# Patient Record
Sex: Female | Born: 1975 | Race: White | Hispanic: No | Smoking: Current every day smoker
Health system: Southern US, Community
[De-identification: ages and names within clinical notes are randomized; demographics above are authoritative.]

## PROBLEM LIST (undated history)

## (undated) DIAGNOSIS — G4730 Sleep apnea, unspecified: Secondary | ICD-10-CM

## (undated) DIAGNOSIS — K635 Polyp of colon: Secondary | ICD-10-CM

## (undated) DIAGNOSIS — M129 Arthropathy, unspecified: Secondary | ICD-10-CM

## (undated) DIAGNOSIS — R110 Nausea: Secondary | ICD-10-CM

## (undated) DIAGNOSIS — K648 Other hemorrhoids: Secondary | ICD-10-CM

## (undated) DIAGNOSIS — M5430 Sciatica, unspecified side: Secondary | ICD-10-CM

## (undated) DIAGNOSIS — F419 Anxiety disorder, unspecified: Secondary | ICD-10-CM

## (undated) DIAGNOSIS — R569 Unspecified convulsions: Secondary | ICD-10-CM

## (undated) DIAGNOSIS — D126 Benign neoplasm of colon, unspecified: Secondary | ICD-10-CM

## (undated) DIAGNOSIS — T83110A Breakdown (mechanical) of urinary electronic stimulator device, initial encounter: Secondary | ICD-10-CM

## (undated) DIAGNOSIS — K625 Hemorrhage of anus and rectum: Secondary | ICD-10-CM

## (undated) DIAGNOSIS — J449 Chronic obstructive pulmonary disease, unspecified: Secondary | ICD-10-CM

## (undated) DIAGNOSIS — N200 Calculus of kidney: Secondary | ICD-10-CM

## (undated) DIAGNOSIS — K859 Acute pancreatitis without necrosis or infection, unspecified: Secondary | ICD-10-CM

## (undated) DIAGNOSIS — N3010 Interstitial cystitis (chronic) without hematuria: Secondary | ICD-10-CM

## (undated) DIAGNOSIS — E079 Disorder of thyroid, unspecified: Secondary | ICD-10-CM

## (undated) DIAGNOSIS — F1320 Sedative, hypnotic or anxiolytic dependence, uncomplicated: Secondary | ICD-10-CM

## (undated) DIAGNOSIS — G43909 Migraine, unspecified, not intractable, without status migrainosus: Secondary | ICD-10-CM

## (undated) DIAGNOSIS — F1910 Other psychoactive substance abuse, uncomplicated: Secondary | ICD-10-CM

## (undated) DIAGNOSIS — F319 Bipolar disorder, unspecified: Secondary | ICD-10-CM

## (undated) DIAGNOSIS — M797 Fibromyalgia: Secondary | ICD-10-CM

## (undated) DIAGNOSIS — F329 Major depressive disorder, single episode, unspecified: Secondary | ICD-10-CM

## (undated) DIAGNOSIS — R112 Nausea with vomiting, unspecified: Secondary | ICD-10-CM

## (undated) DIAGNOSIS — M1990 Unspecified osteoarthritis, unspecified site: Secondary | ICD-10-CM

## (undated) DIAGNOSIS — F32A Depression, unspecified: Secondary | ICD-10-CM

## (undated) DIAGNOSIS — E785 Hyperlipidemia, unspecified: Secondary | ICD-10-CM

## (undated) DIAGNOSIS — K56609 Unspecified intestinal obstruction, unspecified as to partial versus complete obstruction: Secondary | ICD-10-CM

## (undated) DIAGNOSIS — Z87442 Personal history of urinary calculi: Secondary | ICD-10-CM

## (undated) DIAGNOSIS — D649 Anemia, unspecified: Secondary | ICD-10-CM

## (undated) DIAGNOSIS — G8929 Other chronic pain: Secondary | ICD-10-CM

## (undated) DIAGNOSIS — K589 Irritable bowel syndrome without diarrhea: Secondary | ICD-10-CM

## (undated) DIAGNOSIS — I10 Essential (primary) hypertension: Secondary | ICD-10-CM

## (undated) DIAGNOSIS — Z9889 Other specified postprocedural states: Secondary | ICD-10-CM

## (undated) DIAGNOSIS — IMO0001 Reserved for inherently not codable concepts without codable children: Secondary | ICD-10-CM

## (undated) DIAGNOSIS — K602 Anal fissure, unspecified: Secondary | ICD-10-CM

## (undated) DIAGNOSIS — N858 Other specified noninflammatory disorders of uterus: Secondary | ICD-10-CM

## (undated) DIAGNOSIS — K219 Gastro-esophageal reflux disease without esophagitis: Secondary | ICD-10-CM

## (undated) DIAGNOSIS — J42 Unspecified chronic bronchitis: Secondary | ICD-10-CM

## (undated) DIAGNOSIS — C801 Malignant (primary) neoplasm, unspecified: Secondary | ICD-10-CM

## (undated) DIAGNOSIS — Z86010 Personal history of colonic polyps: Secondary | ICD-10-CM

## (undated) DIAGNOSIS — N809 Endometriosis, unspecified: Secondary | ICD-10-CM

## (undated) HISTORY — DX: Unspecified intestinal obstruction, unspecified as to partial versus complete obstruction: K56.609

## (undated) HISTORY — PX: OTHER SURGICAL HISTORY: SHX169

## (undated) HISTORY — DX: Personal history of colonic polyps: Z86.010

## (undated) HISTORY — DX: Unspecified chronic bronchitis: J42

## (undated) HISTORY — DX: Reserved for inherently not codable concepts without codable children: IMO0001

## (undated) HISTORY — DX: Anal fissure, unspecified: K60.2

## (undated) HISTORY — DX: Sleep apnea, unspecified: G47.30

## (undated) HISTORY — DX: Anxiety disorder, unspecified: F41.9

## (undated) HISTORY — DX: Unspecified convulsions: R56.9

## (undated) HISTORY — DX: Sedative, hypnotic or anxiolytic dependence, uncomplicated: F13.20

## (undated) HISTORY — DX: Bipolar disorder, unspecified: F31.9

## (undated) HISTORY — DX: Polyp of colon: K63.5

## (undated) HISTORY — PX: COLONOSCOPY: SHX174

## (undated) HISTORY — DX: Hemorrhage of anus and rectum: K62.5

## (undated) HISTORY — DX: Hyperlipidemia, unspecified: E78.5

## (undated) HISTORY — DX: Gastro-esophageal reflux disease without esophagitis: K21.9

## (undated) HISTORY — DX: Unspecified osteoarthritis, unspecified site: M19.90

## (undated) HISTORY — DX: Endometriosis, unspecified: N80.9

## (undated) HISTORY — DX: Benign neoplasm of colon, unspecified: D12.6

## (undated) HISTORY — PX: PACEMAKER INSERTION: SHX728

## (undated) HISTORY — DX: Depression, unspecified: F32.A

## (undated) HISTORY — DX: Interstitial cystitis (chronic) without hematuria: N30.10

## (undated) HISTORY — DX: Other specified noninflammatory disorders of uterus: N85.8

## (undated) HISTORY — DX: Irritable bowel syndrome without diarrhea: K58.9

## (undated) HISTORY — PX: CHOLECYSTECTOMY: SHX55

## (undated) HISTORY — DX: Arthropathy, unspecified: M12.9

## (undated) HISTORY — DX: Other hemorrhoids: K64.8

## (undated) HISTORY — DX: Major depressive disorder, single episode, unspecified: F32.9

## (undated) HISTORY — PX: ABDOMINAL HYSTERECTOMY: SHX81

## (undated) HISTORY — PX: PEG TUBE PLACEMENT: SUR1034

## (undated) HISTORY — DX: Anemia, unspecified: D64.9

## (undated) HISTORY — DX: Essential (primary) hypertension: I10

## (undated) HISTORY — DX: Calculus of kidney: N20.0

## (undated) HISTORY — PX: BLADDER SURGERY: SHX569

---

## 2001-05-10 ENCOUNTER — Encounter: Payer: Self-pay | Admitting: Internal Medicine

## 2001-05-10 ENCOUNTER — Emergency Department (HOSPITAL_COMMUNITY): Admission: EM | Admit: 2001-05-10 | Discharge: 2001-05-11 | Payer: Self-pay | Admitting: Internal Medicine

## 2001-08-12 ENCOUNTER — Emergency Department (HOSPITAL_COMMUNITY): Admission: EM | Admit: 2001-08-12 | Discharge: 2001-08-12 | Payer: Self-pay | Admitting: *Deleted

## 2002-09-07 ENCOUNTER — Emergency Department (HOSPITAL_COMMUNITY): Admission: EM | Admit: 2002-09-07 | Discharge: 2002-09-07 | Payer: Self-pay | Admitting: Emergency Medicine

## 2002-09-07 ENCOUNTER — Encounter: Payer: Self-pay | Admitting: Emergency Medicine

## 2003-02-11 ENCOUNTER — Emergency Department (HOSPITAL_COMMUNITY): Admission: EM | Admit: 2003-02-11 | Discharge: 2003-02-11 | Payer: Self-pay | Admitting: Emergency Medicine

## 2003-11-13 ENCOUNTER — Ambulatory Visit (HOSPITAL_COMMUNITY): Admission: RE | Admit: 2003-11-13 | Discharge: 2003-11-13 | Payer: Self-pay | Admitting: Family Medicine

## 2003-12-24 ENCOUNTER — Other Ambulatory Visit: Admission: RE | Admit: 2003-12-24 | Discharge: 2003-12-24 | Payer: Self-pay | Admitting: *Deleted

## 2004-03-13 ENCOUNTER — Emergency Department (HOSPITAL_COMMUNITY): Admission: EM | Admit: 2004-03-13 | Discharge: 2004-03-13 | Payer: Self-pay | Admitting: Emergency Medicine

## 2004-06-28 ENCOUNTER — Ambulatory Visit (HOSPITAL_COMMUNITY): Admission: RE | Admit: 2004-06-28 | Discharge: 2004-06-28 | Payer: Self-pay | Admitting: Family Medicine

## 2004-08-16 ENCOUNTER — Other Ambulatory Visit: Admission: RE | Admit: 2004-08-16 | Discharge: 2004-08-16 | Payer: Self-pay | Admitting: *Deleted

## 2004-11-15 ENCOUNTER — Emergency Department (HOSPITAL_COMMUNITY): Admission: EM | Admit: 2004-11-15 | Discharge: 2004-11-15 | Payer: Self-pay | Admitting: Emergency Medicine

## 2004-12-14 ENCOUNTER — Encounter (INDEPENDENT_AMBULATORY_CARE_PROVIDER_SITE_OTHER): Payer: Self-pay | Admitting: Specialist

## 2004-12-14 ENCOUNTER — Inpatient Hospital Stay (HOSPITAL_COMMUNITY): Admission: AD | Admit: 2004-12-14 | Discharge: 2004-12-16 | Payer: Self-pay | Admitting: *Deleted

## 2005-05-16 ENCOUNTER — Ambulatory Visit (HOSPITAL_BASED_OUTPATIENT_CLINIC_OR_DEPARTMENT_OTHER): Admission: RE | Admit: 2005-05-16 | Discharge: 2005-05-16 | Payer: Self-pay | Admitting: Urology

## 2005-05-16 ENCOUNTER — Encounter (INDEPENDENT_AMBULATORY_CARE_PROVIDER_SITE_OTHER): Payer: Self-pay | Admitting: Specialist

## 2005-07-24 ENCOUNTER — Emergency Department (HOSPITAL_COMMUNITY): Admission: EM | Admit: 2005-07-24 | Discharge: 2005-07-24 | Payer: Self-pay | Admitting: Emergency Medicine

## 2005-09-21 ENCOUNTER — Other Ambulatory Visit: Admission: RE | Admit: 2005-09-21 | Discharge: 2005-09-21 | Payer: Self-pay | Admitting: *Deleted

## 2005-10-09 ENCOUNTER — Encounter (INDEPENDENT_AMBULATORY_CARE_PROVIDER_SITE_OTHER): Payer: Self-pay | Admitting: Specialist

## 2005-10-09 ENCOUNTER — Ambulatory Visit (HOSPITAL_COMMUNITY): Admission: RE | Admit: 2005-10-09 | Discharge: 2005-10-10 | Payer: Self-pay | Admitting: *Deleted

## 2005-12-22 ENCOUNTER — Encounter (INDEPENDENT_AMBULATORY_CARE_PROVIDER_SITE_OTHER): Payer: Self-pay | Admitting: Specialist

## 2005-12-22 ENCOUNTER — Ambulatory Visit (HOSPITAL_BASED_OUTPATIENT_CLINIC_OR_DEPARTMENT_OTHER): Admission: RE | Admit: 2005-12-22 | Discharge: 2005-12-22 | Payer: Self-pay | Admitting: Urology

## 2006-03-05 ENCOUNTER — Emergency Department (HOSPITAL_COMMUNITY): Admission: EM | Admit: 2006-03-05 | Discharge: 2006-03-05 | Payer: Self-pay | Admitting: Emergency Medicine

## 2006-03-08 ENCOUNTER — Ambulatory Visit (HOSPITAL_COMMUNITY): Admission: RE | Admit: 2006-03-08 | Discharge: 2006-03-08 | Payer: Self-pay | Admitting: *Deleted

## 2006-03-08 ENCOUNTER — Encounter (INDEPENDENT_AMBULATORY_CARE_PROVIDER_SITE_OTHER): Payer: Self-pay | Admitting: Specialist

## 2006-04-09 ENCOUNTER — Emergency Department (HOSPITAL_COMMUNITY): Admission: EM | Admit: 2006-04-09 | Discharge: 2006-04-09 | Payer: Self-pay | Admitting: Emergency Medicine

## 2006-07-06 ENCOUNTER — Ambulatory Visit: Payer: Self-pay | Admitting: Gastroenterology

## 2006-07-06 LAB — CONVERTED CEMR LAB
ALT: 10 units/L (ref 0–40)
AST: 17 units/L (ref 0–37)
Albumin: 4.1 g/dL (ref 3.5–5.2)
Alkaline Phosphatase: 68 units/L (ref 39–117)
BUN: 2 mg/dL — ABNORMAL LOW (ref 6–23)
Basophils Absolute: 0.1 10*3/uL (ref 0.0–0.1)
Basophils Relative: 0.8 % (ref 0.0–1.0)
Bilirubin, Direct: 0.1 mg/dL (ref 0.0–0.3)
CO2: 30 meq/L (ref 19–32)
CRP, High Sensitivity: 3 (ref 0.00–5.00)
Calcium: 9.3 mg/dL (ref 8.4–10.5)
Chloride: 106 meq/L (ref 96–112)
Creatinine, Ser: 0.6 mg/dL (ref 0.4–1.2)
Eosinophils Absolute: 0.1 10*3/uL (ref 0.0–0.6)
Eosinophils Relative: 1.6 % (ref 0.0–5.0)
Folate: 3.9 ng/mL
GFR calc Af Amer: 151 mL/min
GFR calc non Af Amer: 125 mL/min
Glucose, Bld: 89 mg/dL (ref 70–99)
HCT: 37.6 % (ref 36.0–46.0)
Hemoglobin: 13 g/dL (ref 12.0–15.0)
Lymphocytes Relative: 39.7 % (ref 12.0–46.0)
MCHC: 34.5 g/dL (ref 30.0–36.0)
MCV: 94.7 fL (ref 78.0–100.0)
Monocytes Absolute: 0.4 10*3/uL (ref 0.2–0.7)
Monocytes Relative: 5.9 % (ref 3.0–11.0)
Neutro Abs: 3.9 10*3/uL (ref 1.4–7.7)
Neutrophils Relative %: 52 % (ref 43.0–77.0)
Platelets: 281 10*3/uL (ref 150–400)
Potassium: 3.7 meq/L (ref 3.5–5.1)
RBC: 3.97 M/uL (ref 3.87–5.11)
RDW: 12 % (ref 11.5–14.6)
Sed Rate: 10 mm/hr (ref 0–25)
Sodium: 141 meq/L (ref 135–145)
TSH: 1.56 microintl units/mL (ref 0.35–5.50)
Tissue Transglutaminase Ab, IgA: <3 units (ref <5)
Total Bilirubin: 0.6 mg/dL (ref 0.3–1.2)
Total Protein: 6.9 g/dL (ref 6.0–8.3)
Vitamin B-12: 267 pg/mL (ref 211–911)
WBC: 7.4 10*3/uL (ref 4.5–10.5)

## 2006-07-24 ENCOUNTER — Ambulatory Visit: Payer: Self-pay | Admitting: Gastroenterology

## 2006-07-25 ENCOUNTER — Ambulatory Visit: Payer: Self-pay | Admitting: Gastroenterology

## 2006-07-25 ENCOUNTER — Encounter (INDEPENDENT_AMBULATORY_CARE_PROVIDER_SITE_OTHER): Payer: Self-pay | Admitting: Specialist

## 2006-07-25 DIAGNOSIS — Z860100 Personal history of colon polyps, unspecified: Secondary | ICD-10-CM

## 2006-07-25 HISTORY — DX: Personal history of colon polyps, unspecified: Z86.0100

## 2006-07-25 HISTORY — DX: Personal history of colonic polyps: Z86.010

## 2006-09-11 ENCOUNTER — Ambulatory Visit: Payer: Self-pay | Admitting: Gastroenterology

## 2006-11-16 ENCOUNTER — Ambulatory Visit (HOSPITAL_COMMUNITY): Admission: RE | Admit: 2006-11-16 | Discharge: 2006-11-16 | Payer: Self-pay | Admitting: Urology

## 2007-08-05 ENCOUNTER — Ambulatory Visit (HOSPITAL_COMMUNITY): Admission: RE | Admit: 2007-08-05 | Discharge: 2007-08-05 | Payer: Self-pay | Admitting: Family Medicine

## 2007-08-31 DIAGNOSIS — IMO0001 Reserved for inherently not codable concepts without codable children: Secondary | ICD-10-CM | POA: Insufficient documentation

## 2007-08-31 DIAGNOSIS — G4730 Sleep apnea, unspecified: Secondary | ICD-10-CM | POA: Insufficient documentation

## 2007-08-31 DIAGNOSIS — F319 Bipolar disorder, unspecified: Secondary | ICD-10-CM | POA: Insufficient documentation

## 2007-08-31 DIAGNOSIS — F329 Major depressive disorder, single episode, unspecified: Secondary | ICD-10-CM

## 2007-08-31 DIAGNOSIS — M129 Arthropathy, unspecified: Secondary | ICD-10-CM

## 2007-08-31 DIAGNOSIS — F1320 Sedative, hypnotic or anxiolytic dependence, uncomplicated: Secondary | ICD-10-CM | POA: Insufficient documentation

## 2007-08-31 DIAGNOSIS — D126 Benign neoplasm of colon, unspecified: Secondary | ICD-10-CM

## 2007-08-31 DIAGNOSIS — I10 Essential (primary) hypertension: Secondary | ICD-10-CM | POA: Insufficient documentation

## 2007-08-31 DIAGNOSIS — J438 Other emphysema: Secondary | ICD-10-CM | POA: Insufficient documentation

## 2007-08-31 DIAGNOSIS — N200 Calculus of kidney: Secondary | ICD-10-CM | POA: Insufficient documentation

## 2007-08-31 HISTORY — DX: Sleep apnea, unspecified: G47.30

## 2007-08-31 HISTORY — DX: Benign neoplasm of colon, unspecified: D12.6

## 2007-08-31 HISTORY — DX: Arthropathy, unspecified: M12.9

## 2007-08-31 HISTORY — DX: Essential (primary) hypertension: I10

## 2007-08-31 HISTORY — DX: Major depressive disorder, single episode, unspecified: F32.9

## 2007-08-31 HISTORY — DX: Reserved for inherently not codable concepts without codable children: IMO0001

## 2007-08-31 HISTORY — DX: Calculus of kidney: N20.0

## 2007-08-31 HISTORY — DX: Sedative, hypnotic or anxiolytic dependence, uncomplicated: F13.20

## 2008-03-24 ENCOUNTER — Emergency Department (HOSPITAL_COMMUNITY): Admission: EM | Admit: 2008-03-24 | Discharge: 2008-03-25 | Payer: Self-pay | Admitting: Emergency Medicine

## 2008-09-03 ENCOUNTER — Encounter (HOSPITAL_COMMUNITY): Admission: RE | Admit: 2008-09-03 | Discharge: 2008-10-07 | Payer: Self-pay | Admitting: Family Medicine

## 2008-10-14 ENCOUNTER — Encounter (HOSPITAL_COMMUNITY): Admission: RE | Admit: 2008-10-14 | Discharge: 2008-11-13 | Payer: Self-pay | Admitting: Family Medicine

## 2008-11-23 ENCOUNTER — Emergency Department (HOSPITAL_COMMUNITY): Admission: EM | Admit: 2008-11-23 | Discharge: 2008-11-23 | Payer: Self-pay | Admitting: Emergency Medicine

## 2008-12-04 ENCOUNTER — Ambulatory Visit: Payer: Self-pay | Admitting: Internal Medicine

## 2008-12-04 DIAGNOSIS — F17200 Nicotine dependence, unspecified, uncomplicated: Secondary | ICD-10-CM | POA: Insufficient documentation

## 2008-12-04 DIAGNOSIS — J4520 Mild intermittent asthma, uncomplicated: Secondary | ICD-10-CM | POA: Insufficient documentation

## 2008-12-07 ENCOUNTER — Telehealth: Payer: Self-pay | Admitting: Internal Medicine

## 2009-01-04 ENCOUNTER — Encounter: Payer: Self-pay | Admitting: Pulmonary Disease

## 2009-01-04 ENCOUNTER — Ambulatory Visit: Payer: Self-pay | Admitting: Internal Medicine

## 2009-01-05 ENCOUNTER — Encounter: Payer: Self-pay | Admitting: Internal Medicine

## 2009-02-05 ENCOUNTER — Ambulatory Visit: Payer: Self-pay | Admitting: Internal Medicine

## 2009-02-05 DIAGNOSIS — K219 Gastro-esophageal reflux disease without esophagitis: Secondary | ICD-10-CM | POA: Insufficient documentation

## 2009-02-05 HISTORY — DX: Gastro-esophageal reflux disease without esophagitis: K21.9

## 2009-02-14 ENCOUNTER — Emergency Department (HOSPITAL_COMMUNITY): Admission: EM | Admit: 2009-02-14 | Discharge: 2009-02-14 | Payer: Self-pay | Admitting: Emergency Medicine

## 2009-02-19 ENCOUNTER — Encounter: Payer: Self-pay | Admitting: Internal Medicine

## 2009-03-11 ENCOUNTER — Ambulatory Visit: Payer: Self-pay | Admitting: Gastroenterology

## 2009-03-11 DIAGNOSIS — K589 Irritable bowel syndrome without diarrhea: Secondary | ICD-10-CM

## 2009-03-11 DIAGNOSIS — N3010 Interstitial cystitis (chronic) without hematuria: Secondary | ICD-10-CM

## 2009-03-11 DIAGNOSIS — K582 Mixed irritable bowel syndrome: Secondary | ICD-10-CM | POA: Insufficient documentation

## 2009-03-11 DIAGNOSIS — Z860100 Personal history of colon polyps, unspecified: Secondary | ICD-10-CM | POA: Insufficient documentation

## 2009-03-11 DIAGNOSIS — K625 Hemorrhage of anus and rectum: Secondary | ICD-10-CM | POA: Insufficient documentation

## 2009-03-11 DIAGNOSIS — K602 Anal fissure, unspecified: Secondary | ICD-10-CM

## 2009-03-11 HISTORY — DX: Interstitial cystitis (chronic) without hematuria: N30.10

## 2009-03-11 HISTORY — DX: Anal fissure, unspecified: K60.2

## 2009-03-11 HISTORY — DX: Hemorrhage of anus and rectum: K62.5

## 2009-03-11 HISTORY — DX: Irritable bowel syndrome, unspecified: K58.9

## 2009-03-11 LAB — CONVERTED CEMR LAB: Tissue Transglutaminase Ab, IgA: 0.4 units (ref <7)

## 2009-03-12 LAB — CONVERTED CEMR LAB
ALT: 16 units/L (ref 0–35)
AST: 26 units/L (ref 0–37)
Albumin: 3.5 g/dL (ref 3.5–5.2)
Alkaline Phosphatase: 110 units/L (ref 39–117)
BUN: 5 mg/dL — ABNORMAL LOW (ref 6–23)
Basophils Absolute: 0 10*3/uL (ref 0.0–0.1)
Basophils Relative: 0.4 % (ref 0.0–3.0)
Bilirubin, Direct: 0 mg/dL (ref 0.0–0.3)
CO2: 30 meq/L (ref 19–32)
Calcium: 8.7 mg/dL (ref 8.4–10.5)
Chloride: 105 meq/L (ref 96–112)
Creatinine, Ser: 0.6 mg/dL (ref 0.4–1.2)
Eosinophils Absolute: 0.2 10*3/uL (ref 0.0–0.7)
Eosinophils Relative: 1.7 % (ref 0.0–5.0)
Ferritin: 37.3 ng/mL (ref 10.0–291.0)
Folate: 3.1 ng/mL
GFR calc non Af Amer: 122.46 mL/min (ref >60)
Glucose, Bld: 91 mg/dL (ref 70–99)
HCT: 35.9 % — ABNORMAL LOW (ref 36.0–46.0)
Hemoglobin: 12.2 g/dL (ref 12.0–15.0)
IgA: 245 mg/dL (ref 68–378)
Iron: 50 ug/dL (ref 42–145)
Lymphocytes Relative: 30.7 % (ref 12.0–46.0)
Lymphs Abs: 3.2 10*3/uL (ref 0.7–4.0)
MCHC: 34 g/dL (ref 30.0–36.0)
MCV: 101.9 fL — ABNORMAL HIGH (ref 78.0–100.0)
Monocytes Absolute: 0.5 10*3/uL (ref 0.1–1.0)
Monocytes Relative: 4.7 % (ref 3.0–12.0)
Neutro Abs: 6.6 10*3/uL (ref 1.4–7.7)
Neutrophils Relative %: 62.5 % (ref 43.0–77.0)
Platelets: 310 10*3/uL (ref 150.0–400.0)
Potassium: 3.5 meq/L (ref 3.5–5.1)
RBC: 3.53 M/uL — ABNORMAL LOW (ref 3.87–5.11)
RDW: 12 % (ref 11.5–14.6)
Saturation Ratios: 22.7 % (ref 20.0–50.0)
Sed Rate: 25 mm/hr — ABNORMAL HIGH (ref 0–22)
Sodium: 142 meq/L (ref 135–145)
TSH: 1.24 microintl units/mL (ref 0.35–5.50)
Total Bilirubin: 0.2 mg/dL — ABNORMAL LOW (ref 0.3–1.2)
Total Protein: 6.8 g/dL (ref 6.0–8.3)
Transferrin: 157.6 mg/dL — ABNORMAL LOW (ref 212.0–360.0)
Vitamin B-12: 270 pg/mL (ref 211–911)
WBC: 10.5 10*3/uL (ref 4.5–10.5)

## 2009-03-16 ENCOUNTER — Encounter (HOSPITAL_COMMUNITY): Admission: RE | Admit: 2009-03-16 | Discharge: 2009-04-07 | Payer: Self-pay | Admitting: Family Medicine

## 2009-04-05 ENCOUNTER — Encounter: Payer: Self-pay | Admitting: Gastroenterology

## 2009-04-15 ENCOUNTER — Ambulatory Visit: Payer: Self-pay | Admitting: Gastroenterology

## 2009-04-15 ENCOUNTER — Ambulatory Visit (HOSPITAL_COMMUNITY): Admission: RE | Admit: 2009-04-15 | Discharge: 2009-04-15 | Payer: Self-pay | Admitting: Gastroenterology

## 2009-04-15 LAB — HM COLONOSCOPY

## 2009-04-16 ENCOUNTER — Encounter: Payer: Self-pay | Admitting: Gastroenterology

## 2009-05-06 ENCOUNTER — Ambulatory Visit: Payer: Self-pay | Admitting: Internal Medicine

## 2009-05-10 ENCOUNTER — Telehealth (INDEPENDENT_AMBULATORY_CARE_PROVIDER_SITE_OTHER): Payer: Self-pay | Admitting: *Deleted

## 2009-05-13 ENCOUNTER — Telehealth (INDEPENDENT_AMBULATORY_CARE_PROVIDER_SITE_OTHER): Payer: Self-pay | Admitting: *Deleted

## 2009-06-08 ENCOUNTER — Ambulatory Visit: Payer: Self-pay | Admitting: Family Medicine

## 2009-07-06 ENCOUNTER — Ambulatory Visit: Payer: Self-pay | Admitting: Family Medicine

## 2009-07-06 DIAGNOSIS — R5381 Other malaise: Secondary | ICD-10-CM | POA: Insufficient documentation

## 2009-07-06 DIAGNOSIS — R5383 Other fatigue: Secondary | ICD-10-CM

## 2009-07-08 ENCOUNTER — Telehealth (INDEPENDENT_AMBULATORY_CARE_PROVIDER_SITE_OTHER): Payer: Self-pay | Admitting: *Deleted

## 2009-07-13 ENCOUNTER — Encounter: Payer: Self-pay | Admitting: Family Medicine

## 2009-07-15 ENCOUNTER — Emergency Department (HOSPITAL_COMMUNITY): Admission: EM | Admit: 2009-07-15 | Discharge: 2009-07-15 | Payer: Self-pay | Admitting: Emergency Medicine

## 2009-07-16 ENCOUNTER — Encounter: Payer: Self-pay | Admitting: Family Medicine

## 2009-07-19 ENCOUNTER — Ambulatory Visit: Payer: Self-pay | Admitting: Family Medicine

## 2009-07-20 LAB — CONVERTED CEMR LAB
ALT: 10 units/L (ref 0–35)
AST: 18 units/L (ref 0–37)
Albumin: 3.9 g/dL (ref 3.5–5.2)
Alkaline Phosphatase: 90 units/L (ref 39–117)
BUN: 3 mg/dL — ABNORMAL LOW (ref 6–23)
Basophils Absolute: 0 10*3/uL (ref 0.0–0.1)
Basophils Relative: 0.3 % (ref 0.0–3.0)
Bilirubin, Direct: 0 mg/dL (ref 0.0–0.3)
CO2: 29 meq/L (ref 19–32)
Calcium: 9.3 mg/dL (ref 8.4–10.5)
Chloride: 104 meq/L (ref 96–112)
Cholesterol: 127 mg/dL (ref 0–200)
Creatinine, Ser: 0.6 mg/dL (ref 0.4–1.2)
Eosinophils Absolute: 0.1 10*3/uL (ref 0.0–0.7)
Eosinophils Relative: 0.6 % (ref 0.0–5.0)
Folate: 16.9 ng/mL
GFR calc non Af Amer: 122.19 mL/min (ref >60)
Glucose, Bld: 70 mg/dL (ref 70–99)
HCT: 38.4 % (ref 36.0–46.0)
HDL: 42.8 mg/dL (ref >39.00)
Hemoglobin: 13 g/dL (ref 12.0–15.0)
LDL Cholesterol: 58 mg/dL (ref 0–99)
Lymphocytes Relative: 45.5 % (ref 12.0–46.0)
Lymphs Abs: 3.8 10*3/uL (ref 0.7–4.0)
MCHC: 34 g/dL (ref 30.0–36.0)
MCV: 98 fL (ref 78.0–100.0)
Monocytes Absolute: 0.4 10*3/uL (ref 0.1–1.0)
Monocytes Relative: 4.5 % (ref 3.0–12.0)
Neutro Abs: 4.1 10*3/uL (ref 1.4–7.7)
Neutrophils Relative %: 49.1 % (ref 43.0–77.0)
Platelets: 329 10*3/uL (ref 150.0–400.0)
Potassium: 3.6 meq/L (ref 3.5–5.1)
RBC: 3.92 M/uL (ref 3.87–5.11)
RDW: 13.6 % (ref 11.5–14.6)
Sodium: 142 meq/L (ref 135–145)
TSH: 1.46 microintl units/mL (ref 0.35–5.50)
Total Bilirubin: 0.4 mg/dL (ref 0.3–1.2)
Total CHOL/HDL Ratio: 3
Total Protein: 7.6 g/dL (ref 6.0–8.3)
Triglycerides: 130 mg/dL (ref 0.0–149.0)
VLDL: 26 mg/dL (ref 0.0–40.0)
Vitamin B-12: 398 pg/mL (ref 211–911)
WBC: 8.4 10*3/uL (ref 4.5–10.5)

## 2009-07-21 LAB — CONVERTED CEMR LAB: Vit D, 25-Hydroxy: 36 ng/mL (ref 30–89)

## 2009-08-04 ENCOUNTER — Ambulatory Visit: Payer: Self-pay | Admitting: Internal Medicine

## 2009-08-04 DIAGNOSIS — J069 Acute upper respiratory infection, unspecified: Secondary | ICD-10-CM | POA: Insufficient documentation

## 2009-08-20 ENCOUNTER — Ambulatory Visit: Payer: Self-pay | Admitting: Internal Medicine

## 2009-08-23 ENCOUNTER — Encounter: Payer: Self-pay | Admitting: Family Medicine

## 2009-08-23 DIAGNOSIS — J42 Unspecified chronic bronchitis: Secondary | ICD-10-CM

## 2009-08-23 HISTORY — DX: Unspecified chronic bronchitis: J42

## 2009-08-26 ENCOUNTER — Encounter: Admission: RE | Admit: 2009-08-26 | Discharge: 2009-08-26 | Payer: Self-pay | Admitting: Neurology

## 2009-09-21 ENCOUNTER — Telehealth: Payer: Self-pay | Admitting: Internal Medicine

## 2009-10-26 ENCOUNTER — Ambulatory Visit: Payer: Self-pay | Admitting: Family Medicine

## 2009-10-26 DIAGNOSIS — H6690 Otitis media, unspecified, unspecified ear: Secondary | ICD-10-CM | POA: Insufficient documentation

## 2009-10-28 LAB — CONVERTED CEMR LAB
Free T4: 0.6 ng/dL (ref 0.60–1.60)
T3, Free: 3.1 pg/mL (ref 2.3–4.2)
TSH: 0.62 microintl units/mL (ref 0.35–5.50)

## 2009-11-02 ENCOUNTER — Ambulatory Visit: Payer: Self-pay | Admitting: Internal Medicine

## 2009-11-18 ENCOUNTER — Encounter: Payer: Self-pay | Admitting: Family Medicine

## 2009-12-09 ENCOUNTER — Encounter: Payer: Self-pay | Admitting: Family Medicine

## 2009-12-22 ENCOUNTER — Encounter: Payer: Self-pay | Admitting: Family Medicine

## 2010-01-11 ENCOUNTER — Encounter (HOSPITAL_COMMUNITY): Admission: RE | Admit: 2010-01-11 | Discharge: 2010-02-10 | Payer: Self-pay | Admitting: Family Medicine

## 2010-01-11 ENCOUNTER — Encounter: Payer: Self-pay | Admitting: Family Medicine

## 2010-02-03 ENCOUNTER — Encounter: Payer: Self-pay | Admitting: Family Medicine

## 2010-03-01 ENCOUNTER — Telehealth (INDEPENDENT_AMBULATORY_CARE_PROVIDER_SITE_OTHER): Payer: Self-pay | Admitting: *Deleted

## 2010-03-02 ENCOUNTER — Encounter (HOSPITAL_COMMUNITY)
Admission: RE | Admit: 2010-03-02 | Discharge: 2010-04-01 | Payer: Self-pay | Source: Home / Self Care | Attending: Family Medicine | Admitting: Family Medicine

## 2010-03-07 ENCOUNTER — Encounter: Payer: Self-pay | Admitting: Family Medicine

## 2010-04-19 ENCOUNTER — Encounter: Payer: Self-pay | Admitting: Family Medicine

## 2010-04-22 ENCOUNTER — Encounter: Payer: Self-pay | Admitting: Family Medicine

## 2010-05-04 ENCOUNTER — Ambulatory Visit
Admission: RE | Admit: 2010-05-04 | Discharge: 2010-05-04 | Payer: Self-pay | Source: Home / Self Care | Attending: Family Medicine | Admitting: Family Medicine

## 2010-05-04 ENCOUNTER — Ambulatory Visit (HOSPITAL_BASED_OUTPATIENT_CLINIC_OR_DEPARTMENT_OTHER)
Admission: RE | Admit: 2010-05-04 | Discharge: 2010-05-04 | Payer: Self-pay | Source: Home / Self Care | Attending: Family Medicine | Admitting: Family Medicine

## 2010-05-04 DIAGNOSIS — R079 Chest pain, unspecified: Secondary | ICD-10-CM | POA: Insufficient documentation

## 2010-05-04 DIAGNOSIS — R1012 Left upper quadrant pain: Secondary | ICD-10-CM | POA: Insufficient documentation

## 2010-05-06 ENCOUNTER — Ambulatory Visit: Payer: Self-pay | Admitting: Cardiovascular Disease

## 2010-05-10 NOTE — Op Note (Signed)
Summary: Cystoscopy/[facility]  Cystoscopy/[facility]   Imported By: Lanelle Bal 03/12/2010 10:55:01  _____________________________________________________________________  External Attachment:    Type:   Image     Comment:   External Document

## 2010-05-10 NOTE — Assessment & Plan Note (Signed)
Summary: Pulmonary/ acute ext ov with HFA 75%   Copy to:  [name], MD Primary Provider/Referring Provider:  [name]  CC:  Asthma follow-up.  The Orozco c/o increased SOB with exertion and cough with yellow mucus and wheezing x3-4 days.[name]  History of Present Illness: 35  yowf active smoker no previous chronic resp problems until dx  with AB 2006 and since then using proaire qid as a maintenance med.  December 04, 2008 ov c/w 6 weeks indolent onset progressively worse sensation of wheezing and sob and chest tightness just on left side not much cough, some nausea assoc with hoarsness , worst first thing in am and when lies down.  rec stop smoking and takeSymbicort 160/4.5 2 puffs first thing  in am and 2 puffs again in pm about 12 hours later and fill the prescription if doing better  January 04, 2009 ov cut down on smoking and liked symbicort much less need for saba 1 month followup.  Pt states that she still has alot of wheezing.  She also c/o SOB and chest tightness in the am and states that she gets very dizzy worse  when she tries to do any activity.  rec add dexilant and found that everything was fine until ran out of dexilant   February 05, 2009 ov 3 wk followup.  Pt states that her breathing was doing much better until she ran out of dexilant.  She states that she has been out for 5 days and has noticed incxreased SOB- same as before.  She c/o "constantly nauseas" and "can not keep anything down" x 1 wk.   while on dexilant much better but still required saba 2 -3 x daily and not able to stop smoking.    May 06, 2009--Presents for a 3 month follow up - states breathing was better when she was taking the dexilant rather than the nexium, symbicort has been working well.  has prod cough with clear mucus, chest congestion, increased dyspnea, wheezing x1week. Wants to change back to dexilant. Doing well until last week , with thick mucus. rec change to dexilant, short cycle of pred >  better  August 04, 2009  c/o increased SOB with exertion and cough with yellow mucus and wheezing x3-4 days.Pt denies any significant sore throat, nasal congestion or excess secretions, fever, chills, sweats, unintended wt loss, pleuritic or exertional cp, orthopnea pnd or leg swelling.  better with saba  Current Medications (verified): 1)  Symbicort 160-4.5 Mcg/act  Aero (Budesonide-Formoterol Fumarate) .... 2 Puffs First Thing  in Am and 2 Puffs Again in Pm About 12 Hours Later 2)  Vivelle-Dot 0.1 Mg/24hr Pttw (Estradiol) .... Change Patch Every 3 Days 3)  Doxazosin Mesylate 4 Mg Tabs (Doxazosin Mesylate) .[name].. 1 At Bedtime 4)  Amitriptyline Hcl 25 Mg Tabs (Amitriptyline Hcl) .... Take 1 Tab By Mouth At Bedtime 5)  Seroquel Xr 300 Mg Xr24h-Tab (Quetiapine Fumarate) .[name].. 1 By Mouth At Bedtime 6)  Folic Acid 1 Mg Tabs (Folic Acid) .[name].. 1 By Mouth Daily 7)  Lexapro 10 Mg Tabs (Escitalopram Oxalate) .... Take 1 Tablet By Mouth Once A Day 8)  Promethazine Hcl 25 Mg Tabs (Promethazine Hcl) .... As Needed 9)  Alprazolam 0.5 Mg Tabs (Alprazolam) .[name].. 1 Three Times A Day As Needed 10)  Flexeril 5 Mg Tabs (Cyclobenzaprine Hcl) .... As Needed 11)  Anamantle Hc 3-0.5 % Crea (Lidocaine-Hydrocortisone Ace) .... Aply To Rectal Area Twice Daily 12)  Estring 2 Mg Ring (Estradiol) .... As  Directed 13)  Lidocaine Hcl 2 % Soln (Lidocaine Hcl (Local Anesth.)) .... As Directed By Dr. Logan Bores 14)  Sodium Bicarbonate 8.4 % Soln (Sodium Bicarbonate) .... As Directed By Dr. Logan Bores 15)  Heparin Lock Flush 100 Unit/ml Soln (Heparin Sodium (Porcine)) .... As Directed By Dr. Logan Bores 16)  Librax 2.5-5 Mg Caps (Clidinium-Chlordiazepoxide) .[name].. 1 By Mouth Three Times A Day Prn 17)  Diazepam 5 Mg Tabs (Diazepam) .[name].. 1 By Mouth Three Times A Day As Needed 18)  Proair Hfa 108 (90 Base) Mcg/act  Aers (Albuterol Sulfate) .[name].. 1-2 Puffs Every 4-6 Hours As Needed If Not Breathing Well 19)  Prednisone 10 Mg  Tabs (Prednisone) .... 4 Each  Am X 2days, 2x2days, 1x2days and Stop 20)  Doxycycline Hyclate 100 Mg Caps (Doxycycline Hyclate) .... One Twice Daily  Allergies (verified): 1)  ! * Ambien 2)  ! Reglan 3)  ! 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) FIBROMYALGIA (ICD-729.1) ADENOMATOUS COLONIC POLYP (ICD-211.3) SLEEP APNEA (ICD-780.57) NEPHROLITHIASIS (ICD-592.0) DEPRESSION (ICD-311) ARTHRITIS (ICD-716.90) HYPERTENSION (ICD-401.9) Asthmatic Bronchitis..............................................................Lindsay KitchenWert    - HFA 75%  December 04, 2008 > 75% February 05, 2009 > 75 % August 04, 2009     - PFT's January 04, 2009  2.56 (86%)  ratio 75,  no resp to B2 and DLC0 67% > 80after correction Interstitial Cystitis  Vital Signs:  Orozco profile:   35 year old female Height:      65 inches (165.10 cm) Weight:      114 pounds (51.82 kg) BMI:     19.04 O2 Sat:      97 % on Room air Temp:     97.6 degrees F (36.44 degrees C) oral Pulse rate:   106 / minute BP sitting:   108 / 70  (right arm) Cuff size:   regular  Vitals Entered By: Michel Bickers CMA (August 04, 2009 9:59 AM)  O2 Sat at Rest %:  97 O2 Flow:  Room air CC: Asthma follow-up.  The Orozco c/o increased SOB with exertion and cough with yellow mucus and wheezing x3-4 days. Comments Medications reviewed with the Orozco. Daytime phone number verified. Michel Bickers [facility]  August 04, 2009 10:00 AM   Physical Exam  Additional Exam:  amb wf nad wt 109 > 110 January 04, 2009 > 107 February 05, 2009 >> 114 August 04, 2009  HEENT mild turbinate edema.  Oropharynx no thrush or excess pnd or cobblestoning.  No JVD or cervical adenopathy. Mild accessory muscle hypertrophy. Trachea midline, nl thryroid. Chest coarse BS w/ faint wheeze.   Regular rate and rhythm without murmur gallop or rub or increase P2 or edema.  Abd: no hsm, nl excursion. Ext warm without cyanosis or clubbing.     Impression &  Recommendations:  Problem # 1:  ASTHMA (ICD-493.90) I spent extra time with the Orozco today explaining optimal mdi  technique.  This improved from  50-75%  DDX of  difficult airways managment all start with A and  include Adherence, Ace Inhibitors, Acid Reflux, Active Sinus Disease, Alpha 1 Antitripsin deficiency, Anxiety masquerading as Airways dz,  ABPA,  allergy(esp in young), Aspiration (esp in elderly), Adverse effects of DPI,  Active smokers, plus one B  = Beta blocker use..   Adherence and active smoking appear to be the biggest issues here.   Each maintenance medication was reviewed in detail including most importantly the difference between maintenance and as needed and under what circumstances the prns are  to be used. She is struggling with concept of med reconciliation.   To keep things simple, I have asked the Orozco to first separate medicines that are perceived as maintenance, that is to be taken daily "no matter what", from those medicines that are taken on only on an as-needed basis and I have given the Orozco examples of both, and then return to see our NP to generate a  detailed  medication calendar which should be followed until the next physician sees the Orozco and updates it.   Once we're sure that we're all reading from the same page in terms of medication admiistration, she needs to be scheduled to follow up with me   Problem # 2:  URI, ACUTE (ICD-465.9)  Explained natural h/o uri and why it's necessary in patients at risk to rx short term with PPI to reduce risk of evolving cyclical cough triggered by epithelial injury and a heightened sensitivty to the effects of any upper airway irritants,  most importantly acid - related  See instructions for specific recommendations   Orders: Est. Orozco Level IV ([address])  Problem # 3:  SMOKER (ICD-305.1) Discussed but not ready to committ to quit at this point - emphasized risks involved in continuing smoking and that Orozco  should consider these in the context of the cost of smoking relative to the benefit obtained.  I took this opportunity to educate the Orozco regarding the consequences of smoking in airway disorders based on all the data we have from the multiple national lung health studies indicating that smoking cessation, not choice of inhalers or physicians, is the most important aspect of care.    Medications Added to Medication List This Visit: 1)  Folic Acid 1 Mg Tabs (Folic acid) .[name].. 1 by mouth daily 2)  Anamantle Hc 3-0.5 % Crea (Lidocaine-hydrocortisone ace) .... Aply to rectal area twice daily 3)  Estring 2 Mg Ring (Estradiol) .... As directed 4)  Diazepam 5 Mg Tabs (Diazepam) .[name].. 1 by mouth three times a day as needed 5)  Prednisone 10 Mg Tabs (Prednisone) .... 4 each am x 2days, 2x2days, 1x2days and stop 6)  Doxycycline Hyclate 100 Mg Caps (Doxycycline hyclate) .... One twice daily  Orozco Instructions: 1)  Prednisone 4 each am x 2days, 2x2days, 1x2days and stop 2)  Work on inhaler technique:  relax and blow all the way out then take a nice smooth deep breath back in, triggering the inhaler at same time you start breathing in 3)  See [name] w/in 2 weeks with all your medications, even over the counter meds, separated in two separate bags, the ones you take no matter what vs the ones you stop once you feel better and take only as needed.  She will generate for you a new user friendly medication calendar that will put [place] all on the same page re: your medication use.  4)  if mucus stays nasty, sore throat or fever, go ahead a cycle of doxycycline 100 twice daily x 7days Prescriptions: DOXYCYCLINE HYCLATE 100 MG CAPS (DOXYCYCLINE HYCLATE) one twice daily  #14 x 0   Entered and Authorized by:   Nyoka Cowden MD   Signed by:   Nyoka Cowden MD on 08/04/2009   Method used:   Electronically to        [facility] (retail)       [address] [facility]  [place], [place]   [address]       Ph: [phone]       Fax: [phone]   RxID:   [id number] PREDNISONE 10 MG  TABS (PREDNISONE) 4 each am x 2days, 2x2days, 1x2days and stop  #14 x 0   Entered and Authorized by:   Nyoka Cowden MD   Signed by:   Nyoka Cowden MD on 08/04/2009   Method used:   Electronically to        [facility] (retail)       [address]       [place], [place]  [address]       Ph: [phone]       Fax: [phone]   RxID:   [id number]

## 2010-05-10 NOTE — Medication Information (Signed)
Summary: Denial for Abilify/[facility]  Denial for Abilify/[facility]   Imported By: Lanelle Bal 07/16/2009 13:52:58  _____________________________________________________________________  External Attachment:    Type:   Image     Comment:   External Document

## 2010-05-10 NOTE — Assessment & Plan Note (Signed)
Summary: 2 WEEKS OV AND LABS//PH   Vital Signs:  Patient profile:   35 year old female Weight:      113 pounds Pulse rate:   82 / minute Pulse rhythm:   regular BP sitting:   112 / 80  (left arm) Cuff size:   regular  Vitals Entered By: Army Fossa CMA (July 19, 2009 10:43 AM) CC: Pt here for labwork, and to discuss Abilify opitions   History of Present Illness: Pt here to discuss abilify.  Her insurance will not pay for it.  She wants to know her options and would like labs as well.  Preventive Screening-Counseling & Management  Caffeine-Diet-Exercise     Caffeine use/day: 6  Current Medications (verified): 1)  Symbicort 160-4.5 Mcg/act  Aero (Budesonide-Formoterol Fumarate) .... 2 Puffs First Thing  in Am and 2 Puffs Again in Pm About 12 Hours Later 2)  Vivelle-Dot 0.1 Mg/24hr Pttw (Estradiol) .... Change Patch Every 3 Days 3)  Doxazosin Mesylate 4 Mg Tabs (Doxazosin Mesylate) .Marland Kitchen.. 1 At Bedtime 4)  Alprazolam 0.5 Mg Tabs (Alprazolam) .Marland Kitchen.. 1 Three Times A Day As Needed 5)  Amitriptyline Hcl 25 Mg Tabs (Amitriptyline Hcl) .... Take 1 Tab By Mouth At Bedtime 6)  Proair Hfa 108 (90 Base) Mcg/act  Aers (Albuterol Sulfate) .Marland Kitchen.. 1-2 Puffs Every 4-6 Hours As Needed If Not Breathing Well 7)  Promethazine Hcl 25 Mg Tabs (Promethazine Hcl) .... As Needed 8)  Flexeril 5 Mg Tabs (Cyclobenzaprine Hcl) .... As Needed 9)  Omeprazole 40 Mg Cpdr (Omeprazole) .... Once Daily 10)  Lexapro 10 Mg Tabs (Escitalopram Oxalate) .... Take 1 Tablet By Mouth Once A Day 11)  Librax 2.5-5 Mg Caps (Clidinium-Chlordiazepoxide) .Marland Kitchen.. 1 By Mouth Three Times A Day Prn 12)  Anamantle Hc 3-0.5 % Crea (Lidocaine-Hydrocortisone Ace) .... Aply To Rectal Area Bid 13)  Folic Acid 1 Mg Tabs (Folic Acid) .Marland Kitchen.. 1 By Mouth Qd 14)  Estrace 0.1 Mg/gm Crea (Estradiol) .... Apply Twice A Week 15)  Lidocaine Hcl 2 % Soln (Lidocaine Hcl (Local Anesth.)) .... As Directed By Dr. Logan Bores 16)  Sodium Bicarbonate 8.4 % Soln  (Sodium Bicarbonate) .... As Directed By Dr. Logan Bores 17)  Heparin Lock Flush 100 Unit/ml Soln (Heparin Sodium (Porcine)) .... As Directed By Dr. Logan Bores 18)  Cefdinir 300 Mg Caps (Cefdinir) .Marland Kitchen.. 1 By Mouth Two Times A Day 19)  Seroquel Xr 300 Mg Xr24h-Tab (Quetiapine Fumarate) .Marland Kitchen.. 1 By Mouth At Bedtime  Allergies: 1)  ! * Ambien 2)  ! Reglan 3)  ! 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.3) SLEEP APNEA (ICD-780.57) NEPHROLITHIASIS (ICD-592.0) DEPRESSION (ICD-311) ARTHRITIS (ICD-716.90) HYPERTENSION (ICD-401.9) Asthmatic Bronchitis............................................................Marland KitchenWert    - HFA 75%  December 04, 2008 > 75% February 05, 2009     - PFT's January 04, 2009  2.56 (86%)  ratio 75,  no resp to B2 and DLC0 67% > 80 Interstitial Cystitis  Past Surgical History: Last updated: 03/11/2009 hysterectomy cholecystectomy pacemaker in hip for interstitial cystitis  Family History: Last updated: 03/11/2009 Emphysema- MGF Asthma- MGM Heart dz- Father Lung CA- MGF, Aunt No FH of Colon Cancer:  Social History: Last updated: 05/06/2009 Married Children Current smoker since age 31.  Smokes 1/2 ppd. No ETOH Homemaker Daily Caffeine Use  Mt Dew occasional marajuana.   Risk Factors: Alcohol Use: 0 (07/06/2009) Caffeine Use: 6 (07/19/2009)  Risk Factors: Smoking Status: current (07/06/2009) Packs/Day: 0.5 (07/06/2009)  Family  History: Reviewed history from 03/11/2009 and no changes required. Emphysema- MGF Asthma- MGM Heart dz- Father Lung CA- MGF, Aunt No FH of Colon Cancer:  Social History: Reviewed history from 05/06/2009 and no changes required. Married Children Current smoker since age 71.  Smokes 1/2 ppd. No ETOH Homemaker Daily Caffeine Use  Mt Dew occasional marajuana.   Review of Systems      See  HPI  Physical Exam  General:  Well-developed,well-nourished,in no acute distress; alert,appropriate and cooperative throughout examination Psych:  Oriented X3, normally interactive, and good eye contact.     Impression & Recommendations:  Problem # 1:  DEPRESSION (ICD-311)  Her updated medication list for this problem includes:    Alprazolam 0.5 Mg Tabs (Alprazolam) .Marland Kitchen... 1 three times a day as needed    Amitriptyline Hcl 25 Mg Tabs (Amitriptyline hcl) .Marland Kitchen... Take 1 tab by mouth at bedtime    Lexapro 10 Mg Tabs (Escitalopram oxalate) .Marland Kitchen... Take 1 tablet by mouth once a day seroquel xr  slowly get to 300mg  daily  Problem # 2:  FIBROMYALGIA (ICD-729.1)  Her updated medication list for this problem includes:    Flexeril 5 Mg Tabs (Cyclobenzaprine hcl) .Marland Kitchen... As needed  Complete Medication List: 1)  Symbicort 160-4.5 Mcg/act Aero (Budesonide-formoterol fumarate) .... 2 puffs first thing  in am and 2 puffs again in pm about 12 hours later 2)  Vivelle-dot 0.1 Mg/24hr Pttw (Estradiol) .... Change patch every 3 days 3)  Doxazosin Mesylate 4 Mg Tabs (Doxazosin mesylate) .Marland Kitchen.. 1 at bedtime 4)  Alprazolam 0.5 Mg Tabs (Alprazolam) .Marland Kitchen.. 1 three times a day as needed 5)  Amitriptyline Hcl 25 Mg Tabs (Amitriptyline hcl) .... Take 1 tab by mouth at bedtime 6)  Proair Hfa 108 (90 Base) Mcg/act Aers (Albuterol sulfate) .Marland Kitchen.. 1-2 puffs every 4-6 hours as needed if not breathing well 7)  Promethazine Hcl 25 Mg Tabs (Promethazine hcl) .... As needed 8)  Flexeril 5 Mg Tabs (Cyclobenzaprine hcl) .... As needed 9)  Omeprazole 40 Mg Cpdr (Omeprazole) .... Once daily 10)  Lexapro 10 Mg Tabs (Escitalopram oxalate) .... Take 1 tablet by mouth once a day 11)  Librax 2.5-5 Mg Caps (Clidinium-chlordiazepoxide) .Marland Kitchen.. 1 by mouth three times a day prn 12)  Anamantle Hc 3-0.5 % Crea (Lidocaine-hydrocortisone ace) .... Aply to rectal area bid 13)  Folic Acid 1 Mg Tabs (Folic acid) .Marland Kitchen.. 1 by mouth qd 14)  Estrace 0.1  Mg/gm Crea (Estradiol) .... Apply twice a week 15)  Lidocaine Hcl 2 % Soln (Lidocaine hcl (local anesth.)) .... As directed by dr. Logan Bores 16)  Sodium Bicarbonate 8.4 % Soln (Sodium bicarbonate) .... As directed by dr. Logan Bores 17)  Heparin Lock Flush 100 Unit/ml Soln (Heparin sodium (porcine)) .... As directed by dr. Logan Bores 18)  Cefdinir 300 Mg Caps (Cefdinir) .Marland Kitchen.. 1 by mouth two times a day 19)  Seroquel Xr 300 Mg Xr24h-tab (Quetiapine fumarate) .Marland Kitchen.. 1 by mouth at bedtime  Other Orders: Venipuncture ([address]) TLB-B12 + Folate Pnl (60454_09811-B14/NWG) TLB-Lipid Panel (80061-LIPID) TLB-BMP (Basic Metabolic Panel-BMET) (80048-METABOL) TLB-CBC Platelet - w/Differential (85025-CBCD) TLB-Hepatic/Liver Function Pnl (80076-HEPATIC) TLB-TSH (Thyroid Stimulating Hormone) (84443-TSH) T-Vitamin D (25-Hydroxy) ([address]-30865) Prescriptions: SEROQUEL XR 300 MG XR24H-TAB (QUETIAPINE FUMARATE) 1 by mouth at bedtime  #30 x 1   Entered and Authorized by:   Loreen Freud DO   Signed by:   Loreen Freud DO on 07/19/2009   Method used:   Print then Give to Patient   RxID:   [id number] SEROQUEL XR 50  MG XR24H-TAB (QUETIAPINE FUMARATE) 1 by mouth at bedtime x1 then 2 by mouth at bedtime x1 , then 4 by mouth at bedtime x1,  then switch to 300mg   #6 x 0   Entered and Authorized by:   Loreen Freud DO   Signed by:   Loreen Freud DO on 07/19/2009   Method used:   Print then Give to Patient   RxID:   [id number]

## 2010-05-10 NOTE — Miscellaneous (Signed)
Summary: [facility] Consent/[facility]  [facility] Consent/[facility]   Imported By: Lester [place] 04/15/2009 07:23:41  _____________________________________________________________________  External Attachment:    Type:   Image     Comment:   External Document

## 2010-05-10 NOTE — Miscellaneous (Signed)
Summary: change dexilant to nexium  Medications Added NEXIUM 40 MG CPDR (ESOMEPRAZOLE MAGNESIUM) once daily 30 to 60 min before the first meal of the day.       Clinical Lists Changes  Medications: Changed medication from DEXILANT 60 MG CPDR (DEXLANSOPRAZOLE) Take  one 30-60 min before first meal of the day to NEXIUM 40 MG CPDR (ESOMEPRAZOLE MAGNESIUM) once daily 30 to 60 min before the first meal of the day. - Signed Rx of NEXIUM 40 MG CPDR (ESOMEPRAZOLE MAGNESIUM) once daily 30 to 60 min before the first meal of the day.;  #30 x 1;  Signed;  Entered by: Gweneth Dimitri RN;  Authorized by: Nyoka Cowden MD;  Method used: Electronically to [facility] [address] [place], [place], [place]  [address], Ph: [phone], Fax: [phone]    Prescriptions: NEXIUM 40 MG CPDR (ESOMEPRAZOLE MAGNESIUM) once daily 30 to 60 min before the first meal of the day.  #30 x 1   Entered by:   Gweneth Dimitri RN   Authorized by:   Nyoka Cowden MD   Signed by:   Gweneth Dimitri RN on 02/19/2009   Method used:   Electronically to        [facility] (retail)       [address]       [place], [place]  [address]       Ph: [phone]       Fax: [phone]   RxID:   [id number]    received prior auth on dexilant 60mg  once daily.  insurance covers nexium, prilosec, or prevacid.  will switch pt to nexium 40mg  once daily and send rx to pharmacy. Gweneth Dimitri RN  February 19, 2009 4:51 PM

## 2010-05-10 NOTE — Assessment & Plan Note (Signed)
Summary: Pulmonary/ fu ov with pft's, start CHANTIX   Copy to:  Dr. Renaldo Fiddler (GYN) Primary Provider/Referring Provider:  Dr. Megan Mans  CC:  1 month followup.  Pt states that she still has alot of wheezing.  She also c/o SOB and chest tightness in the am and states that she gets very dizzy when she tries to do any activity.  No new complaints today.[name]  History of Present Illness: 35 yowf active smoker no previous chronic resp problems until dx  with AB 2006 and since then using proaire qid as a maintenance med.  December 04, 2008 ov c/w 6 weeks indolent onset progressively worse sensation of wheezing and sob and chest tightness just on left side not much cough, some nausea assoc with hoarsness , worst first thing in am and when lies down.  rec stop smoking and takeSymbicort 160/4.5 2 puffs first thing  in am and 2 puffs again in pm about 12 hours later and fill the prescription if doing better  January 04, 2009 ov cut down on smoking and liked symbicort much less need for saba 1 month followup.  Pt states that she still has alot of wheezing.  She also c/o SOB and chest tightness in the am and states that she gets very dizzy worse  when she tries to do any activity.  No new complaints today.   happens even if not coughing  -  Pt denies any significant sore throat, dyphagia, itching, sneezing,  nasal congestion or excess secretions or cough,  fever, chills, sweats, unintended wt loss, pleuritic or exertional cp, change in activity tolerance  orthopnea pnd or leg swelling. Pt also denies any obvious fluctuation in symptoms with weather or environmental change or other alleviating or aggravating factors.         Current Medications (verified): 1)  Symbicort 160-4.5 Mcg/act  Aero (Budesonide-Formoterol Fumarate) .... 2 Puffs First Thing  in Am and 2 Puffs Again in Pm About 12 Hours Later 2)  Vivelle-Dot 0.1 Mg/24hr Pttw (Estradiol) .... Change Patch Every 3 Days 3)  Doxazosin Mesylate 4 Mg Tabs  (Doxazosin Mesylate) .[name].. 1 At Bedtime 4)  Alprazolam 0.5 Mg Tabs (Alprazolam) .[name].. 1 Three Times A Day As Needed 5)  Amitriptyline Hcl 25 Mg Tabs (Amitriptyline Hcl) .... Take 1 Tab By Mouth At Bedtime 6)  Proair Hfa 108 (90 Base) Mcg/act  Aers (Albuterol Sulfate) .[name].. 1-2 Puffs Every 4-6 Hours As Needed If Not Breathing Well 7)  Promethazine Hcl 25 Mg Tabs (Promethazine Hcl) .... As Needed 8)  Flexeril 5 Mg Tabs (Cyclobenzaprine Hcl) .... As Needed  Allergies (verified): 1)  ! * Ambien 2)  ! Reglan 3)  ! 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) FIBROMYALGIA (ICD-729.1) ADENOMATOUS COLONIC POLYP (ICD-211.3) SLEEP APNEA (ICD-780.57) NEPHROLITHIASIS (ICD-592.0) DEPRESSION (ICD-311) ARTHRITIS (ICD-716.90) HYPERTENSION (ICD-401.9) Asthmatic Bronchitis    - HFA 75%  December 04, 2008     - PFT's January 04, 2009  2.56 (86%)  ratio 75,  no resp to B2 and DLC0 67% > 80  Vital Signs:  Patient profile:   35 year old female Height:      64 inches Weight:      110 pounds O2 Sat:      97 % on Room air Temp:     98.2 degrees F oral Pulse rate:   104 / minute BP sitting:   100 / 66  (left arm)  Vitals Entered By: Vernie Murders (January 04, 2009 11:52  AM)  O2 Flow:  Room air  Physical Exam  Additional Exam:  amb wf nad wt 109 > 110 January 04, 2009  with mint in mouth, slt hoarse with voice fatigue HEENT mild turbinate edema.  Oropharynx no thrush or excess pnd or cobblestoning.  No JVD or cervical adenopathy. Mild accessory muscle hypertrophy. Trachea midline, nl thryroid. Chest was hyperinflated by percussion with diminished breath sounds and moderate increased exp time with prominent  bilateral mid exp sonorous rhonchi. Hoover sign positive at mid inspiration. Regular rate and rhythm without murmur gallop or rub or increase P2 or edema.  Abd: no hsm, nl excursion. Ext warm without cyanosis or clubbing.     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  DDX of  difficult airways managment all start with A and  include Adherence, Ace Inhibitors, Acid Reflux, Active Sinus Disease, Alpha 1 Antitripsin deficiency, Anxiety masquerading as Airways dz,  ABPA,  allergy(esp in young), Aspiration (esp in elderly), Adverse effects of DPI,  Active smokers, plus one B  = Beta blocker use..  so next step is to get her off the cigs and rx empirically for gerd with diet and PPI. See instructions for specific recommendations   Problem # 2:  SMOKER (ICD-305.1)  Will see if she can committ to quit.   I took this opportunity to educate the patient regarding the consequences of smoking in airway disorders based on all the data we have from the multiple national lung health studies indicating that smoking cessation, not choice of inhalers or physicians, is the most important aspect of care.    Her updated medication list for this problem includes:    Chantix Starting Month Pak 0.5 Mg X 11 & 1 Mg X 42 Misc (Varenicline tartrate) .[name]... Take as directed---refills are to be for the continuing pak  Orders: Est. Patient Level IV ([address])  Medications Added to Medication List This Visit: 1)  Dexilant 60 Mg Cpdr (Dexlansoprazole) .... Take  one 30-60 min before first meal of the day 2)  Chantix Starting Month Pak 0.5 Mg X 11 & 1 Mg X 42 Misc (Varenicline tartrate) .... Take as directed---refills are to be for the continuing pak  Patient Instructions: 1)  GERD (gastroesophageal reflux disease) was discussed. It is a common cause of respiratory symptoms. It commonly presents in the absence of heartburn. GERD can be treated with medication, but also with lifestyle changes including avoidance of late meals, excessive alcohol, smoking cessation, and avoid fatty foods, chocolate, all  forms of mint, colas, red wine, and acidic juices such as orange juice. NO MINT OR MENTHOL PRODUCTS (no cough drops 2)  USE SUGARLESS CANDY INSTEAD (jolley ranchers)  3)  Symbicort 160/4.5 2 puffs first thing  in am and 2 puffs again in pm about 12 hours later and fill the prescription if doing better 4)  Dexilant 60 Take  one 30-60 min before first meal of the day  5)  Chantix starter pack, set quit date 7 days after you start it 6)  Please schedule a follow-up appointment in 3 weeks. Prescriptions: CHANTIX STARTING MONTH PAK 0.5 MG X 11 & 1 MG X 42  MISC (VARENICLINE TARTRATE) Take as directed---Refills are to be for the continuing pak  #1 x 0   Entered and Authorized by:   Nyoka Cowden  MD   Signed by:   Nyoka Cowden MD on 01/04/2009   Method used:   Electronically to        [facility] (retail)       [address]       [place], [place]  [address]       Ph: [phone]       Fax: [phone]   RxID:   [id number]

## 2010-05-10 NOTE — Progress Notes (Signed)
Summary: nos appt  Phone Note Call from Patient   Caller: [email or web address]  Call For: Lauraann Missey Summary of Call: LMTCB x2 to rsc nos from 6/10. Initial call taken by: Darletta Moll,  September 21, 2009 2:31 PM

## 2010-05-10 NOTE — Assessment & Plan Note (Signed)
Summary: review lab from neuro/cbs   Vital Signs:  Patient profile:   35 year old female Height:      65 inches Weight:      107 pounds Temp:     98.3 degrees F oral Pulse rate:   86 / minute BP sitting:   90 / 70  (left arm)  Vitals Entered By: Jeremy Johann CMA (October 26, 2009 3:41 PM) CC: review labs from neuro   History of Present Illness: Pt told by neuro her TSH needed to be repeated because it was high in their office.  See scanned results.  Pt admits to dry , brittle hair, dry skin, brittle nails.    Pt also thinks she has another ear infection--- this would  be the 3rd one in 3 months.  Pt is requesting ENT referral.  Current Medications (verified): 1)  Doxazosin Mesylate 4 Mg Tabs (Doxazosin Mesylate) .Marland Kitchen.. 1 At Bedtime 2)  Amitriptyline Hcl 25 Mg Tabs (Amitriptyline Hcl) .... 2 Tabs By Mouth At Bedtime 3)  Folic Acid 1 Mg Tabs (Folic Acid) .Marland Kitchen.. 1 By Mouth Daily 4)  Lexapro 10 Mg Tabs (Escitalopram Oxalate) .... Take 1 Tablet By Mouth Once A Day 5)  Symbicort 160-4.5 Mcg/act  Aero (Budesonide-Formoterol Fumarate) .... 2 Puffs First Thing  in Am and 2 Puffs Again in Pm About 12 Hours Later 6)  Sertraline Hcl 50 Mg Tabs (Sertraline Hcl) .... Take 1 Tab By Mouth At Bedtime 7)  Estring 2 Mg Ring (Estradiol) .... Every 3 Months 8)  Vivelle-Dot 0.1 Mg/24hr Pttw (Estradiol) .... Change Patch Every 3 Days 9)  Omeprazole 40 Mg Cpdr (Omeprazole) .... Take 1 Capsule By Mouth Once A Day Before Meal 10)  Hydroxyzine Hcl 25 Mg Tabs (Hydroxyzine Hcl) .... 2 Tabs By Mouth At Bedtime 11)  Clidinium-Chlordiazepoxide 2.5-5 Mg Caps (Clidinium-Chlordiazepoxide) .... 2 Capsules By Mouth  Every Morning and 1 At Bedtime 12)  Diazepam 5 Mg Tabs (Diazepam) .Marland Kitchen.. 1 By Mouth Three Times A Day 13)  Seroquel Xr 300 Mg Xr24h-Tab (Quetiapine Fumarate) .Marland Kitchen.. 1 By Mouth At Bedtime 14)  Bladder Flush .... Use Weekly 15)  Oscal 500/200 D-3 500-200 Mg-Unit Tabs (Calcium-Vitamin D) .... Take 1 Tablet  By Mouth Two Times A Day 16)  Alprazolam 0.5 Mg Tabs (Alprazolam) .Marland Kitchen.. 1 Tab By Mouth Every 8 Hours As Needed 17)  Promethazine Hcl 25 Mg Tabs (Promethazine Hcl) .... Take 1 Tablet By Mouth Once A Day As Needed 18)  Hydrocortisone 1 % Crea (Hydrocortisone) .... Use As Directed 19)  Renu Saline  Soln (Soft Lens Products) .... Use As Directed 20)  Proair Hfa 108 (90 Base) Mcg/act  Aers (Albuterol Sulfate) .Marland Kitchen.. 1-2 Puffs Every 4-6 Hours As Needed If Not Breathing Well 21)  Mucinex Dm 30-600 Mg Xr12h-Tab (Dextromethorphan-Guaifenesin) .... Take 1-2 Tablets Every 12 Hours As Needed 22)  Zyrtec Allergy 10 Mg Tabs (Cetirizine Hcl) .... Take 1 Tab By Mouth At Bedtime As Needed 23)  Augmentin 875-125 Mg Tabs (Amoxicillin-Pot Clavulanate) .Marland Kitchen.. 1 By Mouth Two Times A Day 24)  Floxin Otic .Marland Kitchen.. 10 Gttsr Ear Once Daily  Allergies (verified): 1)  ! * Ambien 2)  ! Reglan 3)  ! Darvocet  Past History:  Past medical, surgical, family and social histories (including risk factors) reviewed for relevance to current acute and chronic problems.  Past Medical History: Reviewed history from 08/04/2009 and no changes required. 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 hysterectomy cholecystectomy pacemaker in hip for interstitial cystitis  Family History: Reviewed history from 03/11/2009 and no changes required. Emphysema- MGF Asthma- MGM Heart dz-  Father Lung CA- MGF, Aunt No FH of Colon Cancer:  Social History: Reviewed history from 08/20/2009 and no changes required. Married 2 Children Current smoker since age 24.  Smokes 1/2 ppd. No ETOH Homemaker Daily Caffeine Use  Mt Dew occasional marajuana.   Review of Systems      See HPI  Physical Exam  General:  Well-developed,well-nourished,in no acute distress; alert,appropriate and cooperative throughout examination Ears:  Rtm red and canal red L -normal Nose:  External nasal examination shows no deformity or inflammation. Nasal mucosa are pink and moist without lesions or exudates. Mouth:  Oral mucosa and oropharynx without lesions or exudates.  Teeth in good repair. Neck:  No deformities, masses, or tenderness noted. Lungs:  Normal respiratory effort, chest expands symmetrically. Lungs are clear to auscultation, no crackles or wheezes. Psych:  Oriented X3 and normally interactive.     Impression & Recommendations:  Problem # 1:  OTITIS MEDIA, RECURRENT (ICD-382.9)  Her updated medication list for this problem includes:    Augmentin 875-125 Mg Tabs (Amoxicillin-pot clavulanate) .Marland Kitchen... 1 by mouth two times a day  Orders: ENT Referral (ENT)  Instructed on prevention and treatment. Call if no improvement in 48-72 hours or sooner if worsening symptoms.   Problem # 2:  HYPOTHYROIDISM (ICD-244.9) labs from neuro reviewed and scanned Orders: Venipuncture (91478) TLB-TSH (Thyroid Stimulating Hormone) (84443-TSH) TLB-T3, Free (Triiodothyronine) (84481-T3FREE) TLB-T4 (Thyrox), Free [phone]) Specimen Handling (65784)  Labs Reviewed: TSH: 1.46 (07/19/2009)    Chol: 127 (07/19/2009)   HDL: 42.80 (07/19/2009)   LDL: 58 (07/19/2009)   TG: 130.0 (07/19/2009)  Complete Medication List: 1)  Doxazosin Mesylate 4 Mg Tabs (Doxazosin mesylate) .Marland Kitchen.. 1 at bedtime 2)  Amitriptyline Hcl 25 Mg Tabs (Amitriptyline hcl) .... 2 tabs by mouth at bedtime 3)  Folic Acid 1 Mg Tabs  (Folic acid) .Marland Kitchen.. 1 by mouth daily 4)  Lexapro 10 Mg Tabs (Escitalopram oxalate) .... Take 1 tablet by mouth once a day 5)  Symbicort 160-4.5 Mcg/act Aero (Budesonide-formoterol fumarate) .... 2 puffs first thing  in am and 2 puffs again in pm about 12 hours later 6)  Sertraline Hcl 50 Mg Tabs (Sertraline hcl) .... Take 1 tab by mouth at bedtime 7)  Estring 2 Mg Ring (Estradiol) .... Every 3 months 8)  Vivelle-dot 0.1 Mg/24hr Pttw (Estradiol) .... Change patch every 3 days 9)  Omeprazole 40 Mg Cpdr (Omeprazole) .... Take 1 capsule by mouth once a day before meal 10)  Hydroxyzine Hcl 25 Mg Tabs (Hydroxyzine hcl) .... 2 tabs by mouth at bedtime 11)  Clidinium-chlordiazepoxide 2.5-5 Mg Caps (Clidinium-chlordiazepoxide) .... 2 capsules by mouth  every morning and 1 at bedtime 12)  Diazepam 5 Mg Tabs (Diazepam) .Marland Kitchen.. 1 by mouth three times a day 13)  Seroquel Xr 300 Mg Xr24h-tab (Quetiapine fumarate) .Marland Kitchen.. 1 by mouth at bedtime 14)  Bladder Flush  .... Use weekly 15)  Oscal 500/200 D-3 500-200 Mg-unit Tabs (Calcium-vitamin d) .Marland KitchenMarland KitchenMarland Kitchen  Take 1 tablet by mouth two times a day 16)  Alprazolam 0.5 Mg Tabs (Alprazolam) .Marland Kitchen.. 1 tab by mouth every 8 hours as needed 17)  Promethazine Hcl 25 Mg Tabs (Promethazine hcl) .... Take 1 tablet by mouth once a day as needed 18)  Hydrocortisone 1 % Crea (Hydrocortisone) .... Use as directed 19)  Renu Saline Soln (Soft lens products) .... Use as directed 20)  Proair Hfa 108 (90 Base) Mcg/act Aers (Albuterol sulfate) .Marland Kitchen.. 1-2 puffs every 4-6 hours as needed if not breathing well 21)  Mucinex Dm 30-600 Mg Xr12h-tab (Dextromethorphan-guaifenesin) .... Take 1-2 tablets every 12 hours as needed 22)  Zyrtec Allergy 10 Mg Tabs (Cetirizine hcl) .... Take 1 tab by mouth at bedtime as needed 23)  Augmentin 875-125 Mg Tabs (Amoxicillin-pot clavulanate) .Marland Kitchen.. 1 by mouth two times a day 24)  Floxin Otic  .Marland Kitchen.. 10 gttsr ear once daily Prescriptions: FLOXIN OTIC 10 gttsR ear once  daily  #10days x 0   Entered and Authorized by:   Loreen Freud DO   Signed by:   Loreen Freud DO on 10/26/2009   Method used:   Faxed to ...       [facility] (retail)       [address]       [place], [place]  [address]       Ph: [phone]       Fax: [phone]   RxID:   [id number] AUGMENTIN 875-125 MG TABS (AMOXICILLIN-POT CLAVULANATE) 1 by mouth two times a day  #20 x 0   Entered and Authorized by:   Loreen Freud DO   Signed by:   Loreen Freud DO on 10/26/2009   Method used:   Electronically to        [facility] (retail)       [address]       [place], [place]  [address]       Ph: [phone]       Fax: [phone]   RxID:   [id number]

## 2010-05-10 NOTE — Progress Notes (Signed)
Summary: Seroquel refill  Phone Note Refill Request Message from:  Fax from Pharmacy on March 01, 2010 12:41 PM  Refills Requested: Medication #1:  SEROQUEL XR 300 MG XR24H-TAB 1 by mouth at bedtime [name] [place], [place]  [address]  ph  [phone]   Fax = [phone]   qty = 30  Next Appointment Scheduled: none Initial call taken by: Jerolyn Shin,  March 01, 2010 12:43 PM  Follow-up for Phone Call        last office visit 10-26-09 and last filled 01-17-10...Marland KitchenMarland KitchenDoristine Devoid CMA  March 01, 2010 4:01 PM   Additional Follow-up for Phone Call Additional follow up Details #1::        ok to refill until July Additional Follow-up by: Loreen Freud DO,  March 01, 2010 4:03 PM    Prescriptions: SEROQUEL XR 300 MG XR24H-TAB (QUETIAPINE FUMARATE) 1 by mouth at bedtime  #30 Each x 6   Entered by:   Doristine Devoid CMA   Authorized by:   Loreen Freud DO   Signed by:   Doristine Devoid CMA on 03/01/2010   Method used:   Electronically to        [facility] (retail)       [address]       [place], [place]  [address]       Ph: [phone]       Fax: [phone]   RxID:   [id number]

## 2010-05-10 NOTE — Assessment & Plan Note (Signed)
Summary: Pulmonary/ new pt eval, start symbicort 160   Visit Type:  Initial Consult Copy to:  Dr. Renaldo Fiddler (GYN) Primary Provider/Referring Provider:  Dr. Megan Mans  CC:  Wheezing/CP.  History of Present Illness: 30 yowf active smoker no previous chronic resp problems until dx  with AB 2006 and since then using proaire qid as a maintenance med.  December 04, 2008 ov c/w 6 weeks indolent onset progressively worse sensation of wheezing and sob and chest tightness just on left side not much cough, some nausea assoc with hoarsness , worst first thing in am and when lies down.    Pt denies any significant sore throat, dyphagia, itching, sneezing,  nasal congestion or excess secretions ,  fever, chills, sweats, unintended wt loss, pleuritic or exertional cp, change in activity tolerance  orthopnea pnd or leg swelling.  Pt also denies any obvious fluctuation in symptoms with weather or environmental change or other alleviating or aggravating factors.       Current Medications (verified): 1)  Vivelle-Dot 0.1 Mg/24hr Pttw (Estradiol) .... Change Patch Every 3 Days 2)  Doxazosin Mesylate 4 Mg Tabs (Doxazosin Mesylate) .Marland Kitchen.. 1 At Bedtime 3)  Alprazolam 0.5 Mg Tabs (Alprazolam) .Marland Kitchen.. 1 Three Times A Day As Needed 4)  Amitriptyline Hcl 25 Mg Tabs (Amitriptyline Hcl) .... Take 1 Tab By Mouth At Bedtime 5)  Proair Hfa 108 (90 Base) Mcg/act  Aers (Albuterol Sulfate) .Marland Kitchen.. 1-2 Puffs Every 4-6 Hours As Needed 6)  Promethazine Hcl 25 Mg Tabs (Promethazine Hcl) .... As Needed 7)  Flexeril 5 Mg Tabs (Cyclobenzaprine Hcl) .... As Needed  Allergies (verified): 1)  ! * Ambien 2)  ! Reglan 3)  ! 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  Smokes 1 ppd. No ETOH Homemaker  Review of Systems       The patient complains of shortness of breath with activity, shortness of breath at rest, non-productive cough, chest pain, irregular heartbeats, acid heartburn, indigestion, loss of appetite, weight change, abdominal pain, tooth/dental problems, headaches, anxiety, and depression.  The patient denies productive cough, coughing up blood, difficulty swallowing, sore throat, nasal congestion/difficulty breathing through nose, sneezing, itching, ear ache, hand/feet swelling, joint stiffness or pain, rash, change in color of mucus, and fever.    Vital Signs:  Patient profile:   35 year old female Height:      64 inches Weight:      109.25 pounds BMI:     18.82 O2 Sat:      96 % on Room air Temp:     97.5 degrees F oral Pulse rate:   90 / minute BP sitting:   108 / 76  (left arm)  Vitals Entered By: Vernie Murders (December 04, 2008 2:10 PM)  O2 Flow:  Room air  Physical Exam  Additional Exam:  amb wf nad HEENT mild turbinate edema.  Oropharynx no thrush or excess pnd or cobblestoning.  No JVD or cervical adenopathy. Mild accessory muscle hypertrophy. Trachea midline, nl thryroid. Chest was hyperinflated by percussion with diminished breath sounds and moderate increased exp time with prominent  bilateral mid exp sonorous rhonchi. Hoover sign positive at mid inspiration. Regular  rate and rhythm without murmur gallop or rub or increase P2 or edema.  Abd: no hsm, nl excursion. Ext warm without cyanosis or clubbing.     CXR  Procedure date:  12/04/2008  Findings:      Large lung volumes. No acute cardiopulmonary abnormality.  Impression & Recommendations:  Problem # 1:  ASTHMA (ICD-493.90) Hopefully not evolving to copd, though she is certainly at risk and will need f/u  pft's  I spent extra time with the patient today explaining optimal mdi  technique.  This improved from  25-50% but needs work  I discussed the importance of understanding the goals of asthma therapy including the rule of 2's as it applies to the use of a rescue inhaler so start Symbicort 160 2 puffs first thing  in am and 2 puffs again in pm about 12 hours later and see if symptoms improve and SABA use can be reduced  or not  Problem # 2:  SMOKER (ICD-305.1)  I took this opportunity to educate the patient regarding the consequences of smoking in airway disorders based on all the data we have from the multiple national lung health studies indicating that smoking cessation, not choice of inhalers or physicians, is the most important aspect of care.   Discussed,  considering committing to quit.  Suggest pt set a quit date and make  an appt to see me 7 days prior for additonal counseling and consideration for chantix or can do this thru her primary doctor.  Medications Added to Medication List This Visit: 1)  Symbicort 160-4.5 Mcg/act Aero (Budesonide-formoterol fumarate) .... 2 puffs first thing  in am and 2 puffs again in pm about 12 hours later 2)  Doxazosin Mesylate 4 Mg Tabs (Doxazosin mesylate) .Marland Kitchen.. 1 at bedtime 3)  Alprazolam 0.5 Mg Tabs (Alprazolam) .Marland Kitchen.. 1 three times a day as needed 4)  Proair Hfa 108 (90 Base) Mcg/act Aers (Albuterol sulfate) .Marland Kitchen.. 1-2 puffs every 4-6 hours as needed if not breathing well 5)  Flexeril 5 Mg Tabs (Cyclobenzaprine hcl) .... As needed  Complete Medication List: 1)  Symbicort 160-4.5 Mcg/act Aero (Budesonide-formoterol fumarate) .... 2 puffs first thing  in am and 2 puffs again in pm about 12 hours later 2)  Vivelle-dot 0.1 Mg/24hr Pttw (Estradiol) .... Change patch every 3 days 3)  Doxazosin Mesylate 4 Mg Tabs (Doxazosin mesylate) .Marland Kitchen.. 1 at bedtime 4)  Alprazolam 0.5 Mg Tabs (Alprazolam) .Marland Kitchen.. 1 three times a day as needed 5)  Amitriptyline Hcl 25 Mg Tabs  (Amitriptyline hcl) .... Take 1 tab by mouth at bedtime 6)  Proair Hfa 108 (90 Base) Mcg/act Aers (Albuterol sulfate) .Marland Kitchen.. 1-2 puffs every 4-6 hours as needed if not breathing well 7)  Promethazine Hcl 25 Mg Tabs (Promethazine hcl) .... As needed 8)  Flexeril 5 Mg Tabs (Cyclobenzaprine hcl) .... As needed  Other Orders: T-2 View CXR (71020TC)  Patient Instructions: 1)  GERD (gastroesophageal reflux disease) was discussed. It is a common cause of respiratory symptoms. It commonly presents in the absence of heartburn. GERD can be treated with medication, but also with lifestyle changes including avoidance of late meals, excessive alcohol, smoking cessation, and avoid fatty foods, chocolate, peppermint, colas, red wine, and acidic juices such as orange juice. NO MINT OR MENTHOL PRODUCTS (no cough drops 2)  USE SUGARLESS CANDY INSTEAD (jolley ranchers)  3)  Symbicort 160/4.5 2 puffs first thing  in am and 2 puffs again in pm about 12 hours later and fill the  prescription if doing better 4)  Please schedule a follow-up appointment in 1 month with PFT's  Prescriptions: SYMBICORT 160-4.5 MCG/ACT  AERO (BUDESONIDE-FORMOTEROL FUMARATE) 2 puffs first thing  in am and 2 puffs again in pm about 12 hours later  #1 x 3   Entered and Authorized by:   Nyoka Cowden MD   Signed by:   Nyoka Cowden MD on 12/04/2008   Method used:   Print then Give to Patient   RxID:   [id number]   Appended Document: Pulmonary/ new pt eval, start symbicort 160

## 2010-05-10 NOTE — Assessment & Plan Note (Signed)
Summary: Pulmonary/ ext final summary ov   Copy to:  [name], MD Primary Provider/Referring Provider:  [name]  CC:  Followup.  Pt c/o worsening cough x 2 wks- prod with yellow sputum.  Chest congestion and wheezing are also worse x 2 wks.  Still smoking 1/2 ppd..  History of Present Illness: 28  yowf active smoker no previous chronic resp problems until dx  with AB 2006 and since then using proaire qid as a maintenance med.  December 04, 2008 ov c/w 6 weeks indolent onset progressively worse sensation of wheezing and sob and chest tightness just on left side not much cough, some nausea assoc with hoarsness , worst first thing in am and when lies down.  rec stop smoking and takeSymbicort 160/4.5 2 puffs first thing  in am and 2 puffs again in pm about 12 hours later and fill the prescription if doing better  January 04, 2009 ov cut down on smoking and liked symbicort much less need for saba 1 month followup.  Pt states that she still has alot of wheezing.  She also c/o SOB and chest tightness in the am and states that she gets very dizzy worse  when she tries to do any activity.  rec add dexilant and found that everything was fine until ran out of dexilant   February 05, 2009 ov 3 wk followup.  Pt states that her breathing was doing much better until she ran out of dexilant.  She states that she has been out for 5 days and has noticed incxreased SOB- same as before.  She c/o "constantly nauseas" and "can not keep anything down" x 1 wk.   while on dexilant much better but still required saba 2 -3 x daily and not able to stop smoking.    May 06, 2009--Presents for a 3 month follow up - states breathing was better when she was taking the dexilant rather than the nexium, symbicort has been working well.  has prod cough with clear mucus, chest congestion, increased dyspnea, wheezing x1week. Wants to change back to dexilant. Doing well until last week , with thick mucus. rec change to dexilant,  short cycle of pred > better  August 04, 2009  c/o increased SOB with exertion and cough with yellow mucus and wheezing x3-4 days.Pt denies any significant sore throat, nasal congestion or excess secretions, fever, chills, sweats, unintended wt loss, pleuritic or exertional cp, orthopnea pnd or leg swelling.  better with saba5/13/11 Presents for follow up and med review. Last visit w/ bronchitic flare tx doxycycline and steroids. She says she got better but wheezing and cough returned after 1 week.  Cough is mainly dry in nature, no fever or discolored mucus. Cough is not as bad as last visit. She continues to smoke. We reviewed her meds and organized them into a medication calendar.  rec mucinex dm  November 02, 2009 Followup.  Pt c/o worsening cough x 2 wks- prod with yellow sputum.  Chest congestion and wheezing are also worse x 2 wks.  Still smoking 1/2 ppd.  Pt denies any significant sore throat, dysphagia, itching, sneezing,  nasal congestion or excess secretions,  fever, chills, sweats, unintended wt loss, pleuritic or exertional cp, hempoptysis, change in poor  activity tolerance  orthopnea pnd or leg swelling. saba no longer effective at relieving symptoms. also r ear stuffy/ achy  Current Medications (verified): 1)  Doxazosin Mesylate 4 Mg Tabs (Doxazosin Mesylate) .Marland Kitchen.. 1 At Bedtime 2)  Amitriptyline Hcl 25  Mg Tabs (Amitriptyline Hcl) .... 2 Tabs By Mouth At Bedtime 3)  Folic Acid 1 Mg Tabs (Folic Acid) .Marland Kitchen.. 1 By Mouth Daily 4)  Lexapro 10 Mg Tabs (Escitalopram Oxalate) .... Take 1 Tablet By Mouth Once A Day 5)  Symbicort 160-4.5 Mcg/act  Aero (Budesonide-Formoterol Fumarate) .... 2 Puffs First Thing  in Am and 2 Puffs Again in Pm About 12 Hours Later 6)  Sertraline Hcl 50 Mg Tabs (Sertraline Hcl) .... Take 1 Tab By Mouth At Bedtime 7)  Estring 2 Mg Ring (Estradiol) .... Every 3 Months 8)  Vivelle-Dot 0.1 Mg/24hr Pttw (Estradiol) .... Change Patch Every 3 Days 9)  Omeprazole 40 Mg Cpdr  (Omeprazole) .... Take 1 Capsule By Mouth Once A Day Before Meal 10)  Hydroxyzine Hcl 25 Mg Tabs (Hydroxyzine Hcl) .... 2 Tabs By Mouth At Bedtime 11)  Clidinium-Chlordiazepoxide 2.5-5 Mg Caps (Clidinium-Chlordiazepoxide) .... 2 Capsules By Mouth  Every Morning and 1 At Bedtime 12)  Diazepam 5 Mg Tabs (Diazepam) .Marland Kitchen.. 1 By Mouth Three Times A Day 13)  Seroquel Xr 300 Mg Xr24h-Tab (Quetiapine Fumarate) .Marland Kitchen.. 1 By Mouth At Bedtime 14)  Bladder Flush .... Use Weekly 15)  Oscal 500/200 D-3 500-200 Mg-Unit Tabs (Calcium-Vitamin D) .... Take 1 Tablet By Mouth Two Times A Day 16)  Alprazolam 0.5 Mg Tabs (Alprazolam) .Marland Kitchen.. 1 Tab By Mouth Every 8 Hours As Needed 17)  Promethazine Hcl 25 Mg Tabs (Promethazine Hcl) .... Take 1 Tablet By Mouth Once A Day As Needed 18)  Hydrocortisone 1 % Crea (Hydrocortisone) .... Use As Directed 19)  Renu Saline  Soln (Soft Lens Products) .... Use As Directed 20)  Proair Hfa 108 (90 Base) Mcg/act  Aers (Albuterol Sulfate) .Marland Kitchen.. 1-2 Puffs Every 4-6 Hours As Needed If Not Breathing Well 21)  Mucinex Dm 30-600 Mg Xr12h-Tab (Dextromethorphan-Guaifenesin) .... Take 1-2 Tablets Every 12 Hours As Needed 22)  Zyrtec Allergy 10 Mg Tabs (Cetirizine Hcl) .... Take 1 Tab By Mouth At Bedtime As Needed 23)  Floxin Otic .Marland Kitchen.. 10 Gttsr Ear Once Daily  Allergies (verified): 1)  ! * Ambien 2)  ! Reglan 3)  ! Darvocet  Vital Signs:  Patient profile:   35 year old female Weight:      108.13 pounds O2 Sat:      97 % on Room air Temp:     98.1 degrees F oral Pulse rate:   101 / minute BP sitting:   108 / 70  (left arm)  Vitals Entered By: Vernie Murders (November 02, 2009 11:16 AM)  O2 Flow:  Room air CC: Followup.  Pt c/o worsening cough x 2 wks- prod with yellow sputum.  Chest congestion and wheezing are also worse x 2 wks.  Still smoking 1/2 ppd.   Physical Exam  Additional Exam:  amb wf nad  wt 109 > 110 January 04, 2009 > 107 February 05, 2009 >  108 November 02, 2009    HEENT mild turbinate edema.  Oropharynx no thrush or excess pnd or cobblestoning.  No JVD or cervical adenopathy. Mild accessory muscle hypertrophy. Trachea midline, nl thryroid. Chest coarse BS w/ no wheezing   Regular rate and rhythm without murmur gallop or rub or increase P2 or edema.  Abd: no hsm, nl excursion. Ext warm without cyanosis or clubbing.   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 Patient Level IV (14782)  Problem # 2:  SMOKER (ICD-305.1)  > 3 min discussion   I took this opportunity to educate the patient regarding the consequences of smoking in airway disorders based on all the data we have from the multiple national lung health studies indicating that smoking cessation, not choice of inhalers or physicians, is the most important aspect of care.  Discussed but not ready to committ to quit at this point - emphasized risks involved in continuing smoking and that patient should consider these in the context of the cost of smoking relative to the benefit obtained.    Each maintenance medication was reviewed in detail including most importantly the difference between maintenance and as needed and under what circumstances the prns are to be used. This was done in the context of a medication calendar review which provided the patient with a user-friendly unambiguous mechanism for medication administration and reconciliation and provides an action plan for all active problems. It is critical that this be shown to every doctor  for modification during the office visit if necessary so the patient can use it as a working document.   Medications Added to Medication List This Visit: 1)  Augmentin 875-125 Mg Tabs (Amoxicillin-pot clavulanate) .... By mouth twice  daily with glass of water and eat yogurt for lunch  Patient Instructions: 1)  Antihistmines are drying your mucus making it more likely you are going to be prone to ear, sinus and lung infections and I strongly rec they be avoided if possible (will Dr Logan Bores know) 2)   although we never turn away smokers from the pulmonary clinic, we do ask that they understand that the recommendations that we  made won't work nearly as well in the presence of continued cigarette exposure and we may reach a point where we can't help the patient if she can't quit smoking.   3)  Augmentin 875 mg twice daily x 10 days should turn the mucus clear  4)  Follow up here is as needed 5)  See calendar for specific medication instructions and bring it back for each and every office visit for every healthcare provider you see.  Without it,  you may not receive the best quality medical care that we feel you deserve.  Prescriptions: AUGMENTIN 875-125 MG  TABS (AMOXICILLIN-POT CLAVULANATE) By mouth twice daily with glass of water and eat yogurt for lunch  #20 x 0   Entered and Authorized by:   Nyoka Cowden MD   Signed by:   Nyoka Cowden MD on 11/02/2009   Method used:   Electronically to        [facility] (retail)       [address]       [place], [place]  [address]       Ph: [phone]       Fax: [phone]   RxID:   [id number]

## 2010-05-10 NOTE — Procedures (Signed)
Summary: Colonoscopy  Patient: [name] Note: All result statuses are Final unless otherwise noted.  Tests: (1) Colonoscopy (COL)   COL Colonoscopy           DONE     [facility]     [address] [place], [place]  [address]           COLONOSCOPY PROCEDURE REPORT           PATIENT:  [name], [name]  MR#:  [id number]     BIRTHDATE:  Mar 25, 1976, 33 yrs. old  GENDER:  female           ENDOSCOPIST:  Rachael Fee, MD     Referred by:  Vania Rea. Jarold Motto, M.D.           PROCEDURE DATE:  04/15/2009     PROCEDURE:  Colonoscopy with biopsy     ASA CLASS:  Class II     INDICATIONS:  chronic diarrhea (also adenomatous polyp removed 3     years ago by Dr. Jarold Motto)           MEDICATIONS:   MAC sedation, administered by CRNA           DESCRIPTION OF PROCEDURE:   After the risks benefits and     alternatives of the procedure were thoroughly explained, informed     consent was obtained.  Digital rectal exam was performed and     revealed no rectal masses.   The [id number] ([id number]) endoscope was     introduced through the anus and advanced to the terminal ileum     which was intubated for a short distance, without limitations.     The quality of the prep was adequate, using Osmoprep.  The     instrument was then slowly withdrawn as the colon was fully     examined.<<PROCEDUREIMAGES>>           FINDINGS:  The terminal ileum appeared normal (see image004).  A     normal appearing cecum, ileocecal valve, and appendiceal orifice     were identified. The ascending, hepatic flexure, transverse,     splenic flexure, descending, sigmoid colon, and rectum appeared     unremarkable (see image003 and image005).   Retroflexed views in     the rectum revealed not done due to small rectal vault.  Anus was     well visualized however, there were some small anal papillea but     no obvious fissures or hemorrhoids.  The scope was then withdrawn     from the patient and the procedure  completed.           COMPLICATIONS:  None           ENDOSCOPIC IMPRESSION:     1) Normal terminal ileum; no signs of Crohn's disease     2) Normal colon; no macroscopic colitis, polyps or cancers.     Random colon biopsies were taken to check for microscopic colitis.                 RECOMMENDATIONS:     Await final colon biopsies.  I will communicate these results     with Dr. Jarold Motto.           REPEAT EXAM:  Will need repeat colonoscopy in 5 years given     personal history of adenomatous (pre-cancerous) colon polyps.           ______________________________  Rachael Fee, MD           n.     Rosalie Doctor:   Rachael Fee at 04/15/2009 11:16 AM           Audelia Hives, [phone]  Note: An exclamation mark (!) indicates a result that was not dispersed into the flowsheet. Document Creation Date: 04/15/2009 1:00 PM _______________________________________________________________________  (1) Order result status: Final Collection or observation date-time: 04/15/2009 11:07 Requested date-time:  Receipt date-time:  Reported date-time:  Referring Physician:   Ordering Physician: Rob Bunting [phone]) Specimen Source:  Source: Launa Grill Order Number: [id number] Lab site:

## 2010-05-10 NOTE — Assessment & Plan Note (Signed)
Summary: NP follow up -asthma/gerd   Copy to:  [name], MD Primary Provider/Referring Provider:  [name], MD  CC:  3 month follow up - states breathing was better when she was taking the dexilant rather than the nexium, symbicort has been working well.  has prod cough with clear mucus, chest congestion, increased dyspnea, and wheezing x1week.Marland Kitchen  History of Present Illness: 35  yowf active smoker no previous chronic resp problems until dx  with AB 2006 and since then using proaire qid as a maintenance med.  December 04, 2008 ov c/w 6 weeks indolent onset progressively worse sensation of wheezing and sob and chest tightness just on left side not much cough, some nausea assoc with hoarsness , worst first thing in am and when lies down.  rec stop smoking and takeSymbicort 160/4.5 2 puffs first thing  in am and 2 puffs again in pm about 12 hours later and fill the prescription if doing better  January 04, 2009 ov cut down on smoking and liked symbicort much less need for saba 1 month followup.  Pt states that she still has alot of wheezing.  She also c/o SOB and chest tightness in the am and states that she gets very dizzy worse  when she tries to do any activity.  rec add dexilant and found that everything was fine until ran out of dexilant   February 05, 2009 ov 3 wk followup.  Pt states that her breathing was doing much better until she ran out of dexilant.  She states that she has been out for 5 days and has noticed incxreased SOB- same as before.  She c/o "constantly nauseas" and "can not keep anything down" x 1 wk.   while on dexilant much better but still required saba 2 -3 x daily and not able to stop smoking.    May 06, 2009--Presents for a 3 month follow up - states breathing was better when she was taking the dexilant rather than the nexium, symbicort has been working well.  has prod cough with clear mucus, chest congestion, increased dyspnea, wheezing x1week. Wants to change  back to dexilant. Doing well until last week , with thick mucus. Denies chest pain,  orthopnea, hemoptysis, fever, n/v/d, edema, headache,recent travel or antibiotics.    Preventive Screening-Counseling & Management  Alcohol-Tobacco     Smoking Status: current  Medications Prior to Update: 1)  Symbicort 160-4.5 Mcg/act  Aero (Budesonide-Formoterol Fumarate) .... 2 Puffs First Thing  in Am and 2 Puffs Again in Pm About 12 Hours Later 2)  Vivelle-Dot 0.1 Mg/24hr Pttw (Estradiol) .... Change Patch Every 3 Days 3)  Doxazosin Mesylate 4 Mg Tabs (Doxazosin Mesylate) .Marland Kitchen.. 1 At Bedtime 4)  Alprazolam 0.5 Mg Tabs (Alprazolam) .Marland Kitchen.. 1 Three Times A Day As Needed 5)  Amitriptyline Hcl 25 Mg Tabs (Amitriptyline Hcl) .... Take 1 Tab By Mouth At Bedtime 6)  Proair Hfa 108 (90 Base) Mcg/act  Aers (Albuterol Sulfate) .Marland Kitchen.. 1-2 Puffs Every 4-6 Hours As Needed If Not Breathing Well 7)  Promethazine Hcl 25 Mg Tabs (Promethazine Hcl) .... As Needed 8)  Flexeril 5 Mg Tabs (Cyclobenzaprine Hcl) .... As Needed 9)  Zoloft 50 Mg Tabs (Sertraline Hcl) .Marland Kitchen.. 1 Once Daily 10)  Nexium 40 Mg Cpdr (Esomeprazole Magnesium) .... Once Daily 30 To 60 Min Before The First Meal of The Day. 11)  Lexapro 10 Mg Tabs (Escitalopram Oxalate) .... Take 1 Tablet By Mouth Once A Day 12)  Zoloft 50 Mg Tabs (  Sertraline Hcl) .... Take 1 Tablet By Mouth Once A Day 13)  Librax 2.5-5 Mg Caps (Clidinium-Chlordiazepoxide) .Marland Kitchen.. 1 By Mouth Three Times A Day Prn 14)  Anamantle Hc 3-0.5 % Crea (Lidocaine-Hydrocortisone Ace) .... Aply To Rectal Area Bid 15)  Folic Acid 1 Mg Tabs (Folic Acid) .Marland Kitchen.. 1 By Mouth Qd  Current Medications (verified): 1)  Symbicort 160-4.5 Mcg/act  Aero (Budesonide-Formoterol Fumarate) .... 2 Puffs First Thing  in Am and 2 Puffs Again in Pm About 12 Hours Later 2)  Vivelle-Dot 0.1 Mg/24hr Pttw (Estradiol) .... Change Patch Every 3 Days 3)  Doxazosin Mesylate 4 Mg Tabs (Doxazosin Mesylate) .Marland Kitchen.. 1 At Bedtime 4)  Alprazolam  0.5 Mg Tabs (Alprazolam) .Marland Kitchen.. 1 Three Times A Day As Needed 5)  Amitriptyline Hcl 25 Mg Tabs (Amitriptyline Hcl) .... Take 1 Tab By Mouth At Bedtime 6)  Proair Hfa 108 (90 Base) Mcg/act  Aers (Albuterol Sulfate) .Marland Kitchen.. 1-2 Puffs Every 4-6 Hours As Needed If Not Breathing Well 7)  Promethazine Hcl 25 Mg Tabs (Promethazine Hcl) .... As Needed 8)  Flexeril 5 Mg Tabs (Cyclobenzaprine Hcl) .... As Needed 9)  Nexium 40 Mg Cpdr (Esomeprazole Magnesium) .... Once Daily 30 To 60 Min Before The First Meal of The Day. 10)  Lexapro 10 Mg Tabs (Escitalopram Oxalate) .... Take 1 Tablet By Mouth Once A Day 11)  Zoloft 50 Mg Tabs (Sertraline Hcl) .... Take 1 Tablet By Mouth Once A Day 12)  Librax 2.5-5 Mg Caps (Clidinium-Chlordiazepoxide) .Marland Kitchen.. 1 By Mouth Three Times A Day Prn 13)  Anamantle Hc 3-0.5 % Crea (Lidocaine-Hydrocortisone Ace) .... Aply To Rectal Area Bid 14)  Folic Acid 1 Mg Tabs (Folic Acid) .Marland Kitchen.. 1 By Mouth Qd 15)  Estrace 0.1 Mg/gm Crea (Estradiol) .... Apply Twice A Week 16)  Amoxicillin-Pot Clavulanate 500-125 Mg Tabs (Amoxicillin-Pot Clavulanate) .... Take 1 Tablet By Mouth Three Times A Day - Began Taking 04-29-09 17)  Lidocaine Hcl 2 % Soln (Lidocaine Hcl (Local Anesth.)) .... As Directed By Dr. Logan Bores 18)  Sodium Bicarbonate 8.4 % Soln (Sodium Bicarbonate) .... As Directed By Dr. Logan Bores 19)  Heparin Lock Flush 100 Unit/ml Soln (Heparin Sodium (Porcine)) .... As Directed By Dr. Logan Bores  Allergies (verified): 1)  ! * Ambien 2)  ! Reglan 3)  ! 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.3) SLEEP APNEA (ICD-780.57) NEPHROLITHIASIS (ICD-592.0) DEPRESSION (ICD-311) ARTHRITIS (ICD-716.90) HYPERTENSION (ICD-401.9) Asthmatic Bronchitis............................................................Marland KitchenWert    - HFA 75%  December 04, 2008 > 75% February 05, 2009     - PFT's  January 04, 2009  2.56 (86%)  ratio 75,  no resp to B2 and DLC0 67% > 80 Interstitial Cystitis  Past Surgical History: Last updated: 03/11/2009 hysterectomy cholecystectomy pacemaker in hip for interstitial cystitis  Family History: Last updated: 03/11/2009 Emphysema- MGF Asthma- MGM Heart dz- Father Lung CA- MGF, Aunt No FH of Colon Cancer:  Social History: Last updated: 05/06/2009 Married Children Current smoker since age 82.  Smokes 1/2 ppd. No ETOH Homemaker Daily Caffeine Use  Mt Dew occasional marajuana.   Risk Factors: Smoking Status: current (05/06/2009)  Social History: Married Children Current smoker since age 30.  Smokes 1/2 ppd. No ETOH Homemaker Daily Caffeine Use  Mt Dew occasional marajuana. Smoking Status:  current  Review of Systems      See HPI  Vital Signs:  Patient profile:   35 year old female Height:  64 inches Weight:      115.13 pounds BMI:     19.83 O2 Sat:      98 % on Room air Temp:     98.0 degrees F oral Pulse rate:   83 / minute BP sitting:   106 / 64  (left arm) Cuff size:   regular  Vitals Entered By: Boone Master CNA (May 06, 2009 4:09 PM)  O2 Flow:  Room air CC: 3 month follow up - states breathing was better when she was taking the dexilant rather than the nexium, symbicort has been working well.  has prod cough with clear mucus, chest congestion, increased dyspnea, wheezing x1week. Is Patient Diabetic? No Comments Medications reviewed with patient Daytime contact number verified with patient. Boone Master CNA  May 06, 2009 4:08 PM    Physical Exam  Additional Exam:  amb wf nad wt 109 > 110 January 04, 2009 > 107 February 05, 2009 >>117 HEENT mild turbinate edema.  Oropharynx no thrush or excess pnd or cobblestoning.  No JVD or cervical adenopathy. Mild accessory muscle hypertrophy. Trachea midline, nl thryroid. Chest coarse BS w/ faint wheeze.   Regular rate and rhythm without murmur gallop or rub  or increase P2 or edema.  Abd: no hsm, nl excursion. Ext warm without cyanosis or clubbing.     Impression & Recommendations:  Problem # 1:  ASTHMA (ICD-493.90)  Mild Flare:  REC:  Omnicef 300mg  two times a day for 7 days Mucinex DM two times a day as needed cough /congestion Increase fluids.  May change back Dexilant daily  follow up Dr. Sherene Sires in 3-4 months Please contact office for sooner follow up if symptoms do not improve or worsen   Orders: Est. Patient Level III (29562)  Medications Added to Medication List This Visit: 1)  Dexilant 60 Mg Cpdr (Dexlansoprazole) .Marland Kitchen.. 1 by mouth once daily 2)  Estrace 0.1 Mg/gm Crea (Estradiol) .... Apply twice a week 3)  Amoxicillin-pot Clavulanate 500-125 Mg Tabs (Amoxicillin-pot clavulanate) .... Take 1 tablet by mouth three times a day - began taking 04-29-09 4)  Lidocaine Hcl 2 % Soln (Lidocaine hcl (local anesth.)) .... As directed by dr. Logan Bores 5)  Sodium Bicarbonate 8.4 % Soln (Sodium bicarbonate) .... As directed by dr. Logan Bores 6)  Heparin Lock Flush 100 Unit/ml Soln (Heparin sodium (porcine)) .... As directed by dr. Logan Bores 7)  Cefdinir 300 Mg Caps (Cefdinir) .Marland Kitchen.. 1 by mouth two times a day  Complete Medication List: 1)  Symbicort 160-4.5 Mcg/act Aero (Budesonide-formoterol fumarate) .... 2 puffs first thing  in am and 2 puffs again in pm about 12 hours later 2)  Vivelle-dot 0.1 Mg/24hr Pttw (Estradiol) .... Change patch every 3 days 3)  Doxazosin Mesylate 4 Mg Tabs (Doxazosin mesylate) .Marland Kitchen.. 1 at bedtime 4)  Alprazolam 0.5 Mg Tabs (Alprazolam) .Marland Kitchen.. 1 three times a day as needed 5)  Amitriptyline Hcl 25 Mg Tabs (Amitriptyline hcl) .... Take 1 tab by mouth at bedtime 6)  Proair Hfa 108 (90 Base) Mcg/act Aers (Albuterol sulfate) .Marland Kitchen.. 1-2 puffs every 4-6 hours as needed if not breathing well 7)  Promethazine Hcl 25 Mg Tabs (Promethazine hcl) .... As needed 8)  Flexeril 5 Mg Tabs (Cyclobenzaprine hcl) .... As needed 9)  Dexilant 60 Mg Cpdr  (Dexlansoprazole) .Marland Kitchen.. 1 by mouth once daily 10)  Lexapro 10 Mg Tabs (Escitalopram oxalate) .... Take 1 tablet by mouth once a day 11)  Zoloft 50 Mg Tabs (Sertraline hcl) .... Take 1 tablet  by mouth once a day 12)  Librax 2.5-5 Mg Caps (Clidinium-chlordiazepoxide) .Marland Kitchen.. 1 by mouth three times a day prn 13)  Anamantle Hc 3-0.5 % Crea (Lidocaine-hydrocortisone ace) .... Aply to rectal area bid 14)  Folic Acid 1 Mg Tabs (Folic acid) .Marland Kitchen.. 1 by mouth qd 15)  Estrace 0.1 Mg/gm Crea (Estradiol) .... Apply twice a week 16)  Amoxicillin-pot Clavulanate 500-125 Mg Tabs (Amoxicillin-pot clavulanate) .... Take 1 tablet by mouth three times a day - began taking 04-29-09 17)  Lidocaine Hcl 2 % Soln (Lidocaine hcl (local anesth.)) .... As directed by dr. Logan Bores 18)  Sodium Bicarbonate 8.4 % Soln (Sodium bicarbonate) .... As directed by dr. Logan Bores 19)  Heparin Lock Flush 100 Unit/ml Soln (Heparin sodium (porcine)) .... As directed by dr. Logan Bores 20)  Cefdinir 300 Mg Caps (Cefdinir) .Marland Kitchen.. 1 by mouth two times a day  Patient Instructions: 1)  Omnicef 300mg  two times a day for 7 days 2)  Mucinex DM two times a day as needed cough /congestion 3)  Increase fluids.  4)  May change back Dexilant daily  5)  follow up Dr. Sherene Sires in 3-4 months 6)  Please contact office for sooner follow up if symptoms do not improve or worsen  Prescriptions: CEFDINIR 300 MG CAPS (CEFDINIR) 1 by mouth two times a day  #14 x 0   Entered and Authorized by:   [name] NP   Signed by:   Contina Strain NP on 05/06/2009   Method used:   Electronically to        [facility] (retail)       [address]       [place], [place]  [address]       Ph: [phone]       Fax: [phone]   RxID:   [id number] DEXILANT 60 MG CPDR (DEXLANSOPRAZOLE) 1 by mouth once daily  #30 x 5   Entered and Authorized by:   [name] NP   Signed by:   Ortencia Askari NP on 05/06/2009   Method used:   Electronically to         [facility] (retail)       [address]       [place], [place]  [address]       Ph: [phone]       Fax: [phone]   RxID:   [id number]    Immunization History:  Influenza Immunization History:    Influenza:  declines (05/06/2009)  Pneumovax Immunization History:    Pneumovax:  n/a (05/06/2009)

## 2010-05-10 NOTE — Consult Note (Signed)
Summary: [facility]  [facility]   Imported By: Lanelle Bal 01/03/2010 08:51:27  _____________________________________________________________________  External Attachment:    Type:   Image     Comment:   External Document

## 2010-05-10 NOTE — Consult Note (Signed)
Summary: [facility]  [facility]   Imported By: Lanelle Bal 12/20/2009 14:15:40  _____________________________________________________________________  External Attachment:    Type:   Image     Comment:   External Document

## 2010-05-10 NOTE — Medication Information (Signed)
Summary: Clarification for [facility]  Clarification for [facility]   Imported By: Sherian Rein 01/14/2009 10:10:30  _____________________________________________________________________  External Attachment:    Type:   Image     Comment:   External Document

## 2010-05-10 NOTE — Medication Information (Signed)
Summary: Prior Authorization & Denial for [facility]  Prior Authorization & Denial for [facility]   Imported By: Lanelle Bal 07/21/2009 13:51:50  _____________________________________________________________________  External Attachment:    Type:   Image     Comment:   External Document

## 2010-05-10 NOTE — Assessment & Plan Note (Signed)
Summary: NP follow up - med calendar   Copy to:  [name], MD Primary Provider/Referring Provider:  [name]  CC:  new med calendar - pt brought all meds with her today.  c/o wheezing, SOB, prod cough with clear/ light yellow mucus.  pt finished round of doxy and pred taper, and symptoms returned x1week ago.Marland Kitchen  History of Present Illness: 35  yowf active smoker no previous chronic resp problems until dx  with AB 2006 and since then using proaire qid as a maintenance med.  December 04, 2008 ov c/w 6 weeks indolent onset progressively worse sensation of wheezing and sob and chest tightness just on left side not much cough, some nausea assoc with hoarsness , worst first thing in am and when lies down.  rec stop smoking and takeSymbicort 160/4.5 2 puffs first thing  in am and 2 puffs again in pm about 12 hours later and fill the prescription if doing better  January 04, 2009 ov cut down on smoking and liked symbicort much less need for saba 1 month followup.  Pt states that she still has alot of wheezing.  She also c/o SOB and chest tightness in the am and states that she gets very dizzy worse  when she tries to do any activity.  rec add dexilant and found that everything was fine until ran out of dexilant   February 05, 2009 ov 3 wk followup.  Pt states that her breathing was doing much better until she ran out of dexilant.  She states that she has been out for 5 days and has noticed incxreased SOB- same as before.  She c/o "constantly nauseas" and "can not keep anything down" x 1 wk.   while on dexilant much better but still required saba 2 -3 x daily and not able to stop smoking.    May 06, 2009--Presents for a 3 month follow up - states breathing was better when she was taking the dexilant rather than the nexium, symbicort has been working well.  has prod cough with clear mucus, chest congestion, increased dyspnea, wheezing x1week. Wants to change back to dexilant. Doing well until last week ,  with thick mucus. rec change to dexilant, short cycle of pred > better  August 04, 2009  c/o increased SOB with exertion and cough with yellow mucus and wheezing x3-4 days.Pt denies any significant sore throat, nasal congestion or excess secretions, fever, chills, sweats, unintended wt loss, pleuritic or exertional cp, orthopnea pnd or leg swelling.  better with saba5/13/11 Presents for follow up and med review. Last visit w/ bronchitic flare tx doxycycline and steroids. She says she got better but wheezing and cough returned after 1 week.  Cough is mainly dry in nature, no fever or discolored mucus. Cough is not as bad as last visit. She continues to smoke. We reviewed her meds and organized them into a medication calendar. Denies chest pain,  orthopnea, hemoptysis, fever, n/v/d, edema, headache.   Medications Prior to Update: 1)  Doxazosin Mesylate 4 Mg Tabs (Doxazosin Mesylate) .Marland Kitchen.. 1 At Bedtime 2)  Amitriptyline Hcl 25 Mg Tabs (Amitriptyline Hcl) .... Take 1 Tab By Mouth At Bedtime 3)  Folic Acid 1 Mg Tabs (Folic Acid) .Marland Kitchen.. 1 By Mouth Daily 4)  Lexapro 10 Mg Tabs (Escitalopram Oxalate) .... Take 1 Tablet By Mouth Once A Day 5)  Symbicort 160-4.5 Mcg/act  Aero (Budesonide-Formoterol Fumarate) .... 2 Puffs First Thing  in Am and 2 Puffs Again in Pm About 12 Hours  Later 6)  Estring 2 Mg Ring (Estradiol) .... As Directed 7)  Vivelle-Dot 0.1 Mg/24hr Pttw (Estradiol) .... Change Patch Every 3 Days 8)  Omeprazole 40 Mg Cpdr (Omeprazole) .... Take 1 Capsule By Mouth Once A Day Before Meal 9)  Diazepam 5 Mg Tabs (Diazepam) .Marland Kitchen.. 1 By Mouth Three Times A Day As Needed 10)  Seroquel Xr 300 Mg Xr24h-Tab (Quetiapine Fumarate) .Marland Kitchen.. 1 By Mouth At Bedtime 11)  Alprazolam 0.5 Mg Tabs (Alprazolam) .Marland Kitchen.. 1 Three Times A Day As Needed 12)  Promethazine Hcl 25 Mg Tabs (Promethazine Hcl) .... As Needed 13)  Proair Hfa 108 (90 Base) Mcg/act  Aers (Albuterol Sulfate) .Marland Kitchen.. 1-2 Puffs Every 4-6 Hours As Needed If  Not Breathing Well 14)  Flexeril 5 Mg Tabs (Cyclobenzaprine Hcl) .... As Needed 15)  Anamantle Hc 3-0.5 % Crea (Lidocaine-Hydrocortisone Ace) .... Aply To Rectal Area Twice Daily 16)  Lidocaine Hcl 2 % Soln (Lidocaine Hcl (Local Anesth.)) .... As Directed By Dr. Logan Bores 17)  Sodium Bicarbonate 8.4 % Soln (Sodium Bicarbonate) .... As Directed By Dr. Logan Bores 18)  Heparin Lock Flush 100 Unit/ml Soln (Heparin Sodium (Porcine)) .... As Directed By Dr. Logan Bores 19)  Librax 2.5-5 Mg Caps (Clidinium-Chlordiazepoxide) .Marland Kitchen.. 1 By Mouth Three Times A Day Prn 20)  Prednisone 10 Mg  Tabs (Prednisone) .... 4 Each Am X 2days, 2x2days, 1x2days and Stop 21)  Doxycycline Hyclate 100 Mg Caps (Doxycycline Hyclate) .... One Twice Daily  Current Medications (verified): 1)  Doxazosin Mesylate 4 Mg Tabs (Doxazosin Mesylate) .Marland Kitchen.. 1 At Bedtime 2)  Amitriptyline Hcl 25 Mg Tabs (Amitriptyline Hcl) .... 2 Tabs By Mouth At Bedtime 3)  Folic Acid 1 Mg Tabs (Folic Acid) .Marland Kitchen.. 1 By Mouth Daily 4)  Lexapro 10 Mg Tabs (Escitalopram Oxalate) .... Take 1 Tablet By Mouth Once A Day 5)  Symbicort 160-4.5 Mcg/act  Aero (Budesonide-Formoterol Fumarate) .... 2 Puffs First Thing  in Am and 2 Puffs Again in Pm About 12 Hours Later 6)  Sertraline Hcl 50 Mg Tabs (Sertraline Hcl) .... Take 1 Tab By Mouth At Bedtime 7)  Estring 2 Mg Ring (Estradiol) .... Every 3 Months 8)  Vivelle-Dot 0.1 Mg/24hr Pttw (Estradiol) .... Change Patch Every 3 Days 9)  Omeprazole 40 Mg Cpdr (Omeprazole) .... Take 1 Capsule By Mouth Once A Day Before Meal 10)  Hydroxyzine Hcl 25 Mg Tabs (Hydroxyzine Hcl) .... 2 Tabs By Mouth At Bedtime 11)  Clidinium-Chlordiazepoxide 2.5-5 Mg Caps (Clidinium-Chlordiazepoxide) .... 2 Capsules By Mouth  Every Morning and 1 At Bedtime 12)  Diazepam 5 Mg Tabs (Diazepam) .Marland Kitchen.. 1 By Mouth Three Times A Day 13)  Seroquel Xr 300 Mg Xr24h-Tab (Quetiapine Fumarate) .Marland Kitchen.. 1 By Mouth At Bedtime 14)  Bladder Flush .... Use Weekly 15)  Oscal  500/200 D-3 500-200 Mg-Unit Tabs (Calcium-Vitamin D) .... Take 1 Tablet By Mouth Two Times A Day 16)  Alprazolam 0.5 Mg Tabs (Alprazolam) .Marland Kitchen.. 1 Tab By Mouth Every 8 Hours As Needed 17)  Promethazine Hcl 25 Mg Tabs (Promethazine Hcl) .... Take 1 Tablet By Mouth Once A Day As Needed 18)  Hydrocortisone 1 % Crea (Hydrocortisone) .... Use As Directed 19)  Renu Saline  Soln (Soft Lens Products) .... Use As Directed 20)  Proair Hfa 108 (90 Base) Mcg/act  Aers (Albuterol Sulfate) .Marland Kitchen.. 1-2 Puffs Every 4-6 Hours As Needed If Not Breathing Well 21)  Mucinex Dm 30-600 Mg Xr12h-Tab (Dextromethorphan-Guaifenesin) .... Take 1-2 Tablets Every 12 Hours As Needed 22)  Zyrtec Allergy  10 Mg Tabs (Cetirizine Hcl) .... Take 1 Tab By Mouth At Bedtime As Needed  Allergies (verified): 1)  ! * Ambien 2)  ! Reglan 3)  ! 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) FIBROMYALGIA (ICD-729.1) ADENOMATOUS COLONIC POLYP (ICD-211.3) SLEEP APNEA (ICD-780.57) NEPHROLITHIASIS (ICD-592.0) DEPRESSION (ICD-311) ARTHRITIS (ICD-716.90) HYPERTENSION (ICD-401.9) Asthmatic Bronchitis..............................................................Marland KitchenWert    - HFA 75%  December 04, 2008 > 75% February 05, 2009 > 75 % August 04, 2009     - PFT's January 04, 2009  2.56 (86%)  ratio 75,  no resp to B2 and DLC0 67% > 80after correction Interstitial Cystitis  Past Surgical History: Last updated: 03/11/2009 hysterectomy cholecystectomy pacemaker in hip for interstitial cystitis  Family History: Last updated: 03/11/2009 Emphysema- MGF Asthma- MGM Heart dz- Father Lung CA- MGF, Aunt No FH of Colon Cancer:  Social History: Last updated: 08/20/2009 Married 2 Children Current smoker since age 31.  Smokes 1/2 ppd. No ETOH Homemaker Daily Caffeine Use  Mt Dew occasional marajuana.   Risk Factors: Alcohol Use: 0 (07/06/2009) Caffeine Use: 6  (07/19/2009)  Risk Factors: Smoking Status: current (07/06/2009) Packs/Day: 0.5 (07/06/2009)  Social History: Married 2 Children Current smoker since age 57.  Smokes 1/2 ppd. No ETOH Homemaker Daily Caffeine Use  Mt Dew occasional marajuana.   Review of Systems      See HPI  Vital Signs:  Patient profile:   35 year old female Height:      65 inches Weight:      108 pounds BMI:     18.04 O2 Sat:      96 % on Room air Temp:     98.3 degrees F oral Pulse rate:   100 / minute BP sitting:   96 / 62  (left arm) Cuff size:   regular  Vitals Entered By: Boone Master CNA (Aug 20, 2009 11:26 AM)  O2 Flow:  Room air CC: new med calendar - pt brought all meds with her today.  c/o wheezing, SOB, prod cough with clear/ light yellow mucus.  pt finished round of doxy and pred taper, symptoms returned x1week ago. Is Patient Diabetic? No Comments Medications reviewed with patient Daytime contact number verified with patient. Boone Master CNA  Aug 20, 2009 11:36 AM    Physical Exam  Additional Exam:  amb wf nad wt 109 > 110 January 04, 2009 > 107 February 05, 2009 >> 114 August 04, 2009 108 08/20/09 HEENT mild turbinate edema.  Oropharynx no thrush or excess pnd or cobblestoning.  No JVD or cervical adenopathy. Mild accessory muscle hypertrophy. Trachea midline, nl thryroid. Chest coarse BS w/ no wheezing   Regular rate and rhythm without murmur gallop or rub or increase P2 or edema.  Abd: no hsm, nl excursion. Ext warm without cyanosis or clubbing.     Impression & Recommendations:  Problem # 1:  BRONCHITIS, RECURRENT (ICD-491.9)  Recurrent bronchitis in smoker.  xray pending.  REC:  May use mucinex dm two times a day as needed cough/congestion  MAIN GOAL IS TO QUIT SMOKING I will call with xray results.  Use sugarless candy, ice chips, water -NO MINTS to help w/ cough.  follow up 4 weeks and as needed Dr. Sherene Sires  Please contact office for sooner follow up if symptoms do not  improve or worsen  Follow med calendar and bring to each visit.   Orders: Est. Patient Level IV (56213)  Medications Added to Medication List This Visit:  1)  Amitriptyline Hcl 25 Mg Tabs (Amitriptyline hcl) .... 2 tabs by mouth at bedtime 2)  Sertraline Hcl 50 Mg Tabs (Sertraline hcl) .... Take 1 tab by mouth at bedtime 3)  Estring 2 Mg Ring (Estradiol) .... Every 3 months 4)  Hydroxyzine Hcl 25 Mg Tabs (Hydroxyzine hcl) .... 2 tabs by mouth at bedtime 5)  Clidinium-chlordiazepoxide 2.5-5 Mg Caps (Clidinium-chlordiazepoxide) .... 2 capsules by mouth  every morning and 1 at bedtime 6)  Diazepam 5 Mg Tabs (Diazepam) .Marland Kitchen.. 1 by mouth three times a day 7)  Bladder Flush  .... Use weekly 8)  Oscal 500/200 D-3 500-200 Mg-unit Tabs (Calcium-vitamin d) .... Take 1 tablet by mouth two times a day 9)  Alprazolam 0.5 Mg Tabs (Alprazolam) .Marland Kitchen.. 1 tab by mouth every 8 hours as needed 10)  Promethazine Hcl 25 Mg Tabs (Promethazine hcl) .... Take 1 tablet by mouth once a day as needed 11)  Hydrocortisone 1 % Crea (Hydrocortisone) .... Use as directed 12)  Renu Saline Soln (Soft lens products) .... Use as directed 13)  Mucinex Dm 30-600 Mg Xr12h-tab (Dextromethorphan-guaifenesin) .... Take 1-2 tablets every 12 hours as needed 14)  Zyrtec Allergy 10 Mg Tabs (Cetirizine hcl) .... Take 1 tab by mouth at bedtime as needed  Complete Medication List: 1)  Doxazosin Mesylate 4 Mg Tabs (Doxazosin mesylate) .Marland Kitchen.. 1 at bedtime 2)  Amitriptyline Hcl 25 Mg Tabs (Amitriptyline hcl) .... 2 tabs by mouth at bedtime 3)  Folic Acid 1 Mg Tabs (Folic acid) .Marland Kitchen.. 1 by mouth daily 4)  Lexapro 10 Mg Tabs (Escitalopram oxalate) .... Take 1 tablet by mouth once a day 5)  Symbicort 160-4.5 Mcg/act Aero (Budesonide-formoterol fumarate) .... 2 puffs first thing  in am and 2 puffs again in pm about 12 hours later 6)  Sertraline Hcl 50 Mg Tabs (Sertraline hcl) .... Take 1 tab by mouth at bedtime 7)  Estring 2 Mg Ring (Estradiol) ....  Every 3 months 8)  Vivelle-dot 0.1 Mg/24hr Pttw (Estradiol) .... Change patch every 3 days 9)  Omeprazole 40 Mg Cpdr (Omeprazole) .... Take 1 capsule by mouth once a day before meal 10)  Hydroxyzine Hcl 25 Mg Tabs (Hydroxyzine hcl) .... 2 tabs by mouth at bedtime 11)  Clidinium-chlordiazepoxide 2.5-5 Mg Caps (Clidinium-chlordiazepoxide) .... 2 capsules by mouth  every morning and 1 at bedtime 12)  Diazepam 5 Mg Tabs (Diazepam) .Marland Kitchen.. 1 by mouth three times a day 13)  Seroquel Xr 300 Mg Xr24h-tab (Quetiapine fumarate) .Marland Kitchen.. 1 by mouth at bedtime 14)  Bladder Flush  .... Use weekly 15)  Oscal 500/200 D-3 500-200 Mg-unit Tabs (Calcium-vitamin d) .... Take 1 tablet by mouth two times a day 16)  Alprazolam 0.5 Mg Tabs (Alprazolam) .Marland Kitchen.. 1 tab by mouth every 8 hours as needed 17)  Promethazine Hcl 25 Mg Tabs (Promethazine hcl) .... Take 1 tablet by mouth once a day as needed 18)  Hydrocortisone 1 % Crea (Hydrocortisone) .... Use as directed 19)  Renu Saline Soln (Soft lens products) .... Use as directed 20)  Proair Hfa 108 (90 Base) Mcg/act Aers (Albuterol sulfate) .Marland Kitchen.. 1-2 puffs every 4-6 hours as needed if not breathing well 21)  Mucinex Dm 30-600 Mg Xr12h-tab (Dextromethorphan-guaifenesin) .... Take 1-2 tablets every 12 hours as needed 22)  Zyrtec Allergy 10 Mg Tabs (Cetirizine hcl) .... Take 1 tab by mouth at bedtime as needed  Other Orders: T-2 View CXR ([id number])  Patient Instructions: 1)  May use mucinex dm two times a  day as needed cough/congestion  2)  MAIN GOAL IS TO QUIT SMOKING 3)  I will call with xray results.  4)  Use sugarless candy, ice chips, water -NO MINTS to help w/ cough.  5)  follow up 4 weeks and as needed Dr. Sherene Sires  6)  Please contact office for sooner follow up if symptoms do not improve or worsen  7)  Follow med calendar and bring to each visit.

## 2010-05-10 NOTE — Assessment & Plan Note (Signed)
Summary: Pulmonary/ summary f/u ov with hfa teaching   Copy to:  Dr. Renaldo Fiddler (GYN) Primary Provider/Referring Provider:  Dr. Megan Mans  CC:  3 wk followup.  Pt states that her breathing was doing much better until she ran out of dexilant.  She states that she has been out for 5 days and has noticed incxreased SOB- same as before.  She c/o "constantly nauseas" and "can not keep anything down" x 1 wk..  History of Present Illness: 62 yowf active smoker no previous chronic resp problems until dx  with AB 2006 and since then using proaire qid as a maintenance med.  December 04, 2008 ov c/w 6 weeks indolent onset progressively worse sensation of wheezing and sob and chest tightness just on left side not much cough, some nausea assoc with hoarsness , worst first thing in am and when lies down.  rec stop smoking and takeSymbicort 160/4.5 2 puffs first thing  in am and 2 puffs again in pm about 12 hours later and fill the prescription if doing better  January 04, 2009 ov cut down on smoking and liked symbicort much less need for saba 1 month followup.  Pt states that she still has alot of wheezing.  She also c/o SOB and chest tightness in the am and states that she gets very dizzy worse  when she tries to do any activity.  rec add dexilant and found that everything was fine until ran out of dexilant   February 05, 2009 ov 3 wk followup.  Pt states that her breathing was doing much better until she ran out of dexilant.  She states that she has been out for 5 days and has noticed incxreased SOB- same as before.  She c/o "constantly nauseas" and "can not keep anything down" x 1 wk.   while on dexilant much better but still required saba 2 -3 x daily and not able to stop smoking.  Pt denies any significant sore throat, dysphagia, itching, sneezing,  nasal congestion or excess secretions,  fever, chills, sweats, unintended wt loss, pleuritic or exertional cp, hempoptysis, change in activity tolerance  orthopnea pnd  or leg swelling   Current Medications (verified): 1)  Symbicort 160-4.5 Mcg/act  Aero (Budesonide-Formoterol Fumarate) .... 2 Puffs First Thing  in Am and 2 Puffs Again in Pm About 12 Hours Later 2)  Vivelle-Dot 0.1 Mg/24hr Pttw (Estradiol) .... Change Patch Every 3 Days 3)  Doxazosin Mesylate 4 Mg Tabs (Doxazosin Mesylate) .Marland Kitchen.. 1 At Bedtime 4)  Alprazolam 0.5 Mg Tabs (Alprazolam) .Marland Kitchen.. 1 Three Times A Day As Needed 5)  Amitriptyline Hcl 25 Mg Tabs (Amitriptyline Hcl) .... Take 1 Tab By Mouth At Bedtime 6)  Proair Hfa 108 (90 Base) Mcg/act  Aers (Albuterol Sulfate) .Marland Kitchen.. 1-2 Puffs Every 4-6 Hours As Needed If Not Breathing Well 7)  Promethazine Hcl 25 Mg Tabs (Promethazine Hcl) .... As Needed 8)  Flexeril 5 Mg Tabs (Cyclobenzaprine Hcl) .... As Needed 9)  Zoloft 50 Mg Tabs (Sertraline Hcl) .Marland Kitchen.. 1 Once Daily  Allergies (verified): 1)  ! * Ambien 2)  ! Reglan 3)  ! 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) FIBROMYALGIA (ICD-729.1) ADENOMATOUS COLONIC POLYP (ICD-211.3) SLEEP APNEA (ICD-780.57) NEPHROLITHIASIS (ICD-592.0) DEPRESSION (ICD-311) ARTHRITIS (ICD-716.90) HYPERTENSION (ICD-401.9) Asthmatic Bronchitis............................................................Marland KitchenWert    - HFA 75%  December 04, 2008 > 75% February 05, 2009     - PFT's January 04, 2009  2.56 (86%)  ratio 75,  no resp to B2 and  DLC0 67% > 80  Vital Signs:  Patient profile:   35 year old female Weight:      107 pounds O2 Sat:      95 % on Room air Temp:     97.9 degrees F oral Pulse rate:   104 / minute BP sitting:   98 / 62  (left arm)  Vitals Entered By: Vernie Murders (February 05, 2009 10:58 AM)  O2 Flow:  Room air  Physical Exam  Additional Exam:  amb wf nad wt 109 > 110 January 04, 2009 > 107 February 05, 2009  HEENT mild turbinate edema.  Oropharynx no thrush or excess pnd or cobblestoning.  No JVD or cervical adenopathy. Mild accessory muscle hypertrophy.  Trachea midline, nl thryroid. Chest was hyperinflated by percussion with diminished breath sounds and moderate increased exp time with prominent  bilateral mid exp sonorous rhonchi. Hoover sign positive at mid inspiration. Regular rate and rhythm without murmur gallop or rub or increase P2 or edema.  Abd: no hsm, nl excursion. Ext warm without cyanosis or clubbing.     Impression & Recommendations:  Problem # 1:  GERD (ICD-530.81) Clearly most of her symptoms are attributable to GERD, much better on rx so resume and re-emphasize lifestyle modification including d/c cigs  The following medications were removed from the medication list:    Dexilant 60 Mg Cpdr (Dexlansoprazole) .Marland Kitchen... Take  one 30-60 min before first meal of the day Her updated medication list for this problem includes:    Dexilant 60 Mg Cpdr (Dexlansoprazole) .Marland Kitchen... Take  one 30-60 min before first meal of the day  Problem # 2:  ASTHMA (ICD-493.90) I spent extra time with the patient today explaining optimal mdi  technique.  This improved from  50-75%  I discussed the importance of understanding the goals of asthma therapy including the rule of 2's as it applies to the use of a rescue inhaler.   Medications Added to Medication List This Visit: 1)  Zoloft 50 Mg Tabs (Sertraline hcl) .Marland Kitchen.. 1 once daily 2)  Dexilant 60 Mg Cpdr (Dexlansoprazole) .... Take  one 30-60 min before first meal of the day  Other Orders: Gastroenterology Referral (GI) Est. Patient Level IV (16109)  Patient Instructions: 1)  GERD (gastroesophageal reflux disease) was discussed. It is a common cause of respiratory symptoms. It commonly presents in the absence of heartburn. GERD can be treated with medication, but also with lifestyle changes including avoidance of late meals, excessive alcohol, smoking cessation, and avoid fatty foods, chocolate, all forms of mint, colas, red wine, and acidic juices such as orange juice. NO MINT OR MENTHOL PRODUCTS (no cough  drops 2)  USE SUGARLESS CANDY INSTEAD (jolley ranchers)  3)  Symbicort 160/4.5 2 puffs first thing  in am and 2 puffs again in pm about 12 hours later and fill the prescription if doing better 4)  Dexilant 60 Take  one 30-60 min before first meal of the day  5)  Work on inhaler technique:  relax and blow all the way out then take a nice smooth deep breath back in, triggering the inhaler at same time you start breathing in  6)  See Patient Care Coordinator before leaving for GI referral 7)  Please schedule a follow-up appointment in 3 months. Prescriptions: SYMBICORT 160-4.5 MCG/ACT  AERO (BUDESONIDE-FORMOTEROL FUMARATE) 2 puffs first thing  in am and 2 puffs again in pm about 12 hours later  #1 x 11   Entered and Authorized by:  Nyoka Cowden MD   Signed by:   Nyoka Cowden MD on 02/05/2009   Method used:   Electronically to        [facility] (retail)       [address]       [place], [place]  [address]       Ph: [phone]       Fax: [phone]   RxID:   [id number] PROAIR HFA 108 (90 BASE) MCG/ACT  AERS (ALBUTEROL SULFATE) 1-2 puffs every 4-6 hours as needed if not breathing well  #1 x 2   Entered and Authorized by:   Nyoka Cowden MD   Signed by:   Nyoka Cowden MD on 02/05/2009   Method used:   Electronically to        [facility] (retail)       [address]       [place], [place]  [address]       Ph: [phone]       Fax: [phone]   RxID:   [id number] DEXILANT 60 MG CPDR (DEXLANSOPRAZOLE) Take  one 30-60 min before first meal of the day  #30 x 3   Entered and Authorized by:   Nyoka Cowden MD   Signed by:   Nyoka Cowden MD on 02/05/2009   Method used:   Electronically to        [facility] (retail)       [address]       [place], [place]  [address]       Ph: [phone]       Fax: [phone]   RxID:   [id number]   Appended Document: Pulmonary/ summary f/u  ov with hfa teaching copy to Dr Megan Mans

## 2010-05-10 NOTE — Consult Note (Signed)
Summary: [facility]  [facility]   Imported By: Lanelle Bal 01/19/2010 11:46:45  _____________________________________________________________________  External Attachment:    Type:   Image     Comment:   External Document

## 2010-05-10 NOTE — Progress Notes (Signed)
Summary: returning call  Phone Note Call from Patient Call back at Home Phone [phone]   Caller: Patient Call For: wert Reason for Call: Talk to Nurse Summary of Call: Returning Leslie's call. Initial call taken by: Lehman Prom,  December 07, 2008 12:53 PM  Follow-up for Phone Call        pt notified of CXR results.  Follow-up by: Carron Curie CMA,  December 07, 2008 12:59 PM

## 2010-05-10 NOTE — Consult Note (Signed)
Summary: [facility]  [facility]   Imported By: Lanelle Bal 11/26/2009 14:06:19  _____________________________________________________________________  External Attachment:    Type:   Image     Comment:   External Document

## 2010-05-10 NOTE — Miscellaneous (Signed)
Summary: Orders Update pft charges  Clinical Lists Changes  Orders: Added new Service order of Carbon Monoxide diffusing w/capacity (94720) - Signed Added new Service order of Lung Volumes (94240) - Signed Added new Service order of Spirometry (Pre & Post) (94060) - Signed 

## 2010-05-10 NOTE — Assessment & Plan Note (Signed)
Summary: gerd...em   History of Present Illness Visit Type: consult Primary GI MD: Sheryn Bison MD FACP FAGA Primary Provider: [name], MD Requesting Provider: [name], MD Chief Complaint: GERD which is helped with Nexium, pt states she is having rectal bleeding, abdominal pain and extreme nausea History of Present Illness:   This patient is a 35 year old white female well followed for many years because of chronic acid reflux managed with Nexium 30 mg a day. 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 She's recently been under a lot of personal stress this had worsened over IBS with abdominal cramping and diarrhea with some recent rectal pain and rectal bleeding. She's had extensive third GI workups which have been unremarkable except for an adenomatous polyp colon polyp that was excised 3 years ago. There is no evidence of inflammatory bowel disease at that time. She has responded well in the past 2 latornex 0.5 mg twice a day but could not afford this medication.  She denies systemic complaints but does have periodic mouth sores. She is on multiple psychotropic drugs including Zoloft 50 mg a day, Lexapro 10 mg a day, Flexeril 5 mg a day, amitriptyline 25 mg a day, alprazolam 0.5 mg 2 t.i.d., p.r.n. Phenergan, Nexium, and various inhalers. She has had recurrent nephrolithiasis and mild hypertension. She is status post cholecystectomy, hysterectomy, and sees Dr. Logan Bores for interstitial cystitis. Patient currently is not under psychiatric care. She allegedly has a psychiatric appointment scheduled.   GI Review of Systems      Denies abdominal pain, acid reflux, belching, bloating, chest pain, dysphagia with liquids, dysphagia with solids, heartburn, loss of appetite, nausea, vomiting, vomiting blood,  weight loss, and  weight gain.      Reports diarrhea, rectal bleeding, and  rectal pain.      Current Medications (verified): 1)  Symbicort 160-4.5 Mcg/act  Aero (Budesonide-Formoterol Fumarate) .... 2 Puffs First Thing  in Am and 2 Puffs Again in Pm About 12 Hours Later 2)  Vivelle-Dot 0.1 Mg/24hr Pttw (Estradiol) .... Change Patch Every 3 Days 3)  Doxazosin Mesylate 4 Mg Tabs (Doxazosin Mesylate) .Marland Kitchen.. 1 At Bedtime 4)  Alprazolam 0.5 Mg Tabs (Alprazolam) .Marland Kitchen.. 1 Three Times A Day As Needed 5)  Amitriptyline Hcl 25 Mg Tabs (Amitriptyline Hcl) .... Take 1 Tab By Mouth At Bedtime 6)  Proair Hfa 108 (90 Base) Mcg/act  Aers (Albuterol Sulfate) .Marland Kitchen.. 1-2 Puffs Every 4-6 Hours As Needed If Not Breathing Well 7)  Promethazine Hcl 25 Mg Tabs (Promethazine Hcl) .... As Needed 8)  Flexeril 5 Mg Tabs (Cyclobenzaprine Hcl) .... As Needed 9)  Zoloft 50 Mg Tabs (Sertraline Hcl) .Marland Kitchen.. 1 Once Daily 10)  Nexium 40 Mg Cpdr (Esomeprazole Magnesium) .... Once Daily 30 To 60 Min Before The First Meal of The Day. 11)  Lexapro 10 Mg Tabs (Escitalopram Oxalate) .... Take 1 Tablet By Mouth Once A Day 12)  Zoloft 50 Mg Tabs (Sertraline Hcl) .... Take 1 Tablet By Mouth Once A Day  Allergies (verified): 1)  ! * Ambien 2)  ! Reglan 3)  ! Darvocet  Past History:  Past medical, surgical, family and social histories (including risk factors) reviewed for relevance to current acute and chronic problems.  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) FIBROMYALGIA (ICD-729.1) ADENOMATOUS COLONIC POLYP (ICD-211.3) SLEEP APNEA (ICD-780.57) NEPHROLITHIASIS (ICD-592.0) DEPRESSION (ICD-311) ARTHRITIS (ICD-716.90) HYPERTENSION (ICD-401.9) Asthmatic  Bronchitis............................................................Marland KitchenWert    - HFA 75%  December 04, 2008 > 75% February 05, 2009     - PFT's January 04, 2009  2.56 (86%)  ratio 75,  no resp to B2 and DLC0 67% > 80 Interstitial Cystitis  Past Surgical  History: hysterectomy cholecystectomy pacemaker in hip for interstitial cystitis  Family History: Reviewed history from 12/04/2008 and no changes required. Emphysema- MGF Asthma- MGM Heart dz- Father Lung CA- MGF, Aunt No FH of Colon Cancer:  Social History: Reviewed history from 12/04/2008 and no changes required. Married Children Current smoker since age 88.  Smokes 1 ppd. No ETOH Homemaker Daily Caffeine Use  Mt Dew  Review of Systems       The patient complains of anxiety-new and fatigue.  The patient denies allergy/sinus, anemia, arthritis/joint pain, back pain, blood in urine, breast changes/lumps, confusion, cough, coughing up blood, depression-new, fainting, fever, headaches-new, hearing problems, heart murmur, heart rhythm changes, itching, menstrual pain, muscle pains/cramps, night sweats, nosebleeds, pregnancy symptoms, shortness of breath, skin rash, sleeping problems, sore throat, swelling of feet/legs, swollen lymph glands, thirst - excessive, urination - excessive, urination changes/pain, urine leakage, vision changes, and voice change.    Vital Signs:  Patient profile:   35 year old female Height:      64 inches Weight:      112 pounds BMI:     19.29 Pulse rate:   96 / minute Pulse rhythm:   regular BP sitting:   104 / 70  (left arm) Cuff size:   regular  Vitals Entered By: Francee Piccolo CMA Duncan Dull) (March 11, 2009 10:52 AM)  Physical Exam  General:  Well developed, well nourished, no acute distress.There are multiple tattoos on her trunk and extremities. Head:  Normocephalic and atraumatic. Eyes:  PERRLA, no icterus.exam deferred to patient's ophthalmologist.   Neck:  Supple; no masses or thyromegaly. Lungs:  Clear throughout to auscultation. Heart:  Regular rate and rhythm; no murmurs, rubs,  or bruits. Abdomen:  Soft, nontender and nondistended. No masses, hepatosplenomegaly or hernias noted. Normal bowel sounds. Rectal:  inspection of her  rectum is unremarkable without obvious rashes or fistulae. Rectal exam was difficult but marked tenderness posteriorly but there was no diarrhea type stool in her rectal vault and the stool is guaiac negative. Msk:  Symmetrical with no gross deformities. Normal posture. Pulses:  Normal pulses noted. Extremities:  No clubbing, cyanosis, edema or deformities noted. Neurologic:  Alert and  oriented x4;  grossly normal neurologically. Cervical Nodes:  No significant cervical adenopathy. Inguinal Nodes:  No significant inguinal adenopathy. Psych:  Alert and cooperative. Normal mood and affect.   Impression & Recommendations:  Problem # 1:  ANAL FISSURE (ICD-565.0) Assessment Deteriorated She appears to have a superficial posterior fissure and I prescribed local Analmantle cream twice a day as tolerated.  Problem # 2:  IBS (ICD-564.1) Assessment: Deteriorated Trial of Librax one p.o. t.i.d. Ideally she responded previously to latornex but this apparently is not an option financially. I scheduled her for followup colonoscopy with propofol sedation because of her numerous psychotropic medications. Also at that time exam we'll exclude underlying colitis and I will ask Dr. Christella Hartigan to obtain random biopsies to exclude microscopic-collagenous colitis. Screening lab parameters have been ordered today. I also advised this patient to avoid sorbitol and fructose in her diet. Previous evaluation for celiac disease was negative but I will repeat her serologic data today. Orders: TLB-CBC Platelet - w/Differential (85025-CBCD) TLB-BMP (Basic Metabolic Panel-BMET) (80048-METABOL) TLB-Hepatic/Liver  Function Pnl (80076-HEPATIC) TLB-TSH (Thyroid Stimulating Hormone) (84443-TSH) TLB-B12, Serum-Total ONLY (16109-U04) TLB-Ferritin (82728-FER) TLB-Folic Acid (Folate) (82746-FOL) TLB-IBC Pnl (Iron/FE;Transferrin) (83550-IBC) TLB-IgA (Immunoglobulin A) (82784-IGA) TLB-Sedimentation Rate (ESR) (85652-ESR) T-Sprue  Panel (Celiac Disease Aby Eval) (83516x3/[address])  Problem # 3:  COLONIC POLYPS, HX OF (ICD-V12.72) Assessment: Unchanged as per above. Orders: TLB-CBC Platelet - w/Differential (85025-CBCD) TLB-BMP (Basic Metabolic Panel-BMET) (80048-METABOL) TLB-Hepatic/Liver Function Pnl (80076-HEPATIC) TLB-TSH (Thyroid Stimulating Hormone) (84443-TSH) TLB-B12, Serum-Total ONLY (54098-J19) TLB-Ferritin (82728-FER) TLB-Folic Acid (Folate) (82746-FOL) TLB-IBC Pnl (Iron/FE;Transferrin) (83550-IBC) TLB-IgA (Immunoglobulin A) (82784-IGA) TLB-Sedimentation Rate (ESR) (85652-ESR) T-Sprue Panel (Celiac Disease Aby Eval) (83516x3/[address])  Problem # 4:  DEPRESSION (ICD-311) Assessment: Unchanged I have urged patient to proceed with psychiatric evaluation. She is on multiple psychotropic medications and antidepressants which need to be consolidated and better managed.  Problem # 5:  CHRONIC INTERSTITIAL CYSTITIS (ICD-595.1) Assessment: Improved Continued urologic followup and management as per Dr. Logan Bores.  Patient Instructions: 1)  Copy sent to : Dr. [name] and Dr. Marcelyn Bruins in urology 2)  Please continue current medications.  3)  Colonoscopy and Flexible Sigmoidoscopy brochure given.  4)  Conscious Sedation brochure given.   Appended Document: gerd...em    Clinical Lists Changes  Medications: Added new medication of OSMOPREP 1.102-0.398 GM  TABS (SOD PHOS MONO-SOD PHOS DIBASIC) As per prep instructions. - Signed Added new medication of LIBRAX 2.5-5 MG CAPS (CLIDINIUM-CHLORDIAZEPOXIDE) 1 by mouth three times a day prn - Signed Added new medication of ANAMANTLE HC 3-0.5 % CREA (LIDOCAINE-HYDROCORTISONE ACE) Aply to rectal area bid - Signed Rx of OSMOPREP 1.102-0.398 GM  TABS (SOD PHOS MONO-SOD PHOS DIBASIC) As per prep instructions.;  #32 x 0;  Signed;  Entered by: Ashok Cordia RN;  Authorized by: Mardella Layman MD [facility];  Method used: Electronically to [facility] [address], [place], [place], [place]  [address], Ph: [phone], Fax: [phone] Rx of LIBRAX 2.5-5 MG CAPS (CLIDINIUM-CHLORDIAZEPOXIDE) 1 by mouth three times a day prn;  #90 x 6;  Signed;  Entered by: Ashok Cordia RN;  Authorized by: Mardella Layman MD [facility];  Method used: Electronically to [facility] [address] [place], [place], [place]  [address], Ph: [phone], Fax: [phone] Rx of ANAMANTLE HC 3-0.5 % CREA (LIDOCAINE-HYDROCORTISONE ACE) Aply to rectal area bid;  #45 gm x 2;  Signed;  Entered by: Ashok Cordia RN;  Authorized by: Mardella Layman MD [facility];  Method used: Electronically to [facility] [address], [place], [place], [place]  [address], Ph: [phone], Fax: [phone] Orders: Added new Test order of ZCOL (ZCOL) - Signed    Prescriptions: ANAMANTLE HC 3-0.5 % CREA (LIDOCAINE-HYDROCORTISONE ACE) Aply to rectal area bid  #45 gm x 2   Entered by:   Ashok Cordia RN   Authorized by:   Mardella Layman MD [facility]   Signed by:   Ashok Cordia RN on 03/11/2009   Method used:   Electronically to        [facility] (retail)       [address]       [place], [place]  [address]       Ph: [phone]       Fax: [phone]   RxID:   [id number] LIBRAX 2.5-5 MG CAPS (CLIDINIUM-CHLORDIAZEPOXIDE) 1 by mouth three times a day prn  #90 x 6   Entered by:  Ashok Cordia RN   Authorized by:   Mardella Layman MD [facility]   Signed by:   Ashok Cordia RN on 03/11/2009   Method used:   Electronically to        [facility] (retail)       [address]       [place], [place]  [address]       Ph: [phone]       Fax: [phone]   RxID:   [id number] OSMOPREP 1.102-0.398 GM  TABS (SOD PHOS MONO-SOD PHOS DIBASIC) As per prep instructions.  #32 x 0   Entered by:   Ashok Cordia RN   Authorized by:   Mardella Layman MD [facility]   Signed by:   Ashok Cordia RN on 03/11/2009   Method used:    Electronically to        [facility] (retail)       [address]       [place], [place]  [address]       Ph: [phone]       Fax: [phone]   RxID:   [id number]

## 2010-05-10 NOTE — Progress Notes (Signed)
Summary: dexilant not received at pharmacy  Phone Note Call from Patient Call back at Home Phone [phone]   Caller: Patient Call For: parrett Summary of Call: pharmacy does not have nexium Initial call taken by: Rickard Patience,  May 10, 2009 3:25 PM  Follow-up for Phone Call        at last ov on 05/06/09, TP recomended pt to changed back to dexilant.  [facility] Crystal Jones RN  May 10, 2009 3:34 PM  pt called back...please return her call.  Follow-up by: Eugene Gavia,  May 10, 2009 4:38 PM  Additional Follow-up for Phone Call Additional follow up Details #1::        attempted to call pt back but no signal on her phone.  will try back later [facility]  May 10, 2009 5:12 PM   called spoke with patient, she states that it was the dexilant that the pharmacy never received from the OV.  i asked pt if she did the abx that was sent, which she affirmed.  told pt will call the dexilant personally to her verified pharmacy.  gave script to pharmacy and updated on pt's med list. Boone Master CNA  May 12, 2009 3:29 PM     Prescriptions: DEXILANT 60 MG CPDR (DEXLANSOPRAZOLE) 1 by mouth once daily  #30 x 5   Entered by:   Boone Master CNA   Authorized by:   [name] NP   Signed by:   Boone Master CNA on 05/12/2009   Method used:   Telephoned to ...       [facility] (retail)       [address]       [place], [place]  [address]       Ph: [phone]       Fax: [phone]   RxID:   [id number]

## 2010-05-10 NOTE — Procedures (Signed)
Summary: Colon Prep [name]  Colon Prep [name]   Imported By: Lester [place] 04/16/2009 10:13:35  _____________________________________________________________________  External Attachment:    Type:   Image     Comment:   External Document

## 2010-05-10 NOTE — Procedures (Signed)
Summary: Gastroenterology COLON  Gastroenterology COLON   Imported By: Thereasa Solo 08/31/2007 11:51:51  _____________________________________________________________________  External Attachment:    Type:   Image     Comment:   External Document

## 2010-05-10 NOTE — Assessment & Plan Note (Signed)
Summary: to be est. , appt. made by Dr.Adkins,gretchen- jr   Vital Signs:  Patient profile:   35 year old female Height:      65 inches Weight:      113 pounds Temp:     97.8 degrees F oral Pulse rate:   76 / minute BP sitting:   100 / 70  (left arm)  Vitals Entered By: Jeremy Johann CMA (July 06, 2009 2:38 PM) CC: new to establish, depression,fatigue, anemia, ? DM   History of Present Illness: Pt here c/o extreme fatigue and depression.  Pt dx with fibromyalgia  but Dr Kellie Simmering a few years ago.    Pt is requesting labs but she is not fasting.    Preventive Screening-Counseling & Management  Alcohol-Tobacco     Alcohol drinks/day: 0     Smoking Status: current     Smoking Cessation Counseling: yes     Smoke Cessation Stage: contemplative     Packs/Day: 0.5     Year Started: 1999  Caffeine-Diet-Exercise     Caffeine use/day: 6      Sexual History:  currently monogamous and married.    Current Medications (verified): 1)  Symbicort 160-4.5 Mcg/act  Aero (Budesonide-Formoterol Fumarate) .... 2 Puffs First Thing  in Am and 2 Puffs Again in Pm About 12 Hours Later 2)  Vivelle-Dot 0.1 Mg/24hr Pttw (Estradiol) .... Change Patch Every 3 Days 3)  Doxazosin Mesylate 4 Mg Tabs (Doxazosin Mesylate) .Marland Kitchen.. 1 At Bedtime 4)  Alprazolam 0.5 Mg Tabs (Alprazolam) .Marland Kitchen.. 1 Three Times A Day As Needed 5)  Amitriptyline Hcl 25 Mg Tabs (Amitriptyline Hcl) .... Take 1 Tab By Mouth At Bedtime 6)  Proair Hfa 108 (90 Base) Mcg/act  Aers (Albuterol Sulfate) .Marland Kitchen.. 1-2 Puffs Every 4-6 Hours As Needed If Not Breathing Well 7)  Promethazine Hcl 25 Mg Tabs (Promethazine Hcl) .... As Needed 8)  Flexeril 5 Mg Tabs (Cyclobenzaprine Hcl) .... As Needed 9)  Omeprazole 40 Mg Cpdr (Omeprazole) .... Once Daily 10)  Lexapro 10 Mg Tabs (Escitalopram Oxalate) .... Take 1 Tablet By Mouth Once A Day 11)  Librax 2.5-5 Mg Caps (Clidinium-Chlordiazepoxide) .Marland Kitchen.. 1 By Mouth Three Times A Day Prn 12)  Anamantle Hc 3-0.5 %  Crea (Lidocaine-Hydrocortisone Ace) .... Aply To Rectal Area Bid 13)  Folic Acid 1 Mg Tabs (Folic Acid) .Marland Kitchen.. 1 By Mouth Qd 14)  Estrace 0.1 Mg/gm Crea (Estradiol) .... Apply Twice A Week 15)  Lidocaine Hcl 2 % Soln (Lidocaine Hcl (Local Anesth.)) .... As Directed By Dr. Logan Bores 16)  Sodium Bicarbonate 8.4 % Soln (Sodium Bicarbonate) .... As Directed By Dr. Logan Bores 17)  Heparin Lock Flush 100 Unit/ml Soln (Heparin Sodium (Porcine)) .... As Directed By Dr. Logan Bores 18)  Cefdinir 300 Mg Caps (Cefdinir) .Marland Kitchen.. 1 By Mouth Two Times A Day 19)  Abilify 5 Mg Tabs (Aripiprazole) .Marland Kitchen.. 1 By Mouth Once Daily 20)  Abilify 2 Mg Tabs (Aripiprazole) .Marland Kitchen.. 1 By Mouth At Bedtime  Allergies: 1)  ! * Ambien 2)  ! Reglan 3)  ! 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.3) SLEEP APNEA (ICD-780.57) NEPHROLITHIASIS (ICD-592.0) DEPRESSION (ICD-311) ARTHRITIS (ICD-716.90) HYPERTENSION (ICD-401.9) Asthmatic Bronchitis............................................................Marland KitchenWert    - HFA 75%  December 04, 2008 > 75% February 05, 2009     - PFT's January 04, 2009  2.56 (86%)  ratio 75,  no resp to B2 and DLC0 67% > 80 Interstitial Cystitis  Past  Surgical History: Last updated: 03/11/2009 hysterectomy cholecystectomy pacemaker in hip for interstitial cystitis  Family History: Last updated: 03/11/2009 Emphysema- MGF Asthma- MGM Heart dz- Father Lung CA- MGF, Aunt No FH of Colon Cancer:  Social History: Last updated: 05/06/2009 Married Children Current smoker since age 21.  Smokes 1/2 ppd. No ETOH Homemaker Daily Caffeine Use  Mt Dew occasional marajuana.   Risk Factors: Alcohol Use: 0 (07/06/2009) Caffeine Use: 6 (07/06/2009)  Risk Factors: Smoking Status: current (07/06/2009) Packs/Day: 0.5 (07/06/2009)  Family History: Reviewed history from 03/11/2009 and no  changes required. Emphysema- MGF Asthma- MGM Heart dz- Father Lung CA- MGF, Aunt No FH of Colon Cancer:  Social History: Reviewed history from 05/06/2009 and no changes required. Married Children Current smoker since age 44.  Smokes 1/2 ppd. No ETOH Homemaker Daily Caffeine Use  Mt Dew occasional marajuana. Caffeine use/day:  6 Sexual History:  currently monogamous, married Packs/Day:  0.5  Review of Systems      See HPI  Physical Exam  General:  Well-developed,well-nourished,in no acute distress; alert,appropriate and cooperative throughout examination Eyes:  pupils equal, pupils round, and pupils reactive to light.   Lungs:  Normal respiratory effort, chest expands symmetrically. Lungs are clear to auscultation, no crackles or wheezes. Heart:  normal rate and no murmur.   Extremities:  No clubbing, cyanosis, edema, or deformity noted with normal full range of motion of all joints.   Neurologic:  alert & oriented X3, strength normal in all extremities, and gait normal.   Psych:  Oriented X3, normally interactive, and good eye contact.     Impression & Recommendations:  Problem # 1:  FATIGUE (ICD-780.79)  Orders: Venipuncture (16109) TLB-B12 + Folate Pnl (60454_09811-B14/NWG) TLB-Lipid Panel (80061-LIPID) TLB-BMP (Basic Metabolic Panel-BMET) (80048-METABOL) TLB-CBC Platelet - w/Differential (85025-CBCD) TLB-Hepatic/Liver Function Pnl (80076-HEPATIC) TLB-TSH (Thyroid Stimulating Hormone) (84443-TSH) T-Vitamin D (25-Hydroxy) (95621-30865)  Problem # 2:  DEPRESSION (ICD-311)  add abilify 2 mg daily The following medications were removed from the medication list:    Zoloft 50 Mg Tabs (Sertraline hcl) .Marland Kitchen... Take 1 tablet by mouth once a day Her updated medication list for this problem includes:    Alprazolam 0.5 Mg Tabs (Alprazolam) .Marland Kitchen... 1 three times a day as needed    Amitriptyline Hcl 25 Mg Tabs (Amitriptyline hcl) .Marland Kitchen... Take 1 tab by mouth at bedtime    Lexapro  10 Mg Tabs (Escitalopram oxalate) .Marland Kitchen... Take 1 tablet by mouth once a day  Orders: Venipuncture (78469) TLB-B12 + Folate Pnl (62952_84132-G40/NUU) TLB-Lipid Panel (80061-LIPID) TLB-BMP (Basic Metabolic Panel-BMET) (80048-METABOL) TLB-CBC Platelet - w/Differential (85025-CBCD) TLB-Hepatic/Liver Function Pnl (80076-HEPATIC) TLB-TSH (Thyroid Stimulating Hormone) (84443-TSH) T-Vitamin D (25-Hydroxy) (72536-64403)  Discussed treatment options, including trial of antidpressant medication. Will refer to behavioral health. Follow-up call in in 24-48 hours and recheck in 2 weeks, sooner as needed. Patient agrees to call if any worsening of symptoms or thoughts of doing harm arise. Verified that the patient has no suicidal ideation at this time.   Problem # 3:  FIBROMYALGIA (ICD-729.1)  Her updated medication list for this problem includes:    Flexeril 5 Mg Tabs (Cyclobenzaprine hcl) .Marland Kitchen... As needed  Orders: Venipuncture (47425) TLB-B12 + Folate Pnl (95638_75643-P29/JJO) TLB-Lipid Panel (80061-LIPID) TLB-BMP (Basic Metabolic Panel-BMET) (80048-METABOL) TLB-CBC Platelet - w/Differential (85025-CBCD) TLB-Hepatic/Liver Function Pnl (80076-HEPATIC) TLB-TSH (Thyroid Stimulating Hormone) (84443-TSH) T-Vitamin D (25-Hydroxy) (84166-06301)  Complete Medication List: 1)  Symbicort 160-4.5 Mcg/act Aero (Budesonide-formoterol fumarate) .... 2 puffs first thing  in am and 2 puffs again in pm about  12 hours later 2)  Vivelle-dot 0.1 Mg/24hr Pttw (Estradiol) .... Change patch every 3 days 3)  Doxazosin Mesylate 4 Mg Tabs (Doxazosin mesylate) .Marland Kitchen.. 1 at bedtime 4)  Alprazolam 0.5 Mg Tabs (Alprazolam) .Marland Kitchen.. 1 three times a day as needed 5)  Amitriptyline Hcl 25 Mg Tabs (Amitriptyline hcl) .... Take 1 tab by mouth at bedtime 6)  Proair Hfa 108 (90 Base) Mcg/act Aers (Albuterol sulfate) .Marland Kitchen.. 1-2 puffs every 4-6 hours as needed if not breathing well 7)  Promethazine Hcl 25 Mg Tabs (Promethazine hcl) .... As  needed 8)  Flexeril 5 Mg Tabs (Cyclobenzaprine hcl) .... As needed 9)  Omeprazole 40 Mg Cpdr (Omeprazole) .... Once daily 10)  Lexapro 10 Mg Tabs (Escitalopram oxalate) .... Take 1 tablet by mouth once a day 11)  Librax 2.5-5 Mg Caps (Clidinium-chlordiazepoxide) .Marland Kitchen.. 1 by mouth three times a day prn 12)  Anamantle Hc 3-0.5 % Crea (Lidocaine-hydrocortisone ace) .... Aply to rectal area bid 13)  Folic Acid 1 Mg Tabs (Folic acid) .Marland Kitchen.. 1 by mouth qd 14)  Estrace 0.1 Mg/gm Crea (Estradiol) .... Apply twice a week 15)  Lidocaine Hcl 2 % Soln (Lidocaine hcl (local anesth.)) .... As directed by dr. Logan Bores 16)  Sodium Bicarbonate 8.4 % Soln (Sodium bicarbonate) .... As directed by dr. Logan Bores 17)  Heparin Lock Flush 100 Unit/ml Soln (Heparin sodium (porcine)) .... As directed by dr. Logan Bores 18)  Cefdinir 300 Mg Caps (Cefdinir) .Marland Kitchen.. 1 by mouth two times a day 19)  Abilify 5 Mg Tabs (Aripiprazole) .Marland Kitchen.. 1 by mouth once daily 20)  Abilify 2 Mg Tabs (Aripiprazole) .Marland Kitchen.. 1 by mouth at bedtime  Patient Instructions: 1)  rto 2 weeks Prescriptions: ABILIFY 2 MG TABS (ARIPIPRAZOLE) 1 by mouth at bedtime  #30 x 0   Entered and Authorized by:   Loreen Freud DO   Signed by:   Loreen Freud DO on 07/06/2009   Method used:   Print then Give to Patient   RxID:   [id number]

## 2010-05-10 NOTE — Consult Note (Signed)
Summary: [facility]  [facility]   Imported By: Lanelle Bal 02/15/2010 12:21:54  _____________________________________________________________________  External Attachment:    Type:   Image     Comment:   External Document

## 2010-05-10 NOTE — Letter (Signed)
Summary: Patient Notice- Colon Biospy Results  [facility]  [address] [place], [place] [address]   Phone: [phone]  Fax: [phone]        April 16, 2009 MRN: [id number]    [name] [address] [place], [place]  [address]    Dear [name],  I am pleased to inform you that the biopsies taken during your recent colonoscopy did not show any evidence of cancer upon pathologic examination.  Additional information/recommendations:  x__No further action is needed at this time.  Please follow-up with      your primary care physician for your other healthcare needs.  __Please call [phone] to schedule a return visit to review      your condition.  __Continue with the treatment plan as outlined on the day of your      exam.  __You should have a repeat colonoscopy examination for this problem           in _ years.  Please call us if you are having persistent problems or have questions about your condition that have not been fully answered at this time.  Sincerely,  Mardella Layman MD [facility]   This letter has been electronically signed by your physician.  Appended Document: Patient Notice- Colon Biospy Results Letter mailed 01.10.11

## 2010-05-10 NOTE — Progress Notes (Signed)
Summary: dexilant changed to omeprazole  Phone Note Call from Patient Call back at Home Phone [phone]   Caller: Patient Call For: parrett Summary of Call: Having problems getting her dexilant at pharmacy. Please advise.//[facility] Initial call taken by: Darletta Moll,  May 13, 2009 4:41 PM  Follow-up for Phone Call        [facility] Gweneth Dimitri RN  May 13, 2009 4:47 PM  spoke with pt.  Pt states she was tried to pick up dexilant from pharm but was told it needed a Prior Auth.  Pt requesting samples until this is taken care of- informed 3 samples of dexilant are at front to pick up.  Gweneth Dimitri RN  May 13, 2009 5:02 PM   Additional Follow-up for Phone Call Additional follow up Details #1::        called pt's insurance.  Pt has previously tried and failed nexium, now she must try and fail either omeprazole or prevacid.  Pt ok to change to omeprazole and aware [facility] [place].  Ok per TP to change dexilant to omperazole 40mg  once daily . Additional Follow-up by: Gweneth Dimitri RN,  May 13, 2009 5:19 PM    New/Updated Medications: OMEPRAZOLE 40 MG CPDR (OMEPRAZOLE) once daily Prescriptions: OMEPRAZOLE 40 MG CPDR (OMEPRAZOLE) once daily  #30 x 1   Entered by:   Gweneth Dimitri RN   Authorized by:   [name] NP   Signed by:   Gweneth Dimitri RN on 05/13/2009   Method used:   Electronically to        [facility]       [address]       [place], [place]  [address]       Ph: [phone]       Fax: [phone]   RxID:   [id number]

## 2010-05-10 NOTE — Procedures (Signed)
Summary: COLON prep/[facility]  COLON prep/ Gastro   Imported By: Lester [place] 04/15/2009 08:16:57  _____________________________________________________________________  External Attachment:    Type:   Image     Comment:   External Document

## 2010-05-10 NOTE — Progress Notes (Signed)
Summary: PRIOR AUTH denied for ABILIFY left msg for pt 3 x samples up f  Phone Note Refill Request Message from:  Fax from Pharmacy on July 08, 2009 8:46 AM  Refills Requested: Medication #1:  ABILIFY 2 MG TABS 1 by mouth at bedtime. PRIOR AUTHOR [phone]   Method Requested: Fax to Local Pharmacy Next Appointment Scheduled: 07/19/2009 Initial call taken by: Barb Merino,  July 08, 2009 8:47 AM  Follow-up for Phone Call        awaiting fax................Marland KitchenFelecia Deloach CMA  July 08, 2009 9:45 AM  prior auth faxed back awaiting response...........Marland KitchenFelecia Deloach CMA  July 08, 2009 11:24 AM  Prior auth in process .Kandice Hams  July 13, 2009 9:13 AM   Additional Follow-up for Phone Call Additional follow up Details #1::        prior auth DENIED for ABILIFY  Reason;  absence of documentation that patient can not be adequately managed with Risperdal or Seroquel, or has an intolerance to these drugs --Please advise Additional Follow-up by: Kandice Hams,  July 16, 2009 10:01 AM    Additional Follow-up for Phone Call Additional follow up Details #2::    Let pt know insurance will not pay for abilify unless she tries another first----try seroquel xr 50 mg 1 by mouth at bedtime ----can give samples Follow-up by: Loreen Freud DO,  July 16, 2009 11:58 AM  Additional Follow-up for Phone Call Additional follow up Details #3:: Details for Additional Follow-up Action Taken: left msg for pt to call re Abilify .Kandice Hams  July 16, 2009 1:01 PM pt seen yesterday by Dr [name]   Additional Follow-up by: Kandice Hams,  July 20, 2009 5:03 PM

## 2010-05-10 NOTE — Procedures (Signed)
Summary: Colonoscopy  COLONOSCOPY PROCEDURE REPORT  PATIENT:  [name], [name]  MR#:  [id number] BIRTHDATE:   09-06-1975, 33 yrs. old   GENDER:   female  ENDOSCOPIST:   Rachael Fee, MD Referred by: Vania Rea. Jarold Motto, M.D.  PROCEDURE DATE:  04/15/2009 PROCEDURE:  Colonoscopy with biopsy ASA CLASS:   Class II INDICATIONS: chronic diarrhea (also adenomatous polyp removed 3 years ago by Dr. Jarold Motto)  MEDICATIONS:    MAC sedation, administered by CRNA  DESCRIPTION OF PROCEDURE:   After the risks benefits and alternatives of the procedure were thoroughly explained, informed consent was obtained.  Digital rectal exam was performed and revealed no rectal masses.   The EC-[id number] ([id number]) endoscope was introduced through the anus and advanced to the terminal ileum which was intubated for a short distance, without limitations.  The quality of the prep was adequate, using Osmoprep.  The instrument was then slowly withdrawn as the colon was fully examined. <<PROCEDUREIMAGES>>    <<OLD IMAGES>>  FINDINGS:  The terminal ileum appeared normal (see image004).  A normal appearing cecum, ileocecal valve, and appendiceal orifice were identified. The ascending, hepatic flexure, transverse, splenic flexure, descending, sigmoid colon, and rectum appeared unremarkable (see image003 and image005).   Retroflexed views in the rectum revealed not done due to small rectal vault.  Anus was well visualized however, there were some small anal papillea but no obvious fissures or hemorrhoids.  The scope was then withdrawn from the patient and the procedure completed.  COMPLICATIONS:   None  ENDOSCOPIC IMPRESSION:  1) Normal terminal ileum; no signs of Crohn's disease  2) Normal colon; no macroscopic colitis, polyps or cancers.  Random colon biopsies were taken to check for microscopic colitis.   RECOMMENDATIONS:  Await final colon biopsies.  I will communicate these results with Dr. Jarold Motto.  REPEAT EXAM:    Will need repeat colonoscopy in 5 years given personal history of adenomatous (pre-cancerous) colon polyps.   _______________________________ Rachael Fee, MD  Appended Document: Colonoscopy    Clinical Lists Changes  Observations: Added new observation of COLONNXTDUE: 04/2014 (04/19/2009 9:46)      Appended Document: Colonoscopy recall in IDX and EMR

## 2010-05-12 NOTE — Consult Note (Signed)
Summary: [facility]  [facility]   Imported By: Lanelle Bal 05/05/2010 13:08:36  _____________________________________________________________________  External Attachment:    Type:   Image     Comment:   External Document

## 2010-05-12 NOTE — Letter (Signed)
Summary: [facility]  [facility]   Imported By: Lanelle Bal 05/03/2010 12:48:29  _____________________________________________________________________  External Attachment:    Type:   Image     Comment:   External Document

## 2010-05-12 NOTE — Assessment & Plan Note (Signed)
Summary: rib hurts /cbs   Vital Signs:  Patient profile:   35 year old female Weight:      103.4 pounds Pulse rate:   80 / minute Pulse rhythm:   regular BP sitting:   120 / 72  (right arm) Cuff size:   regular  Vitals Entered By: Almeta Monas CMA Duncan Dull) (May 04, 2010 12:01 PM)   History of Present Illness: Pt here after receiving Heimlech manuver---pt c/o L rib pain --esp with deep breathe.  No other complaints.    Current Medications (verified): 1)  Doxazosin Mesylate 4 Mg Tabs (Doxazosin Mesylate) .Marland Kitchen.. 1 At Bedtime 2)  Amitriptyline Hcl 25 Mg Tabs (Amitriptyline Hcl) .... 2 Tabs By Mouth At Bedtime 3)  Folic Acid 1 Mg Tabs (Folic Acid) .Marland Kitchen.. 1 By Mouth Daily...must Have Office Visit 4)  Lexapro 10 Mg Tabs (Escitalopram Oxalate) .... Take 1 Tablet By Mouth Once A Day 5)  Symbicort 160-4.5 Mcg/act  Aero (Budesonide-Formoterol Fumarate) .... 2 Puffs First Thing  in Am and 2 Puffs Again in Pm About 12 Hours Later 6)  Sertraline Hcl 50 Mg Tabs (Sertraline Hcl) .... Take 1 Tab By Mouth At Bedtime 7)  Estring 2 Mg Ring (Estradiol) .... Every 3 Months 8)  Vivelle-Dot 0.1 Mg/24hr Pttw (Estradiol) .... Change Patch Every 3 Days 9)  Omeprazole 40 Mg Cpdr (Omeprazole) .... Take 1 Capsule By Mouth Once A Day Before Meal 10)  Hydroxyzine Hcl 25 Mg Tabs (Hydroxyzine Hcl) .... 2 Tabs By Mouth At Bedtime 11)  Clidinium-Chlordiazepoxide 2.5-5 Mg Caps (Clidinium-Chlordiazepoxide) .... 2 Capsules By Mouth  Every Morning and 1 At Bedtime 12)  Diazepam 5 Mg Tabs (Diazepam) .Marland Kitchen.. 1 By Mouth Three Times A Day 13)  Seroquel Xr 300 Mg Xr24h-Tab (Quetiapine Fumarate) .Marland Kitchen.. 1 By Mouth At Bedtime 14)  Bladder Flush .... Use Weekly 15)  Oscal 500/200 D-3 500-200 Mg-Unit Tabs (Calcium-Vitamin D) .... Take 1 Tablet By Mouth Two Times A Day 16)  Alprazolam 0.5 Mg Tabs (Alprazolam) .Marland Kitchen.. 1 Tab By Mouth Every 8 Hours As Needed 17)  Promethazine Hcl 25 Mg Tabs (Promethazine Hcl) .... Take 1 Tablet By  Mouth Once A Day As Needed 18)  Hydrocortisone 1 % Crea (Hydrocortisone) .... Use As Directed 19)  Renu Saline  Soln (Soft Lens Products) .... Use As Directed 20)  Proair Hfa 108 (90 Base) Mcg/act  Aers (Albuterol Sulfate) .Marland Kitchen.. 1-2 Puffs Every 4-6 Hours As Needed If Not Breathing Well 21)  Mucinex Dm 30-600 Mg Xr12h-Tab (Dextromethorphan-Guaifenesin) .... Take 1-2 Tablets Every 12 Hours As Needed 22)  Zyrtec Allergy 10 Mg Tabs (Cetirizine Hcl) .... Take 1 Tab By Mouth At Bedtime As Needed 23)  Vicodin Es 7.5-750 Mg Tabs (Hydrocodone-Acetaminophen) .Marland Kitchen.. 1 By Mouth Every 6 Hours As Needed  Allergies (verified): 1)  ! * Ambien 2)  ! Reglan 3)  ! 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) FIBROMYALGIA (ICD-729.1) ADENOMATOUS COLONIC POLYP (ICD-211.3) SLEEP APNEA (ICD-780.57) NEPHROLITHIASIS (ICD-592.0) DEPRESSION (ICD-311) ARTHRITIS (ICD-716.90) HYPERTENSION (ICD-401.9) Asthmatic Bronchitis..............................................................Marland KitchenWert    - HFA 75%  December 04, 2008 > 75% February 05, 2009 > 75 % August 04, 2009     - PFT's January 04, 2009  2.56 (86%)  ratio 75,  no resp to B2 and DLC0 67% > 80after correction Interstitial Cystitis  Past Surgical History: Last updated: 03/11/2009 hysterectomy cholecystectomy pacemaker in hip for interstitial cystitis  Family History: Last updated: 03/11/2009 Emphysema- MGF Asthma- MGM Heart dz-  Father Lung CA- MGF, Aunt No FH of Colon Cancer:  Social History: Last updated: 08/20/2009 Married 2 Children Current smoker since age 63.  Smokes 1/2 ppd. No ETOH Homemaker Daily Caffeine Use  Mt Dew occasional marajuana.   Risk Factors: Alcohol Use: 0 (07/06/2009) Caffeine Use: 6 (07/19/2009)  Risk Factors: Smoking Status: current (07/06/2009) Packs/Day: 0.5 (07/06/2009)  Family History: Reviewed history from 03/11/2009 and no changes  required. Emphysema- MGF Asthma- MGM Heart dz- Father Lung CA- MGF, Aunt No FH of Colon Cancer:  Social History: Reviewed history from 08/20/2009 and no changes required. Married 2 Children Current smoker since age 54.  Smokes 1/2 ppd. No ETOH Homemaker Daily Caffeine Use  Mt Dew occasional marajuana.   Review of Systems      See HPI  Physical Exam  General:  Well-developed,well-nourished,in no acute distress; alert,appropriate and cooperative throughout examination Lungs:  Normal respiratory effort, chest expands symmetrically. Lungs are clear to auscultation, no crackles or wheezes. Heart:  normal rate and no murmur.   Msk:  severe pain with palp L low ribs Psych:  Oriented X3 and normally interactive.     Impression & Recommendations:  Problem # 1:  RIB PAIN, LEFT SIDED (ICD-786.50) vicodin as needed brace with pillow as needed  Orders: T-Ribs Unilateral 2 Views (71100TC) T-2 View CXR (71020TC)  Complete Medication List: 1)  Doxazosin Mesylate 4 Mg Tabs (Doxazosin mesylate) .Marland Kitchen.. 1 at bedtime 2)  Amitriptyline Hcl 25 Mg Tabs (Amitriptyline hcl) .... 2 tabs by mouth at bedtime 3)  Folic Acid 1 Mg Tabs (Folic acid) .Marland Kitchen.. 1 by mouth daily...must have office visit 4)  Lexapro 10 Mg Tabs (Escitalopram oxalate) .... Take 1 tablet by mouth once a day 5)  Symbicort 160-4.5 Mcg/act Aero (Budesonide-formoterol fumarate) .... 2 puffs first thing  in am and 2 puffs again in pm about 12 hours later 6)  Sertraline Hcl 50 Mg Tabs (Sertraline hcl) .... Take 1 tab by mouth at bedtime 7)  Estring 2 Mg Ring (Estradiol) .... Every 3 months 8)  Vivelle-dot 0.1 Mg/24hr Pttw (Estradiol) .... Change patch every 3 days 9)  Omeprazole 40 Mg Cpdr (Omeprazole) .... Take 1 capsule by mouth once a day before meal 10)  Hydroxyzine Hcl 25 Mg Tabs (Hydroxyzine hcl) .... 2 tabs by mouth at bedtime 11)  Clidinium-chlordiazepoxide 2.5-5 Mg Caps (Clidinium-chlordiazepoxide) .... 2 capsules by  mouth  every morning and 1 at bedtime 12)  Diazepam 5 Mg Tabs (Diazepam) .Marland Kitchen.. 1 by mouth three times a day 13)  Seroquel Xr 300 Mg Xr24h-tab (Quetiapine fumarate) .Marland Kitchen.. 1 by mouth at bedtime 14)  Bladder Flush  .... Use weekly 15)  Oscal 500/200 D-3 500-200 Mg-unit Tabs (Calcium-vitamin d) .... Take 1 tablet by mouth two times a day 16)  Alprazolam 0.5 Mg Tabs (Alprazolam) .Marland Kitchen.. 1 tab by mouth every 8 hours as needed 17)  Promethazine Hcl 25 Mg Tabs (Promethazine hcl) .... Take 1 tablet by mouth once a day as needed 18)  Hydrocortisone 1 % Crea (Hydrocortisone) .... Use as directed 19)  Renu Saline Soln (Soft lens products) .... Use as directed 20)  Proair Hfa 108 (90 Base) Mcg/act Aers (Albuterol sulfate) .Marland Kitchen.. 1-2 puffs every 4-6 hours as needed if not breathing well 21)  Mucinex Dm 30-600 Mg Xr12h-tab (Dextromethorphan-guaifenesin) .... Take 1-2 tablets every 12 hours as needed 22)  Zyrtec Allergy 10 Mg Tabs (Cetirizine hcl) .... Take 1 tab by mouth at bedtime as needed 23)  Vicodin Es 7.5-750 Mg  Tabs (Hydrocodone-acetaminophen) .Marland Kitchen.. 1 by mouth every 6 hours as needed Prescriptions: VICODIN ES 7.5-750 MG TABS (HYDROCODONE-ACETAMINOPHEN) 1 by mouth every 6 hours as needed  #30 x 0   Entered and Authorized by:   Loreen Freud DO   Signed by:   Loreen Freud DO on 05/04/2010   Method used:   Print then Give to Patient   RxID:   [id number]    Orders Added: 1)  T-Ribs Unilateral 2 Views [71100TC] 2)  T-2 View CXR [71020TC] 3)  Est. Patient Level III [14782]

## 2010-05-26 NOTE — Letter (Signed)
Summary: [facility]  [facility]   Imported By: Maryln Gottron 05/16/2010 12:40:35  _____________________________________________________________________  External Attachment:    Type:   Image     Comment:   External Document

## 2010-06-03 ENCOUNTER — Encounter: Payer: Self-pay | Admitting: Family Medicine

## 2010-06-14 ENCOUNTER — Telehealth: Payer: Self-pay | Admitting: Family Medicine

## 2010-06-23 ENCOUNTER — Other Ambulatory Visit (INDEPENDENT_AMBULATORY_CARE_PROVIDER_SITE_OTHER): Payer: Self-pay

## 2010-06-23 ENCOUNTER — Other Ambulatory Visit: Payer: Self-pay

## 2010-06-23 ENCOUNTER — Encounter (INDEPENDENT_AMBULATORY_CARE_PROVIDER_SITE_OTHER): Payer: Self-pay | Admitting: *Deleted

## 2010-06-23 DIAGNOSIS — E039 Hypothyroidism, unspecified: Secondary | ICD-10-CM

## 2010-06-23 LAB — T3, FREE: T3, Free: 3.2 pg/mL (ref 2.3–4.2)

## 2010-06-23 LAB — TSH: TSH: 1.94 u[IU]/mL (ref 0.35–5.50)

## 2010-06-23 LAB — T4, FREE: Free T4: 0.57 ng/dL — ABNORMAL LOW (ref 0.60–1.60)

## 2010-06-24 DIAGNOSIS — R634 Abnormal weight loss: Secondary | ICD-10-CM | POA: Insufficient documentation

## 2010-06-28 ENCOUNTER — Other Ambulatory Visit: Payer: Self-pay | Admitting: *Deleted

## 2010-06-28 NOTE — Progress Notes (Signed)
Summary: Results--lmom 3/6, 3/9  Phone Note Outgoing Call   Call placed by: Almeta Monas CMA Duncan Dull),  June 14, 2010 2:14 PM Call placed to: Patient Details for Reason: Pt had abnl lab values- T4,T3 uptake and free T4 and Dr.Lowne want to know if patient was having any symptoms of decreased Thyroid. Wants to recheck Thyroid levels in April.  Summary of Call: Left message to call back.... Almeta Monas CMA Duncan Dull)  June 14, 2010 2:16 PM Left message to call back... Almeta Monas CMA Duncan Dull)  June 17, 2010 11:08 AM  I spoke with patient and Dr.Atkins advised the patient that she needs to have her labs adjusted. Patient is not sure what she is taking and will call me back with that info..... Initial call taken by: Almeta Monas CMA Duncan Dull),  June 17, 2010 3:20 PM  Follow-up for Phone Call        pt called me back and read her list of meds back to me, No thyroid meds on list---stated she had been having trouble with weight loss, nausea, fatigue and hair loss.Marland KitchenMarland [facility]Pt [facility] [place]...Marland Kitchen please advise. Follow-up by: Almeta Monas CMA Duncan Dull),  June 17, 2010 3:36 PM  Additional Follow-up for Phone Call Additional follow up Details #1::        I need lab results---they were sent to be scanned in and are not in computer yet.    Additional Follow-up by: Loreen Freud DO,  June 19, 2010 11:22 AM    Additional Follow-up for Phone Call Additional follow up Details #2::    results are on the ledge next to your computer..please advise.... Almeta Monas CMA (AAMA)  June 20, 2010 10:07 AM  TSH normal,  free T4 slightly low only------  recheck entire panal first-----244.9  TSH,  free T4,  Free T3  at her earliest convenience---- labs from Dr Renaldo Fiddler only slightly abnormal---if no change---I will refer for further eval---if labs are worse we will treat.   yrlowne  05/22/2010  1050 a.    Additional Follow-up for Phone Call Additional follow up Details #3:: Details for Additional Follow-up  Action Taken: appt scheduled for this Thursday..... Almeta Monas CMA Duncan Dull)  June 20, 2010 11:31 AM

## 2010-06-29 ENCOUNTER — Other Ambulatory Visit: Payer: Self-pay | Admitting: Gastroenterology

## 2010-07-12 ENCOUNTER — Ambulatory Visit: Payer: Self-pay | Admitting: Endocrinology

## 2010-07-12 DIAGNOSIS — Z0289 Encounter for other administrative examinations: Secondary | ICD-10-CM

## 2010-07-13 LAB — RAPID STREP SCREEN (MED CTR MEBANE ONLY): Streptococcus, Group A Screen (Direct): NEGATIVE

## 2010-08-23 NOTE — Assessment & Plan Note (Signed)
[facility]                         GASTROENTEROLOGY OFFICE NOTE   NAME:Geibel, NIZA SODERHOLM                          MRN:          [id number]  DATE:09/11/2006                            DOB:          1975/10/17    Lilla continues with left lower quadrant and diarrhea.  Her colonoscopy  was unremarkable, although she did have a small adenomatous polyp  removed and will need followup in 3 years' time.  Lab data was also  unremarkable.  She did not get her Lotronex 0.5 mg twice a day  prescription filled because of feared side effects.  I again reviewed  this in detail with her today, and assured her that she would probably  do well on a low dose of 0.5 mg twice a day and given a prescription for  a 2 week free trial.  She is to call me after that time for a progress  report and need for further evaluation.  Again the risks and warning  signs of ischemic colitis reviewed with her and she is aware of them and  the need to stop Rx. and call if they arise.     Vania Rea. Jarold Motto, MD, Caleen Essex, FAGA  Electronically Signed    DRP/MedQ  DD: 09/11/2006  DT: 09/11/2006  Job #: [id number]

## 2010-08-23 NOTE — Op Note (Signed)
[name], [name]                   ACCOUNT NO.:  [id number]   MEDICAL RECORD NO.:  [id number]          PATIENT TYPE:  AMB   LOCATION:  DAY                          FACILITY:  [facility]   PHYSICIAN:  Jamison Neighbor, M.D.  DATE OF BIRTH:  1975/10/20   DATE OF PROCEDURE:  11/16/2006  DATE OF DISCHARGE:                               OPERATIVE REPORT   PREOPERATIVE DIAGNOSIS:  Interstitial cystitis.   POSTOPERATIVE DIAGNOSIS:  Interstitial cystitis.   PROCEDURE:  Cystoscopy, urethral calibration, hydrodistention of the  bladder, Marcaine and Pyridium installation, Marcaine and Kenalog  injection.   SURGEON:  Jamison Neighbor, M.D.   ANESTHESIA:  General.   COMPLICATIONS:  None.   BRIEF HISTORY:  This 35 year old female is known to have chronic pelvic  pain felt to be associated with interstitial cystitis.  The patient has  had previous cystoscopy and hydrodistention and had an excellent  response to the procedure.  She has been treated with instillation  therapy, Elmiron, amitriptyline as well as Valium for her pelvic floor  but has not had as much improvement as we had hoped.  One of her bigger  problems, she cannot get adequate insurance coverage to help pay for her  Elmiron.  We have attempted try to get this for her through the  patient's assistance program.  The patient has requested a repeat  hydrodistention.  She cannot afford to do physical therapy at this time.  We hope that she can get some improvement from the hydrodistention, but  she certainly realizes that there is no guarantee she will have response  comparable to what she had previously.  She gave full informed consent  for the procedure.   PROCEDURE:  After successful induction of general anesthesia, the  patient was placed in the dorsal position, prepped with Betadine and  draped in the usual sterile fashion.  Careful bimanual examination  revealed an unremarkable pelvis.  She did not have a cystocele,  rectocele,  enterocele.  There were no masses on bimanual exam.  The  urethra was palpably normal with no signs of a diverticulum.  The  urethra was dilated to 30-French with female urethral sounds with no  signs of obstruction, stenosis or stricture.  The cystoscope was  inserted.  The bladder was carefully inspected.  No tumors or stones  could be seen.  The mucosa was unremarkable in its appearance.  Both  ureteral orifices were normal in configuration and location.  Clear  urine was seen to efflux from each.  Hydrodistention of the bladder was  performed.  The bladder was distended at a pressure of 100 cm of water  for 5 minutes.  When the bladder was drained, very little in the way of  granulations could be identified, but the drain out cycle was free of  blood.  The bladder capacity was a little over 800 mL which would  suggest that the patient's interstitial cystitis is actually under good  control.  We certainly hope that she will have a comparable response to  what she had previously but would assess  that this is an improvement in  her bladder appearance.  The patient did not require biopsy.  A mixture  of Marcaine and Pyridium was left in the bladder.  Marcaine and Kenalog  were injected periurethrally.  The patient tolerated the procedure well  and was taken to recovery room in good condition.  She was  sent home with doxycycline for antibiotic coverage, Pyridium Plus for  any spasms and will continue on her current medications which include  hydrocodone for her standard pain, Phenergan, Valium for pelvic floor,  Cardura for pelvic floor, amitriptyline for pain, Elmiron and her  bladder instillations.      Jamison Neighbor, M.D.  Electronically Signed     RJE/MEDQ  D:  11/16/2006  T:  11/16/2006  Job:  [id number]

## 2010-08-26 NOTE — Discharge Summary (Signed)
NAME:  [name], [name]                   ACCOUNT NO.:  [id number]   MEDICAL RECORD NO.:  [id number]          PATIENT TYPE:  OIB   LOCATION:  1612                         FACILITY:  [facility]   PHYSICIAN:  Almedia Balls. Fore, M.D.   DATE OF BIRTH:  Oct 31, 1975   DATE OF ADMISSION:  10/09/2005  DATE OF DISCHARGE:  10/10/2005                                 DISCHARGE SUMMARY   HISTORY:  The patient is a 35 year old status post hysterectomy, right  salpingo-oophorectomy with persisting left ovarian cysts and severe pelvic  pain, for laparoscopy and probable cystectomy on October 09, 2005.  She had a  hemoglobin of 12.8.   HOSPITAL COURSE:  She was taken to the operating room on the morning of October 09, 2005, at which time laparoscopic extensive enterolysis and left ovarian  cystectomy were performed.  The patient was admitted for pain control.  Diet  and ambulation were progressed over the evening of July 2 and early morning  of July 3.  On the morning of July 3, she was afebrile and experiencing no  problems except for pain, which was controlled by oral analgesics.  It was  felt that she could be discharge.   FINAL DIAGNOSES:  1.  Left ovarian cyst.  2.  Extensive adhesions.   OPERATION:  Laparoscopy with extensive enterolysis and left ovarian  cystectomy.  Pathology report unavailable at the time of dictation.   DISPOSITION:  Discharged home to return to the office in 2 weeks for follow  up.  She was fully ambulatory, on a regular diet, and in good condition at  the time of discharge.  She was given a prescription for Hydrocodone with  APAP 10/325, #30 to be taken 1 q.4-6 h. p.r.n. pain.           ______________________________  Almedia Balls. Randell Patient, M.D.     SRF/MEDQ  D:  10/10/2005  T:  10/10/2005  Job:  [id number]

## 2010-08-26 NOTE — Consult Note (Signed)
NAMESULEYMA, [name]                   ACCOUNT NO.:  [id number]   MEDICAL RECORD NO.:  [id number]          Orozco TYPE:  EMS   LOCATION:  ED                            FACILITY:  [facility]   PHYSICIAN:  [name], M.D. DATE OF BIRTH:  Nov 21, 1975   DATE OF CONSULTATION:  03/05/2006  DATE OF DISCHARGE:                                 CONSULTATION   CHIEF COMPLAINT:  Left sided pain.   HISTORY OF PRESENT ILLNESS:  This 35 year old female status post  hysterectomy and right salpingo-oophorectomy for endometriosis and  persistent multiple recurrent ovarian cysts, a Orozco of Dr. Chriss Orozco of [facility] is seen in the Emergency Room after the acute onset  of left lower quadrant pain which is associated with ultrasound  confirmed left ovarian cyst measuring 4 cm in diameter.  The Orozco  presents to the Emergency Room due to the acute nature of the discomfort  beginning yesterday.  She is seen here and evaluated in the Emergency  Room and consult arranged for discharge disposition.  The Orozco  desires to avoid surgical procedures if possible, looking for medical  solutions to problem.   REVIEW OF SYSTEMS:  Notable in the Orozco has had recurrent ovarian  cysts over the years.  Additionally she has a history of interstitial  cystitis and pain discomfort are frequent problems for her.   PAST MEDICAL HISTORY:  Interstitial cystitis, multiple ovarian cysts,  endometriosis, otherwise medical history negative.   PAST SURGICAL HISTORY:  Hysterectomy, right salpingo-oophorectomy and  bladder distention x3.   ALLERGIES:  None known.  The Orozco does have itching in response to  DILAUDID IV.   PHYSICAL EXAMINATION:  GENERAL:  Reveals a generally healthy appearing  slim Caucasian female in moderate discomfort.  Alert and oriented x3.  Tolerating pain adequately.  ABDOMEN:  Bowel sounds present.  Left lower quadrant guarding.  No  rebound tenderness.  Pain is 8 out of 10.  EXTERNAL  GENITALIA:  Normal.  Pelvic exam deferred.   Vaginal ultrasound did not suggest evidence of torsion.   PLAN:  Will treat with Vicodin as analgesic, dispense 40 tablets with  one refill to use p.r.n. Suppress ovary with continuous oral  contraceptive use until Orozco able to make final decisions with Dr.  Randell Orozco regarding ovary in the future.      [name], M.D.  Electronically Signed     JVF/MEDQ  D:  03/05/2006  T:  03/05/2006  Job:  [id number]   cc:   [name]  Fax: [phone]

## 2010-08-26 NOTE — H&P (Signed)
NAME:  [name], [name]                   ACCOUNT NO.:  [id number]   MEDICAL RECORD NO.:  [id number]           PATIENT TYPE:   LOCATION:                                 FACILITY:   PHYSICIAN:  Almedia Balls. Fore, M.D.   DATE OF BIRTH:  [birth year]   DATE OF ADMISSION:  12/14/2004  DATE OF DISCHARGE:                                HISTORY & PHYSICAL   CHIEF COMPLAINT:  Pain, abnormal bleeding, uterine enlargement.   HISTORY:  The patient is a 35 year old, gravida 2, para 2 whose last  menstrual period was November 10, 2004. She has been followed in our office  over the past 18 months for abnormal bleeding and pelvic pain. She has been  found to have endometriosis in the past and has undergone a number of  suppressive therapies to include Depo-Provera, continuous oral contraceptive  agents for menstrual suppression. Our evaluation included a laparoscopy in  October of 2005 at which time she was found to have ovarian cysts and  peritoneal lesions suggestive of endometriosis. These were treated, and the  patient was placed on Lupron Depot for suppression of her probable  endometriosis. This was carried out over 6 months. After being off Lupron  Depot for several months, she began having severe pain again even at times  other than her menses. She was given the options as to further course of  treatment, and she has felt that she wishes to proceed with definitive  therapy. We have discussed hysterectomy with her to include the procedure in  full including possible removal of ovaries and hormone replacement. She has  also had the risks discussed with her to include risks of anesthesia, injury  to bowel, bladder, blood vessels, postoperative hemorrhage, infection,  recuperation, possible hormone replacement as noted above. She fully  understands all these considerations and wishes to proceed on December 14, 2004. Pap smear was normal in May of 2006.   PAST MEDICAL HISTORY:  Childbirth x2. She had a  cholecystectomy in 2000. She  has been on Vicodin and other forms of hydrocodone and Neurontin for pelvic  pain over several years. She is allergic or sensitive to [facility] and AMBIEN.   SOCIAL HISTORY:  She is a smoker and smokes approximately 1 pack a day and  has done this for the past 6 years. She is a nondrinker and denies any  recreational drug use.   FAMILY HISTORY:  Great grandmother who died of ovarian cancer at age 68.   REVIEW OF SYMPTOMS:  HEENT:  Headaches on a monthly basis and emotional  problems for which she has been on different medications over the past six  years without any improvement. GASTROINTESTINAL:  She also states that she  has occasional diarrhea. GENITOURINARY:  As in present illness.  NEUROMUSCULAR:  As noted above.   PHYSICAL EXAMINATION:  VITAL SIGNS:  Height 5 feet 4 inches, weight 103  pounds, blood pressure 112/70, pulse 80, respirations 18.  GENERAL:  A well-developed, white female in no acute distress.  HEENT:  Within normal limits.  NECK:  Supple  without masses, adenopathy or bruit.  HEART:  Regular rate and rhythm without murmur.  LUNGS:  Clear to P and A.  BREASTS:  Examined sitting and lying without mass. Axilla negative.  ABDOMEN:  Soft without mass, nontender.  PELVIC:  External genitalia, Bartholin's, urethra and Skene's glands within  normal limits. Cervix slightly inflamed, uterus is mid position, top normal  size and very tender on palpation and/or manipulation. Adnexal exam reveals  no palpable masses but very tender bilaterally. Anterior and posterior cul-  de-sac exam is confirmation.  EXTREMITIES:  Within normal limits.  CENTRAL NERVOUS SYSTEM:  Grossly intact.  SKIN:  Without suspicious lesions but multiple tattoos on her upper arms and  across her back and right lateral calf areas.   IMPRESSION:  Pelvic pain, abnormal uterine bleeding.   DISPOSITION:  As noted above.           ______________________________  Almedia Balls. Randell Patient,  M.D.     SRF/MEDQ  D:  12/07/2004  T:  12/07/2004  Job:  [id number]

## 2010-11-02 ENCOUNTER — Emergency Department (HOSPITAL_COMMUNITY)
Admission: EM | Admit: 2010-11-02 | Discharge: 2010-11-02 | Discharge status: Against Medical Advice | Payer: PRIVATE HEALTH INSURANCE | Attending: Emergency Medicine | Admitting: Emergency Medicine

## 2010-11-02 DIAGNOSIS — Z0389 Encounter for observation for other suspected diseases and conditions ruled out: Secondary | ICD-10-CM | POA: Insufficient documentation

## 2010-11-02 LAB — URINALYSIS, ROUTINE W REFLEX MICROSCOPIC
Bilirubin Urine: NEGATIVE
Glucose, UA: NEGATIVE mg/dL
Hgb urine dipstick: NEGATIVE
Ketones, ur: NEGATIVE mg/dL
Leukocytes, UA: NEGATIVE
Nitrite: NEGATIVE
Protein, ur: NEGATIVE mg/dL
Specific Gravity, Urine: 1.009 (ref 1.005–1.030)
Urobilinogen, UA: 0.2 mg/dL (ref 0.0–1.0)
pH: 7 (ref 5.0–8.0)

## 2010-11-02 LAB — POCT PREGNANCY, URINE: Preg Test, Ur: NEGATIVE

## 2010-11-18 ENCOUNTER — Telehealth: Payer: Self-pay | Admitting: *Deleted

## 2010-11-18 NOTE — Telephone Encounter (Signed)
Per dr [name] if Pt is not seeing Psych can change to Seroquel 150 bid #60 2 refill and we can increase if needed.Left message to call office.

## 2010-11-21 NOTE — Telephone Encounter (Signed)
Left message to call office

## 2010-11-28 NOTE — Telephone Encounter (Signed)
mssg left to contact the office     KP

## 2010-11-29 ENCOUNTER — Encounter: Payer: Self-pay | Admitting: *Deleted

## 2010-11-29 NOTE — Telephone Encounter (Signed)
Left message to call office, Letter mailed after several attempts to contact Pt.

## 2010-12-30 ENCOUNTER — Other Ambulatory Visit: Payer: Self-pay | Admitting: Family Medicine

## 2011-01-03 NOTE — Telephone Encounter (Signed)
Per [employer] rep PA is handled by another agency [phone] call transfer to them. Awaiting fax to complete PA.

## 2011-01-04 NOTE — Telephone Encounter (Signed)
PA faxed back awaiting response. 

## 2011-01-13 ENCOUNTER — Encounter: Payer: Self-pay | Admitting: Family Medicine

## 2011-01-13 ENCOUNTER — Ambulatory Visit (INDEPENDENT_AMBULATORY_CARE_PROVIDER_SITE_OTHER): Payer: PRIVATE HEALTH INSURANCE | Admitting: Family Medicine

## 2011-01-13 DIAGNOSIS — R634 Abnormal weight loss: Secondary | ICD-10-CM

## 2011-01-13 DIAGNOSIS — K219 Gastro-esophageal reflux disease without esophagitis: Secondary | ICD-10-CM

## 2011-01-13 DIAGNOSIS — R110 Nausea: Secondary | ICD-10-CM

## 2011-01-13 DIAGNOSIS — R200 Anesthesia of skin: Secondary | ICD-10-CM

## 2011-01-13 DIAGNOSIS — F3289 Other specified depressive episodes: Secondary | ICD-10-CM

## 2011-01-13 DIAGNOSIS — F329 Major depressive disorder, single episode, unspecified: Secondary | ICD-10-CM

## 2011-01-13 DIAGNOSIS — R209 Unspecified disturbances of skin sensation: Secondary | ICD-10-CM

## 2011-01-13 DIAGNOSIS — F32A Depression, unspecified: Secondary | ICD-10-CM

## 2011-01-13 DIAGNOSIS — R627 Adult failure to thrive: Secondary | ICD-10-CM

## 2011-01-13 LAB — CBC
HCT: 42.4 % (ref 36.0–46.0)
Hemoglobin: 14.2 g/dL (ref 12.0–15.0)
MCHC: 33.4 g/dL (ref 30.0–36.0)
MCV: 97.7 fL (ref 78.0–100.0)
Platelets: 352 10*3/uL (ref 150–400)
RBC: 4.33 MIL/uL (ref 3.87–5.11)
RDW: 12.4 % (ref 11.5–15.5)
WBC: 11.6 10*3/uL — ABNORMAL HIGH (ref 4.0–10.5)

## 2011-01-13 LAB — COMPREHENSIVE METABOLIC PANEL
ALT: 14 U/L (ref 0–35)
AST: 21 U/L (ref 0–37)
Albumin: 4.6 g/dL (ref 3.5–5.2)
Alkaline Phosphatase: 73 U/L (ref 39–117)
BUN: 7 mg/dL (ref 6–23)
CO2: 28 mEq/L (ref 19–32)
Calcium: 9.6 mg/dL (ref 8.4–10.5)
Chloride: 104 mEq/L (ref 96–112)
Creatinine, Ser: 0.58 mg/dL (ref 0.4–1.2)
GFR calc Af Amer: >60 mL/min (ref >60)
GFR calc non Af Amer: >60 mL/min (ref >60)
Glucose, Bld: 100 mg/dL — ABNORMAL HIGH (ref 70–99)
Potassium: 2.9 mEq/L — ABNORMAL LOW (ref 3.5–5.1)
Sodium: 141 mEq/L (ref 135–145)
Total Bilirubin: 0.5 mg/dL (ref 0.3–1.2)
Total Protein: 7.8 g/dL (ref 6.0–8.3)

## 2011-01-13 LAB — POCT PREGNANCY, URINE: Preg Test, Ur: NEGATIVE

## 2011-01-13 LAB — DIFFERENTIAL
Basophils Absolute: 0 10*3/uL (ref 0.0–0.1)
Basophils Relative: 0 % (ref 0–1)
Eosinophils Absolute: 0.1 10*3/uL (ref 0.0–0.7)
Eosinophils Relative: 1 % (ref 0–5)
Lymphocytes Relative: 36 % (ref 12–46)
Lymphs Abs: 4.2 10*3/uL — ABNORMAL HIGH (ref 0.7–4.0)
Monocytes Absolute: 0.8 10*3/uL (ref 0.1–1.0)
Monocytes Relative: 7 % (ref 3–12)
Neutro Abs: 6.5 10*3/uL (ref 1.7–7.7)
Neutrophils Relative %: 56 % (ref 43–77)

## 2011-01-13 LAB — URINALYSIS, ROUTINE W REFLEX MICROSCOPIC
Bilirubin Urine: NEGATIVE
Glucose, UA: NEGATIVE mg/dL
Hgb urine dipstick: NEGATIVE
Ketones, ur: NEGATIVE mg/dL
Nitrite: NEGATIVE
Protein, ur: NEGATIVE mg/dL
Specific Gravity, Urine: 1.007 (ref 1.005–1.030)
Urobilinogen, UA: 0.2 mg/dL (ref 0.0–1.0)
pH: 6.5 (ref 5.0–8.0)

## 2011-01-13 LAB — LIPASE, BLOOD: Lipase: 35 U/L (ref 11–59)

## 2011-01-13 NOTE — Patient Instructions (Signed)
Failure to Thrive  Failure to Thrive (FTT) is a condition in a baby or child that relates to the child's failure to grow (mentally, physically or emotionally). It also relates to gains in height and weight as expected for the child's age. It usually is noticed from infancy to the age of five. When the child is far below normal height and weight gains for his/her age, he or she should be evaluated medically, physically and psychologically. However, a child may be growing at a normal rate but be short in stature due to heredity. There may be a normal delay in growth that usually catches up with their peers at puberty or afterward.   CAUSES   Medical - History of premature birth, infection, newborn illnesses, endocrine gland disorders, and/or chromosome and genetic disorders.    Physical - Child abuse and/or child neglect, inability to suck or swallow, reflux, allergies, exposure to certain medicines before birth, and possible exposure to toxic chemicals.    Psychological - Behavioral, psychological problems with the parents and/or child, adolescent or single parents.   DIAGNOSIS   Detailed information about the pregnancy and any problems that developed while your child was in the nursery.    Detailed information about your child's feeding habits.    Physical examination of the child.    Blood and urine tests.    Psychological tests to evaluate child's emotional condition.    X-rays.   TREATMENT   The earlier the evaluation and diagnosis is made, the more effective the treatment will be.    The treatment should be directed to the problem(s). This may require medical, physical or psychological treatment.   HOME CARE INSTRUCTIONS   Take your child for regular well child checkups.    Work with a nutritionist to evaluate the child's dietary needs.    Keep a log or diary of your child's eating habits.    Point out the positive things that occur with your child.    Help your child cope with setbacks and  teasing at school and with friends.    Teach your child to do things on his/her own. Reward the child with compliments after succeeding.   SEEK MEDICAL CARE IF:   Your child's weight drops.    Your child does not have a normal appetite.    Your child develops behavioral problems.    Your child becomes more hyperactive.    Your child seems to be developing social and emotional problems in school and with friends.   MAKE SURE YOU:    Understand these instructions.    Will watch your condition.    Will get help right away if you are not doing well or get worse.   Document Released: 01/24/2007 Document Re-Released: 03/09/2008  [employer] Patient Information 2011 [employer].

## 2011-01-13 NOTE — Progress Notes (Signed)
  Subjective:    Patient ID: [name], female    DOB: 07-09-1975, 35 y.o.   MRN: [id number]  HPI Pt here c/o weakness , numbness and tingling in both hands and arms and weakness in legs.  Pt has lost a lot of weight----15 more lbs in the last year and she has been to GI and urology and pulmonary and she states no one knows what is wrong with her.  + nausea , no vomiting  Review of Systems    as above Objective:   Physical Exam  Constitutional: She is oriented to person, place, and time. Vital signs are normal. She appears cachectic. She is cooperative.  Non-toxic appearance.  Cardiovascular: Normal rate and regular rhythm.   No murmur heard. Pulmonary/Chest: Effort normal and breath sounds normal.  Abdominal: Soft. Bowel sounds are normal.  Neurological: She is alert and oriented to person, place, and time. She has normal reflexes. She displays normal reflexes. No cranial nerve deficit. Coordination normal.       Legs are weaker than I would expect Pt is very thin.  Skin: Skin is warm and dry.  Psychiatric: She has a normal mood and affect. Her behavior is normal. Judgment and thought content normal.          Assessment & Plan:

## 2011-01-13 NOTE — Assessment & Plan Note (Signed)
Try 1 splint--if helpful we will give her one for the other hand  Refer to neuro if no relief---? cts

## 2011-01-13 NOTE — Assessment & Plan Note (Addendum)
Failure to thrive with nausea Refer to [facility] at pt request for another opinion Pt want to hold off on nutritionist until she see GI con't meds for stomach Check labs

## 2011-01-14 LAB — VITAMIN B12: Vitamin B-12: 321 pg/mL (ref 211–911)

## 2011-01-14 LAB — BASIC METABOLIC PANEL
BUN: 4 mg/dL — ABNORMAL LOW (ref 6–23)
CO2: 28 mEq/L (ref 19–32)
Calcium: 9.6 mg/dL (ref 8.4–10.5)
Chloride: 104 mEq/L (ref 96–112)
Creat: 0.66 mg/dL (ref 0.50–1.10)
Glucose, Bld: 70 mg/dL (ref 70–99)
Potassium: 4.6 mEq/L (ref 3.5–5.3)
Sodium: 142 mEq/L (ref 135–145)

## 2011-01-14 LAB — HEPATIC FUNCTION PANEL
ALT: 18 U/L (ref 0–35)
AST: 24 U/L (ref 0–37)
Albumin: 4.5 g/dL (ref 3.5–5.2)
Alkaline Phosphatase: 74 U/L (ref 39–117)
Bilirubin, Direct: 0.1 mg/dL (ref 0.0–0.3)
Indirect Bilirubin: 0.2 mg/dL (ref 0.0–0.9)
Total Bilirubin: 0.3 mg/dL (ref 0.3–1.2)
Total Protein: 7.3 g/dL (ref 6.0–8.3)

## 2011-01-14 LAB — CBC WITH DIFFERENTIAL/PLATELET
Basophils Absolute: 0.1 10*3/uL (ref 0.0–0.1)
Basophils Relative: 1 % (ref 0–1)
Eosinophils Absolute: 0.2 10*3/uL (ref 0.0–0.7)
Eosinophils Relative: 3 % (ref 0–5)
HCT: 37.3 % (ref 36.0–46.0)
Hemoglobin: 12.4 g/dL (ref 12.0–15.0)
Lymphocytes Relative: 53 % — ABNORMAL HIGH (ref 12–46)
Lymphs Abs: 4.5 10*3/uL — ABNORMAL HIGH (ref 0.7–4.0)
MCH: 30.5 pg (ref 26.0–34.0)
MCHC: 33.2 g/dL (ref 30.0–36.0)
MCV: 91.6 fL (ref 78.0–100.0)
Monocytes Absolute: 0.4 10*3/uL (ref 0.1–1.0)
Monocytes Relative: 5 % (ref 3–12)
Neutro Abs: 3.3 10*3/uL (ref 1.7–7.7)
Neutrophils Relative %: 39 % — ABNORMAL LOW (ref 43–77)
Platelets: 338 10*3/uL (ref 150–400)
RBC: 4.07 MIL/uL (ref 3.87–5.11)
RDW: 11.9 % (ref 11.5–15.5)
WBC: 8.5 10*3/uL (ref 4.0–10.5)

## 2011-01-14 LAB — TSH: TSH: 0.883 u[IU]/mL (ref 0.350–4.500)

## 2011-01-18 LAB — VITAMIN D 1,25 DIHYDROXY
Vitamin D 1, 25 (OH)2 Total: 30 pg/mL (ref 18–72)
Vitamin D2 1, 25 (OH)2: <8 pg/mL
Vitamin D3 1, 25 (OH)2: 30 pg/mL

## 2011-01-18 NOTE — Telephone Encounter (Signed)
In order to approve Seroquel XR Pt must have tried and failed Risperdal, Geodon or plain Seroquel. It Pt is changed to the immediate release of Seroquel no PA is required. .Please advise

## 2011-01-18 NOTE — Telephone Encounter (Signed)
Please let pt know. seroquel 100 mg tid  #90  2 refills

## 2011-01-19 NOTE — Telephone Encounter (Signed)
Left message to call office

## 2011-01-20 ENCOUNTER — Other Ambulatory Visit: Payer: Self-pay | Admitting: Family Medicine

## 2011-01-20 MED ORDER — QUETIAPINE FUMARATE 100 MG PO TABS: 100.0000 mg | ORAL_TABLET | Freq: Three times a day (TID) | ORAL | Status: DC

## 2011-01-20 NOTE — Telephone Encounter (Signed)
Please advise if ok to fill, did not see this listed on current med list. Patient last seen in office 01/13/11

## 2011-01-20 NOTE — Telephone Encounter (Signed)
Rx changed to  Seroquel 100 mg tid #90 2 refills per previous telephone call note.

## 2011-01-20 NOTE — Telephone Encounter (Signed)
I thought ins [facility] refused to fill xr--that is why we switched to seroquel.

## 2011-01-20 NOTE — Telephone Encounter (Signed)
Rx sent see other message. Left message to call office.

## 2011-01-22 DIAGNOSIS — N3090 Cystitis, unspecified without hematuria: Secondary | ICD-10-CM | POA: Insufficient documentation

## 2011-01-23 LAB — HEMOGLOBIN AND HEMATOCRIT, BLOOD
HCT: 37.5
Hemoglobin: 12.9

## 2011-03-07 ENCOUNTER — Other Ambulatory Visit (HOSPITAL_COMMUNITY): Payer: Self-pay | Admitting: Family Medicine

## 2011-03-07 ENCOUNTER — Ambulatory Visit (HOSPITAL_COMMUNITY)
Admission: RE | Admit: 2011-03-07 | Discharge: 2011-03-07 | Disposition: A | Discharge status: Home / Self Care | Payer: PRIVATE HEALTH INSURANCE | Source: Ambulatory Visit | Attending: Family Medicine | Admitting: Family Medicine

## 2011-03-07 DIAGNOSIS — M545 Low back pain, unspecified: Secondary | ICD-10-CM | POA: Insufficient documentation

## 2011-03-07 DIAGNOSIS — M25559 Pain in unspecified hip: Secondary | ICD-10-CM | POA: Insufficient documentation

## 2011-03-08 DIAGNOSIS — J45909 Unspecified asthma, uncomplicated: Secondary | ICD-10-CM | POA: Insufficient documentation

## 2011-03-08 DIAGNOSIS — G8929 Other chronic pain: Secondary | ICD-10-CM | POA: Insufficient documentation

## 2011-03-08 DIAGNOSIS — J449 Chronic obstructive pulmonary disease, unspecified: Secondary | ICD-10-CM | POA: Insufficient documentation

## 2011-03-08 DIAGNOSIS — K219 Gastro-esophageal reflux disease without esophagitis: Secondary | ICD-10-CM | POA: Insufficient documentation

## 2011-06-21 ENCOUNTER — Ambulatory Visit: Payer: PRIVATE HEALTH INSURANCE | Admitting: Family Medicine

## 2011-06-21 ENCOUNTER — Encounter: Payer: Self-pay | Admitting: Family Medicine

## 2011-06-21 ENCOUNTER — Ambulatory Visit (INDEPENDENT_AMBULATORY_CARE_PROVIDER_SITE_OTHER): Payer: BC Managed Care – PPO | Admitting: Family Medicine

## 2011-06-21 ENCOUNTER — Emergency Department (HOSPITAL_COMMUNITY)
Admission: EM | Admit: 2011-06-21 | Discharge: 2011-06-22 | Disposition: A | Discharge status: Home / Self Care | Payer: BC Managed Care – PPO | Attending: Emergency Medicine | Admitting: Emergency Medicine

## 2011-06-21 ENCOUNTER — Encounter (HOSPITAL_COMMUNITY): Payer: Self-pay | Admitting: Emergency Medicine

## 2011-06-21 VITALS — BP 99/65 | HR 111 | Temp 98.6°F | Ht 64.0 in | Wt 85.0 lb

## 2011-06-21 DIAGNOSIS — F17200 Nicotine dependence, unspecified, uncomplicated: Secondary | ICD-10-CM | POA: Insufficient documentation

## 2011-06-21 DIAGNOSIS — IMO0001 Reserved for inherently not codable concepts without codable children: Secondary | ICD-10-CM | POA: Insufficient documentation

## 2011-06-21 DIAGNOSIS — F319 Bipolar disorder, unspecified: Secondary | ICD-10-CM

## 2011-06-21 DIAGNOSIS — F329 Major depressive disorder, single episode, unspecified: Secondary | ICD-10-CM

## 2011-06-21 DIAGNOSIS — F3130 Bipolar disorder, current episode depressed, mild or moderate severity, unspecified: Secondary | ICD-10-CM | POA: Insufficient documentation

## 2011-06-21 DIAGNOSIS — I10 Essential (primary) hypertension: Secondary | ICD-10-CM | POA: Insufficient documentation

## 2011-06-21 DIAGNOSIS — F3289 Other specified depressive episodes: Secondary | ICD-10-CM

## 2011-06-21 DIAGNOSIS — R110 Nausea: Secondary | ICD-10-CM | POA: Insufficient documentation

## 2011-06-21 DIAGNOSIS — R627 Adult failure to thrive: Secondary | ICD-10-CM

## 2011-06-21 HISTORY — DX: Nausea: R11.0

## 2011-06-21 HISTORY — DX: Migraine, unspecified, not intractable, without status migrainosus: G43.909

## 2011-06-21 HISTORY — DX: Other chronic pain: G89.29

## 2011-06-21 LAB — RAPID URINE DRUG SCREEN, HOSP PERFORMED
Amphetamines: NOT DETECTED
Barbiturates: POSITIVE — AB
Benzodiazepines: POSITIVE — AB
Cocaine: NOT DETECTED
Opiates: NOT DETECTED
Tetrahydrocannabinol: NOT DETECTED

## 2011-06-21 LAB — COMPREHENSIVE METABOLIC PANEL
ALT: 11 U/L (ref 0–35)
AST: 19 U/L (ref 0–37)
Albumin: 4.2 g/dL (ref 3.5–5.2)
Alkaline Phosphatase: 70 U/L (ref 39–117)
BUN: 6 mg/dL (ref 6–23)
CO2: 22 mEq/L (ref 19–32)
Calcium: 9.6 mg/dL (ref 8.4–10.5)
Chloride: 109 mEq/L (ref 96–112)
Creatinine, Ser: 0.46 mg/dL — ABNORMAL LOW (ref 0.50–1.10)
GFR calc Af Amer: >90 mL/min (ref >90)
GFR calc non Af Amer: >90 mL/min (ref >90)
Glucose, Bld: 93 mg/dL (ref 70–99)
Potassium: 4 mEq/L (ref 3.5–5.1)
Sodium: 142 mEq/L (ref 135–145)
Total Bilirubin: 0.2 mg/dL — ABNORMAL LOW (ref 0.3–1.2)
Total Protein: 7.1 g/dL (ref 6.0–8.3)

## 2011-06-21 LAB — URINALYSIS, ROUTINE W REFLEX MICROSCOPIC
Glucose, UA: NEGATIVE mg/dL
Hgb urine dipstick: NEGATIVE
Ketones, ur: NEGATIVE mg/dL
Leukocytes, UA: NEGATIVE
Nitrite: NEGATIVE
Protein, ur: 30 mg/dL — AB
Specific Gravity, Urine: 1.025 (ref 1.005–1.030)
Urobilinogen, UA: 1 mg/dL (ref 0.0–1.0)
pH: 6.5 (ref 5.0–8.0)

## 2011-06-21 LAB — CBC
HCT: 36.3 % (ref 36.0–46.0)
Hemoglobin: 12.4 g/dL (ref 12.0–15.0)
MCH: 31.1 pg (ref 26.0–34.0)
MCHC: 34.2 g/dL (ref 30.0–36.0)
MCV: 91 fL (ref 78.0–100.0)
Platelets: 335 10*3/uL (ref 150–400)
RBC: 3.99 MIL/uL (ref 3.87–5.11)
RDW: 12.9 % (ref 11.5–15.5)
WBC: 9.6 10*3/uL (ref 4.0–10.5)

## 2011-06-21 LAB — PREGNANCY, URINE: Preg Test, Ur: NEGATIVE

## 2011-06-21 LAB — ACETAMINOPHEN LEVEL: Acetaminophen (Tylenol), Serum: <15 ug/mL (ref 10–30)

## 2011-06-21 LAB — URINE MICROSCOPIC-ADD ON

## 2011-06-21 LAB — ETHANOL: Alcohol, Ethyl (B): <11 mg/dL (ref 0–11)

## 2011-06-21 MED ORDER — IBUPROFEN 200 MG PO TABS: 400.0000 mg | ORAL_TABLET | Freq: Three times a day (TID) | ORAL | Status: DC | PRN

## 2011-06-21 MED ORDER — ZOLPIDEM TARTRATE 5 MG PO TABS: 5.0000 mg | ORAL_TABLET | Freq: Every evening | ORAL | Status: DC | PRN

## 2011-06-21 MED ORDER — ACETAMINOPHEN 500 MG PO TABS
1000.0000 mg | ORAL_TABLET | Freq: Once | ORAL | Status: AC
  Administered 2011-06-21: 1000 mg via ORAL
  Filled 2011-06-21: qty 2

## 2011-06-21 MED ORDER — LORAZEPAM 1 MG PO TABS: 1.0000 mg | ORAL_TABLET | Freq: Three times a day (TID) | ORAL | Status: DC | PRN

## 2011-06-21 MED ORDER — ALUM & MAG HYDROXIDE-SIMETH 200-200-20 MG/5ML PO SUSP: 30.0000 mL | ORAL | Status: DC | PRN

## 2011-06-21 MED ORDER — ACETAMINOPHEN 325 MG PO TABS: 650.0000 mg | ORAL_TABLET | ORAL | Status: DC | PRN

## 2011-06-21 MED ORDER — IBUPROFEN 200 MG PO TABS
400.0000 mg | ORAL_TABLET | Freq: Once | ORAL | Status: AC
  Administered 2011-06-21: 400 mg via ORAL
  Filled 2011-06-21: qty 2

## 2011-06-21 MED ORDER — NICOTINE 21 MG/24HR TD PT24: 21.0000 mg | MEDICATED_PATCH | Freq: Every day | TRANSDERMAL | Status: DC | PRN

## 2011-06-21 MED ORDER — ONDANSETRON HCL 4 MG PO TABS: 4.0000 mg | ORAL_TABLET | Freq: Three times a day (TID) | ORAL | Status: DC | PRN

## 2011-06-21 MED ORDER — ONDANSETRON 8 MG PO TBDP
8.0000 mg | ORAL_TABLET | Freq: Once | ORAL | Status: AC
  Administered 2011-06-21: 8 mg via ORAL
  Filled 2011-06-21: qty 1

## 2011-06-21 NOTE — ED Notes (Signed)
Pt wanted to know if urine was back yet and we had to tell her no but we will keep watching for it to come back and call the MD and the ACT team.

## 2011-06-21 NOTE — ED Provider Notes (Signed)
History     CSN: [id number]  Arrival date & time 06/21/11  1236   First MD Initiated Contact with Patient 06/21/11 1544      Chief Complaint  Patient presents with  . Medical Clearance  . Nausea  . Generalized Body Aches    HPI Pt was seen at 1550.   Per pt, c/o gradual onset and worsening of persistent depression for the past several weeks.  States she has not been taking her bipolar meds "in a while."  Endorses she is not sleeping nor eating well, as well as is having an acute flair of her multiple chronic medical issues (generalized body pain/fibromyalgia, migraine headache pains and chronic nausea).  Denies SA, no HI, no CP/SOB, no cough, no fevers, no abd pain, no diarrhea.   Past Medical History  Diagnosis Date  . 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 FIBROMYALGIA 08/31/2007  . 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 SLEEP APNEA 08/31/2007  . 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 DEPRESSION 08/31/2007  . ARTHRITIS 08/31/2007  . HYPERTENSION 08/31/2007  . Chronic interstitial cystitis 03/11/2009  . IBS 03/11/2009  . GERD 02/05/2009  . BRONCHITIS, RECURRENT 08/23/2009    Asthmatic Bronchitis-Dr. Sherene Sires.....-HFA 75% 12/04/2008>75% 02/05/2009>75% 08/04/2009 -PFT's 01/04/2009 2.56 (86%) ratio 75, no resp to B2 and DLC0 67% > 80 after correction   . Anal fissure 03/11/2009  . RECTAL BLEEDING 03/11/2009  . COLONIC POLYPS, HX OF 03/11/2009  . Migraine headache   . Chronic pain   . Chronic nausea     Past Surgical History  Procedure Date  . Cholecystectomy   . Abdominal hysterectomy   . Pacemaker     in hip for interstitial cystitis    Family History  Problem Relation Age of Onset  . Heart disease Father   . Asthma Maternal Grandmother   . Emphysema Maternal Grandfather   . Cancer Maternal Grandfather     Lung Cancer  . Cancer Other     Lung Cancer-Aunt    History  Substance Use Topics  . Smoking status: Current Everyday Smoker -- 1.0 packs/day for 14 years  . Smokeless tobacco:  Not on file  . Alcohol Use: No    Review of Systems ROS: Statement: All systems negative except as marked or noted in the HPI; Constitutional: Negative for fever and chills. +generalized body aches/fatigue/chronic pain ; ; Eyes: Negative for eye pain, redness and discharge. ; ; ENMT: Negative for ear pain, hoarseness, nasal congestion, sinus pressure and sore throat. ; ; Cardiovascular: Negative for chest pain, palpitations, diaphoresis, dyspnea and peripheral edema. ; ; Respiratory: Negative for cough, wheezing and stridor. ; ; Gastrointestinal: +chronic nausea. Negative for vomiting, diarrhea, abdominal pain, blood in stool, hematemesis, jaundice and rectal bleeding. . ; ; Genitourinary: Negative for dysuria, flank pain and hematuria. ; ; Musculoskeletal: Negative for back pain and neck pain. Negative for swelling and trauma.; ; Skin: Negative for pruritus, rash, abrasions, blisters, bruising and skin lesion.; ; Neuro: +migraine headache. Negative for lightheadedness and neck stiffness. Negative for weakness, altered level of consciousness , altered mental status, extremity weakness, paresthesias, involuntary movement, seizure and syncope.; Psych:  +depression, No SA, no HI, no hallucinations.     Allergies  Lunesta; Metoclopramide hcl; and Propoxyphene n-acetaminophen  Home Medications   Current Outpatient Rx  Name Route Sig Dispense Refill  . ADULT MULTIVITAMIN W/MINERALS CH Oral Take 1 tablet by mouth daily. Chewable multivitamin.    Marland Kitchen OVER THE COUNTER MEDICATION Oral Take 4-6 tablets by mouth at bedtime.  Night-time sleep aid.      BP 110/78  Pulse 95  Temp(Src) 98.1 F (36.7 C) (Oral)  Resp 18  SpO2 100%  Physical Exam 1555: Physical examination:  Nursing notes reviewed; Vital signs and O2 SAT reviewed;  Constitutional: Thin, Well hydrated, In no acute distress; Head:  Normocephalic, atraumatic; Eyes: EOMI, PERRL, No scleral icterus; ENMT: Mouth and pharynx normal, Mucous membranes  moist; Neck: Supple, Full range of motion, No lymphadenopathy; Cardiovascular: Regular rate and rhythm, No murmur, rub, or gallop; Respiratory: Breath sounds clear & equal bilaterally, No rales, rhonchi, wheezes, or rub, Normal respiratory effort/excursion; Chest: Nontender, Movement normal; Abdomen: Soft, Nontender, Nondistended, Normal bowel sounds; Extremities: Pulses normal, No tenderness, No edema, No calf edema or asymmetry.; Neuro: AA&Ox3, Major CN grossly intact.  No gross focal motor or sensory deficits in extremities.; Skin: Color normal, Warm, Dry; Psych:  Affect flat, poor eye contact, +depression.   ED Course  Procedures   MDM  MDM Reviewed: nursing note and vitals Interpretation: labs    Results for orders placed during the hospital encounter of 06/21/11  CBC      Component Value Range   WBC 9.6  4.0 - 10.5 (K/uL)   RBC 3.99  3.87 - 5.11 (MIL/uL)   Hemoglobin 12.4  12.0 - 15.0 (g/dL)   HCT 40.9  81.1 - 91.4 (%)   MCV 91.0  78.0 - 100.0 (fL)   MCH 31.1  26.0 - 34.0 (pg)   MCHC 34.2  30.0 - 36.0 (g/dL)   RDW 78.2  95.6 - 21.3 (%)   Platelets 335  150 - 400 (K/uL)  COMPREHENSIVE METABOLIC PANEL      Component Value Range   Sodium 142  135 - 145 (mEq/L)   Potassium 4.0  3.5 - 5.1 (mEq/L)   Chloride 109  96 - 112 (mEq/L)   CO2 22  19 - 32 (mEq/L)   Glucose, Bld 93  70 - 99 (mg/dL)   BUN 6  6 - 23 (mg/dL)   Creatinine, Ser 0.86 (*) 0.50 - 1.10 (mg/dL)   Calcium 9.6  8.4 - 57.8 (mg/dL)   Total Protein 7.1  6.0 - 8.3 (g/dL)   Albumin 4.2  3.5 - 5.2 (g/dL)   AST 19  0 - 37 (U/L)   ALT 11  0 - 35 (U/L)   Alkaline Phosphatase 70  39 - 117 (U/L)   Total Bilirubin 0.2 (*) 0.3 - 1.2 (mg/dL)   GFR calc non Af Amer >90  >90 (mL/min)   GFR calc Af Amer >90  >90 (mL/min)  ETHANOL      Component Value Range   Alcohol, Ethyl (B) <11  0 - 11 (mg/dL)  ACETAMINOPHEN LEVEL      Component Value Range   Acetaminophen (Tylenol), Serum <15.0  10 - 30 (ug/mL)    6:36 PM:  ACT  called and will eval, aware awaiting UA.        Laray Anger, DO 06/22/11 1444
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
## 2011-06-21 NOTE — Assessment & Plan Note (Signed)
Deteriorated.  Pt's sxs are severe.  Anorexia, insomnia, agitation.  Likely needs inpt tx.  Pt too weak to stand or sit up.  Will call EMS for transport to ER and admission to hospital.  Pt very fearful of losing her children.  Pt's daughter and mother are both very concerned and want her to get help.  Total time spent w/ pt and family, >30 min- >50% spent counseling.

## 2011-06-21 NOTE — ED Notes (Signed)
PT STATES THAT SHE IS HERE DUE TO SHE IS NOT TAKING MEDS FOR HER BI POLAR STATES THAT SHE ALSO HAS N/V NOT FEELING WELL, DENIES ANY SI BUT DOES STATE THAT SHE IS DEPRESSED, PT DOES SELF CATH HERSELF 2X A DAY TO GET URINE, UNABLE TO OBTAIN THE URINE AT THIS TIME.

## 2011-06-21 NOTE — ED Notes (Signed)
Pt. Placed in paper scrubs and security called.

## 2011-06-21 NOTE — Progress Notes (Signed)
Assist family with call to pt's husband to notify of admission

## 2011-06-21 NOTE — ED Notes (Signed)
Patient requesting sleep medication.  Reports she has not slept for two weeks.

## 2011-06-21 NOTE — Assessment & Plan Note (Signed)
New.  Likely a result of pt's ongoing mental illness.  + anorexia.  Ongoing weight loss.  Too weak to sit or stand.  EMS called for transport to ER for admission.  Pt and family in agreement.

## 2011-06-21 NOTE — ED Notes (Signed)
At 2130 called MD as pt wanted something to sleep.  The urine was not back the patient said she gave one at Triage and it was clicked off as sent.  Called the lab and they had not gotten one so pt recathed herself and we sent the urine to the lab and are now waiting on results

## 2011-06-22 MED ORDER — MIRTAZAPINE 15 MG PO TABS: 15.0000 mg | ORAL_TABLET | Freq: Every day | ORAL | Status: DC

## 2011-06-22 NOTE — Discharge Instructions (Signed)
Manic Depression (Bipolar Disorder)  Bipolar disorder is also known as manic depressive illness. It is when the brain does not function properly and causes shifts in a person's moods, energy and ability to function in everyday life. These shifts are different from the normal ups and downs that everyone experiences. Instead the shifts are severe. If this goes untreated, the person's life becomes more and more disorderly. People with this disorder can be treated can lead full and productive lives. This disorder must be managed throughout life.   SYMPTOMS    Bipolar disorder causes dramatic mood swings. These mood swings go in cycles. They cycle from extreme "highs" and irritable to deep "lows" of sadness and hopelessness.   Between the extreme moods, there are usually periods of normal mood.   Along with the mood shifts, the person will have severe changes in energy and behavior. The periods of "highs" and "lows" are called episodes of mania and depression.  Signs of mania:   Lots of energy, activity and restlessness.   Extreme "high" or good mood.   Extreme irritability.   Racing thoughts and talking very fast.   Jumping from one idea to another.   Not able to focus, easily distracted.   Little need to sleep.   Grand beliefs in one's abilities and powers.   Spending sprees.   Increased sexual drive. This can result in many sexual partners.   Poor judgment.   Abuse of drugs, particularly cocaine, alcohol, and sleeping medication.   Aggressive or provocative behavior.   A lasting period of behavior that is different from usual.   Denial that anything is wrong.  *A manic episode is identified if a "high" mood happens with three or more of the other symptoms lasting most of the day, nearly everyday for a week or longer. If the mood is more irritable in nature, four additional symptoms must be present.  Signs of depression:   Lasting feelings of sadness, anxiety, or empty mood.   Feelings of  hopelessness with negative thoughts.   Feelings of guilt, worthlessness, or helplessness.   Loss of interest or pleasure in activities once enjoyed, including sex.   Feelings of fatigue or having less energy.   Trouble focusing, making decisions, remembering.   Feeling restless or irritable.   Sleeping too little or too much.   Change in eating with possible weight gain or loss.   Feeling ongoing pain that is not caused by physical illness or injury.   Thoughts of death or suicide or suicide attempts.  *A depressive episode is identified as having five or more of the above symptoms that last most of the day, nearly everyday for two weeks or longer.  CAUSES    Research shows that there is no single cause for the disorder. Many factors act together to produce the illness.   This can be passed down from family (hereditary).   Environment may play a part.  TREATMENT    Long-term treatment is strongly recommended because bipolar disorder is a repeated illness. This disorder is better controlled if treatment is ongoing than if it is off and on.   A combination of medication and talk therapy is best for managing the disorder over time.   Medication.   Medication can be prescribed by a doctor that is an expert in treating mental disorders (psychiatrists). Medications known as "mood stabilizers" are usually prescribed to help control the illness. Other medications can be added when needed. These medicines usually treat episodes   of mania or depression that break through despite the mood stabilizer.   Talk Therapy.   Along with medication, some forms of talk therapy are helpful in providing support, education and guidance to people with the illness and their families. Studies show that this type of treatment increases mood stability, decreases need for hospitalization and improves how they function society.   Electroconvulsive Therapy (ECT).   In extreme situations where the above treatments do not work or  work too slowly to relieve severe symptoms, ECT may be considered.  Document Released: 07/03/2000 Document Revised: 03/16/2011 Document Reviewed: 02/22/2007  [employer] Patient Information 2012 [employer].

## 2011-06-22 NOTE — ED Notes (Signed)
Pt's family here and all were wanded by security and belongings locked up is cabnet for pschy ED

## 2011-06-23 LAB — URINE CULTURE
Colony Count: NO GROWTH
Culture  Setup Time: 201303140633
Culture: NO GROWTH

## 2011-07-13 ENCOUNTER — Ambulatory Visit: Payer: PRIVATE HEALTH INSURANCE | Admitting: Family Medicine

## 2011-07-13 DIAGNOSIS — Z0289 Encounter for other administrative examinations: Secondary | ICD-10-CM

## 2011-08-08 ENCOUNTER — Telehealth: Payer: Self-pay | Admitting: Family Medicine

## 2011-08-08 NOTE — Telephone Encounter (Signed)
Just an FYI Scheduled patient at 4PM 5.14.13 for a 30-minute OV The last OV for Anxiety we made for her on 4.4.13 she was a no show

## 2011-08-16 ENCOUNTER — Emergency Department (HOSPITAL_COMMUNITY): Payer: BC Managed Care – PPO

## 2011-08-16 ENCOUNTER — Emergency Department (HOSPITAL_COMMUNITY)
Admission: EM | Admit: 2011-08-16 | Discharge: 2011-08-16 | Disposition: A | Discharge status: Home / Self Care | Payer: BC Managed Care – PPO | Attending: Emergency Medicine | Admitting: Emergency Medicine

## 2011-08-16 ENCOUNTER — Encounter (HOSPITAL_COMMUNITY): Payer: Self-pay | Admitting: Emergency Medicine

## 2011-08-16 ENCOUNTER — Other Ambulatory Visit: Payer: Self-pay

## 2011-08-16 ENCOUNTER — Telehealth: Payer: Self-pay | Admitting: Family Medicine

## 2011-08-16 DIAGNOSIS — I10 Essential (primary) hypertension: Secondary | ICD-10-CM | POA: Insufficient documentation

## 2011-08-16 DIAGNOSIS — R1310 Dysphagia, unspecified: Secondary | ICD-10-CM | POA: Insufficient documentation

## 2011-08-16 DIAGNOSIS — K589 Irritable bowel syndrome without diarrhea: Secondary | ICD-10-CM | POA: Insufficient documentation

## 2011-08-16 DIAGNOSIS — K219 Gastro-esophageal reflux disease without esophagitis: Secondary | ICD-10-CM | POA: Insufficient documentation

## 2011-08-16 DIAGNOSIS — G8929 Other chronic pain: Secondary | ICD-10-CM | POA: Insufficient documentation

## 2011-08-16 DIAGNOSIS — J4489 Other specified chronic obstructive pulmonary disease: Secondary | ICD-10-CM | POA: Insufficient documentation

## 2011-08-16 DIAGNOSIS — J449 Chronic obstructive pulmonary disease, unspecified: Secondary | ICD-10-CM | POA: Insufficient documentation

## 2011-08-16 DIAGNOSIS — G4730 Sleep apnea, unspecified: Secondary | ICD-10-CM | POA: Insufficient documentation

## 2011-08-16 HISTORY — DX: Chronic obstructive pulmonary disease, unspecified: J44.9

## 2011-08-16 MED ORDER — ALBUTEROL SULFATE HFA 108 (90 BASE) MCG/ACT IN AERS
2.0000 | INHALATION_SPRAY | RESPIRATORY_TRACT | Status: DC | PRN
  Administered 2011-08-16: 2 via RESPIRATORY_TRACT
  Filled 2011-08-16: qty 6.7

## 2011-08-16 NOTE — ED Notes (Signed)
Pt c/o trouble swallowing x 2 days. staes feels like something is stuck. Has eaten and drank since but states has to keep swallowing to get it down. C/o pressure to L side of midsternum intermittant with sob on activity x 2 days also. States pain in chest is the same feeling when she has her asthma acting up. ems gave breathing tx in route due to wheezing. States wheezing gone after the tx. Pt in nad. Slight swelling to throat. No resp distress noted. Nad.

## 2011-08-16 NOTE — ED Notes (Signed)
Received report agree with previous assessment 

## 2011-08-16 NOTE — Discharge Instructions (Signed)

## 2011-08-16 NOTE — ED Provider Notes (Signed)
History     CSN: [id number]  Arrival date & time 08/16/11  1723   First MD Initiated Contact with Patient 08/16/11 1729      Chief Complaint  Patient presents with  . Shortness of Breath     Patient is a 36 y.o. female presenting with shortness of breath. The history is provided by the patient and the EMS personnel.  Shortness of Breath  The current episode started 2 days ago. The onset was gradual. The problem occurs frequently. The problem has been gradually worsening. The problem is moderate. The symptoms are relieved by beta-agonist inhalers. The symptoms are aggravated by activity. Associated symptoms include shortness of breath.  Pt reports cough/wheeze/short of breath for past 2 days, and reports some pressure in left side of her chest which she feels is c/w previous asthma exacerbations.  She reports her chest wall is sore in that region.    She also reports difficulty swallowing for past 2 days - she denies choking/drooling but reports "i have to swallow harder" No focal weakness reported, no facial droop reported and denies h/o CVA She denies any known cause of difficulty swallowing.  Does not recall any food that could have caused this  Past Medical History  Diagnosis Date  . 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 FIBROMYALGIA 08/31/2007  . 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 SLEEP APNEA 08/31/2007  . 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 DEPRESSION 08/31/2007  . ARTHRITIS 08/31/2007  . HYPERTENSION 08/31/2007  . Chronic interstitial cystitis 03/11/2009  . IBS 03/11/2009  . GERD 02/05/2009  . BRONCHITIS, RECURRENT 08/23/2009    Asthmatic Bronchitis-Dr. Sherene Sires.....-HFA 75% 12/04/2008>75% 02/05/2009>75% 08/04/2009 -PFT's 01/04/2009 2.56 (86%) ratio 75, no resp to B2 and DLC0 67% > 80 after correction   . Anal fissure 03/11/2009  . RECTAL BLEEDING 03/11/2009  . COLONIC POLYPS, HX OF 03/11/2009  . Migraine headache   . Chronic pain   . Chronic nausea   . COPD (chronic obstructive  pulmonary disease)   . Asthma     Past Surgical History  Procedure Date  . Cholecystectomy   . Abdominal hysterectomy   . Pacemaker     in hip for interstitial cystitis    Family History  Problem Relation Age of Onset  . Heart disease Father   . Asthma Maternal Grandmother   . Emphysema Maternal Grandfather   . Cancer Maternal Grandfather     Lung Cancer  . Cancer Other     Lung Cancer-Aunt    History  Substance Use Topics  . Smoking status: Current Everyday Smoker -- 1.0 packs/day for 14 years  . Smokeless tobacco: Not on file  . Alcohol Use: No    OB History    Grav Para Term Preterm Abortions TAB SAB Ect Mult Living                  Review of Systems  Respiratory: Positive for shortness of breath.   All other systems reviewed and are negative.    Allergies  Eszopiclone; Metoclopramide hcl; and Propoxyphene-acetaminophen  Home Medications   Current Outpatient Rx  Name Route Sig Dispense Refill  . ADULT MULTIVITAMIN W/MINERALS CH Oral Take 1 tablet by mouth daily. Chewable multivitamin.    Marland Kitchen OVER THE COUNTER MEDICATION Oral Take 4-6 tablets by mouth at bedtime. Night-time sleep aid.      BP 135/78  Pulse 88  Temp(Src) 98.5 F (36.9 C) (Oral)  Resp 20  SpO2 99%  Physical Exam CONSTITUTIONAL: Well developed/well nourished HEAD  AND FACE: Normocephalic/atraumatic EYES: EOMI/PERRL ENMT: Mucous membranes moist, uvula midline, pharynx normal.  Normal voice.   NECK: supple no meningeal signs no stridor.  No crepitance noted.  No swelling noted SPINE:entire spine nontender CV: S1/S2 noted, no murmurs/rubs/gallops noted LUNGS: brief scattered wheeze noted but no apparent distress Chest - tender to palpation in left upper chest ABDOMEN: soft, nontender, no rebound or guarding GU:no cva tenderness NEURO: Pt is awake/alert, moves all extremitiesx4, no facial droop, no arm/leg drift is noted EXTREMITIES: pulses normal, full ROM, no edema noted SKIN: warm,  color normal PSYCH: no abnormalities of mood noted  ED Course  Procedures  Will give albuterol mdi as she reports she no longer has those at home She is able to tolerate fluids here no choking reported Will refer to GI Stable for d/c, I doubt acute cardiopulmonary process, I doubt acute neurologic process at this time  The patient appears reasonably screened and/or stabilized for discharge and I doubt any other medical condition or other [facility] requiring further screening, evaluation, or treatment in the ED at this time prior to discharge.    MDM  Nursing notes reviewed and considered in documentation xrays reviewed and considered Previous records reviewed and considered - reviewed recent phone calls with PCP        Date: 08/16/2011  Rate: 86  Rhythm: normal sinus rhythm  QRS Axis: normal  Intervals: normal  ST/T Wave abnormalities: nonspecific ST changes  Conduction Disutrbances:none     Joya Gaskins, MD 08/16/11 1904

## 2011-08-16 NOTE — Telephone Encounter (Signed)
Caller: [name]/Patient; PCP: Lelon Perla.; CB#: [phone]; Call regarding Having Shaking Spells; Onset: 08/14/11.  Afebrile.  Feels like throat "is closing up," trouble swallowing, constant shaking inside body and externally.  Not on any meds currently; off all meds since Father died 2009/07/14.  Has appt for 2011-09-21. Decreased appetite and fluid intake d/t feels like with choke.  Wheezing present; no inhalers. Anxious and scared; alone with children.  "Feels like something is going to happen.". Shortness of breath worsens if active.  "Can hardly swallow my own spit." Advised to call 911 now for severe breathing problems per Swallowing Difficulty Guideline.

## 2011-08-17 NOTE — Telephone Encounter (Signed)
Seen in the ED 08/16/11.     KP

## 2011-08-17 NOTE — Telephone Encounter (Signed)
Please make sure she went to ER---- she can come in today

## 2011-08-22 ENCOUNTER — Encounter: Payer: Self-pay | Admitting: Family Medicine

## 2011-08-22 ENCOUNTER — Other Ambulatory Visit: Payer: Self-pay | Admitting: Sports Medicine

## 2011-08-22 ENCOUNTER — Ambulatory Visit (INDEPENDENT_AMBULATORY_CARE_PROVIDER_SITE_OTHER): Payer: BC Managed Care – PPO | Admitting: Family Medicine

## 2011-08-22 VITALS — BP 118/72 | HR 97 | Temp 97.3°F | Wt 105.2 lb

## 2011-08-22 DIAGNOSIS — F419 Anxiety disorder, unspecified: Secondary | ICD-10-CM

## 2011-08-22 DIAGNOSIS — J45909 Unspecified asthma, uncomplicated: Secondary | ICD-10-CM

## 2011-08-22 DIAGNOSIS — F411 Generalized anxiety disorder: Secondary | ICD-10-CM

## 2011-08-22 DIAGNOSIS — M545 Low back pain, unspecified: Secondary | ICD-10-CM

## 2011-08-22 DIAGNOSIS — J4520 Mild intermittent asthma, uncomplicated: Secondary | ICD-10-CM

## 2011-08-22 DIAGNOSIS — F319 Bipolar disorder, unspecified: Secondary | ICD-10-CM

## 2011-08-22 DIAGNOSIS — M25559 Pain in unspecified hip: Secondary | ICD-10-CM

## 2011-08-22 MED ORDER — ALBUTEROL SULFATE HFA 108 (90 BASE) MCG/ACT IN AERS: 2.0000 | INHALATION_SPRAY | Freq: Four times a day (QID) | RESPIRATORY_TRACT | Status: DC | PRN

## 2011-08-22 MED ORDER — BUDESONIDE-FORMOTEROL FUMARATE 160-4.5 MCG/ACT IN AERO: 2.0000 | INHALATION_SPRAY | Freq: Two times a day (BID) | RESPIRATORY_TRACT | Status: DC

## 2011-08-22 MED ORDER — ALPRAZOLAM 0.5 MG PO TABS: 0.5000 mg | ORAL_TABLET | Freq: Three times a day (TID) | ORAL | Status: DC | PRN

## 2011-08-22 MED ORDER — SERTRALINE HCL 50 MG PO TABS: 50.0000 mg | ORAL_TABLET | Freq: Every day | ORAL | Status: DC

## 2011-08-22 MED ORDER — ARIPIPRAZOLE 5 MG PO TABS: 5.0000 mg | ORAL_TABLET | Freq: Every day | ORAL | Status: DC

## 2011-08-22 NOTE — Patient Instructions (Signed)
Manic Depression (Bipolar Disorder)  Bipolar disorder is also known as manic depressive illness. It is when the brain does not function properly and causes shifts in a person's moods, energy and ability to function in everyday life. These shifts are different from the normal ups and downs that everyone experiences. Instead the shifts are severe. If this goes untreated, the person's life becomes more and more disorderly. People with this disorder can be treated can lead full and productive lives. This disorder must be managed throughout life.   SYMPTOMS    Bipolar disorder causes dramatic mood swings. These mood swings go in cycles. They cycle from extreme "highs" and irritable to deep "lows" of sadness and hopelessness.   Between the extreme moods, there are usually periods of normal mood.   Along with the mood shifts, the person will have severe changes in energy and behavior. The periods of "highs" and "lows" are called episodes of mania and depression.  Signs of mania:   Lots of energy, activity and restlessness.   Extreme "high" or good mood.   Extreme irritability.   Racing thoughts and talking very fast.   Jumping from one idea to another.   Not able to focus, easily distracted.   Little need to sleep.   Grand beliefs in one's abilities and powers.   Spending sprees.   Increased sexual drive. This can result in many sexual partners.   Poor judgment.   Abuse of drugs, particularly cocaine, alcohol, and sleeping medication.   Aggressive or provocative behavior.   A lasting period of behavior that is different from usual.   Denial that anything is wrong.  *A manic episode is identified if a "high" mood happens with three or more of the other symptoms lasting most of the day, nearly everyday for a week or longer. If the mood is more irritable in nature, four additional symptoms must be present.  Signs of depression:   Lasting feelings of sadness, anxiety, or empty mood.   Feelings of  hopelessness with negative thoughts.   Feelings of guilt, worthlessness, or helplessness.   Loss of interest or pleasure in activities once enjoyed, including sex.   Feelings of fatigue or having less energy.   Trouble focusing, making decisions, remembering.   Feeling restless or irritable.   Sleeping too little or too much.   Change in eating with possible weight gain or loss.   Feeling ongoing pain that is not caused by physical illness or injury.   Thoughts of death or suicide or suicide attempts.  *A depressive episode is identified as having five or more of the above symptoms that last most of the day, nearly everyday for two weeks or longer.  CAUSES    Research shows that there is no single cause for the disorder. Many factors act together to produce the illness.   This can be passed down from family (hereditary).   Environment may play a part.  TREATMENT    Long-term treatment is strongly recommended because bipolar disorder is a repeated illness. This disorder is better controlled if treatment is ongoing than if it is off and on.   A combination of medication and talk therapy is best for managing the disorder over time.   Medication.   Medication can be prescribed by a doctor that is an expert in treating mental disorders (psychiatrists). Medications known as "mood stabilizers" are usually prescribed to help control the illness. Other medications can be added when needed. These medicines usually treat episodes   of mania or depression that break through despite the mood stabilizer.   Talk Therapy.   Along with medication, some forms of talk therapy are helpful in providing support, education and guidance to people with the illness and their families. Studies show that this type of treatment increases mood stability, decreases need for hospitalization and improves how they function society.   Electroconvulsive Therapy (ECT).   In extreme situations where the above treatments do not work or  work too slowly to relieve severe symptoms, ECT may be considered.  Document Released: 07/03/2000 Document Revised: 03/16/2011 Document Reviewed: 02/22/2007  [employer] Patient Information 2012 [employer].

## 2011-08-22 NOTE — Progress Notes (Signed)
  Subjective:    Patient ID: [name], female    DOB: 1975/12/04, 36 y.o.   MRN: [id number]  HPI Pt here to f/u from ER.   She was dx with bipolar with anxiety and depression by psych in ER.  She needs to make an appointment with psych but needs to be started on meds.   Pt is not suicidal.  It started when her dad died and she stopped all her meds.    See ov.     Review of Systems As above    Objective:   Physical Exam  Constitutional: She is oriented to person, place, and time. She appears well-developed and well-nourished.  Neurological: She is alert and oriented to person, place, and time.  Psychiatric: Her behavior is normal. Thought content normal.       Pt is very teary eyed when talking about being dx with bipolar.   She states her family and friends don't believe in it and think she is just crazy.            Assessment & Plan:  Bipolar depression with anxiety------ restart zoloft and xanax                                                       Start abilify 5 mg qd for 1 week then inc to 10 mg                                 F/u here in 1 month or sooner prn unles you are able to get a psych appointment.

## 2011-08-22 NOTE — Assessment & Plan Note (Signed)
Refill inhalers.

## 2011-08-28 ENCOUNTER — Telehealth: Payer: Self-pay | Admitting: *Deleted

## 2011-08-28 ENCOUNTER — Ambulatory Visit
Admission: RE | Admit: 2011-08-28 | Discharge: 2011-08-28 | Disposition: A | Discharge status: Home / Self Care | Payer: BC Managed Care – PPO | Source: Ambulatory Visit | Attending: Sports Medicine | Admitting: Sports Medicine

## 2011-08-28 DIAGNOSIS — M545 Low back pain, unspecified: Secondary | ICD-10-CM

## 2011-08-28 DIAGNOSIS — M25559 Pain in unspecified hip: Secondary | ICD-10-CM

## 2011-08-28 NOTE — Telephone Encounter (Signed)
Patient stated the shaking is tolerable and she wanted to be sure it is ok that she continues to take the medication, she stated she will take the medication at night and see if this makes a difference, she also stated she did not have the shakes all day.      KP

## 2011-08-28 NOTE — Telephone Encounter (Signed)
Pt states that med is causing her hand to shake and would like to know if she can try another med.Please advise

## 2011-08-28 NOTE — Telephone Encounter (Signed)
I believe they other meds similar to abilify do the same thing.  We can give her names of psych to see to see if they can get her on different meds.   We will wean her off abilify

## 2011-09-19 ENCOUNTER — Other Ambulatory Visit: Payer: Self-pay | Admitting: Family Medicine

## 2011-09-19 DIAGNOSIS — F419 Anxiety disorder, unspecified: Secondary | ICD-10-CM

## 2011-09-19 MED ORDER — ALPRAZOLAM 0.5 MG PO TABS: 0.5000 mg | ORAL_TABLET | Freq: Three times a day (TID) | ORAL | Status: DC | PRN

## 2011-09-19 NOTE — Telephone Encounter (Signed)
Refill Alprazolam 0.5mg  tablet Qty 60 Take one tablet by mouth 3-times a day as needed Last fill 5.14.13 Last OV 5.14.13

## 2011-09-19 NOTE — Telephone Encounter (Signed)
Please advise      KP 

## 2011-09-19 NOTE — Telephone Encounter (Signed)
Faxed.   KP 

## 2011-09-19 NOTE — Telephone Encounter (Signed)
Refill x1 

## 2011-09-25 ENCOUNTER — Ambulatory Visit (INDEPENDENT_AMBULATORY_CARE_PROVIDER_SITE_OTHER): Payer: BC Managed Care – PPO | Admitting: Family Medicine

## 2011-09-25 ENCOUNTER — Encounter: Payer: Self-pay | Admitting: Family Medicine

## 2011-09-25 VITALS — BP 120/76 | HR 101 | Temp 98.5°F | Wt 107.6 lb

## 2011-09-25 DIAGNOSIS — F419 Anxiety disorder, unspecified: Secondary | ICD-10-CM

## 2011-09-25 DIAGNOSIS — F411 Generalized anxiety disorder: Secondary | ICD-10-CM

## 2011-09-25 DIAGNOSIS — F319 Bipolar disorder, unspecified: Secondary | ICD-10-CM

## 2011-09-25 MED ORDER — QUETIAPINE FUMARATE ER 200 MG PO TB24: 200.0000 mg | ORAL_TABLET | Freq: Every day | ORAL | Status: DC

## 2011-09-25 MED ORDER — ALPRAZOLAM 0.5 MG PO TABS: ORAL_TABLET | ORAL | Status: DC

## 2011-09-25 MED ORDER — SERTRALINE HCL 100 MG PO TABS: 100.0000 mg | ORAL_TABLET | Freq: Every day | ORAL | Status: DC

## 2011-09-25 NOTE — Patient Instructions (Addendum)
Manic Depression (Bipolar Disorder)  Bipolar disorder is also known as manic depressive illness. It is when the brain does not function properly and causes shifts in a person's moods, energy and ability to function in everyday life. These shifts are different from the normal ups and downs that everyone experiences. Instead the shifts are severe. If this goes untreated, the person's life becomes more and more disorderly. People with this disorder can be treated can lead full and productive lives. This disorder must be managed throughout life.   SYMPTOMS    Bipolar disorder causes dramatic mood swings. These mood swings go in cycles. They cycle from extreme "highs" and irritable to deep "lows" of sadness and hopelessness.   Between the extreme moods, there are usually periods of normal mood.   Along with the mood shifts, the person will have severe changes in energy and behavior. The periods of "highs" and "lows" are called episodes of mania and depression.  Signs of mania:   Lots of energy, activity and restlessness.   Extreme "high" or good mood.   Extreme irritability.   Racing thoughts and talking very fast.   Jumping from one idea to another.   Not able to focus, easily distracted.   Little need to sleep.   Grand beliefs in one's abilities and powers.   Spending sprees.   Increased sexual drive. This can result in many sexual partners.   Poor judgment.   Abuse of drugs, particularly cocaine, alcohol, and sleeping medication.   Aggressive or provocative behavior.   A lasting period of behavior that is different from usual.   Denial that anything is wrong.  *A manic episode is identified if a "high" mood happens with three or more of the other symptoms lasting most of the day, nearly everyday for a week or longer. If the mood is more irritable in nature, four additional symptoms must be present.  Signs of depression:   Lasting feelings of sadness, anxiety, or empty mood.   Feelings of  hopelessness with negative thoughts.   Feelings of guilt, worthlessness, or helplessness.   Loss of interest or pleasure in activities once enjoyed, including sex.   Feelings of fatigue or having less energy.   Trouble focusing, making decisions, remembering.   Feeling restless or irritable.   Sleeping too little or too much.   Change in eating with possible weight gain or loss.   Feeling ongoing pain that is not caused by physical illness or injury.   Thoughts of death or suicide or suicide attempts.  *A depressive episode is identified as having five or more of the above symptoms that last most of the day, nearly everyday for two weeks or longer.  CAUSES    Research shows that there is no single cause for the disorder. Many factors act together to produce the illness.   This can be passed down from family (hereditary).   Environment may play a part.  TREATMENT    Long-term treatment is strongly recommended because bipolar disorder is a repeated illness. This disorder is better controlled if treatment is ongoing than if it is off and on.   A combination of medication and talk therapy is best for managing the disorder over time.   Medication.   Medication can be prescribed by a doctor that is an expert in treating mental disorders (psychiatrists). Medications known as "mood stabilizers" are usually prescribed to help control the illness. Other medications can be added when needed. These medicines usually treat episodes   of mania or depression that break through despite the mood stabilizer.   Talk Therapy.   Along with medication, some forms of talk therapy are helpful in providing support, education and guidance to people with the illness and their families. Studies show that this type of treatment increases mood stability, decreases need for hospitalization and improves how they function society.   Electroconvulsive Therapy (ECT).   In extreme situations where the above treatments do not work or  work too slowly to relieve severe symptoms, ECT may be considered.  Document Released: 07/03/2000 Document Revised: 03/16/2011 Document Reviewed: 02/22/2007  [employer] Patient Information 2012 [employer].

## 2011-09-25 NOTE — Progress Notes (Signed)
  Subjective:    Patient ID: [name], female    DOB: Oct 19, 1975, 36 y.o.   MRN: [id number]  HPI Pt here c/o having reaction to abilify-- she had palpitations so she stopped it and palpitation when away.  Pt unable to go to psych this month secondary to having to pay $500 for back injections.  She will make an appointment.  Review of Systems  as above   Objective:   Physical Exam  Constitutional: She is oriented to person, place, and time. She appears well-developed and well-nourished.  Neurological: She is alert and oriented to person, place, and time.  Psychiatric: She has a normal mood and affect. Her behavior is normal. Judgment and thought content normal.          Assessment & Plan:

## 2011-09-25 NOTE — Assessment & Plan Note (Signed)
Inc zoloft to 100 mg  seroquel xr 200 mg qhs  rto 1 month unless pt gets in with psych

## 2011-10-10 ENCOUNTER — Other Ambulatory Visit: Payer: Self-pay | Admitting: Obstetrics and Gynecology

## 2011-10-27 ENCOUNTER — Ambulatory Visit: Payer: BC Managed Care – PPO | Admitting: Family Medicine

## 2011-10-27 DIAGNOSIS — Z0289 Encounter for other administrative examinations: Secondary | ICD-10-CM

## 2011-12-04 ENCOUNTER — Ambulatory Visit (INDEPENDENT_AMBULATORY_CARE_PROVIDER_SITE_OTHER): Payer: BC Managed Care – PPO | Admitting: Family Medicine

## 2011-12-04 ENCOUNTER — Encounter: Payer: Self-pay | Admitting: Family Medicine

## 2011-12-04 VITALS — BP 110/70 | HR 96 | Temp 98.4°F | Wt 112.6 lb

## 2011-12-04 DIAGNOSIS — M25559 Pain in unspecified hip: Secondary | ICD-10-CM

## 2011-12-04 DIAGNOSIS — M549 Dorsalgia, unspecified: Secondary | ICD-10-CM

## 2011-12-04 DIAGNOSIS — F419 Anxiety disorder, unspecified: Secondary | ICD-10-CM

## 2011-12-04 DIAGNOSIS — F411 Generalized anxiety disorder: Secondary | ICD-10-CM

## 2011-12-04 DIAGNOSIS — F319 Bipolar disorder, unspecified: Secondary | ICD-10-CM

## 2011-12-04 MED ORDER — SERTRALINE HCL 100 MG PO TABS: 100.0000 mg | ORAL_TABLET | Freq: Every day | ORAL | Status: DC

## 2011-12-04 MED ORDER — HYDROCODONE-ACETAMINOPHEN 5-300 MG PO TABS: ORAL_TABLET | ORAL | Status: DC

## 2011-12-04 MED ORDER — ALPRAZOLAM 0.5 MG PO TABS: ORAL_TABLET | ORAL | Status: DC

## 2011-12-04 MED ORDER — QUETIAPINE FUMARATE ER 200 MG PO TB24: 200.0000 mg | ORAL_TABLET | Freq: Every day | ORAL | Status: DC

## 2011-12-04 NOTE — Progress Notes (Signed)
  Subjective:    [name] is a 36 y.o. female who presents with right hip pain. Onset of the symptoms was several months ago. Inciting event: none. The patient reports the hip pain is worse with weight bearing, is aggravated by walking and radiates to R foot. Aggravating symptoms include: any weight bearing, going up and down stairs, rising after sitting, standing and walking. Patient has had no prior hip problems. Previous visits for this problem: yes, last seen a few months ago by ER. Evaluation to date: plain films, which were normal and an Orthopedics consult, dr Penni Bombard. Treatment to date: physical therapy, which has been not very effective.  Pt also has had CT scans and appointment was set up with Dr Charlann Boxer but she had to cancel it because of surgery.    The following portions of the patient's history were reviewed and updated as appropriate: allergies, current medications, past family history, past medical history, past social history, past surgical history and problem list.   Review of Systems Pertinent items are noted in HPI.   Objective:    BP 110/70  Pulse 96  Temp 98.4 F (36.9 C) (Oral)  Wt 112 lb 9.6 oz (51.075 kg)  SpO2 97% Right hip: positives: decreased ROM and weakness in R leg with hip flexion and plantar and dorsiflexion R foot  Left hip: normal   Imaging: X-ray the right hip: no fracture, dislocation, swelling or degenerative changes noted    Assessment:    R hip pain--etiology prob low back    Plan:    Natural history and expected course discussed. Questions answered. Orthopedics referral. pain meds per orders

## 2011-12-04 NOTE — Assessment & Plan Note (Signed)
Refill meds  Doing well

## 2011-12-04 NOTE — Patient Instructions (Addendum)

## 2011-12-13 ENCOUNTER — Telehealth: Payer: Self-pay | Admitting: Family Medicine

## 2011-12-13 DIAGNOSIS — M549 Dorsalgia, unspecified: Secondary | ICD-10-CM

## 2011-12-13 MED ORDER — HYDROCODONE-ACETAMINOPHEN 5-300 MG PO TABS: ORAL_TABLET | ORAL | Status: DC

## 2011-12-13 NOTE — Telephone Encounter (Signed)
Refill x1 

## 2011-12-13 NOTE — Telephone Encounter (Signed)
Refill: Hydrocodone/apap 5-300 tab. Take one tablet by mouth every 6 hours as needed. Qty 30. Last fill 12-04-11

## 2011-12-13 NOTE — Telephone Encounter (Signed)
Last seen and filled 12/04/11 # 30. Please advise     KP

## 2011-12-20 ENCOUNTER — Other Ambulatory Visit: Payer: Self-pay | Admitting: Family Medicine

## 2011-12-20 NOTE — Telephone Encounter (Signed)
Last seen 12/04/11 and filled 12/13/11 3 30. Please advise    KP

## 2011-12-26 ENCOUNTER — Other Ambulatory Visit: Payer: Self-pay | Admitting: Physical Medicine and Rehabilitation

## 2011-12-26 DIAGNOSIS — M545 Low back pain, unspecified: Secondary | ICD-10-CM

## 2011-12-27 ENCOUNTER — Other Ambulatory Visit: Payer: Self-pay | Admitting: Family Medicine

## 2011-12-27 NOTE — Telephone Encounter (Signed)
Last seen and filled 12/04/11 3 90. Please advise      KP

## 2011-12-28 ENCOUNTER — Ambulatory Visit
Admission: RE | Admit: 2011-12-28 | Discharge: 2011-12-28 | Disposition: A | Discharge status: Home / Self Care | Payer: BC Managed Care – PPO | Source: Ambulatory Visit | Attending: Physical Medicine and Rehabilitation | Admitting: Physical Medicine and Rehabilitation

## 2011-12-28 VITALS — BP 105/72 | HR 79

## 2011-12-28 DIAGNOSIS — M545 Low back pain, unspecified: Secondary | ICD-10-CM

## 2011-12-28 MED ORDER — ONDANSETRON HCL 4 MG/2ML IJ SOLN: 4.0000 mg | Freq: Four times a day (QID) | INTRAMUSCULAR | Status: DC | PRN

## 2011-12-28 MED ORDER — DIPHENHYDRAMINE HCL 50 MG PO CAPS
50.0000 mg | ORAL_CAPSULE | Freq: Once | ORAL | Status: AC
  Administered 2011-12-28: 50 mg via ORAL

## 2011-12-28 MED ORDER — IOHEXOL 180 MG/ML  SOLN
15.0000 mL | Freq: Once | INTRAMUSCULAR | Status: AC | PRN
  Administered 2011-12-28: 15 mL via INTRATHECAL

## 2011-12-28 MED ORDER — DIAZEPAM 5 MG PO TABS
10.0000 mg | ORAL_TABLET | Freq: Once | ORAL | Status: AC
  Administered 2011-12-28: 10 mg via ORAL

## 2011-12-28 MED ORDER — HYDROMORPHONE HCL PF 2 MG/ML IJ SOLN
2.0000 mg | Freq: Once | INTRAMUSCULAR | Status: AC
  Administered 2011-12-28: 2 mg via INTRAMUSCULAR

## 2011-12-28 MED ORDER — HYDROXYZINE HCL 50 MG/ML IM SOLN
25.0000 mg | Freq: Once | INTRAMUSCULAR | Status: AC
  Administered 2011-12-28: 25 mg via INTRAMUSCULAR

## 2011-12-28 MED ORDER — ONDANSETRON HCL 4 MG/2ML IJ SOLN
4.0000 mg | Freq: Once | INTRAMUSCULAR | Status: AC
  Administered 2011-12-28: 4 mg via INTRAMUSCULAR

## 2012-02-09 ENCOUNTER — Telehealth: Payer: Self-pay | Admitting: Family Medicine

## 2012-02-09 MED ORDER — ALPRAZOLAM 0.5 MG PO TABS: ORAL_TABLET | ORAL | Status: DC

## 2012-02-09 NOTE — Telephone Encounter (Signed)
Refill: Alprazolam 0.5mg  tablet. Take 1 tablet by mouth in the morning and at 12:00 noon as needed. May take 2 tablets at bedtime as needed. Qty 90. Last fill 01-20-12

## 2012-02-09 NOTE — Telephone Encounter (Signed)
Last seen 12/04/11 and filled 01/20/12 # 90. Please advise    KP

## 2012-02-09 NOTE — Telephone Encounter (Signed)
I made the pharmacist aware and she said the patient is only 3 days early because the way the Rx is written the patient can take up to 4 daily. Please advise    KP

## 2012-02-09 NOTE — Telephone Encounter (Signed)
Too soon

## 2012-02-09 NOTE — Telephone Encounter (Signed)
Ok to refill 

## 2012-02-09 NOTE — Telephone Encounter (Signed)
Faxed.   KP 

## 2012-03-05 ENCOUNTER — Encounter: Payer: Self-pay | Admitting: Family Medicine

## 2012-03-05 ENCOUNTER — Ambulatory Visit (INDEPENDENT_AMBULATORY_CARE_PROVIDER_SITE_OTHER): Payer: BC Managed Care – PPO | Admitting: Family Medicine

## 2012-03-05 VITALS — BP 112/70 | HR 101 | Temp 98.1°F | Wt 114.6 lb

## 2012-03-05 DIAGNOSIS — R5381 Other malaise: Secondary | ICD-10-CM

## 2012-03-05 DIAGNOSIS — R259 Unspecified abnormal involuntary movements: Secondary | ICD-10-CM

## 2012-03-05 DIAGNOSIS — R110 Nausea: Secondary | ICD-10-CM

## 2012-03-05 DIAGNOSIS — F319 Bipolar disorder, unspecified: Secondary | ICD-10-CM

## 2012-03-05 DIAGNOSIS — R5383 Other fatigue: Secondary | ICD-10-CM

## 2012-03-05 DIAGNOSIS — R251 Tremor, unspecified: Secondary | ICD-10-CM | POA: Insufficient documentation

## 2012-03-05 DIAGNOSIS — R300 Dysuria: Secondary | ICD-10-CM | POA: Insufficient documentation

## 2012-03-05 LAB — BASIC METABOLIC PANEL
BUN: 5 mg/dL — ABNORMAL LOW (ref 6–23)
CO2: 31 mEq/L (ref 19–32)
Calcium: 9.1 mg/dL (ref 8.4–10.5)
Chloride: 103 mEq/L (ref 96–112)
Creatinine, Ser: 0.6 mg/dL (ref 0.4–1.2)
GFR: 111.68 mL/min (ref >60.00)
Glucose, Bld: 98 mg/dL (ref 70–99)
Potassium: 3.4 mEq/L — ABNORMAL LOW (ref 3.5–5.1)
Sodium: 138 mEq/L (ref 135–145)

## 2012-03-05 LAB — CBC WITH DIFFERENTIAL/PLATELET
Basophils Absolute: 0 10*3/uL (ref 0.0–0.1)
Basophils Relative: 0.4 % (ref 0.0–3.0)
Eosinophils Absolute: 0.1 10*3/uL (ref 0.0–0.7)
Eosinophils Relative: 1.2 % (ref 0.0–5.0)
HCT: 36.3 % (ref 36.0–46.0)
Hemoglobin: 11.6 g/dL — ABNORMAL LOW (ref 12.0–15.0)
Lymphocytes Relative: 28.2 % (ref 12.0–46.0)
Lymphs Abs: 2.7 10*3/uL (ref 0.7–4.0)
MCHC: 31.9 g/dL (ref 30.0–36.0)
MCV: 96 fl (ref 78.0–100.0)
Monocytes Absolute: 0.5 10*3/uL (ref 0.1–1.0)
Monocytes Relative: 5 % (ref 3.0–12.0)
Neutro Abs: 6.3 10*3/uL (ref 1.4–7.7)
Neutrophils Relative %: 65.2 % (ref 43.0–77.0)
Platelets: 281 10*3/uL (ref 150.0–400.0)
RBC: 3.79 Mil/uL — ABNORMAL LOW (ref 3.87–5.11)
RDW: 12.8 % (ref 11.5–14.6)
WBC: 9.7 10*3/uL (ref 4.5–10.5)

## 2012-03-05 LAB — POCT URINALYSIS DIPSTICK
Bilirubin, UA: NEGATIVE
Blood, UA: NEGATIVE
Glucose, UA: NEGATIVE
Ketones, UA: NEGATIVE
Leukocytes, UA: NEGATIVE
Nitrite, UA: NEGATIVE
Protein, UA: NEGATIVE
Spec Grav, UA: <=1.005
Urobilinogen, UA: 0.2
pH, UA: 6

## 2012-03-05 LAB — HEPATIC FUNCTION PANEL
ALT: 13 U/L (ref 0–35)
AST: 16 U/L (ref 0–37)
Albumin: 4.1 g/dL (ref 3.5–5.2)
Alkaline Phosphatase: 91 U/L (ref 39–117)
Bilirubin, Direct: 0.1 mg/dL (ref 0.0–0.3)
Total Bilirubin: 0.5 mg/dL (ref 0.3–1.2)
Total Protein: 7.6 g/dL (ref 6.0–8.3)

## 2012-03-05 LAB — VITAMIN B12: Vitamin B-12: 249 pg/mL (ref 211–911)

## 2012-03-05 LAB — TSH: TSH: 1.07 u[IU]/mL (ref 0.35–5.50)

## 2012-03-05 MED ORDER — CLONAZEPAM 1 MG PO TABS: ORAL_TABLET | ORAL | Status: DC

## 2012-03-05 MED ORDER — ONDANSETRON 8 MG PO TBDP: 8.0000 mg | ORAL_TABLET | Freq: Three times a day (TID) | ORAL | Status: DC | PRN

## 2012-03-05 MED ORDER — QUETIAPINE FUMARATE ER 300 MG PO TB24: 300.0000 mg | ORAL_TABLET | Freq: Every day | ORAL | Status: DC

## 2012-03-05 NOTE — Assessment & Plan Note (Signed)
Refer to psych-- pt given list and numbers  Increase seroquel to 300 mg  Call in 2 weeks to let us know how it is working

## 2012-03-05 NOTE — Progress Notes (Signed)
  Subjective:    Patient ID: [name], female    DOB: [birth year], 36 y.o.   MRN: [id number]  HPI Pt here for f/u bipolar-- she is under a lot of stress for Christmas.  She doesn't know how she is going to afford Christmas for her kids and her mother is not speaking to her.    She started cutting herself again. Pt also c/o inc tremors.  The xanax is no longer working and she is having dysuria for 4 days ---she has a hx of IC but states this feels different.     Review of Systems    as above Objective:   Physical Exam  Constitutional: She appears well-developed and well-nourished.  Psychiatric: Her mood appears anxious. Her affect is inappropriate. Her affect is not angry, not blunt and not labile. Her speech is rapid and/or pressured. She is agitated. She is not aggressive, is not hyperactive and not combative. Thought content is not paranoid and not delusional. Cognition and memory are normal. She exhibits a depressed mood. She expresses no homicidal and no suicidal ideation. She expresses no suicidal plans and no homicidal plans.       Pt states she has been cutting herself again but denies suicidal ideation.  She said it helps her feel.     Filed Vitals:   03/05/12 1116  BP: 112/70  Pulse: 101  Temp: 98.1 F (36.7 C)  TempSrc: Oral  Weight: 114 lb 9.6 oz (51.982 kg)  SpO2: 98%         Assessment & Plan:

## 2012-03-05 NOTE — Assessment & Plan Note (Signed)
Check culture   

## 2012-03-05 NOTE — Assessment & Plan Note (Signed)
Change xanax to klonopin 1 mg   1/2 to 1 po bid

## 2012-03-05 NOTE — Addendum Note (Signed)
Addended by: Lelon Perla on: 03/05/2012 12:01 PM   Modules accepted: Orders

## 2012-03-05 NOTE — Patient Instructions (Addendum)
Manic Depressive Illness  Manic depressive illness (manic depression) is called a bipolar disorder because patients with this illness have both ends of the range of feelings. They may feel as though they are in a deep hole during the depression phase and feel unable to get out of what they believe is a hopeless situation. During the manic phase they feel as though they are full of energy and can accomplish anything with their boundless energy. Many lives are ruined by this disease; and without effective treatment, the illness is connected with an increased risk of suicide. Bipolar disorder is a serious brain disease that causes extreme shifts in mood, energy, and functioning. It affects about 2.3 million adult Americans. This is about 1.2 percent of the population. Men and women are equally likely to develop this illness. The disorder usually starts in adolescence or early adulthood, but can start in childhood. This illness may be passed from your parents but the gene causing this illness has not been found. Cycles, or episodes, of depression, mania, or "mixed" manic and depressive symptoms often recur (come back) and may become more frequent. This illness can disrupt work, school, family, and social life.  It is important to give your caregiver a complete picture of what has been happening. Help is often looked for during the depression phase. If treatment for depression is started, some of the antidepressant medications can actually make things worse. Antidepressants can trigger mania with a worsening of the illness. There are a number of medications which work well with this disease and your caregiver can help you find the medication or combination of medications which will work best for you.   SYMPTOMS   MANIA  Abnormally and persistently elevated (high) mood or irritability and aggressiveness, accompanied by at least three of the following symptoms:   Overly-inflated self-esteem (You think a lot of yourself  like a show-off)   Decreased need for sleep (You feel so full of energy that it seems as if you do not need sleep)   Increased talkativeness   Racing thoughts (Your ideas and thoughts may jump from one to the other in an endless stream)   Distractibility (It is difficult to keep your mind on one subject.)   Increased goal-directed activity such as shopping   Physical agitation (You may find it difficult to sit still)   Excessive involvement in risky or reckless behaviors or activities, such as spending sprees, poor business decisions, and sexual indiscretions   Poor judgment and decision making. (Your decisions are not normal or sensible)   Impulsiveness (You react quickly in an instant without thinking things through)  DEPRESSION  Symptoms include:   Loss of interest or pleasure in activities that were once enjoyed   Significant change in appetite or body weight   Difficulty sleeping, or oversleeping   Physical slowing or agitation   Loss of energy   Feelings of worthlessness or inappropriate guilt   Difficulty thinking or concentrating; poor decision making abilities   Feelings of inadequacy and low self esteem (You may feel as though you are worth nothing)   Prolonged periods of sadness without apparent cause   Unexplained crying spells   Withdrawal from usual friends and family (You may spend more time alone)   Recurrent thoughts of death and suicide  MIXED STATE  Symptoms of mania and depression are present at the same time. The symptom picture frequently includes:   Agitation   Trouble sleeping   Significant change in appetite     Psychosis   Suicidal thinking  BIPOLAR DISORDER WITH RAPID RECYCLING  Especially early in the course of illness, the episodes may be separated by periods of wellness during which a person suffers few to no symptoms. When four or more episodes of illness occur within a 12-month period, the person is said to have bipolar disorder with rapid cycling. Bipolar  disorder is often complicated by co-occurring alcohol or substance abuse.  PSYCHOSIS  Severe depression or mania may be accompanied by symptoms of psychosis. These symptoms include: hallucinations (hearing, seeing, or otherwise sensing the presence of stimuli that are not there) and delusions (false personal beliefs that are not subject to reason or contradictory evidence, and are not explained by a person's cultural concepts). Psychotic symptoms associated with bipolar typically reflect the extreme mood state at the time.  TREATMENT   Many of the above problems sound awful. The good news is that if you work with your caregivers and let them know what is wrong, they can usually help you.   A variety of medications are used to treat bipolar disorder, but even with the best medication treatment, many people with the illness have some residual (left over) symptoms. Certain types of psychotherapy or psychosocial interventions, in combination with medication, often can provide additional benefits. These include cognitive-behavioral therapy, interpersonal and social rhythm therapy, family therapy, and psychoeducation. Your caregiver can explain these therapies to you.   Lithium has long been used as a first-line treatment for bipolar disorder. It has been an effective mood-stabilizing medication for many people with bipolar disorder.   Some anticonvulsant medications have been used as alternatives to lithium in many cases. Some research suggests that different combinations of lithium and anticonvulsants may be helpful.   According to studies conducted in [place] in patients with epilepsy, valproate may increase testosterone levels in teenage girls and produce polycystic ovary syndrome in women who began taking the medication before age 20. Increased testosterone can lead to polycystic ovary syndrome with irregular or absent menses (menstrual periods), obesity (being very overweight), and abnormal growth of hair.  Therefore, young female patients taking valproate should be watched carefully by a physician.   During a depressive episode, people with bipolar disorder commonly require additional treatment with antidepressant medication. Typically, lithium or anticonvulsant mood stabilizers are prescribed along with an antidepressant to protect against a switch into mania or rapid cycling. In some cases, the newer, atypical antipsychotic drugs may help relieve severe symptoms of bipolar disorder and prevent the return of mania. More research is needed to establish the safety and efficacy of atypical antipsychotics as long-term treatments for this disorder.  Document Released: 06/23/2003 Document Revised: 06/19/2011 Document Reviewed: 03/27/2005  [employer] Patient Information 2013 [employer].

## 2012-03-08 ENCOUNTER — Encounter: Payer: Self-pay | Admitting: Family Medicine

## 2012-03-08 LAB — CULTURE, URINE COMPREHENSIVE: Colony Count: 25000

## 2012-03-12 ENCOUNTER — Ambulatory Visit (INDEPENDENT_AMBULATORY_CARE_PROVIDER_SITE_OTHER): Payer: BC Managed Care – PPO

## 2012-03-12 DIAGNOSIS — D518 Other vitamin B12 deficiency anemias: Secondary | ICD-10-CM

## 2012-03-12 MED ORDER — CYANOCOBALAMIN 1000 MCG/ML IJ SOLN: 1000.0000 ug | Freq: Once | INTRAMUSCULAR | Status: DC

## 2012-03-19 ENCOUNTER — Encounter: Payer: Self-pay | Admitting: *Deleted

## 2012-03-19 ENCOUNTER — Ambulatory Visit (INDEPENDENT_AMBULATORY_CARE_PROVIDER_SITE_OTHER): Payer: BC Managed Care – PPO

## 2012-03-19 ENCOUNTER — Other Ambulatory Visit: Payer: Self-pay | Admitting: *Deleted

## 2012-03-19 DIAGNOSIS — D518 Other vitamin B12 deficiency anemias: Secondary | ICD-10-CM

## 2012-03-19 MED ORDER — CYANOCOBALAMIN 1000 MCG/ML IJ SOLN
1000.0000 ug | Freq: Once | INTRAMUSCULAR | Status: AC
  Administered 2012-03-19: 1000 ug via INTRAMUSCULAR

## 2012-03-19 NOTE — Telephone Encounter (Signed)
Pt wanted refill on seroquel as she would run out prior to next appt due to increased dose at last OV. Dr. [name] informed and stated that pts dose was increased to 300mg  not 400mg  and advised pt to make appt w/psych for f/u. LMOVM to confirm appt has been made w/ psych.

## 2012-03-19 NOTE — Telephone Encounter (Signed)
Ok to increase to 400mg  but she needs to see psych

## 2012-03-19 NOTE — Telephone Encounter (Signed)
Patient said you told her to call if she needed to increase the medication and she has been having to take 400 mg of Seroquel.  Please advise     KP

## 2012-03-19 NOTE — Telephone Encounter (Signed)
Message copied by Arnette Norris on Tue Mar 19, 2012  2:51 PM ------      Message from: [name]      Created: Tue Mar 19, 2012 11:18 AM       Patient needs refill on Seroquel 400mg  qd.States dose was increased at last visit and will run out before next OV.             [name]

## 2012-03-21 MED ORDER — QUETIAPINE FUMARATE ER 400 MG PO TB24: 400.0000 mg | ORAL_TABLET | Freq: Every day | ORAL | Status: DC

## 2012-03-21 NOTE — Telephone Encounter (Signed)
msg left to call the office to make aware she will need to see psych    KP

## 2012-03-21 NOTE — Telephone Encounter (Signed)
Pt left VM that she was returning call. Left message to call office.

## 2012-03-22 NOTE — Telephone Encounter (Signed)
Discussed with patient and she voiced understanding and agreed to follow up with psych.     KP

## 2012-03-27 ENCOUNTER — Ambulatory Visit (INDEPENDENT_AMBULATORY_CARE_PROVIDER_SITE_OTHER): Payer: BC Managed Care – PPO

## 2012-03-27 DIAGNOSIS — E538 Deficiency of other specified B group vitamins: Secondary | ICD-10-CM

## 2012-03-27 MED ORDER — CYANOCOBALAMIN 1000 MCG/ML IJ SOLN: 1000.0000 ug | Freq: Once | INTRAMUSCULAR | Status: DC

## 2012-04-04 ENCOUNTER — Ambulatory Visit (INDEPENDENT_AMBULATORY_CARE_PROVIDER_SITE_OTHER): Payer: BC Managed Care – PPO

## 2012-04-04 DIAGNOSIS — E538 Deficiency of other specified B group vitamins: Secondary | ICD-10-CM

## 2012-04-04 MED ORDER — CYANOCOBALAMIN 1000 MCG/ML IJ SOLN: 1000.0000 ug | Freq: Once | INTRAMUSCULAR | Status: DC

## 2012-04-06 ENCOUNTER — Other Ambulatory Visit: Payer: Self-pay | Admitting: Family Medicine

## 2012-04-08 NOTE — Telephone Encounter (Signed)
Last seen and filled 03/05/12 #30. Please advise      KP

## 2012-04-12 ENCOUNTER — Other Ambulatory Visit: Payer: BC Managed Care – PPO

## 2012-04-19 ENCOUNTER — Other Ambulatory Visit (INDEPENDENT_AMBULATORY_CARE_PROVIDER_SITE_OTHER): Payer: BC Managed Care – PPO

## 2012-04-19 ENCOUNTER — Telehealth: Payer: Self-pay | Admitting: Family Medicine

## 2012-04-19 DIAGNOSIS — R5383 Other fatigue: Secondary | ICD-10-CM

## 2012-04-19 DIAGNOSIS — R5381 Other malaise: Secondary | ICD-10-CM

## 2012-04-19 LAB — CBC WITH DIFFERENTIAL/PLATELET
Basophils Absolute: 0 10*3/uL (ref 0.0–0.1)
Basophils Relative: 0.4 % (ref 0.0–3.0)
Eosinophils Absolute: 0.2 10*3/uL (ref 0.0–0.7)
Eosinophils Relative: 2.2 % (ref 0.0–5.0)
HCT: 37.5 % (ref 36.0–46.0)
Hemoglobin: 12.3 g/dL (ref 12.0–15.0)
Lymphocytes Relative: 31.6 % (ref 12.0–46.0)
Lymphs Abs: 2.5 10*3/uL (ref 0.7–4.0)
MCHC: 32.9 g/dL (ref 30.0–36.0)
MCV: 93.7 fl (ref 78.0–100.0)
Monocytes Absolute: 0.4 10*3/uL (ref 0.1–1.0)
Monocytes Relative: 5.2 % (ref 3.0–12.0)
Neutro Abs: 4.8 10*3/uL (ref 1.4–7.7)
Neutrophils Relative %: 60.6 % (ref 43.0–77.0)
Platelets: 296 10*3/uL (ref 150.0–400.0)
RBC: 4 Mil/uL (ref 3.87–5.11)
RDW: 13.4 % (ref 11.5–14.6)
WBC: 7.9 10*3/uL (ref 4.5–10.5)

## 2012-04-19 LAB — BASIC METABOLIC PANEL
BUN: 8 mg/dL (ref 6–23)
CO2: 27 mEq/L (ref 19–32)
Calcium: 9.7 mg/dL (ref 8.4–10.5)
Chloride: 105 mEq/L (ref 96–112)
Creatinine, Ser: 0.7 mg/dL (ref 0.4–1.2)
GFR: 99.01 mL/min (ref >60.00)
Glucose, Bld: 77 mg/dL (ref 70–99)
Potassium: 4 mEq/L (ref 3.5–5.1)
Sodium: 138 mEq/L (ref 135–145)

## 2012-04-19 LAB — IBC PANEL
Iron: 44 ug/dL (ref 42–145)
Saturation Ratios: 15.4 % — ABNORMAL LOW (ref 20.0–50.0)
Transferrin: 203.8 mg/dL — ABNORMAL LOW (ref 212.0–360.0)

## 2012-04-19 LAB — VITAMIN B12: Vitamin B-12: 569 pg/mL (ref 211–911)

## 2012-04-19 LAB — FERRITIN: Ferritin: 31 ng/mL (ref 10.0–291.0)

## 2012-04-19 NOTE — Telephone Encounter (Signed)
Pt would like to change the dosages on two meds she is taking. Please call at friends cell phone or home.

## 2012-04-19 NOTE — Telephone Encounter (Signed)
Msg left with a gentlemen who answered to have the patient call back.      KP

## 2012-04-22 NOTE — Telephone Encounter (Signed)
msg left to call the office     KP 

## 2012-04-24 DIAGNOSIS — R339 Retention of urine, unspecified: Secondary | ICD-10-CM | POA: Insufficient documentation

## 2012-04-26 NOTE — Telephone Encounter (Signed)
Letter mailed     KP 

## 2012-04-29 ENCOUNTER — Other Ambulatory Visit: Payer: Self-pay | Admitting: Family Medicine

## 2012-04-30 NOTE — Telephone Encounter (Signed)
Last seen and filled 03/05/12 # 60 with 1 refill. Please advise     KP

## 2012-05-01 ENCOUNTER — Other Ambulatory Visit: Payer: Self-pay | Admitting: Family Medicine

## 2012-05-01 DIAGNOSIS — F319 Bipolar disorder, unspecified: Secondary | ICD-10-CM

## 2012-05-01 MED ORDER — SERTRALINE HCL 100 MG PO TABS: 100.0000 mg | ORAL_TABLET | Freq: Every day | ORAL | Status: DC

## 2012-05-01 NOTE — Telephone Encounter (Signed)
Sertraline HCL 100mg  tablet Qty:30 Last refill: 12.26.13 Take one tablet by mouth once daily

## 2012-05-15 ENCOUNTER — Other Ambulatory Visit: Payer: Self-pay | Admitting: Family Medicine

## 2012-05-16 NOTE — Telephone Encounter (Signed)
Last seen 03/05/12 and filled 04/06/12 # 30 with 1 refill. Please advise     KP

## 2012-05-25 ENCOUNTER — Other Ambulatory Visit: Payer: Self-pay

## 2012-05-25 ENCOUNTER — Other Ambulatory Visit: Payer: Self-pay | Admitting: Family Medicine

## 2012-05-27 NOTE — Telephone Encounter (Signed)
Last seen 03/05/12 and filled 04/29/12 # 60. Please advise      KP

## 2012-06-13 ENCOUNTER — Ambulatory Visit (HOSPITAL_BASED_OUTPATIENT_CLINIC_OR_DEPARTMENT_OTHER)
Admission: RE | Admit: 2012-06-13 | Discharge: 2012-06-13 | Disposition: A | Discharge status: Home / Self Care | Payer: BC Managed Care – PPO | Source: Ambulatory Visit | Attending: Family Medicine | Admitting: Family Medicine

## 2012-06-13 ENCOUNTER — Telehealth: Payer: Self-pay | Admitting: *Deleted

## 2012-06-13 ENCOUNTER — Telehealth: Payer: Self-pay | Admitting: Gastroenterology

## 2012-06-13 ENCOUNTER — Encounter (HOSPITAL_BASED_OUTPATIENT_CLINIC_OR_DEPARTMENT_OTHER): Payer: Self-pay

## 2012-06-13 ENCOUNTER — Ambulatory Visit (INDEPENDENT_AMBULATORY_CARE_PROVIDER_SITE_OTHER): Payer: BC Managed Care – PPO | Admitting: Family Medicine

## 2012-06-13 ENCOUNTER — Encounter: Payer: Self-pay | Admitting: Family Medicine

## 2012-06-13 VITALS — BP 100/70 | HR 109 | Temp 98.1°F | Wt 123.4 lb

## 2012-06-13 DIAGNOSIS — R109 Unspecified abdominal pain: Secondary | ICD-10-CM

## 2012-06-13 DIAGNOSIS — R197 Diarrhea, unspecified: Secondary | ICD-10-CM | POA: Insufficient documentation

## 2012-06-13 DIAGNOSIS — R319 Hematuria, unspecified: Secondary | ICD-10-CM

## 2012-06-13 DIAGNOSIS — K625 Hemorrhage of anus and rectum: Secondary | ICD-10-CM

## 2012-06-13 DIAGNOSIS — F319 Bipolar disorder, unspecified: Secondary | ICD-10-CM

## 2012-06-13 HISTORY — DX: Malignant (primary) neoplasm, unspecified: C80.1

## 2012-06-13 LAB — CBC WITH DIFFERENTIAL/PLATELET
Basophils Absolute: 0 10*3/uL (ref 0.0–0.1)
Basophils Relative: 0.2 % (ref 0.0–3.0)
Eosinophils Absolute: 0.1 10*3/uL (ref 0.0–0.7)
Eosinophils Relative: 0.6 % (ref 0.0–5.0)
HCT: 34.3 % — ABNORMAL LOW (ref 36.0–46.0)
Hemoglobin: 11.3 g/dL — ABNORMAL LOW (ref 12.0–15.0)
Lymphocytes Relative: 19.3 % (ref 12.0–46.0)
Lymphs Abs: 1.9 10*3/uL (ref 0.7–4.0)
MCHC: 33 g/dL (ref 30.0–36.0)
MCV: 93.6 fl (ref 78.0–100.0)
Monocytes Absolute: 0.6 10*3/uL (ref 0.1–1.0)
Monocytes Relative: 5.9 % (ref 3.0–12.0)
Neutro Abs: 7.1 10*3/uL (ref 1.4–7.7)
Neutrophils Relative %: 74 % (ref 43.0–77.0)
Platelets: 234 10*3/uL (ref 150.0–400.0)
RBC: 3.66 Mil/uL — ABNORMAL LOW (ref 3.87–5.11)
RDW: 13.4 % (ref 11.5–14.6)
WBC: 9.7 10*3/uL (ref 4.5–10.5)

## 2012-06-13 LAB — BASIC METABOLIC PANEL
BUN: 4 mg/dL — ABNORMAL LOW (ref 6–23)
CO2: 28 mEq/L (ref 19–32)
Calcium: 9.2 mg/dL (ref 8.4–10.5)
Chloride: 106 mEq/L (ref 96–112)
Creatinine, Ser: 0.7 mg/dL (ref 0.4–1.2)
GFR: 94.31 mL/min (ref >60.00)
Glucose, Bld: 87 mg/dL (ref 70–99)
Potassium: 3 mEq/L — ABNORMAL LOW (ref 3.5–5.1)
Sodium: 141 mEq/L (ref 135–145)

## 2012-06-13 LAB — POCT URINALYSIS DIPSTICK
Bilirubin, UA: NEGATIVE
Glucose, UA: NEGATIVE
Ketones, UA: NEGATIVE
Leukocytes, UA: NEGATIVE
Nitrite, UA: NEGATIVE
Protein, UA: NEGATIVE
Spec Grav, UA: <=1.005
Urobilinogen, UA: 0.2
pH, UA: 7

## 2012-06-13 LAB — HEPATIC FUNCTION PANEL
ALT: 16 U/L (ref 0–35)
AST: 17 U/L (ref 0–37)
Albumin: 3.7 g/dL (ref 3.5–5.2)
Alkaline Phosphatase: 70 U/L (ref 39–117)
Bilirubin, Direct: 0 mg/dL (ref 0.0–0.3)
Total Bilirubin: 0.4 mg/dL (ref 0.3–1.2)
Total Protein: 7.1 g/dL (ref 6.0–8.3)

## 2012-06-13 LAB — LIPASE: Lipase: 18 U/L (ref 11.0–59.0)

## 2012-06-13 LAB — AMYLASE: Amylase: 43 U/L (ref 27–131)

## 2012-06-13 MED ORDER — OMEPRAZOLE 40 MG PO CPDR: 40.0000 mg | DELAYED_RELEASE_CAPSULE | Freq: Every day | ORAL | Status: DC

## 2012-06-13 MED ORDER — IOHEXOL 300 MG/ML  SOLN
100.0000 mL | Freq: Once | INTRAMUSCULAR | Status: AC | PRN
  Administered 2012-06-13: 100 mL via INTRAVENOUS

## 2012-06-13 MED ORDER — POTASSIUM CHLORIDE CRYS ER 20 MEQ PO TBCR: 20.0000 meq | EXTENDED_RELEASE_TABLET | Freq: Every day | ORAL | Status: DC

## 2012-06-13 NOTE — Telephone Encounter (Signed)
Pt will see Doug Sou, PA in am; she will have a CT scan this pm. Luster Landsberg will inform the pt.

## 2012-06-13 NOTE — Telephone Encounter (Signed)
Message copied by Verdene Rio on Thu Jun 13, 2012  5:20 PM ------      Message from: Lelon Perla      Created: Thu Jun 13, 2012  5:01 PM       k low--probably from diarrhea----take Kcl 20 meq daily for 1 week and recheck K      + anemia-- take mvi with iron and gi referral pending       ------

## 2012-06-13 NOTE — Assessment & Plan Note (Signed)
Pt again given list of psych to make an appointment

## 2012-06-13 NOTE — Telephone Encounter (Signed)
Called patient and left message on voice mail about weather.  Told patient that she will have to call the weather number which is [phone] for closings or delays. Also look at [employer] for closings or delays.

## 2012-06-13 NOTE — Addendum Note (Signed)
Addended by: Silvio Pate D on: 06/13/2012 04:55 PM   Modules accepted: Orders

## 2012-06-13 NOTE — Progress Notes (Signed)
  Subjective:     [name] is a 37 y.o. female who presents for evaluation of diarrhea. Onset of diarrhea was 2 months ago. Diarrhea is occurring approximately 5 times per day. Patient describes diarrhea as black and tarry and bloody. Diarrhea has been associated with abdominal pain described as cramping that makes her double over. Patient denies fever, illness in household contacts, recent antibiotic use, recent camping, recent travel, significant abdominal pain, unintentional weight loss. Previous visits for diarrhea: none. Evaluation to date: none.  Treatment to date: none.  The following portions of the patient's history were reviewed and updated as appropriate: allergies, current medications, past family history, past medical history, past social history, past surgical history and problem list.  Review of Systems Pertinent items are noted in HPI.    Objective:    BP 100/70  Pulse 109  Temp(Src) 98.1 F (36.7 C) (Oral)  Wt 123 lb 6.4 oz (55.974 kg)  BMI 21.17 kg/m2  SpO2 97% General: alert, cooperative and moderate distress  Hydration:  well hydrated  Abdomen:    abnormal findings:  distended, guarding and moderate tenderness in the entire abdomen    Assessment:    Diarrhea of uncertain etiology, moderate in severity  with heme positive stools  Plan:    Appropriate educational material discussed and distributed. Clear liquids for a few days. Follow up as needed. Lab studies per orders. Medications per orders.  Refer to GI

## 2012-06-13 NOTE — Telephone Encounter (Signed)
Discuss with patient, Rx sent. 

## 2012-06-13 NOTE — Patient Instructions (Addendum)
Rectal Bleeding Rectal bleeding is when blood passes out of the anus. It is usually a sign that something is wrong. It may not be serious, but it should always be evaluated. Rectal bleeding may present as bright red blood or extremely dark stools. The color may range from dark red or maroon to black (like tar). It is important that the cause of rectal bleeding be identified so treatment can be started and the problem corrected. CAUSES   Hemorrhoids. These are enlarged (dilated) blood vessels or veins in the anal or rectal area.  Fistulas. Theseare abnormal, burrowing channels that usually run from inside the rectum to the skin around the anus. They can bleed.  Anal fissures. This is a tear in the tissue of the anus. Bleeding occurs with bowel movements.  Diverticulosis. This is a condition in which pockets or sacs project from the bowel wall. Occasionally, the sacs can bleed.  Diverticulitis. Thisis an infection involving diverticulosis of the colon.  Proctitis and colitis. These are conditions in which the rectum, colon, or both, can become inflamed and pitted (ulcerated).  Polyps and cancer. Polyps are non-cancerous (benign) growths in the colon that may bleed. Certain types of polyps turn into cancer.  Protrusion of the rectum. Part of the rectum can project from the anus and bleed.  Certain medicines.  Intestinal infections.  Blood vessel abnormalities. HOME CARE INSTRUCTIONS  Eat a high-fiber diet to keep your stool soft.  Limit activity.  Drink enough fluids to keep your urine clear or pale yellow.  Warm baths may be useful to soothe rectal pain.  Follow up with your caregiver as directed. SEEK IMMEDIATE MEDICAL CARE IF:  You develop increased bleeding.  You have black or dark red stools.  You vomit blood or material that looks like coffee grounds.  You have abdominal pain or tenderness.  You have a fever.  You feel weak, nauseous, or you faint.  You have  severe rectal pain or you are unable to have a bowel movement. MAKE SURE YOU:  Understand these instructions.  Will watch your condition.  Will get help right away if you are not doing well or get worse. Document Released: 09/16/2001 Document Revised: 06/19/2011 Document Reviewed: 09/11/2010 [employer] Patient Information 2013 [employer].  

## 2012-06-14 ENCOUNTER — Ambulatory Visit: Payer: BC Managed Care – PPO | Admitting: Gastroenterology

## 2012-06-15 LAB — URINE CULTURE
Colony Count: NO GROWTH
Organism ID, Bacteria: NO GROWTH

## 2012-06-19 ENCOUNTER — Encounter: Payer: Self-pay | Admitting: Gastroenterology

## 2012-06-19 ENCOUNTER — Ambulatory Visit (INDEPENDENT_AMBULATORY_CARE_PROVIDER_SITE_OTHER): Payer: BC Managed Care – PPO | Admitting: Gastroenterology

## 2012-06-19 VITALS — BP 80/60 | HR 88 | Ht 64.0 in | Wt 115.0 lb

## 2012-06-19 DIAGNOSIS — R143 Flatulence: Secondary | ICD-10-CM

## 2012-06-19 DIAGNOSIS — R141 Gas pain: Secondary | ICD-10-CM

## 2012-06-19 DIAGNOSIS — R197 Diarrhea, unspecified: Secondary | ICD-10-CM

## 2012-06-19 DIAGNOSIS — R195 Other fecal abnormalities: Secondary | ICD-10-CM

## 2012-06-19 DIAGNOSIS — R140 Abdominal distension (gaseous): Secondary | ICD-10-CM | POA: Insufficient documentation

## 2012-06-19 DIAGNOSIS — R110 Nausea: Secondary | ICD-10-CM | POA: Insufficient documentation

## 2012-06-19 MED ORDER — CHOLESTYRAMINE 4 G PO PACK: 1.0000 | PACK | Freq: Two times a day (BID) | ORAL | Status: DC

## 2012-06-19 MED ORDER — ONDANSETRON 8 MG PO TBDP: 8.0000 mg | ORAL_TABLET | Freq: Three times a day (TID) | ORAL | Status: DC | PRN

## 2012-06-19 NOTE — Patient Instructions (Addendum)
Please go to the basement level to the the lab for the stool studies. You have been given a separate informational sheet regarding your tobacco use, the importance of quitting and local resources to help you quit. Gluten-Free Diet Gluten is a protein found in many grains. Gluten is present in wheat, rye, and barley. Gluten from wheat, rye, and barley protein interferes with the absorption of food in people with gluten sensitivity. It may also cause intestinal injury when eaten by individuals with gluten sensitivity.  A sample piece (biopsy) of the small intestine is usually required for a positive diagnosis of gluten sensitivity. Dietary treatment consists of eliminating foods and food ingredients from wheat, rye, and barley. When these are taken out of the diet completely, most people regain function of the small intestine. Strict compliance is important even during symptom-free periods. People with gluten sensitivity need to be on a gluten-free diet for a lifetime. During the first stages of treatment, some people will also need to restrict dairy products that contain lactose, which is a naturally occurring sugar. Lactose is difficult to absorb when the small intestines are damaged (lactose intolerance).  WHO NEEDS THIS DIET Some people who have certain diseases need to be on a gluten-free diet. These diseases include:  Celiac disease.  Nontropical sprue.  Gluten-sensitive enteropathy.  Dermatitis herpetiformis. SPECIAL NOTES  Read all labels because gluten may have been added as an incidental ingredient. Words to check for on the label include: flour, starch, durum flour, graham flour, phosphated flour, self-rising flour, semolina, farina, modified food starch, cereal, thickening, fillers, emulsifiers, any kind of malt flavoring, and hydrolyzed vegetable protein. A registered dietician can help you identify possible harmful ingredients in the foods you normally eat.  If you are not sure  whether an ingredient contains gluten, check with the manufacturer. Note that some manufacturers may change ingredients without notice. Always read labels.  Since flour and cereal products are often used in the preparation of foods, it is important to be aware of the methods of preparation used, as well as the foods themselves. This is especially true when you are dining out. Starches  Allowed: Only those prepared from arrowroot, corn, potato, rice, and bean flours. Rice wafers(*), pure cornmeal tortillas, popcorn, some crackers, and chips(*). Hot cereals made from cornmeal. Ask your dietician which specific hot and cold cereals are allowed. White or sweet potatoes, yams, hominy, rice or wild rice, and special gluten-free pasta. Some oriental rice noodles or bean noodles.  Avoid: All wheat and rye cereals, wheat germ, barley, bran, graham, malt, bulgur, and millet(-). NOTE: Avoid cereals containing malt as a flavoring, such as rice cereal. Regular noodles, spaghetti, macaroni, and most packaged rice mixes(*). All others containing wheat, rye, or barley. Vegetables  Allowed: All plain, fresh, frozen, or canned vegetables.  Avoid: Creamed vegetables(*) and vegetables canned in sauces(*). Any prepared with wheat, rye, or barley. Fruit  Allowed: All fresh, frozen, canned, or dried fruits. Fruit juices.  Avoid: Thickened or prepared fruits and some pie fillings(*). Meat and Meat Substitutes  Allowed: Meat, fish, poultry, or eggs prepared without added wheat, rye, or barley. Luncheon meat(*), frankfurters(*), and pure meat. All aged cheese and processed cheese products(*). Cottage cheese(+) and cream cheese(+). Dried beans, dried peas, and lentils.  Avoid: Any meat or meat alternate containing wheat, rye, barley, or gluten stabilizers. Bread-containing products, such as Swiss steak, croquettes, and meatloaf. Tuna canned in vegetable broth(*); [place] with HVP injected as part of the basting; any  cheese  product containing oat gum as an ingredient. Milk  Allowed: Milk. Yogurt made with allowed ingredients(*).  Avoid: Commercial chocolate milk which may have cereal added(*). Malted milk. Soups and Combination Foods  Allowed: Homemade broth and soups made with allowed ingredients; some canned or frozen soups are allowed(*). Combination or prepared foods that do not contain gluten(*). Read labels.  Avoid: All soups containing wheat, rye, or barley flour. Bouillon and bouillon cubes that contain hydrolyzed vegetable protein (HVP). Combination or prepared foods that contain gluten(*). Desserts  Allowed:  Custard, some pudding mixes(*), homemade puddings from cornstarch, rice, and tapioca. Gelatin desserts, ices, and sherbet(*). Cake, cookies, and other desserts prepared with allowed flours. Some commercial ice creams(*). Ask your dietician about specific brands of dessert that are allowed.  Avoid: Cakes, cookies, doughnuts, and pastries that are prepared with wheat, rye, or barley flour. Some commercial ice creams(*), ice cream flavors which contain cookies, crumbs, or cheesecake(*). Ice cream cones. All commercially prepared mixes for cakes, cookies, and other desserts(*). Bread pudding and other puddings thickened with flour. Sweets  Allowed: Sugar, honey, syrup(*), molasses, jelly, jam, plain hard candy, marshmallows, gumdrops, homemade candies free from wheat, rye, or barley. Coconut.  Avoid: Commercial candies containing wheat, rye, or barley(*). Certain buttercrunch toffees are dusted with wheat flour. Ask your dietician about specific brands that are not allowed. Chocolate-coated nuts, which are often rolled in flour. Fats and Oils  Allowed: Butter, margarine, vegetable oil, sour cream(+), whipping cream, shortening, lard, cream, mayonnaise(*). Some commercial salad dressings(*). Peanut butter.  Avoid: Some commercial salad dressings(*). Beverages  Allowed: Coffee (regular or  decaffeinated), tea, herbal tea (read label to be sure that no wheat flour has been added). Carbonated beverages and some root beers(*). Wine, sake, and distilled spirits, such as gin, vodka, and whiskey.  Avoid:  Certain cereal beverages. Ask your dietician about specific brands that are not allowed. Beer (unless gluten-free), ale, malted milk, and some root beers, wine, and sake. Condiments/ Miscellaneous  Allowed: Salt, pepper, herbs, spices, extracts, and food colorings. Monosodium glutamate (MSG). Cider, rice, and wine vinegar. Baking soda and baking powder. Certain soy sauces. Ask your dietician about specific brands that are allowed. Nuts, coconut, chocolate, and pure cocoa powder.  Avoid: Some curry powder(*), some dry seasoning mixes(*), some gravy extracts(*), some meat sauces(*), some catsup(*), some prepared mustard(*), horseradish(*), some soy sauce(*), chip dips(*), and some chewing gum(*). Yeast extract (contains barley). Caramel color (may contain malt). Ask your dietician about specific brands of condiments to avoid. Flour and Thickening Agents  Allowed: Arrowroot starch (A); Corn bran (B); Corn flour (B,C,D); Corn germ (B); Cornmeal (B,C,D); Corn starch (A); Potato flour (B,C,E); Potato starch flour (B,C,E); Rice bran (B); Rice flours: Plain, brown (B,C,D,E), and Sweet (A,B,C,F). Rice polish (B,C,G); Soy flour (B,C,G); Tapioca starch (A). The flour and thickening agents described above are good for: (A) Good thickening agent (B) Good when combined with other flours (C) Best combined with milk and eggs in baked products (D) Best in grainy-textured products (E) Produces drier product than other flours (F) Produces moister product than other flours (G) Adds distinct flavor to product. Use in moderation. (*) Check labels and investigate any questionable ingredients.  (-) Additional research is needed before this product can be recommended. (+) Check vegetable gum used. SAMPLE MEAL  PLAN Breakfast   Orange juice.  Banana.  Rice or corn cereal.  Toast (gluten-free bread).  Heart-healthy tub margarine.  Jam.  Milk.  Coffee or tea. Lunch  Chicken salad  sandwich (with gluten-free bread and mayonnaise).  Sliced tomatoes.  Heart-healthy tub margarine.  Apple.  Milk.  Coffee or tea. Dinner  [place].  Baked potato.  Broccoli.  Lettuce salad with gluten-free dressing.  Gluten-free bread.  Custard.  Heart-healthy margarine.  Coffee or tea. These meal plans are provided as samples. Your daily meal plans will vary. Document Released: 03/27/2005 Document Revised: 09/26/2011 Document Reviewed: 05/07/2011 [facility] Patient Information 2013 [place], [place].

## 2012-06-19 NOTE — Progress Notes (Signed)
06/19/2012 [name] [id number] 1975-08-29   History of Present Illness: This is a 37 year old female who is a patient of Dr. Norval Gable.  She has several medical issues including fibromyalgia and interstitial cystitis for which she has a pacemaker inserted, chronic abdominal pain, chronic diarrhea, and multiple gynecologic procedures.  She comes in today with several GI complaints.  First, she complains of nausea; she does take zofran quite regularly, which does help.  Also complains of constant bloating and generalized abdominal soreness.  Has complaints of diarrhea for quite a while, but says that it has been really bad over the last 3 months or so.  Has nocturnal incontinence.  Says that stools were very dark black for a while; now have become lighter.  Says that PCP checked her stool for blood and it was positive.  Says that she does see some BRB on the TP at times as well.  Does not eat much because she is afraid to have diarrhea.  Has 3-4 BM's a day even without eating much.  Has to move her bowels within 30 minutes of eating.  Recent CBC, CMET, amylase, and lipase were unremarkable except for a low potassium at 3.0; her PCP now has her taking K+ supplements.  Had a CT scan of the abdomen and pelvis on 06/13/2012, which was normal.  She has been tested for celiac disease in the past on a couple of occasions, which was negative.  Her last colonoscopy was in 04/2009 at which time it was normal; was recommended that she have a repeat in 5 years from that time due to previous adenomas removed during other colonoscopies.    She has been on lotronex in the past, however, that was very transient because her insurance would not pay for it.  She in on omeprazole 40 mg daily and feels like her acid reflux has been well controlled.  Was taking ibuprofen for a while, but has stopped that recently.  Is on iron supplements for the past year or so.  Current Medications, Allergies, Past Medical History, Past  Surgical History, Family History and Social History were reviewed in [employer] record.   Physical Exam: BP 80/60  Pulse 88  Ht 5\' 4"  (1.626 m)  Wt 115 lb (52.164 kg)  BMI 19.73 kg/m2 General: Well developed, white female in no acute distress; multiple tattoos and piercings Head: Normocephalic and atraumatic Eyes:  sclerae anicteric, conjunctiva pink  Ears: Normal auditory acuity Lungs: Some expiratory wheezing heard in left lung Heart: Regular rate and rhythm Abdomen: Soft, seems slightly distended. No masses, no hepatomegaly. Normal bowel sounds.  She expresses diffuse TTP. Musculoskeletal: Symmetrical with no gross deformities  Extremities: No edema  Neurological: Alert oriented x 4, grossly nonfocal Psychological:  Alert and cooperative. Normal mood and affect  Assessment and Recommendations: -Diarrhea and bloating with abdominal pain:  Possibly choleretic diarrhea with some component of IBS as well.  Celiac negative.  ? Gluten sensitivity.  Worsening over last few months. -Heme positive stool with reported dark stools -Nausea  *Will schedule EGD for further evaluation to rule out PUD, etc.   *Will check stools for Cdiff, culture, O&P, and WBC's with acutely worsening diarrhea and nocturnal incontinence. *She is agreeable to try to follow a gluten-free diet, so that was given to her as well. *Will begin questran BID. *Continue zofran prn.  Will refill at this time.

## 2012-06-21 ENCOUNTER — Other Ambulatory Visit: Payer: Self-pay | Admitting: Family Medicine

## 2012-06-21 NOTE — Telephone Encounter (Signed)
Last seen 06/13/12 and filled 05/25/12 #60. Please advise     KP

## 2012-06-24 ENCOUNTER — Ambulatory Visit (AMBULATORY_SURGERY_CENTER): Payer: BC Managed Care – PPO | Admitting: Gastroenterology

## 2012-06-24 ENCOUNTER — Encounter: Payer: Self-pay | Admitting: Gastroenterology

## 2012-06-24 VITALS — BP 119/74 | HR 89 | Temp 98.1°F | Resp 19 | Ht 64.0 in | Wt 115.0 lb

## 2012-06-24 DIAGNOSIS — R140 Abdominal distension (gaseous): Secondary | ICD-10-CM

## 2012-06-24 DIAGNOSIS — K219 Gastro-esophageal reflux disease without esophagitis: Secondary | ICD-10-CM

## 2012-06-24 DIAGNOSIS — D1330 Benign neoplasm of unspecified part of small intestine: Secondary | ICD-10-CM

## 2012-06-24 DIAGNOSIS — R195 Other fecal abnormalities: Secondary | ICD-10-CM

## 2012-06-24 DIAGNOSIS — K589 Irritable bowel syndrome without diarrhea: Secondary | ICD-10-CM

## 2012-06-24 DIAGNOSIS — R634 Abnormal weight loss: Secondary | ICD-10-CM

## 2012-06-24 DIAGNOSIS — R142 Eructation: Secondary | ICD-10-CM

## 2012-06-24 DIAGNOSIS — R627 Adult failure to thrive: Secondary | ICD-10-CM

## 2012-06-24 DIAGNOSIS — R1012 Left upper quadrant pain: Secondary | ICD-10-CM

## 2012-06-24 DIAGNOSIS — R110 Nausea: Secondary | ICD-10-CM

## 2012-06-24 DIAGNOSIS — R197 Diarrhea, unspecified: Secondary | ICD-10-CM

## 2012-06-24 DIAGNOSIS — R141 Gas pain: Secondary | ICD-10-CM

## 2012-06-24 MED ORDER — SODIUM CHLORIDE 0.9 % IV SOLN: 500.0000 mL | INTRAVENOUS | Status: DC

## 2012-06-24 NOTE — Patient Instructions (Addendum)
TAKE YOUR STOOL SPECIMEN TO LAB ASAP AS ORDERED WITH LAST OFFICE VISIT.  YOU HAD AN ENDOSCOPIC PROCEDURE TODAY AT THE [facility]: Refer to the procedure report that was given to you for any specific questions about what was found during the examination.  If the procedure report does not answer your questions, please call your gastroenterologist to clarify.  If you requested that your care partner not be given the details of your procedure findings, then the procedure report has been included in a sealed envelope for you to review at your convenience later.  YOU SHOULD EXPECT: Some feelings of bloating in the abdomen. Passage of more gas than usual.  Walking can help get rid of the air that was put into your GI tract during the procedure and reduce the bloating. If you had a lower endoscopy (such as a colonoscopy or flexible sigmoidoscopy) you may notice spotting of blood in your stool or on the toilet paper. If you underwent a bowel prep for your procedure, then you may not have a normal bowel movement for a few days.  DIET: Your first meal following the procedure should be a light meal and then it is ok to progress to your normal diet.  A half-sandwich or bowl of soup is an example of a good first meal.  Heavy or fried foods are harder to digest and may make you feel nauseous or bloated.  Likewise meals heavy in dairy and vegetables can cause extra gas to form and this can also increase the bloating.  Drink plenty of fluids but you should avoid alcoholic beverages for 24 hours.  ACTIVITY: Your care partner should take you home directly after the procedure.  You should plan to take it easy, moving slowly for the rest of the day.  You can resume normal activity the day after the procedure however you should NOT DRIVE or use heavy machinery for 24 hours (because of the sedation medicines used during the test).    SYMPTOMS TO REPORT IMMEDIATELY: A gastroenterologist can be reached at any hour.   During normal business hours, 8:30 AM to 5:00 PM Monday through Friday, call [phone].  After hours and on weekends, please call the GI answering service at [phone] who will take a message and have the physician on call contact you.   Following lower endoscopy (colonoscopy or flexible sigmoidoscopy):  Excessive amounts of blood in the stool  Significant tenderness or worsening of abdominal pains  Swelling of the abdomen that is new, acute  Fever of 100F or higher FOLLOW UP: If any biopsies were taken you will be contacted by phone or by letter within the next 1-3 weeks.  Call your gastroenterologist if you have not heard about the biopsies in 3 weeks.  Our staff will call the home number listed on your records the next business day following your procedure to check on you and address any questions or concerns that you may have at that time regarding the information given to you following your procedure. This is a courtesy call and so if there is no answer at the home number and we have not heard from you through the emergency physician on call, we will assume that you have returned to your regular daily activities without incident.  SIGNATURES/CONFIDENTIALITY: You and/or your care partner have signed paperwork which will be entered into your electronic medical record.  These signatures attest to the fact that that the information above on your After Visit Summary has  been reviewed and is understood.  Full responsibility of the confidentiality of this discharge information lies with you and/or your care-partner.

## 2012-06-24 NOTE — Progress Notes (Signed)
Patient did not experience any of the following events: a burn prior to discharge; a fall within the facility; wrong site/side/patient/procedure/implant event; or a hospital transfer or hospital admission upon discharge from the facility. ([id number]) Patient did not have preoperative order for IV antibiotic SSI prophylaxis. ([id number])  

## 2012-06-24 NOTE — Progress Notes (Signed)
Called to room to assist during endoscopic procedure.  Patient ID and intended procedure confirmed with present staff. Received instructions for my participation in the procedure from the performing physician.  

## 2012-06-24 NOTE — Progress Notes (Signed)
Need to remove upper denture plate. Maw

## 2012-06-24 NOTE — Op Note (Signed)
[facility] [address] [place], [address]   ENDOSCOPY PROCEDURE REPORT  PATIENT: [name], [name]  MR#: [id number] BIRTHDATE: November 18, 1975 , 37  yrs. old GENDER: Female ENDOSCOPIST:David Hale Bogus, MD, [facility] REFERRED BY: PROCEDURE DATE:  06/24/2012 PROCEDURE:   EGD w/ biopsy and EGD w/ biopsy for H.pylori ASA CLASS:    Class II INDICATIONS: Chronic diarrhea, Unexplained diarrhea, and abdominal pain. MEDICATION: propofol (Diprivan) 200mg  IV TOPICAL ANESTHETIC:  DESCRIPTION OF PROCEDURE:   After the risks and benefits of the procedure were explained, informed consent was obtained.  [facility] GIF-H180 [id number]  endoscope was introduced through the mouth  and advanced to the second portion of the duodenum .  The instrument was slowly withdrawn as the mucosa was fully examined.    The upper, middle and distal third of the esophagus were carefully inspected and no abnormalities were noted.  The z-line was well seen at the GEJ.  The endoscope was pushed into the fundus which was normal including a retroflexed view.  The antrum, gastric body, first and second part of the duodenum were unremarkable.  Multiple random biopsies of the duodenum were performed.CLO Bx. also done. Retroflexed views revealed no abnormalities.    The scope was then withdrawn from the patient and the procedure completed.  COMPLICATIONS: There were no complications.   ENDOSCOPIC IMPRESSION: Normal EGD; multiple random biopsies...IBS diarrhea...  RECOMMENDATIONS: 1.  Await pathology results 2.  Continue current meds    _______________________________ eSigned:  Mardella Layman, MD, [facility] 06/24/2012 10:40 AM  cc>Jessiva Zahn,PA standard discharge

## 2012-06-24 NOTE — Progress Notes (Signed)
PATIENT COMPLAINED OF CRAMPING OF STOMACH, INFORMED DR. PATTERSON. PATIENT INSTRUCTED TO DRINK WARM FLUIDS. COFFEE GIVEN TO PATIENT. PATIENT TO BATHROOM TO URINATE PRIOR TO DISCHARGE. PATIENT;S CRAMPING MUCH IMPROVED AND WANTING TO GO HOME.

## 2012-06-25 ENCOUNTER — Telehealth: Payer: Self-pay | Admitting: *Deleted

## 2012-06-25 ENCOUNTER — Other Ambulatory Visit: Payer: BC Managed Care – PPO

## 2012-06-25 DIAGNOSIS — R195 Other fecal abnormalities: Secondary | ICD-10-CM

## 2012-06-25 DIAGNOSIS — R110 Nausea: Secondary | ICD-10-CM

## 2012-06-25 DIAGNOSIS — R197 Diarrhea, unspecified: Secondary | ICD-10-CM

## 2012-06-25 DIAGNOSIS — R140 Abdominal distension (gaseous): Secondary | ICD-10-CM

## 2012-06-25 LAB — HELICOBACTER PYLORI SCREEN-BIOPSY: UREASE: NEGATIVE

## 2012-06-25 NOTE — Telephone Encounter (Signed)
Left message

## 2012-06-26 ENCOUNTER — Encounter: Payer: Self-pay | Admitting: Gastroenterology

## 2012-06-26 LAB — FECAL LACTOFERRIN, QUANT: Lactoferrin: NEGATIVE

## 2012-06-27 LAB — CLOSTRIDIUM DIFFICILE BY PCR: Toxigenic C. Difficile by PCR: NOT DETECTED

## 2012-06-27 LAB — OVA AND PARASITE SCREEN: OP: NONE SEEN

## 2012-06-28 ENCOUNTER — Other Ambulatory Visit: Payer: Self-pay | Admitting: Family Medicine

## 2012-06-29 LAB — STOOL CULTURE

## 2012-07-06 ENCOUNTER — Other Ambulatory Visit: Payer: Self-pay | Admitting: Family Medicine

## 2012-07-31 ENCOUNTER — Telehealth: Payer: Self-pay | Admitting: Family Medicine

## 2012-07-31 DIAGNOSIS — E876 Hypokalemia: Secondary | ICD-10-CM

## 2012-07-31 NOTE — Telephone Encounter (Signed)
Order put in for potassium recheck. cholestyramine (QUESTRAN) 4 G packet was not prescribed by Dr.Lowne.  I have tried to contact the patient but her lines is unavailable.    KP

## 2012-07-31 NOTE — Telephone Encounter (Signed)
Patient calling states she was to come back and have her potassium checked, I do not see order.  Also, patient states Dr. [name] gave her Cholestyramine to drink, but when she does it makes her so sick she vomits.  Please advise.

## 2012-08-01 NOTE — Telephone Encounter (Signed)
I tried contacting the patient-- No answer.      KP

## 2012-08-02 NOTE — Telephone Encounter (Signed)
Discussed with patient and she will call to schedule the potassium and will follow up with GI for the medication.       KP

## 2012-08-06 ENCOUNTER — Ambulatory Visit: Payer: BC Managed Care – PPO | Admitting: Gastroenterology

## 2012-08-13 ENCOUNTER — Other Ambulatory Visit: Payer: Self-pay | Admitting: Family Medicine

## 2012-08-13 NOTE — Telephone Encounter (Signed)
Last seen 06/13/12 and filled 06/21/12 #60. Please advise     KP

## 2012-08-26 ENCOUNTER — Ambulatory Visit (INDEPENDENT_AMBULATORY_CARE_PROVIDER_SITE_OTHER): Payer: BC Managed Care – PPO | Admitting: Family Medicine

## 2012-08-26 ENCOUNTER — Encounter: Payer: Self-pay | Admitting: Family Medicine

## 2012-08-26 VITALS — BP 90/80 | HR 96 | Temp 98.1°F | Wt 114.0 lb

## 2012-08-26 DIAGNOSIS — J449 Chronic obstructive pulmonary disease, unspecified: Secondary | ICD-10-CM

## 2012-08-26 DIAGNOSIS — J438 Other emphysema: Secondary | ICD-10-CM

## 2012-08-26 DIAGNOSIS — R350 Frequency of micturition: Secondary | ICD-10-CM

## 2012-08-26 DIAGNOSIS — R300 Dysuria: Secondary | ICD-10-CM

## 2012-08-26 DIAGNOSIS — G4700 Insomnia, unspecified: Secondary | ICD-10-CM

## 2012-08-26 LAB — POCT URINALYSIS DIPSTICK
Bilirubin, UA: NEGATIVE
Blood, UA: NEGATIVE
Glucose, UA: NEGATIVE
Ketones, UA: NEGATIVE
Leukocytes, UA: NEGATIVE
Nitrite, UA: NEGATIVE
Protein, UA: NEGATIVE
Spec Grav, UA: <=1.005
Urobilinogen, UA: 0.2
pH, UA: 5

## 2012-08-26 MED ORDER — TRAZODONE HCL 50 MG PO TABS: 25.0000 mg | ORAL_TABLET | Freq: Every evening | ORAL | Status: DC | PRN

## 2012-08-26 MED ORDER — FLUTICASONE FUROATE-VILANTEROL 100-25 MCG/INH IN AEPB: 1.0000 | INHALATION_SPRAY | Freq: Every day | RESPIRATORY_TRACT | Status: DC

## 2012-08-26 NOTE — Progress Notes (Signed)
  Subjective:    Patient ID: [name], female    DOB: 04-02-1976, 37 y.o.   MRN: [id number]  HPI Pt here c/o urinary burning and low abd pain.  No fever, no hematuria.  Pt also c/o insomnia-- she does have a appoint with psych the end of the month.  She is also c/o of wheezing and thinks she need somehting other than symbicort.     Review of Systems    as above Objective:   Physical Exam  BP 90/80  Pulse 96  Temp(Src) 98.1 F (36.7 C) (Oral)  Wt 114 lb (51.71 kg)  BMI 19.56 kg/m2  SpO2 96% Neck: no adenopathy, supple, symmetrical, trachea midline and thyroid not enlarged, symmetric, no tenderness/mass/nodules Lungs: wheezes bilaterally Heart: S1, S2 normal Abdomen: soft, non-tender; bowel sounds normal; no masses,  no organomegaly Extremities: extremities normal, atraumatic, no cyanosis or edema  UA normal     Assessment & Plan:

## 2012-08-26 NOTE — Assessment & Plan Note (Signed)
Check culture F/u urology

## 2012-08-26 NOTE — Assessment & Plan Note (Signed)
trazadone  F/u psych

## 2012-08-26 NOTE — Patient Instructions (Addendum)

## 2012-08-26 NOTE — Assessment & Plan Note (Signed)
See PFT Refer to pulm Quit smoking breo 1 inh qd

## 2012-08-29 ENCOUNTER — Ambulatory Visit (INDEPENDENT_AMBULATORY_CARE_PROVIDER_SITE_OTHER)
Admission: RE | Admit: 2012-08-29 | Discharge: 2012-08-29 | Disposition: A | Discharge status: Home / Self Care | Payer: BC Managed Care – PPO | Source: Ambulatory Visit | Attending: Internal Medicine | Admitting: Internal Medicine

## 2012-08-29 ENCOUNTER — Encounter: Payer: Self-pay | Admitting: Internal Medicine

## 2012-08-29 ENCOUNTER — Ambulatory Visit (INDEPENDENT_AMBULATORY_CARE_PROVIDER_SITE_OTHER): Payer: BC Managed Care – PPO | Admitting: Internal Medicine

## 2012-08-29 VITALS — BP 90/60 | HR 95 | Temp 98.2°F | Ht 64.0 in | Wt 112.2 lb

## 2012-08-29 DIAGNOSIS — J45909 Unspecified asthma, uncomplicated: Secondary | ICD-10-CM

## 2012-08-29 DIAGNOSIS — R05 Cough: Secondary | ICD-10-CM

## 2012-08-29 DIAGNOSIS — J4520 Mild intermittent asthma, uncomplicated: Secondary | ICD-10-CM

## 2012-08-29 DIAGNOSIS — F17200 Nicotine dependence, unspecified, uncomplicated: Secondary | ICD-10-CM

## 2012-08-29 DIAGNOSIS — R059 Cough, unspecified: Secondary | ICD-10-CM

## 2012-08-29 MED ORDER — VARENICLINE TARTRATE 0.5 MG X 11 & 1 MG X 42 PO MISC: ORAL | Status: DC

## 2012-08-29 MED ORDER — BUDESONIDE-FORMOTEROL FUMARATE 160-4.5 MCG/ACT IN AERO: INHALATION_SPRAY | RESPIRATORY_TRACT | Status: DC

## 2012-08-29 NOTE — Progress Notes (Signed)
Subjective:     Patient ID: [name], female   DOB: 10/26/1975  MRN: [id number]  HPI 74 yowf active smoker no previous chronic resp problems until dx with AB 2006 and since then daily sob with tendency to overuse of saba    December 04, 2008 ov c/w 6 weeks indolent onset progressively worse sensation of wheezing and sob and chest tightness just on left side not much cough, some nausea assoc with hoarsness , worst first thing in am and when lies down. rec stop smoking and takeSymbicort 160/4.5 2 puffs first thing in am and 2 puffs again in pm about 12  r  January 04, 2009 ov cut down on smoking and liked symbicort much less need for saba     May 06, 2009--Presents for a 3 month follow up - states breathing was better when she was taking the dexilant rather than the nexium, symbicort has been working well. has prod cough with clear mucus, chest congestion, increased dyspnea, wheezing  . Wants to change back to dexilant.  . rec change to dexilant, short cycle of pred > better    08/29/12 ov/[name] to re-consult per Dr Lowne/actively smoking no ppi, maintained on breo but with freq use of saba at baseline Chief Complaint  Patient presents with  . Follow-up    Pt c/o wheezing and cough x 1 month. Cough is occ prod with minimal to moderate sputum-? color. She states feels heaviness in chest.    symptoms indolent onset and progessive x one month with sob x > slow adls and night/day cough and wheeze and chest tightness better p saba for only a few hours  No obvious daytime variabilty or assoc   overt sinus or hb symptoms. No unusual exp hx or h/o childhood pna/ asthma or premature birth to her knowledge.    . Also denies any obvious fluctuation of symptoms with weather or environmental changes or other aggravating or alleviating factors except as outlined above   Current Medications, Allergies, Past Medical History, Past Surgical History, Family History, and Social History were reviewed in  [employer] record.  ROS  The following are not active complaints unless bolded sore throat, dysphagia, dental problems, itching, sneezing,  nasal congestion or excess/ purulent secretions, ear ache,   fever, chills, sweats, unintended wt loss, pleuritic or exertional cp, hemoptysis,  orthopnea pnd or leg swelling, presyncope, palpitations, heartburn, abdominal pain, anorexia, nausea, vomiting, diarrhea  or change in bowel or urinary habits, change in stools or urine, dysuria,hematuria,  rash, arthralgias, visual complaints, headache, numbness weakness or ataxia or problems with walking or coordination,  change in mood/affect or memory.        Review of Systems     Objective:   Physical Exam    middle aged wf nad Wt Readings from Last 3 Encounters:  08/29/12 112 lb 3.2 oz (50.894 kg)  08/26/12 114 lb (51.71 kg)  06/24/12 115 lb (52.164 kg)    HEENT: nl dentition, turbinates, and orophanx. Nl external ear canals without cough reflex   NECK :  without JVD/Nodes/TM/ nl carotid upstrokes bilaterally   LUNGS: no acc muscle use, trace exp wheeze bilaterally    CV:  RRR  no s3 or murmur or increase in P2, no edema   ABD:  soft and nontender with nl excursion in the supine position. No bruits or organomegaly, bowel sounds nl  MS:  warm without deformities, calf tenderness, cyanosis or clubbing  CXR  08/29/2012 :  There is overall hyperinflation configuration. No consolidation or pleural effusion is evident. There is central peribronchial thickening.   Assessment:           Plan:

## 2012-08-29 NOTE — Progress Notes (Signed)
Quick Note:  Spoke with patient, made her aware of results and recs as listed below per Dr. Sherene Sires. Pt verbalized understanding and nothing further needed at this time. ______

## 2012-08-29 NOTE — Patient Instructions (Addendum)
Prilosec 40 mg Take 30-60 min before first meal of the day and add Pepcid ac 20 mg at bedtime  Stop breo  Chantix starter pack  symbicort 160 Take 2 puffs first thing in am and then another 2 puffs about 12 hours later but work on technique    Only use your albuterol (proaire)as a rescue medication to be used if you can't catch your breath by resting or doing a relaxed purse lip breathing pattern. The less you use it, the better it will work when you need it.  Ok to use it up to 2 puffs every 4 puffs if needed  Please schedule a follow up office visit in 2 weeks, sooner if needed to see [name]

## 2012-08-30 NOTE — Assessment & Plan Note (Signed)
>   3 min  > 3 min discussion  I emphasized that although we never turn away smokers from the pulmonary clinic, we do ask that they understand that the recommendations that we make  won't work nearly as well in the presence of continued cigarette exposure.  In fact, we may very well  reach a point where we can't promise to help the patient if he/she can't quit smoking. (We can and will promise to try to help, we just can't promise what we recommend will really work)   She agreed to restart chantix which worked in the past

## 2012-08-30 NOTE — Assessment & Plan Note (Signed)
DDX of  difficult airways managment all start with A and  include Adherence, Ace Inhibitors, Acid Reflux, Active Sinus Disease, Alpha 1 Antitripsin deficiency, Anxiety masquerading as Airways dz,  ABPA,  allergy(esp in young), Aspiration (esp in elderly), Adverse effects of DPI,  Active smokers, plus two Bs  = Bronchiectasis and Beta blocker use..and one C= CHF   Adherence is always the initial "prime suspect" and is a multilayered concern that requires a "trust but verify" approach in every patient - starting with knowing how to use medications, especially inhalers, correctly, keeping up with refills and understanding the fundamental difference between maintenance and prns vs those medications only taken for a very short course and then stopped and not refilled. The proper method of use, as well as anticipated side effects, of a metered-dose inhaler are discussed and demonstrated to the patient. Improved effectiveness after extensive coaching during this visit to a level of approximately  75% so change to symbicort 160 2bid which worked well in past  ? Adverse effect of dpi > d/c breo  ? Acid reflux > hard to exclude given her hx > rx empirically  Active smoking > discussed separately

## 2012-09-03 ENCOUNTER — Ambulatory Visit (INDEPENDENT_AMBULATORY_CARE_PROVIDER_SITE_OTHER): Payer: BC Managed Care – PPO | Admitting: Gastroenterology

## 2012-09-03 ENCOUNTER — Encounter: Payer: Self-pay | Admitting: Gastroenterology

## 2012-09-03 VITALS — BP 80/60 | HR 89 | Ht 63.5 in | Wt 109.2 lb

## 2012-09-03 DIAGNOSIS — R112 Nausea with vomiting, unspecified: Secondary | ICD-10-CM

## 2012-09-03 DIAGNOSIS — F3162 Bipolar disorder, current episode mixed, moderate: Secondary | ICD-10-CM

## 2012-09-03 DIAGNOSIS — K589 Irritable bowel syndrome without diarrhea: Secondary | ICD-10-CM

## 2012-09-03 DIAGNOSIS — J438 Other emphysema: Secondary | ICD-10-CM

## 2012-09-03 DIAGNOSIS — R197 Diarrhea, unspecified: Secondary | ICD-10-CM

## 2012-09-03 NOTE — Patient Instructions (Addendum)
  You have been given a separate informational sheet regarding your tobacco use, the importance of quitting and local resources to help you quit.  You have been scheduled with [facility] on Sep 05, 2012 at 3:30 pm. Please arrive 15 minutes early for registration. [facility] new address is:   [address] number: [phone] _________________________________________________________________________________________                                               We are excited to introduce MyChart, a new best-in-class service that provides you online access to important information in your electronic medical record. We want to make it easier for you to view your health information - all in one secure location - when and where you need it. We expect MyChart will enhance the quality of care and service we provide.  When you register for MyChart, you can:    View your test results.    Request appointments and receive appointment reminders via email.    Request medication renewals.    View your medical history, allergies, medications and immunizations.    Communicate with your physician's office through a password-protected site.    Conveniently print information such as your medication lists.  To find out if MyChart is right for you, please talk to a member of our clinical staff today. We will gladly answer your questions about this free health and wellness tool.  If you are age 68 or older and want a member of your family to have access to your record, you must provide written consent by completing a proxy form available at our office. Please speak to our clinical staff about guidelines regarding accounts for patients younger than age 6.  As you activate your MyChart account and need any technical assistance, please call the MyChart technical support line at [phone]) 83-CHART [phone]) or email your question to [email or web address]. If you  email your question(s), please include your name, a return phone number and the best time to reach you.  If you have non-urgent health-related questions, you can send a message to our office through MyChart at [email or web address]. If you have a medical emergency, call 911.  Thank you for using MyChart as your new health and wellness resource!   MyChart licensed from [employer],  4782-9562. Patents Pending.

## 2012-09-03 NOTE — Progress Notes (Signed)
This is a very complex 37 year old Caucasian female with chronic depression multiple psychotropic medications.  She has a long history of IBS, diarrhea predominant, and is recently been suffering from intractable nausea and vomiting with a recent negative endoscopy with normal small bowel biopsies.  She's had followup colonoscopy with colon biopsies within the last 2 years because of her chronic diarrhea, and therewass no evidence of microscopic or collagenous colitis.  Attempts to treat her with metoclopramide resulted in seizure activity, she says she cannot tolerate almost any medication because of nausea and vomiting.  She has chronic cardiac arrhythmia and also is on Cardura 2 mg a day, followed by cardiology, and also when necessary inhalers for COPD with continued cigarette use.  She currently is trying Chantix, Desyrel, Zoloft, Seroquel, Klonopin, and she is on Vicodin 5 days prn 6 hours for chronic pain syndrome.  Recent stool exams on several occasions for WBC, O&P, C. difficile, and routine culture and sensitivity of all been negative.  Labs has not had any particular abnormalities.save  a mild chronic anemia of chronic disease.  She been unable to tolerate oral potassium, Questran, and says Imodium and/or Lomotil have no effect on her  diarrhea.  She is on Prilosec 40 mg a day for acid reflux.  Previous attempts to treat her with Lotronex were unsuccessful because she says she could not keep this medication down because of nausea and vomiting.  CT scan of the abdomen was normal March 14.  She is status post cholecystectomy in 2000.  History also of chronic interstitial cystitis.  Current use of narcotics is unclear on reviewing her chart.  Current Medications, Allergies, Past Medical History, Past Surgical History, Family History and Social History were reviewed in [employer] record.  ROS: All systems were reviewed and are negative unless otherwise stated in the HPI.           Physical Exam: Multiple tattoos president a very thin but nontoxic-appearing white female.  Blood pressure 80/60, pulse 89 weight 109 with a BMI of 19.  Her abdomen shows no organomegaly, masses, distention, or true tenderness.  Bowel sounds are normal.  Rectal exam is deferred.    Assessment and Plan: Very difficult patient to treat because of her inability to tolerate almost any medications except for psychotropic medications and Zofran, .  I have referred her to [facility] for their opinion and treatment.  This is refractory, chronic, recurrent IBS- GI patient with multiple management problems managed by psychiatric and chronic pain problems.  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 No diagnosis found.

## 2012-09-11 ENCOUNTER — Other Ambulatory Visit: Payer: Self-pay | Admitting: Family Medicine

## 2012-09-11 NOTE — Telephone Encounter (Signed)
Last seen 08/26/12 and filled 08/13/12 #60. Please advise     KP

## 2012-09-12 ENCOUNTER — Ambulatory Visit: Payer: BC Managed Care – PPO | Admitting: Adult Health

## 2012-09-12 MED ORDER — CLONAZEPAM 1 MG PO TABS: ORAL_TABLET | ORAL | Status: DC

## 2012-09-12 NOTE — Addendum Note (Signed)
Addended by: Arnette Norris on: 09/12/2012 12:07 PM   Modules accepted: Orders

## 2012-09-13 ENCOUNTER — Other Ambulatory Visit: Payer: Self-pay | Admitting: Family Medicine

## 2012-09-15 ENCOUNTER — Encounter (HOSPITAL_COMMUNITY): Payer: Self-pay

## 2012-09-15 ENCOUNTER — Emergency Department (HOSPITAL_COMMUNITY)
Admission: EM | Admit: 2012-09-15 | Discharge: 2012-09-15 | Disposition: A | Discharge status: Home / Self Care | Payer: BC Managed Care – PPO | Attending: Emergency Medicine | Admitting: Emergency Medicine

## 2012-09-15 DIAGNOSIS — K589 Irritable bowel syndrome without diarrhea: Secondary | ICD-10-CM

## 2012-09-15 DIAGNOSIS — Z8541 Personal history of malignant neoplasm of cervix uteri: Secondary | ICD-10-CM | POA: Insufficient documentation

## 2012-09-15 DIAGNOSIS — G43909 Migraine, unspecified, not intractable, without status migrainosus: Secondary | ICD-10-CM | POA: Insufficient documentation

## 2012-09-15 DIAGNOSIS — J449 Chronic obstructive pulmonary disease, unspecified: Secondary | ICD-10-CM | POA: Insufficient documentation

## 2012-09-15 DIAGNOSIS — G8929 Other chronic pain: Secondary | ICD-10-CM | POA: Insufficient documentation

## 2012-09-15 DIAGNOSIS — Z8742 Personal history of other diseases of the female genital tract: Secondary | ICD-10-CM | POA: Insufficient documentation

## 2012-09-15 DIAGNOSIS — Z79899 Other long term (current) drug therapy: Secondary | ICD-10-CM | POA: Insufficient documentation

## 2012-09-15 DIAGNOSIS — R569 Unspecified convulsions: Secondary | ICD-10-CM | POA: Insufficient documentation

## 2012-09-15 DIAGNOSIS — Z8719 Personal history of other diseases of the digestive system: Secondary | ICD-10-CM | POA: Insufficient documentation

## 2012-09-15 DIAGNOSIS — G4730 Sleep apnea, unspecified: Secondary | ICD-10-CM | POA: Insufficient documentation

## 2012-09-15 DIAGNOSIS — R109 Unspecified abdominal pain: Secondary | ICD-10-CM

## 2012-09-15 DIAGNOSIS — J4489 Other specified chronic obstructive pulmonary disease: Secondary | ICD-10-CM | POA: Insufficient documentation

## 2012-09-15 DIAGNOSIS — Z3202 Encounter for pregnancy test, result negative: Secondary | ICD-10-CM | POA: Insufficient documentation

## 2012-09-15 DIAGNOSIS — D649 Anemia, unspecified: Secondary | ICD-10-CM | POA: Insufficient documentation

## 2012-09-15 DIAGNOSIS — K219 Gastro-esophageal reflux disease without esophagitis: Secondary | ICD-10-CM | POA: Insufficient documentation

## 2012-09-15 DIAGNOSIS — R112 Nausea with vomiting, unspecified: Secondary | ICD-10-CM | POA: Insufficient documentation

## 2012-09-15 DIAGNOSIS — M129 Arthropathy, unspecified: Secondary | ICD-10-CM | POA: Insufficient documentation

## 2012-09-15 DIAGNOSIS — F319 Bipolar disorder, unspecified: Secondary | ICD-10-CM | POA: Insufficient documentation

## 2012-09-15 DIAGNOSIS — IMO0001 Reserved for inherently not codable concepts without codable children: Secondary | ICD-10-CM | POA: Insufficient documentation

## 2012-09-15 DIAGNOSIS — F17200 Nicotine dependence, unspecified, uncomplicated: Secondary | ICD-10-CM | POA: Insufficient documentation

## 2012-09-15 DIAGNOSIS — I10 Essential (primary) hypertension: Secondary | ICD-10-CM | POA: Insufficient documentation

## 2012-09-15 DIAGNOSIS — Z8679 Personal history of other diseases of the circulatory system: Secondary | ICD-10-CM | POA: Insufficient documentation

## 2012-09-15 DIAGNOSIS — F341 Dysthymic disorder: Secondary | ICD-10-CM | POA: Insufficient documentation

## 2012-09-15 DIAGNOSIS — R197 Diarrhea, unspecified: Secondary | ICD-10-CM | POA: Insufficient documentation

## 2012-09-15 DIAGNOSIS — F1320 Sedative, hypnotic or anxiolytic dependence, uncomplicated: Secondary | ICD-10-CM | POA: Insufficient documentation

## 2012-09-15 DIAGNOSIS — Z87442 Personal history of urinary calculi: Secondary | ICD-10-CM | POA: Insufficient documentation

## 2012-09-15 DIAGNOSIS — E785 Hyperlipidemia, unspecified: Secondary | ICD-10-CM | POA: Insufficient documentation

## 2012-09-15 LAB — CBC WITH DIFFERENTIAL/PLATELET
Basophils Absolute: 0 K/uL (ref 0.0–0.1)
Basophils Relative: 0 % (ref 0–1)
Eosinophils Absolute: 0.2 K/uL (ref 0.0–0.7)
Eosinophils Relative: 2 % (ref 0–5)
HCT: 36 % (ref 36.0–46.0)
Hemoglobin: 12 g/dL (ref 12.0–15.0)
Lymphocytes Relative: 37 % (ref 12–46)
Lymphs Abs: 2.9 K/uL (ref 0.7–4.0)
MCH: 30.5 pg (ref 26.0–34.0)
MCHC: 33.3 g/dL (ref 30.0–36.0)
MCV: 91.6 fL (ref 78.0–100.0)
Monocytes Absolute: 0.5 K/uL (ref 0.1–1.0)
Monocytes Relative: 6 % (ref 3–12)
Neutro Abs: 4.3 K/uL (ref 1.7–7.7)
Neutrophils Relative %: 55 % (ref 43–77)
Platelets: 231 K/uL (ref 150–400)
RBC: 3.93 MIL/uL (ref 3.87–5.11)
RDW: 12.2 % (ref 11.5–15.5)
WBC: 7.9 K/uL (ref 4.0–10.5)

## 2012-09-15 LAB — URINALYSIS, ROUTINE W REFLEX MICROSCOPIC
Bilirubin Urine: NEGATIVE
Glucose, UA: NEGATIVE mg/dL
Hgb urine dipstick: NEGATIVE
Ketones, ur: NEGATIVE mg/dL
Leukocytes, UA: NEGATIVE
Nitrite: NEGATIVE
Protein, ur: NEGATIVE mg/dL
Specific Gravity, Urine: 1.007 (ref 1.005–1.030)
Urobilinogen, UA: 0.2 mg/dL (ref 0.0–1.0)
pH: 7 (ref 5.0–8.0)

## 2012-09-15 LAB — COMPREHENSIVE METABOLIC PANEL WITH GFR
ALT: 9 U/L (ref 0–35)
AST: 20 U/L (ref 0–37)
Albumin: 3 g/dL — ABNORMAL LOW (ref 3.5–5.2)
Alkaline Phosphatase: 83 U/L (ref 39–117)
BUN: 5 mg/dL — ABNORMAL LOW (ref 6–23)
CO2: 25 meq/L (ref 19–32)
Calcium: 8.4 mg/dL (ref 8.4–10.5)
Chloride: 112 meq/L (ref 96–112)
Creatinine, Ser: 0.48 mg/dL — ABNORMAL LOW (ref 0.50–1.10)
GFR calc Af Amer: >90 mL/min
GFR calc non Af Amer: >90 mL/min
Glucose, Bld: 90 mg/dL (ref 70–99)
Potassium: 3.3 meq/L — ABNORMAL LOW (ref 3.5–5.1)
Sodium: 143 meq/L (ref 135–145)
Total Bilirubin: 0.2 mg/dL — ABNORMAL LOW (ref 0.3–1.2)
Total Protein: 6.1 g/dL (ref 6.0–8.3)

## 2012-09-15 LAB — POCT I-STAT, CHEM 8
BUN: <3 mg/dL — ABNORMAL LOW (ref 6–23)
Calcium, Ion: 1.13 mmol/L (ref 1.12–1.23)
Chloride: 111 mEq/L (ref 96–112)
Creatinine, Ser: 0.6 mg/dL (ref 0.50–1.10)
Glucose, Bld: 89 mg/dL (ref 70–99)
HCT: 35 % — ABNORMAL LOW (ref 36.0–46.0)
Hemoglobin: 11.9 g/dL — ABNORMAL LOW (ref 12.0–15.0)
Potassium: 3.4 mEq/L — ABNORMAL LOW (ref 3.5–5.1)
Sodium: 145 mEq/L (ref 135–145)
TCO2: 25 mmol/L (ref 0–100)

## 2012-09-15 LAB — PREGNANCY, URINE: Preg Test, Ur: NEGATIVE

## 2012-09-15 LAB — LIPASE, BLOOD: Lipase: 47 U/L (ref 11–59)

## 2012-09-15 MED ORDER — LORAZEPAM 1 MG PO TABS
0.5000 mg | ORAL_TABLET | Freq: Once | ORAL | Status: AC
  Administered 2012-09-15: 0.5 mg via ORAL
  Filled 2012-09-15: qty 1

## 2012-09-15 MED ORDER — LORAZEPAM 2 MG/ML IJ SOLN
1.0000 mg | Freq: Once | INTRAMUSCULAR | Status: AC
  Administered 2012-09-15: 1 mg via INTRAVENOUS
  Filled 2012-09-15: qty 1

## 2012-09-15 NOTE — ED Provider Notes (Signed)
Medical screening examination/treatment/procedure(s) were performed by non-physician practitioner and as supervising physician I was immediately available for consultation/collaboration.    Celene Kras, MD 09/15/12 [phone]

## 2012-09-15 NOTE — ED Provider Notes (Signed)
History     CSN: [id number]  Arrival date & time 09/15/12  1753   First MD Initiated Contact with Patient 09/15/12 1753      Chief Complaint  Patient presents with  . Abdominal Pain    (Consider location/radiation/quality/duration/timing/severity/associated sxs/prior treatment) HPI  Patient presents by EMS for acute abdominal pain that started 1.5 hours prior to arrival.   Per reviewing her chart is is complex with chronic depression, chronic IBS, diarrhea predominant, intractable N/V, with negative endoscopy with normal small bowel biopsies and abdominal CTs. She had a recent colon biopsy within the last two years because of her chronic diarrhea and no abnormality was found. She is having a repeat colonoscopy within the next weeks with GI specialist at [facility].  " she says she cannot tolerate almost any medication because of nausea and vomiting." She has chronic cardiac arrhythmia and also is on Cardura 2 mg a day, followed by cardiology, and also when necessary inhalers for COPD with continued cigarette use. She currently is trying Chantix, Desyrel, Zoloft, Seroquel, Klonopin, and she is on Vicodin 5 days prn 6 hours for chronic pain syndrome. Recent stool exams on several occasions for WBC, O&P, C. difficile, and routine culture and sensitivity of all been negative. Labs has not had any particular abnormalities.save a mild chronic anemia of chronic disease. She been unable to tolerate oral potassium, Questran, and says Imodium and/or Lomotil have no effect on her diarrhea. She is on Prilosec 40 mg a day for acid reflux. Previous attempts to treat her with Lotronex were unsuccessful because she says she could not keep this medication down because of nausea and vomiting. CT scan of the abdomen was normal March 14. She is status post cholecystectomy in 2000. History also of chronic interstitial cystitis. Current use of narcotics is unclear on reviewing her chart. - per Dr. Tobe Sos.  Today  she is having more of her usual symptoms. EMS gave her 2 mg IV DIlaudid en route which she describes did not touch her pain and it is still 10/10    Past Medical History  Diagnosis Date  . 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 SLEEP APNEA 08/31/2007  . 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 DEPRESSION 08/31/2007  . ARTHRITIS 08/31/2007  . HYPERTENSION 08/31/2007  . Chronic interstitial cystitis 03/11/2009  . IBS 03/11/2009  . GERD 02/05/2009  . BRONCHITIS, RECURRENT 08/23/2009    Asthmatic Bronchitis-Dr. Sherene Sires.....-HFA 75% 12/04/2008>75% 02/05/2009>75% 08/04/2009 -PFT's 01/04/2009 2.56 (86%) ratio 75, no resp to B2 and DLC0 67% > 80 after correction   . Anal fissure 03/11/2009  . RECTAL BLEEDING 03/11/2009  . COLONIC POLYPS, HX OF 07/25/2006    ADENOMATOUS POLYP  . Migraine headache   . Chronic pain   . Chronic nausea   . COPD (chronic obstructive pulmonary disease)   . Asthma   . Anxiety and depression   . Uterine cyst   . Endometriosis   . Bowel obstruction   . Colon polyps   . Internal hemorrhoids   . Cancer     cervical cancer  . FIBROMYALGIA 08/31/2007  . Arthritis   . Anemia   . Anxiety   . Hyperlipidemia   . Seizures     been about 1 year since last seisure per pt  . Bipolar 1 disorder     Past Surgical History  Procedure Laterality Date  . Cholecystectomy    . Abdominal hysterectomy    . Pacemaker insertion  in hip for interstitial cystitis  . Bladder surgery      stimulator placed and stretching   . Interstitial cystitis    . Bladder stretching x6    . Replaced bladder pacemaker    . Removal of uterine cyst and scrapped uterus    . Colonoscopy      Family History  Problem Relation Age of Onset  . Heart disease Father   . Asthma Maternal Grandmother   . Emphysema Maternal Grandfather   . Cancer Maternal Grandfather     Lung Cancer  . Cancer Other     Lung Cancer-Aunt  . Colon cancer Neg Hx   . Esophageal cancer Neg Hx    . Rectal cancer Neg Hx   . Stomach cancer Neg Hx     History  Substance Use Topics  . Smoking status: Current Every Day Smoker -- 1.00 packs/day for 14 years    Types: Cigarettes  . Smokeless tobacco: Never Used  . Alcohol Use: Yes     Comment: occasional wine     OB History   Grav Para Term Preterm Abortions TAB SAB Ect Mult Living                  Review of Systems  Gastrointestinal: Positive for nausea, vomiting, abdominal pain and diarrhea.  All other systems reviewed and are negative.    Allergies  Abilify; Metoclopramide hcl; Ambien; Eszopiclone; Morphine and related; Tramadol; and Propoxyphene-acetaminophen  Home Medications   Current Outpatient Rx  Name  Route  Sig  Dispense  Refill  . albuterol (PROAIR HFA) 108 (90 BASE) MCG/ACT inhaler   Inhalation   Inhale 2 puffs into the lungs every 6 (six) hours as needed for wheezing or shortness of breath.          . budesonide-formoterol (SYMBICORT) 160-4.5 MCG/ACT inhaler   Inhalation   Inhale 2 puffs into the lungs 2 (two) times daily.         . cholecalciferol (VITAMIN D) 1000 UNITS tablet   Oral   Take 1,000 Units by mouth daily.         . clonazePAM (KLONOPIN) 1 MG tablet   Oral   Take 1 mg by mouth 2 (two) times daily as needed for anxiety.         . cyanocobalamin 1000 MCG tablet   Oral   Take 100 mcg by mouth 3 (three) times daily.         Marland Kitchen doxazosin (CARDURA) 2 MG tablet   Oral   Take 2 mg by mouth at bedtime.         Marland Kitchen estradiol (ESTRACE) 0.5 MG tablet   Oral   Take 0.5 mg by mouth daily.         . ferrous gluconate (FERGON) 325 MG tablet   Oral   Take 325 mg by mouth daily with breakfast.         . flavoxATE (URISPAS) 100 MG tablet   Oral   Take 100 mg by mouth 3 (three) times daily as needed.         . folic acid (FOLVITE) 1 MG tablet   Oral   Take 1 mg by mouth daily.         . Hydrocodone-Acetaminophen (VICODIN) 5-300 MG TABS   Oral   Take 1 tablet by  mouth every 6 (six) hours as needed (pain).         Marland Kitchen ibuprofen (ADVIL,MOTRIN) 800 MG tablet  Oral   Take 800 mg by mouth every 8 (eight) hours as needed for pain.          . Multiple Vitamin (MULITIVITAMIN WITH MINERALS) TABS   Oral   Take 1 tablet by mouth daily. Chewable multivitamin.         Marland Kitchen omeprazole (PRILOSEC) 40 MG capsule   Oral   Take 1 capsule (40 mg total) by mouth daily.   30 capsule   3   . ondansetron (ZOFRAN-ODT) 8 MG disintegrating tablet   Oral   Take 1 tablet (8 mg total) by mouth every 8 (eight) hours as needed for nausea.   30 tablet   3   . QUEtiapine (SEROQUEL XR) 400 MG 24 hr tablet   Oral   Take 400 mg by mouth at bedtime.         . sertraline (ZOLOFT) 100 MG tablet   Oral   Take 100 mg by mouth daily.         . traZODone (DESYREL) 50 MG tablet   Oral   Take 0.5-1 tablets (25-50 mg total) by mouth at bedtime as needed for sleep.   30 tablet   0   . varenicline (CHANTIX) 1 MG tablet   Oral   Take 1 mg by mouth 2 (two) times daily.           BP 105/80  Pulse 75  Temp(Src) 97.1 F (36.2 C) (Oral)  Resp 22  SpO2 95%  Physical Exam  Constitutional: She appears well-developed and well-nourished.  HENT:  Head: Normocephalic and atraumatic.  Eyes: Conjunctivae are normal. Pupils are equal, round, and reactive to light.  Neck: Trachea normal, normal range of motion and full passive range of motion without pain. Neck supple.  Cardiovascular: Normal rate, regular rhythm and normal pulses.   Pulmonary/Chest: Effort normal and breath sounds normal. Chest wall is not dull to percussion. She exhibits no tenderness, no crepitus, no edema, no deformity and no retraction.  Abdominal: Soft. Normal appearance and bowel sounds are normal. She exhibits no distension and no mass. There is tenderness (diffuse). There is no rebound and no guarding.  Musculoskeletal: Normal range of motion.  Neurological: She is alert. She has normal strength.   Skin: Skin is warm, dry and intact.  Psychiatric: She has a normal mood and affect. Her speech is normal and behavior is normal. Judgment and thought content normal. Cognition and memory are normal.    ED Course  Procedures (including critical care time)  Labs Reviewed  COMPREHENSIVE METABOLIC PANEL - Abnormal; Notable for the following:    Potassium 3.3 (*)    BUN 5 (*)    Creatinine, Ser 0.48 (*)    Albumin 3.0 (*)    Total Bilirubin 0.2 (*)    All other components within normal limits  POCT I-STAT, CHEM 8 - Abnormal; Notable for the following:    Potassium 3.4 (*)    BUN <3 (*)    Hemoglobin 11.9 (*)    HCT 35.0 (*)    All other components within normal limits  URINALYSIS, ROUTINE W REFLEX MICROSCOPIC  PREGNANCY, URINE  CBC WITH DIFFERENTIAL  LIPASE, BLOOD   No results found.   1. Abdominal pain   2. IBS (irritable bowel syndrome)       MDM  Patients blood pressure runs low, reviewed previous records.  Pt given 1mg  IM ativan. Lab results are unremarkable. Pt pain is improved although not completely relieved.    37  y.o.Candyce C Hase's evaluation in the Emergency Department is complete. It has been determined that no acute conditions requiring further emergency intervention are present at this time. The patient/guardian have been advised of the diagnosis and plan. We have discussed signs and symptoms that warrant return to the ED, such as changes or worsening in symptoms.  Vital signs are stable at discharge. Filed Vitals:   09/15/12 1915  BP: 105/80  Pulse: 75  Temp:   Resp:     Patient/guardian has voiced understanding and agreed to follow-up with the PCP or specialist.        Dorthula Matas, PA-C 09/15/12 2046

## 2012-09-15 NOTE — ED Notes (Addendum)
Pt is visibly upset. Pt states she has colitis, and is supposed to get a colonoscopy on 6/19. Pt states she got really cold and began shaking and started to have some abd cramping. Pt states this does not feel like a colitis flair up it feels worse. Pt rates pain 10/10 after receiving 2mg  dilaudid en route. Pt also states she has had diarrhea x 4mos.

## 2012-09-15 NOTE — ED Notes (Signed)
Discharge instructions reviewed. Pt verbalized understanding.  

## 2012-09-15 NOTE — ED Notes (Addendum)
Pt arrives via [facility] from the [place]. Pt was tubing down the [facility]. Abdominal pain today. Denies vomiting. Diarrhea present.  Dilaudid 2 mg IVP given by EMS

## 2012-09-16 ENCOUNTER — Encounter (HOSPITAL_COMMUNITY): Payer: Self-pay | Admitting: Unknown Physician Specialty

## 2012-09-16 ENCOUNTER — Emergency Department (HOSPITAL_COMMUNITY)
Admission: EM | Admit: 2012-09-16 | Discharge: 2012-09-17 | Disposition: A | Discharge status: Home / Self Care | Payer: BC Managed Care – PPO | Attending: Emergency Medicine | Admitting: Emergency Medicine

## 2012-09-16 DIAGNOSIS — Z860100 Personal history of colon polyps, unspecified: Secondary | ICD-10-CM | POA: Insufficient documentation

## 2012-09-16 DIAGNOSIS — G8929 Other chronic pain: Secondary | ICD-10-CM | POA: Insufficient documentation

## 2012-09-16 DIAGNOSIS — F1210 Cannabis abuse, uncomplicated: Secondary | ICD-10-CM

## 2012-09-16 DIAGNOSIS — R1032 Left lower quadrant pain: Secondary | ICD-10-CM | POA: Insufficient documentation

## 2012-09-16 DIAGNOSIS — Z8739 Personal history of other diseases of the musculoskeletal system and connective tissue: Secondary | ICD-10-CM | POA: Insufficient documentation

## 2012-09-16 DIAGNOSIS — Z8719 Personal history of other diseases of the digestive system: Secondary | ICD-10-CM | POA: Insufficient documentation

## 2012-09-16 DIAGNOSIS — K219 Gastro-esophageal reflux disease without esophagitis: Secondary | ICD-10-CM | POA: Insufficient documentation

## 2012-09-16 DIAGNOSIS — J45909 Unspecified asthma, uncomplicated: Secondary | ICD-10-CM | POA: Insufficient documentation

## 2012-09-16 DIAGNOSIS — Z8679 Personal history of other diseases of the circulatory system: Secondary | ICD-10-CM | POA: Insufficient documentation

## 2012-09-16 DIAGNOSIS — Z8669 Personal history of other diseases of the nervous system and sense organs: Secondary | ICD-10-CM | POA: Insufficient documentation

## 2012-09-16 DIAGNOSIS — E785 Hyperlipidemia, unspecified: Secondary | ICD-10-CM | POA: Insufficient documentation

## 2012-09-16 DIAGNOSIS — I10 Essential (primary) hypertension: Secondary | ICD-10-CM | POA: Insufficient documentation

## 2012-09-16 DIAGNOSIS — F17200 Nicotine dependence, unspecified, uncomplicated: Secondary | ICD-10-CM | POA: Insufficient documentation

## 2012-09-16 DIAGNOSIS — Z87448 Personal history of other diseases of urinary system: Secondary | ICD-10-CM | POA: Insufficient documentation

## 2012-09-16 DIAGNOSIS — J4489 Other specified chronic obstructive pulmonary disease: Secondary | ICD-10-CM | POA: Insufficient documentation

## 2012-09-16 DIAGNOSIS — F411 Generalized anxiety disorder: Secondary | ICD-10-CM | POA: Insufficient documentation

## 2012-09-16 DIAGNOSIS — Z79899 Other long term (current) drug therapy: Secondary | ICD-10-CM | POA: Insufficient documentation

## 2012-09-16 DIAGNOSIS — J449 Chronic obstructive pulmonary disease, unspecified: Secondary | ICD-10-CM | POA: Insufficient documentation

## 2012-09-16 DIAGNOSIS — Z8541 Personal history of malignant neoplasm of cervix uteri: Secondary | ICD-10-CM | POA: Insufficient documentation

## 2012-09-16 DIAGNOSIS — R109 Unspecified abdominal pain: Secondary | ICD-10-CM

## 2012-09-16 DIAGNOSIS — F319 Bipolar disorder, unspecified: Secondary | ICD-10-CM | POA: Insufficient documentation

## 2012-09-16 DIAGNOSIS — Z8709 Personal history of other diseases of the respiratory system: Secondary | ICD-10-CM | POA: Insufficient documentation

## 2012-09-16 DIAGNOSIS — Z8742 Personal history of other diseases of the female genital tract: Secondary | ICD-10-CM | POA: Insufficient documentation

## 2012-09-16 DIAGNOSIS — R4182 Altered mental status, unspecified: Secondary | ICD-10-CM

## 2012-09-16 MED ORDER — SODIUM CHLORIDE 0.9 % IV BOLUS (SEPSIS)
1000.0000 mL | Freq: Once | INTRAVENOUS | Status: AC
  Administered 2012-09-16: 1000 mL via INTRAVENOUS

## 2012-09-16 NOTE — ED Notes (Addendum)
Patient states she is suppose to have surgery on Wednesday. She can not tell me what kind. Used ammonia to arose her. Patient denies any alcohol intake or drugs. She states she only took one of her pain pills this morning.

## 2012-09-16 NOTE — ED Notes (Signed)
Patient arrived via [place] [facility] with abdominal pain. Patient was seen here yesterday for the same complaint. Upon EMS arrival patient was found unresponsive, shallow breathing. EMS administered Narcan 1mg , Ammonia x4 and patient was responsive. Patient has a pacemaker for her bladder implanted in her butt.

## 2012-09-17 LAB — CG4 I-STAT (LACTIC ACID): Lactic Acid, Venous: <0.3 mmol/L — ABNORMAL LOW (ref 0.5–2.2)

## 2012-09-17 LAB — CBC WITH DIFFERENTIAL/PLATELET
Basophils Absolute: 0 10*3/uL (ref 0.0–0.1)
Basophils Relative: 1 % (ref 0–1)
Eosinophils Absolute: 0.3 10*3/uL (ref 0.0–0.7)
Eosinophils Relative: 4 % (ref 0–5)
HCT: 32.7 % — ABNORMAL LOW (ref 36.0–46.0)
Hemoglobin: 11 g/dL — ABNORMAL LOW (ref 12.0–15.0)
Lymphocytes Relative: 40 % (ref 12–46)
Lymphs Abs: 2.6 10*3/uL (ref 0.7–4.0)
MCH: 31.5 pg (ref 26.0–34.0)
MCHC: 33.6 g/dL (ref 30.0–36.0)
MCV: 93.7 fL (ref 78.0–100.0)
Monocytes Absolute: 0.4 10*3/uL (ref 0.1–1.0)
Monocytes Relative: 6 % (ref 3–12)
Neutro Abs: 3.2 10*3/uL (ref 1.7–7.7)
Neutrophils Relative %: 50 % (ref 43–77)
Platelets: 205 10*3/uL (ref 150–400)
RBC: 3.49 MIL/uL — ABNORMAL LOW (ref 3.87–5.11)
RDW: 12.4 % (ref 11.5–15.5)
WBC: 6.5 10*3/uL (ref 4.0–10.5)

## 2012-09-17 LAB — URINALYSIS, ROUTINE W REFLEX MICROSCOPIC
Bilirubin Urine: NEGATIVE
Glucose, UA: NEGATIVE mg/dL
Hgb urine dipstick: NEGATIVE
Ketones, ur: NEGATIVE mg/dL
Leukocytes, UA: NEGATIVE
Nitrite: NEGATIVE
Protein, ur: NEGATIVE mg/dL
Specific Gravity, Urine: 1.01 (ref 1.005–1.030)
Urobilinogen, UA: 1 mg/dL (ref 0.0–1.0)
pH: 7 (ref 5.0–8.0)

## 2012-09-17 LAB — COMPREHENSIVE METABOLIC PANEL
ALT: 9 U/L (ref 0–35)
AST: 28 U/L (ref 0–37)
Albumin: 2.8 g/dL — ABNORMAL LOW (ref 3.5–5.2)
Alkaline Phosphatase: 73 U/L (ref 39–117)
BUN: 7 mg/dL (ref 6–23)
CO2: 29 mEq/L (ref 19–32)
Calcium: 8.3 mg/dL — ABNORMAL LOW (ref 8.4–10.5)
Chloride: 111 mEq/L (ref 96–112)
Creatinine, Ser: 0.68 mg/dL (ref 0.50–1.10)
GFR calc Af Amer: >90 mL/min (ref >90)
GFR calc non Af Amer: >90 mL/min (ref >90)
Glucose, Bld: 88 mg/dL (ref 70–99)
Potassium: 3.3 mEq/L — ABNORMAL LOW (ref 3.5–5.1)
Sodium: 143 mEq/L (ref 135–145)
Total Bilirubin: 0.2 mg/dL — ABNORMAL LOW (ref 0.3–1.2)
Total Protein: 5.5 g/dL — ABNORMAL LOW (ref 6.0–8.3)

## 2012-09-17 LAB — RAPID URINE DRUG SCREEN, HOSP PERFORMED
Amphetamines: NOT DETECTED
Barbiturates: NOT DETECTED
Benzodiazepines: NOT DETECTED
Cocaine: NOT DETECTED
Opiates: POSITIVE — AB
Tetrahydrocannabinol: POSITIVE — AB

## 2012-09-17 MED ORDER — DICYCLOMINE HCL 20 MG PO TABS: 20.0000 mg | ORAL_TABLET | Freq: Four times a day (QID) | ORAL | Status: DC | PRN

## 2012-09-17 MED ORDER — DICYCLOMINE HCL 10 MG/ML IM SOLN
20.0000 mg | Freq: Once | INTRAMUSCULAR | Status: AC
  Administered 2012-09-17: 20 mg via INTRAMUSCULAR
  Filled 2012-09-17: qty 2

## 2012-09-17 NOTE — ED Provider Notes (Addendum)
History     CSN: [id number]  Arrival date & time 09/16/12  2217   First MD Initiated Contact with Patient 09/16/12 2300      Chief Complaint  Patient presents with  . Abdominal Pain    (Consider location/radiation/quality/duration/timing/severity/associated sxs/prior treatment) HPI 36 yo female presents to the ER via EMS from home.  Mother gives history as patient is very somnolent. She reports pt has been complaining of severe left lower abdominal pain since yesterday morning.  Pt seen in the [facility] yesterday afternoon with unremarkable eval.  Today around 7 pm pt was very sleepy and less responsive.  EMS reports pt had pinpoint pupils, shallow breathing.  Pt was given Narcan and ammonia inhalant with return of consciousness.  Mother does not feel patient is taking more medications than she is prescribed.  Mother reports patient only taking one vicodin a day, despite her severe abd pain.  Pt told mother after her ED visit yesterday she was told she needed surgery very soon-this is not reflected in the ED note.  She is scheduled to have surgery at [facility] on Wednesday, per records has colonoscopy scheduled.  Pt is able to wake briefly c/o abd pain, then falls back to sleep.  Past Medical History  Diagnosis Date  . 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 SLEEP APNEA 08/31/2007  . 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 DEPRESSION 08/31/2007  . ARTHRITIS 08/31/2007  . HYPERTENSION 08/31/2007  . Chronic interstitial cystitis 03/11/2009  . IBS 03/11/2009  . GERD 02/05/2009  . BRONCHITIS, RECURRENT 08/23/2009    Asthmatic Bronchitis-Dr. Sherene Sires.....-HFA 75% 12/04/2008>75% 02/05/2009>75% 08/04/2009 -PFT's 01/04/2009 2.56 (86%) ratio 75, no resp to B2 and DLC0 67% > 80 after correction   . Anal fissure 03/11/2009  . RECTAL BLEEDING 03/11/2009  . COLONIC POLYPS, HX OF 07/25/2006    ADENOMATOUS POLYP  . Migraine headache   . Chronic pain   . Chronic nausea   . COPD (chronic  obstructive pulmonary disease)   . Asthma   . Anxiety and depression   . Uterine cyst   . Endometriosis   . Bowel obstruction   . Colon polyps   . Internal hemorrhoids   . Cancer     cervical cancer  . FIBROMYALGIA 08/31/2007  . Arthritis   . Anemia   . Anxiety   . Hyperlipidemia   . Seizures     been about 1 year since last seisure per pt  . Bipolar 1 disorder     Past Surgical History  Procedure Laterality Date  . Cholecystectomy    . Abdominal hysterectomy    . Pacemaker insertion      in hip for interstitial cystitis  . Bladder surgery      stimulator placed and stretching   . Interstitial cystitis    . Bladder stretching x6    . Replaced bladder pacemaker    . Removal of uterine cyst and scrapped uterus    . Colonoscopy      Family History  Problem Relation Age of Onset  . Heart disease Father   . Asthma Maternal Grandmother   . Emphysema Maternal Grandfather   . Cancer Maternal Grandfather     Lung Cancer  . Cancer Other     Lung Cancer-Aunt  . Colon cancer Neg Hx   . Esophageal cancer Neg Hx   . Rectal cancer Neg Hx   . Stomach cancer Neg Hx     History  Substance Use Topics  .  Smoking status: Current Every Day Smoker -- 1.00 packs/day for 14 years    Types: Cigarettes  . Smokeless tobacco: Never Used  . Alcohol Use: Yes     Comment: occasional wine     OB History   Grav Para Term Preterm Abortions TAB SAB Ect Mult Living                  Review of Systems  Unable to perform ROS: Mental status change    Allergies  Abilify; Metoclopramide hcl; Tramadol; Ambien; Eszopiclone; Morphine and related; and Propoxyphene-acetaminophen  Home Medications   Current Outpatient Rx  Name  Route  Sig  Dispense  Refill  . albuterol (PROAIR HFA) 108 (90 BASE) MCG/ACT inhaler   Inhalation   Inhale 2 puffs into the lungs every 6 (six) hours as needed for wheezing or shortness of breath.          . budesonide-formoterol (SYMBICORT) 160-4.5 MCG/ACT  inhaler   Inhalation   Inhale 2 puffs into the lungs 2 (two) times daily.         . cholecalciferol (VITAMIN D) 1000 UNITS tablet   Oral   Take 1,000 Units by mouth every evening.          . clonazePAM (KLONOPIN) 1 MG tablet   Oral   Take 1 mg by mouth 2 (two) times daily as needed for anxiety.         . cyanocobalamin 1000 MCG tablet   Oral   Take 1,000 mcg by mouth daily.          Marland Kitchen doxazosin (CARDURA) 2 MG tablet   Oral   Take 2 mg by mouth at bedtime.         Marland Kitchen estradiol (ESTRACE) 0.5 MG tablet   Oral   Take 0.5 mg by mouth every evening.          . ferrous gluconate (FERGON) 325 MG tablet   Oral   Take 325 mg by mouth every evening.          . flavoxATE (URISPAS) 100 MG tablet   Oral   Take 100 mg by mouth 3 (three) times daily as needed (for muscle spasms).          . folic acid (FOLVITE) 1 MG tablet   Oral   Take 1 mg by mouth every evening.          Marland Kitchen Hydrocodone-Acetaminophen (VICODIN) 5-300 MG TABS   Oral   Take 1 tablet by mouth every 6 (six) hours as needed (pain).         Marland Kitchen ibuprofen (ADVIL,MOTRIN) 800 MG tablet   Oral   Take 800 mg by mouth every 8 (eight) hours as needed for pain.          . Multiple Vitamin (MULITIVITAMIN WITH MINERALS) TABS   Oral   Take 1 tablet by mouth daily. Chewable multivitamin.         Marland Kitchen omeprazole (PRILOSEC) 40 MG capsule   Oral   Take 1 capsule (40 mg total) by mouth daily.   30 capsule   3   . ondansetron (ZOFRAN-ODT) 8 MG disintegrating tablet   Oral   Take 1 tablet (8 mg total) by mouth every 8 (eight) hours as needed for nausea.   30 tablet   3   . QUEtiapine (SEROQUEL XR) 400 MG 24 hr tablet   Oral   Take 400 mg by mouth at bedtime.         Marland Kitchen  sertraline (ZOLOFT) 100 MG tablet   Oral   Take 100 mg by mouth every evening.          Marland Kitchen tetrahydrozoline 0.05 % ophthalmic solution   Both Eyes   Place 2 drops into both eyes 2 (two) times daily as needed (for dry eyes).           . traZODone (DESYREL) 50 MG tablet   Oral   Take 0.5-1 tablets (25-50 mg total) by mouth at bedtime as needed for sleep.   30 tablet   0   . varenicline (CHANTIX) 1 MG tablet   Oral   Take 1 mg by mouth 2 (two) times daily.         Marland Kitchen dicyclomine (BENTYL) 20 MG tablet   Oral   Take 1 tablet (20 mg total) by mouth every 6 (six) hours as needed (for abdominal cramping).   20 tablet   0     BP 106/62  Pulse 80  Temp(Src) 97.3 F (36.3 C) (Oral)  Resp 18  SpO2 98%  Physical Exam  Nursing note and vitals reviewed. Constitutional: She is oriented to person, place, and time. She appears well-developed and well-nourished. No distress.  Somnolent but arousable  HENT:  Head: Normocephalic and atraumatic.  Nose: Nose normal.  Mouth/Throat: Oropharynx is clear and moist.  Eyes: Conjunctivae and EOM are normal. Pupils are equal, round, and reactive to light.  Neck: Normal range of motion. Neck supple. No JVD present. No tracheal deviation present. No thyromegaly present.  Cardiovascular: Normal rate, regular rhythm, normal heart sounds and intact distal pulses.  Exam reveals no gallop and no friction rub.   No murmur heard. Pulmonary/Chest: Effort normal and breath sounds normal. No stridor. No respiratory distress. She has no wheezes. She has no rales. She exhibits no tenderness.  Abdominal: Soft. She exhibits no distension and no mass. There is tenderness (mild llq pain). There is no rebound and no guarding.  Hyperactive bowel sounds  Musculoskeletal: Normal range of motion. She exhibits no edema and no tenderness.  Lymphadenopathy:    She has no cervical adenopathy.  Neurological: She is alert and oriented to person, place, and time. She exhibits normal muscle tone. Coordination normal.  Skin: Skin is warm and dry. No rash noted. No erythema. No pallor.  Psychiatric: She has a normal mood and affect. Her behavior is normal. Judgment and thought content normal.    ED Course   Procedures (including critical care time)  Labs Reviewed  CBC WITH DIFFERENTIAL - Abnormal; Notable for the following:    RBC 3.49 (*)    Hemoglobin 11.0 (*)    HCT 32.7 (*)    All other components within normal limits  COMPREHENSIVE METABOLIC PANEL - Abnormal; Notable for the following:    Potassium 3.3 (*)    Calcium 8.3 (*)    Total Protein 5.5 (*)    Albumin 2.8 (*)    Total Bilirubin 0.2 (*)    All other components within normal limits  URINE RAPID DRUG SCREEN (HOSP PERFORMED) - Abnormal; Notable for the following:    Opiates POSITIVE (*)    Tetrahydrocannabinol POSITIVE (*)    All other components within normal limits  CG4 I-STAT (LACTIC ACID) - Abnormal; Notable for the following:    Lactic Acid, Venous <0.30 (*)    All other components within normal limits  URINALYSIS, ROUTINE W REFLEX MICROSCOPIC   No results found.   Date: 09/17/2012  Rate: 72  Rhythm: normal  sinus rhythm  QRS Axis: normal  Intervals: normal  ST/T Wave abnormalities: nonspecific T wave changes  Conduction Disutrbances:none  Narrative Interpretation:   Old EKG Reviewed: none available    1. Abdominal pain   2. Altered mental status   3. Marijuana abuse       MDM  37 yo female with acute on chronic abd pain per family.  Concern for overmedication, abuse of medication given her somnolence.  Will rx bentyl and have her f/u with [facility] as scheduled.        Olivia Mackie, MD 09/17/12 4782  Olivia Mackie, MD 09/17/12 [phone]

## 2012-10-09 ENCOUNTER — Other Ambulatory Visit: Payer: Self-pay | Admitting: Family Medicine

## 2012-10-10 NOTE — Telephone Encounter (Signed)
Last seen and filled 08/26/12 #30. Please advise     KP 

## 2012-10-14 ENCOUNTER — Other Ambulatory Visit: Payer: Self-pay | Admitting: Family Medicine

## 2012-10-15 ENCOUNTER — Other Ambulatory Visit: Payer: Self-pay | Admitting: Family Medicine

## 2012-10-15 NOTE — Telephone Encounter (Signed)
Last seen 08/26/12 and filled 09/12/12 #60. Please advise    KP

## 2012-10-18 ENCOUNTER — Telehealth: Payer: Self-pay | Admitting: Family Medicine

## 2012-10-18 MED ORDER — QUETIAPINE FUMARATE ER 400 MG PO TB24: 400.0000 mg | ORAL_TABLET | Freq: Every day | ORAL | Status: DC

## 2012-10-18 NOTE — Telephone Encounter (Signed)
Patient called requesting rx for seroquel xr. She states her pharmacy has been unable to contact us and she has been out for 5 days. Pt uses [facility] in [place].

## 2012-10-18 NOTE — Telephone Encounter (Signed)
Patient is calling back about this refill. Really wants before weekend because she has been out all week.

## 2012-11-01 ENCOUNTER — Telehealth: Payer: Self-pay | Admitting: *Deleted

## 2012-11-01 ENCOUNTER — Other Ambulatory Visit: Payer: Self-pay | Admitting: *Deleted

## 2012-11-01 MED ORDER — BUDESONIDE-FORMOTEROL FUMARATE 160-4.5 MCG/ACT IN AERO: 2.0000 | INHALATION_SPRAY | Freq: Two times a day (BID) | RESPIRATORY_TRACT | Status: DC

## 2012-11-01 MED ORDER — ALBUTEROL SULFATE HFA 108 (90 BASE) MCG/ACT IN AERS: 2.0000 | INHALATION_SPRAY | Freq: Four times a day (QID) | RESPIRATORY_TRACT | Status: DC | PRN

## 2012-11-01 NOTE — Telephone Encounter (Signed)
Message routed to pulmonary.

## 2012-11-01 NOTE — Telephone Encounter (Signed)
Rx refilled for symbicort 160/4.5 mcg.

## 2012-11-01 NOTE — Telephone Encounter (Signed)
Under the problem list chronic asthma or reactive airways disease is listed. It is stated that she sees Dr. Sherene Sires in the pulmonary clinic this should be sent to them.

## 2012-11-01 NOTE — Telephone Encounter (Signed)
Pt scheduled med calendar w/ TP 11/08/12. Pt aware to bring all meds with her including PRN and OTC. RX has been sent for rescue inhaler. Nothing further was needed

## 2012-11-01 NOTE — Telephone Encounter (Signed)
Did not keep appt to see [name] with all meds in hand so  1) make appt 2) give one albuterol inhaler  Let her know no further refills s ov with [name] first to verify what exactly she's taking and how she's taking it

## 2012-11-01 NOTE — Addendum Note (Signed)
Addended by: Tommie Sams on: 11/01/2012 05:06 PM   Modules accepted: Orders

## 2012-11-01 NOTE — Telephone Encounter (Signed)
Pharmacy is requesting a refill for ventolin inhaler. However this medication was never refilled by anyone at this office. The last time this medication was refilled was 08/29/12 and it was by an historical provider and the last ov was 08/26/12 with Dr. [name].  Please Advise.

## 2012-11-08 ENCOUNTER — Encounter: Payer: BC Managed Care – PPO | Admitting: Adult Health

## 2012-11-09 ENCOUNTER — Other Ambulatory Visit: Payer: Self-pay | Admitting: Family Medicine

## 2012-11-09 ENCOUNTER — Other Ambulatory Visit: Payer: Self-pay | Admitting: Gastroenterology

## 2012-11-11 NOTE — Telephone Encounter (Signed)
Last seen 08/26/12 and filled 10/09/12 #30. No UDS on file. Please advise      KP

## 2012-11-12 ENCOUNTER — Other Ambulatory Visit: Payer: Self-pay | Admitting: Family Medicine

## 2012-11-12 NOTE — Telephone Encounter (Signed)
Last seen 08/26/12 and filled 10/14/12 #60. No UDS on file. Please advise         KP

## 2012-11-15 ENCOUNTER — Telehealth: Payer: Self-pay | Admitting: *Deleted

## 2012-11-15 MED ORDER — CLONAZEPAM 1 MG PO TABS: ORAL_TABLET | ORAL | Status: DC

## 2012-11-15 NOTE — Telephone Encounter (Signed)
The line picked up then hung up.       KP

## 2012-11-15 NOTE — Telephone Encounter (Signed)
Pharmacy is requesting a refill for clonazepam 1 mg.  Last ov 06/13/12 Last date filled 10/14/12 #60 0 R. Has controlled substance contract on file No UDS available.  Please Advise AG cma

## 2012-11-15 NOTE — Telephone Encounter (Signed)
Ok to refill but pt will need UDS if not done

## 2012-11-18 NOTE — Telephone Encounter (Signed)
Spoke with patient and she said she already picked up the medication from the pharmacy. I will discard Rx at check in.       KP

## 2012-11-20 ENCOUNTER — Other Ambulatory Visit: Payer: Self-pay | Admitting: Family Medicine

## 2012-11-22 ENCOUNTER — Ambulatory Visit: Payer: BC Managed Care – PPO | Admitting: Gastroenterology

## 2012-11-25 ENCOUNTER — Encounter: Payer: BC Managed Care – PPO | Admitting: Adult Health

## 2012-12-06 ENCOUNTER — Encounter: Payer: Self-pay | Admitting: Family Medicine

## 2012-12-06 ENCOUNTER — Ambulatory Visit (INDEPENDENT_AMBULATORY_CARE_PROVIDER_SITE_OTHER): Payer: BC Managed Care – PPO | Admitting: Family Medicine

## 2012-12-06 ENCOUNTER — Ambulatory Visit (HOSPITAL_BASED_OUTPATIENT_CLINIC_OR_DEPARTMENT_OTHER)
Admission: RE | Admit: 2012-12-06 | Discharge: 2012-12-06 | Disposition: A | Discharge status: Home / Self Care | Payer: BC Managed Care – PPO | Source: Ambulatory Visit | Attending: Family Medicine | Admitting: Family Medicine

## 2012-12-06 VITALS — BP 104/64 | HR 104 | Temp 98.7°F | Wt 102.6 lb

## 2012-12-06 DIAGNOSIS — R0781 Pleurodynia: Secondary | ICD-10-CM

## 2012-12-06 DIAGNOSIS — R059 Cough, unspecified: Secondary | ICD-10-CM

## 2012-12-06 DIAGNOSIS — R079 Chest pain, unspecified: Secondary | ICD-10-CM | POA: Insufficient documentation

## 2012-12-06 DIAGNOSIS — R05 Cough: Secondary | ICD-10-CM

## 2012-12-06 DIAGNOSIS — J209 Acute bronchitis, unspecified: Secondary | ICD-10-CM

## 2012-12-06 DIAGNOSIS — G4700 Insomnia, unspecified: Secondary | ICD-10-CM

## 2012-12-06 MED ORDER — DOXAZOSIN MESYLATE 2 MG PO TABS: 2.0000 mg | ORAL_TABLET | Freq: Every day | ORAL | Status: DC

## 2012-12-06 MED ORDER — GUAIFENESIN-CODEINE 100-10 MG/5ML PO SYRP: 5.0000 mL | ORAL_SOLUTION | Freq: Three times a day (TID) | ORAL | Status: DC | PRN

## 2012-12-06 MED ORDER — TRAZODONE HCL 50 MG PO TABS: 100.0000 mg | ORAL_TABLET | Freq: Every day | ORAL | Status: DC

## 2012-12-06 MED ORDER — AZITHROMYCIN 250 MG PO TABS: ORAL_TABLET | ORAL | Status: DC

## 2012-12-06 NOTE — Patient Instructions (Signed)

## 2012-12-07 DIAGNOSIS — R0781 Pleurodynia: Secondary | ICD-10-CM | POA: Insufficient documentation

## 2012-12-07 DIAGNOSIS — J209 Acute bronchitis, unspecified: Secondary | ICD-10-CM | POA: Insufficient documentation
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
## 2012-12-07 NOTE — Assessment & Plan Note (Signed)
z pack Cough med cxr--- r/o pneumonia

## 2012-12-07 NOTE — Assessment & Plan Note (Signed)
Probably contusion Check xray

## 2012-12-09 ENCOUNTER — Other Ambulatory Visit: Payer: Self-pay | Admitting: Family Medicine

## 2012-12-10 ENCOUNTER — Encounter: Payer: BC Managed Care – PPO | Admitting: Adult Health

## 2012-12-10 NOTE — Telephone Encounter (Signed)
Last seen 12/06/12 and was prescribed guaiFENesin-codeine (ROBITUSSIN AC) 100-10 MG/5ML syrup. Please advise       KP

## 2012-12-13 ENCOUNTER — Ambulatory Visit: Payer: BC Managed Care – PPO | Admitting: Family Medicine

## 2012-12-13 DIAGNOSIS — Z0289 Encounter for other administrative examinations: Secondary | ICD-10-CM

## 2012-12-16 ENCOUNTER — Encounter: Payer: BC Managed Care – PPO | Admitting: Adult Health

## 2012-12-16 ENCOUNTER — Other Ambulatory Visit: Payer: Self-pay | Admitting: Family Medicine

## 2012-12-17 ENCOUNTER — Other Ambulatory Visit: Payer: Self-pay | Admitting: Family Medicine

## 2012-12-17 NOTE — Telephone Encounter (Signed)
Last seen 12/06/12 and filled 11/15/12 #60.  No UDS on file      KP

## 2012-12-17 NOTE — Telephone Encounter (Signed)
Duplicate request.    KP 

## 2012-12-19 ENCOUNTER — Encounter: Payer: Self-pay | Admitting: Adult Health

## 2012-12-19 ENCOUNTER — Other Ambulatory Visit: Payer: Self-pay | Admitting: Internal Medicine

## 2012-12-19 ENCOUNTER — Ambulatory Visit (INDEPENDENT_AMBULATORY_CARE_PROVIDER_SITE_OTHER)
Admission: RE | Admit: 2012-12-19 | Discharge: 2012-12-19 | Disposition: A | Discharge status: Home / Self Care | Payer: BC Managed Care – PPO | Source: Ambulatory Visit | Attending: Adult Health | Admitting: Adult Health

## 2012-12-19 ENCOUNTER — Ambulatory Visit (INDEPENDENT_AMBULATORY_CARE_PROVIDER_SITE_OTHER): Payer: BC Managed Care – PPO | Admitting: Adult Health

## 2012-12-19 VITALS — BP 98/70 | HR 116 | Temp 97.6°F | Ht 63.5 in | Wt 99.0 lb

## 2012-12-19 DIAGNOSIS — J4520 Mild intermittent asthma, uncomplicated: Secondary | ICD-10-CM

## 2012-12-19 DIAGNOSIS — J45909 Unspecified asthma, uncomplicated: Secondary | ICD-10-CM

## 2012-12-19 DIAGNOSIS — J189 Pneumonia, unspecified organism: Secondary | ICD-10-CM

## 2012-12-19 NOTE — Telephone Encounter (Signed)
Dr. Jarold Motto is prescribing MD

## 2012-12-19 NOTE — Patient Instructions (Addendum)
MUST QUIT SMOKING  Continue on Symbicort 2 puffs Twice daily  -brush /rinse and gargle after use.  I will call with xray results.  Follow med calendar closely and bring to each visit  follow up Dr. Sherene Sires  In 6 weeks and As needed

## 2012-12-23 NOTE — Progress Notes (Signed)
Subjective:     Patient ID: [name], female   DOB: [birth year]  MRN: [id number]  HPI 63 yowf active smoker no previous chronic resp problems until dx with AB 2006 and since then daily sob with tendency to overuse of saba    December 04, 2008 ov c/w 6 weeks indolent onset progressively worse sensation of wheezing and sob and chest tightness just on left side not much cough, some nausea assoc with hoarsness , worst first thing in am and when lies down. rec stop smoking and takeSymbicort 160/4.5 2 puffs first thing in am and 2 puffs again in pm about 12  r  January 04, 2009 ov cut down on smoking and liked symbicort much less need for saba     May 06, 2009--Presents for a 3 month follow up - states breathing was better when she was taking the dexilant rather than the nexium, symbicort has been working well. has prod cough with clear mucus, chest congestion, increased dyspnea, wheezing  . Wants to change back to dexilant.  . rec change to dexilant, short cycle of pred > better    08/29/12 ov/Wert to re-consult per Dr Lowne/actively smoking no ppi, maintained on breo but with freq use of saba at baseline Chief Complaint  Patient presents with  . Follow-up    Pt c/o wheezing and cough x 1 month. Cough is occ prod with minimal to moderate sputum-? color. She states feels heaviness in chest.    symptoms indolent onset and progessive x one month with sob x > slow adls and night/day cough and wheeze and chest tightness better p saba for only a few hours >>Stop Breo, cont on symbicort   12/19/12 Follow up and Med review  Returns for follow up and med review  We reviewed all his meds and organized them into a med calendar with pt education.  Was  recently dx'd with PNA by PCP. Tx with abx . Says she is feeling better.  Xray on 8/29 showed RML infiltrate.  She was tx with zpack .  No chest pain , orthopnea, edema or fever.    Current Medications, Allergies, Past Medical History, Past Surgical  History, Family History, and Social History were reviewed in [employer] record.  ROS  The following are not active complaints unless bolded sore throat, dysphagia, dental problems, itching, sneezing,  nasal congestion or excess/ purulent secretions, ear ache,   fever, chills, sweats, unintended wt loss, pleuritic or exertional cp, hemoptysis,  orthopnea pnd or leg swelling, presyncope, palpitations, heartburn, abdominal pain, anorexia, nausea, vomiting, diarrhea  or change in bowel or urinary habits, change in stools or urine, dysuria,hematuria,  rash, arthralgias, visual complaints, headache, numbness weakness or ataxia or problems with walking or coordination,  change in mood/affect or memory.              Objective:   Physical Exam    middle aged wf nad    HEENT: nl dentition, turbinates, and orophanx. Nl external ear canals without cough reflex   NECK :  without JVD/Nodes/TM/ nl carotid upstrokes bilaterally   LUNGS: no acc muscle use, no wheezing noted.   CV:  RRR  no s3 or murmur or increase in P2, no edema   ABD:  soft and nontender with nl excursion in the supine position. No bruits or organomegaly, bowel sounds nl  MS:  warm without deformities, calf tenderness, cyanosis or clubbing       CXR  08/29/2012 :  There is overall hyperinflation configuration. No consolidation or pleural effusion is evident. There is central peribronchial thickening.   Assessment:           Plan:

## 2012-12-23 NOTE — Assessment & Plan Note (Addendum)
Compensated on present regimen Encouraged on smoking cessation  Patient's medications were reviewed today and patient education was given. Computerized medication calendar was adjusted/completed Check CXR today for recent PNA dx--clinically improved    Plan  MUST QUIT SMOKING  Continue on Symbicort 2 puffs Twice daily  -brush /rinse and gargle after use.  I will call with xray results.  Follow med calendar closely and bring to each visit  follow up Dr. Sherene Sires  In 6 weeks and As needed

## 2012-12-26 ENCOUNTER — Encounter: Payer: Self-pay | Admitting: *Deleted

## 2012-12-26 NOTE — Progress Notes (Signed)
Quick Note:  Pt notified via MYCHART. ______ 

## 2012-12-31 ENCOUNTER — Ambulatory Visit: Payer: BC Managed Care – PPO | Admitting: Family Medicine

## 2012-12-31 DIAGNOSIS — Z0289 Encounter for other administrative examinations: Secondary | ICD-10-CM

## 2013-01-03 ENCOUNTER — Encounter: Payer: Self-pay | Admitting: Family Medicine

## 2013-01-03 ENCOUNTER — Ambulatory Visit (INDEPENDENT_AMBULATORY_CARE_PROVIDER_SITE_OTHER): Payer: BC Managed Care – PPO | Admitting: Family Medicine

## 2013-01-03 ENCOUNTER — Ambulatory Visit: Payer: BC Managed Care – PPO | Admitting: Family Medicine

## 2013-01-03 VITALS — BP 90/62 | HR 86 | Temp 98.1°F | Wt 103.4 lb

## 2013-01-03 DIAGNOSIS — J189 Pneumonia, unspecified organism: Secondary | ICD-10-CM

## 2013-01-03 DIAGNOSIS — G4700 Insomnia, unspecified: Secondary | ICD-10-CM

## 2013-01-03 MED ORDER — CEFTRIAXONE SODIUM 1 G IJ SOLR
1.0000 g | Freq: Once | INTRAMUSCULAR | Status: AC
  Administered 2013-01-03: 1 g via INTRAMUSCULAR

## 2013-01-03 MED ORDER — TEMAZEPAM 15 MG PO CAPS: 15.0000 mg | ORAL_CAPSULE | Freq: Every evening | ORAL | Status: DC | PRN

## 2013-01-03 MED ORDER — GUAIFENESIN-CODEINE 100-10 MG/5ML PO SYRP: ORAL_SOLUTION | ORAL | Status: DC

## 2013-01-03 MED ORDER — MOXIFLOXACIN HCL 400 MG PO TABS: 400.0000 mg | ORAL_TABLET | Freq: Every day | ORAL | Status: DC

## 2013-01-03 MED ORDER — OMEPRAZOLE 40 MG PO CPDR: DELAYED_RELEASE_CAPSULE | ORAL | Status: DC

## 2013-01-03 NOTE — Patient Instructions (Signed)

## 2013-01-05 NOTE — Progress Notes (Signed)
  Subjective:     [name] is a 37 y.o. female here for evaluation of a cough. Onset of symptoms was a few days ago. Symptoms have been gradually worsening since that time. The cough is productive and is aggravated by infection and reclining position. Associated symptoms include: chills, fever, postnasal drip, shortness of breath and wheezing. Patient does have a history of asthma. Patient does have a history of environmental allergens. Patient has not traveled recently. Patient does have a history of smoking. Patient has had a previous chest x-ray. Patient has not had a PPD done.  The following portions of the patient's history were reviewed and updated as appropriate: allergies, current medications, past family history, past medical history, past social history, past surgical history and problem list.  Review of Systems Pertinent items are noted in HPI.    Objective:    Oxygen saturation 97% on room air BP 90/62  Pulse 86  Temp(Src) 98.1 F (36.7 C) (Oral)  Wt 103 lb 6.4 oz (46.902 kg)  BMI 18.03 kg/m2  SpO2 97% General appearance: alert, cooperative, appears stated age and no distress Ears: normal TM's and external ear canals both ears Nose: Nares normal. Septum midline. Mucosa normal. No drainage or sinus tenderness. Throat: lips, mucosa, and tongue normal; teeth and gums normal Neck: no adenopathy, supple, symmetrical, trachea midline and thyroid not enlarged, symmetric, no tenderness/mass/nodules Lungs: rhonchi bilaterally and wheezes bilaterally Heart: S1, S2 normal    Assessment:    Acute Bronchitis  r/o pneumonia    Plan:    Antibiotics per medication orders. Antitussives per medication orders. Avoid exposure to tobacco smoke and fumes. B-agonist inhaler. Call if shortness of breath worsens, blood in sputum, change in character of cough, development of fever or chills, inability to maintain nutrition and hydration. Avoid exposure to tobacco smoke and fumes. Chest  x-ray. Steroid inhaler as ordered.

## 2013-01-06 ENCOUNTER — Telehealth: Payer: Self-pay | Admitting: General Practice

## 2013-01-06 MED ORDER — HYDROCOD POLST-CHLORPHEN POLST 10-8 MG/5ML PO LQCR: 5.0000 mL | Freq: Two times a day (BID) | ORAL | Status: DC | PRN

## 2013-01-06 NOTE — Telephone Encounter (Signed)
Pt called stating that the cough medicine that she was prescribed at OV is not working. Could hardly hear pt on triage line due to the cough. Please advise.

## 2013-01-06 NOTE — Telephone Encounter (Signed)
tussionex 6 oz 1 tsp po bid prn

## 2013-01-06 NOTE — Telephone Encounter (Signed)
Rx sent      KP 

## 2013-01-13 ENCOUNTER — Encounter: Payer: Self-pay | Admitting: Family Medicine

## 2013-01-13 ENCOUNTER — Ambulatory Visit (HOSPITAL_BASED_OUTPATIENT_CLINIC_OR_DEPARTMENT_OTHER)
Admission: RE | Admit: 2013-01-13 | Discharge: 2013-01-13 | Disposition: A | Discharge status: Home / Self Care | Payer: BC Managed Care – PPO | Source: Ambulatory Visit | Attending: Family Medicine | Admitting: Family Medicine

## 2013-01-13 ENCOUNTER — Other Ambulatory Visit: Payer: Self-pay | Admitting: Family Medicine

## 2013-01-13 ENCOUNTER — Ambulatory Visit (INDEPENDENT_AMBULATORY_CARE_PROVIDER_SITE_OTHER): Payer: BC Managed Care – PPO | Admitting: Family Medicine

## 2013-01-13 VITALS — BP 90/58 | HR 85 | Temp 97.5°F | Wt 100.0 lb

## 2013-01-13 DIAGNOSIS — R42 Dizziness and giddiness: Secondary | ICD-10-CM

## 2013-01-13 DIAGNOSIS — R5381 Other malaise: Secondary | ICD-10-CM

## 2013-01-13 DIAGNOSIS — M79602 Pain in left arm: Secondary | ICD-10-CM | POA: Insufficient documentation

## 2013-01-13 DIAGNOSIS — M79609 Pain in unspecified limb: Secondary | ICD-10-CM

## 2013-01-13 NOTE — Assessment & Plan Note (Signed)
Check labs Pt describes that she may be hyperventilating---but denies stress or anxiety.   She has had inc wheezing and sob-- but inhalers are helping F/u pulm

## 2013-01-13 NOTE — Progress Notes (Signed)
I atttempted to call pt at 630 pm with results of doppler.  No answer and no VM. Results were released on My chart

## 2013-01-13 NOTE — Progress Notes (Signed)
  Subjective:    Patient ID: [name], female    DOB: [birth year], 37 y.o.   MRN: [id number]  HPI Pt here c/o pain in L arm that con't from last visit.  She did not want to do anything then but the pain is worse.   Pt also c/o dizziness and inc wheezing.  She is using both inhalers and has a f/u with pulmonary this week.   Review of Systems As above    Objective:   Physical Exam BP 90/58  Pulse 85  Temp(Src) 97.5 F (36.4 C) (Oral)  Wt 100 lb (45.36 kg)  BMI 17.43 kg/m2  SpO2 96% General appearance: alert, cooperative, appears stated age and no distress Ears: normal TM's and external ear canals both ears Nose: Nares normal. Septum midline. Mucosa normal. No drainage or sinus tenderness. Throat: lips, mucosa, and tongue normal; teeth and gums normal Neck: no adenopathy, no carotid bruit, no JVD, supple, symmetrical, trachea midline and thyroid not enlarged, symmetric, no tenderness/mass/nodules Lungs: clear to auscultation bilaterally Heart: S1, S2 normal Neurologic: Alert and oriented X 3, normal strength and tone. Normal symmetric reflexes. Normal coordination and gait       Assessment & Plan:

## 2013-01-13 NOTE — Assessment & Plan Note (Signed)
+   ropiness and pain with palption--  R/o dvt Refer to sports med if neg

## 2013-01-14 ENCOUNTER — Telehealth: Payer: Self-pay | Admitting: Family Medicine

## 2013-01-14 LAB — BASIC METABOLIC PANEL
BUN: 4 mg/dL — ABNORMAL LOW (ref 6–23)
CO2: 24 mEq/L (ref 19–32)
Calcium: 8.6 mg/dL (ref 8.4–10.5)
Chloride: 109 mEq/L (ref 96–112)
Creatinine, Ser: 0.7 mg/dL (ref 0.4–1.2)
GFR: 100.23 mL/min (ref >60.00)
Glucose, Bld: 109 mg/dL — ABNORMAL HIGH (ref 70–99)
Potassium: 3.9 mEq/L (ref 3.5–5.1)
Sodium: 138 mEq/L (ref 135–145)

## 2013-01-14 LAB — CBC WITH DIFFERENTIAL/PLATELET
Basophils Absolute: 0 10*3/uL (ref 0.0–0.1)
Basophils Relative: 0.5 % (ref 0.0–3.0)
Eosinophils Absolute: 0.1 10*3/uL (ref 0.0–0.7)
Eosinophils Relative: 1 % (ref 0.0–5.0)
HCT: 35.8 % — ABNORMAL LOW (ref 36.0–46.0)
Hemoglobin: 12.1 g/dL (ref 12.0–15.0)
Lymphocytes Relative: 30.8 % (ref 12.0–46.0)
Lymphs Abs: 2.2 10*3/uL (ref 0.7–4.0)
MCHC: 33.8 g/dL (ref 30.0–36.0)
MCV: 93.1 fl (ref 78.0–100.0)
Monocytes Absolute: 0.1 10*3/uL (ref 0.1–1.0)
Monocytes Relative: 0.9 % — ABNORMAL LOW (ref 3.0–12.0)
Neutro Abs: 4.7 10*3/uL (ref 1.4–7.7)
Neutrophils Relative %: 66.8 % (ref 43.0–77.0)
Platelets: 234 10*3/uL (ref 150.0–400.0)
RBC: 3.85 Mil/uL — ABNORMAL LOW (ref 3.87–5.11)
RDW: 13.2 % (ref 11.5–14.6)
WBC: 7.1 10*3/uL (ref 4.5–10.5)

## 2013-01-14 LAB — VITAMIN B12: Vitamin B-12: 493 pg/mL (ref 211–911)

## 2013-01-14 LAB — TSH: TSH: 1.09 u[IU]/mL (ref 0.35–5.50)

## 2013-01-14 NOTE — Telephone Encounter (Signed)
Patient is calling in regards to lab results from 01/13/13. Please advise.

## 2013-01-15 ENCOUNTER — Other Ambulatory Visit: Payer: Self-pay | Admitting: Family Medicine

## 2013-01-15 ENCOUNTER — Telehealth: Payer: Self-pay | Admitting: Family Medicine

## 2013-01-15 NOTE — Telephone Encounter (Signed)
Last seen 01/13/13 and filled Tussionex on 01/06/13. Hydrocodone not filled. Please advise    KP

## 2013-01-15 NOTE — Telephone Encounter (Signed)
Last seen 01/13/13 and filled 12/16/12 #60. No UDS on file but contract has been signed     KP

## 2013-01-15 NOTE — Telephone Encounter (Signed)
Patient would like a medication refill for  HYDROcodone-acetaminophen (NORCO/VICODIN) 5-325 MG per tablet guaiFENesin-codeine (ROBITUSSIN AC) 100-10 MG/5ML syrup   thanks

## 2013-01-15 NOTE — Telephone Encounter (Signed)
Refill x1 

## 2013-01-15 NOTE — Telephone Encounter (Signed)
Results are not back, will notify patient when the results are in.    KP

## 2013-01-16 MED ORDER — HYDROCODONE-ACETAMINOPHEN 5-325 MG PO TABS: 1.0000 | ORAL_TABLET | Freq: Four times a day (QID) | ORAL | Status: DC | PRN

## 2013-01-16 MED ORDER — GUAIFENESIN-CODEINE 100-10 MG/5ML PO SYRP: ORAL_SOLUTION | ORAL | Status: DC

## 2013-01-16 NOTE — Telephone Encounter (Signed)
No ans--- Med's left at check in if patient decides to call in.     KP

## 2013-01-17 ENCOUNTER — Telehealth: Payer: Self-pay

## 2013-01-17 MED ORDER — HYDROCOD POLST-CHLORPHEN POLST 10-8 MG/5ML PO LQCR: 5.0000 mL | Freq: Two times a day (BID) | ORAL | Status: DC | PRN

## 2013-01-17 NOTE — Telephone Encounter (Signed)
Patient came in and requested the Tussionex and the Cheratussin. I made her aware that per Dr.Lowne she can not have both of them, the patient stated she wanted to have the Cheratussin instead. Rx printed and the patient bought in the Tussionex and also gave back the prescription for the Hydrocodone that was printed as well. Per Dr.Lowne ok to discard the med's.    KP

## 2013-01-29 ENCOUNTER — Other Ambulatory Visit: Payer: Self-pay | Admitting: Family Medicine

## 2013-01-31 ENCOUNTER — Ambulatory Visit (INDEPENDENT_AMBULATORY_CARE_PROVIDER_SITE_OTHER): Payer: BC Managed Care – PPO | Admitting: Internal Medicine

## 2013-01-31 ENCOUNTER — Encounter: Payer: Self-pay | Admitting: Internal Medicine

## 2013-01-31 ENCOUNTER — Telehealth: Payer: Self-pay | Admitting: Family Medicine

## 2013-01-31 VITALS — BP 100/70 | HR 88 | Temp 98.0°F | Ht 63.5 in | Wt 101.6 lb

## 2013-01-31 DIAGNOSIS — R079 Chest pain, unspecified: Secondary | ICD-10-CM

## 2013-01-31 DIAGNOSIS — F17200 Nicotine dependence, unspecified, uncomplicated: Secondary | ICD-10-CM

## 2013-01-31 DIAGNOSIS — J4520 Mild intermittent asthma, uncomplicated: Secondary | ICD-10-CM

## 2013-01-31 DIAGNOSIS — J189 Pneumonia, unspecified organism: Secondary | ICD-10-CM

## 2013-01-31 DIAGNOSIS — J45909 Unspecified asthma, uncomplicated: Secondary | ICD-10-CM

## 2013-01-31 MED ORDER — FAMOTIDINE 20 MG PO TABS: ORAL_TABLET | ORAL | Status: DC

## 2013-01-31 MED ORDER — HYDROCOD POLST-CHLORPHEN POLST 10-8 MG/5ML PO LQCR: 5.0000 mL | Freq: Two times a day (BID) | ORAL | Status: DC | PRN

## 2013-01-31 NOTE — Telephone Encounter (Signed)
Rx given to the patient.       KP 

## 2013-01-31 NOTE — Telephone Encounter (Signed)
Last seen 01/13/13 and filled 01/17/13 please advise      KP

## 2013-01-31 NOTE — Telephone Encounter (Signed)
Patient is calling to request a refill on her chlorpheniramine-HYDROcodone (TUSSIONEX) 10-8 MG/5ML LQCR. She states that she caught a cold from her son this week. Please advise.

## 2013-01-31 NOTE — Progress Notes (Signed)
Subjective:     Patient ID: [name], female   DOB: [birth year]  MRN: [id number]  Brief patient profile:  17 yowf active smoker no previous chronic resp problems until dx with AB 2006 and since then daily sob with tendency to overuse of saba despite absence of airflow obst on spirometry as recent as 08/26/2012   History of Present Illness  December 04, 2008 ov c/w 6 weeks indolent onset progressively worse sensation of wheezing and sob and chest tightness just on left side not much cough, some nausea assoc with hoarsness , worst first thing in am and when lies down. rec stop smoking and takeSymbicort 160/4.5 2 puffs first thing in am and 2 puffs again in pm about 12  r  January 04, 2009 ov cut down on smoking and liked symbicort much less need for saba     May 06, 2009--Presents for a 3 month follow up - states breathing was better when she was taking the dexilant rather than the nexium, symbicort has been working well. has prod cough with clear mucus, chest congestion, increased dyspnea, wheezing  . Wants to change back to dexilant.  . rec change to dexilant, short cycle of pred > better    08/29/12 ov/Obera Stauch to re-consult per Dr Lowne/actively smoking no ppi, maintained on breo but with freq use of saba at baseline Chief Complaint  Patient presents with  . Follow-up    Pt c/o wheezing and cough x 1 month. Cough is occ prod with minimal to moderate sputum-? color. She states feels heaviness in chest.    symptoms indolent onset and progessive x one month with sob x > slow adls and night/day cough and wheeze and chest tightness better p saba for only a few hours > Prilosec 40 mg Take 30-60 min before first meal of the day and add Pepcid ac 20 mg at bedtime Stop breo Chantix starter pack symbicort 160 Take 2 puffs first thing in am and then another 2 puffs about 12 hours later but work on technique  12/19/12 Follow up and Med review  Returns for follow up and med review  We reviewed all his  meds and organized them into a med calendar with pt education.  Was  recently dx'd with PNA by PCP. Tx with abx . Says she is feeling better.  Xray on 8/29 showed RML infiltrate.  She was tx with zpack  rec Stop smoking  Follow med calendar  01/31/2013 f/u ov/Rashod Gougeon re: cough > sob "no one gave me a med calendar" Chief Complaint  Patient presents with  . Follow-up    Pt states breathing has been worse for the past month- she states has had PNA x 2 this month. She also c/o increased cough- occ prod with yellow to green sputum.  Still smoking 1/2 ppd.   cough worst first thing in am and when lie down at night, min sputum vol, just finished abx.sob walking 50 ft. Confused with meds - did bring rescue with her but using it sev times a day and having problems with noct /early am cough   No obvious day to day or daytime variabilty or assoc   cp or chest tightness, subjective wheeze overt sinus or hb symptoms. No unusual exp hx or h/o childhood pna/ asthma or knowledge of premature birth.    Also denies any obvious fluctuation of symptoms with weather or environmental changes or other aggravating or alleviating factors except as outlined above   Current Medications,  Allergies, Complete Past Medical History, Past Surgical History, Family History, and Social History were reviewed in [employer] record.  ROS  The following are not active complaints unless bolded sore throat, dysphagia, dental problems, itching, sneezing,  nasal congestion or excess/ purulent secretions, ear ache,   fever, chills, sweats, unintended wt loss, pleuritic or exertional cp, hemoptysis,  orthopnea pnd or leg swelling, presyncope, palpitations, heartburn, abdominal pain, anorexia, nausea, vomiting, diarrhea  or change in bowel or urinary habits, change in stools or urine, dysuria,hematuria,  rash, arthralgias, visual complaints, headache, numbness weakness or ataxia or problems with walking or coordination,   change in mood/affect or memory.             Objective:   Physical Exam    middle aged wf nad   Wt Readings from Last 3 Encounters:  01/31/13 101 lb 9.6 oz (46.085 kg)  01/13/13 100 lb (45.36 kg)  01/03/13 103 lb 6.4 oz (46.902 kg)       HEENT: nl dentition, turbinates, and orophanx. Nl external ear canals without cough reflex   NECK :  without JVD/Nodes/TM/ nl carotid upstrokes bilaterally   LUNGS: no acc muscle use, min exp rhonchi bilaterally.   CV:  RRR  no s3 or murmur or increase in P2, no edema   ABD:  soft and nontender with nl excursion in the supine position. No bruits or organomegaly, bowel sounds nl  MS:  warm without deformities, calf tenderness, cyanosis or clubbing       cxr 12/26/12 Resolved right middle lobe infiltrate.  No acute cardiopulmonary disease.  Lung hyperexpansion suggests COPD.      Assessment:

## 2013-01-31 NOTE — Patient Instructions (Addendum)
Pepcid 20 mg at bedtime   Symbicort 160 Take 2 puffs first thing in am and then another 2 puffs about 12 hours later.    Only use your albuterol (ventolin or proaire) as a rescue medication to be used if you can't catch your breath by resting or doing a relaxed purse lip breathing pattern.  - The less you use it, the better it will work when you need it. - Ok to use up to every 4 hours if you must but call for immediate appointment if use goes up over your usual need - Don't leave home without it !!  (think of it like your spare tire for your car)   See calendar for specific medication instructions and bring it back for each and every office visit for every healthcare provider you see.  Without it,  you may not receive the best quality medical care that we feel you deserve.  You will note that the calendar groups together  your maintenance  medications that are timed at particular times of the day.  Think of this as your checklist for what your doctor has instructed you to do until your next evaluation to see what benefit  there is  to staying on a consistent group of medications intended to keep you well.  The other group at the bottom is entirely up to you to use as you see fit  for specific symptoms that may arise between visits that require you to treat them on an as needed basis.  Think of this as your action plan or "what if" list.   Separating the top medications from the bottom group is fundamental to providing you adequate care going forward.    Please schedule a follow up office visit in 4 weeks, sooner if needed for any respiratory to see Tammy with all medications and your med calendar in hand  Late add symbicort count near 0, 2 samples given Called Dr [name] w/in an hour of ov requesting Tussionex for a cold - will need to consider discharging her if not able to see some commitment on her part to follow instructions given before requesting narcotics for a same problem from a different  source

## 2013-01-31 NOTE — Telephone Encounter (Signed)
6 oz only

## 2013-02-02 DIAGNOSIS — J189 Pneumonia, unspecified organism: Secondary | ICD-10-CM | POA: Insufficient documentation

## 2013-02-02 NOTE — Assessment & Plan Note (Signed)
-   hfa 75% 01/31/13 p extensive coaching  - pft's s airflow obst 08/26/12 -50 ft sob reported 01/31/13 >  Walked RA x 3 laps rapid pace  @ 185 ft each stopped due to  End of study no desat  Symptoms are markedly disproportionate to objective findings and not clear this is all a lung problem but pt does appear to have difficult airway management issues.   DDX of  difficult airways managment all start with A and  include Adherence, Ace Inhibitors, Acid Reflux, Active Sinus Disease, Alpha 1 Antitripsin deficiency, Anxiety masquerading as Airways dz,  ABPA,  allergy(esp in young), Aspiration (esp in elderly), Adverse effects of DPI,  Active smokers, plus two Bs  = Bronchiectasis and Beta blocker use..and one C= CHF  - lost the last calendar we gave her and even p a new one was generated today reported could not find it before she left so was given a third one - The proper method of use, as well as anticipated side effects, of a metered-dose inhaler are discussed and demonstrated to the patient. Improved effectiveness after extensive coaching during this visit to a level of approximately  75%  Active smoking > discussed separately  ? Acid (or non-acid) GERD > always difficult to exclude as up to 75% of pts in some series report no assoc GI/ Heartburn symptoms> rec add hs h2   acid suppression and diet restrictions/ reviewed and instructions given in witting  ? Anxiety > usually a dx of exclusion but at the top of her list.    I had an extended discussion with the patient today lasting 15 to 20 minutes of a 25 minute visit on the following issues:   Each maintenance medication was reviewed in detail including most importantly the difference between maintenance and as needed and under what circumstances the prns are to be used. This was done in the context of generating a new updated medication calendar  which provided the patient with a user-friendly unambiguous mechanism for medication administration and  reconciliation and provides an action plan for all active problems. It is critical that this be shown to every doctor  for modification during the office visit if necessary so the patient can use it as a working document.

## 2013-02-02 NOTE — Assessment & Plan Note (Signed)
althogh this was her chief concern, all I see recently is a rml infiltrate from 11/2012 that has completely resolved so I would not pursue w/u x for perhaps a sinus CT if continues to report am discolored mucus (much more likely to be due to [facility] dysfunction from smoking though than from sinus dz)

## 2013-02-02 NOTE — Assessment & Plan Note (Signed)
>   3 m  I reviewed the Flethcher curve with patient that basically indicates  if you quit smoking when your best day FEV1 is still well preserved (as hers certainly is) it is highly unlikely you will progress to severe disease and informed the patient there was no medication on the market that has proven to change the curve or the likelihood of progression.  Therefore stopping smoking and maintaining abstinence is the most important aspect of care, not choice of inhalers or for that matter, doctors.

## 2013-02-03 ENCOUNTER — Telehealth: Payer: Self-pay | Admitting: Family Medicine

## 2013-02-03 NOTE — Telephone Encounter (Signed)
Patient called and wanted to see if we had another coupon for her SEROQUEL XR 400 MG 24 hr tablet because the one she had expired already.

## 2013-02-03 NOTE — Telephone Encounter (Signed)
Patient has been made aware that we did not have any coupons at this time. She has been advised to try online.    KP

## 2013-02-12 ENCOUNTER — Other Ambulatory Visit: Payer: Self-pay | Admitting: Family Medicine

## 2013-02-12 NOTE — Telephone Encounter (Signed)
Last seen 01/13/13 and filled 01/15/13 #60. Please advise      KP

## 2013-02-13 ENCOUNTER — Other Ambulatory Visit: Payer: Self-pay

## 2013-02-28 ENCOUNTER — Encounter: Payer: BC Managed Care – PPO | Admitting: Adult Health

## 2013-03-07 ENCOUNTER — Other Ambulatory Visit: Payer: Self-pay | Admitting: Family Medicine

## 2013-03-07 DIAGNOSIS — G4700 Insomnia, unspecified: Secondary | ICD-10-CM

## 2013-03-07 MED ORDER — TEMAZEPAM 15 MG PO CAPS: 15.0000 mg | ORAL_CAPSULE | Freq: Every evening | ORAL | Status: DC | PRN

## 2013-03-07 NOTE — Telephone Encounter (Signed)
Last seen 01/13/13 and filled 01/03/13 #30 with 1 refill.  Please advise        KP

## 2013-03-13 ENCOUNTER — Encounter: Payer: BC Managed Care – PPO | Admitting: Adult Health

## 2013-03-17 ENCOUNTER — Other Ambulatory Visit: Payer: Self-pay | Admitting: Family Medicine

## 2013-03-17 NOTE — Telephone Encounter (Signed)
Last seen 01/13/13 and filled 02/12/13 #60.      KP  NO UDS on file.

## 2013-03-17 NOTE — Telephone Encounter (Signed)
Patient aware Rx ready for pick up in the office... KP 

## 2013-03-28 ENCOUNTER — Other Ambulatory Visit: Payer: Self-pay | Admitting: Family Medicine

## 2013-03-28 NOTE — Telephone Encounter (Signed)
Last seen 01/13/13 and filled 12/06/12 #60 with 3 refills. Please advise     KP

## 2013-04-02 ENCOUNTER — Telehealth: Payer: Self-pay | Admitting: *Deleted

## 2013-04-02 NOTE — Telephone Encounter (Signed)
UDS on 03/19/13 deemed high risk by provider. Patient;s UDS was negative for Sertraline, per Dr. [name] she will not longer prescribe Klonopin as well.

## 2013-04-18 ENCOUNTER — Other Ambulatory Visit: Payer: Self-pay | Admitting: Family Medicine

## 2013-04-19 ENCOUNTER — Other Ambulatory Visit: Payer: Self-pay | Admitting: Family Medicine

## 2013-04-21 ENCOUNTER — Telehealth: Payer: Self-pay | Admitting: Family Medicine

## 2013-04-21 NOTE — Telephone Encounter (Signed)
I tried calling the patient but the line rings and after the 5th rings goes busy.      [name]

## 2013-04-21 NOTE — Telephone Encounter (Signed)
Chilton Greathouse, RN at 04/02/2013 8:58 AM     Status: Signed        UDS on 03/19/13 deemed high risk by provider. Patient;s UDS was negative for Sertraline, per Dr. Etter Sjogren she will not longer prescribe Klonopin as well.

## 2013-04-21 NOTE — Telephone Encounter (Signed)
Lindsay Look,  Do you have the UDS so I can see it?

## 2013-04-21 NOTE — Telephone Encounter (Signed)
i'm happy to write zoloft but with writing the klonopin regularly and uds neg -- we can not write that any more We can give her name and number psych

## 2013-04-21 NOTE — Telephone Encounter (Signed)
Spoke with patient and she stated she was off of the Zoloft for 2 weeks and then she told me she was off of it for 1 month, she stated she has been back on it now for 2 weeks. She was not taking it at the time of the UDS she did not tell anyone she was not taking her medication when she came in. Please advise      KP

## 2013-04-21 NOTE — Telephone Encounter (Signed)
The patient was negative for Zoloft, Klonopin was positive and she was also positive for another drug. Not sure which one because the screen was sent to be scanned. Katharine Look may know. Please advise        KP

## 2013-04-21 NOTE — Telephone Encounter (Signed)
Patient is calling to see why she cannot have her Clonazepam refilled. Please advise.

## 2013-04-22 MED ORDER — CLONAZEPAM 1 MG PO TABS: ORAL_TABLET | ORAL | Status: DC

## 2013-04-22 NOTE — Telephone Encounter (Signed)
UDS printed and placed on ledge for you to review.

## 2013-04-22 NOTE — Telephone Encounter (Signed)
Ok to renew zoloft and klonopin--- zoloft for 6 months and klonopin 1 month

## 2013-04-22 NOTE — Telephone Encounter (Signed)
Report pulled from [facility] and left on the ledge.      KP

## 2013-04-22 NOTE — Telephone Encounter (Signed)
Per provider Rx sent.    KP

## 2013-04-26 ENCOUNTER — Other Ambulatory Visit: Payer: Self-pay | Admitting: Family Medicine

## 2013-04-28 ENCOUNTER — Other Ambulatory Visit: Payer: Self-pay | Admitting: Family Medicine

## 2013-04-28 NOTE — Telephone Encounter (Signed)
Last seen 01/13/13 and filled 03/28/13 #60.  UDS done but not scanned in the chart Patient was High Risk   Please advise     KP

## 2013-04-28 NOTE — Telephone Encounter (Signed)
OK #60 

## 2013-05-13 ENCOUNTER — Other Ambulatory Visit: Payer: Self-pay

## 2013-05-13 DIAGNOSIS — G4700 Insomnia, unspecified: Secondary | ICD-10-CM

## 2013-05-13 MED ORDER — TEMAZEPAM 15 MG PO CAPS: 15.0000 mg | ORAL_CAPSULE | Freq: Every evening | ORAL | Status: DC | PRN

## 2013-05-13 NOTE — Telephone Encounter (Signed)
Last seen 03/07/13 #30 with 1 refill. Please advise    KP

## 2013-05-22 ENCOUNTER — Other Ambulatory Visit: Payer: Self-pay | Admitting: Family Medicine

## 2013-05-22 ENCOUNTER — Encounter: Payer: Self-pay | Admitting: Family Medicine

## 2013-05-22 NOTE — Telephone Encounter (Signed)
Last seen 01/13/13 and filled 04/22/13 #60. Please advise     KP

## 2013-05-29 ENCOUNTER — Telehealth: Payer: Self-pay | Admitting: Family Medicine

## 2013-05-29 ENCOUNTER — Other Ambulatory Visit: Payer: Self-pay | Admitting: *Deleted

## 2013-05-29 MED ORDER — TRAZODONE HCL 50 MG PO TABS: ORAL_TABLET | ORAL | Status: DC

## 2013-05-29 NOTE — Telephone Encounter (Signed)
This is Dr. Nonda Lou pt.//AB/CMA

## 2013-05-29 NOTE — Telephone Encounter (Signed)
PATIENT CALLED REQUESTING RX FOR TRAZADONE. SHE STATES SHE CONTACTED HER PHARMACY A FEW DAYS AGO. PT USES [facility] IN [place]

## 2013-05-29 NOTE — Telephone Encounter (Signed)
Refill x1 

## 2013-05-29 NOTE — Telephone Encounter (Signed)
Last seen 01/13/13 and filled 04/26/13 #60. Please advise     KP

## 2013-05-29 NOTE — Telephone Encounter (Signed)
Faxed.   KP 

## 2013-06-15 ENCOUNTER — Emergency Department (HOSPITAL_COMMUNITY)
Admission: EM | Admit: 2013-06-15 | Discharge: 2013-06-15 | Disposition: A | Discharge status: Home / Self Care | Payer: BC Managed Care – PPO | Attending: Emergency Medicine | Admitting: Emergency Medicine

## 2013-06-15 ENCOUNTER — Encounter (HOSPITAL_COMMUNITY): Payer: Self-pay | Admitting: Emergency Medicine

## 2013-06-15 ENCOUNTER — Emergency Department (HOSPITAL_COMMUNITY): Payer: BC Managed Care – PPO

## 2013-06-15 DIAGNOSIS — G8929 Other chronic pain: Secondary | ICD-10-CM | POA: Insufficient documentation

## 2013-06-15 DIAGNOSIS — M545 Low back pain, unspecified: Secondary | ICD-10-CM

## 2013-06-15 DIAGNOSIS — S0990XA Unspecified injury of head, initial encounter: Secondary | ICD-10-CM | POA: Insufficient documentation

## 2013-06-15 DIAGNOSIS — R269 Unspecified abnormalities of gait and mobility: Secondary | ICD-10-CM | POA: Insufficient documentation

## 2013-06-15 DIAGNOSIS — M129 Arthropathy, unspecified: Secondary | ICD-10-CM | POA: Insufficient documentation

## 2013-06-15 DIAGNOSIS — Y92009 Unspecified place in unspecified non-institutional (private) residence as the place of occurrence of the external cause: Secondary | ICD-10-CM | POA: Insufficient documentation

## 2013-06-15 DIAGNOSIS — Z8541 Personal history of malignant neoplasm of cervix uteri: Secondary | ICD-10-CM | POA: Insufficient documentation

## 2013-06-15 DIAGNOSIS — Z862 Personal history of diseases of the blood and blood-forming organs and certain disorders involving the immune mechanism: Secondary | ICD-10-CM | POA: Insufficient documentation

## 2013-06-15 DIAGNOSIS — J4489 Other specified chronic obstructive pulmonary disease: Secondary | ICD-10-CM | POA: Insufficient documentation

## 2013-06-15 DIAGNOSIS — F319 Bipolar disorder, unspecified: Secondary | ICD-10-CM | POA: Insufficient documentation

## 2013-06-15 DIAGNOSIS — F329 Major depressive disorder, single episode, unspecified: Secondary | ICD-10-CM | POA: Insufficient documentation

## 2013-06-15 DIAGNOSIS — Z860100 Personal history of colon polyps, unspecified: Secondary | ICD-10-CM | POA: Insufficient documentation

## 2013-06-15 DIAGNOSIS — IMO0002 Reserved for concepts with insufficient information to code with codable children: Secondary | ICD-10-CM | POA: Insufficient documentation

## 2013-06-15 DIAGNOSIS — G40909 Epilepsy, unspecified, not intractable, without status epilepticus: Secondary | ICD-10-CM | POA: Insufficient documentation

## 2013-06-15 DIAGNOSIS — F411 Generalized anxiety disorder: Secondary | ICD-10-CM | POA: Insufficient documentation

## 2013-06-15 DIAGNOSIS — Z79899 Other long term (current) drug therapy: Secondary | ICD-10-CM | POA: Insufficient documentation

## 2013-06-15 DIAGNOSIS — K219 Gastro-esophageal reflux disease without esophagitis: Secondary | ICD-10-CM | POA: Insufficient documentation

## 2013-06-15 DIAGNOSIS — Y9389 Activity, other specified: Secondary | ICD-10-CM | POA: Insufficient documentation

## 2013-06-15 DIAGNOSIS — J449 Chronic obstructive pulmonary disease, unspecified: Secondary | ICD-10-CM | POA: Insufficient documentation

## 2013-06-15 DIAGNOSIS — F3289 Other specified depressive episodes: Secondary | ICD-10-CM | POA: Insufficient documentation

## 2013-06-15 DIAGNOSIS — I10 Essential (primary) hypertension: Secondary | ICD-10-CM | POA: Insufficient documentation

## 2013-06-15 DIAGNOSIS — G43909 Migraine, unspecified, not intractable, without status migrainosus: Secondary | ICD-10-CM | POA: Insufficient documentation

## 2013-06-15 DIAGNOSIS — F17200 Nicotine dependence, unspecified, uncomplicated: Secondary | ICD-10-CM | POA: Insufficient documentation

## 2013-06-15 DIAGNOSIS — W19XXXA Unspecified fall, initial encounter: Secondary | ICD-10-CM

## 2013-06-15 DIAGNOSIS — Z9889 Other specified postprocedural states: Secondary | ICD-10-CM | POA: Insufficient documentation

## 2013-06-15 DIAGNOSIS — Z87442 Personal history of urinary calculi: Secondary | ICD-10-CM | POA: Insufficient documentation

## 2013-06-15 DIAGNOSIS — F3130 Bipolar disorder, current episode depressed, mild or moderate severity, unspecified: Secondary | ICD-10-CM | POA: Insufficient documentation

## 2013-06-15 DIAGNOSIS — Z87448 Personal history of other diseases of urinary system: Secondary | ICD-10-CM | POA: Insufficient documentation

## 2013-06-15 DIAGNOSIS — W010XXA Fall on same level from slipping, tripping and stumbling without subsequent striking against object, initial encounter: Secondary | ICD-10-CM | POA: Insufficient documentation

## 2013-06-15 DIAGNOSIS — D649 Anemia, unspecified: Secondary | ICD-10-CM | POA: Insufficient documentation

## 2013-06-15 DIAGNOSIS — Z8639 Personal history of other endocrine, nutritional and metabolic disease: Secondary | ICD-10-CM | POA: Insufficient documentation

## 2013-06-15 DIAGNOSIS — Z8742 Personal history of other diseases of the female genital tract: Secondary | ICD-10-CM | POA: Insufficient documentation

## 2013-06-15 DIAGNOSIS — Z950 Presence of cardiac pacemaker: Secondary | ICD-10-CM | POA: Insufficient documentation

## 2013-06-15 MED ORDER — HYDROMORPHONE HCL PF 1 MG/ML IJ SOLN
1.0000 mg | Freq: Once | INTRAMUSCULAR | Status: AC
  Administered 2013-06-15: 1 mg via INTRAMUSCULAR
  Filled 2013-06-15: qty 1

## 2013-06-15 MED ORDER — DIAZEPAM 5 MG PO TABS: 5.0000 mg | ORAL_TABLET | Freq: Two times a day (BID) | ORAL | Status: DC

## 2013-06-15 NOTE — ED Provider Notes (Signed)
CSN: [id number]     Arrival date & time 06/15/13  2010 History   First MD Initiated Contact with Patient 06/15/13 2107     Chief Complaint  Patient presents with  . Back Pain     (Consider location/radiation/quality/duration/timing/severity/associated sxs/prior Treatment) HPI Pt is a 38yo female with hx of chronic interstitial cystitis, chronic back pain, and fibromyalgia c/o lower back pain after fall earlier tonight at home. Pt states she was riding on the back of her son when he slipped, fell backward and on top of her 4 hours PTA. Pt c/o severe pain in tailbone and lower back. Reports hitting head but denies LOC, states all of her pain is in her lower back, aching, sore, 10/10, worse with movement.  Pt is concerned because she has a bladder stimulator in place and is most sore over that area.  Reports taking her pain medication, hydrocodone at home w/o relief. Denies numbness or tingling in arms or legs. Denies change in bowel or bladder habits.    Past Medical History  Diagnosis Date  . 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 SLEEP APNEA 08/31/2007  . 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 DEPRESSION 08/31/2007  . ARTHRITIS 08/31/2007  . HYPERTENSION 08/31/2007  . Chronic interstitial cystitis 03/11/2009  . IBS 03/11/2009  . GERD 02/05/2009  . BRONCHITIS, RECURRENT 08/23/2009    Asthmatic Bronchitis-Dr. Melvyn Novas.....-HFA 75% 12/04/2008>75% 02/05/2009>75% 08/04/2009 -PFT's 01/04/2009 2.56 (86%) ratio 75, no resp to B2 and DLC0 67% > 80 after correction   . Anal fissure 03/11/2009  . RECTAL BLEEDING 03/11/2009  . COLONIC POLYPS, HX OF 07/25/2006    ADENOMATOUS POLYP  . Migraine headache   . Chronic pain   . Chronic nausea   . COPD (chronic obstructive pulmonary disease)   . Asthma   . Anxiety and depression   . Uterine cyst   . Endometriosis   . Bowel obstruction   . Colon polyps   . Internal hemorrhoids   . Cancer     cervical cancer  . FIBROMYALGIA  08/31/2007  . Arthritis   . Anemia   . Anxiety   . Hyperlipidemia   . Seizures     been about 1 year since last seisure per pt  . Bipolar 1 disorder    Past Surgical History  Procedure Laterality Date  . Cholecystectomy    . Abdominal hysterectomy    . Pacemaker insertion      in hip for interstitial cystitis  . Bladder surgery      stimulator placed and stretching   . Interstitial cystitis    . Bladder stretching x6    . Replaced bladder pacemaker    . Removal of uterine cyst and scrapped uterus    . Colonoscopy     Family History  Problem Relation Age of Onset  . Heart disease Father   . Asthma Maternal Grandmother   . Emphysema Maternal Grandfather   . Cancer Maternal Grandfather     Lung Cancer  . Cancer Other     Lung Cancer-Aunt  . Colon cancer Neg Hx   . Esophageal cancer Neg Hx   . Rectal cancer Neg Hx   . Stomach cancer Neg Hx    History  Substance Use Topics  . Smoking status: Current Every Day Smoker -- 1.00 packs/day for 14 years    Types: Cigarettes  . Smokeless tobacco: Never Used  . Alcohol Use: Yes     Comment: occasional wine  OB History   Grav Para Term Preterm Abortions TAB SAB Ect Mult Living                 Review of Systems  Respiratory: Negative for shortness of breath.   Cardiovascular: Negative for chest pain.  Musculoskeletal: Positive for back pain and myalgias.  Skin: Negative for wound.  Neurological: Negative for dizziness, weakness, light-headedness, numbness and headaches.  All other systems reviewed and are negative.      Allergies  Abilify; Metoclopramide hcl; Tramadol; Ambien; Eszopiclone; Amitriptyline; Demerol; Metoclopramide; Morphine and related; and Propoxyphene n-acetaminophen  Home Medications   Current Outpatient Rx  Name  Route  Sig  Dispense  Refill  . albuterol (VENTOLIN HFA) 108 (90 BASE) MCG/ACT inhaler      2 puffs every 4-6 hours as needed for wheezing/shortness of breath         .  budesonide-formoterol (SYMBICORT) 160-4.5 MCG/ACT inhaler   Inhalation   Inhale 2 puffs into the lungs 2 (two) times daily.   1 Inhaler   0   . CALCIUM PO   Oral   Take 1 tablet by mouth 2 (two) times daily.         . cetirizine (ZYRTEC) 10 MG tablet   Oral   Take 10 mg by mouth at bedtime as needed for allergies.         . Cholecalciferol (VITAMIN D) 2000 UNITS CAPS   Oral   Take 1 capsule by mouth daily.         . clonazePAM (KLONOPIN) 1 MG tablet      TAKE ONE TABLET BY MOUTH TWICE DAILY   60 tablet   0   . colesevelam (WELCHOL) 625 MG tablet      Take 3 tablets (1,875 mg total) by mouth 2 times daily with meals.         . doxazosin (CARDURA) 2 MG tablet      TAKE ONE TABLET BY MOUTH AT BEDTIME   30 tablet   0   . estradiol (ESTRACE) 1 MG tablet   Oral   Take 2 mg by mouth daily.         . folic acid (FOLVITE) 1 MG tablet   Oral   Take 1 mg by mouth every morning.          Marland Kitchen HYDROcodone-acetaminophen (NORCO/VICODIN) 5-325 MG per tablet   Oral   Take 1 tablet by mouth every 6 (six) hours as needed for pain.   30 tablet   0   . omeprazole (PRILOSEC) 40 MG capsule      TAKE ONE CAPSULE BY MOUTH ONCE DAILY   30 capsule   11   . SEROQUEL XR 400 MG 24 hr tablet      TAKE ONE TABLET BY MOUTH AT BEDTIME   30 tablet   5   . sertraline (ZOLOFT) 100 MG tablet      TAKE ONE TABLET BY MOUTH ONCE DAILY   30 tablet   5   . traZODone (DESYREL) 50 MG tablet      TAKE TWO TABLETS BY MOUTH AT BEDTIME   60 tablet   0   . diazepam (VALIUM) 5 MG tablet   Oral   Take 1 tablet (5 mg total) by mouth 2 (two) times daily.   8 tablet   0    BP 110/71  Pulse 76  Temp(Src) 97.9 F (36.6 C) (Oral)  Resp 18  Ht 5\' 4"  (1.626  m)  Wt 109 lb (49.442 kg)  BMI 18.70 kg/m2  SpO2 95% Physical Exam  Nursing note and vitals reviewed. Constitutional: She appears well-developed and well-nourished.  Pt lying on stomach on exam bed, appears uncomfortable.   HENT:  Head: Normocephalic and atraumatic.  Eyes: Conjunctivae are normal. No scleral icterus.  Neck: Normal range of motion. Neck supple.  No midline bone tenderness, no crepitus or step-offs.   Cardiovascular: Normal rate, regular rhythm and normal heart sounds.   Pulmonary/Chest: Effort normal and breath sounds normal. No respiratory distress. She has no wheezes. She has no rales. She exhibits no tenderness.  Abdominal: Soft. Bowel sounds are normal. She exhibits no distension and no mass. There is no tenderness. There is no rebound and no guarding.  Musculoskeletal: Normal range of motion. She exhibits tenderness. She exhibits no edema.  Well healed surgical scar in left lower lumbar region. No ecchymosis or erythema. Tenderness over stimulation device and surrounding musculature. Tenderness over lower lumbar spine and buttock.  No step-offs or crepitus.  Antalgic gait.  Neurological: She is alert.  Skin: Skin is warm and dry.    ED Course  Procedures (including critical care time) Labs Review Labs Reviewed - No data to display Imaging Review Dg Lumbar Spine Complete  06/15/2013   CLINICAL DATA:  Back pain after fall  EXAM: LUMBAR SPINE - COMPLETE 4+ VIEW  COMPARISON:  None.  FINDINGS: There is no evidence of lumbar spine fracture. Alignment is normal. Intervertebral disc spaces are maintained. Posterior facet joints appear normal. Sacral stimulator device is noted.  IMPRESSION: No significant abnormality seen in the lumbar spine.   Electronically Signed   By: Sabino Dick M.D.   On: 06/15/2013 23:05   Dg Sacrum/coccyx  06/15/2013   CLINICAL DATA:  Back pain after fall.  EXAM: SACRUM AND COCCYX - 2+ VIEW  COMPARISON:  None.  FINDINGS: There is no evidence of fracture or other focal bone lesions. Sacroiliac and hip joints appear normal bilaterally. Stimulator lead is seen entering right side of sacrum.  IMPRESSION: No acute abnormality seen in the sacrum or coccyx.   Electronically Signed    By: Sabino Dick M.D.   On: 06/15/2013 23:09     EKG Interpretation None      MDM   Final diagnoses:  Fall at home  Low back pain    Pt c/o lower back pain after fall at home.  Able to move all 4 extremities. Does have antalgic gait and tender long lower lumbar spine and musculature, more specifically over area of bladder stimulator.  No red flag symptoms. Discussed pt with Dr. Doy Mince, will get plain films.  Dilaudid IM given in ED. Pt sleeping comfortably in exam bed while waiting for imaging to be read.  Plain films: no significant abnormality seen in lumbar spine, sacrum or coccyx.  Will discharge pt home with valium (8 tabs) for muscle spasms. Advised to take home pain medication as needed and f/u with her PCP. Return precautions provided. Pt verbalized understanding and agreement with tx plan.     Noland Fordyce, PA-C 06/16/13 0104

## 2013-06-15 NOTE — ED Notes (Signed)
Pt says her son was carrying her on his back and she was dropped on the hardwood floor and his weight came down on her; pt is feeling pain on her tailbone area and towards the left lower side of her back at this time

## 2013-06-15 NOTE — Discharge Instructions (Signed)
Take valium every 12 hours as needed for severe back pain and spasms. You may also continued to take you hydrocodone as needed and as prescribed for back pain.  Be sure to use ice for 43min at a time 3-4 times daily.  Follow up with your primary care provider for continued pain.  Return to ER for worsening symptoms.

## 2013-06-15 NOTE — ED Notes (Signed)
Pt arrived to the ED with a complaint of lower back pain.  Pt was riding on the back of a family member when that family member slipped and fell backwards falling on the pt.  Pt states her tailbone is the source of the pain.  Pt has a bladder stimulator in place and the fall area is close to its location on the lower right hand side of her back.

## 2013-06-16 NOTE — ED Provider Notes (Signed)
Medical screening examination/treatment/procedure(s) were performed by non-physician practitioner and as supervising physician I was immediately available for consultation/collaboration.   EKG Interpretation None        Houston Siren III, MD 06/16/13 1327

## 2013-06-19 ENCOUNTER — Ambulatory Visit (INDEPENDENT_AMBULATORY_CARE_PROVIDER_SITE_OTHER): Payer: BC Managed Care – PPO | Admitting: Family Medicine

## 2013-06-19 ENCOUNTER — Encounter: Payer: Self-pay | Admitting: Family Medicine

## 2013-06-19 ENCOUNTER — Other Ambulatory Visit: Payer: Self-pay | Admitting: Family Medicine

## 2013-06-19 VITALS — BP 92/64 | HR 86 | Temp 98.0°F | Wt 110.0 lb

## 2013-06-19 DIAGNOSIS — F319 Bipolar disorder, unspecified: Secondary | ICD-10-CM

## 2013-06-19 MED ORDER — QUETIAPINE FUMARATE 300 MG PO TABS: 300.0000 mg | ORAL_TABLET | Freq: Two times a day (BID) | ORAL | Status: DC

## 2013-06-19 NOTE — Progress Notes (Signed)
Pre visit review using our clinic review tool, if applicable. No additional management support is needed unless otherwise documented below in the visit note. 

## 2013-06-19 NOTE — Progress Notes (Signed)
Patient ID: [name], female   DOB: 25-Aug-1975, 38 y.o.   MRN: [id number]   Subjective:    Patient ID: [name], female    DOB: Dec 07, 1975, 38 y.o.   MRN: [id number] HPI Pt here f/u bipolar.  She has bee unable to get in to see psych---she says its because she is a new pt and has to wait.  Pt is very figety and teary eyed.  She will not go to hospital.  Pt states she is not suicidal.           Objective:    BP 92/64  Pulse 86  Temp(Src) 98 F (36.7 C) (Oral)  Wt 110 lb (49.896 kg)  SpO2 97% General appearance: alert, cooperative, appears stated age and no distress Lungs: clear to auscultation bilaterally Heart: regular rate and rhythm, S1, S2 normal, no murmur, click, rub or gallop Neurologic: Alert and oriented X 3, normal strength and tone. Normal symmetric reflexes. Normal coordination and gait Psych-- teary eyed,  Can't sit still,  Not suicidal           Assessment & Plan:  1. Bipolar disorder, unspecified Psych referral==== pt will call for appointment - QUEtiapine (SEROQUEL) 300 MG tablet; Take 1 tablet (300 mg total) by mouth 2 (two) times daily.  Dispense: 60 tablet; Refill: 2 con't other meds rto prn

## 2013-06-19 NOTE — Patient Instructions (Signed)
Bipolar Disorder °Bipolar disorder is a mental illness. The term bipolar disorder actually is used to describe a group of disorders that all share varying degrees of emotional highs and lows that can interfere with daily functioning, such as work, school, or relationships. Bipolar disorder also can lead to drug abuse, hospitalization, and suicide. °The emotional highs of bipolar disorder are periods of elation or irritability and high energy. These highs can range from a mild form (hypomania) to a severe form (mania). People experiencing episodes of hypomania may appear energetic, excitable, and highly productive. People experiencing mania may behave impulsively or erratically. They often make poor decisions. They may have difficulty sleeping. The most severe episodes of mania can involve having very distorted beliefs or perceptions about the world and seeing or hearing things that are not real (psychotic delusions and hallucinations).  °The emotional lows of bipolar disorder (depression) also can range from mild to severe. Severe episodes of bipolar depression can involve psychotic delusions and hallucinations. °Sometimes people with bipolar disorder experience a state of mixed mood. Symptoms of hypomania or mania and depression are both present during this mixed-mood episode. °SIGNS AND SYMPTOMS °There are signs and symptoms of the episodes of hypomania and mania as well as the episodes of depression. The signs and symptoms of hypomania and mania are similar but vary in severity. They include: °· Inflated self-esteem or feeling of increased self-confidence. °· Decreased need for sleep. °· Unusual talkativeness (rapid or pressured speech) or the feeling of a need to keep talking. °· Sensation of racing thoughts or constant talking, with quick shifts between topics that may or may not be related (flight of ideas). °· Decreased ability to focus or concentrate. °· Increased purposeful activity, such as work, studies,  or social activity, or nonproductive activity, such as pacing, squirming and fidgeting, or finger and toe tapping. °· Impulsive behavior and use of poor judgment, resulting in high-risk activities, such as having unprotected sex or spending excessive amounts of money. °Signs and symptoms of depression include the following:  °· Feelings of sadness, hopelessness, or helplessness. °· Frequent or uncontrollable episodes of crying. °· Lack of feeling anything or caring about anything. °· Difficulty sleeping or sleeping too much.  °· Inability to enjoy the things you used to enjoy.   °· Desire to be alone all the time.   °· Feelings of guilt or worthlessness.  °· Lack of energy or motivation.   °· Difficulty concentrating, remembering, or making decisions.  °· Change in appetite or weight beyond normal fluctuations. °· Thoughts of death or the desire to harm yourself. °DIAGNOSIS  °Bipolar disorder is diagnosed through an assessment by your caregiver. Your caregiver will ask questions about your emotional episodes. There are two main types of bipolar disorder. People with type I bipolar disorder have manic episodes with or without depressive episodes. People with type II bipolar disorder have hypomanic episodes and major depressive episodes, which are more serious than mild depression. The type of bipolar disorder you have can make an important difference in how your illness is monitored and treated. °Your caregiver may ask questions about your medical history and use of alcohol or drugs, including prescription medication. Certain medical conditions and substances also can cause emotional highs and lows that resemble bipolar disorder (secondary bipolar disorder).  °TREATMENT  °Bipolar disorder is a long-term illness. It is best controlled with continuous treatment rather than treatment only when symptoms occur. The following treatments can be prescribed for bipolar disorders: °· Medication Medication can be prescribed by  a doctor   that is an expert in treating mental disorders (psychiatrists). Medications called mood stabilizers are usually prescribed to help control the illness. Other medications are sometimes added if symptoms of mania, depression, or psychotic delusions and hallucinations occur despite the use of a mood stabilizer. °· Talk therapy Some forms of talk therapy are helpful in providing support, education, and guidance. °A combination of medication and talk therapy is best for managing the disorder over time. A procedure in which electricity is applied to your brain through your scalp (electroconvulsive therapy) is used in cases of severe mania when medication and talk therapy do not work or work too slowly. °Document Released: 07/03/2000 Document Revised: 07/22/2012 Document Reviewed: 04/22/2012 °[employer]® Patient Information ©2014 [employer]. ° °

## 2013-06-20 ENCOUNTER — Telehealth: Payer: Self-pay | Admitting: Family Medicine

## 2013-06-20 ENCOUNTER — Other Ambulatory Visit: Payer: Self-pay | Admitting: Family Medicine

## 2013-06-20 NOTE — Telephone Encounter (Signed)
Last seen 06/19/13 and filled 05/22/13 #60. Please advise     KP

## 2013-06-20 NOTE — Telephone Encounter (Signed)
Relevant patient education assigned to patient using Emmi. ° °

## 2013-06-23 ENCOUNTER — Other Ambulatory Visit: Payer: Self-pay | Admitting: Family Medicine

## 2013-06-23 NOTE — Telephone Encounter (Signed)
Last seen 06/19/13 and filled 05/29/13 #60. Please advise     KP

## 2013-07-04 ENCOUNTER — Other Ambulatory Visit: Payer: Self-pay | Admitting: Family Medicine

## 2013-07-04 NOTE — Telephone Encounter (Signed)
Last seen 06/19/13 and filled 05/22/13 #60. Please advise      KP

## 2013-07-09 ENCOUNTER — Telehealth: Payer: Self-pay

## 2013-07-09 DIAGNOSIS — G4700 Insomnia, unspecified: Secondary | ICD-10-CM

## 2013-07-09 MED ORDER — TEMAZEPAM 15 MG PO CAPS
15.0000 mg | ORAL_CAPSULE | Freq: Every evening | ORAL | Status: DC | PRN
Start: 2013-07-09 — End: 2013-09-20

## 2013-07-09 NOTE — Telephone Encounter (Signed)
Refill request for Temazepam 15 1 po qhs Last seen 06/19/13 and filled 05/13/13 #30 with 1 refill.   Please advise      KP

## 2013-07-09 NOTE — Telephone Encounter (Signed)
Rx faxed.    KP 

## 2013-07-09 NOTE — Telephone Encounter (Signed)
Ok to refill x1 ,  1 refill

## 2013-07-17 ENCOUNTER — Other Ambulatory Visit: Payer: Self-pay | Admitting: Family Medicine

## 2013-07-17 NOTE — Telephone Encounter (Signed)
Last seen 06/19/13 and filled 06/23/13 #60.  UDS High risk and repeat overdue    Please advise     KP

## 2013-07-18 ENCOUNTER — Other Ambulatory Visit: Payer: Self-pay | Admitting: Family Medicine

## 2013-07-18 NOTE — Telephone Encounter (Signed)
Duplicate request.. Rx sent 07/17/13     KP

## 2013-07-22 ENCOUNTER — Other Ambulatory Visit: Payer: Self-pay | Admitting: Family Medicine

## 2013-08-15 ENCOUNTER — Other Ambulatory Visit: Payer: Self-pay | Admitting: Family Medicine

## 2013-08-15 NOTE — Telephone Encounter (Signed)
Last seen 06/19/13 and filled 07/04/13 #60 UDS 03/19/13 high risk  Please advise      KP

## 2013-08-18 ENCOUNTER — Telehealth: Payer: Self-pay | Admitting: Family Medicine

## 2013-08-18 MED ORDER — BENZONATATE 200 MG PO CAPS: 200.0000 mg | ORAL_CAPSULE | Freq: Three times a day (TID) | ORAL | Status: DC | PRN

## 2013-08-18 NOTE — Telephone Encounter (Signed)
Caller name: Fronie Relation to pt: Call back number: [phone] Pharmacy:[facility] [place], [place]   Reason for call:   Pt is requesting some cough syrup or something called in to help with cough and sore throat

## 2013-08-18 NOTE — Telephone Encounter (Signed)
Tessalon 200mg  1 po tid prn #20

## 2013-08-18 NOTE — Telephone Encounter (Signed)
Please advise      KP 

## 2013-08-18 NOTE — Telephone Encounter (Signed)
VM left advising Rx ready for pick up.     KP 

## 2013-08-19 ENCOUNTER — Other Ambulatory Visit: Payer: Self-pay | Admitting: Family Medicine

## 2013-08-19 NOTE — Telephone Encounter (Signed)
Last 06/19/13 and filled 07/17/13 #60     KP

## 2013-08-22 ENCOUNTER — Ambulatory Visit: Payer: BC Managed Care – PPO | Admitting: Family Medicine

## 2013-08-26 ENCOUNTER — Ambulatory Visit: Payer: BC Managed Care – PPO | Admitting: Family Medicine

## 2013-08-28 ENCOUNTER — Other Ambulatory Visit: Payer: Self-pay | Admitting: Gastroenterology

## 2013-09-10 ENCOUNTER — Telehealth: Payer: Self-pay | Admitting: Family Medicine

## 2013-09-10 ENCOUNTER — Emergency Department (HOSPITAL_COMMUNITY): Payer: BC Managed Care – PPO

## 2013-09-10 ENCOUNTER — Encounter (HOSPITAL_COMMUNITY): Payer: Self-pay | Admitting: Emergency Medicine

## 2013-09-10 ENCOUNTER — Emergency Department (HOSPITAL_COMMUNITY)
Admission: EM | Admit: 2013-09-10 | Discharge: 2013-09-10 | Disposition: A | Discharge status: Home / Self Care | Payer: BC Managed Care – PPO | Attending: Emergency Medicine | Admitting: Emergency Medicine

## 2013-09-10 DIAGNOSIS — K5900 Constipation, unspecified: Secondary | ICD-10-CM | POA: Insufficient documentation

## 2013-09-10 DIAGNOSIS — R6883 Chills (without fever): Secondary | ICD-10-CM | POA: Insufficient documentation

## 2013-09-10 DIAGNOSIS — N390 Urinary tract infection, site not specified: Secondary | ICD-10-CM

## 2013-09-10 DIAGNOSIS — R109 Unspecified abdominal pain: Secondary | ICD-10-CM

## 2013-09-10 DIAGNOSIS — R0602 Shortness of breath: Secondary | ICD-10-CM

## 2013-09-10 DIAGNOSIS — F17200 Nicotine dependence, unspecified, uncomplicated: Secondary | ICD-10-CM | POA: Insufficient documentation

## 2013-09-10 DIAGNOSIS — F411 Generalized anxiety disorder: Secondary | ICD-10-CM | POA: Insufficient documentation

## 2013-09-10 DIAGNOSIS — Z792 Long term (current) use of antibiotics: Secondary | ICD-10-CM | POA: Insufficient documentation

## 2013-09-10 DIAGNOSIS — M129 Arthropathy, unspecified: Secondary | ICD-10-CM | POA: Insufficient documentation

## 2013-09-10 DIAGNOSIS — Z79899 Other long term (current) drug therapy: Secondary | ICD-10-CM | POA: Insufficient documentation

## 2013-09-10 DIAGNOSIS — Z860100 Personal history of colon polyps, unspecified: Secondary | ICD-10-CM | POA: Insufficient documentation

## 2013-09-10 DIAGNOSIS — K219 Gastro-esophageal reflux disease without esophagitis: Secondary | ICD-10-CM | POA: Insufficient documentation

## 2013-09-10 DIAGNOSIS — I10 Essential (primary) hypertension: Secondary | ICD-10-CM | POA: Insufficient documentation

## 2013-09-10 DIAGNOSIS — Z950 Presence of cardiac pacemaker: Secondary | ICD-10-CM | POA: Insufficient documentation

## 2013-09-10 DIAGNOSIS — E785 Hyperlipidemia, unspecified: Secondary | ICD-10-CM | POA: Insufficient documentation

## 2013-09-10 DIAGNOSIS — J441 Chronic obstructive pulmonary disease with (acute) exacerbation: Secondary | ICD-10-CM | POA: Insufficient documentation

## 2013-09-10 DIAGNOSIS — Z8669 Personal history of other diseases of the nervous system and sense organs: Secondary | ICD-10-CM | POA: Insufficient documentation

## 2013-09-10 DIAGNOSIS — Z9889 Other specified postprocedural states: Secondary | ICD-10-CM | POA: Insufficient documentation

## 2013-09-10 DIAGNOSIS — Z9089 Acquired absence of other organs: Secondary | ICD-10-CM | POA: Insufficient documentation

## 2013-09-10 DIAGNOSIS — H539 Unspecified visual disturbance: Secondary | ICD-10-CM | POA: Insufficient documentation

## 2013-09-10 DIAGNOSIS — Z862 Personal history of diseases of the blood and blood-forming organs and certain disorders involving the immune mechanism: Secondary | ICD-10-CM | POA: Insufficient documentation

## 2013-09-10 DIAGNOSIS — G8929 Other chronic pain: Secondary | ICD-10-CM | POA: Insufficient documentation

## 2013-09-10 DIAGNOSIS — Z87442 Personal history of urinary calculi: Secondary | ICD-10-CM | POA: Insufficient documentation

## 2013-09-10 DIAGNOSIS — Z8739 Personal history of other diseases of the musculoskeletal system and connective tissue: Secondary | ICD-10-CM | POA: Insufficient documentation

## 2013-09-10 DIAGNOSIS — Z90710 Acquired absence of both cervix and uterus: Secondary | ICD-10-CM | POA: Insufficient documentation

## 2013-09-10 DIAGNOSIS — J45901 Unspecified asthma with (acute) exacerbation: Secondary | ICD-10-CM | POA: Insufficient documentation

## 2013-09-10 DIAGNOSIS — Z8541 Personal history of malignant neoplasm of cervix uteri: Secondary | ICD-10-CM | POA: Insufficient documentation

## 2013-09-10 DIAGNOSIS — M7989 Other specified soft tissue disorders: Secondary | ICD-10-CM | POA: Insufficient documentation

## 2013-09-10 DIAGNOSIS — F319 Bipolar disorder, unspecified: Secondary | ICD-10-CM | POA: Insufficient documentation

## 2013-09-10 DIAGNOSIS — R42 Dizziness and giddiness: Secondary | ICD-10-CM

## 2013-09-10 LAB — URINALYSIS, ROUTINE W REFLEX MICROSCOPIC
Bilirubin Urine: NEGATIVE
Glucose, UA: NEGATIVE mg/dL
Hgb urine dipstick: NEGATIVE
Ketones, ur: NEGATIVE mg/dL
Nitrite: NEGATIVE
Protein, ur: NEGATIVE mg/dL
Specific Gravity, Urine: 1.008 (ref 1.005–1.030)
Urobilinogen, UA: 0.2 mg/dL (ref 0.0–1.0)
pH: 6 (ref 5.0–8.0)

## 2013-09-10 LAB — COMPREHENSIVE METABOLIC PANEL
ALT: <5 U/L (ref 0–35)
AST: 12 U/L (ref 0–37)
Albumin: 3.6 g/dL (ref 3.5–5.2)
Alkaline Phosphatase: 72 U/L (ref 39–117)
BUN: 5 mg/dL — ABNORMAL LOW (ref 6–23)
CO2: 27 mEq/L (ref 19–32)
Calcium: 8.8 mg/dL (ref 8.4–10.5)
Chloride: 103 mEq/L (ref 96–112)
Creatinine, Ser: 0.75 mg/dL (ref 0.50–1.10)
GFR calc Af Amer: >90 mL/min (ref >90)
GFR calc non Af Amer: >90 mL/min (ref >90)
Glucose, Bld: 80 mg/dL (ref 70–99)
Potassium: 3.2 mEq/L — ABNORMAL LOW (ref 3.7–5.3)
Sodium: 141 mEq/L (ref 137–147)
Total Bilirubin: <0.2 mg/dL — ABNORMAL LOW (ref 0.3–1.2)
Total Protein: 6.6 g/dL (ref 6.0–8.3)

## 2013-09-10 LAB — CBC
HCT: 36.8 % (ref 36.0–46.0)
Hemoglobin: 12.8 g/dL (ref 12.0–15.0)
MCH: 32.7 pg (ref 26.0–34.0)
MCHC: 34.8 g/dL (ref 30.0–36.0)
MCV: 93.9 fL (ref 78.0–100.0)
Platelets: 252 10*3/uL (ref 150–400)
RBC: 3.92 MIL/uL (ref 3.87–5.11)
RDW: 12.4 % (ref 11.5–15.5)
WBC: 8.9 10*3/uL (ref 4.0–10.5)

## 2013-09-10 LAB — URINE MICROSCOPIC-ADD ON

## 2013-09-10 LAB — I-STAT TROPONIN, ED: Troponin i, poc: 0 ng/mL (ref 0.00–0.08)

## 2013-09-10 LAB — PRO B NATRIURETIC PEPTIDE: Pro B Natriuretic peptide (BNP): 15.9 pg/mL (ref 0–125)

## 2013-09-10 LAB — LIPASE, BLOOD: Lipase: 25 U/L (ref 11–59)

## 2013-09-10 MED ORDER — ONDANSETRON HCL 4 MG/2ML IJ SOLN
4.0000 mg | Freq: Once | INTRAMUSCULAR | Status: AC
  Administered 2013-09-10: 4 mg via INTRAVENOUS
  Filled 2013-09-10: qty 2

## 2013-09-10 MED ORDER — IOHEXOL 300 MG/ML  SOLN
100.0000 mL | Freq: Once | INTRAMUSCULAR | Status: AC | PRN
  Administered 2013-09-10: 100 mL via INTRAVENOUS

## 2013-09-10 MED ORDER — HYDROCODONE-ACETAMINOPHEN 5-325 MG PO TABS: 1.0000 | ORAL_TABLET | Freq: Four times a day (QID) | ORAL | Status: DC | PRN

## 2013-09-10 MED ORDER — CIPROFLOXACIN HCL 500 MG PO TABS
500.0000 mg | ORAL_TABLET | Freq: Two times a day (BID) | ORAL | Status: DC
Start: 2013-09-10 — End: 2013-11-26

## 2013-09-10 MED ORDER — HYDROMORPHONE HCL PF 1 MG/ML IJ SOLN
1.0000 mg | Freq: Once | INTRAMUSCULAR | Status: AC
  Administered 2013-09-10: 1 mg via INTRAVENOUS
  Filled 2013-09-10: qty 1

## 2013-09-10 MED ORDER — ALBUTEROL SULFATE HFA 108 (90 BASE) MCG/ACT IN AERS
2.0000 | INHALATION_SPRAY | RESPIRATORY_TRACT | Status: DC | PRN
  Administered 2013-09-10: 2 via RESPIRATORY_TRACT
  Filled 2013-09-10: qty 6.7

## 2013-09-10 NOTE — ED Notes (Signed)
Pt came with a list of complaints. Reports recent syncopal episodes, having sob, swelling to hands, feet and abdomen. Having black and irregular bowel movements. ekg done at triage, airway intact.

## 2013-09-10 NOTE — Telephone Encounter (Signed)
Spoke with C-A-N and Dr.Lowne recommends ED due to patient's symptoms.      KP

## 2013-09-10 NOTE — ED Notes (Signed)
Patient returned from CT

## 2013-09-10 NOTE — ED Notes (Signed)
Patient asking for more Zofran to finish contrast. Dr. Rogene Houston made aware.

## 2013-09-10 NOTE — ED Provider Notes (Signed)
CSN: [id number]     Arrival date & time 09/10/13  1420 History   First MD Initiated Contact with Patient 09/10/13 1706     Chief Complaint  Patient presents with  . Shortness of Breath  . Leg Swelling     (Consider location/radiation/quality/duration/timing/severity/associated sxs/prior Treatment) The history is provided by the patient.   patient is followed [facility]. 38 year old female she contacted them they referred her in to the ED. Patient with a two-week history of abdominal pain lower quadrant dizziness and shortness of breath and swelling in her legs. Patient has a history of in her special cystitis. Patient states that the belly pain is different is 10 out of 10 nonradiating the back mostly suprapubic area. No nausea no vomiting no diarrhea. Dizziness is non-vertigo. The shortness of breath has been intermittent. Associated with a cough.  Past Medical History  Diagnosis Date  . 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 SLEEP APNEA 08/31/2007  . 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 DEPRESSION 08/31/2007  . ARTHRITIS 08/31/2007  . HYPERTENSION 08/31/2007  . Chronic interstitial cystitis 03/11/2009  . IBS 03/11/2009  . GERD 02/05/2009  . BRONCHITIS, RECURRENT 08/23/2009    Asthmatic Bronchitis-Dr. Melvyn Novas.....-HFA 75% 12/04/2008>75% 02/05/2009>75% 08/04/2009 -PFT's 01/04/2009 2.56 (86%) ratio 75, no resp to B2 and DLC0 67% > 80 after correction   . Anal fissure 03/11/2009  . RECTAL BLEEDING 03/11/2009  . COLONIC POLYPS, HX OF 07/25/2006    ADENOMATOUS POLYP  . Migraine headache   . Chronic pain   . Chronic nausea   . COPD (chronic obstructive pulmonary disease)   . Asthma   . Anxiety and depression   . Uterine cyst   . Endometriosis   . Bowel obstruction   . Colon polyps   . Internal hemorrhoids   . Cancer     cervical cancer  . FIBROMYALGIA 08/31/2007  . Arthritis   . Anemia   . Anxiety   . Hyperlipidemia   . Seizures     been  about 1 year since last seisure per pt  . Bipolar 1 disorder    Past Surgical History  Procedure Laterality Date  . Cholecystectomy    . Abdominal hysterectomy    . Pacemaker insertion      in hip for interstitial cystitis  . Bladder surgery      stimulator placed and stretching   . Interstitial cystitis    . Bladder stretching x6    . Replaced bladder pacemaker    . Removal of uterine cyst and scrapped uterus    . Colonoscopy     Family History  Problem Relation Age of Onset  . Heart disease Father   . Asthma Maternal Grandmother   . Emphysema Maternal Grandfather   . Cancer Maternal Grandfather     Lung Cancer  . Cancer Other     Lung Cancer-Aunt  . Colon cancer Neg Hx   . Esophageal cancer Neg Hx   . Rectal cancer Neg Hx   . Stomach cancer Neg Hx    History  Substance Use Topics  . Smoking status: Current Every Day Smoker -- 1.00 packs/day for 14 years    Types: Cigarettes  . Smokeless tobacco: Never Used  . Alcohol Use: Yes     Comment: occasional wine    OB History   Grav Para Term Preterm Abortions TAB SAB Ect Mult Living  Review of Systems  Constitutional: Positive for chills and fatigue. Negative for fever.  HENT: Negative for congestion.   Eyes: Positive for visual disturbance. Negative for redness.  Respiratory: Positive for shortness of breath.   Cardiovascular: Positive for leg swelling. Negative for chest pain.  Gastrointestinal: Positive for abdominal pain and constipation. Negative for nausea, vomiting and diarrhea.  Genitourinary: Positive for dysuria.  Musculoskeletal: Negative for back pain.  Skin: Negative for rash.  Neurological: Positive for dizziness. Negative for speech difficulty, weakness, light-headedness, numbness and headaches.  Hematological: Does not bruise/bleed easily.  Psychiatric/Behavioral: Negative for confusion.      Allergies  Abilify; Metoclopramide hcl; Tramadol; Ambien; Eszopiclone; Amitriptyline;  Metoclopramide; Varenicline; Demerol; Emetrol; Morphine and related; and Propoxyphene n-acetaminophen  Home Medications   Prior to Admission medications   Medication Sig Start Date End Date Taking? Authorizing Provider  albuterol (VENTOLIN HFA) 108 (90 BASE) MCG/ACT inhaler 2 puffs every 4-6 hours as needed for wheezing/shortness of breath   Yes Historical Provider, MD  benzonatate (TESSALON) 200 MG capsule Take 1 capsule (200 mg total) by mouth 3 (three) times daily as needed for cough. 08/18/13  Yes Yvonne R Lowne, DO  CALCIUM PO Take 1 tablet by mouth 2 (two) times daily.   Yes Historical Provider, MD  cetirizine (ZYRTEC) 10 MG tablet Take 10 mg by mouth at bedtime as needed for allergies.   Yes Historical Provider, MD  Cholecalciferol (VITAMIN D) 2000 UNITS CAPS Take 1 capsule by mouth daily.   Yes Historical Provider, MD  clonazePAM (KLONOPIN) 1 MG tablet TAKE ONE TABLET BY MOUTH TWICE DAILY 08/15/13  Yes Alferd Apa Lowne, DO  dicyclomine (BENTYL) 20 MG tablet Take 20 mg by mouth. 09/17/12  Yes Historical Provider, MD  docusate sodium (COLACE) 100 MG capsule Take 100 mg by mouth. 03/31/13  Yes Historical Provider, MD  doxazosin (CARDURA) 2 MG tablet TAKE ONE TABLET BY MOUTH AT BEDTIME 07/22/13  Yes Yvonne R Lowne, DO  estradiol (ESTRACE) 1 MG tablet Take 2 mg by mouth daily.   Yes Historical Provider, MD  folic acid (FOLVITE) 1 MG tablet Take 1 mg by mouth every morning.    Yes Historical Provider, MD  HYDROcodone-acetaminophen (NORCO) 10-325 MG per tablet Take 1 tablet by mouth. 06/18/13  Yes Historical Provider, MD  hyoscyamine (LEVSIN, ANASPAZ) 0.125 MG tablet Take 0.125 mg by mouth every 4 (four) hours as needed for bladder spasms.   Yes Historical Provider, MD  omeprazole (PRILOSEC) 40 MG capsule TAKE ONE CAPSULE BY MOUTH ONCE DAILY 01/03/13  Yes Rosalita Chessman, DO  phenazopyridine (PYRIDIUM) 200 MG tablet Take 200 mg by mouth 3 (three) times daily as needed for pain.   Yes Historical Provider,  MD  QUEtiapine (SEROQUEL) 300 MG tablet Take 1 tablet (300 mg total) by mouth 2 (two) times daily. 06/19/13  Yes Yvonne R Lowne, DO  sertraline (ZOLOFT) 100 MG tablet TAKE ONE TABLET BY MOUTH ONCE DAILY 03/07/13  Yes Yvonne R Lowne, DO  SYMBICORT 160-4.5 MCG/ACT inhaler INHALE TWO PUFFS BY MOUTH TWICE DAILY   Yes Yvonne R Lowne, DO  temazepam (RESTORIL) 15 MG capsule Take 1 capsule (15 mg total) by mouth at bedtime as needed for sleep. 07/09/13  Yes Rosalita Chessman, DO  traZODone (DESYREL) 50 MG tablet TAKE TWO TABLETS BY MOUTH AT BEDTIME 08/19/13  Yes Yvonne R Lowne, DO  vitamin B-12 (CYANOCOBALAMIN) 1000 MCG tablet Take 1,000 mcg by mouth.   Yes Historical Provider, MD  ciprofloxacin (CIPRO) 500 MG  tablet Take 1 tablet (500 mg total) by mouth 2 (two) times daily. 09/10/13   Fredia Sorrow, MD  HYDROcodone-acetaminophen (NORCO/VICODIN) 5-325 MG per tablet Take 1-2 tablets by mouth every 6 (six) hours as needed for moderate pain. 09/10/13   Fredia Sorrow, MD   BP 91/64  Pulse 79  Temp(Src) 97.9 F (36.6 C) (Oral)  Resp 16  Ht 5\' 4"  (1.626 m)  Wt 105 lb (47.628 kg)  BMI 18.01 kg/m2  SpO2 94% Physical Exam  Nursing note and vitals reviewed. Constitutional: She is oriented to person, place, and time. She appears well-developed and well-nourished. No distress.  HENT:  Head: Normocephalic and atraumatic.  Mouth/Throat: Oropharynx is clear and moist.  Eyes: EOM are normal. Pupils are equal, round, and reactive to light.  Neck: Normal range of motion. Neck supple.  Cardiovascular: Normal rate, regular rhythm and normal heart sounds.   Abdominal: Soft. Bowel sounds are normal. There is tenderness.  Tender about both lower quadrants but mostly in the suprapubic area.  Musculoskeletal: Normal range of motion. She exhibits no edema.  No leg swelling.  Neurological: She is alert and oriented to person, place, and time. No cranial nerve deficit. She exhibits normal muscle tone. Coordination normal.   Skin: Skin is warm. No rash noted.    ED Course  Procedures (including critical care time) Labs Review Labs Reviewed  COMPREHENSIVE METABOLIC PANEL - Abnormal; Notable for the following:    Potassium 3.2 (*)    BUN 5 (*)    Total Bilirubin <0.2 (*)    All other components within normal limits  URINALYSIS, ROUTINE W REFLEX MICROSCOPIC - Abnormal; Notable for the following:    APPearance CLOUDY (*)    Leukocytes, UA LARGE (*)    All other components within normal limits  URINE MICROSCOPIC-ADD ON - Abnormal; Notable for the following:    Bacteria, UA MANY (*)    All other components within normal limits  URINE CULTURE  CBC  PRO B NATRIURETIC PEPTIDE  LIPASE, BLOOD  I-STAT TROPOININ, ED   Results for orders placed during the hospital encounter of 09/10/13  CBC      Result Value Ref Range   WBC 8.9  4.0 - 10.5 K/uL   RBC 3.92  3.87 - 5.11 MIL/uL   Hemoglobin 12.8  12.0 - 15.0 g/dL   HCT 36.8  36.0 - 46.0 %   MCV 93.9  78.0 - 100.0 fL   MCH 32.7  26.0 - 34.0 pg   MCHC 34.8  30.0 - 36.0 g/dL   RDW 12.4  11.5 - 15.5 %   Platelets 252  150 - 400 K/uL  PRO B NATRIURETIC PEPTIDE      Result Value Ref Range   Pro B Natriuretic peptide (BNP) 15.9  0 - 125 pg/mL  COMPREHENSIVE METABOLIC PANEL      Result Value Ref Range   Sodium 141  137 - 147 mEq/L   Potassium 3.2 (*) 3.7 - 5.3 mEq/L   Chloride 103  96 - 112 mEq/L   CO2 27  19 - 32 mEq/L   Glucose, Bld 80  70 - 99 mg/dL   BUN 5 (*) 6 - 23 mg/dL   Creatinine, Ser 0.75  0.50 - 1.10 mg/dL   Calcium 8.8  8.4 - 10.5 mg/dL   Total Protein 6.6  6.0 - 8.3 g/dL   Albumin 3.6  3.5 - 5.2 g/dL   AST 12  0 - 37 U/L   ALT <5  0 - 35 U/L   Alkaline Phosphatase 72  39 - 117 U/L   Total Bilirubin <0.2 (*) 0.3 - 1.2 mg/dL   GFR calc non Af Amer >90  >90 mL/min   GFR calc Af Amer >90  >90 mL/min  URINALYSIS, ROUTINE W REFLEX MICROSCOPIC      Result Value Ref Range   Color, Urine YELLOW  YELLOW   APPearance CLOUDY (*) CLEAR   Specific  Gravity, Urine 1.008  1.005 - 1.030   pH 6.0  5.0 - 8.0   Glucose, UA NEGATIVE  NEGATIVE mg/dL   Hgb urine dipstick NEGATIVE  NEGATIVE   Bilirubin Urine NEGATIVE  NEGATIVE   Ketones, ur NEGATIVE  NEGATIVE mg/dL   Protein, ur NEGATIVE  NEGATIVE mg/dL   Urobilinogen, UA 0.2  0.0 - 1.0 mg/dL   Nitrite NEGATIVE  NEGATIVE   Leukocytes, UA LARGE (*) NEGATIVE  URINE MICROSCOPIC-ADD ON      Result Value Ref Range   Squamous Epithelial / LPF RARE  RARE   WBC, UA 11-20  <3 WBC/hpf   Bacteria, UA MANY (*) RARE  LIPASE, BLOOD      Result Value Ref Range   Lipase 25  11 - 59 U/L  I-STAT TROPOININ, ED      Result Value Ref Range   Troponin i, poc 0.00  0.00 - 0.08 ng/mL   Comment 3              Imaging Review Dg Chest 2 View  09/10/2013   CLINICAL DATA:  SHORTNESS OF BREATH LEG SWELLING  EXAM: CHEST  2 VIEW  COMPARISON:  Two-view chest 12/19/2012  FINDINGS: The heart size and mediastinal contours are within normal limits. Both lungs are clear. The visualized skeletal structures are unremarkable.  IMPRESSION: No active cardiopulmonary disease.   Electronically Signed   By: Margaree Mackintosh M.D.   On: 09/10/2013 15:12   Ct Head Wo Contrast  09/10/2013   CLINICAL DATA:  Dizziness and weakness.  EXAM: CT HEAD WITHOUT CONTRAST  TECHNIQUE: Contiguous axial images were obtained from the base of the skull through the vertex without intravenous contrast.  COMPARISON:  08/26/2009  FINDINGS: There is no evidence for acute hemorrhage, hydrocephalus, mass lesion, or abnormal extra-axial fluid collection. No definite CT evidence for acute infarction. The visualized paranasal sinuses and mastoid air cells are clear.  IMPRESSION: Normal exam.   Electronically Signed   By: Misty Stanley M.D.   On: 09/10/2013 19:14   Ct Abdomen Pelvis W Contrast  09/10/2013   CLINICAL DATA:  Weakness, nausea, dizziness and constipation.  EXAM: CT ABDOMEN AND PELVIS WITH CONTRAST  TECHNIQUE: Multidetector CT imaging of the abdomen and  pelvis was performed using the standard protocol following bolus administration of intravenous contrast.  CONTRAST:  180mL OMNIPAQUE IOHEXOL 300 MG/ML  SOLN  COMPARISON:  06/13/2012  FINDINGS: The liver shows mild steatosis. There is stable extrahepatic biliary dilatation status post cholecystectomy with maximal common duct diameter of 13 mm. Stable pancreatic divisum. The spleen, adrenal glands and kidneys are within normal limits. Bowel shows no evidence of obstruction, inflammation or lesion.  No free fluid, free air or abscess. No masses or enlarged lymph nodes is seen. No vascular abnormalities. No hernias. The bladder is unremarkable.  IMPRESSION: 1. No acute findings in the abdomen or pelvis. 2. Stable dilatation of the common bile duct post cholecystectomy. 3. Hepatic steatosis.   Electronically Signed   By: Aletta Edouard M.D.   On: 09/10/2013  19:26     EKG Interpretation   Date/Time:  Wednesday September 10 2013 14:26:31 EDT Ventricular Rate:  83 PR Interval:  128 QRS Duration: 64 QT Interval:  332 QTC Calculation: 390 R Axis:   82 Text Interpretation:  Normal sinus rhythm T wave abnormality, consider  inferolateral ischemia Abnormal ECG No significant change since last  tracing Confirmed by Errin Whitelaw  MD, Michaelah Credeur ([id number]) on 09/10/2013 5:23:49 PM      MDM   Final diagnoses:  Abdominal pain  Dizziness  Shortness of breath  UTI (lower urinary tract infection)    Patient with 3 main complaints. One was a lower abdominal pain. Shortness of breath and dizziness. Patient's head CT was negative CT scan of the abdomen was negative. Patient's urinalysis question will urinary tract infection. Patient does have a history of cystitis so it could be that the wound treat with antibiotics Cipro. Patient, pain also be treated with pain medication. Patient is followed by family practice and can followup with them in the next few days. Work note provided. Patient improved here some with pain medication.  No fevers no significant vital sign abnormalities.   Fredia Sorrow, MD 09/10/13 2005

## 2013-09-10 NOTE — ED Notes (Signed)
Called CT and notified patient has finished contrast.

## 2013-09-10 NOTE — Telephone Encounter (Signed)
Patient Information:  Caller Name: Shakerria  Phone: [phone]  Patient: [name], [name]  Gender: Female  DOB: Oct 17, 1975  Age: 38 Years  PCP: Rosalita Chessman.  Pregnant: No  Office Follow Up:  Does the office need to follow up with this patient?: No  Instructions For The Office: N/A   Symptoms  Reason For Call & Symptoms: Had been passing out for years then stopped passing out about 1 year ago. Episodes restarting last week 5/26 or 5/27.  Last episode was Monday 6/1 - both times she was unconscious for several hours. Has low abd pain across from hip to hip and pain in right low side that goes around to back.  Both legs, feet, hand swollen (has not been able toget rings off for 3 weeks). About one stool per month with lot of laxatives but no PeptoBismal, all stools black for the last 6 months.  Abdomen swollen, hard, tender to touch.  At times gets dizzy when gets up, can become SOB, feel heaviness in chest.  Reviewed Health History In EMR: Yes  Reviewed Medications In EMR: Yes  Reviewed Allergies In EMR: Yes  Reviewed Surgeries / Procedures: Yes  Date of Onset of Symptoms: 09/03/2013 OB / GYN:  LMP: Unknown  Guideline(s) Used:  Fainting  Rectal Bleeding  Disposition Per Guideline:   Go to ED Now (or to Office with PCP Approval)  Reason For Disposition Reached:   Bloody, black, or tarry bowel movements  Advice Given:  N/A  Patient Will Follow Care Advice:  YES  Spoke with office, no appointments -- Agrees to go to [facility] ER

## 2013-09-10 NOTE — Discharge Instructions (Signed)
Workup for your symptoms without any significant findings. There is a questionable urinary tract infection but could be your baseline cystitis. However we'll treat with antibiotic take pain medication as directed. Followup with family practice in the next few days.

## 2013-09-10 NOTE — ED Notes (Signed)
Pt reports doing self caths at home, unable to obtain urine sample at triage.

## 2013-09-10 NOTE — ED Notes (Signed)
Dr. Zackowski at the bedside.  

## 2013-09-12 LAB — URINE CULTURE: Colony Count: >=100000

## 2013-09-13 ENCOUNTER — Telehealth (HOSPITAL_BASED_OUTPATIENT_CLINIC_OR_DEPARTMENT_OTHER): Payer: Self-pay | Admitting: Emergency Medicine

## 2013-09-13 NOTE — Telephone Encounter (Signed)
Post ED Visit - Positive Culture Follow-up  Culture report reviewed by antimicrobial stewardship pharmacist: []  Wes Dulaney, Pharm.D., BCPS [x]  Heide Guile, Pharm.D., BCPS []  Alycia Rossetti, Pharm.D., BCPS []  Laton, Pharm.D., BCPS, AAHIVP []  Legrand Como, Pharm.D., BCPS, AAHIVP []  Juliene Pina, Pharm.D.  Positive urine culture Treated with Cipro, organism sensitive to the same and no further patient follow-up is required at this time.  Shayann Garbutt 09/13/2013, 10:46 AM

## 2013-09-15 ENCOUNTER — Other Ambulatory Visit: Payer: Self-pay | Admitting: Family Medicine

## 2013-09-15 NOTE — Telephone Encounter (Signed)
Last seen 06/19/13 and filled 08/15/13 #60.   Please advise     KP

## 2013-09-15 NOTE — Telephone Encounter (Signed)
Caller name: Harvey Lingo Relation to ER:[id number]  Call back number: [phone] Pharmacy:[facility] [place], [place]   Reason for call: patient called to check the status of refilling her clonazePAM (KLONOPIN) 1 MG tablet. Please advise

## 2013-09-16 ENCOUNTER — Other Ambulatory Visit: Payer: Self-pay | Admitting: Gastroenterology

## 2013-09-17 ENCOUNTER — Other Ambulatory Visit: Payer: Self-pay | Admitting: Family Medicine

## 2013-09-17 NOTE — Telephone Encounter (Signed)
Last seen 06/19/13 and filled 08/19/13 #60. Please advise     KP

## 2013-09-18 ENCOUNTER — Other Ambulatory Visit: Payer: Self-pay | Admitting: Family Medicine

## 2013-09-20 ENCOUNTER — Other Ambulatory Visit: Payer: Self-pay | Admitting: Family Medicine

## 2013-09-22 NOTE — Telephone Encounter (Signed)
Last seen 06/19/13 and filled 07/09/13 #30 with 1 refill. Please advise     KP

## 2013-10-14 ENCOUNTER — Other Ambulatory Visit: Payer: Self-pay | Admitting: Family Medicine

## 2013-10-14 NOTE — Telephone Encounter (Signed)
Last seen 06/19/13 and filled Klonopin 09/15/13 #60 and Trazodone 09/18/13 #60. Please advise    KP

## 2013-10-17 NOTE — Telephone Encounter (Signed)
Caller name: Joyce Relation to pt: Call back number: [phone]   Reason for call:  Pt is really needing this RX refilled, she has run out since her request on 7/7  Again it is RX clonazePAM (KLONOPIN) 1 MG tablet

## 2013-11-16 ENCOUNTER — Other Ambulatory Visit: Payer: Self-pay | Admitting: Family Medicine

## 2013-11-17 ENCOUNTER — Other Ambulatory Visit: Payer: Self-pay | Admitting: Family Medicine

## 2013-11-17 NOTE — Telephone Encounter (Signed)
Last seen 06/19/13 and filled 10/20/13 #60 Please advise    KP

## 2013-11-20 ENCOUNTER — Other Ambulatory Visit: Payer: Self-pay | Admitting: Family Medicine

## 2013-11-20 NOTE — Telephone Encounter (Signed)
Last seen 06/19/13 and filled 10/20/13 #60. Please advise     KP

## 2013-11-26 ENCOUNTER — Ambulatory Visit: Payer: BC Managed Care – PPO | Admitting: Family Medicine

## 2013-11-26 ENCOUNTER — Telehealth: Payer: Self-pay | Admitting: General Practice

## 2013-11-26 ENCOUNTER — Ambulatory Visit (INDEPENDENT_AMBULATORY_CARE_PROVIDER_SITE_OTHER): Payer: BC Managed Care – PPO | Admitting: Family Medicine

## 2013-11-26 ENCOUNTER — Encounter: Payer: Self-pay | Admitting: Family Medicine

## 2013-11-26 VITALS — BP 110/80 | HR 86 | Temp 98.3°F | Resp 16 | Wt 108.4 lb

## 2013-11-26 DIAGNOSIS — R300 Dysuria: Secondary | ICD-10-CM

## 2013-11-26 DIAGNOSIS — N760 Acute vaginitis: Secondary | ICD-10-CM | POA: Insufficient documentation

## 2013-11-26 DIAGNOSIS — J441 Chronic obstructive pulmonary disease with (acute) exacerbation: Secondary | ICD-10-CM

## 2013-11-26 MED ORDER — ALBUTEROL SULFATE HFA 108 (90 BASE) MCG/ACT IN AERS: 2.0000 | INHALATION_SPRAY | RESPIRATORY_TRACT | Status: DC | PRN

## 2013-11-26 MED ORDER — GUAIFENESIN-CODEINE 100-10 MG/5ML PO SYRP: ORAL_SOLUTION | ORAL | Status: DC

## 2013-11-26 MED ORDER — IPRATROPIUM-ALBUTEROL 0.5-2.5 (3) MG/3ML IN SOLN
3.0000 mL | Freq: Once | RESPIRATORY_TRACT | Status: AC
  Administered 2013-11-26: 3 mL via RESPIRATORY_TRACT

## 2013-11-26 MED ORDER — BUDESONIDE-FORMOTEROL FUMARATE 160-4.5 MCG/ACT IN AERO: INHALATION_SPRAY | RESPIRATORY_TRACT | Status: DC

## 2013-11-26 MED ORDER — PROMETHAZINE HCL 25 MG PO TABS: 25.0000 mg | ORAL_TABLET | Freq: Three times a day (TID) | ORAL | Status: DC | PRN

## 2013-11-26 MED ORDER — FLUCONAZOLE 150 MG PO TABS: 150.0000 mg | ORAL_TABLET | Freq: Once | ORAL | Status: DC

## 2013-11-26 MED ORDER — SULFAMETHOXAZOLE-TMP DS 800-160 MG PO TABS: 1.0000 | ORAL_TABLET | Freq: Two times a day (BID) | ORAL | Status: DC

## 2013-11-26 NOTE — Progress Notes (Signed)
Pre visit review using our clinic review tool, if applicable. No additional management support is needed unless otherwise documented below in the visit note. 

## 2013-11-26 NOTE — Progress Notes (Signed)
   Subjective:    Patient ID: [name], female    DOB: 22-Dec-1975, 38 y.o.   MRN: [id number]  HPI COPD- pt reports wheezing in her chest x2 months.  'i cough so bad that my ribs hurt'.  'it's stuck right here but I can't get it out'.  + SOB.  Chronic smoker.  Out of Symbicort and Proair.  No fevers.  No known sick contacts.  'i'm pretty sure I got a yeast infection and maybe a bladder infection'- + nausea, asking for refill on medication (phenergan).  'unable to eat'- 'it makes me sick'.  + vaginal itching- using medicated wipes.  Pt self caths- 'burns when I pee'.  Unable to empty bladder w/o cath.  + vaginal d/c- clumpy.  + odor.   Review of Systems For ROS see HPI     Objective:   Physical Exam  Vitals reviewed. Constitutional: She appears well-developed and well-nourished. No distress.  HENT:  Head: Normocephalic and atraumatic.  TMs normal bilaterally Mild nasal congestion Throat w/out erythema, edema, or exudate  Eyes: Conjunctivae and EOM are normal. Pupils are equal, round, and reactive to light.  Neck: Normal range of motion. Neck supple.  Cardiovascular: Normal rate, regular rhythm, normal heart sounds and intact distal pulses.   No murmur heard. Pulmonary/Chest: Effort normal. No respiratory distress. She has wheezes (scattered expiratory wheezes, improved s/p neb tx).  No cough heard  Genitourinary:  Pt declined  Lymphadenopathy:    She has no cervical adenopathy.          Assessment & Plan:

## 2013-11-26 NOTE — Patient Instructions (Signed)
Follow up as needed Start the Bactrim twice daily- take w/ food- for both the COPD and bladder Drink plenty of fluids Restart your Symbicort daily as directed and use the albuterol as needed Use the phenergan as needed for nausea Use the cough syrup as needed Take the Diflucan for yeast Call with any questions or concerns Hang in there!

## 2013-11-26 NOTE — Telephone Encounter (Signed)
Pt was in for an appt with Tabori today. Advised that she needs a refill on her temazepam.   Last OV 06-19-13 Med filled 07-09-13 #30 with 1

## 2013-11-26 NOTE — Assessment & Plan Note (Signed)
New.  Pt declined PE today.  Will start flagyl for reported infxn and also b/c pt is starting abx course.  If pt's sxs don't improve w/ tx, will need to return for wet prep and exam.  Pt expressed understanding and is in agreement w/ plan.

## 2013-11-26 NOTE — Assessment & Plan Note (Signed)
New to provider, recurrent issue for pt.  Unable to give urine sample in office today.  Hx of interstitial cystitis.  Since pt requires abx for COPD exacerbation, chose Bactrim to treat possible UTI as well.

## 2013-11-26 NOTE — Assessment & Plan Note (Signed)
New.  Pt has hx of COPD and has been out of inhalers.  Wheezing improved w/ breathing tx in office.  Inhalers refilled.  Script for abx sent.  Cough meds prn.  Stressed importance of smoking cessation.  Reviewed supportive care and red flags that should prompt return.  Pt expressed understanding and is in agreement w/ plan.

## 2013-11-27 ENCOUNTER — Other Ambulatory Visit: Payer: Self-pay | Admitting: Family Medicine

## 2013-11-27 ENCOUNTER — Telehealth: Payer: Self-pay | Admitting: Family Medicine

## 2013-11-27 MED ORDER — TEMAZEPAM 15 MG PO CAPS: ORAL_CAPSULE | ORAL | Status: DC

## 2013-11-27 MED ORDER — BENZONATATE 200 MG PO CAPS: 200.0000 mg | ORAL_CAPSULE | Freq: Three times a day (TID) | ORAL | Status: DC | PRN

## 2013-11-27 NOTE — Telephone Encounter (Signed)
Rx faxed.    KP 

## 2013-11-27 NOTE — Telephone Encounter (Signed)
Please advise on a new cough medication.   KP

## 2013-11-27 NOTE — Telephone Encounter (Signed)
Caller name: Rada Relation to pt: self  Call back number: [facility]: [facility] [phone]  Reason for call: pt is feeling nausea and expericing soar throat due to Cheratussin. Requesting new rx please notify pt when rx is sent to pharmacy.

## 2013-11-27 NOTE — Telephone Encounter (Signed)
Rx faxed, patient aware     KP

## 2013-11-27 NOTE — Telephone Encounter (Signed)
Tessalon 200 mg 1 po tid  #30

## 2013-11-27 NOTE — Telephone Encounter (Signed)
Refill x1 

## 2013-12-03 ENCOUNTER — Other Ambulatory Visit: Payer: Self-pay | Admitting: Family Medicine

## 2013-12-03 ENCOUNTER — Telehealth: Payer: Self-pay | Admitting: Family Medicine

## 2013-12-03 DIAGNOSIS — R05 Cough: Secondary | ICD-10-CM

## 2013-12-03 DIAGNOSIS — R059 Cough, unspecified: Secondary | ICD-10-CM

## 2013-12-03 MED ORDER — PROMETHAZINE-DM 6.25-15 MG/5ML PO SYRP: 5.0000 mL | ORAL_SOLUTION | Freq: Four times a day (QID) | ORAL | Status: DC | PRN

## 2013-12-03 NOTE — Telephone Encounter (Signed)
Caller name: Fonnie  Relation to pt: self  Call back number: [phone] Pharmacy: [facility] [phone]  Reason for call:   Pt exp allergic reaction to benzonatate (TESSALON) 200 MG capsule forehead and eye socket goes numb. Pt requesting new medication for severe cough. Pt stop taking.

## 2013-12-03 NOTE — Telephone Encounter (Signed)
She has tried Insurance risk surveyor. Please advise     KP

## 2013-12-03 NOTE — Telephone Encounter (Signed)
Rx has been faxed.    KP 

## 2013-12-03 NOTE — Telephone Encounter (Signed)
Phenergan / guaifenesin--sent to pharm

## 2013-12-16 ENCOUNTER — Other Ambulatory Visit: Payer: Self-pay | Admitting: Family Medicine

## 2013-12-18 ENCOUNTER — Other Ambulatory Visit: Payer: Self-pay | Admitting: Family Medicine

## 2013-12-18 NOTE — Telephone Encounter (Signed)
Last seen 06/19/13 and filled 12/16/13 #60. Please advise     KP

## 2013-12-29 ENCOUNTER — Other Ambulatory Visit: Payer: Self-pay | Admitting: Family Medicine

## 2013-12-31 ENCOUNTER — Other Ambulatory Visit: Payer: Self-pay | Admitting: Family Medicine

## 2013-12-31 NOTE — Telephone Encounter (Signed)
Medication: temazepam (RESTORIL) 15 MG capsule-TAKE ONE CAPSULE BY MOUTH AT BEDTIME AS NEEDED FOR SLEEP.  Last OV: 11/26/13 Last filled:  11/27/13 Amt:  30 capsules, 0 refill   Please advise.

## 2014-01-05 ENCOUNTER — Other Ambulatory Visit: Payer: Self-pay | Admitting: Family Medicine

## 2014-01-08 ENCOUNTER — Other Ambulatory Visit: Payer: Self-pay | Admitting: Family Medicine

## 2014-01-12 ENCOUNTER — Other Ambulatory Visit: Payer: Self-pay | Admitting: Family Medicine

## 2014-01-13 ENCOUNTER — Other Ambulatory Visit: Payer: Self-pay | Admitting: Family Medicine

## 2014-01-13 NOTE — Telephone Encounter (Signed)
Last seen 06/27/13  Filled 12/18/13 #60 No UDS on file  Lowne patient  Please advise     KP

## 2014-01-26 ENCOUNTER — Telehealth: Payer: Self-pay | Admitting: Family Medicine

## 2014-01-26 NOTE — Telephone Encounter (Signed)
Caller name: Ulanda Relation to pt: self Call back number: [phone] [facility]: [facility] [place]  Reason for call:   Patient is requesting a refill of trazadone

## 2014-01-26 NOTE — Telephone Encounter (Signed)
Trazodone refill request.  Last seen 06/19/13 and filled 12/16/13 #60 No UDS  Please advise     KP

## 2014-01-26 NOTE — Telephone Encounter (Signed)
Ok  #60, no refills

## 2014-01-27 MED ORDER — TRAZODONE HCL 50 MG PO TABS: ORAL_TABLET | ORAL | Status: DC

## 2014-02-02 ENCOUNTER — Telehealth: Payer: Self-pay

## 2014-02-02 MED ORDER — TEMAZEPAM 15 MG PO CAPS: ORAL_CAPSULE | ORAL | Status: DC

## 2014-02-02 NOTE — Telephone Encounter (Signed)
Restoril 15 Last seen 11/26/13 and filled 01/01/14 #30  Patient is also receiving Trazodone 50 2 po qhs .   Please advise    KP

## 2014-02-02 NOTE — Telephone Encounter (Signed)
None of the numbers on the patient's chart is working at this time.      KP

## 2014-02-02 NOTE — Telephone Encounter (Signed)
Refill x1  ---- did she ever get into psych-- she was supposed to make an appointment

## 2014-02-03 ENCOUNTER — Other Ambulatory Visit: Payer: Self-pay

## 2014-02-03 MED ORDER — DOXAZOSIN MESYLATE 2 MG PO TABS: ORAL_TABLET | ORAL | Status: DC

## 2014-02-03 MED ORDER — PROMETHAZINE HCL 25 MG PO TABS: ORAL_TABLET | ORAL | Status: DC

## 2014-02-13 ENCOUNTER — Telehealth: Payer: Self-pay | Admitting: *Deleted

## 2014-02-13 NOTE — Telephone Encounter (Signed)
Pt was supposed to go to psych-- they should write it

## 2014-02-13 NOTE — Telephone Encounter (Signed)
Left a message for call back.  

## 2014-02-13 NOTE — Telephone Encounter (Signed)
Requesting Klonopin 1mg -Take 1 table by mouth twice daily. Last refill:01/13/14;#60,0 Last OV:06/19/13 UDS:03/19/13-Note:(No more Klonopin-03/28/13) Please advise.//AB/CMA

## 2014-02-16 NOTE — Telephone Encounter (Signed)
Left a message for call back on spouse's phone.  Numbers listed for patient are invalid.

## 2014-02-17 NOTE — Telephone Encounter (Signed)
Klonopin 0.5 mg 1 po bid ^60 0 refills

## 2014-02-17 NOTE — Telephone Encounter (Signed)
Pt was informed that we wanted her to get her meds from psych

## 2014-02-17 NOTE — Telephone Encounter (Signed)
She said she is going cold [place] and she is having the shakes/tremors something awful. She has been without it since last Thursday. I made her aware I would get back to her and if the tremors get to bad go to the ED.   Please advise     KP

## 2014-02-17 NOTE — Telephone Encounter (Signed)
Spoke with patient who states that she has not been to psych in 2-3 months due to cost. She was unable to tell me who she was seeing in psych.  Call back number: [phone] Please advised

## 2014-02-18 MED ORDER — CLONAZEPAM 1 MG PO TABS: ORAL_TABLET | ORAL | Status: DC

## 2014-02-18 NOTE — Telephone Encounter (Signed)
Rx faxed.    KP 

## 2014-02-25 ENCOUNTER — Other Ambulatory Visit: Payer: Self-pay | Admitting: Family Medicine

## 2014-02-26 ENCOUNTER — Other Ambulatory Visit: Payer: Self-pay

## 2014-02-26 NOTE — Telephone Encounter (Signed)
Last seen 06/19/13 and filled 02/02/15 # 30   Please advise     KP

## 2014-02-26 NOTE — Telephone Encounter (Signed)
Last seen 11/26/13 and filled 01/27/14 #60  Please advise      KP

## 2014-02-26 NOTE — Telephone Encounter (Signed)
Refill x1 

## 2014-03-03 MED ORDER — TRAZODONE HCL 50 MG PO TABS: ORAL_TABLET | ORAL | Status: DC

## 2014-03-03 NOTE — Addendum Note (Signed)
Addended by: Ewing Schlein on: 03/03/2014 03:49 PM   Modules accepted: Orders

## 2014-03-10 ENCOUNTER — Other Ambulatory Visit: Payer: Self-pay | Admitting: Family Medicine

## 2014-03-26 ENCOUNTER — Other Ambulatory Visit: Payer: Self-pay | Admitting: Family Medicine

## 2014-03-26 NOTE — Telephone Encounter (Signed)
Clonazepam  Last filled:  02/18/14 Amt: 60, 0 refills  Trazodone Last filled:  03/03/14 Amt: 60, 0 refills  Last office visit:  06/19/13 UDS---03/19/13--HIGH risk--per Dr. Etter Sjogren no more Klonopin 03/28/13 Contract on file   Please advise.

## 2014-03-26 NOTE — Telephone Encounter (Signed)
Invalid number listed.  Unable to reach patient.

## 2014-03-27 ENCOUNTER — Other Ambulatory Visit: Payer: Self-pay

## 2014-03-27 MED ORDER — SERTRALINE HCL 100 MG PO TABS: 100.0000 mg | ORAL_TABLET | Freq: Every day | ORAL | Status: DC

## 2014-03-27 NOTE — Addendum Note (Signed)
Addended by: Ewing Schlein on: 03/27/2014 09:26 AM   Modules accepted: Orders

## 2014-03-27 NOTE — Telephone Encounter (Signed)
Klonopin requested   Last seen with Dr.Lowne was 06/19/13 and filled 02/18/14 #60 NO UDS    Please advise     KP

## 2014-04-08 ENCOUNTER — Telehealth: Payer: Self-pay | Admitting: Family Medicine

## 2014-04-08 NOTE — Telephone Encounter (Signed)
Caller name: Sonnie Relation to pt: self Call back number:  [phone] Pharmacy:  [facility] in [place]  Reason for call:   Patient requesting a refill of seroquel

## 2014-04-09 ENCOUNTER — Other Ambulatory Visit: Payer: Self-pay | Admitting: Family Medicine

## 2014-04-09 MED ORDER — QUETIAPINE FUMARATE 300 MG PO TABS: 300.0000 mg | ORAL_TABLET | Freq: Two times a day (BID) | ORAL | Status: DC

## 2014-04-09 NOTE — Telephone Encounter (Signed)
Last seen 06/19/13  01/12/14 #60 with 2 rf.   Please advise     KP

## 2014-04-09 NOTE — Telephone Encounter (Signed)
Refill x1--- pt needs psych

## 2014-04-13 NOTE — Telephone Encounter (Signed)
Last seen with PCP 06/19/13 and filled 03/10/14 #30.   Please advise     KP

## 2014-04-17 ENCOUNTER — Telehealth: Payer: Self-pay | Admitting: *Deleted

## 2014-04-17 NOTE — Telephone Encounter (Signed)
Received medical record request for most recent labs from [facility] with a signed consent from patient. Requested records faxed to [phone] successfully. JG//CMA

## 2014-04-25 ENCOUNTER — Emergency Department (HOSPITAL_BASED_OUTPATIENT_CLINIC_OR_DEPARTMENT_OTHER)
Admission: EM | Admit: 2014-04-25 | Discharge: 2014-04-26 | Disposition: A | Discharge status: Home / Self Care | Payer: BLUE CROSS/BLUE SHIELD | Attending: Emergency Medicine | Admitting: Emergency Medicine

## 2014-04-25 ENCOUNTER — Encounter (HOSPITAL_BASED_OUTPATIENT_CLINIC_OR_DEPARTMENT_OTHER): Payer: Self-pay | Admitting: Emergency Medicine

## 2014-04-25 DIAGNOSIS — K589 Irritable bowel syndrome without diarrhea: Secondary | ICD-10-CM | POA: Diagnosis not present

## 2014-04-25 DIAGNOSIS — M1990 Unspecified osteoarthritis, unspecified site: Secondary | ICD-10-CM | POA: Diagnosis not present

## 2014-04-25 DIAGNOSIS — E785 Hyperlipidemia, unspecified: Secondary | ICD-10-CM | POA: Diagnosis not present

## 2014-04-25 DIAGNOSIS — Z8541 Personal history of malignant neoplasm of cervix uteri: Secondary | ICD-10-CM | POA: Insufficient documentation

## 2014-04-25 DIAGNOSIS — Z7951 Long term (current) use of inhaled steroids: Secondary | ICD-10-CM | POA: Insufficient documentation

## 2014-04-25 DIAGNOSIS — R05 Cough: Secondary | ICD-10-CM | POA: Insufficient documentation

## 2014-04-25 DIAGNOSIS — Z950 Presence of cardiac pacemaker: Secondary | ICD-10-CM | POA: Diagnosis not present

## 2014-04-25 DIAGNOSIS — I10 Essential (primary) hypertension: Secondary | ICD-10-CM | POA: Insufficient documentation

## 2014-04-25 DIAGNOSIS — Z79899 Other long term (current) drug therapy: Secondary | ICD-10-CM | POA: Diagnosis not present

## 2014-04-25 DIAGNOSIS — R1032 Left lower quadrant pain: Secondary | ICD-10-CM | POA: Insufficient documentation

## 2014-04-25 DIAGNOSIS — D649 Anemia, unspecified: Secondary | ICD-10-CM | POA: Diagnosis not present

## 2014-04-25 DIAGNOSIS — Z8742 Personal history of other diseases of the female genital tract: Secondary | ICD-10-CM | POA: Insufficient documentation

## 2014-04-25 DIAGNOSIS — G8929 Other chronic pain: Secondary | ICD-10-CM | POA: Diagnosis not present

## 2014-04-25 DIAGNOSIS — F319 Bipolar disorder, unspecified: Secondary | ICD-10-CM | POA: Diagnosis not present

## 2014-04-25 DIAGNOSIS — F419 Anxiety disorder, unspecified: Secondary | ICD-10-CM | POA: Diagnosis not present

## 2014-04-25 DIAGNOSIS — R6883 Chills (without fever): Secondary | ICD-10-CM | POA: Diagnosis not present

## 2014-04-25 DIAGNOSIS — R1031 Right lower quadrant pain: Secondary | ICD-10-CM | POA: Diagnosis not present

## 2014-04-25 DIAGNOSIS — M545 Low back pain, unspecified: Secondary | ICD-10-CM

## 2014-04-25 DIAGNOSIS — Z792 Long term (current) use of antibiotics: Secondary | ICD-10-CM | POA: Insufficient documentation

## 2014-04-25 DIAGNOSIS — M797 Fibromyalgia: Secondary | ICD-10-CM | POA: Insufficient documentation

## 2014-04-25 DIAGNOSIS — Z86010 Personal history of colonic polyps: Secondary | ICD-10-CM | POA: Insufficient documentation

## 2014-04-25 DIAGNOSIS — G40909 Epilepsy, unspecified, not intractable, without status epilepticus: Secondary | ICD-10-CM | POA: Diagnosis not present

## 2014-04-25 DIAGNOSIS — R079 Chest pain, unspecified: Secondary | ICD-10-CM | POA: Insufficient documentation

## 2014-04-25 DIAGNOSIS — R112 Nausea with vomiting, unspecified: Secondary | ICD-10-CM | POA: Insufficient documentation

## 2014-04-25 DIAGNOSIS — G43909 Migraine, unspecified, not intractable, without status migrainosus: Secondary | ICD-10-CM | POA: Insufficient documentation

## 2014-04-25 DIAGNOSIS — K219 Gastro-esophageal reflux disease without esophagitis: Secondary | ICD-10-CM | POA: Diagnosis not present

## 2014-04-25 DIAGNOSIS — J441 Chronic obstructive pulmonary disease with (acute) exacerbation: Secondary | ICD-10-CM | POA: Diagnosis not present

## 2014-04-25 MED ORDER — SODIUM CHLORIDE 0.9 % IV BOLUS (SEPSIS)
500.0000 mL | Freq: Once | INTRAVENOUS | Status: AC
  Administered 2014-04-25: 500 mL via INTRAVENOUS

## 2014-04-25 MED ORDER — ONDANSETRON HCL 4 MG/2ML IJ SOLN
4.0000 mg | Freq: Once | INTRAMUSCULAR | Status: AC
  Administered 2014-04-25: 4 mg via INTRAVENOUS
  Filled 2014-04-25: qty 2

## 2014-04-25 MED ORDER — PROMETHAZINE HCL 25 MG PO TABS: 25.0000 mg | ORAL_TABLET | Freq: Four times a day (QID) | ORAL | Status: DC | PRN

## 2014-04-25 MED ORDER — HYDROCODONE-ACETAMINOPHEN 5-325 MG PO TABS: 1.0000 | ORAL_TABLET | Freq: Four times a day (QID) | ORAL | Status: DC | PRN

## 2014-04-25 MED ORDER — SODIUM CHLORIDE 0.9 % IV SOLN
INTRAVENOUS | Status: DC
  Administered 2014-04-25: via INTRAVENOUS

## 2014-04-25 MED ORDER — HYDROMORPHONE HCL 1 MG/ML IJ SOLN
1.0000 mg | Freq: Once | INTRAMUSCULAR | Status: AC
  Administered 2014-04-25: 1 mg via INTRAVENOUS
  Filled 2014-04-25: qty 1

## 2014-04-25 NOTE — ED Notes (Signed)
Pt presents to ED with complaints of lower back pain  And runs down into her legs since yesterday and has a history of same.

## 2014-04-25 NOTE — ED Provider Notes (Signed)
CSN: [id number]     Arrival date & time 04/25/14  2054 History  This chart was scribed for Fredia Sorrow, MD by Randa Evens, ED Scribe. This patient was seen in room MH05/MH05 and the patient's care was started at 10:04 PM.     Chief Complaint  Patient presents with  . Back Pain   Patient is a 39 y.o. female presenting with back pain. The history is provided by the patient. No language interpreter was used.  Back Pain Location:  Lumbar spine Quality: sharp. Radiates to:  L posterior upper leg and R posterior upper leg Pain severity:  Moderate Timing:  Constant Chronicity:  Recurrent Relieved by:  None tried Ineffective treatments:  Ibuprofen and OTC medications Associated symptoms: abdominal pain, chest pain and headaches   Associated symptoms: no fever and no numbness    HPI Comments: [name] is a 39 y.o. female who presents to the Emergency Department complaining of sharp low back pain onset 1 day prior. Pt states that the pain is radiating into her legs down to her ankles. Pt states she has been nauseated and vomiting x3 today. Pt states she has tried [employer] back and body, tylenol, and aleve with no relief. Denies numbness.   Past Medical History  Diagnosis Date  . 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 SLEEP APNEA 08/31/2007  . 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 DEPRESSION 08/31/2007  . ARTHRITIS 08/31/2007  . HYPERTENSION 08/31/2007  . Chronic interstitial cystitis 03/11/2009  . IBS 03/11/2009  . GERD 02/05/2009  . BRONCHITIS, RECURRENT 08/23/2009    Asthmatic Bronchitis-Dr. Melvyn Novas.....-HFA 75% 12/04/2008>75% 02/05/2009>75% 08/04/2009 -PFT's 01/04/2009 2.56 (86%) ratio 75, no resp to B2 and DLC0 67% > 80 after correction   . Anal fissure 03/11/2009  . RECTAL BLEEDING 03/11/2009  . COLONIC POLYPS, HX OF 07/25/2006    ADENOMATOUS POLYP  . Migraine headache   . Chronic pain   . Chronic nausea   . COPD (chronic obstructive pulmonary disease)    . Asthma   . Anxiety and depression   . Uterine cyst   . Endometriosis   . Bowel obstruction   . Colon polyps   . Internal hemorrhoids   . Cancer     cervical cancer  . FIBROMYALGIA 08/31/2007  . Arthritis   . Anemia   . Anxiety   . Hyperlipidemia   . Seizures     been about 1 year since last seisure per pt  . Bipolar 1 disorder    Past Surgical History  Procedure Laterality Date  . Cholecystectomy    . Abdominal hysterectomy    . Pacemaker insertion      in hip for interstitial cystitis  . Bladder surgery      stimulator placed and stretching   . Interstitial cystitis    . Bladder stretching x6    . Replaced bladder pacemaker    . Removal of uterine cyst and scrapped uterus    . Colonoscopy     Family History  Problem Relation Age of Onset  . Heart disease Father   . Asthma Maternal Grandmother   . Emphysema Maternal Grandfather   . Cancer Maternal Grandfather     Lung Cancer  . Cancer Other     Lung Cancer-Aunt  . Colon cancer Neg Hx   . Esophageal cancer Neg Hx   . Rectal cancer Neg Hx   . Stomach cancer Neg Hx    History  Substance Use Topics  .  Smoking status: Current Every Day Smoker -- 1.00 packs/day for 14 years    Types: Cigarettes  . Smokeless tobacco: Never Used  . Alcohol Use: Yes     Comment: occasional wine    OB History    No data available     Review of Systems  Constitutional: Positive for chills. Negative for fever.  HENT: Negative for rhinorrhea and sore throat.   Eyes: Negative for visual disturbance.  Respiratory: Positive for cough and shortness of breath.   Cardiovascular: Positive for chest pain. Negative for leg swelling.  Gastrointestinal: Positive for nausea, vomiting and abdominal pain. Negative for diarrhea.  Musculoskeletal: Positive for back pain. Negative for neck pain.  Skin: Negative for rash.  Neurological: Positive for headaches. Negative for numbness.  Psychiatric/Behavioral: Negative for confusion.  All other  systems reviewed and are negative.     Allergies  Abilify; Metoclopramide hcl; Tramadol; Ambien; Eszopiclone; Amitriptyline; Metoclopramide; Varenicline; Demerol; Emetrol; Morphine and related; and Propoxyphene n-acetaminophen  Home Medications   Prior to Admission medications   Medication Sig Start Date End Date Taking? Authorizing Provider  albuterol (VENTOLIN HFA) 108 (90 BASE) MCG/ACT inhaler Inhale 2 puffs into the lungs every 4 (four) hours as needed for wheezing or shortness of breath. 11/26/13   Midge Minium, MD  benzonatate (TESSALON) 200 MG capsule Take 1 capsule (200 mg total) by mouth 3 (three) times daily as needed for cough. 11/27/13   Rosalita Chessman, DO  budesonide-formoterol (SYMBICORT) 160-4.5 MCG/ACT inhaler INHALE TWO PUFFS BY MOUTH TWICE DAILY 11/26/13   Midge Minium, MD  CALCIUM PO Take 1 tablet by mouth 2 (two) times daily.    Historical Provider, MD  cetirizine (ZYRTEC) 10 MG tablet Take 10 mg by mouth at bedtime as needed for allergies.    Historical Provider, MD  Cholecalciferol (VITAMIN D) 2000 UNITS CAPS Take 1 capsule by mouth daily.    Historical Provider, MD  clonazePAM (KLONOPIN) 1 MG tablet TAKE ONE TABLET BY MOUTH TWICE DAILY 02/18/14   Rosalita Chessman, DO  dicyclomine (BENTYL) 20 MG tablet Take 20 mg by mouth. 09/17/12   Historical Provider, MD  docusate sodium (COLACE) 100 MG capsule Take 100 mg by mouth. 03/31/13   Historical Provider, MD  doxazosin (CARDURA) 2 MG tablet TAKE ONE TABLET BY MOUTH AT BEDTIME 02/03/14   Rosalita Chessman, DO  estradiol (ESTRACE) 1 MG tablet Take 2 mg by mouth daily.    Historical Provider, MD  fluconazole (DIFLUCAN) 150 MG tablet Take 1 tablet (150 mg total) by mouth once. May repeat in 3 days if needed 11/26/13   Midge Minium, MD  folic acid (FOLVITE) 1 MG tablet Take 1 mg by mouth every morning.     Historical Provider, MD  guaiFENesin-codeine (CHERATUSSIN AC) 100-10 MG/5ML syrup TAKE ONE TEASPOONFUL BY MOUTH THREE  TIMES DAILY AS NEEDED 11/26/13   Midge Minium, MD  HYDROcodone-acetaminophen [facility]) 10-325 MG per tablet Take 1 tablet by mouth. 06/18/13   Historical Provider, MD  HYDROcodone-acetaminophen (NORCO/VICODIN) 5-325 MG per tablet Take 1-2 tablets by mouth every 6 (six) hours as needed for moderate pain. 09/10/13   Fredia Sorrow, MD  hyoscyamine (LEVSIN, ANASPAZ) 0.125 MG tablet Take 0.125 mg by mouth every 4 (four) hours as needed for bladder spasms.    Historical Provider, MD  omeprazole (PRILOSEC) 40 MG capsule TAKE ONE CAPSULE BY MOUTH ONCE DAILY 01/08/14   Rosalita Chessman, DO  phenazopyridine (PYRIDIUM) 200 MG tablet Take 200 mg  by mouth 3 (three) times daily as needed for pain.    Historical Provider, MD  promethazine (PHENERGAN) 25 MG tablet TAKE ONE TABLET BY MOUTH EVERY 8 HOURS AS NEEDED FOR NAUSEA AND VOMITING 04/13/14   Rosalita Chessman, DO  promethazine-dextromethorphan (PROMETHAZINE-DM) 6.25-15 MG/5ML syrup Take 5 mLs by mouth 4 (four) times daily as needed for cough. 12/03/13   Rosalita Chessman, DO  QUEtiapine (SEROQUEL) 300 MG tablet Take 1 tablet (300 mg total) by mouth 2 (two) times daily. Follow up needed asap 04/09/14   Rosalita Chessman, DO  sertraline (ZOLOFT) 100 MG tablet Take 1 tablet (100 mg total) by mouth daily. 03/27/14   Rosalita Chessman, DO  sulfamethoxazole-trimethoprim (BACTRIM DS) 800-160 MG per tablet Take 1 tablet by mouth 2 (two) times daily. 11/26/13   Midge Minium, MD  temazepam (RESTORIL) 15 MG capsule TAKE ONE CAPSULE BY MOUTH AT BEDTIME AS NEEDED FOR SLEEP 02/26/14   Rosalita Chessman, DO  traZODone (DESYREL) 50 MG tablet TAKE TWO TABLETS BY MOUTH AT BEDTIME 03/03/14   Rosalita Chessman, DO  vitamin B-12 (CYANOCOBALAMIN) 1000 MCG tablet Take 1,000 mcg by mouth.    Historical Provider, MD   BP 125/93 mmHg  Pulse 104  Temp(Src) 98.2 F (36.8 C) (Oral)  Resp 24  Ht 5\' 4"  (1.626 m)  Wt 115 lb (52.164 kg)  BMI 19.73 kg/m2  SpO2 97%   Physical Exam  Constitutional: She  is oriented to person, place, and time. She appears well-developed and well-nourished. No distress.  HENT:  Head: Normocephalic and atraumatic.  Eyes: Conjunctivae and EOM are normal. Pupils are equal, round, and reactive to light. No scleral icterus.  Neck: Neck supple. No tracheal deviation present.  Cardiovascular: Normal rate and regular rhythm.   Pulmonary/Chest: Effort normal and breath sounds normal. No respiratory distress.  Abdominal: Soft. Bowel sounds are normal. There is tenderness in the right lower quadrant and left lower quadrant. There is no guarding.  Musculoskeletal: Normal range of motion.  Neurological: She is alert and oriented to person, place, and time.  Skin: Skin is warm and dry.  Psychiatric: She has a normal mood and affect. Her behavior is normal.  Nursing note and vitals reviewed.   ED Course  Procedures (including critical care time) DIAGNOSTIC STUDIES: Oxygen Saturation is 97% on RA, normal by my interpretation.    COORDINATION OF CARE: 10:33 PM-Discussed treatment plan with pt at bedside and pt agreed to plan.     Labs Review Labs Reviewed - No data to display  Imaging Review No results found.   EKG Interpretation None      MDM   Final diagnoses:  Bilateral low back pain without sciatica   Patient followed primary care here at [facility]. Patient with a history of chronic back pain and fibromyalgia. Patient with exacerbation of this back pain starting yesterday. No injury. Patient does have pain radiating into both legs but has no neuro deficits lower extremities at all. No trouble with incontinence. Patient with pain control here in the emergency department. Can be discharged home on pain medicine and follow-up with her doctor.   I personally performed the services described in this documentation, which was scribed in my presence. The recorded information has been reviewed and is accurate.       Fredia Sorrow,  MD 04/25/14 [phone]

## 2014-04-25 NOTE — Discharge Instructions (Signed)
Rest and follow-up with your doctor in the next few days. Take pain medicine as directed. Return for any new or worse symptoms.

## 2014-05-11 ENCOUNTER — Other Ambulatory Visit: Payer: Self-pay | Admitting: Family Medicine

## 2014-05-11 NOTE — Telephone Encounter (Signed)
Last seen 06/19/13 and filled Seroquel 04/09/13 #60 Cardura filled 02/03/14 #30 with 2 refills.   Please advise if these refills are appropriate.      KP

## 2014-05-11 NOTE — Telephone Encounter (Signed)
Message left and letter mailed to schedule an Office visit.      KP

## 2014-05-12 ENCOUNTER — Encounter (HOSPITAL_COMMUNITY): Payer: Self-pay | Admitting: *Deleted

## 2014-05-12 ENCOUNTER — Emergency Department (HOSPITAL_COMMUNITY)
Admission: EM | Admit: 2014-05-12 | Discharge: 2014-05-12 | Disposition: A | Discharge status: Home / Self Care | Payer: BLUE CROSS/BLUE SHIELD | Attending: Emergency Medicine | Admitting: Emergency Medicine

## 2014-05-12 DIAGNOSIS — J449 Chronic obstructive pulmonary disease, unspecified: Secondary | ICD-10-CM | POA: Diagnosis not present

## 2014-05-12 DIAGNOSIS — F319 Bipolar disorder, unspecified: Secondary | ICD-10-CM | POA: Insufficient documentation

## 2014-05-12 DIAGNOSIS — Z79899 Other long term (current) drug therapy: Secondary | ICD-10-CM | POA: Insufficient documentation

## 2014-05-12 DIAGNOSIS — I10 Essential (primary) hypertension: Secondary | ICD-10-CM | POA: Diagnosis not present

## 2014-05-12 DIAGNOSIS — E785 Hyperlipidemia, unspecified: Secondary | ICD-10-CM | POA: Diagnosis not present

## 2014-05-12 DIAGNOSIS — Z862 Personal history of diseases of the blood and blood-forming organs and certain disorders involving the immune mechanism: Secondary | ICD-10-CM | POA: Insufficient documentation

## 2014-05-12 DIAGNOSIS — M1990 Unspecified osteoarthritis, unspecified site: Secondary | ICD-10-CM | POA: Insufficient documentation

## 2014-05-12 DIAGNOSIS — K219 Gastro-esophageal reflux disease without esophagitis: Secondary | ICD-10-CM | POA: Insufficient documentation

## 2014-05-12 DIAGNOSIS — Z8541 Personal history of malignant neoplasm of cervix uteri: Secondary | ICD-10-CM | POA: Diagnosis not present

## 2014-05-12 DIAGNOSIS — Z720 Tobacco use: Secondary | ICD-10-CM | POA: Insufficient documentation

## 2014-05-12 DIAGNOSIS — R5383 Other fatigue: Secondary | ICD-10-CM | POA: Diagnosis present

## 2014-05-12 LAB — CBC WITH DIFFERENTIAL/PLATELET
Basophils Absolute: 0 10*3/uL (ref 0.0–0.1)
Basophils Relative: 0 % (ref 0–1)
Eosinophils Absolute: 0 10*3/uL (ref 0.0–0.7)
Eosinophils Relative: 0 % (ref 0–5)
HCT: 40.3 % (ref 36.0–46.0)
Hemoglobin: 13.8 g/dL (ref 12.0–15.0)
Lymphocytes Relative: 24 % (ref 12–46)
Lymphs Abs: 2.7 10*3/uL (ref 0.7–4.0)
MCH: 32.5 pg (ref 26.0–34.0)
MCHC: 34.2 g/dL (ref 30.0–36.0)
MCV: 95 fL (ref 78.0–100.0)
Monocytes Absolute: 0.4 10*3/uL (ref 0.1–1.0)
Monocytes Relative: 3 % (ref 3–12)
Neutro Abs: 8.3 10*3/uL — ABNORMAL HIGH (ref 1.7–7.7)
Neutrophils Relative %: 73 % (ref 43–77)
Platelets: 369 10*3/uL (ref 150–400)
RBC: 4.24 MIL/uL (ref 3.87–5.11)
RDW: 12.3 % (ref 11.5–15.5)
WBC: 11.4 10*3/uL — ABNORMAL HIGH (ref 4.0–10.5)

## 2014-05-12 LAB — BASIC METABOLIC PANEL
Anion gap: 8 (ref 5–15)
BUN: 10 mg/dL (ref 6–23)
CO2: 24 mmol/L (ref 19–32)
Calcium: 10 mg/dL (ref 8.4–10.5)
Chloride: 105 mmol/L (ref 96–112)
Creatinine, Ser: 0.75 mg/dL (ref 0.50–1.10)
GFR calc Af Amer: >90 mL/min (ref >90)
GFR calc non Af Amer: >90 mL/min (ref >90)
Glucose, Bld: 104 mg/dL — ABNORMAL HIGH (ref 70–99)
Potassium: 3.5 mmol/L (ref 3.5–5.1)
Sodium: 137 mmol/L (ref 135–145)

## 2014-05-12 LAB — RAPID URINE DRUG SCREEN, HOSP PERFORMED
Amphetamines: NOT DETECTED
Barbiturates: NOT DETECTED
Benzodiazepines: POSITIVE — AB
Cocaine: NOT DETECTED
Opiates: POSITIVE — AB
Tetrahydrocannabinol: NOT DETECTED

## 2014-05-12 LAB — ETHANOL: Alcohol, Ethyl (B): <5 mg/dL (ref 0–9)

## 2014-05-12 MED ORDER — ONDANSETRON HCL 4 MG/2ML IJ SOLN
4.0000 mg | Freq: Once | INTRAMUSCULAR | Status: AC
  Administered 2014-05-12: 4 mg via INTRAVENOUS
  Filled 2014-05-12: qty 2

## 2014-05-12 MED ORDER — KETOROLAC TROMETHAMINE 30 MG/ML IJ SOLN
30.0000 mg | Freq: Once | INTRAMUSCULAR | Status: AC
  Administered 2014-05-12: 30 mg via INTRAVENOUS
  Filled 2014-05-12: qty 1

## 2014-05-12 NOTE — ED Notes (Signed)
MD at bedside. 

## 2014-05-12 NOTE — ED Notes (Signed)
Pt has weak bilateral grip strengths and can move her toes.

## 2014-05-12 NOTE — [facility] Counselor (Signed)
Spoke with RN Ander Purpura and made her aware of pt disposition. She will tell Dr. Lacinda Axon that pt is psychiatrically cleared.   Bedelia Person, M.S., LPCA, [place], [facility] Licensed Professional Counselor Associate  Triage Specialist  [facility]  Therapeutic Triage Services Phone: [phone] Fax: [phone]

## 2014-05-12 NOTE — B[facility] Counselor (Signed)
Per Manus Gunning NP pt does not meet inpatient criteria. It is recommended she follow up with outpatient provider at [facility] to get back on her psychiatric medications. She may want to talk to her doctor about some less expensive medication options. Denies SI, HI and A/V.   [name], M.S., LPCA, [facility] Licensed Professional Counselor Associate  Triage Specialist  [facility]  Therapeutic Triage Services Phone: [phone] Fax: [phone]

## 2014-05-12 NOTE — ED Notes (Signed)
Pt is now making complete sentences and moving her arms.

## 2014-05-12 NOTE — ED Notes (Signed)
Lab made aware pt is done with TTS consult and they can now draw labs.
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
## 2014-05-12 NOTE — ED Notes (Signed)
Pt undergoing TTS consult now.

## 2014-05-12 NOTE — ED Notes (Signed)
Pts mother called 63. Pt was found this morning by mother. Pt is responding to speech and pain and opens eye but pt is not responding verbally. Mother states this is abnormal for patient. Pt is answering questions by blinking her eyes. Per EMS, pt has not been taking Bipolar and anxiety medications. Only pain is in pts. Chest. NAD noted at this time.

## 2014-05-12 NOTE — Discharge Instructions (Signed)
Follow up at [facility]

## 2014-05-12 NOTE — ED Notes (Signed)
Pt is now responding by nodding her head yes and no. Pt nods that she stopped taking her bipolar medication within the last week. NAD noted.

## 2014-05-12 NOTE — ED Provider Notes (Signed)
CSN: [id number]     Arrival date & time 05/12/14  0915 History  This chart was scribed for Nat Christen, MD by Lowella Petties, ED Scribe. The patient was seen in room [facility]. Patient's care was started at 9:37 AM.   Chief Complaint  Patient presents with  . Fatigue   The history is provided by the patient and a relative. The history is limited by the condition of the patient. No language interpreter was used.    Level 5 Caveat: Inability to speak HPI Comments: [name] is a 39 y.o. female with a history of COPD and Bipolar Disorder who was brought by EMS to the Emergency Department complaining of fatigue. Her mother states that she found her unresponsive on the couch after receiving a call from her stating that she needed help. Her mother states that this has happened before. She states that she is able to swallow. Her mother states that she takes Seroquel for Bipolar Disorder. Her mother states that she has a device in her hip to aid with her bladder function. No prodromal illnesses. Patient has been taking opiates lately for chronic pain issues. She also takes benzodiazepines  Past Medical History  Diagnosis Date  . 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 SLEEP APNEA 08/31/2007  . 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 DEPRESSION 08/31/2007  . ARTHRITIS 08/31/2007  . HYPERTENSION 08/31/2007  . Chronic interstitial cystitis 03/11/2009  . IBS 03/11/2009  . GERD 02/05/2009  . BRONCHITIS, RECURRENT 08/23/2009    Asthmatic Bronchitis-Dr. Melvyn Novas.....-HFA 75% 12/04/2008>75% 02/05/2009>75% 08/04/2009 -PFT's 01/04/2009 2.56 (86%) ratio 75, no resp to B2 and DLC0 67% > 80 after correction   . Anal fissure 03/11/2009  . RECTAL BLEEDING 03/11/2009  . COLONIC POLYPS, HX OF 07/25/2006    ADENOMATOUS POLYP  . Migraine headache   . Chronic pain   . Chronic nausea   . COPD (chronic obstructive pulmonary disease)   . Asthma   . Anxiety and depression   . Uterine cyst   .  Endometriosis   . Bowel obstruction   . Colon polyps   . Internal hemorrhoids   . Cancer     cervical cancer  . FIBROMYALGIA 08/31/2007  . Arthritis   . Anemia   . Anxiety   . Hyperlipidemia   . Seizures     been about 1 year since last seisure per pt  . Bipolar 1 disorder    Past Surgical History  Procedure Laterality Date  . Cholecystectomy    . Abdominal hysterectomy    . Pacemaker insertion      in hip for interstitial cystitis  . Bladder surgery      stimulator placed and stretching   . Interstitial cystitis    . Bladder stretching x6    . Replaced bladder pacemaker    . Removal of uterine cyst and scrapped uterus    . Colonoscopy     Family History  Problem Relation Age of Onset  . Heart disease Father   . Asthma Maternal Grandmother   . Emphysema Maternal Grandfather   . Cancer Maternal Grandfather     Lung Cancer  . Cancer Other     Lung Cancer-Aunt  . Colon cancer Neg Hx   . Esophageal cancer Neg Hx   . Rectal cancer Neg Hx   . Stomach cancer Neg Hx    History  Substance Use Topics  . Smoking status: Current Every Day Smoker -- 1.00 packs/day for 14  years    Types: Cigarettes  . Smokeless tobacco: Never Used  . Alcohol Use: Yes     Comment: occasional wine    OB History    No data available     Review of Systems  Unable to perform ROS Level 5 Caveat: Inability to speak  Allergies  Abilify; Metoclopramide hcl; Tramadol; Ambien; Eszopiclone; Amitriptyline; Metoclopramide; Varenicline; Buprenorphine hcl; Demerol; Emetrol; Morphine and related; and Propoxyphene n-acetaminophen  Home Medications   Prior to Admission medications   Medication Sig Start Date End Date Taking? Authorizing Provider  albuterol (VENTOLIN HFA) 108 (90 BASE) MCG/ACT inhaler Inhale 2 puffs into the lungs every 4 (four) hours as needed for wheezing or shortness of breath. 11/26/13  Yes Midge Minium, MD  budesonide-formoterol (SYMBICORT) 160-4.5 MCG/ACT inhaler INHALE TWO  PUFFS BY MOUTH TWICE DAILY 11/26/13  Yes Midge Minium, MD  doxazosin (CARDURA) 2 MG tablet TAKE ONE TABLET BY MOUTH AT BEDTIME 05/11/14  Yes Rosalita Chessman, DO  HYDROcodone-acetaminophen (NORCO/VICODIN) 5-325 MG per tablet Take 1-2 tablets by mouth every 6 (six) hours as needed for moderate pain. 04/25/14  Yes Fredia Sorrow, MD  hyoscyamine (LEVSIN, ANASPAZ) 0.125 MG tablet Take 0.125 mg by mouth every 4 (four) hours as needed for bladder spasms.   Yes Historical Provider, MD  Multiple Vitamin (MULTIVITAMIN WITH MINERALS) TABS tablet Take 1 tablet by mouth daily.   Yes Historical Provider, MD  omeprazole (PRILOSEC) 40 MG capsule TAKE ONE CAPSULE BY MOUTH ONCE DAILY 01/08/14  Yes Alferd Apa Lowne, DO  promethazine (PHENERGAN) 25 MG tablet Take 1 tablet (25 mg total) by mouth every 6 (six) hours as needed for nausea or vomiting. 04/25/14  Yes Fredia Sorrow, MD  QUEtiapine (SEROQUEL) 300 MG tablet TAKE ONE TABLET BY MOUTH TWICE DAILY, FOLLOW UP NEEDED ASAP 05/11/14  Yes Alferd Apa Lowne, DO  sertraline (ZOLOFT) 100 MG tablet Take 1 tablet (100 mg total) by mouth daily. 03/27/14  Yes Yvonne R Lowne, DO  benzonatate (TESSALON) 200 MG capsule Take 1 capsule (200 mg total) by mouth 3 (three) times daily as needed for cough. Patient not taking: Reported on 05/12/2014 11/27/13   Rosalita Chessman, DO  cetirizine (ZYRTEC) 10 MG tablet Take 10 mg by mouth at bedtime as needed for allergies.    Historical Provider, MD  clonazePAM (KLONOPIN) 1 MG tablet TAKE ONE TABLET BY MOUTH TWICE DAILY Patient not taking: Reported on 05/12/2014 02/18/14   Rosalita Chessman, DO  estradiol (ESTRACE) 1 MG tablet Take 2 mg by mouth daily.    Historical Provider, MD  fluconazole (DIFLUCAN) 150 MG tablet Take 1 tablet (150 mg total) by mouth once. May repeat in 3 days if needed Patient not taking: Reported on 05/12/2014 11/26/13   Midge Minium, MD  guaiFENesin-codeine [facility]) 100-10 MG/5ML syrup TAKE ONE TEASPOONFUL BY MOUTH THREE  TIMES DAILY AS NEEDED 11/26/13   Midge Minium, MD  HYDROcodone-acetaminophen (NORCO/VICODIN) 5-325 MG per tablet Take 1-2 tablets by mouth every 6 (six) hours as needed for moderate pain. Patient not taking: Reported on 05/12/2014 09/10/13   Fredia Sorrow, MD  promethazine (PHENERGAN) 25 MG tablet TAKE ONE TABLET BY MOUTH EVERY 8 HOURS AS NEEDED FOR NAUSEA AND VOMITING Patient not taking: Reported on 05/12/2014 04/13/14   Rosalita Chessman, DO  promethazine-dextromethorphan (PROMETHAZINE-DM) 6.25-15 MG/5ML syrup Take 5 mLs by mouth 4 (four) times daily as needed for cough. Patient not taking: Reported on 05/12/2014 12/03/13   Rosalita Chessman, DO  sulfamethoxazole-trimethoprim (BACTRIM DS) 800-160 MG per tablet Take 1 tablet by mouth 2 (two) times daily. Patient not taking: Reported on 05/12/2014 11/26/13   Midge Minium, MD  temazepam (RESTORIL) 15 MG capsule TAKE ONE CAPSULE BY MOUTH AT BEDTIME AS NEEDED FOR SLEEP Patient not taking: Reported on 05/12/2014 02/26/14   Rosalita Chessman, DO  traZODone (DESYREL) 50 MG tablet TAKE TWO TABLETS BY MOUTH AT BEDTIME Patient not taking: Reported on 05/12/2014 03/03/14   Rosalita Chessman, DO   Triage Vitals: BP 122/82 mmHg  Pulse 91  Temp(Src) 98 F (36.7 C) (Oral)  Resp 10  Ht 5\' 3"  (1.6 m)  Wt 120 lb (54.432 kg)  BMI 21.26 kg/m2  SpO2 100% Physical Exam  Constitutional: She is oriented to person, place, and time. She appears well-developed and well-nourished.  whispers answers to questions. Eyes were closed.  HENT:  Head: Normocephalic and atraumatic.  Mouth/Throat: Oropharynx is clear and moist.  Eyes: Conjunctivae and EOM are normal. Pupils are equal, round, and reactive to light.  Neck: Normal range of motion. Neck supple.  Cardiovascular: Normal rate, regular rhythm and normal heart sounds.   No murmur heard. Pulmonary/Chest: Effort normal and breath sounds normal.  Abdominal: Soft. Bowel sounds are normal.  Musculoskeletal: Normal range of motion.   Neurological: She is alert and oriented to person, place, and time.  Skin: Skin is warm and dry.  Psychiatric:  Flat affect  Nursing note and vitals reviewed.   ED Course  Procedures (including critical care time) DIAGNOSTIC STUDIES: Oxygen Saturation is 100% on room air, normal by my interpretation.    COORDINATION OF CARE: 9:46 AM-Discussed treatment plan with pt at bedside and pt agreed to plan.   Labs Review Labs Reviewed  BASIC METABOLIC PANEL - Abnormal; Notable for the following:    Glucose, Bld 104 (*)    All other components within normal limits  CBC WITH DIFFERENTIAL/PLATELET - Abnormal; Notable for the following:    WBC 11.4 (*)    Neutro Abs 8.3 (*)    All other components within normal limits  URINE RAPID DRUG SCREEN (HOSP PERFORMED) - Abnormal; Notable for the following:    Opiates POSITIVE (*)    Benzodiazepines POSITIVE (*)    All other components within normal limits  ETHANOL    Imaging Review No results found.   EKG Interpretation   Date/Time:  Tuesday May 12 2014 09:16:01 EST Ventricular Rate:  105 PR Interval:  129 QRS Duration: 61 QT Interval:  331 QTC Calculation: 437 R Axis:   75 Text Interpretation:  Sinus tachycardia Borderline repolarization  abnormality Confirmed by Zeinab Rodwell  MD, Noland Pizano ([id number]) on 05/12/2014 10:09:41 AM      MDM   Final diagnoses:  Bipolar 1 disorder   No gross neurological deficits. Drug screen positive for benzos and opiates. Behavioral health consult obtained. No homicidal or suicidal ideation. Patient has become more alert in the emergency department. Will discharge home with outpatient follow-up with [facility]  I personally performed the services described in this documentation, which was scribed in my presence. The recorded information has been reviewed and is accurate.     Nat Christen, MD 05/12/14 1515

## 2014-05-12 NOTE — ED Notes (Signed)
Kristen from TTS called and states that she is unable to be psychiatric cleared until pt is more alert.

## 2014-05-12 NOTE — ED Notes (Signed)
Kristen from [facility] states that pt has been cleared to go home by NP at facility. She suggests we give patient intructions for follow up at [facility].

## 2014-06-12 ENCOUNTER — Other Ambulatory Visit: Payer: Self-pay | Admitting: Family Medicine

## 2014-06-12 NOTE — Telephone Encounter (Signed)
Last seen 06/19/13. Pending apt 06/22/13.   Please advise     KP

## 2014-06-13 ENCOUNTER — Encounter: Payer: Self-pay | Admitting: Gastroenterology

## 2014-06-22 ENCOUNTER — Telehealth: Payer: Self-pay | Admitting: *Deleted

## 2014-06-22 NOTE — Telephone Encounter (Signed)
Patient unavailable at time of Pre-Visit Call Left message for patient to return call when available.

## 2014-06-23 ENCOUNTER — Emergency Department (HOSPITAL_BASED_OUTPATIENT_CLINIC_OR_DEPARTMENT_OTHER)
Admission: EM | Admit: 2014-06-23 | Discharge: 2014-06-23 | Disposition: A | Discharge status: Home / Self Care | Payer: BLUE CROSS/BLUE SHIELD | Attending: Emergency Medicine | Admitting: Emergency Medicine

## 2014-06-23 ENCOUNTER — Encounter (HOSPITAL_BASED_OUTPATIENT_CLINIC_OR_DEPARTMENT_OTHER): Payer: Self-pay | Admitting: *Deleted

## 2014-06-23 ENCOUNTER — Telehealth: Payer: Self-pay | Admitting: Family Medicine

## 2014-06-23 ENCOUNTER — Encounter: Payer: BLUE CROSS/BLUE SHIELD | Admitting: Family Medicine

## 2014-06-23 DIAGNOSIS — E785 Hyperlipidemia, unspecified: Secondary | ICD-10-CM | POA: Diagnosis not present

## 2014-06-23 DIAGNOSIS — G8929 Other chronic pain: Secondary | ICD-10-CM | POA: Insufficient documentation

## 2014-06-23 DIAGNOSIS — J449 Chronic obstructive pulmonary disease, unspecified: Secondary | ICD-10-CM | POA: Insufficient documentation

## 2014-06-23 DIAGNOSIS — Z720 Tobacco use: Secondary | ICD-10-CM | POA: Diagnosis not present

## 2014-06-23 DIAGNOSIS — Z3202 Encounter for pregnancy test, result negative: Secondary | ICD-10-CM | POA: Insufficient documentation

## 2014-06-23 DIAGNOSIS — M545 Low back pain, unspecified: Secondary | ICD-10-CM

## 2014-06-23 DIAGNOSIS — Z8679 Personal history of other diseases of the circulatory system: Secondary | ICD-10-CM | POA: Diagnosis not present

## 2014-06-23 DIAGNOSIS — N3010 Interstitial cystitis (chronic) without hematuria: Secondary | ICD-10-CM | POA: Diagnosis not present

## 2014-06-23 DIAGNOSIS — F419 Anxiety disorder, unspecified: Secondary | ICD-10-CM | POA: Diagnosis not present

## 2014-06-23 DIAGNOSIS — M1990 Unspecified osteoarthritis, unspecified site: Secondary | ICD-10-CM | POA: Diagnosis not present

## 2014-06-23 DIAGNOSIS — Z8541 Personal history of malignant neoplasm of cervix uteri: Secondary | ICD-10-CM | POA: Insufficient documentation

## 2014-06-23 DIAGNOSIS — Z8669 Personal history of other diseases of the nervous system and sense organs: Secondary | ICD-10-CM | POA: Diagnosis not present

## 2014-06-23 DIAGNOSIS — Z86010 Personal history of colonic polyps: Secondary | ICD-10-CM | POA: Diagnosis not present

## 2014-06-23 DIAGNOSIS — F329 Major depressive disorder, single episode, unspecified: Secondary | ICD-10-CM | POA: Diagnosis not present

## 2014-06-23 DIAGNOSIS — Z79899 Other long term (current) drug therapy: Secondary | ICD-10-CM | POA: Insufficient documentation

## 2014-06-23 DIAGNOSIS — K219 Gastro-esophageal reflux disease without esophagitis: Secondary | ICD-10-CM | POA: Diagnosis not present

## 2014-06-23 LAB — URINALYSIS, ROUTINE W REFLEX MICROSCOPIC
Bilirubin Urine: NEGATIVE
Glucose, UA: NEGATIVE mg/dL
Hgb urine dipstick: NEGATIVE
Ketones, ur: NEGATIVE mg/dL
Leukocytes, UA: NEGATIVE
Nitrite: NEGATIVE
Protein, ur: NEGATIVE mg/dL
Specific Gravity, Urine: 1.006 (ref 1.005–1.030)
Urobilinogen, UA: 0.2 mg/dL (ref 0.0–1.0)
pH: 7 (ref 5.0–8.0)

## 2014-06-23 LAB — PREGNANCY, URINE: Preg Test, Ur: NEGATIVE

## 2014-06-23 MED ORDER — HYDROMORPHONE HCL 1 MG/ML IJ SOLN
2.0000 mg | Freq: Once | INTRAMUSCULAR | Status: AC
  Administered 2014-06-23: 2 mg via INTRAMUSCULAR
  Filled 2014-06-23: qty 2

## 2014-06-23 MED ORDER — HYDROMORPHONE HCL 2 MG PO TABS: 2.0000 mg | ORAL_TABLET | Freq: Four times a day (QID) | ORAL | Status: DC | PRN

## 2014-06-23 MED ORDER — ONDANSETRON 4 MG PO TBDP
4.0000 mg | ORAL_TABLET | Freq: Three times a day (TID) | ORAL | Status: DC | PRN
Start: 2014-06-23 — End: 2014-08-10

## 2014-06-23 MED ORDER — ONDANSETRON 4 MG PO TBDP
4.0000 mg | ORAL_TABLET | Freq: Once | ORAL | Status: AC
  Administered 2014-06-23: 4 mg via ORAL
  Filled 2014-06-23: qty 1

## 2014-06-23 NOTE — Telephone Encounter (Signed)
Pt canceled CPE appointment for today due to pt being in the ED last night. Dr. Etter Sjogren stated do not charge patient

## 2014-06-23 NOTE — Discharge Instructions (Signed)
Back Pain, Adult Low back pain is very common. About 1 in 5 people have back pain.The cause of low back pain is rarely dangerous. The pain often gets better over time.About half of people with a sudden onset of back pain feel better in just 2 weeks. About 8 in 10 people feel better by 6 weeks.  CAUSES Some common causes of back pain include:  Strain of the muscles or ligaments supporting the spine.  Wear and tear (degeneration) of the spinal discs.  Arthritis.  Direct injury to the back. DIAGNOSIS Most of the time, the direct cause of low back pain is not known.However, back pain can be treated effectively even when the exact cause of the pain is unknown.Answering your caregiver's questions about your overall health and symptoms is one of the most accurate ways to make sure the cause of your pain is not dangerous. If your caregiver needs more information, he or she may order lab work or imaging tests (X-rays or MRIs).However, even if imaging tests show changes in your back, this usually does not require surgery. HOME CARE INSTRUCTIONS For many people, back pain returns.Since low back pain is rarely dangerous, it is often a condition that people can learn to [facility] their own.   Remain active. It is stressful on the back to sit or stand in one place. Do not sit, drive, or stand in one place for more than 30 minutes at a time. Take short walks on level surfaces as soon as pain allows.Try to increase the length of time you walk each day.  Do not stay in bed.Resting more than 1 or 2 days can delay your recovery.  Do not avoid exercise or work.Your body is made to move.It is not dangerous to be active, even though your back may hurt.Your back will likely heal faster if you return to being active before your pain is gone.  Pay attention to your body when you bend and lift. Many people have less discomfortwhen lifting if they bend their knees, keep the load close to their bodies,and  avoid twisting. Often, the most comfortable positions are those that put less stress on your recovering back.  Find a comfortable position to sleep. Use a firm mattress and lie on your side with your knees slightly bent. If you lie on your back, put a pillow under your knees.  Only take over-the-counter or prescription medicines as directed by your caregiver. Over-the-counter medicines to reduce pain and inflammation are often the most helpful.Your caregiver may prescribe muscle relaxant drugs.These medicines help dull your pain so you can more quickly return to your normal activities and healthy exercise.  Put ice on the injured area.  Put ice in a plastic bag.  Place a towel between your skin and the bag.  Leave the ice on for 15-20 minutes, 03-04 times a day for the first 2 to 3 days. After that, ice and heat may be alternated to reduce pain and spasms.  Ask your caregiver about trying back exercises and gentle massage. This may be of some benefit.  Avoid feeling anxious or stressed.Stress increases muscle tension and can worsen back pain.It is important to recognize when you are anxious or stressed and learn ways to manage it.Exercise is a great option. SEEK MEDICAL CARE IF:  You have pain that is not relieved with rest or medicine.  You have pain that does not improve in 1 week.  You have new symptoms.  You are generally not feeling well. SEEK  IMMEDIATE MEDICAL CARE IF:   You have pain that radiates from your back into your legs.  You develop new bowel or bladder control problems.  You have unusual weakness or numbness in your arms or legs.  You develop nausea or vomiting.  You develop abdominal pain.  You feel faint. Document Released: 03/27/2005 Document Revised: 09/26/2011 Document Reviewed: 07/29/2013 [facility] Patient Information 2015 [place], [place]. This information is not intended to replace advice given to you by your health care provider. Make sure you  discuss any questions you have with your health care provider.  Interstitial Cystitis Interstitial cystitis (IC) is a condition that results in discomfort or pain in the bladder and the surrounding pelvic region. The symptoms can be different from case to case and even in the same individual. People may experience:  Mild discomfort.  Pressure.  Tenderness.  Intense pain in the bladder and pelvic area. CAUSES  Because IC varies so much in symptoms and severity, people studying this disease believe it is not one but several diseases. Some caregivers use the term painful bladder syndrome (PBS) to describe cases with painful urinary symptoms. This may not meet the strictest definition of IC. The term IC / PBS includes all cases of urinary pain that cannot be connected to other causes, such as infection or urinary stones.  SYMPTOMS  Symptoms may include:  An urgent need to urinate.  A frequent need to urinate.  A combination of these symptoms. Pain may change in intensity as the bladder fills with urine or as it empties. Women's symptoms often get worse during menstruation. They may sometimes experience pain with vaginal intercourse. Some of the symptoms of IC / PBS seem like those of bacterial infection. Tests do not show infection. IC / PBS is far more common in women than in men.  DIAGNOSIS  The diagnosis of IC / PBS is based on:  Presence of pain related to the bladder, usually along with problems of frequency and urgency.  Not finding other diseases that could cause the symptoms.  Diagnostic tests that help rule out other diseases include:  Urinalysis.  Urine culture.  Cystoscopy.  Biopsy of the bladder wall.  Distension of the bladder under anesthesia.  Urine cytology.  Laboratory examination of prostate secretions. A biopsy is a tissue sample that can be looked at under a microscope. Samples of the bladder and urethra may be removed during a cystoscopy. A biopsy helps  rule out bladder cancer. TREATMENT  Scientists have not yet found a cure for IC / PBS. Patients with IC / PBS do not get better with antibiotic therapy. Caregivers cannot predict who will respond best to which treatment. Symptoms may disappear without explanation. Disappearing symptoms may coincide with an event such as a change in diet or treatment. Even when symptoms disappear, they may return after days, weeks, months, or years.  Because the causes of IC / PBS are unknown, current treatments are aimed at relieving symptoms. Many people are helped by one or a combination of the treatments. As researchers learn more about IC / PBS, the list of potential treatments will change. Patients should discuss their options with a caregiver. SURGERY  Surgery should be considered only if all available treatments have failed and the pain is disabling. Many approaches and techniques are used. Each approach has its own advantages and complications. Advantages and complications should be discussed with a urologist. Your caregiver may recommend consulting another urologist for a second opinion. Most caregivers are reluctant to  operate because the outcome is unpredictable. Some people still have symptoms after surgery.  People considering surgery should discuss the potential risks and benefits, side effects, and long- and short-term complications with their family, as well as with people who have already had the procedure. Surgery requires anesthesia, hospitalization, and in some cases weeks or months of recovery. As the complexity of the procedure increases, so do the chances for complications and for failure. HOME CARE INSTRUCTIONS   All drugs, even those sold over the counter, have side effects. Patients should always consult a caregiver before using any drug for an extended amount of time. Only take over-the-counter or prescription medicines for pain, discomfort, or fever as directed by your caregiver.  Many  patients feel that smoking makes their symptoms worse. How the by-products of tobacco that are excreted in the urine affect IC / PBS is unknown. Smoking is the major known cause of bladder cancer. One of the best things smokers can do for their bladder and their overall health is to quit.  Many patients feel that gentle stretching exercises help relieve IC / PBS symptoms.  Methods vary, but basically patients decide to empty their bladder at designated times and use relaxation techniques and distractions to keep to the schedule. Gradually, patients try to lengthen the time between scheduled voids. A diary in which to record voiding times is usually helpful in keeping track of progress. MAKE SURE YOU:   Understand these instructions.  Will watch your condition.  Will get help right away if you are not doing well or get worse. Document Released: 11/26/2003 Document Revised: 06/19/2011 Document Reviewed: 02/10/2008 [facility] Patient Information 2015 [place], [place]. This information is not intended to replace advice given to you by your health care provider. Make sure you discuss any questions you have with your health care provider.

## 2014-06-23 NOTE — ED Provider Notes (Signed)
TIME SEEN: 12:45 PM  CHIEF COMPLAINT: Back pain  HPI: Pt is a 39 y.o. female with history of hypertension, COPD, chronic interstitial cystitis who is followed with Dr. Amalia Hailey who has a bladder stimulator in place who presents emergency department with back pain. She is here with her mother. She states that her stimulator wires have moved and she is scheduled to have surgery at the end of this week. She states because of her stimulator not being in the right place it was causing her to have electric shock signals being sent into her leg. Her urologist her understanding that her off. Because of this she is having back pain and posterior lower extremity pain which she has had with her interstitial cystitis before. No numbness, tingling or focal weakness. Pain worse with movement. Is taking Vicodin at home without improvement. Denies bowel or bladder incontinence. Chronically has some urinary retention from her interstitial cystitis but is able to urinate. No dysuria hematuria. No vaginal bleeding or discharge. No fever. She is nauseous but no vomiting or diarrhea. No recent injury to her back.  ROS: See HPI Constitutional: no fever  Eyes: no drainage  ENT: no runny nose   Cardiovascular:  no chest pain  Resp: no SOB  GI: no vomiting GU: no dysuria Integumentary: no rash  Allergy: no hives  Musculoskeletal: no leg swelling  Neurological: no slurred speech ROS otherwise negative  PAST MEDICAL HISTORY/PAST SURGICAL HISTORY:  Past Medical History  Diagnosis Date  . 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 SLEEP APNEA 08/31/2007  . 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 DEPRESSION 08/31/2007  . ARTHRITIS 08/31/2007  . HYPERTENSION 08/31/2007  . Chronic interstitial cystitis 03/11/2009  . IBS 03/11/2009  . GERD 02/05/2009  . BRONCHITIS, RECURRENT 08/23/2009    Asthmatic Bronchitis-Dr. Melvyn Novas.....-HFA 75% 12/04/2008>75% 02/05/2009>75% 08/04/2009 -PFT's 01/04/2009 2.56 (86%)  ratio 75, no resp to B2 and DLC0 67% > 80 after correction   . Anal fissure 03/11/2009  . RECTAL BLEEDING 03/11/2009  . COLONIC POLYPS, HX OF 07/25/2006    ADENOMATOUS POLYP  . Migraine headache   . Chronic pain   . Chronic nausea   . COPD (chronic obstructive pulmonary disease)   . Asthma   . Anxiety and depression   . Uterine cyst   . Endometriosis   . Bowel obstruction   . Colon polyps   . Internal hemorrhoids   . Cancer     cervical cancer  . FIBROMYALGIA 08/31/2007  . Arthritis   . Anemia   . Anxiety   . Hyperlipidemia   . Seizures     been about 1 year since last seisure per pt  . Bipolar 1 disorder     MEDICATIONS:  Prior to Admission medications   Medication Sig Start Date End Date Taking? Authorizing Provider  albuterol (VENTOLIN HFA) 108 (90 BASE) MCG/ACT inhaler Inhale 2 puffs into the lungs every 4 (four) hours as needed for wheezing or shortness of breath. 11/26/13   Midge Minium, MD  benzonatate (TESSALON) 200 MG capsule Take 1 capsule (200 mg total) by mouth 3 (three) times daily as needed for cough. Patient not taking: Reported on 05/12/2014 11/27/13   Rosalita Chessman, DO  budesonide-formoterol (SYMBICORT) 160-4.5 MCG/ACT inhaler INHALE TWO PUFFS BY MOUTH TWICE DAILY 11/26/13   Midge Minium, MD  cetirizine (ZYRTEC) 10 MG tablet Take 10 mg by mouth at bedtime as needed for allergies.    Historical Provider, MD  clonazePAM [facility] [place]) 1  MG tablet TAKE ONE TABLET BY MOUTH TWICE DAILY Patient not taking: Reported on 05/12/2014 02/18/14   Rosalita Chessman, DO  doxazosin (CARDURA) 2 MG tablet TAKE ONE TABLET BY MOUTH AT BEDTIME 05/11/14   Rosalita Chessman, DO  estradiol (ESTRACE) 1 MG tablet Take 2 mg by mouth daily.    Historical Provider, MD  fluconazole (DIFLUCAN) 150 MG tablet Take 1 tablet (150 mg total) by mouth once. May repeat in 3 days if needed Patient not taking: Reported on 05/12/2014 11/26/13   Midge Minium, MD  guaiFENesin-codeine [facility]) 100-10  MG/5ML syrup TAKE ONE TEASPOONFUL BY MOUTH THREE TIMES DAILY AS NEEDED 11/26/13   Midge Minium, MD  HYDROcodone-acetaminophen (NORCO/VICODIN) 5-325 MG per tablet Take 1-2 tablets by mouth every 6 (six) hours as needed for moderate pain. Patient not taking: Reported on 05/12/2014 09/10/13   Fredia Sorrow, MD  HYDROcodone-acetaminophen (NORCO/VICODIN) 5-325 MG per tablet Take 1-2 tablets by mouth every 6 (six) hours as needed for moderate pain. 04/25/14   Fredia Sorrow, MD  hyoscyamine (LEVSIN, ANASPAZ) 0.125 MG tablet Take 0.125 mg by mouth every 4 (four) hours as needed for bladder spasms.    Historical Provider, MD  Multiple Vitamin (MULTIVITAMIN WITH MINERALS) TABS tablet Take 1 tablet by mouth daily.    Historical Provider, MD  omeprazole (PRILOSEC) 40 MG capsule TAKE ONE CAPSULE BY MOUTH ONCE DAILY 01/08/14   Rosalita Chessman, DO  promethazine (PHENERGAN) 25 MG tablet TAKE ONE TABLET BY MOUTH EVERY 8 HOURS AS NEEDED FOR NAUSEA AND VOMITING Patient not taking: Reported on 05/12/2014 04/13/14   Rosalita Chessman, DO  promethazine (PHENERGAN) 25 MG tablet Take 1 tablet (25 mg total) by mouth every 6 (six) hours as needed for nausea or vomiting. 04/25/14   Fredia Sorrow, MD  promethazine-dextromethorphan (PROMETHAZINE-DM) 6.25-15 MG/5ML syrup Take 5 mLs by mouth 4 (four) times daily as needed for cough. Patient not taking: Reported on 05/12/2014 12/03/13   Rosalita Chessman, DO  QUEtiapine (SEROQUEL) 300 MG tablet TAKE ONE TABLET BY MOUTH TWICE DAILY ,  FOLLOW  UP  NEEDED  AS  SOON  AS  POSSIBLE 06/12/14   Rosalita Chessman, DO  sertraline (ZOLOFT) 100 MG tablet Take 1 tablet (100 mg total) by mouth daily. 03/27/14   Rosalita Chessman, DO  sulfamethoxazole-trimethoprim (BACTRIM DS) 800-160 MG per tablet Take 1 tablet by mouth 2 (two) times daily. Patient not taking: Reported on 05/12/2014 11/26/13   Midge Minium, MD  temazepam (RESTORIL) 15 MG capsule TAKE ONE CAPSULE BY MOUTH AT BEDTIME AS NEEDED FOR SLEEP Patient  not taking: Reported on 05/12/2014 02/26/14   Rosalita Chessman, DO  traZODone (DESYREL) 50 MG tablet TAKE TWO TABLETS BY MOUTH AT BEDTIME Patient not taking: Reported on 05/12/2014 03/03/14   Rosalita Chessman, DO    ALLERGIES:  Allergies  Allergen Reactions  . Abilify [Aripiprazole] Swelling, Other (See Comments) and Palpitations    tremors Throat swelling, tremors  . Metoclopramide Hcl Other (See Comments)    Causes seizures  . Tramadol Swelling, Other (See Comments) and Rash    Throat swelling, tremors  . Ambien [Zolpidem Tartrate] Other (See Comments)    hallucinations  . Eszopiclone Other (See Comments)    Hallucinations, hyper, bad taste in mouth   . Amitriptyline     Other reaction(s): Angioedema (ALLERGY/intolerance)  . Metoclopramide     Other reaction(s): Other (See Comments) Seizures  . Varenicline Other (See Comments)  Suicidal thoughts  . Buprenorphine Hcl Itching and Hives  . Demerol [Meperidine] Rash  . Emetrol Itching, Rash and Hives    Other reaction(s): Rash (ALLERGY/intolerance)  . Morphine And Related Hives and Itching  . Propoxyphene N-Acetaminophen Hives, Itching and Rash    Other reaction(s): Rash (ALLERGY/intolerance)    SOCIAL HISTORY:  History  Substance Use Topics  . Smoking status: Current Every Day Smoker -- 1.00 packs/day for 14 years    Types: Cigarettes  . Smokeless tobacco: Never Used  . Alcohol Use: Yes     Comment: occasional wine     FAMILY HISTORY: Family History  Problem Relation Age of Onset  . Heart disease Father   . Asthma Maternal Grandmother   . Emphysema Maternal Grandfather   . Cancer Maternal Grandfather     Lung Cancer  . Cancer Other     Lung Cancer-Aunt  . Colon cancer Neg Hx   . Esophageal cancer Neg Hx   . Rectal cancer Neg Hx   . Stomach cancer Neg Hx     EXAM: BP 114/74 mmHg  Pulse 111  Temp(Src) 98.2 F (36.8 C) (Oral)  Resp 18  Ht 5\' 4"  (1.626 m)  Wt 114 lb (51.71 kg)  BMI 19.56 kg/m2  SpO2  100% CONSTITUTIONAL: Alert and oriented and responds appropriately to questions. Well-appearing; well-nourished, appears uncomfortable but nontoxic, tearful HEAD: Normocephalic EYES: Conjunctivae clear, PERRL ENT: normal nose; no rhinorrhea; moist mucous membranes; pharynx without lesions noted NECK: Supple, no meningismus, no LAD  CARD: Regular and tachycardic; S1 and S2 appreciated; no murmurs, no clicks, no rubs, no gallops RESP: Normal chest excursion without splinting or tachypnea; breath sounds clear and equal bilaterally; no wheezes, no rhonchi, no rales,  ABD/GI: Normal bowel sounds; non-distended; soft, non-tender, no rebound, no guarding BACK:  Tender to palpation diffusely over her lumbar spine without step-off or deformity, no lesions noted, The back appears normal; there is no CVA tenderness EXT: Normal ROM in all joints; non-tender to palpation; no edema; normal capillary refill; no cyanosis    SKIN: Normal color for age and race; warm NEURO: Moves all extremities equally, sensation to light touch intact diffusely, cranial nerves II-12 intact PSYCH: The patient's mood and manner are appropriate. Grooming and personal hygiene are appropriate.  MEDICAL DECISION MAKING: Patient here with an exacerbation of her chronic pain secondary to her interstitial cystitis. States that she has a stimulator placed for her interstitial cystitis and that the wires are no longer in place. She has surgery scheduled in 4 days with Dr. Amalia Hailey with urology per her report. She states because her skin is off it is causing her to have back pain which she has had the past. She is on Vicodin at home with no relief. Patient reports significant pain improvement with IM Dilaudid. We'll discharge with her prescription for oral Dilaudid to take at home as well as Zofran. She has a PCP, Dr. Etter Sjogren, and a urologist for follow-up. Discussed return precautions. Given she is neurologically intact with no red flag symptoms I do  not feel she needs imaging of her back today. Urine shows no sign of infection and she is not pregnant. Patient verbalizes understanding and is comfortable with plan.       [place], DO 06/23/14 1401

## 2014-06-23 NOTE — ED Notes (Signed)
Lower back pain 

## 2014-06-24 ENCOUNTER — Other Ambulatory Visit: Payer: Self-pay | Admitting: Family Medicine

## 2014-06-24 NOTE — Telephone Encounter (Signed)
Rx sent to the pharmacy by e-script.//AB/CMA 

## 2014-07-22 ENCOUNTER — Other Ambulatory Visit: Payer: Self-pay | Admitting: Family Medicine

## 2014-07-22 NOTE — Telephone Encounter (Signed)
30 day supply doxazosin sent to pharmacy.  Pt seen for acute visit 11/2013 and has not future appts scheduled with PCP.  Please advise when next follow up is due?

## 2014-07-29 ENCOUNTER — Other Ambulatory Visit: Payer: Self-pay | Admitting: Family Medicine

## 2014-07-29 NOTE — Telephone Encounter (Signed)
30 day supply Quetiapine sent to pharmacy. Pt needs f/u as soon as possible with Dr Etter Sjogren.  Please call pt to arrange appt before further refills are due.

## 2014-07-29 NOTE — Telephone Encounter (Signed)
Informed patient of med refill and appointment scheduled for 08/10/14

## 2014-08-05 ENCOUNTER — Telehealth: Payer: Self-pay

## 2014-08-05 NOTE — Telephone Encounter (Signed)
Pt is due for a colon at [facility] hospital Left message on machine to call back

## 2014-08-07 ENCOUNTER — Other Ambulatory Visit: Payer: Self-pay

## 2014-08-07 DIAGNOSIS — K589 Irritable bowel syndrome without diarrhea: Secondary | ICD-10-CM

## 2014-08-07 DIAGNOSIS — Z860100 Personal history of colon polyps, unspecified: Secondary | ICD-10-CM

## 2014-08-07 MED ORDER — MOVIPREP 100 G PO SOLR: 1.0000 | Freq: Once | ORAL | Status: DC

## 2014-08-07 NOTE — Telephone Encounter (Signed)
Colon scheduled, pt instructed and medications reviewed.  Patient instructions mailed to home.  Patient to call with any questions or concerns.  

## 2014-08-10 ENCOUNTER — Ambulatory Visit (INDEPENDENT_AMBULATORY_CARE_PROVIDER_SITE_OTHER): Payer: BLUE CROSS/BLUE SHIELD | Admitting: Family Medicine

## 2014-08-10 ENCOUNTER — Encounter: Payer: Self-pay | Admitting: Family Medicine

## 2014-08-10 VITALS — BP 100/70 | HR 80 | Temp 98.3°F | Wt 114.0 lb

## 2014-08-10 DIAGNOSIS — M609 Myositis, unspecified: Secondary | ICD-10-CM

## 2014-08-10 DIAGNOSIS — M2540 Effusion, unspecified joint: Secondary | ICD-10-CM

## 2014-08-10 DIAGNOSIS — K219 Gastro-esophageal reflux disease without esophagitis: Secondary | ICD-10-CM | POA: Diagnosis not present

## 2014-08-10 DIAGNOSIS — M791 Myalgia: Secondary | ICD-10-CM | POA: Diagnosis not present

## 2014-08-10 DIAGNOSIS — F3110 Bipolar disorder, current episode manic without psychotic features, unspecified: Secondary | ICD-10-CM | POA: Diagnosis not present

## 2014-08-10 DIAGNOSIS — IMO0001 Reserved for inherently not codable concepts without codable children: Secondary | ICD-10-CM

## 2014-08-10 DIAGNOSIS — G4700 Insomnia, unspecified: Secondary | ICD-10-CM

## 2014-08-10 MED ORDER — SERTRALINE HCL 100 MG PO TABS: 100.0000 mg | ORAL_TABLET | Freq: Every day | ORAL | Status: DC

## 2014-08-10 MED ORDER — CLONAZEPAM 1 MG PO TABS: 1.0000 mg | ORAL_TABLET | Freq: Two times a day (BID) | ORAL | Status: DC | PRN

## 2014-08-10 MED ORDER — TEMAZEPAM 15 MG PO CAPS: 15.0000 mg | ORAL_CAPSULE | Freq: Every evening | ORAL | Status: DC | PRN

## 2014-08-10 MED ORDER — OMEPRAZOLE 40 MG PO CPDR: 40.0000 mg | DELAYED_RELEASE_CAPSULE | Freq: Every day | ORAL | Status: DC

## 2014-08-10 NOTE — Patient Instructions (Signed)

## 2014-08-10 NOTE — Progress Notes (Signed)
Patient ID: [name], female    DOB: 05/14/1975  Age: 39 y.o. MRN: [id number]    Subjective:  Subjective HPI [name] presents for f/u bipolar--- inc in anxiety.  She is seeing psych but her last app was cancelled.  She is requesting a refill on klonopin until she can get back with psych.  Pt is also c/o edema in upper ext that is worsening--she was seen 2 years ago for same thing  Review of Systems  Constitutional: Negative for activity change, appetite change, fatigue and unexpected weight change.  Respiratory: Negative for cough and shortness of breath.   Cardiovascular: Negative for chest pain and palpitations.  Psychiatric/Behavioral: Negative for behavioral problems and dysphoric mood. The patient is not nervous/anxious.     History Past Medical History  Diagnosis Date  . 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 SLEEP APNEA 08/31/2007  . 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 DEPRESSION 08/31/2007  . ARTHRITIS 08/31/2007  . HYPERTENSION 08/31/2007  . Chronic interstitial cystitis 03/11/2009  . IBS 03/11/2009  . GERD 02/05/2009  . BRONCHITIS, RECURRENT 08/23/2009    Asthmatic Bronchitis-Dr. Wert.....-HFA 75% 12/04/2008>75% 02/05/2009>75% 08/04/2009 -PFT's 01/04/2009 2.56 (86%) ratio 75, no resp to B2 and DLC0 67% > 80 after correction   . Anal fissure 03/11/2009  . RECTAL BLEEDING 03/11/2009  . COLONIC POLYPS, HX OF 07/25/2006    ADENOMATOUS POLYP  . Migraine headache   . Chronic pain   . Chronic nausea   . COPD (chronic obstructive pulmonary disease)   . Asthma   . Anxiety and depression   . Uterine cyst   . Endometriosis   . Bowel obstruction   . Colon polyps   . Internal hemorrhoids   . Cancer     cervical cancer  . FIBROMYALGIA 08/31/2007  . Arthritis   . Anemia   . Anxiety   . Hyperlipidemia   . Seizures     been about 1 year since last seisure per pt  . Bipolar 1 disorder     She has past surgical history that includes  Cholecystectomy; Abdominal hysterectomy; Pacemaker insertion; Bladder surgery; interstitial cystitis; bladder stretching x6; replaced bladder pacemaker; removal of uterine cyst and scrapped uterus; and Colonoscopy.   Her family history includes Asthma in her maternal grandmother; Cancer in her maternal grandfather and other; Emphysema in her maternal grandfather; Heart disease in her father. There is no history of Colon cancer, Esophageal cancer, Rectal cancer, or Stomach cancer.She reports that she has been smoking Cigarettes.  She has a 7 pack-year smoking history. She has never used smokeless tobacco. She reports that she drinks alcohol. She reports that she uses illicit drugs (Marijuana).  Current Outpatient Prescriptions on File Prior to Visit  Medication Sig Dispense Refill  . albuterol (VENTOLIN HFA) 108 (90 BASE) MCG/ACT inhaler Inhale 2 puffs into the lungs every 4 (four) hours as needed for wheezing or shortness of breath. 1 Inhaler 1  . budesonide-formoterol (SYMBICORT) 160-4.5 MCG/ACT inhaler INHALE TWO PUFFS BY MOUTH TWICE DAILY 1 Inhaler 5  . cetirizine (ZYRTEC) 10 MG tablet Take 10 mg by mouth at bedtime as needed for allergies.    . doxazosin (CARDURA) 2 MG tablet TAKE ONE TABLET BY MOUTH AT BEDTIME 30 tablet 0  . MOVIPREP 100 G SOLR Take 1 kit (200 g total) by mouth once. 1 kit 0  . Multiple Vitamin (MULTIVITAMIN WITH MINERALS) TABS tablet Take 1 tablet by mouth daily.    . QUEtiapine (SEROQUEL) 300   MG tablet TAKE ONE TABLET BY MOUTH TWICE DAILY, FOLLOW UP NEEDED AS SOON AS POSSIBLE 60 tablet 0  . [DISCONTINUED] amitriptyline (ELAVIL) 25 MG tablet 2 tablets by mouth at bedtime     . [DISCONTINUED] clidinium-chlordiazePOXIDE (LIBRAX) 2.5-5 MG per capsule 2 capsules by mouth every morning and 1 at bedtime     . [DISCONTINUED] escitalopram (LEXAPRO) 10 MG tablet Take 10 mg by mouth daily.       No current facility-administered medications on file prior to visit.     Objective:    Objective Physical Exam  Constitutional: She is oriented to person, place, and time. She appears well-developed and well-nourished. No distress.  HENT:  Right Ear: External ear normal.  Left Ear: External ear normal.  Nose: Nose normal.  Mouth/Throat: Oropharynx is clear and moist.  Eyes: EOM are normal. Pupils are equal, round, and reactive to light.  Neck: Normal range of motion. Neck supple.  Cardiovascular: Normal rate, regular rhythm and normal heart sounds.   No murmur heard. Pulmonary/Chest: Effort normal and breath sounds normal. No respiratory distress. She has no wheezes. She has no rales. She exhibits no tenderness.  Neurological: She is alert and oriented to person, place, and time.  Psychiatric: Her behavior is normal. Judgment and thought content normal. Her mood appears anxious.   BP 100/70 mmHg  Pulse 80  Temp(Src) 98.3 F (36.8 C) (Oral)  Wt 114 lb (51.71 kg)  SpO2 98% Wt Readings from Last 3 Encounters:  08/10/14 114 lb (51.71 kg)  06/23/14 114 lb (51.71 kg)  05/12/14 120 lb (54.432 kg)     Lab Results  Component Value Date   WBC 11.4* 05/12/2014   HGB 13.8 05/12/2014   HCT 40.3 05/12/2014   PLT 369 05/12/2014   GLUCOSE 104* 05/12/2014   CHOL 127 07/19/2009   TRIG 130.0 07/19/2009   HDL 42.80 07/19/2009   LDLCALC 58 07/19/2009   ALT <5 09/10/2013   AST 12 09/10/2013   NA 137 05/12/2014   K 3.5 05/12/2014   CL 105 05/12/2014   CREATININE 0.75 05/12/2014   BUN 10 05/12/2014   CO2 24 05/12/2014   TSH 1.09 01/13/2013    No results found.   Assessment & Plan:  Plan I have discontinued [name]'s estradiol, hyoscyamine, sulfamethoxazole-trimethoprim, guaiFENesin-codeine, fluconazole, benzonatate, promethazine-dextromethorphan, clonazePAM, promethazine, promethazine, HYDROmorphone, and ondansetron. I have also changed her omeprazole and temazepam. Additionally, I am having her start on clonazePAM. Lastly, I am having her maintain her cetirizine,  albuterol, budesonide-formoterol, multivitamin with minerals, doxazosin, QUEtiapine, MOVIPREP, prochlorperazine, HYDROcodone-acetaminophen, traZODone, and sertraline.  Meds ordered this encounter  Medications  . prochlorperazine (COMPAZINE) 10 MG tablet    Sig: Take 10 mg by mouth.  . HYDROcodone-acetaminophen (NORCO) 10-325 MG per tablet    Sig: Take 1 tablet by mouth.  . traZODone (DESYREL) 100 MG tablet    Sig: Take 1 tablet by mouth at bedtime.  . omeprazole (PRILOSEC) 40 MG capsule    Sig: Take 1 capsule (40 mg total) by mouth daily.    Dispense:  90 capsule    Refill:  3  . temazepam (RESTORIL) 15 MG capsule    Sig: Take 1 capsule (15 mg total) by mouth at bedtime as needed. for sleep    Dispense:  30 capsule    Refill:  0  . sertraline (ZOLOFT) 100 MG tablet    Sig: Take 1 tablet (100 mg total) by mouth daily.    Dispense:  90 tablet      Refill:  3  . clonazePAM (KLONOPIN) 1 MG tablet    Sig: Take 1 tablet (1 mg total) by mouth 2 (two) times daily as needed for anxiety.    Dispense:  60 tablet    Refill:  1    Problem List Items Addressed This Visit    GERD - Primary   Relevant Medications   omeprazole (PRILOSEC) 40 MG capsule   Insomnia   Relevant Medications   temazepam (RESTORIL) 15 MG capsule    Other Visit Diagnoses    Bipolar disorder, most recent episode manic, remission status unspecified        Relevant Medications    sertraline (ZOLOFT) 100 MG tablet    clonazePAM (KLONOPIN) 1 MG tablet    Joint swelling        Relevant Orders    HgB A1c    BASIC METABOLIC PANEL WITH GFR    TSH    CBC with Differential    Sed Rate (ESR)    Rheumatoid factors, fluid    Antinuclear Antib (ANA)    Hepatic function panel    Myalgia and myositis        Relevant Orders    HgB A1c    BASIC METABOLIC PANEL WITH GFR    TSH    CBC with Differential    Sed Rate (ESR)    Rheumatoid factors, fluid    Antinuclear Antib (ANA)    Hepatic function panel       Follow-up:  Return in about 6 months (around 02/10/2015), or if symptoms worsen or fail to improve.  Yvonne Lowne, DO      

## 2014-08-10 NOTE — Progress Notes (Signed)
Pre visit review using our clinic review tool, if applicable. No additional management support is needed unless otherwise documented below in the visit note. 

## 2014-08-11 LAB — HEPATIC FUNCTION PANEL
ALT: 7 U/L (ref 0–35)
AST: 12 U/L (ref 0–37)
Albumin: 3.5 g/dL (ref 3.5–5.2)
Alkaline Phosphatase: 78 U/L (ref 39–117)
Bilirubin, Direct: 0.1 mg/dL (ref 0.0–0.3)
Total Bilirubin: 0.2 mg/dL (ref 0.2–1.2)
Total Protein: 6.4 g/dL (ref 6.0–8.3)

## 2014-08-11 LAB — CBC WITH DIFFERENTIAL/PLATELET
Basophils Absolute: 0.1 10*3/uL (ref 0.0–0.1)
Basophils Relative: 1.2 % (ref 0.0–3.0)
Eosinophils Absolute: 0 10*3/uL (ref 0.0–0.7)
Eosinophils Relative: 0.7 % (ref 0.0–5.0)
HCT: 34.5 % — ABNORMAL LOW (ref 36.0–46.0)
Hemoglobin: 11.5 g/dL — ABNORMAL LOW (ref 12.0–15.0)
Lymphocytes Relative: 28.3 % (ref 12.0–46.0)
Lymphs Abs: 1.9 10*3/uL (ref 0.7–4.0)
MCHC: 33.3 g/dL (ref 30.0–36.0)
MCV: 96.2 fl (ref 78.0–100.0)
Monocytes Absolute: 0.2 10*3/uL (ref 0.1–1.0)
Monocytes Relative: 2.9 % — ABNORMAL LOW (ref 3.0–12.0)
Neutro Abs: 4.5 10*3/uL (ref 1.4–7.7)
Neutrophils Relative %: 66.9 % (ref 43.0–77.0)
Platelets: 296 10*3/uL (ref 150.0–400.0)
RBC: 3.59 Mil/uL — ABNORMAL LOW (ref 3.87–5.11)
RDW: 12.8 % (ref 11.5–15.5)
WBC: 6.7 10*3/uL (ref 4.0–10.5)

## 2014-08-11 LAB — HEMOGLOBIN A1C: Hgb A1c MFr Bld: 4.9 % (ref 4.6–6.5)

## 2014-08-11 LAB — TSH: TSH: 0.45 u[IU]/mL (ref 0.35–4.50)

## 2014-08-11 LAB — SEDIMENTATION RATE: Sed Rate: 9 mm/hr (ref 0–22)

## 2014-08-11 LAB — RHEUMATOID FACTOR: Rhuematoid fact SerPl-aCnc: <10 IU/mL (ref <=14)

## 2014-08-11 LAB — BASIC METABOLIC PANEL WITH GFR
BUN: 7 mg/dL (ref 6–23)
CO2: 27 mEq/L (ref 19–32)
Calcium: 9.1 mg/dL (ref 8.4–10.5)
Chloride: 105 mEq/L (ref 96–112)
Creat: 0.81 mg/dL (ref 0.50–1.10)
GFR, Est African American: >89 mL/min
GFR, Est Non African American: >89 mL/min
Glucose, Bld: 121 mg/dL — ABNORMAL HIGH (ref 70–99)
Potassium: 3.6 mEq/L (ref 3.5–5.3)
Sodium: 141 mEq/L (ref 135–145)

## 2014-08-11 LAB — ANA: Anti Nuclear Antibody(ANA): NEGATIVE

## 2014-08-20 ENCOUNTER — Ambulatory Visit (HOSPITAL_COMMUNITY): Payer: BLUE CROSS/BLUE SHIELD | Admitting: Anesthesiology

## 2014-08-20 ENCOUNTER — Telehealth: Payer: Self-pay | Admitting: Gastroenterology

## 2014-08-20 ENCOUNTER — Encounter (HOSPITAL_COMMUNITY)
Admission: RE | Disposition: A | Discharge status: Home / Self Care | Payer: Self-pay | Source: Ambulatory Visit | Attending: Gastroenterology

## 2014-08-20 ENCOUNTER — Encounter (HOSPITAL_COMMUNITY): Payer: Self-pay | Admitting: Gastroenterology

## 2014-08-20 ENCOUNTER — Ambulatory Visit (HOSPITAL_COMMUNITY)
Admission: RE | Admit: 2014-08-20 | Discharge: 2014-08-20 | Disposition: A | Discharge status: Home / Self Care | Payer: BLUE CROSS/BLUE SHIELD | Source: Ambulatory Visit | Attending: Gastroenterology | Admitting: Gastroenterology

## 2014-08-20 SURGERY — CANCELLED PROCEDURE
Anesthesia: Monitor Anesthesia Care

## 2014-08-20 SURGERY — Surgical Case
Anesthesia: *Unknown

## 2014-08-20 SURGICAL SUPPLY — 22 items

## 2014-08-20 NOTE — Transfer of Care (Signed)
Case cancelled

## 2014-08-20 NOTE — Interval H&P Note (Signed)
History and Physical Interval Note:  08/20/2014 9:51 AM  [name]  has presented today for surgery, with the diagnosis of IBS, history of polyps  The various methods of treatment have been discussed with the patient and family. After consideration of risks, benefits and other options for treatment, the patient has consented to  Procedure(s): COLONOSCOPY WITH PROPOFOL (N/A) as a surgical intervention .  The patient's history has been reviewed, patient examined, no change in status, stable for surgery.  I have reviewed the patient's chart and labs.  Questions were answered to the patient's satisfaction.     Milus Banister

## 2014-08-20 NOTE — Telephone Encounter (Signed)
Left message on machine to call back  

## 2014-08-20 NOTE — Anesthesia Preprocedure Evaluation (Signed)
Anesthesia Evaluation  Patient identified by MRN, date of birth, ID band Patient awake    Reviewed: Allergy & Precautions, NPO status , Patient's Chart, lab work & pertinent test results  History of Anesthesia Complications Negative for: history of anesthetic complications  Airway Mallampati: II  TM Distance: >3 FB Neck ROM: Full    Dental  (+) Poor Dentition, Dental Advisory Given   Pulmonary asthma , COPDCurrent Smoker,    Pulmonary exam normal       Cardiovascular hypertension, Pt. on medications Normal cardiovascular exam    Neuro/Psych PSYCHIATRIC DISORDERS Anxiety Depression Bipolar Disorder    GI/Hepatic Neg liver ROS, GERD-  ,  Endo/Other  Hypothyroidism   Renal/GU negative Renal ROS     Musculoskeletal   Abdominal   Peds  Hematology   Anesthesia Other Findings   Reproductive/Obstetrics                             Anesthesia Physical Anesthesia Plan  ASA: III  Anesthesia Plan: MAC   Post-op Pain Management:    Induction: Intravenous  Airway Management Planned: Simple Face Mask  Additional Equipment:   Intra-op Plan:   Post-operative Plan:   Informed Consent: I have reviewed the patients History and Physical, chart, labs and discussed the procedure including the risks, benefits and alternatives for the proposed anesthesia with the patient or authorized representative who has indicated his/her understanding and acceptance.   Dental advisory given  Plan Discussed with: CRNA, Anesthesiologist and Surgeon  Anesthesia Plan Comments:         Anesthesia Quick Evaluation

## 2014-08-20 NOTE — Telephone Encounter (Signed)
She is still having solid brown stools this morning, the day of her colonoscopy. She tells me she is chronically constipated and also ate a full meal at [employer] last night at midnight.  [name], Please see if we can set up repeat colonoscopy on a [facility] EUS Thursday with double prep protocol.  Also remind her about restrictions on solid food during the prep.  Thanks

## 2014-08-20 NOTE — Anesthesia Postprocedure Evaluation (Signed)
Case Cancelled

## 2014-08-20 NOTE — H&P (View-Only) (Signed)
Patient ID: [name], female    DOB: 12/01/1975  Age: 39 y.o. MRN: [id number]    Subjective:  Subjective HPI [name] presents for f/u bipolar--- inc in anxiety.  She is seeing psych but her last app was cancelled.  She is requesting a refill on klonopin until she can get back with psych.  Pt is also c/o edema in upper ext that is worsening--she was seen 2 years ago for same thing  Review of Systems  Constitutional: Negative for activity change, appetite change, fatigue and unexpected weight change.  Respiratory: Negative for cough and shortness of breath.   Cardiovascular: Negative for chest pain and palpitations.  Psychiatric/Behavioral: Negative for behavioral problems and dysphoric mood. The patient is not nervous/anxious.     History Past Medical History  Diagnosis Date  . 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 SLEEP APNEA 08/31/2007  . 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 DEPRESSION 08/31/2007  . ARTHRITIS 08/31/2007  . HYPERTENSION 08/31/2007  . Chronic interstitial cystitis 03/11/2009  . IBS 03/11/2009  . GERD 02/05/2009  . BRONCHITIS, RECURRENT 08/23/2009    Asthmatic Bronchitis-Dr. Wert.....-HFA 75% 12/04/2008>75% 02/05/2009>75% 08/04/2009 -PFT's 01/04/2009 2.56 (86%) ratio 75, no resp to B2 and DLC0 67% > 80 after correction   . Anal fissure 03/11/2009  . RECTAL BLEEDING 03/11/2009  . COLONIC POLYPS, HX OF 07/25/2006    ADENOMATOUS POLYP  . Migraine headache   . Chronic pain   . Chronic nausea   . COPD (chronic obstructive pulmonary disease)   . Asthma   . Anxiety and depression   . Uterine cyst   . Endometriosis   . Bowel obstruction   . Colon polyps   . Internal hemorrhoids   . Cancer     cervical cancer  . FIBROMYALGIA 08/31/2007  . Arthritis   . Anemia   . Anxiety   . Hyperlipidemia   . Seizures     been about 1 year since last seisure per pt  . Bipolar 1 disorder     She has past surgical history that includes  Cholecystectomy; Abdominal hysterectomy; Pacemaker insertion; Bladder surgery; interstitial cystitis; bladder stretching x6; replaced bladder pacemaker; removal of uterine cyst and scrapped uterus; and Colonoscopy.   Her family history includes Asthma in her maternal grandmother; Cancer in her maternal grandfather and other; Emphysema in her maternal grandfather; Heart disease in her father. There is no history of Colon cancer, Esophageal cancer, Rectal cancer, or Stomach cancer.She reports that she has been smoking Cigarettes.  She has a 7 pack-year smoking history. She has never used smokeless tobacco. She reports that she drinks alcohol. She reports that she uses illicit drugs (Marijuana).  Current Outpatient Prescriptions on File Prior to Visit  Medication Sig Dispense Refill  . albuterol (VENTOLIN HFA) 108 (90 BASE) MCG/ACT inhaler Inhale 2 puffs into the lungs every 4 (four) hours as needed for wheezing or shortness of breath. 1 Inhaler 1  . budesonide-formoterol (SYMBICORT) 160-4.5 MCG/ACT inhaler INHALE TWO PUFFS BY MOUTH TWICE DAILY 1 Inhaler 5  . cetirizine (ZYRTEC) 10 MG tablet Take 10 mg by mouth at bedtime as needed for allergies.    . doxazosin (CARDURA) 2 MG tablet TAKE ONE TABLET BY MOUTH AT BEDTIME 30 tablet 0  . MOVIPREP 100 G SOLR Take 1 kit (200 g total) by mouth once. 1 kit 0  . Multiple Vitamin (MULTIVITAMIN WITH MINERALS) TABS tablet Take 1 tablet by mouth daily.    . QUEtiapine (SEROQUEL) 300   MG tablet TAKE ONE TABLET BY MOUTH TWICE DAILY, FOLLOW UP NEEDED AS SOON AS POSSIBLE 60 tablet 0  . [DISCONTINUED] amitriptyline (ELAVIL) 25 MG tablet 2 tablets by mouth at bedtime     . [DISCONTINUED] clidinium-chlordiazePOXIDE (LIBRAX) 2.5-5 MG per capsule 2 capsules by mouth every morning and 1 at bedtime     . [DISCONTINUED] escitalopram (LEXAPRO) 10 MG tablet Take 10 mg by mouth daily.       No current facility-administered medications on file prior to visit.     Objective:    Objective Physical Exam  Constitutional: She is oriented to person, place, and time. She appears well-developed and well-nourished. No distress.  HENT:  Right Ear: External ear normal.  Left Ear: External ear normal.  Nose: Nose normal.  Mouth/Throat: Oropharynx is clear and moist.  Eyes: EOM are normal. Pupils are equal, round, and reactive to light.  Neck: Normal range of motion. Neck supple.  Cardiovascular: Normal rate, regular rhythm and normal heart sounds.   No murmur heard. Pulmonary/Chest: Effort normal and breath sounds normal. No respiratory distress. She has no wheezes. She has no rales. She exhibits no tenderness.  Neurological: She is alert and oriented to person, place, and time.  Psychiatric: Her behavior is normal. Judgment and thought content normal. Her mood appears anxious.   BP 100/70 mmHg  Pulse 80  Temp(Src) 98.3 F (36.8 C) (Oral)  Wt 114 lb (51.71 kg)  SpO2 98% Wt Readings from Last 3 Encounters:  08/10/14 114 lb (51.71 kg)  06/23/14 114 lb (51.71 kg)  05/12/14 120 lb (54.432 kg)     Lab Results  Component Value Date   WBC 11.4* 05/12/2014   HGB 13.8 05/12/2014   HCT 40.3 05/12/2014   PLT 369 05/12/2014   GLUCOSE 104* 05/12/2014   CHOL 127 07/19/2009   TRIG 130.0 07/19/2009   HDL 42.80 07/19/2009   LDLCALC 58 07/19/2009   ALT <5 09/10/2013   AST 12 09/10/2013   NA 137 05/12/2014   K 3.5 05/12/2014   CL 105 05/12/2014   CREATININE 0.75 05/12/2014   BUN 10 05/12/2014   CO2 24 05/12/2014   TSH 1.09 01/13/2013    No results found.   Assessment & Plan:  Plan I have discontinued Ms. [name]'s estradiol, hyoscyamine, sulfamethoxazole-trimethoprim, guaiFENesin-codeine, fluconazole, benzonatate, promethazine-dextromethorphan, clonazePAM, promethazine, promethazine, HYDROmorphone, and ondansetron. I have also changed her omeprazole and temazepam. Additionally, I am having her start on clonazePAM. Lastly, I am having her maintain her cetirizine,  albuterol, budesonide-formoterol, multivitamin with minerals, doxazosin, QUEtiapine, MOVIPREP, prochlorperazine, HYDROcodone-acetaminophen, traZODone, and sertraline.  Meds ordered this encounter  Medications  . prochlorperazine (COMPAZINE) 10 MG tablet    Sig: Take 10 mg by mouth.  . HYDROcodone-acetaminophen (NORCO) 10-325 MG per tablet    Sig: Take 1 tablet by mouth.  . traZODone (DESYREL) 100 MG tablet    Sig: Take 1 tablet by mouth at bedtime.  . omeprazole (PRILOSEC) 40 MG capsule    Sig: Take 1 capsule (40 mg total) by mouth daily.    Dispense:  90 capsule    Refill:  3  . temazepam (RESTORIL) 15 MG capsule    Sig: Take 1 capsule (15 mg total) by mouth at bedtime as needed. for sleep    Dispense:  30 capsule    Refill:  0  . sertraline (ZOLOFT) 100 MG tablet    Sig: Take 1 tablet (100 mg total) by mouth daily.    Dispense:  90 tablet      Refill:  3  . clonazePAM (KLONOPIN) 1 MG tablet    Sig: Take 1 tablet (1 mg total) by mouth 2 (two) times daily as needed for anxiety.    Dispense:  60 tablet    Refill:  1    Problem List Items Addressed This Visit    GERD - Primary   Relevant Medications   omeprazole (PRILOSEC) 40 MG capsule   Insomnia   Relevant Medications   temazepam (RESTORIL) 15 MG capsule    Other Visit Diagnoses    Bipolar disorder, most recent episode manic, remission status unspecified        Relevant Medications    sertraline (ZOLOFT) 100 MG tablet    clonazePAM (KLONOPIN) 1 MG tablet    Joint swelling        Relevant Orders    HgB A1c    BASIC METABOLIC PANEL WITH GFR    TSH    CBC with Differential    Sed Rate (ESR)    Rheumatoid factors, fluid    Antinuclear Antib (ANA)    Hepatic function panel    Myalgia and myositis        Relevant Orders    HgB A1c    BASIC METABOLIC PANEL WITH GFR    TSH    CBC with Differential    Sed Rate (ESR)    Rheumatoid factors, fluid    Antinuclear Antib (ANA)    Hepatic function panel       Follow-up:  Return in about 6 months (around 02/10/2015), or if symptoms worsen or fail to improve.  Yvonne Lowne, DO      

## 2014-08-24 ENCOUNTER — Other Ambulatory Visit: Payer: Self-pay

## 2014-08-24 DIAGNOSIS — K589 Irritable bowel syndrome without diarrhea: Secondary | ICD-10-CM

## 2014-08-24 DIAGNOSIS — Z860100 Personal history of colon polyps, unspecified: Secondary | ICD-10-CM

## 2014-08-24 NOTE — Telephone Encounter (Signed)
Pt has been scheduled for colon and previsit.  appt note has double prep  Pt aware

## 2014-08-28 ENCOUNTER — Other Ambulatory Visit: Payer: Self-pay | Admitting: Family Medicine

## 2014-08-28 ENCOUNTER — Ambulatory Visit (AMBULATORY_SURGERY_CENTER): Payer: Self-pay

## 2014-08-28 VITALS — Ht 63.5 in | Wt 107.8 lb

## 2014-08-28 DIAGNOSIS — Z860100 Personal history of colon polyps, unspecified: Secondary | ICD-10-CM

## 2014-08-28 MED ORDER — MOVIPREP 100 G PO SOLR
1.0000 | Freq: Once | ORAL | Status: DC
Start: 2014-08-28 — End: 2015-01-28

## 2014-08-28 NOTE — Progress Notes (Signed)
No allergies to eggs or soy No diet/weight loss meds No home oxygen No past problems with anesthesia except PONV  Has email  Emmi instructions given for colonoscopy

## 2014-08-31 ENCOUNTER — Encounter (HOSPITAL_COMMUNITY): Payer: Self-pay | Admitting: *Deleted

## 2014-09-03 ENCOUNTER — Ambulatory Visit (HOSPITAL_COMMUNITY)
Admission: RE | Admit: 2014-09-03 | Discharge: 2014-09-03 | Disposition: A | Discharge status: Home / Self Care | Payer: BLUE CROSS/BLUE SHIELD | Source: Ambulatory Visit | Attending: Gastroenterology | Admitting: Gastroenterology

## 2014-09-03 ENCOUNTER — Encounter (HOSPITAL_COMMUNITY): Payer: Self-pay | Admitting: *Deleted

## 2014-09-03 ENCOUNTER — Ambulatory Visit (HOSPITAL_COMMUNITY): Payer: BLUE CROSS/BLUE SHIELD | Admitting: Anesthesiology

## 2014-09-03 ENCOUNTER — Encounter (HOSPITAL_COMMUNITY)
Admission: RE | Disposition: A | Discharge status: Home / Self Care | Payer: Self-pay | Source: Ambulatory Visit | Attending: Gastroenterology

## 2014-09-03 DIAGNOSIS — F419 Anxiety disorder, unspecified: Secondary | ICD-10-CM | POA: Insufficient documentation

## 2014-09-03 DIAGNOSIS — I10 Essential (primary) hypertension: Secondary | ICD-10-CM | POA: Insufficient documentation

## 2014-09-03 DIAGNOSIS — F319 Bipolar disorder, unspecified: Secondary | ICD-10-CM | POA: Diagnosis not present

## 2014-09-03 DIAGNOSIS — K219 Gastro-esophageal reflux disease without esophagitis: Secondary | ICD-10-CM | POA: Insufficient documentation

## 2014-09-03 DIAGNOSIS — Z9049 Acquired absence of other specified parts of digestive tract: Secondary | ICD-10-CM | POA: Insufficient documentation

## 2014-09-03 DIAGNOSIS — Z8701 Personal history of pneumonia (recurrent): Secondary | ICD-10-CM | POA: Diagnosis not present

## 2014-09-03 DIAGNOSIS — Z90710 Acquired absence of both cervix and uterus: Secondary | ICD-10-CM | POA: Insufficient documentation

## 2014-09-03 DIAGNOSIS — K589 Irritable bowel syndrome without diarrhea: Secondary | ICD-10-CM | POA: Diagnosis not present

## 2014-09-03 DIAGNOSIS — Z950 Presence of cardiac pacemaker: Secondary | ICD-10-CM | POA: Insufficient documentation

## 2014-09-03 DIAGNOSIS — E785 Hyperlipidemia, unspecified: Secondary | ICD-10-CM | POA: Diagnosis not present

## 2014-09-03 DIAGNOSIS — J449 Chronic obstructive pulmonary disease, unspecified: Secondary | ICD-10-CM | POA: Insufficient documentation

## 2014-09-03 DIAGNOSIS — E039 Hypothyroidism, unspecified: Secondary | ICD-10-CM | POA: Insufficient documentation

## 2014-09-03 DIAGNOSIS — G43909 Migraine, unspecified, not intractable, without status migrainosus: Secondary | ICD-10-CM | POA: Insufficient documentation

## 2014-09-03 DIAGNOSIS — Z8541 Personal history of malignant neoplasm of cervix uteri: Secondary | ICD-10-CM | POA: Diagnosis not present

## 2014-09-03 DIAGNOSIS — M1990 Unspecified osteoarthritis, unspecified site: Secondary | ICD-10-CM | POA: Diagnosis not present

## 2014-09-03 DIAGNOSIS — M797 Fibromyalgia: Secondary | ICD-10-CM | POA: Insufficient documentation

## 2014-09-03 DIAGNOSIS — Z86010 Personal history of colonic polyps: Secondary | ICD-10-CM | POA: Insufficient documentation

## 2014-09-03 DIAGNOSIS — J45909 Unspecified asthma, uncomplicated: Secondary | ICD-10-CM | POA: Diagnosis not present

## 2014-09-03 DIAGNOSIS — Z1211 Encounter for screening for malignant neoplasm of colon: Secondary | ICD-10-CM | POA: Insufficient documentation

## 2014-09-03 DIAGNOSIS — F17210 Nicotine dependence, cigarettes, uncomplicated: Secondary | ICD-10-CM | POA: Diagnosis not present

## 2014-09-03 HISTORY — DX: Nausea with vomiting, unspecified: R11.2

## 2014-09-03 HISTORY — DX: Other specified postprocedural states: Z98.890

## 2014-09-03 HISTORY — PX: COLONOSCOPY WITH PROPOFOL: SHX5780

## 2014-09-03 SURGERY — COLONOSCOPY WITH PROPOFOL
Anesthesia: Monitor Anesthesia Care

## 2014-09-03 MED ORDER — PROPOFOL 10 MG/ML IV BOLUS
INTRAVENOUS | Status: DC | PRN
  Administered 2014-09-03 (×2): 60 mg via INTRAVENOUS
  Administered 2014-09-03 (×2): 40 mg via INTRAVENOUS

## 2014-09-03 MED ORDER — PROPOFOL 10 MG/ML IV BOLUS
INTRAVENOUS | Status: AC
  Filled 2014-09-03: qty 20

## 2014-09-03 MED ORDER — LACTATED RINGERS IV SOLN
INTRAVENOUS | Status: DC | PRN
  Administered 2014-09-03: 11:00:00 via INTRAVENOUS

## 2014-09-03 MED ORDER — SODIUM CHLORIDE 0.9 % IV SOLN: INTRAVENOUS | Status: DC

## 2014-09-03 MED ORDER — LACTATED RINGERS IV SOLN: INTRAVENOUS | Status: DC

## 2014-09-03 SURGICAL SUPPLY — 22 items

## 2014-09-03 NOTE — Op Note (Signed)
[facility] [place], [id number]   COLONOSCOPY PROCEDURE REPORT  PATIENT: [name], [name]  MR#: [id number] BIRTHDATE: [birth year] , 39  yrs. old GENDER: female ENDOSCOPIST: Milus Banister, MD PROCEDURE DATE:  09/03/2014 PROCEDURE:   Colonoscopy, surveillance First Screening Colonoscopy - Avg.  risk and is 50 yrs.  old or older - No.  Prior Negative Screening - Now for repeat screening. N/A  History of Adenoma - Now for follow-up colonoscopy & has been > or = to 3 yrs.  Yes hx of adenoma.  Has been 3 or more years since last colonoscopy.  high risk ASA CLASS:   Class III INDICATIONS:adenomatous polyp removed 2008, Dr.  Sharlett Iles colonoscopy; at age 40. MEDICATIONS: Monitored anesthesia care  DESCRIPTION OF PROCEDURE:   After the risks benefits and alternatives of the procedure were thoroughly explained, informed consent was obtained.  The digital rectal exam revealed no abnormalities of the rectum.   The [id number] ([id number])  endoscope was introduced through the anus and advanced to the cecum, which was identified by both the appendix and ileocecal valve. No adverse events experienced.   The quality of the prep was good.  The instrument was then slowly withdrawn as the colon was fully examined. Estimated blood loss is zero unless otherwise noted in this procedure report.   COLON FINDINGS: A normal appearing cecum, ileocecal valve, and appendiceal orifice were identified.  The ascending, transverse, descending, sigmoid colon, and rectum appeared unremarkable. Retroflexion was not performed due to a narrow rectal vault. The time to cecum = 3 min    Withdrawal time = 7 min    The scope was withdrawn and the procedure completed. COMPLICATIONS: There were no immediate complications.  ENDOSCOPIC IMPRESSION: Normal colonoscopy No polyps or cancers  RECOMMENDATIONS: Given your personal history of adenomatous (pre-cancerous) polyps (at age 35), you will  need a repeat colonoscopy in 5 years.  eSigned:  Milus Banister, MD 09/03/2014 12:00 PM

## 2014-09-03 NOTE — Transfer of Care (Signed)
Immediate Anesthesia Transfer of Care Note  Patient: [name]  Procedure(s) Performed: Procedure(s): COLONOSCOPY WITH PROPOFOL (N/A)  Patient Location: PACU and Endoscopy Unit  Anesthesia Type:MAC  Level of Consciousness: awake, oriented, patient cooperative, lethargic and responds to stimulation  Airway & Oxygen Therapy: Patient Spontanous Breathing and Patient connected to face mask oxygen  Post-op Assessment: Report given to RN, Post -op Vital signs reviewed and stable and Patient moving all extremities  Post vital signs: Reviewed and stable  Last Vitals:  Filed Vitals:   09/03/14 1052  BP: 105/79  Temp: 36.6 C  Resp: 16    Complications: No apparent anesthesia complications

## 2014-09-03 NOTE — Discharge Instructions (Signed)
YOU HAD AN ENDOSCOPIC PROCEDURE TODAY: Refer to the procedure report that was given to you for any specific questions about what was found during the examination.  If the procedure report does not answer your questions, please call your gastroenterologist to clarify.  YOU SHOULD EXPECT: Some feelings of bloating in the abdomen. Passage of more gas than usual.  Walking can help get rid of the air that was put into your GI tract during the procedure and reduce the bloating. If you had a lower endoscopy (such as a colonoscopy or flexible sigmoidoscopy) you may notice spotting of blood in your stool or on the toilet paper.   DIET: Your first meal following the procedure should be a light meal and then it is ok to progress to your normal diet.  A half-sandwich or bowl of soup is an example of a good first meal.  Heavy or fried foods are harder to digest and may make you feel nasueas or bloated.  Drink plenty of fluids but you should avoid alcoholic beverages for 24 hours.  ACTIVITY: Your care partner should take you home directly after the procedure.  You should plan to take it easy, moving slowly for the rest of the day.  You can resume normal activity the day after the procedure however you should NOT DRIVE or use heavy machinery for 24 hours (because of the sedation medicines used during the test).    SYMPTOMS TO REPORT IMMEDIATELY  A gastroenterologist can be reached at any hour.  Please call your doctor's office for any of the following symptoms:   Following lower endoscopy (colonoscopy, flexible sigmoidoscopy)  Excessive amounts of blood in the stool  Significant tenderness, worsening of abdominal pains  Swelling of the abdomen that is new, acute  Fever of 100 or higher  Following upper endoscopy (EGD, EUS, ERCP)  Vomiting of blood or coffee ground material  New, significant abdominal pain  New, significant chest pain or pain under the shoulder blades  Painful or persistently difficult  swallowing  New shortness of breath  Black, tarry-looking stools  FOLLOW UP: If any biopsies were taken you will be contacted by phone or by letter within the next 1-3 weeks.  Call your gastroenterologist if you have not heard about the biopsies in 3 weeks.  Please also call your gastroenterologist's office with any specific questions about appointments or follow up tests. Colonoscopy A colonoscopy is an exam to look at the entire large intestine (colon). This exam can help find problems such as tumors, polyps, inflammation, and areas of bleeding. The exam takes about 1 hour.  LET [facility] CARE PROVIDER KNOW ABOUT:   Any allergies you have.  All medicines you are taking, including vitamins, herbs, eye drops, creams, and over-the-counter medicines.  Previous problems you or members of your family have had with the use of anesthetics.  Any blood disorders you have.  Previous surgeries you have had.  Medical conditions you have. RISKS AND COMPLICATIONS  Generally, this is a safe procedure. However, as with any procedure, complications can occur. Possible complications include:  Bleeding.  Tearing or rupture of the colon wall.  Reaction to medicines given during the exam.  Infection (rare). BEFORE THE PROCEDURE   Ask your health care provider about changing or stopping your regular medicines.  You may be prescribed an oral bowel prep. This involves drinking a large amount of medicated liquid, starting the day before your procedure. The liquid will cause you to have multiple loose stools until  your stool is almost clear or light green. This cleans out your colon in preparation for the procedure.  Do not eat or drink anything else once you have started the bowel prep, unless your health care provider tells you it is safe to do so.  Arrange for someone to drive you home after the procedure. PROCEDURE   You will be given medicine to help you relax (sedative).  You will lie on  your side with your knees bent.  A long, flexible tube with a light and camera on the end (colonoscope) will be inserted through the rectum and into the colon. The camera sends video back to a computer screen as it moves through the colon. The colonoscope also releases carbon dioxide gas to inflate the colon. This helps your health care provider see the area better.  During the exam, your health care provider may take a small tissue sample (biopsy) to be examined under a microscope if any abnormalities are found.  The exam is finished when the entire colon has been viewed. AFTER THE PROCEDURE   Do not drive for 24 hours after the exam.  You may have a small amount of blood in your stool.  You may pass moderate amounts of gas and have mild abdominal cramping or bloating. This is caused by the gas used to inflate your colon during the exam.  Ask when your test results will be ready and how you will get your results. Make sure you get your test results. Document Released: 03/24/2000 Document Revised: 01/15/2013 Document Reviewed: 12/02/2012 [facility] Patient Information 2015 [place], [place]. This information is not intended to replace advice given to you by your health care provider. Make sure you discuss any questions you have with your health care provider. Colonoscopy, Care After Refer to this sheet in the next few weeks. These instructions provide you with information on caring for yourself after your procedure. Your health care provider may also give you more specific instructions. Your treatment has been planned according to current medical practices, but problems sometimes occur. Call your health care provider if you have any problems or questions after your procedure. WHAT TO EXPECT AFTER THE PROCEDURE  After your procedure, it is typical to have the following:  A small amount of blood in your stool.  Moderate amounts of gas and mild abdominal cramping or bloating. HOME CARE  INSTRUCTIONS  Do not drive, operate machinery, or sign important documents for 24 hours.  You may shower and resume your regular physical activities, but move at a slower pace for the first 24 hours.  Take frequent rest periods for the first 24 hours.  Walk around or put a warm pack on your abdomen to help reduce abdominal cramping and bloating.  Drink enough fluids to keep your urine clear or pale yellow.  You may resume your normal diet as instructed by your health care provider. Avoid heavy or fried foods that are hard to digest.  Avoid drinking alcohol for 24 hours or as instructed by your health care provider.  Only take over-the-counter or prescription medicines as directed by your health care provider.  If a tissue sample (biopsy) was taken during your procedure:  Do not take aspirin or blood thinners for 7 days, or as instructed by your health care provider.  Do not drink alcohol for 7 days, or as instructed by your health care provider.  Eat soft foods for the first 24 hours. SEEK MEDICAL CARE IF: You have persistent spotting of blood  in your stool 2-3 days after the procedure. SEEK IMMEDIATE MEDICAL CARE IF:  You have more than a small spotting of blood in your stool.  You pass large blood clots in your stool.  Your abdomen is swollen (distended).  You have nausea or vomiting.  You have a fever.  You have increasing abdominal pain that is not relieved with medicine. Document Released: 11/09/2003 Document Revised: 01/15/2013 Document Reviewed: 12/02/2012 [facility] Patient Information 2015 [place], [place]. This information is not intended to replace advice given to you by your health care provider. Make sure you discuss any questions you have with your health care provider.

## 2014-09-03 NOTE — Interval H&P Note (Signed)
History and Physical Interval Note:  09/03/2014 11:03 AM  [name]  has presented today for surgery, with the diagnosis of IBS history of polyps  The various methods of treatment have been discussed with the patient and family. After consideration of risks, benefits and other options for treatment, the patient has consented to  Procedure(s): COLONOSCOPY WITH PROPOFOL (N/A) as a surgical intervention .  The patient's history has been reviewed, patient examined, no change in status, stable for surgery.  I have reviewed the patient's chart and labs.  Questions were answered to the patient's satisfaction.     Milus Banister

## 2014-09-03 NOTE — Anesthesia Preprocedure Evaluation (Signed)
Anesthesia Evaluation  Patient identified by MRN, date of birth, ID band Patient awake    Reviewed: Allergy & Precautions, Patient's Chart, lab work & pertinent test results  Airway Mallampati: II  TM Distance: >3 FB Neck ROM: Full    Dental   Pulmonary asthma , sleep apnea , pneumonia -, Current Smoker,  breath sounds clear to auscultation        Cardiovascular hypertension, Rhythm:Regular Rate:Normal     Neuro/Psych    GI/Hepatic Neg liver ROS, GERD-  ,  Endo/Other  Hypothyroidism   Renal/GU Renal disease     Musculoskeletal   Abdominal   Peds  Hematology  (+) anemia ,   Anesthesia Other Findings   Reproductive/Obstetrics                             Anesthesia Physical Anesthesia Plan  ASA: III  Anesthesia Plan: MAC   Post-op Pain Management:    Induction: Intravenous  Airway Management Planned: Nasal Cannula  Additional Equipment:   Intra-op Plan:   Post-operative Plan:   Informed Consent: I have reviewed the patients History and Physical, chart, labs and discussed the procedure including the risks, benefits and alternatives for the proposed anesthesia with the patient or authorized representative who has indicated his/her understanding and acceptance.   Dental advisory given  Plan Discussed with: CRNA and Anesthesiologist  Anesthesia Plan Comments:         Anesthesia Quick Evaluation

## 2014-09-03 NOTE — H&P (View-Only) (Signed)
Patient ID: [name], female    DOB: Aug 17, 1975  Age: 39 y.o. MRN: [id number]    Subjective:  Subjective HPI [name] presents for f/u bipolar--- inc in anxiety.  She is seeing psych but her last app was cancelled.  She is requesting a refill on klonopin until she can get back with psych.  Pt is also c/o edema in upper ext that is worsening--she was seen 2 years ago for same thing  Review of Systems  Constitutional: Negative for activity change, appetite change, fatigue and unexpected weight change.  Respiratory: Negative for cough and shortness of breath.   Cardiovascular: Negative for chest pain and palpitations.  Psychiatric/Behavioral: Negative for behavioral problems and dysphoric mood. The patient is not nervous/anxious.     History Past Medical History  Diagnosis Date  . 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 SLEEP APNEA 08/31/2007  . 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 DEPRESSION 08/31/2007  . ARTHRITIS 08/31/2007  . HYPERTENSION 08/31/2007  . Chronic interstitial cystitis 03/11/2009  . IBS 03/11/2009  . GERD 02/05/2009  . BRONCHITIS, RECURRENT 08/23/2009    Asthmatic Bronchitis-Dr. Melvyn Novas.....-HFA 75% 12/04/2008>75% 02/05/2009>75% 08/04/2009 -PFT's 01/04/2009 2.56 (86%) ratio 75, no resp to B2 and DLC0 67% > 80 after correction   . Anal fissure 03/11/2009  . RECTAL BLEEDING 03/11/2009  . COLONIC POLYPS, HX OF 07/25/2006    ADENOMATOUS POLYP  . Migraine headache   . Chronic pain   . Chronic nausea   . COPD (chronic obstructive pulmonary disease)   . Asthma   . Anxiety and depression   . Uterine cyst   . Endometriosis   . Bowel obstruction   . Colon polyps   . Internal hemorrhoids   . Cancer     cervical cancer  . FIBROMYALGIA 08/31/2007  . Arthritis   . Anemia   . Anxiety   . Hyperlipidemia   . Seizures     been about 1 year since last seisure per pt  . Bipolar 1 disorder     She has past surgical history that includes  Cholecystectomy; Abdominal hysterectomy; Pacemaker insertion; Bladder surgery; interstitial cystitis; bladder stretching x6; replaced bladder pacemaker; removal of uterine cyst and scrapped uterus; and Colonoscopy.   Her family history includes Asthma in her maternal grandmother; Cancer in her maternal grandfather and other; Emphysema in her maternal grandfather; Heart disease in her father. There is no history of Colon cancer, Esophageal cancer, Rectal cancer, or Stomach cancer.She reports that she has been smoking Cigarettes.  She has a 7 pack-year smoking history. She has never used smokeless tobacco. She reports that she drinks alcohol. She reports that she uses illicit drugs (Marijuana).  Current Outpatient Prescriptions on File Prior to Visit  Medication Sig Dispense Refill  . albuterol (VENTOLIN HFA) 108 (90 BASE) MCG/ACT inhaler Inhale 2 puffs into the lungs every 4 (four) hours as needed for wheezing or shortness of breath. 1 Inhaler 1  . budesonide-formoterol (SYMBICORT) 160-4.5 MCG/ACT inhaler INHALE TWO PUFFS BY MOUTH TWICE DAILY 1 Inhaler 5  . cetirizine (ZYRTEC) 10 MG tablet Take 10 mg by mouth at bedtime as needed for allergies.    Marland Kitchen doxazosin (CARDURA) 2 MG tablet TAKE ONE TABLET BY MOUTH AT BEDTIME 30 tablet 0  . MOVIPREP 100 G SOLR Take 1 kit (200 g total) by mouth once. 1 kit 0  . Multiple Vitamin (MULTIVITAMIN WITH MINERALS) TABS tablet Take 1 tablet by mouth daily.    . QUEtiapine (SEROQUEL) 300  MG tablet TAKE ONE TABLET BY MOUTH TWICE DAILY, FOLLOW UP NEEDED AS SOON AS POSSIBLE 60 tablet 0  . [DISCONTINUED] amitriptyline (ELAVIL) 25 MG tablet 2 tablets by mouth at bedtime     . [DISCONTINUED] clidinium-chlordiazePOXIDE (LIBRAX) 2.5-5 MG per capsule 2 capsules by mouth every morning and 1 at bedtime     . [DISCONTINUED] escitalopram (LEXAPRO) 10 MG tablet Take 10 mg by mouth daily.       No current facility-administered medications on file prior to visit.     Objective:    Objective Physical Exam  Constitutional: She is oriented to person, place, and time. She appears well-developed and well-nourished. No distress.  HENT:  Right Ear: External ear normal.  Left Ear: External ear normal.  Nose: Nose normal.  Mouth/Throat: Oropharynx is clear and moist.  Eyes: EOM are normal. Pupils are equal, round, and reactive to light.  Neck: Normal range of motion. Neck supple.  Cardiovascular: Normal rate, regular rhythm and normal heart sounds.   No murmur heard. Pulmonary/Chest: Effort normal and breath sounds normal. No respiratory distress. She has no wheezes. She has no rales. She exhibits no tenderness.  Neurological: She is alert and oriented to person, place, and time.  Psychiatric: Her behavior is normal. Judgment and thought content normal. Her mood appears anxious.   BP 100/70 mmHg  Pulse 80  Temp(Src) 98.3 F (36.8 C) (Oral)  Wt 114 lb (51.71 kg)  SpO2 98% Wt Readings from Last 3 Encounters:  08/10/14 114 lb (51.71 kg)  06/23/14 114 lb (51.71 kg)  05/12/14 120 lb (54.432 kg)     Lab Results  Component Value Date   WBC 11.4* 05/12/2014   HGB 13.8 05/12/2014   HCT 40.3 05/12/2014   PLT 369 05/12/2014   GLUCOSE 104* 05/12/2014   CHOL 127 07/19/2009   TRIG 130.0 07/19/2009   HDL 42.80 07/19/2009   LDLCALC 58 07/19/2009   ALT <5 09/10/2013   AST 12 09/10/2013   NA 137 05/12/2014   K 3.5 05/12/2014   CL 105 05/12/2014   CREATININE 0.75 05/12/2014   BUN 10 05/12/2014   CO2 24 05/12/2014   TSH 1.09 01/13/2013    No results found.   Assessment & Plan:  Plan I have discontinued Ms. Couvillon's estradiol, hyoscyamine, sulfamethoxazole-trimethoprim, guaiFENesin-codeine, fluconazole, benzonatate, promethazine-dextromethorphan, clonazePAM, promethazine, promethazine, HYDROmorphone, and ondansetron. I have also changed her omeprazole and temazepam. Additionally, I am having her start on clonazePAM. Lastly, I am having her maintain her cetirizine,  albuterol, budesonide-formoterol, multivitamin with minerals, doxazosin, QUEtiapine, MOVIPREP, prochlorperazine, HYDROcodone-acetaminophen, traZODone, and sertraline.  Meds ordered this encounter  Medications  . prochlorperazine (COMPAZINE) 10 MG tablet    Sig: Take 10 mg by mouth.  Marland Kitchen HYDROcodone-acetaminophen (NORCO) 10-325 MG per tablet    Sig: Take 1 tablet by mouth.  . traZODone (DESYREL) 100 MG tablet    Sig: Take 1 tablet by mouth at bedtime.  Marland Kitchen omeprazole (PRILOSEC) 40 MG capsule    Sig: Take 1 capsule (40 mg total) by mouth daily.    Dispense:  90 capsule    Refill:  3  . temazepam (RESTORIL) 15 MG capsule    Sig: Take 1 capsule (15 mg total) by mouth at bedtime as needed. for sleep    Dispense:  30 capsule    Refill:  0  . sertraline (ZOLOFT) 100 MG tablet    Sig: Take 1 tablet (100 mg total) by mouth daily.    Dispense:  90 tablet  Refill:  3  . clonazePAM (KLONOPIN) 1 MG tablet    Sig: Take 1 tablet (1 mg total) by mouth 2 (two) times daily as needed for anxiety.    Dispense:  60 tablet    Refill:  1    Problem List Items Addressed This Visit    GERD - Primary   Relevant Medications   omeprazole (PRILOSEC) 40 MG capsule   Insomnia   Relevant Medications   temazepam (RESTORIL) 15 MG capsule    Other Visit Diagnoses    Bipolar disorder, most recent episode manic, remission status unspecified        Relevant Medications    sertraline (ZOLOFT) 100 MG tablet    clonazePAM (KLONOPIN) 1 MG tablet    Joint swelling        Relevant Orders    HgB T7G    BASIC METABOLIC PANEL WITH GFR    TSH    CBC with Differential    Sed Rate (ESR)    Rheumatoid factors, fluid    Antinuclear Antib (ANA)    Hepatic function panel    Myalgia and myositis        Relevant Orders    HgB Y1V    BASIC METABOLIC PANEL WITH GFR    TSH    CBC with Differential    Sed Rate (ESR)    Rheumatoid factors, fluid    Antinuclear Antib (ANA)    Hepatic function panel       Follow-up:  Return in about 6 months (around 02/10/2015), or if symptoms worsen or fail to improve.  Garnet Koyanagi, DO

## 2014-09-03 NOTE — Anesthesia Postprocedure Evaluation (Signed)
  Anesthesia Post-op Note  Patient: [name]  Procedure(s) Performed: Procedure(s): COLONOSCOPY WITH PROPOFOL (N/A)  Patient Location: PACU  Anesthesia Type:General  Level of Consciousness: awake  Airway and Oxygen Therapy: Patient Spontanous Breathing  Post-op Pain: mild  Post-op Assessment: Post-op Vital signs reviewed  Post-op Vital Signs: Reviewed  Last Vitals:  Filed Vitals:   09/03/14 1228  BP: 111/82  Pulse: 81  Temp:   Resp: 13    Complications: No apparent anesthesia complications

## 2014-09-04 ENCOUNTER — Encounter (HOSPITAL_COMMUNITY): Payer: Self-pay | Admitting: Gastroenterology

## 2014-09-04 ENCOUNTER — Other Ambulatory Visit: Payer: Self-pay | Admitting: Family Medicine

## 2014-09-04 NOTE — Telephone Encounter (Signed)
Last seen 08/10/14 and filled 03/03/14 #60 UDS 03/19/13 high risk    Please advise     KP

## 2014-09-10 ENCOUNTER — Other Ambulatory Visit: Payer: Self-pay

## 2014-09-10 DIAGNOSIS — G4700 Insomnia, unspecified: Secondary | ICD-10-CM

## 2014-09-10 MED ORDER — TEMAZEPAM 15 MG PO CAPS: 15.0000 mg | ORAL_CAPSULE | Freq: Every evening | ORAL | Status: DC | PRN

## 2014-09-10 NOTE — Telephone Encounter (Signed)
Last seen and filled 08/10/14 #30   Please advise     KP

## 2014-09-25 ENCOUNTER — Other Ambulatory Visit: Payer: Self-pay | Admitting: Obstetrics and Gynecology

## 2014-09-28 LAB — CYTOLOGY - PAP

## 2014-10-09 ENCOUNTER — Telehealth: Payer: Self-pay | Admitting: Family Medicine

## 2014-10-12 ENCOUNTER — Other Ambulatory Visit: Payer: Self-pay | Admitting: Family Medicine

## 2014-10-13 NOTE — Telephone Encounter (Signed)
Requesting: Clonazepam 1mg  Contract not on file UDS: 03/2013 High Risk per Dr Etter Sjogren Last OV: 08/10/2014 Last Refill  08/10/2014--#60 with 1 refill  Please Advise

## 2014-10-13 NOTE — Telephone Encounter (Signed)
Rx printed, awaiting MD signature.  

## 2014-10-13 NOTE — Telephone Encounter (Signed)
Rx faxed to [place] in [facility].

## 2014-10-13 NOTE — Telephone Encounter (Signed)
Last UDS high risk, okay #30, no refills

## 2014-10-21 ENCOUNTER — Encounter: Payer: Self-pay | Admitting: Gastroenterology

## 2014-11-18 ENCOUNTER — Other Ambulatory Visit: Payer: Self-pay

## 2014-11-18 MED ORDER — CLONAZEPAM 1 MG PO TABS: 1.0000 mg | ORAL_TABLET | Freq: Two times a day (BID) | ORAL | Status: DC | PRN

## 2014-11-18 NOTE — Telephone Encounter (Signed)
Last seen 08/10/14 and filled 10/13/14 #30 UDS 03/19/13 high risk. Per note no more klonopin refills   Please advise     KP

## 2014-11-18 NOTE — Telephone Encounter (Signed)
Refill x1   1 refill 

## 2014-11-18 NOTE — Telephone Encounter (Signed)
Rx called to [facility] at [place], #30 x no refills.

## 2014-12-22 ENCOUNTER — Other Ambulatory Visit: Payer: Self-pay | Admitting: Family Medicine

## 2015-01-09 ENCOUNTER — Other Ambulatory Visit: Payer: Self-pay | Admitting: Family Medicine

## 2015-01-11 NOTE — Telephone Encounter (Signed)
Last seen 08/10/14 and filled 10/13/14 #60 with 2 refills.   Please advise     KP

## 2015-01-25 ENCOUNTER — Other Ambulatory Visit: Payer: Self-pay | Admitting: Family Medicine

## 2015-01-25 NOTE — Telephone Encounter (Signed)
Last seen 08/10/14 and filled 12/24/14 #30   Please advise     KP

## 2015-01-27 ENCOUNTER — Ambulatory Visit (INDEPENDENT_AMBULATORY_CARE_PROVIDER_SITE_OTHER): Payer: BLUE CROSS/BLUE SHIELD | Admitting: Medical

## 2015-01-27 ENCOUNTER — Encounter: Payer: Self-pay | Admitting: Medical

## 2015-01-27 VITALS — BP 98/70 | HR 108 | Temp 98.0°F | Resp 16 | Wt 105.8 lb

## 2015-01-27 DIAGNOSIS — M5441 Lumbago with sciatica, right side: Secondary | ICD-10-CM | POA: Diagnosis not present

## 2015-01-27 DIAGNOSIS — M5442 Lumbago with sciatica, left side: Secondary | ICD-10-CM

## 2015-01-27 DIAGNOSIS — R1030 Lower abdominal pain, unspecified: Secondary | ICD-10-CM | POA: Diagnosis not present

## 2015-01-27 DIAGNOSIS — R5383 Other fatigue: Secondary | ICD-10-CM | POA: Diagnosis not present

## 2015-01-27 DIAGNOSIS — R0989 Other specified symptoms and signs involving the circulatory and respiratory systems: Secondary | ICD-10-CM

## 2015-01-27 DIAGNOSIS — N3010 Interstitial cystitis (chronic) without hematuria: Secondary | ICD-10-CM

## 2015-01-27 LAB — TSH: TSH: 0.33 u[IU]/mL — ABNORMAL LOW (ref 0.350–4.500)

## 2015-01-27 LAB — CBC WITH DIFFERENTIAL/PLATELET
Basophils Absolute: 0 10*3/uL (ref 0.0–0.1)
Basophils Relative: 0 % (ref 0–1)
Eosinophils Absolute: 0 10*3/uL (ref 0.0–0.7)
Eosinophils Relative: 0 % (ref 0–5)
HCT: 38.4 % (ref 36.0–46.0)
Hemoglobin: 12.8 g/dL (ref 12.0–15.0)
Lymphocytes Relative: 36 % (ref 12–46)
Lymphs Abs: 3.3 10*3/uL (ref 0.7–4.0)
MCH: 30.3 pg (ref 26.0–34.0)
MCHC: 33.3 g/dL (ref 30.0–36.0)
MCV: 91 fL (ref 78.0–100.0)
MPV: 9.5 fL (ref 8.6–12.4)
Monocytes Absolute: 0.6 10*3/uL (ref 0.1–1.0)
Monocytes Relative: 6 % (ref 3–12)
Neutro Abs: 5.3 10*3/uL (ref 1.7–7.7)
Neutrophils Relative %: 58 % (ref 43–77)
Platelets: 384 10*3/uL (ref 150–400)
RBC: 4.22 MIL/uL (ref 3.87–5.11)
RDW: 12.9 % (ref 11.5–15.5)
WBC: 9.2 10*3/uL (ref 4.0–10.5)

## 2015-01-27 LAB — COMPREHENSIVE METABOLIC PANEL
ALT: 4 U/L — ABNORMAL LOW (ref 6–29)
AST: 10 U/L (ref 10–30)
Albumin: 3.9 g/dL (ref 3.6–5.1)
Alkaline Phosphatase: 77 U/L (ref 33–115)
BUN: 11 mg/dL (ref 7–25)
CO2: 24 mmol/L (ref 20–31)
Calcium: 9.5 mg/dL (ref 8.6–10.2)
Chloride: 109 mmol/L (ref 98–110)
Creat: 1.13 mg/dL — ABNORMAL HIGH (ref 0.50–1.10)
Glucose, Bld: 94 mg/dL (ref 65–99)
Potassium: 4 mmol/L (ref 3.5–5.3)
Sodium: 139 mmol/L (ref 135–146)
Total Bilirubin: 0.3 mg/dL (ref 0.2–1.2)
Total Protein: 6.9 g/dL (ref 6.1–8.1)

## 2015-01-27 LAB — POCT URINALYSIS DIPSTICK
Bilirubin, UA: NEGATIVE
Blood, UA: NEGATIVE
Glucose, UA: NEGATIVE
Ketones, UA: NEGATIVE
Leukocytes, UA: NEGATIVE
Nitrite, UA: NEGATIVE
Protein, UA: NEGATIVE
Spec Grav, UA: 1.02
Urobilinogen, UA: 0.2
pH, UA: 6

## 2015-01-27 LAB — LIPASE: Lipase: 56 U/L (ref 7–60)

## 2015-01-27 LAB — AMYLASE: Amylase: 49 U/L (ref 0–105)

## 2015-01-27 MED ORDER — HYDROCODONE-ACETAMINOPHEN 5-325 MG PO TABS: 1.0000 | ORAL_TABLET | Freq: Four times a day (QID) | ORAL | Status: DC | PRN

## 2015-01-27 NOTE — Progress Notes (Signed)
Pre visit review using our clinic review tool, if applicable. No additional management support is needed unless otherwise documented below in the visit note. 

## 2015-01-27 NOTE — Patient Instructions (Addendum)
Will give toradol 60 mg im today for your back pain.  Will get ua dip today and send out urine culture. See if possible infection.  For back pain and can't get MRI due to neurostimulator consider ct of spine if pain persists.  For weakness/fatigue and abdominal pain will get cbc, cmp, tsh, amylase and lipase. Might consider ct of abdomen as well for chronic abd/pelvis pain. Although negative in summer.  For your chest congestion for 3 months get cxr today. May give antibiotic after results back.  Follow up in 2-5 days with pcp. Would recommend actually seeing pcp in 2 days(Dr. Etter Sjogren) going ahead and making appointment.  Pt specifically tell me only thing she can take is norco and hydrocodone. limtied supply norco  If pain worsens or changes abdomen or back pain then ED evaluation.  Some nausea.  Continue phenergan.

## 2015-01-27 NOTE — Progress Notes (Signed)
Subjective:    Patient ID: [name], female    DOB: 1976-03-30, 39 y.o.   MRN: [id number]  HPI  Pt in with some back pain. Pt has fibromyalgia and Interstitial cystitis. Also history of bipolar and hx fo myalgia on review of last note in may 2016.  Pt states back pain for 10 days. Progressivley gettting worse. Pt had lumbar xray in march 2015. Pt can't have mri since she has sacral stimulator. But also states hx of chronic lower back pain. Just worst last 10 days.  Pt pain location is mid lumbar. Patient states pain radiating down both legs. Pt states some chronic issues with having to catheterize herself.(This is not new) No saddle anesthesia.  Pt stimulator is for IC. Pt not having dysuria presently.    She states feeling weak/fatigued. States diffuse weakness. Slight anemia 5 month ago. tsh last normal. cmp ok. Pt had ct abd/pelvis. Study was stable. Pt states will see [facility] in November 4th.(She has weakness, nausea since the summer). Pt had 3 month of moderate to severe constant pain. Pain level today is not worse than in summer when had  Prior CT.   Also endometriosis. She had hystertcomy in 2006.  Prior use of toradol. No allergic reaction on review.  Also 3 month of mid chest congestion. Chronic cough. Stopped smoking 3 wks ago.      Review of Systems  Constitutional: Negative for fever, chills, diaphoresis, activity change and fatigue.  Respiratory: Negative for cough, chest tightness and shortness of breath.   Cardiovascular: Negative for chest pain, palpitations and leg swelling.  Gastrointestinal: Negative for nausea, vomiting and abdominal pain.  Genitourinary: Negative for frequency, flank pain, difficulty urinating and dyspareunia.       Hx of having to catheterize herself since can't empty.   Musculoskeletal: Positive for back pain. Negative for neck pain and neck stiffness.  Skin: Negative for rash.  Neurological: Negative for dizziness, seizures and  weakness.       Radicular pain to legs.  Psychiatric/Behavioral: Negative for suicidal ideas, behavioral problems, confusion, self-injury and agitation. The patient is not nervous/anxious.     Past Medical History  Diagnosis Date  . 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 SLEEP APNEA 08/31/2007  . 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 DEPRESSION 08/31/2007  . ARTHRITIS 08/31/2007  . HYPERTENSION 08/31/2007  . Chronic interstitial cystitis 03/11/2009  . IBS 03/11/2009  . GERD 02/05/2009  . BRONCHITIS, RECURRENT 08/23/2009    Asthmatic Bronchitis-Dr. Melvyn Novas.....-HFA 75% 12/04/2008>75% 02/05/2009>75% 08/04/2009 -PFT's 01/04/2009 2.56 (86%) ratio 75, no resp to B2 and DLC0 67% > 80 after correction   . Anal fissure 03/11/2009  . RECTAL BLEEDING 03/11/2009  . COLONIC POLYPS, HX OF 07/25/2006    ADENOMATOUS POLYP  . Migraine headache   . Chronic pain   . Chronic nausea   . COPD (chronic obstructive pulmonary disease) ([place])   . Asthma   . Anxiety and depression   . Uterine cyst   . Endometriosis   . Bowel obstruction ([place])   . Colon polyps   . Internal hemorrhoids   . Cancer (HCC)     cervical cancer  . FIBROMYALGIA 08/31/2007  . Arthritis   . Anemia   . Anxiety   . Hyperlipidemia   . Seizures ([place])     been about 1 year since last seisure per pt  . Bipolar 1 disorder ([place])   . PONV (postoperative nausea and vomiting)  Social History   Social History  . Marital Status: Married    Spouse Name: N/A  . Number of Children: 2  . Years of Education: N/A   Occupational History  .     Social History Main Topics  . Smoking status: Current Every Day Smoker -- 0.50 packs/day for 14 years    Types: Cigarettes  . Smokeless tobacco: Never Used     Comment: trying to quit  . Alcohol Use: 0.0 oz/week    0 Standard drinks or equivalent per week     Comment: rare wine   . Drug Use: Yes    Special: Marijuana     Comment: occasional, pt states today 06/19/12  no drug use  . Sexual Activity: Not on file   Other Topics Concern  . Not on file   Social History Narrative   Homemaker   Daily Caffeine Use-Mtn. Dew          Past Surgical History  Procedure Laterality Date  . Cholecystectomy    . Abdominal hysterectomy    . Pacemaker insertion      in hip for interstitial cystitis  . Bladder surgery      stimulator placed and stretching   . Interstitial cystitis    . Bladder stretching x6    . Replaced bladder pacemaker    . Removal of uterine cyst and scrapped uterus    . Colonoscopy    . Colonoscopy with propofol N/A 09/03/2014    Procedure: COLONOSCOPY WITH PROPOFOL;  Surgeon: Milus Banister, MD;  Location: [facility];  Service: Endoscopy;  Laterality: N/A;    Family History  Problem Relation Age of Onset  . Heart disease Father   . Asthma Maternal Grandmother   . Emphysema Maternal Grandfather   . Cancer Maternal Grandfather     Lung Cancer  . Cancer Other     Lung Cancer-Aunt  . Colon cancer Neg Hx   . Esophageal cancer Neg Hx   . Rectal cancer Neg Hx   . Stomach cancer Neg Hx     Allergies  Allergen Reactions  . Abilify [Aripiprazole] Swelling, Other (See Comments) and Palpitations    tremors Throat swelling, tremors  . Metoclopramide Hcl Other (See Comments)    Causes seizures  . Tramadol Swelling, Other (See Comments) and Rash    Throat swelling, tremors  . Ambien [Zolpidem Tartrate] Other (See Comments)    hallucinations  . Eszopiclone Other (See Comments)    Hallucinations, hyper, bad taste in mouth   . Amitriptyline     Other reaction(s): Angioedema (ALLERGY/intolerance)  . Metoclopramide     Other reaction(s): Other (See Comments) Seizures  . Other     Acetaminophen #3  . Varenicline Other (See Comments)    Suicidal thoughts  . Buprenorphine Hcl Itching and Hives  . Demerol [Meperidine] Rash  . Emetrol Itching, Rash and Hives    Other reaction(s): Rash (ALLERGY/intolerance)  . Morphine And  Related Hives and Itching  . Propoxyphene N-Acetaminophen Hives, Itching and Rash    Other reaction(s): Rash (ALLERGY/intolerance)    Current Outpatient Prescriptions on File Prior to Visit  Medication Sig Dispense Refill  . albuterol (VENTOLIN HFA) 108 (90 BASE) MCG/ACT inhaler Inhale 2 puffs into the lungs every 4 (four) hours as needed for wheezing or shortness of breath. 1 Inhaler 1  . BLACK COHOSH PO Take 1 tablet by mouth every evening.    . budesonide-formoterol (SYMBICORT) 160-4.5 MCG/ACT inhaler INHALE TWO PUFFS BY MOUTH  TWICE DAILY 1 Inhaler 5  . cetirizine (ZYRTEC) 10 MG tablet Take 10 mg by mouth at bedtime as needed for allergies.    . cholecalciferol (VITAMIN D) 1000 UNITS tablet Take 1,000 Units by mouth every evening.    . clonazePAM (KLONOPIN) 1 MG tablet TAKE 1 TABLET BY MOUTH TWICE DAILY AS NEEDED FOR ANXIETY 30 tablet 0  . Cyanocobalamin (VITAMIN B 12 PO) Take 1 tablet by mouth every evening.    Marland Kitchen doxazosin (CARDURA) 2 MG tablet TAKE ONE TABLET BY MOUTH AT BEDTIME 30 tablet 5  . Estradiol (ESTRACE PO) Take 1 tablet by mouth every morning.    . folic acid (FOLVITE) 833 MCG tablet Take 400 mcg by mouth every evening.    . hyoscyamine (LEVSIN, ANASPAZ) 0.125 MG tablet Take 0.125 mg by mouth every 4 (four) hours as needed for cramping.    Marland Kitchen MOVIPREP 100 G SOLR Take 1 kit (200 g total) by mouth once. 1 kit 0  . MOVIPREP 100 G SOLR Take 1 kit (200 g total) by mouth once. 1 kit 0  . Multiple Vitamin (MULTIVITAMIN WITH MINERALS) TABS tablet Take 1 tablet by mouth every evening.     . Omega-3 Fatty Acids (FISH OIL) 1200 MG CAPS Take 2 capsules by mouth every evening.    Marland Kitchen omeprazole (PRILOSEC) 40 MG capsule Take 1 capsule (40 mg total) by mouth daily. 90 capsule 3  . prochlorperazine (COMPAZINE) 10 MG tablet Take 10 mg by mouth every 6 (six) hours as needed for nausea or vomiting.     Marland Kitchen QUEtiapine (SEROQUEL) 300 MG tablet Take 1 tablet (300 mg total) by mouth 2 (two) times daily.  60 tablet 5  . sertraline (ZOLOFT) 100 MG tablet Take 1 tablet (100 mg total) by mouth daily. 90 tablet 3  . temazepam (RESTORIL) 15 MG capsule Take 1 capsule (15 mg total) by mouth at bedtime as needed. for sleep 30 capsule 0  . Tetrahydrozoline HCl (VISINE OP) Apply 2 drops to eye 2 (two) times daily as needed (dry eyes).    . traZODone (DESYREL) 50 MG tablet TAKE TWO TABLETS BY MOUTH AT BEDTIME 60 tablet 0  . [DISCONTINUED] amitriptyline (ELAVIL) 25 MG tablet 2 tablets by mouth at bedtime     . [DISCONTINUED] clidinium-chlordiazePOXIDE (LIBRAX) 2.5-5 MG per capsule 2 capsules by mouth every morning and 1 at bedtime     . [DISCONTINUED] escitalopram (LEXAPRO) 10 MG tablet Take 10 mg by mouth daily.       No current facility-administered medications on file prior to visit.    BP 98/70 mmHg  Pulse 108  Temp(Src) 98 F (36.7 C) (Oral)  Resp 16  Wt 105 lb 12.8 oz (47.991 kg)  SpO2 97%       Objective:   Physical Exam  General Appearance- Not in acute distress.  HEENT Eyes- Scleraeral/Conjuntiva-bilat- Not Yellow. Mouth & Throat- Normal.  Chest and Lung Exam Auscultation: Breath sounds:-Normal. Adventitious sounds:- No Adventitious sounds.  Cardiovascular Auscultation:Rythm - Regular. Heart Sounds -Normal heart sounds.  Abdomen Inspection:-Inspection Normal.  Palpation/Perucssion: Palpation and Percussion of the abdomen reveal- moderate  Tender, No Rebound tenderness, No rigidity(Guarding) and No Palpable abdominal masses.  Liver:-Normal.  Spleen:- Normal.     Back Mid lumbar spine tenderness to palpation. And some cva pain both sides. Pain on straight leg lift. Pain on lateral movements and flexion/extension of the spine.  Lower ext neurologic  L5-S1 sensation intact bilaterally. Normal patellar reflexes bilaterally. No foot drop bilaterally.    Marland Kitchen  Assessment & Plan:  Will give toradol 60 mg im today for your back pain.  Will get ua dip today and  send out urine culture. See if possible infection.  For back pain we can't get MRI due to neurostimulator consider ct of spine if pain persists.  For weakness/fatigue and abdominal pain will get cbc, cmp, tsh, amylase and lipase. Might consider ct of abdomen as well for chronic abd/pelvis pain. Although negative in summer.  For your chest congestion for 3 months get cxr today. May give antibiotic after results back.  Follow up in 2-5 days with pcp. Would recommend actually seeing pcp in 2 days(Dr. Etter Sjogren) going ahead and making appointment.  Pt specifically tell me only thing she can take is norco and hydrocodone. limtied supply norco  If pain worsens or changes abdomen or back pain then ED evaluation.  Some nausea.  Continue phenergan.

## 2015-01-28 ENCOUNTER — Ambulatory Visit (HOSPITAL_BASED_OUTPATIENT_CLINIC_OR_DEPARTMENT_OTHER)
Admission: RE | Admit: 2015-01-28 | Discharge: 2015-01-28 | Disposition: A | Discharge status: Home / Self Care | Payer: BLUE CROSS/BLUE SHIELD | Source: Ambulatory Visit | Attending: Medical | Admitting: Medical

## 2015-01-28 ENCOUNTER — Ambulatory Visit (INDEPENDENT_AMBULATORY_CARE_PROVIDER_SITE_OTHER): Payer: BLUE CROSS/BLUE SHIELD | Admitting: Family Medicine

## 2015-01-28 ENCOUNTER — Telehealth: Payer: Self-pay | Admitting: Medical

## 2015-01-28 ENCOUNTER — Ambulatory Visit (HOSPITAL_BASED_OUTPATIENT_CLINIC_OR_DEPARTMENT_OTHER)
Admission: RE | Admit: 2015-01-28 | Discharge: 2015-01-28 | Disposition: A | Discharge status: Home / Self Care | Payer: BLUE CROSS/BLUE SHIELD | Source: Ambulatory Visit | Attending: Family Medicine | Admitting: Family Medicine

## 2015-01-28 ENCOUNTER — Encounter: Payer: Self-pay | Admitting: Family Medicine

## 2015-01-28 VITALS — BP 100/56 | HR 65 | Temp 97.5°F | Wt 106.8 lb

## 2015-01-28 DIAGNOSIS — M5440 Lumbago with sciatica, unspecified side: Secondary | ICD-10-CM | POA: Diagnosis not present

## 2015-01-28 DIAGNOSIS — F419 Anxiety disorder, unspecified: Secondary | ICD-10-CM | POA: Insufficient documentation

## 2015-01-28 DIAGNOSIS — M545 Low back pain: Secondary | ICD-10-CM | POA: Diagnosis not present

## 2015-01-28 DIAGNOSIS — R110 Nausea: Secondary | ICD-10-CM

## 2015-01-28 DIAGNOSIS — R0989 Other specified symptoms and signs involving the circulatory and respiratory systems: Secondary | ICD-10-CM

## 2015-01-28 DIAGNOSIS — Z9049 Acquired absence of other specified parts of digestive tract: Secondary | ICD-10-CM | POA: Insufficient documentation

## 2015-01-28 DIAGNOSIS — R531 Weakness: Secondary | ICD-10-CM | POA: Diagnosis not present

## 2015-01-28 DIAGNOSIS — R05 Cough: Secondary | ICD-10-CM | POA: Insufficient documentation

## 2015-01-28 DIAGNOSIS — R7989 Other specified abnormal findings of blood chemistry: Secondary | ICD-10-CM

## 2015-01-28 DIAGNOSIS — R1013 Epigastric pain: Secondary | ICD-10-CM | POA: Diagnosis not present

## 2015-01-28 DIAGNOSIS — F329 Major depressive disorder, single episode, unspecified: Secondary | ICD-10-CM | POA: Insufficient documentation

## 2015-01-28 DIAGNOSIS — F32A Depression, unspecified: Secondary | ICD-10-CM | POA: Insufficient documentation

## 2015-01-28 LAB — URINE CULTURE
Colony Count: NO GROWTH
Organism ID, Bacteria: NO GROWTH

## 2015-01-28 MED ORDER — HYDROCODONE-ACETAMINOPHEN 5-325 MG PO TABS: 1.0000 | ORAL_TABLET | Freq: Four times a day (QID) | ORAL | Status: DC | PRN

## 2015-01-28 MED ORDER — IOHEXOL 300 MG/ML  SOLN
100.0000 mL | Freq: Once | INTRAMUSCULAR | Status: AC | PRN
  Administered 2015-01-28: 100 mL via INTRAVENOUS

## 2015-01-28 MED ORDER — PROMETHAZINE HCL 25 MG/ML IJ SOLN
25.0000 mg | Freq: Once | INTRAMUSCULAR | Status: AC
  Administered 2015-01-28: 25 mg via INTRAMUSCULAR

## 2015-01-28 MED ORDER — KETOROLAC TROMETHAMINE 60 MG/2ML IM SOLN
60.0000 mg | Freq: Once | INTRAMUSCULAR | Status: AC
  Administered 2015-01-28: 60 mg via INTRAMUSCULAR

## 2015-01-28 NOTE — Patient Instructions (Signed)

## 2015-01-28 NOTE — Telephone Encounter (Signed)
Referral made to endocrine

## 2015-01-28 NOTE — Addendum Note (Signed)
Addended by: Tasia Catchings on: 01/28/2015 09:31 AM   Modules accepted: Orders

## 2015-01-28 NOTE — Telephone Encounter (Signed)
Left message for pt to call back if she has any questions. 

## 2015-01-28 NOTE — Progress Notes (Signed)
Patient ID: LATIFA NOBLE, female    DOB: 1975/12/05  Age: 39 y.o. MRN: [id number]    Subjective:  Subjective HPI NATALIN BIBLE presents for abd pain and back pain f/u  Pt was seen few days ago and is no better.     Review of Systems  Constitutional: Negative for diaphoresis, appetite change, fatigue and unexpected weight change.  Eyes: Negative for pain, redness and visual disturbance.  Respiratory: Negative for cough, chest tightness, shortness of breath and wheezing.   Cardiovascular: Negative for chest pain, palpitations and leg swelling.  Gastrointestinal: Positive for abdominal pain. Negative for nausea, vomiting, diarrhea, constipation, blood in stool, abdominal distention, anal bleeding and rectal pain.  Endocrine: Negative for cold intolerance, heat intolerance, polydipsia, polyphagia and polyuria.  Genitourinary: Negative for dysuria, frequency and difficulty urinating.  Musculoskeletal: Positive for back pain. Negative for myalgias, joint swelling and arthralgias.  Neurological: Negative for dizziness, light-headedness, numbness and headaches.    History Past Medical History  Diagnosis Date  . 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 SLEEP APNEA 08/31/2007  . 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 DEPRESSION 08/31/2007  . ARTHRITIS 08/31/2007  . HYPERTENSION 08/31/2007  . Chronic interstitial cystitis 03/11/2009  . IBS 03/11/2009  . GERD 02/05/2009  . BRONCHITIS, RECURRENT 08/23/2009    Asthmatic Bronchitis-Dr. Melvyn Novas.....-HFA 75% 12/04/2008>75% 02/05/2009>75% 08/04/2009 -PFT's 01/04/2009 2.56 (86%) ratio 75, no resp to B2 and DLC0 67% > 80 after correction   . Anal fissure 03/11/2009  . RECTAL BLEEDING 03/11/2009  . COLONIC POLYPS, HX OF 07/25/2006    ADENOMATOUS POLYP  . Migraine headache   . Chronic pain   . Chronic nausea   . COPD (chronic obstructive pulmonary disease) ([place])   . Asthma   . Anxiety and depression   . Uterine cyst   .  Endometriosis   . Bowel obstruction ([place])   . Colon polyps   . Internal hemorrhoids   . Cancer (HCC)     cervical cancer  . FIBROMYALGIA 08/31/2007  . Arthritis   . Anemia   . Anxiety   . Hyperlipidemia   . Seizures ([place])     been about 1 year since last seisure per pt  . Bipolar 1 disorder ([place])   . PONV (postoperative nausea and vomiting)     She has past surgical history that includes Cholecystectomy; Abdominal hysterectomy; Pacemaker insertion; Bladder surgery; interstitial cystitis; bladder stretching x6; replaced bladder pacemaker; removal of uterine cyst and scrapped uterus; Colonoscopy; and Colonoscopy with propofol (N/A, 09/03/2014).   Her family history includes Asthma in her maternal grandmother; Cancer in her maternal grandfather and other; Emphysema in her maternal grandfather; Heart disease in her father. There is no history of Colon cancer, Esophageal cancer, Rectal cancer, or Stomach cancer.She reports that she has been smoking Cigarettes.  She has a 7 pack-year smoking history. She has never used smokeless tobacco. She reports that she drinks alcohol. She reports that she uses illicit drugs (Marijuana).  Current Outpatient Prescriptions on File Prior to Visit  Medication Sig Dispense Refill  . albuterol (VENTOLIN HFA) 108 (90 BASE) MCG/ACT inhaler Inhale 2 puffs into the lungs every 4 (four) hours as needed for wheezing or shortness of breath. 1 Inhaler 1  . BLACK COHOSH PO Take 1 tablet by mouth every evening.    . budesonide-formoterol (SYMBICORT) 160-4.5 MCG/ACT inhaler INHALE TWO PUFFS BY MOUTH TWICE DAILY 1 Inhaler 5  . cetirizine (ZYRTEC) 10 MG tablet Take 10 mg by mouth at  bedtime as needed for allergies.    . cholecalciferol (VITAMIN D) 1000 UNITS tablet Take 1,000 Units by mouth every evening.    . clonazePAM (KLONOPIN) 1 MG tablet TAKE 1 TABLET BY MOUTH TWICE DAILY AS NEEDED FOR ANXIETY 30 tablet 0  . Cyanocobalamin (VITAMIN B 12 PO) Take 1 tablet by mouth every  evening.    Marland Kitchen doxazosin (CARDURA) 2 MG tablet TAKE ONE TABLET BY MOUTH AT BEDTIME 30 tablet 5  . Estradiol (ESTRACE PO) Take 1 tablet by mouth every morning.    . folic acid (FOLVITE) 438 MCG tablet Take 400 mcg by mouth every evening.    . hyoscyamine (LEVSIN, ANASPAZ) 0.125 MG tablet Take 0.125 mg by mouth every 4 (four) hours as needed for cramping.    . Multiple Vitamin (MULTIVITAMIN WITH MINERALS) TABS tablet Take 1 tablet by mouth every evening.     . Omega-3 Fatty Acids (FISH OIL) 1200 MG CAPS Take 2 capsules by mouth every evening.    Marland Kitchen omeprazole (PRILOSEC) 40 MG capsule Take 1 capsule (40 mg total) by mouth daily. 90 capsule 3  . prochlorperazine (COMPAZINE) 10 MG tablet Take 10 mg by mouth every 6 (six) hours as needed for nausea or vomiting.     Marland Kitchen QUEtiapine (SEROQUEL) 300 MG tablet Take 1 tablet (300 mg total) by mouth 2 (two) times daily. 60 tablet 5  . sertraline (ZOLOFT) 100 MG tablet Take 1 tablet (100 mg total) by mouth daily. 90 tablet 3  . Tetrahydrozoline HCl (VISINE OP) Apply 2 drops to eye 2 (two) times daily as needed (dry eyes).    . traZODone (DESYREL) 50 MG tablet TAKE TWO TABLETS BY MOUTH AT BEDTIME 60 tablet 0  . [DISCONTINUED] amitriptyline (ELAVIL) 25 MG tablet 2 tablets by mouth at bedtime     . [DISCONTINUED] clidinium-chlordiazePOXIDE (LIBRAX) 2.5-5 MG per capsule 2 capsules by mouth every morning and 1 at bedtime     . [DISCONTINUED] escitalopram (LEXAPRO) 10 MG tablet Take 10 mg by mouth daily.       No current facility-administered medications on file prior to visit.     Objective:  Objective Physical Exam  Constitutional: She is oriented to person, place, and time. She appears well-developed and well-nourished.  HENT:  Head: Normocephalic and atraumatic.  Eyes: Conjunctivae and EOM are normal.  Neck: Normal range of motion. Neck supple. No JVD present. Carotid bruit is not present. No thyromegaly present.  Cardiovascular: Normal rate, regular rhythm  and normal heart sounds.   No murmur heard. Pulmonary/Chest: Effort normal and breath sounds normal. No respiratory distress. She has no wheezes. She has no rales. She exhibits no tenderness.  Abdominal: She exhibits distension. There is tenderness. There is guarding.  Musculoskeletal: She exhibits no edema.  Neurological: She is alert and oriented to person, place, and time. She has normal reflexes. She displays normal reflexes. No cranial nerve deficit. She exhibits normal muscle tone. Coordination normal.  Psychiatric: She has a normal mood and affect.  Nursing note and vitals reviewed.  BP 100/56 mmHg  Pulse 65  Temp(Src) 97.5 F (36.4 C) (Oral)  Wt 106 lb 12.8 oz (48.444 kg)  SpO2 98% Wt Readings from Last 3 Encounters:  01/28/15 106 lb 12.8 oz (48.444 kg)  01/27/15 105 lb 12.8 oz (47.991 kg)  08/28/14 107 lb 12.8 oz (48.898 kg)     Lab Results  Component Value Date   WBC 9.2 01/27/2015   HGB 12.8 01/27/2015   HCT 38.4 01/27/2015  PLT 384 01/27/2015   GLUCOSE 94 01/27/2015   CHOL 127 07/19/2009   TRIG 130.0 07/19/2009   HDL 42.80 07/19/2009   LDLCALC 58 07/19/2009   ALT 4* 01/27/2015   AST 10 01/27/2015   NA 139 01/27/2015   K 4.0 01/27/2015   CL 109 01/27/2015   CREATININE 1.13* 01/27/2015   BUN 11 01/27/2015   CO2 24 01/27/2015   TSH 0.330* 01/27/2015   HGBA1C 4.9 08/10/2014    No results found.   Assessment & Plan:  Plan I have discontinued Ms. Bussa's MOVIPREP, MOVIPREP, and temazepam. I have also changed her HYDROcodone-acetaminophen. Additionally, I am having her maintain her cetirizine, albuterol, budesonide-formoterol, multivitamin with minerals, prochlorperazine, omeprazole, sertraline, Cyanocobalamin (VITAMIN B 12 PO), hyoscyamine, Fish Oil, cholecalciferol, BLACK COHOSH PO, folic acid, Estradiol (ESTRACE PO), Tetrahydrozoline HCl (VISINE OP), doxazosin, QUEtiapine, traZODone, and clonazePAM. We administered promethazine.  Meds ordered this encounter   Medications  . HYDROcodone-acetaminophen (NORCO) 5-325 MG tablet    Sig: Take 1-2 tablets by mouth every 6 (six) hours as needed for moderate pain.    Dispense:  30 tablet    Refill:  0  . promethazine (PHENERGAN) injection 25 mg    Sig:     Problem List Items Addressed This Visit    None    Visit Diagnoses    Low back pain with sciatica, sciatica laterality unspecified, unspecified back pain laterality    -  Primary    Relevant Medications    HYDROcodone-acetaminophen (NORCO) 5-325 MG tablet    Other Relevant Orders    DG Lumbar Spine Complete (Completed)    Abdominal pain, epigastric        Relevant Medications    HYDROcodone-acetaminophen (NORCO) 5-325 MG tablet    Other Relevant Orders    CT Abdomen W Contrast (Completed)    Nausea        Relevant Medications    promethazine (PHENERGAN) injection 25 mg (Completed)     if pain worsens -- go to er CT abd normal---- refer to GI--- no more pain meds from this office Follow-up: Return if symptoms worsen or fail to improve.  Garnet Koyanagi, DO

## 2015-01-28 NOTE — Progress Notes (Signed)
Pre visit review using our clinic review tool, if applicable. No additional management support is needed unless otherwise documented below in the visit note. 

## 2015-01-29 ENCOUNTER — Other Ambulatory Visit: Payer: Self-pay

## 2015-01-29 ENCOUNTER — Telehealth: Payer: Self-pay

## 2015-01-29 MED ORDER — RANITIDINE HCL 150 MG PO TABS: 150.0000 mg | ORAL_TABLET | Freq: Two times a day (BID) | ORAL | Status: DC

## 2015-01-29 NOTE — Telephone Encounter (Signed)
Notes Recorded by Rosalita Chessman, DO on 01/29/2015 at 3:19 PM No more pain meds from here

## 2015-01-30 ENCOUNTER — Other Ambulatory Visit: Payer: Self-pay | Admitting: Family Medicine

## 2015-02-04 ENCOUNTER — Telehealth: Payer: Self-pay | Admitting: Family Medicine

## 2015-02-04 NOTE — Telephone Encounter (Signed)
When we saw her it was a completely different problem--- abd pain and back pain She did not have sinus congestion

## 2015-02-04 NOTE — Telephone Encounter (Signed)
Relation to GB:KORJ Call back [place]: [facility] [address] [place] [phone] (Phone) [phone] (Fax)         Reason for call:   Patient states PCP would like in a antibiotics if she did not get better from last OV 01/28/15, patient is congested coughing up mucus and wheezing.

## 2015-02-04 NOTE — Telephone Encounter (Signed)
Spoke with patient and I reviewed the results with her and she verbalized understanding, she wanted to know the status of her referral.  I gave her the number to endo to follow up on the referral.     KP

## 2015-02-04 NOTE — Telephone Encounter (Signed)
To MD to review and advise      KP 

## 2015-02-04 NOTE — Telephone Encounter (Signed)
Please advise      KP 

## 2015-02-04 NOTE — Telephone Encounter (Signed)
What symptoms is she having now?   The urine culture was completely neg-- no infection.

## 2015-02-04 NOTE — Telephone Encounter (Signed)
Pt called for results of urine culture and to state that she is still sick from last week and was told to call if she wasn't better in a few days and an abx would be called in for her. [facility]: [facility] in [place] # to call pt [phone]

## 2015-02-04 NOTE — Telephone Encounter (Signed)
Message left to call the office.    KP 

## 2015-02-05 NOTE — Telephone Encounter (Signed)
Msg left to call the office     KP 

## 2015-02-05 NOTE — Telephone Encounter (Signed)
Pt returning call. [facility] # [phone].

## 2015-02-05 NOTE — Telephone Encounter (Signed)
Message left to call the office

## 2015-02-05 NOTE — Telephone Encounter (Signed)
Patient is aware, she has been advised to try Mucinex, Rhinocort, Nasocort and Flonase and f/u next week if no improvement.     KP

## 2015-02-08 ENCOUNTER — Other Ambulatory Visit: Payer: Self-pay | Admitting: Family Medicine

## 2015-02-08 NOTE — Telephone Encounter (Signed)
Last seen 01/28/15 and filled 01/11/15 #[id number]   Please advise     KP

## 2015-02-11 ENCOUNTER — Ambulatory Visit: Payer: BLUE CROSS/BLUE SHIELD | Admitting: Family Medicine

## 2015-02-16 ENCOUNTER — Encounter: Payer: Self-pay | Admitting: Endocrinology

## 2015-02-16 ENCOUNTER — Ambulatory Visit (INDEPENDENT_AMBULATORY_CARE_PROVIDER_SITE_OTHER): Payer: BLUE CROSS/BLUE SHIELD | Admitting: Endocrinology

## 2015-02-16 VITALS — BP 108/80 | HR 83 | Temp 98.2°F | Ht 63.5 in | Wt 112.0 lb

## 2015-02-16 DIAGNOSIS — E0590 Thyrotoxicosis, unspecified without thyrotoxic crisis or storm: Secondary | ICD-10-CM

## 2015-02-16 MED ORDER — METHIMAZOLE 5 MG PO TABS: 5.0000 mg | ORAL_TABLET | ORAL | Status: DC

## 2015-02-16 NOTE — Progress Notes (Signed)
Subjective:    Patient ID: [name], female    DOB: [birth year], 39 y.o.   MRN: [id number]  HPI 1 month ago, pt was found to have a slightly suppressed TSH.  she has never been on therapy for this.  He has never had XRT to the anterior neck, or thyroid surgery.  she has never had dedicated thyroid imaging.  she does not consume kelp or any other prescribed or non-prescribed thyroid medication.  she has never been on amiodarone.  She has slight tremor of the hands, and assoc headache.  She has had TAH. Past Medical History  Diagnosis Date  . 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 SLEEP APNEA 08/31/2007  . 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 DEPRESSION 08/31/2007  . ARTHRITIS 08/31/2007  . HYPERTENSION 08/31/2007  . Chronic interstitial cystitis 03/11/2009  . IBS 03/11/2009  . GERD 02/05/2009  . BRONCHITIS, RECURRENT 08/23/2009    Asthmatic Bronchitis-Dr. Melvyn Novas.....-HFA 75% 12/04/2008>75% 02/05/2009>75% 08/04/2009 -PFT's 01/04/2009 2.56 (86%) ratio 75, no resp to B2 and DLC0 67% > 80 after correction   . Anal fissure 03/11/2009  . RECTAL BLEEDING 03/11/2009  . COLONIC POLYPS, HX OF 07/25/2006    ADENOMATOUS POLYP  . Migraine headache   . Chronic pain   . Chronic nausea   . COPD (chronic obstructive pulmonary disease) ([place])   . Asthma   . Anxiety and depression   . Uterine cyst   . Endometriosis   . Bowel obstruction ([place])   . Colon polyps   . Internal hemorrhoids   . Cancer (HCC)     cervical cancer  . FIBROMYALGIA 08/31/2007  . Arthritis   . Anemia   . Anxiety   . Hyperlipidemia   . Seizures ([place])     been about 1 year since last seisure per pt  . Bipolar 1 disorder ([place])   . PONV (postoperative nausea and vomiting)     Past Surgical History  Procedure Laterality Date  . Cholecystectomy    . Abdominal hysterectomy    . Pacemaker insertion      in hip for interstitial cystitis  . Bladder surgery      stimulator placed and stretching   .  Interstitial cystitis    . Bladder stretching x6    . Replaced bladder pacemaker    . Removal of uterine cyst and scrapped uterus    . Colonoscopy    . Colonoscopy with propofol N/A 09/03/2014    Procedure: COLONOSCOPY WITH PROPOFOL;  Surgeon: Milus Banister, MD;  Location: [facility];  Service: Endoscopy;  Laterality: N/A;    Social History   Social History  . Marital Status: Married    Spouse Name: N/A  . Number of Children: 2  . Years of Education: N/A   Occupational History  .     Social History Main Topics  . Smoking status: Current Every Day Smoker -- 0.50 packs/day for 14 years    Types: Cigarettes  . Smokeless tobacco: Never Used     Comment: trying to quit  . Alcohol Use: 0.0 oz/week    0 Standard drinks or equivalent per week     Comment: rare wine   . Drug Use: Yes    Special: Marijuana     Comment: occasional, pt states today 06/19/12 no drug use  . Sexual Activity: Not on file   Other Topics Concern  . Not on file   Social History Narrative   Homemaker  Daily Caffeine Use-Mtn. Dew          Current Outpatient Prescriptions on File Prior to Visit  Medication Sig Dispense Refill  . BLACK COHOSH PO Take 1 tablet by mouth every evening.    . cetirizine (ZYRTEC) 10 MG tablet Take 10 mg by mouth at bedtime as needed for allergies.    . cholecalciferol (VITAMIN D) 1000 UNITS tablet Take 1,000 Units by mouth every evening.    . clonazePAM (KLONOPIN) 1 MG tablet TAKE 1 TABLET BY MOUTH TWICE DAILY AS NEEDED FOR ANXIETY 30 tablet 0  . Cyanocobalamin (VITAMIN B 12 PO) Take 1 tablet by mouth every evening.    Marland Kitchen doxazosin (CARDURA) 2 MG tablet TAKE ONE TABLET BY MOUTH AT BEDTIME 30 tablet 5  . Estradiol (ESTRACE PO) Take 1 tablet by mouth every morning.    . folic acid (FOLVITE) 322 MCG tablet Take 400 mcg by mouth every evening.    Marland Kitchen HYDROcodone-acetaminophen (NORCO) 5-325 MG tablet Take 1-2 tablets by mouth every 6 (six) hours as needed for moderate pain. 30  tablet 0  . hyoscyamine (LEVSIN, ANASPAZ) 0.125 MG tablet Take 0.125 mg by mouth every 4 (four) hours as needed for cramping.    . Multiple Vitamin (MULTIVITAMIN WITH MINERALS) TABS tablet Take 1 tablet by mouth every evening.     . Omega-3 Fatty Acids (FISH OIL) 1200 MG CAPS Take 2 capsules by mouth every evening.    Marland Kitchen omeprazole (PRILOSEC) 40 MG capsule Take 1 capsule (40 mg total) by mouth daily. 90 capsule 3  . prochlorperazine (COMPAZINE) 10 MG tablet Take 10 mg by mouth every 6 (six) hours as needed for nausea or vomiting.     Marland Kitchen QUEtiapine (SEROQUEL) 300 MG tablet Take 1 tablet (300 mg total) by mouth 2 (two) times daily. 60 tablet 5  . ranitidine (ZANTAC) 150 MG tablet Take 1 tablet (150 mg total) by mouth 2 (two) times daily. 60 tablet 2  . sertraline (ZOLOFT) 100 MG tablet Take 1 tablet (100 mg total) by mouth daily. 90 tablet 3  . SYMBICORT 160-4.5 MCG/ACT inhaler INHALE TWO PUFFS BY MOUTH TWICE DAILY 1 Inhaler 5  . Tetrahydrozoline HCl (VISINE OP) Apply 2 drops to eye 2 (two) times daily as needed (dry eyes).    . traZODone (DESYREL) 50 MG tablet TAKE TWO TABLETS BY MOUTH AT BEDTIME 60 tablet 0  . VENTOLIN HFA 108 (90 BASE) MCG/ACT inhaler INHALE TWO PUFFS BY MOUTH EVERY 4 HOURS AS NEEDED FOR WHEEZING OR SHORTNESS OF BREATH 18 each 5  . [DISCONTINUED] amitriptyline (ELAVIL) 25 MG tablet 2 tablets by mouth at bedtime     . [DISCONTINUED] clidinium-chlordiazePOXIDE (LIBRAX) 2.5-5 MG per capsule 2 capsules by mouth every morning and 1 at bedtime     . [DISCONTINUED] escitalopram (LEXAPRO) 10 MG tablet Take 10 mg by mouth daily.       No current facility-administered medications on file prior to visit.    Allergies  Allergen Reactions  . Abilify [Aripiprazole] Swelling, Other (See Comments) and Palpitations    tremors Throat swelling, tremors  . Metoclopramide Hcl Other (See Comments)    Causes seizures  . Tramadol Swelling, Other (See Comments) and Rash    Throat swelling, tremors   . Ambien [Zolpidem Tartrate] Other (See Comments)    hallucinations  . Eszopiclone Other (See Comments)    Hallucinations, hyper, bad taste in mouth   . Amitriptyline     Other reaction(s): Angioedema (ALLERGY/intolerance)  . Metoclopramide  Other reaction(s): Other (See Comments) Seizures  . Other Swelling    Acetaminophen #3 Acetaminophen #3  . Varenicline Other (See Comments)    Suicidal thoughts  . Buprenorphine Hcl Itching and Hives  . Demerol [Meperidine] Rash  . Emetrol Itching, Rash and Hives    Other reaction(s): Rash (ALLERGY/intolerance)  . Morphine And Related Hives and Itching  . Propoxyphene N-Acetaminophen Hives, Itching and Rash    Other reaction(s): Rash (ALLERGY/intolerance)    Family History  Problem Relation Age of Onset  . Heart disease Father   . Asthma Maternal Grandmother   . Emphysema Maternal Grandfather   . Cancer Maternal Grandfather     Lung Cancer  . Cancer Other     Lung Cancer-Aunt  . Colon cancer Neg Hx   . Esophageal cancer Neg Hx   . Rectal cancer Neg Hx   . Stomach cancer Neg Hx   . Thyroid disease Neg Hx     BP 108/80 mmHg  Pulse 83  Temp(Src) 98.2 F (36.8 C) (Oral)  Ht 5' 3.5" (1.613 m)  Wt 112 lb (50.803 kg)  BMI 19.53 kg/m2  SpO2 96%  Review of Systems denies weight loss, hoarseness, visual loss, polyuria, muscle weakness, numbness, and rhinorrhea. She has intermittent palpitations, diarrhea, and doe.  She has excessive diaphoresis, easy bruising, heat intolerance, and anxiety.    Objective:   Physical Exam VS: see vs page GEN: no distress HEAD: head: no deformity eyes: no periorbital swelling, no proptosis external nose and ears are normal mouth: no lesion seen NECK: thyroid is minimally enlarged, with slightly irregular surface.  No nodule is palpable CHEST WALL: no deformity LUNGS:  Clear to auscultation CV: reg rate and rhythm, no murmur ABD: abdomen is soft, nontender.  no hepatosplenomegaly.  not  distended.  no hernia MUSCULOSKELETAL: muscle bulk and strength are grossly normal.  no obvious joint swelling.  gait is normal and steady EXTEMITIES: no deformity.  no edema PULSES: no carotid bruit NEURO:  cn 2-12 grossly intact.   readily moves all 4's.  sensation is intact to touch on al 4's.  Slight tremor of the hands SKIN:  Normal texture and temperature.  No rash or suspicious lesion is visible.   NODES:  None palpable at the neck PSYCH: alert, well-oriented.  Does not appear anxious nor depressed.   Lab Results  Component Value Date   TSH 0.330* 01/27/2015   I have reviewed outside records, and summarized: Pt was noted to have suppressed TSH, and referred here.     Assessment & Plan:  Hyperthyroidism, new, mild.  uncertain etiology, but is usually caused by a small multinodular goiter.  We discussed observation vs low-dose tapazole.  i told her sxs are prob not thyroid-related.  Still, she wants to try normalization of TSH. So I have agreed.    Patient is advised the following: Patient Instructions  i have sent a prescription to your pharmacy, to slow the thyroid. if ever you have fever while taking methimazole, stop it and call us, even if the reason is obvious, because of the risk of a rare side-effect. Please come back for a follow-up appointment in 1 month.

## 2015-02-16 NOTE — Patient Instructions (Signed)
i have sent a prescription to your pharmacy, to slow the thyroid. if ever you have fever while taking methimazole, stop it and call us, even if the reason is obvious, because of the risk of a rare side-effect. Please come back for a follow-up appointment in 1 month.

## 2015-02-17 DIAGNOSIS — E0590 Thyrotoxicosis, unspecified without thyrotoxic crisis or storm: Secondary | ICD-10-CM | POA: Insufficient documentation

## 2015-02-19 ENCOUNTER — Ambulatory Visit: Payer: BLUE CROSS/BLUE SHIELD | Admitting: Family Medicine

## 2015-02-22 ENCOUNTER — Other Ambulatory Visit: Payer: Self-pay | Admitting: Family Medicine

## 2015-02-23 ENCOUNTER — Encounter: Payer: Self-pay | Admitting: Family Medicine

## 2015-02-23 ENCOUNTER — Ambulatory Visit (INDEPENDENT_AMBULATORY_CARE_PROVIDER_SITE_OTHER): Payer: BLUE CROSS/BLUE SHIELD | Admitting: Family Medicine

## 2015-02-23 VITALS — BP 98/64 | HR 78 | Temp 97.7°F | Wt 109.6 lb

## 2015-02-23 DIAGNOSIS — J40 Bronchitis, not specified as acute or chronic: Secondary | ICD-10-CM

## 2015-02-23 DIAGNOSIS — F411 Generalized anxiety disorder: Secondary | ICD-10-CM | POA: Diagnosis not present

## 2015-02-23 MED ORDER — GUAIFENESIN-CODEINE 100-10 MG/5ML PO SYRP: ORAL_SOLUTION | ORAL | Status: DC

## 2015-02-23 MED ORDER — AZITHROMYCIN 250 MG PO TABS: ORAL_TABLET | ORAL | Status: DC

## 2015-02-23 MED ORDER — PREDNISONE 10 MG PO TABS: ORAL_TABLET | ORAL | Status: DC

## 2015-02-23 MED ORDER — METHYLPREDNISOLONE ACETATE 80 MG/ML IJ SUSP
80.0000 mg | Freq: Once | INTRAMUSCULAR | Status: AC
  Administered 2015-02-23: 80 mg via INTRAMUSCULAR

## 2015-02-23 MED ORDER — CLONAZEPAM 1 MG PO TABS: 1.0000 mg | ORAL_TABLET | Freq: Two times a day (BID) | ORAL | Status: DC | PRN

## 2015-02-23 NOTE — Progress Notes (Signed)
Pre visit review using our clinic review tool, if applicable. No additional management support is needed unless otherwise documented below in the visit note. 

## 2015-02-23 NOTE — Progress Notes (Signed)
Patient ID: [name], female    DOB: Dec 12, 1975  Age: 39 y.o. MRN: [id number]    Subjective:  Subjective HPI [name] presents for cough and congestion x several weeks.  No otc meds.  No fever + productive cough She is also here for 74m depression f/u  Review of Systems  Constitutional: Positive for chills. Negative for fever.  HENT: Positive for congestion, postnasal drip, rhinorrhea and sinus pressure.   Respiratory: Positive for cough, chest tightness, shortness of breath and wheezing.   Cardiovascular: Negative for chest pain, palpitations and leg swelling.  Allergic/Immunologic: Negative for environmental allergies.    History Past Medical History  Diagnosis Date  . 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 SLEEP APNEA 08/31/2007  . 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 DEPRESSION 08/31/2007  . ARTHRITIS 08/31/2007  . HYPERTENSION 08/31/2007  . Chronic interstitial cystitis 03/11/2009  . IBS 03/11/2009  . GERD 02/05/2009  . BRONCHITIS, RECURRENT 08/23/2009    Asthmatic Bronchitis-Dr. Melvyn Novas.....-HFA 75% 12/04/2008>75% 02/05/2009>75% 08/04/2009 -PFT's 01/04/2009 2.56 (86%) ratio 75, no resp to B2 and DLC0 67% > 80 after correction   . Anal fissure 03/11/2009  . RECTAL BLEEDING 03/11/2009  . COLONIC POLYPS, HX OF 07/25/2006    ADENOMATOUS POLYP  . Migraine headache   . Chronic pain   . Chronic nausea   . COPD (chronic obstructive pulmonary disease) ([place])   . Asthma   . Anxiety and depression   . Uterine cyst   . Endometriosis   . Bowel obstruction ([place])   . Colon polyps   . Internal hemorrhoids   . Cancer (HCC)     cervical cancer  . FIBROMYALGIA 08/31/2007  . Arthritis   . Anemia   . Anxiety   . Hyperlipidemia   . Seizures ([place])     been about 1 year since last seisure per pt  . Bipolar 1 disorder ([place])   . PONV (postoperative nausea and vomiting)     She has past surgical history that includes Cholecystectomy; Abdominal hysterectomy;  Pacemaker insertion; Bladder surgery; interstitial cystitis; bladder stretching x6; replaced bladder pacemaker; removal of uterine cyst and scrapped uterus; Colonoscopy; and Colonoscopy with propofol (N/A, 09/03/2014).   Her family history includes Asthma in her maternal grandmother; Cancer in her maternal grandfather and other; Emphysema in her maternal grandfather; Heart disease in her father. There is no history of Colon cancer, Esophageal cancer, Rectal cancer, Stomach cancer, or Thyroid disease.She reports that she has been smoking Cigarettes.  She has a 7 pack-year smoking history. She has never used smokeless tobacco. She reports that she drinks alcohol. She reports that she uses illicit drugs (Marijuana).  Current Outpatient Prescriptions on File Prior to Visit  Medication Sig Dispense Refill  . cetirizine (ZYRTEC) 10 MG tablet Take 10 mg by mouth at bedtime as needed for allergies.    . Cyanocobalamin (VITAMIN B 12 PO) Take 1 tablet by mouth every evening.    Marland Kitchen doxazosin (CARDURA) 2 MG tablet TAKE ONE TABLET BY MOUTH AT BEDTIME 30 tablet 5  . HYDROcodone-acetaminophen (NORCO) 5-325 MG tablet Take 1-2 tablets by mouth every 6 (six) hours as needed for moderate pain. 30 tablet 0  . hyoscyamine (LEVSIN, ANASPAZ) 0.125 MG tablet Take 0.125 mg by mouth every 4 (four) hours as needed for cramping.    . methimazole (TAPAZOLE) 5 MG tablet Take 1 tablet (5 mg total) by mouth 3 (three) times a week. 13 tablet 2  . omeprazole (PRILOSEC) 40 MG  capsule Take 1 capsule (40 mg total) by mouth daily. 90 capsule 3  . prochlorperazine (COMPAZINE) 10 MG tablet Take 10 mg by mouth every 6 (six) hours as needed for nausea or vomiting.     Marland Kitchen QUEtiapine (SEROQUEL) 300 MG tablet Take 1 tablet (300 mg total) by mouth 2 (two) times daily. 60 tablet 5  . ranitidine (ZANTAC) 150 MG tablet Take 1 tablet (150 mg total) by mouth 2 (two) times daily. 60 tablet 2  . sertraline (ZOLOFT) 100 MG tablet Take 1 tablet (100 mg  total) by mouth daily. 90 tablet 3  . SYMBICORT 160-4.5 MCG/ACT inhaler INHALE TWO PUFFS BY MOUTH TWICE DAILY 1 Inhaler 5  . Tetrahydrozoline HCl (VISINE OP) Apply 2 drops to eye 2 (two) times daily as needed (dry eyes).    . traZODone (DESYREL) 50 MG tablet TAKE TWO TABLETS BY MOUTH AT BEDTIME 60 tablet 0  . VENTOLIN HFA 108 (90 BASE) MCG/ACT inhaler INHALE TWO PUFFS BY MOUTH EVERY 4 HOURS AS NEEDED FOR WHEEZING OR SHORTNESS OF BREATH 18 each 5  . [DISCONTINUED] amitriptyline (ELAVIL) 25 MG tablet 2 tablets by mouth at bedtime     . [DISCONTINUED] clidinium-chlordiazePOXIDE (LIBRAX) 2.5-5 MG per capsule 2 capsules by mouth every morning and 1 at bedtime     . [DISCONTINUED] escitalopram (LEXAPRO) 10 MG tablet Take 10 mg by mouth daily.       No current facility-administered medications on file prior to visit.     Objective:  Objective Physical Exam  Constitutional: She is oriented to person, place, and time. She appears well-developed and well-nourished.  HENT:  Right Ear: External ear normal.  Left Ear: External ear normal.  + PND + errythema  Eyes: Conjunctivae are normal. Right eye exhibits no discharge. Left eye exhibits no discharge.  Cardiovascular: Normal rate, regular rhythm and normal heart sounds.   No murmur heard. Pulmonary/Chest: Effort normal and breath sounds normal. No respiratory distress. She has no wheezes. She has no rales. She exhibits no tenderness.  Musculoskeletal: She exhibits no edema.  Lymphadenopathy:    She has cervical adenopathy.  Neurological: She is alert and oriented to person, place, and time.  Nursing note and vitals reviewed.  BP 98/64 mmHg  Pulse 78  Temp(Src) 97.7 F (36.5 C) (Oral)  Wt 109 lb 9.6 oz (49.714 kg)  SpO2 96% Wt Readings from Last 3 Encounters:  02/23/15 109 lb 9.6 oz (49.714 kg)  02/16/15 112 lb (50.803 kg)  01/28/15 106 lb 12.8 oz (48.444 kg)     Lab Results  Component Value Date   WBC 9.2 01/27/2015   HGB 12.8  01/27/2015   HCT 38.4 01/27/2015   PLT 384 01/27/2015   GLUCOSE 94 01/27/2015   CHOL 127 07/19/2009   TRIG 130.0 07/19/2009   HDL 42.80 07/19/2009   LDLCALC 58 07/19/2009   ALT 4* 01/27/2015   AST 10 01/27/2015   NA 139 01/27/2015   K 4.0 01/27/2015   CL 109 01/27/2015   CREATININE 1.13* 01/27/2015   BUN 11 01/27/2015   CO2 24 01/27/2015   TSH 0.330* 01/27/2015   HGBA1C 4.9 08/10/2014    Dg Chest 2 View  01/28/2015  CLINICAL DATA:  Chest congestion with productive cough for 3 months. Initial encounter. EXAM: CHEST  2 VIEW COMPARISON:  09/10/2013. FINDINGS: The heart size and mediastinal contours are stable. The lungs are clear. There is mild chronic central airway thickening without significant hyperinflation. There is no pleural effusion or pneumothorax.  The bones appear unremarkable. Cholecystectomy clips are noted. IMPRESSION: Mild chronic central airway thickening. No acute cardiopulmonary process. Electronically Signed   By: Richardean Sale M.D.   On: 01/28/2015 19:09   Dg Lumbar Spine Complete  01/28/2015  CLINICAL DATA:  Low back pain radiating into both legs. No known injury. Bladder stimulator. EXAM: LUMBAR SPINE - COMPLETE 4+ VIEW COMPARISON:  Abdominal CT 09/10/2013. Pelvic radiographs 06/15/2013. FINDINGS: There are 5 lumbar type vertebral bodies. The alignment is normal. The disc spaces are preserved. There is no evidence of acute fracture or pars defect. Mild facet degenerative changes are present inferiorly. Cholecystectomy clips and left pelvic stimulator are noted. IMPRESSION: No acute osseous findings or malalignment. Electronically Signed   By: Richardean Sale M.D.   On: 01/28/2015 19:12   Ct Abdomen W Contrast  01/28/2015  CLINICAL DATA:  Epigastric abdominal pain, weakness and nausea. Prior cholecystectomy. EXAM: CT ABDOMEN WITH CONTRAST TECHNIQUE: Multidetector CT imaging of the abdomen was performed using the standard protocol following bolus administration of  intravenous contrast. CONTRAST:  164mL OMNIPAQUE IOHEXOL 300 MG/ML  SOLN COMPARISON:  09/10/2013 CT abdomen/pelvis. FINDINGS: Lower chest: Lung bases are motion degraded there is stable minimal scarring versus atelectasis in the bilateral lower lobe bases. Hepatobiliary: Normal liver with no liver mass. Status post cholecystectomy. No intrahepatic biliary ductal dilatation. Common bile duct diameter of 8 mm, unchanged and within normal post cholecystectomy limits. Pancreas: Normal, with no mass or duct dilation. Spleen: Normal size. No mass. Adrenals/Urinary Tract: Normal adrenals. Normal kidneys, with no hydronephrosis and no renal mass. Stomach/Bowel: Grossly normal stomach. Visualized small and large bowel is normal caliber, with no bowel wall thickening. Oral contrast progresses to the visualized distal colon. Vascular/Lymphatic: Normal caliber abdominal aorta. Patent portal, splenic, hepatic and renal veins. No pathologically enlarged lymph nodes in the abdomen. Other: No pneumoperitoneum, ascites or focal fluid collection. Musculoskeletal: No aggressive appearing focal osseous lesions. Partially visualized is battery pack from spinal stimulator in the left gluteal subcutaneous soft tissues. IMPRESSION: IMPRESSION No acute CT abnormality in the abdomen. Status post cholecystectomy. Bile ducts are within expected post cholecystectomy limits. No evidence of bowel obstruction or acute bowel inflammation in the abdomen. Electronically Signed   By: Ilona Sorrel M.D.   On: 01/28/2015 18:21     Assessment & Plan:  Plan I have discontinued Ms. Mcenery's multivitamin with minerals, Fish Oil, cholecalciferol, BLACK COHOSH PO, folic acid, and Estradiol (ESTRACE PO). I have also changed her clonazePAM. Additionally, I am having her start on azithromycin, predniSONE, and guaiFENesin-codeine. Lastly, I am having her maintain her cetirizine, prochlorperazine, omeprazole, sertraline, Cyanocobalamin (VITAMIN B 12 PO),  hyoscyamine, Tetrahydrozoline HCl (VISINE OP), QUEtiapine, HYDROcodone-acetaminophen, ranitidine, SYMBICORT, VENTOLIN HFA, traZODone, methimazole, and doxazosin. We administered methylPREDNISolone acetate.  Meds ordered this encounter  Medications  . azithromycin (ZITHROMAX Z-PAK) 250 MG tablet    Sig: As directed    Dispense:  6 each    Refill:  0  . predniSONE (DELTASONE) 10 MG tablet    Sig: 3 po qd for 3 days then 2 po qd for 3 days the 1 po qd for 3 days    Dispense:  18 tablet    Refill:  0  . guaiFENesin-codeine (ROBITUSSIN AC) 100-10 MG/5ML syrup    Sig: 1-2 tsp po qhs prn    Dispense:  120 mL    Refill:  0  . clonazePAM (KLONOPIN) 1 MG tablet    Sig: Take 1 tablet (1 mg total) by mouth 2 (  two) times daily as needed. for anxiety    Dispense:  30 tablet    Refill:  0    Psych to take over -- Carly -- in [employer]  . methylPREDNISolone acetate (DEPO-MEDROL) injection 80 mg    Sig:     Problem List Items Addressed This Visit    None    Visit Diagnoses    Bronchitis    -  Primary    Relevant Medications    azithromycin (ZITHROMAX Z-PAK) 250 MG tablet    predniSONE (DELTASONE) 10 MG tablet    guaiFENesin-codeine (ROBITUSSIN AC) 100-10 MG/5ML syrup    methylPREDNISolone acetate (DEPO-MEDROL) injection 80 mg (Completed)    Generalized anxiety disorder        Relevant Medications    clonazePAM (KLONOPIN) 1 MG tablet       Follow-up: Return if symptoms worsen or fail to improve.  Garnet Koyanagi, DO

## 2015-02-23 NOTE — Patient Instructions (Signed)

## 2015-03-01 ENCOUNTER — Telehealth: Payer: Self-pay | Admitting: Family Medicine

## 2015-03-01 DIAGNOSIS — R059 Cough, unspecified: Secondary | ICD-10-CM

## 2015-03-01 DIAGNOSIS — R05 Cough: Secondary | ICD-10-CM

## 2015-03-01 NOTE — Telephone Encounter (Signed)
Caller name: Self   Can be reached: [phone]  Pharmacy: [facility] [address] [place] [phone] (Phone) [phone] (Fax)         Reason for call: Patient requesting refill on guaiFENesin-codeine (ROBITUSSIN AC) 100-10 MG/5ML syrup [id number]  So that she can take it day and night

## 2015-03-01 NOTE — Telephone Encounter (Signed)
Rx filled on 02/23/15 #120. Please advise     KP

## 2015-03-01 NOTE — Telephone Encounter (Signed)
Tried calling the patient and their was no answer.      KP

## 2015-03-01 NOTE — Telephone Encounter (Signed)
She needs cxr and ov

## 2015-03-01 NOTE — Telephone Encounter (Signed)
Pt called in to follow up on her Rx request. Pt says that her coughing got worse so she took the medication prescribed more often then she was told to by provider.

## 2015-03-01 NOTE — Telephone Encounter (Signed)
Patient aware and verbalized understanding, Cxr order in but she did not schedule the apt.     KP

## 2015-03-05 ENCOUNTER — Other Ambulatory Visit: Payer: Self-pay | Admitting: Family Medicine

## 2015-03-09 ENCOUNTER — Telehealth: Payer: Self-pay | Admitting: Family Medicine

## 2015-03-09 NOTE — Telephone Encounter (Signed)
Last filled: 02/09/15 Amt: 60,0 Last OV: 02/23/15  Please advise.

## 2015-03-10 NOTE — Telephone Encounter (Signed)
Refill sent in

## 2015-03-16 ENCOUNTER — Other Ambulatory Visit: Payer: Self-pay | Admitting: Family Medicine

## 2015-03-18 ENCOUNTER — Ambulatory Visit (INDEPENDENT_AMBULATORY_CARE_PROVIDER_SITE_OTHER): Payer: BLUE CROSS/BLUE SHIELD | Admitting: Endocrinology

## 2015-03-18 VITALS — Ht 63.5 in

## 2015-03-18 DIAGNOSIS — E0590 Thyrotoxicosis, unspecified without thyrotoxic crisis or storm: Secondary | ICD-10-CM

## 2015-03-18 DIAGNOSIS — Z0289 Encounter for other administrative examinations: Secondary | ICD-10-CM

## 2015-03-22 ENCOUNTER — Other Ambulatory Visit: Payer: Self-pay | Admitting: Family Medicine

## 2015-03-22 NOTE — Telephone Encounter (Signed)
Last seen and filled 02/23/15 #30  Please advise      KP

## 2015-03-28 NOTE — Progress Notes (Signed)
   Subjective:    Patient ID: [name], female    DOB: [birth year], 39 y.o.   MRN: [id number]  HPI No show   Review of Systems     Objective:   Physical Exam        Assessment & Plan:

## 2015-03-31 ENCOUNTER — Ambulatory Visit (INDEPENDENT_AMBULATORY_CARE_PROVIDER_SITE_OTHER): Payer: BLUE CROSS/BLUE SHIELD | Admitting: Medical

## 2015-03-31 ENCOUNTER — Ambulatory Visit (HOSPITAL_BASED_OUTPATIENT_CLINIC_OR_DEPARTMENT_OTHER)
Admission: RE | Admit: 2015-03-31 | Discharge: 2015-03-31 | Disposition: A | Discharge status: Home / Self Care | Payer: BLUE CROSS/BLUE SHIELD | Source: Ambulatory Visit | Attending: Medical | Admitting: Medical

## 2015-03-31 ENCOUNTER — Encounter: Payer: Self-pay | Admitting: Medical

## 2015-03-31 VITALS — BP 100/64 | HR 92 | Temp 97.8°F | Ht 63.5 in | Wt 107.8 lb

## 2015-03-31 DIAGNOSIS — Z87891 Personal history of nicotine dependence: Secondary | ICD-10-CM

## 2015-03-31 DIAGNOSIS — R05 Cough: Secondary | ICD-10-CM | POA: Diagnosis not present

## 2015-03-31 DIAGNOSIS — J0101 Acute recurrent maxillary sinusitis: Secondary | ICD-10-CM

## 2015-03-31 DIAGNOSIS — J209 Acute bronchitis, unspecified: Secondary | ICD-10-CM

## 2015-03-31 DIAGNOSIS — Z720 Tobacco use: Secondary | ICD-10-CM | POA: Diagnosis not present

## 2015-03-31 DIAGNOSIS — J40 Bronchitis, not specified as acute or chronic: Secondary | ICD-10-CM

## 2015-03-31 DIAGNOSIS — R0989 Other specified symptoms and signs involving the circulatory and respiratory systems: Secondary | ICD-10-CM | POA: Diagnosis not present

## 2015-03-31 DIAGNOSIS — M25571 Pain in right ankle and joints of right foot: Secondary | ICD-10-CM | POA: Insufficient documentation

## 2015-03-31 LAB — CBC WITH DIFFERENTIAL/PLATELET
Basophils Absolute: 0 10*3/uL (ref 0.0–0.1)
Basophils Relative: 0.4 % (ref 0.0–3.0)
Eosinophils Absolute: 0.2 10*3/uL (ref 0.0–0.7)
Eosinophils Relative: 2.8 % (ref 0.0–5.0)
HCT: 41 % (ref 36.0–46.0)
Hemoglobin: 13.3 g/dL (ref 12.0–15.0)
Lymphocytes Relative: 35.4 % (ref 12.0–46.0)
Lymphs Abs: 3.1 10*3/uL (ref 0.7–4.0)
MCHC: 32.5 g/dL (ref 30.0–36.0)
MCV: 94.5 fl (ref 78.0–100.0)
Monocytes Absolute: 0.4 10*3/uL (ref 0.1–1.0)
Monocytes Relative: 4.3 % (ref 3.0–12.0)
Neutro Abs: 5 10*3/uL (ref 1.4–7.7)
Neutrophils Relative %: 57.1 % (ref 43.0–77.0)
Platelets: 346 10*3/uL (ref 150.0–400.0)
RBC: 4.33 Mil/uL (ref 3.87–5.11)
RDW: 13.5 % (ref 11.5–15.5)
WBC: 8.8 10*3/uL (ref 4.0–10.5)

## 2015-03-31 MED ORDER — AMOXICILLIN-POT CLAVULANATE 875-125 MG PO TABS: 1.0000 | ORAL_TABLET | Freq: Two times a day (BID) | ORAL | Status: DC

## 2015-03-31 MED ORDER — GUAIFENESIN-CODEINE 100-10 MG/5ML PO SYRP: ORAL_SOLUTION | ORAL | Status: DC

## 2015-03-31 MED ORDER — PREDNISONE 20 MG PO TABS: ORAL_TABLET | ORAL | Status: DC

## 2015-03-31 MED ORDER — METHYLPREDNISOLONE ACETATE 40 MG/ML IJ SUSP
40.0000 mg | Freq: Once | INTRAMUSCULAR | Status: AC
  Administered 2015-03-31: 40 mg via INTRAMUSCULAR

## 2015-03-31 NOTE — Patient Instructions (Addendum)
For sinus infection and bronchitis will rx  augmentin antibiotic.  For cough Robitussin AC  For wheezing continue inhalers albuterol and symbicort.  Depomedrol 40 mg im. 3 days taper prednisone.  cxr today and cbc.  For rt ankle rest, ice, elevate, rx for crutches. We need to check ankle in 7 days. If no fx and not improving consider sports med referral. If fx refer to orthopedist.  Ace bandage given today.  Follow up in 7 days as well for bronchitis. If worsens over holiday weekend then ED evaluation.  Prior use codeine and pt assures not reaction. She is not going to use hydrocodone while using hydrocodone.

## 2015-03-31 NOTE — Progress Notes (Signed)
Subjective:    Patient ID: [name], female    DOB: 01-28-1976, 39 y.o.   MRN: [id number]  HPI   Pt in with chest congestion, purulent cough, and sinus pressure. Pt states she has history intermittent symptoms like this for 6 months. Pt states some wheezing intermittently as well. In past some response to antibiotics. Last visit required prednisone and IM injection depo-medrol. Also got azithromycin antibiotic. She had some relief  for about 7-10 days then got sick again.   Recent wheezing and using albuterol 3 times daily. Also using symbicort twice a day.   Pt smokes about 1/2 pack a day.  LMP- history of hysterectomy in 2006.  At the very end she also mentioned twisted her rt ankle yesterday. Inversion injury. Hurts presently to ambulat.      Review of Systems  Constitutional: Positive for fever, chills and diaphoresis. Negative for fatigue.  HENT: Positive for congestion.   Respiratory: Positive for cough and wheezing.        Purulent cough.  Cardiovascular: Negative for chest pain and palpitations.  Musculoskeletal: Negative for back pain and gait problem.       Rt ankle pain.  Neurological: Negative for dizziness and headaches.  Hematological: Negative for adenopathy. Does not bruise/bleed easily.  Psychiatric/Behavioral: Negative for behavioral problems and confusion.    Past Medical History  Diagnosis Date  . 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 SLEEP APNEA 08/31/2007  . 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 DEPRESSION 08/31/2007  . ARTHRITIS 08/31/2007  . HYPERTENSION 08/31/2007  . Chronic interstitial cystitis 03/11/2009  . IBS 03/11/2009  . GERD 02/05/2009  . BRONCHITIS, RECURRENT 08/23/2009    Asthmatic Bronchitis-Dr. Melvyn Novas.....-HFA 75% 12/04/2008>75% 02/05/2009>75% 08/04/2009 -PFT's 01/04/2009 2.56 (86%) ratio 75, no resp to B2 and DLC0 67% > 80 after correction   . Anal fissure 03/11/2009  . RECTAL BLEEDING 03/11/2009  .  COLONIC POLYPS, HX OF 07/25/2006    ADENOMATOUS POLYP  . Migraine headache   . Chronic pain   . Chronic nausea   . COPD (chronic obstructive pulmonary disease) ([place])   . Asthma   . Anxiety and depression   . Uterine cyst   . Endometriosis   . Bowel obstruction ([place])   . Colon polyps   . Internal hemorrhoids   . Cancer (HCC)     cervical cancer  . FIBROMYALGIA 08/31/2007  . Arthritis   . Anemia   . Anxiety   . Hyperlipidemia   . Seizures ([place])     been about 1 year since last seisure per pt  . Bipolar 1 disorder ([place])   . PONV (postoperative nausea and vomiting)     Social History   Social History  . Marital Status: Married    Spouse Name: N/A  . Number of Children: 2  . Years of Education: N/A   Occupational History  .     Social History Main Topics  . Smoking status: Current Every Day Smoker -- 0.50 packs/day for 14 years    Types: Cigarettes  . Smokeless tobacco: Never Used     Comment: trying to quit  . Alcohol Use: 0.0 oz/week    0 Standard drinks or equivalent per week     Comment: rare wine   . Drug Use: Yes    Special: Marijuana     Comment: occasional, pt states today 06/19/12 no drug use  . Sexual Activity: Not on file   Other Topics Concern  .  Not on file   Social History Narrative   Homemaker   Daily Caffeine Use-Mtn. Dew          Past Surgical History  Procedure Laterality Date  . Cholecystectomy    . Abdominal hysterectomy    . Pacemaker insertion      in hip for interstitial cystitis  . Bladder surgery      stimulator placed and stretching   . Interstitial cystitis    . Bladder stretching x6    . Replaced bladder pacemaker    . Removal of uterine cyst and scrapped uterus    . Colonoscopy    . Colonoscopy with propofol N/A 09/03/2014    Procedure: COLONOSCOPY WITH PROPOFOL;  Surgeon: Milus Banister, MD;  Location: [facility];  Service: Endoscopy;  Laterality: N/A;    Family History  Problem Relation Age of Onset  . Heart  disease Father   . Asthma Maternal Grandmother   . Emphysema Maternal Grandfather   . Cancer Maternal Grandfather     Lung Cancer  . Cancer Other     Lung Cancer-Aunt  . Colon cancer Neg Hx   . Esophageal cancer Neg Hx   . Rectal cancer Neg Hx   . Stomach cancer Neg Hx   . Thyroid disease Neg Hx     Allergies  Allergen Reactions  . Abilify [Aripiprazole] Swelling, Other (See Comments) and Palpitations    tremors Throat swelling, tremors  . Metoclopramide Hcl Other (See Comments)    Causes seizures  . Propoxyphene Rash  . Tramadol Swelling, Other (See Comments) and Rash    Throat swelling, tremors  . Ambien [Zolpidem Tartrate] Other (See Comments)    hallucinations  . Eszopiclone Other (See Comments)    Hallucinations, hyper, bad taste in mouth   . Amitriptyline     Other reaction(s): Angioedema (ALLERGY/intolerance)  . Metoclopramide     Other reaction(s): Other (See Comments) Seizures  . Other Swelling    Acetaminophen #3 Acetaminophen #3  . Varenicline Other (See Comments)    Suicidal thoughts  . Buprenorphine Hcl Itching and Hives  . Demerol [Meperidine] Rash  . Emetrol Itching, Rash and Hives    Other reaction(s): Rash (ALLERGY/intolerance)  . Morphine And Related Hives and Itching  . Propoxyphene N-Acetaminophen Hives, Itching and Rash    Other reaction(s): Rash (ALLERGY/intolerance)    Current Outpatient Prescriptions on File Prior to Visit  Medication Sig Dispense Refill  . cetirizine (ZYRTEC) 10 MG tablet Take 10 mg by mouth at bedtime as needed for allergies. Reported on 03/31/2015    . clonazePAM (KLONOPIN) 1 MG tablet TAKE ONE TABLET BY MOUTH TWICE DAILY AS NEEDED FOR ANXIETY 30 tablet 0  . Cyanocobalamin (VITAMIN B 12 PO) Take 1 tablet by mouth every evening.    Marland Kitchen doxazosin (CARDURA) 2 MG tablet TAKE ONE TABLET BY MOUTH AT BEDTIME 30 tablet 5  . guaiFENesin-codeine (ROBITUSSIN AC) 100-10 MG/5ML syrup 1-2 tsp po qhs prn 120 mL 0  .  HYDROcodone-acetaminophen (NORCO) 5-325 MG tablet Take 1-2 tablets by mouth every 6 (six) hours as needed for moderate pain. 30 tablet 0  . hyoscyamine (LEVSIN, ANASPAZ) 0.125 MG tablet Take 0.125 mg by mouth every 4 (four) hours as needed for cramping.    . methimazole (TAPAZOLE) 5 MG tablet Take 1 tablet (5 mg total) by mouth 3 (three) times a week. 13 tablet 2  . omeprazole (PRILOSEC) 40 MG capsule Take 1 capsule (40 mg total) by mouth daily. 90 capsule  3  . prochlorperazine (COMPAZINE) 10 MG tablet Take 10 mg by mouth every 6 (six) hours as needed for nausea or vomiting.     Marland Kitchen QUEtiapine (SEROQUEL) 300 MG tablet TAKE ONE TABLET BY MOUTH TWICE DAILY 60 tablet 1  . ranitidine (ZANTAC) 150 MG tablet Take 1 tablet (150 mg total) by mouth 2 (two) times daily. 60 tablet 2  . sertraline (ZOLOFT) 100 MG tablet Take 1 tablet (100 mg total) by mouth daily. 90 tablet 3  . SYMBICORT 160-4.5 MCG/ACT inhaler INHALE TWO PUFFS BY MOUTH TWICE DAILY 1 Inhaler 5  . Tetrahydrozoline HCl (VISINE OP) Apply 2 drops to eye 2 (two) times daily as needed (dry eyes).    . traZODone (DESYREL) 50 MG tablet TAKE TWO TABLETS BY MOUTH AT BEDTIME 60 tablet 0  . VENTOLIN HFA 108 (90 BASE) MCG/ACT inhaler INHALE TWO PUFFS BY MOUTH EVERY 4 HOURS AS NEEDED FOR WHEEZING OR SHORTNESS OF BREATH 18 each 5  . [DISCONTINUED] amitriptyline (ELAVIL) 25 MG tablet 2 tablets by mouth at bedtime     . [DISCONTINUED] clidinium-chlordiazePOXIDE (LIBRAX) 2.5-5 MG per capsule 2 capsules by mouth every morning and 1 at bedtime     . [DISCONTINUED] escitalopram (LEXAPRO) 10 MG tablet Take 10 mg by mouth daily.       No current facility-administered medications on file prior to visit.    BP 100/64 mmHg  Pulse 92  Temp(Src) 97.8 F (36.6 C) (Oral)  Ht 5' 3.5" (1.613 m)  Wt 107 lb 12.8 oz (48.898 kg)  BMI 18.79 kg/m2  SpO2 97%       Objective:   Physical Exam   General  Mental Status - Alert. General Appearance - Well groomed. Not  in acute distress.  Skin Rashes- No Rashes.  HEENT Head- Normal. Ear Auditory Canal - Left- Normal. Right - Normal.Tympanic Membrane- Left- Normal. Right- Normal. Eye Sclera/Conjunctiva- Left- Normal. Right- Normal. Nose & Sinuses Nasal Mucosa- Left-  Not boggy or Congested. Right-  Not  boggy or Congested. Mouth & Throat Lips: Upper Lip- Normal: no dryness, cracking, pallor, cyanosis, or vesicular eruption. Lower Lip-Normal: no dryness, cracking, pallor, cyanosis or vesicular eruption. Buccal Mucosa- Bilateral- No Aphthous ulcers. Oropharynx- No Discharge or Erythema. Tonsils: Characteristics- Bilateral- No Erythema or Congestion. Size/Enlargement- Bilateral- No enlargement. Discharge- bilateral-None.  Neck Neck- Supple. No Masses.   Chest and Lung Exam Auscultation: Breath Sounds:- even and unlabored, but bilateral upper lobe rhonchi.  Cardiovascular Auscultation:Rythm- Regular, rate and rhythm. Murmurs & Other Heart Sounds:Ausculatation of the heart reveal- No Murmurs.  Lymphatic Head & Neck General Head & Neck Lymphatics: Bilateral: Description- No Localized lymphadenopathy.  Rt ankle- no bruising, faint swelling. Rt lateral malleolus mild tender. Rt foot- no tenderness.      Assessment & Plan:  For sinus infection and bronchitis will rx  augmentin antibiotic.  For cough Robitussin AC  For wheezing continue inhalers albuterol and symbicort.  Depomedrol 40 mg im. 3 days taper prednisone.  cxr today and cbc.  For rt ankle rest, ice, elevate, rx for crutches. We need to check ankle in 7 days. If no fx and not improving consider sports med referral. If fx refer to orthopedist.  Ace bandage given today.  Follow up in 7 days as well for bronchitis. If worsens over holiday weekend then ED evaluation.

## 2015-03-31 NOTE — Progress Notes (Signed)
Pre visit review using our clinic review tool, if applicable. No additional management support is needed unless otherwise documented below in the visit note. 

## 2015-04-09 ENCOUNTER — Other Ambulatory Visit: Payer: Self-pay | Admitting: Physician Assistant

## 2015-04-09 NOTE — Telephone Encounter (Signed)
Requesting Trazodone 50mg -Take 2 tablets by mouth at bedtime. Last refill:03/10/15;#60,0 Last OV:03/31/15 with [facility]-Pt has never seen [facility]. UDS:01/28/15-High risk-Next screen-02/28/15 Please advise.//AB/CMA

## 2015-04-26 ENCOUNTER — Other Ambulatory Visit: Payer: Self-pay | Admitting: Family Medicine

## 2015-04-27 NOTE — Telephone Encounter (Signed)
Last seen 02/23/15 and filled 03/22/15 #30  Please advise     KP

## 2015-04-28 ENCOUNTER — Telehealth: Payer: Self-pay | Admitting: *Deleted

## 2015-04-28 NOTE — Telephone Encounter (Signed)
Received Physician Order for cane or crutches; forward to provider/SLS 01/18

## 2015-05-04 NOTE — Telephone Encounter (Signed)
Received completed form from; envelope sent to mail/SLS 01/24

## 2015-05-05 ENCOUNTER — Telehealth: Payer: Self-pay | Admitting: Family Medicine

## 2015-05-05 ENCOUNTER — Encounter (HOSPITAL_BASED_OUTPATIENT_CLINIC_OR_DEPARTMENT_OTHER): Payer: Self-pay

## 2015-05-05 ENCOUNTER — Emergency Department (HOSPITAL_BASED_OUTPATIENT_CLINIC_OR_DEPARTMENT_OTHER): Payer: BLUE CROSS/BLUE SHIELD

## 2015-05-05 ENCOUNTER — Emergency Department (HOSPITAL_BASED_OUTPATIENT_CLINIC_OR_DEPARTMENT_OTHER)
Admission: EM | Admit: 2015-05-05 | Discharge: 2015-05-05 | Disposition: A | Discharge status: Home / Self Care | Payer: BLUE CROSS/BLUE SHIELD | Attending: Emergency Medicine | Admitting: Emergency Medicine

## 2015-05-05 DIAGNOSIS — R112 Nausea with vomiting, unspecified: Secondary | ICD-10-CM | POA: Diagnosis not present

## 2015-05-05 DIAGNOSIS — M797 Fibromyalgia: Secondary | ICD-10-CM | POA: Insufficient documentation

## 2015-05-05 DIAGNOSIS — Z79899 Other long term (current) drug therapy: Secondary | ICD-10-CM | POA: Diagnosis not present

## 2015-05-05 DIAGNOSIS — Y9289 Other specified places as the place of occurrence of the external cause: Secondary | ICD-10-CM | POA: Insufficient documentation

## 2015-05-05 DIAGNOSIS — Z792 Long term (current) use of antibiotics: Secondary | ICD-10-CM | POA: Insufficient documentation

## 2015-05-05 DIAGNOSIS — M1990 Unspecified osteoarthritis, unspecified site: Secondary | ICD-10-CM | POA: Diagnosis not present

## 2015-05-05 DIAGNOSIS — J449 Chronic obstructive pulmonary disease, unspecified: Secondary | ICD-10-CM | POA: Diagnosis not present

## 2015-05-05 DIAGNOSIS — F419 Anxiety disorder, unspecified: Secondary | ICD-10-CM | POA: Diagnosis not present

## 2015-05-05 DIAGNOSIS — R109 Unspecified abdominal pain: Secondary | ICD-10-CM | POA: Insufficient documentation

## 2015-05-05 DIAGNOSIS — I10 Essential (primary) hypertension: Secondary | ICD-10-CM | POA: Insufficient documentation

## 2015-05-05 DIAGNOSIS — Z8742 Personal history of other diseases of the female genital tract: Secondary | ICD-10-CM | POA: Insufficient documentation

## 2015-05-05 DIAGNOSIS — K219 Gastro-esophageal reflux disease without esophagitis: Secondary | ICD-10-CM | POA: Diagnosis not present

## 2015-05-05 DIAGNOSIS — F17210 Nicotine dependence, cigarettes, uncomplicated: Secondary | ICD-10-CM | POA: Diagnosis not present

## 2015-05-05 DIAGNOSIS — G8929 Other chronic pain: Secondary | ICD-10-CM | POA: Diagnosis not present

## 2015-05-05 DIAGNOSIS — S30810A Abrasion of lower back and pelvis, initial encounter: Secondary | ICD-10-CM | POA: Diagnosis not present

## 2015-05-05 DIAGNOSIS — Z87442 Personal history of urinary calculi: Secondary | ICD-10-CM | POA: Diagnosis not present

## 2015-05-05 DIAGNOSIS — W19XXXA Unspecified fall, initial encounter: Secondary | ICD-10-CM

## 2015-05-05 DIAGNOSIS — D649 Anemia, unspecified: Secondary | ICD-10-CM | POA: Diagnosis not present

## 2015-05-05 DIAGNOSIS — Y998 Other external cause status: Secondary | ICD-10-CM | POA: Diagnosis not present

## 2015-05-05 DIAGNOSIS — E079 Disorder of thyroid, unspecified: Secondary | ICD-10-CM | POA: Insufficient documentation

## 2015-05-05 DIAGNOSIS — S3992XA Unspecified injury of lower back, initial encounter: Secondary | ICD-10-CM | POA: Diagnosis present

## 2015-05-05 DIAGNOSIS — Z23 Encounter for immunization: Secondary | ICD-10-CM | POA: Insufficient documentation

## 2015-05-05 DIAGNOSIS — W108XXA Fall (on) (from) other stairs and steps, initial encounter: Secondary | ICD-10-CM | POA: Insufficient documentation

## 2015-05-05 DIAGNOSIS — Y9389 Activity, other specified: Secondary | ICD-10-CM | POA: Diagnosis not present

## 2015-05-05 DIAGNOSIS — R630 Anorexia: Secondary | ICD-10-CM | POA: Insufficient documentation

## 2015-05-05 DIAGNOSIS — G43909 Migraine, unspecified, not intractable, without status migrainosus: Secondary | ICD-10-CM | POA: Insufficient documentation

## 2015-05-05 DIAGNOSIS — F319 Bipolar disorder, unspecified: Secondary | ICD-10-CM | POA: Diagnosis not present

## 2015-05-05 DIAGNOSIS — Z86010 Personal history of colonic polyps: Secondary | ICD-10-CM | POA: Insufficient documentation

## 2015-05-05 DIAGNOSIS — Z87448 Personal history of other diseases of urinary system: Secondary | ICD-10-CM | POA: Insufficient documentation

## 2015-05-05 DIAGNOSIS — S39012A Strain of muscle, fascia and tendon of lower back, initial encounter: Secondary | ICD-10-CM | POA: Insufficient documentation

## 2015-05-05 DIAGNOSIS — Z7951 Long term (current) use of inhaled steroids: Secondary | ICD-10-CM | POA: Insufficient documentation

## 2015-05-05 DIAGNOSIS — K589 Irritable bowel syndrome without diarrhea: Secondary | ICD-10-CM | POA: Diagnosis not present

## 2015-05-05 DIAGNOSIS — T07XXXA Unspecified multiple injuries, initial encounter: Secondary | ICD-10-CM

## 2015-05-05 HISTORY — DX: Disorder of thyroid, unspecified: E07.9

## 2015-05-05 LAB — CBC
HCT: 40.3 % (ref 36.0–46.0)
Hemoglobin: 13.4 g/dL (ref 12.0–15.0)
MCH: 30.6 pg (ref 26.0–34.0)
MCHC: 33.3 g/dL (ref 30.0–36.0)
MCV: 92 fL (ref 78.0–100.0)
Platelets: 326 10*3/uL (ref 150–400)
RBC: 4.38 MIL/uL (ref 3.87–5.11)
RDW: 12.3 % (ref 11.5–15.5)
WBC: 7.4 10*3/uL (ref 4.0–10.5)

## 2015-05-05 LAB — COMPREHENSIVE METABOLIC PANEL
ALT: 11 U/L — ABNORMAL LOW (ref 14–54)
AST: 16 U/L (ref 15–41)
Albumin: 3.9 g/dL (ref 3.5–5.0)
Alkaline Phosphatase: 88 U/L (ref 38–126)
Anion gap: 7 (ref 5–15)
BUN: 9 mg/dL (ref 6–20)
CO2: 24 mmol/L (ref 22–32)
Calcium: 9.3 mg/dL (ref 8.9–10.3)
Chloride: 107 mmol/L (ref 101–111)
Creatinine, Ser: 0.78 mg/dL (ref 0.44–1.00)
GFR calc Af Amer: >60 mL/min (ref >60)
GFR calc non Af Amer: >60 mL/min (ref >60)
Glucose, Bld: 110 mg/dL — ABNORMAL HIGH (ref 65–99)
Potassium: 3.7 mmol/L (ref 3.5–5.1)
Sodium: 138 mmol/L (ref 135–145)
Total Bilirubin: 0.1 mg/dL — ABNORMAL LOW (ref 0.3–1.2)
Total Protein: 7.4 g/dL (ref 6.5–8.1)

## 2015-05-05 LAB — LIPASE, BLOOD: Lipase: 40 U/L (ref 11–51)

## 2015-05-05 MED ORDER — PROMETHAZINE HCL 25 MG RE SUPP: 25.0000 mg | Freq: Four times a day (QID) | RECTAL | Status: DC | PRN

## 2015-05-05 MED ORDER — HYDROCODONE-ACETAMINOPHEN 5-325 MG PO TABS
1.0000 | ORAL_TABLET | Freq: Once | ORAL | Status: AC
  Administered 2015-05-05: 1 via ORAL
  Filled 2015-05-05: qty 1

## 2015-05-05 MED ORDER — ONDANSETRON 8 MG PO TBDP
8.0000 mg | ORAL_TABLET | Freq: Once | ORAL | Status: AC
Start: 2015-05-05 — End: 2015-05-05
  Administered 2015-05-05: 8 mg via ORAL
  Filled 2015-05-05: qty 1

## 2015-05-05 MED ORDER — CYCLOBENZAPRINE HCL 10 MG PO TABS: 10.0000 mg | ORAL_TABLET | Freq: Two times a day (BID) | ORAL | Status: DC | PRN

## 2015-05-05 MED ORDER — TETANUS-DIPHTH-ACELL PERTUSSIS 5-2.5-18.5 LF-MCG/0.5 IM SUSP
0.5000 mL | Freq: Once | INTRAMUSCULAR | Status: AC
  Administered 2015-05-05: 0.5 mL via INTRAMUSCULAR
  Filled 2015-05-05: qty 0.5

## 2015-05-05 MED ORDER — LIDOCAINE 5 % EX PTCH: 1.0000 | MEDICATED_PATCH | CUTANEOUS | Status: DC

## 2015-05-05 MED FILL — HYDROCODON-APAP 10-325: 10-325 | 30 days supply | Qty: 120 | Fill #0

## 2015-05-05 NOTE — ED Notes (Addendum)
Pt reports she is "unable to eat, keep food down", reports "vomiting lots of bile, generalized weakness, fall two days ago, lower back pain, hip popped in and out of socket, slipped down steps this morning, made back pain worse, its a 50. My stomach always hurts. I can barely pee, I'm constipated. For a couple of months." Pt with newly diagnosed throid disease but cannot recall name of medication. Pt has left hip pacemaker for interstitial cystitis. Pt reports she is out of her prescribed hydrocodone for her IC from urology.

## 2015-05-05 NOTE — Telephone Encounter (Signed)
Pt called in stating that "it feels like my brain is telling me not to eat because I dont have an appetite". Pt request an appt with PCP, informed pt that PCP is out of the office today but looked on other providers schedule for pt to be seen in office today. Pt declined coming in today because she says that  her and her daughter shares one vehicle and she is unsure of the time that she will be back home. Forwarded pt to [facility] due to pt not being able to come in today and her concern. Also, tried scheduling pt for tomorrow but pt says that she is unsure of her daughters schedule.   Informed pt that she definitely need to come in to be seen so if she would please follow up with our office today to schedule an appt.

## 2015-05-05 NOTE — ED Provider Notes (Signed)
CSN: [id number]     Arrival date & time 05/05/15  1336 History   First MD Initiated Contact with Patient 05/05/15 1348     Chief Complaint  Patient presents with  . Emesis     (Consider location/radiation/quality/duration/timing/severity/associated sxs/prior Treatment) HPI   Blood pressure 99/73, pulse 79, temperature 98.2 F (36.8 C), temperature source Oral, resp. rate 15, height 5\' 3"  (1.6 m), weight 46.267 kg, SpO2 96 %.  [name] is a 40 y.o. female complaining of with past medical history significant for tobacco use, interstitial cystitis, IBS, COPD stating that she has had a decreased appetite for 3 months than that she's been nauseous with episodes of vomiting over the last several weeks, she feels generally weak, she had a fall one week ago, this exacerbated her chronic low back pain for which she has an implanted stimulator placed. Patient reports that she had another fall 2 days ago down 5 steps, she landed directly on the low back, she rates her pain at 10 out of 10, states that it's 10 out of 10 on the back and 10 out of 10 on the abdomen. She denies fever, chills, change in urination, defecation, incontinence, dysuria, abnormal vaginal discharge, fever, chills. Patient states that she could not get into see her primary care physician this morning because they didn't have an appointment. Patient states that she was taking Vicodin at home for relief but she ran out 2 days ago and states she is not able to get a refill for another 2 days.  Past Medical History  Diagnosis Date  . 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 SLEEP APNEA 08/31/2007  . 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 DEPRESSION 08/31/2007  . ARTHRITIS 08/31/2007  . HYPERTENSION 08/31/2007  . Chronic interstitial cystitis 03/11/2009  . IBS 03/11/2009  . GERD 02/05/2009  . BRONCHITIS, RECURRENT 08/23/2009    Asthmatic Bronchitis-Dr. Melvyn Novas.....-HFA 75% 12/04/2008>75% 02/05/2009>75% 08/04/2009  -PFT's 01/04/2009 2.56 (86%) ratio 75, no resp to B2 and DLC0 67% > 80 after correction   . Anal fissure 03/11/2009  . RECTAL BLEEDING 03/11/2009  . COLONIC POLYPS, HX OF 07/25/2006    ADENOMATOUS POLYP  . Migraine headache   . Chronic pain   . Chronic nausea   . COPD (chronic obstructive pulmonary disease) ([place])   . Asthma   . Anxiety and depression   . Uterine cyst   . Endometriosis   . Bowel obstruction ([place])   . Colon polyps   . Internal hemorrhoids   . Cancer (HCC)     cervical cancer  . FIBROMYALGIA 08/31/2007  . Arthritis   . Anemia   . Anxiety   . Hyperlipidemia   . Seizures ([place])     been about 1 year since last seisure per pt  . Bipolar 1 disorder ([place])   . PONV (postoperative nausea and vomiting)   . Thyroid disease    Past Surgical History  Procedure Laterality Date  . Cholecystectomy    . Abdominal hysterectomy    . Pacemaker insertion      in hip for interstitial cystitis  . Bladder surgery      stimulator placed and stretching   . Interstitial cystitis    . Bladder stretching x6    . Replaced bladder pacemaker    . Removal of uterine cyst and scrapped uterus    . Colonoscopy    . Colonoscopy with propofol N/A 09/03/2014    Procedure: COLONOSCOPY WITH PROPOFOL;  Surgeon: Quillian Quince  Merrily Brittle, MD;  Location: [facility];  Service: Endoscopy;  Laterality: N/A;   Family History  Problem Relation Age of Onset  . Heart disease Father   . Asthma Maternal Grandmother   . Emphysema Maternal Grandfather   . Cancer Maternal Grandfather     Lung Cancer  . Cancer Other     Lung Cancer-Aunt  . Colon cancer Neg Hx   . Esophageal cancer Neg Hx   . Rectal cancer Neg Hx   . Stomach cancer Neg Hx   . Thyroid disease Neg Hx    Social History  Substance Use Topics  . Smoking status: Current Every Day Smoker -- 0.50 packs/day for 14 years    Types: Cigarettes  . Smokeless tobacco: Never Used     Comment: trying to quit  . Alcohol Use: 0.0 oz/week    0 Standard  drinks or equivalent per week     Comment: rare wine    OB History    No data available     Review of Systems  10 systems reviewed and found to be negative, except as noted in the HPI.  Allergies  Abilify; Metoclopramide hcl; Propoxyphene; Tramadol; Ambien; Eszopiclone; Amitriptyline; Metoclopramide; Other; Varenicline; Buprenorphine hcl; Demerol; Emetrol; Morphine and related; and Propoxyphene n-acetaminophen  Home Medications   Prior to Admission medications   Medication Sig Start Date End Date Taking? Authorizing Provider  cetirizine (ZYRTEC) 10 MG tablet Take 10 mg by mouth at bedtime as needed for allergies. Reported on 03/31/2015   Yes Historical Provider, MD  clonazePAM (KLONOPIN) 1 MG tablet TAKE ONE TABLET BY MOUTH TWICE DAILY AS NEEDED FOR ANXIETY 04/27/15  Yes Yvonne R Lowne, DO  Cyanocobalamin (VITAMIN B 12 PO) Take 1 tablet by mouth every evening.   Yes Historical Provider, MD  doxazosin (CARDURA) 2 MG tablet TAKE ONE TABLET BY MOUTH AT BEDTIME 02/22/15  Yes Rosalita Chessman, DO  guaiFENesin-codeine [facility]) 100-10 MG/5ML syrup 1-2 tsp po qhs prn 03/31/15  Yes Edward Saguier, PA-C  HYDROcodone-acetaminophen (NORCO) 5-325 MG tablet Take 1-2 tablets by mouth every 6 (six) hours as needed for moderate pain. 01/28/15  Yes Yvonne R Lowne, DO  hyoscyamine (LEVSIN, ANASPAZ) 0.125 MG tablet Take 0.125 mg by mouth every 4 (four) hours as needed for cramping.   Yes Historical Provider, MD  methimazole (TAPAZOLE) 5 MG tablet Take 1 tablet (5 mg total) by mouth 3 (three) times a week. 02/16/15  Yes Renato Shin, MD  omeprazole (PRILOSEC) 40 MG capsule Take 1 capsule (40 mg total) by mouth daily. 08/10/14  Yes Alferd Apa Lowne, DO  predniSONE (DELTASONE) 20 MG tablet 1 tab po tid day 1, 1 tab po bid day 2, and 1 tab po day 3 03/31/15  Yes Edward Saguier, PA-C  prochlorperazine (COMPAZINE) 10 MG tablet Take 10 mg by mouth every 6 (six) hours as needed for nausea or vomiting.  07/13/14  Yes  Historical Provider, MD  QUEtiapine (SEROQUEL) 300 MG tablet TAKE ONE TABLET BY MOUTH TWICE DAILY 03/08/15  Yes Yvonne R Lowne, DO  ranitidine (ZANTAC) 150 MG tablet Take 1 tablet (150 mg total) by mouth 2 (two) times daily. 01/29/15  Yes Yvonne R Lowne, DO  sertraline (ZOLOFT) 100 MG tablet Take 1 tablet (100 mg total) by mouth daily. 08/10/14  Yes Rosalita Chessman, DO  SYMBICORT 160-4.5 MCG/ACT inhaler INHALE TWO PUFFS BY MOUTH TWICE DAILY 02/01/15  Yes Rosalita Chessman, DO  Tetrahydrozoline HCl (VISINE OP) Apply 2 drops to  eye 2 (two) times daily as needed (dry eyes).   Yes Historical Provider, MD  traZODone (DESYREL) 50 MG tablet TAKE TWO TABLETS BY MOUTH AT BEDTIME 04/09/15  Yes Yvonne R Lowne, DO  VENTOLIN HFA 108 (90 BASE) MCG/ACT inhaler INHALE TWO PUFFS BY MOUTH EVERY 4 HOURS AS NEEDED FOR WHEEZING OR SHORTNESS OF BREATH 02/01/15  Yes Yvonne R Lowne, DO  amoxicillin-clavulanate (AUGMENTIN) 875-125 MG tablet Take 1 tablet by mouth 2 (two) times daily. 03/31/15   Percell Miller Saguier, PA-C  cyclobenzaprine (FLEXERIL) 10 MG tablet Take 1 tablet (10 mg total) by mouth 2 (two) times daily as needed for muscle spasms. 05/05/15   Donyetta Ogletree, PA-C  lidocaine (LIDODERM) 5 % Place 1 patch onto the skin daily. Remove & Discard patch within 12 hours or as directed by MD 05/05/15   Monico Blitz, PA-C  promethazine (PHENERGAN) 25 MG suppository Place 1 suppository (25 mg total) rectally every 6 (six) hours as needed for nausea or vomiting. 05/05/15   Elmyra Ricks Meshawn Oconnor, PA-C   BP 99/73 mmHg  Pulse 79  Temp(Src) 98.2 F (36.8 C) (Oral)  Resp 15  Ht 5\' 3"  (1.6 m)  Wt 46.267 kg  BMI 18.07 kg/m2  SpO2 96% Physical Exam  Constitutional: She is oriented to person, place, and time. She appears well-developed and well-nourished. No distress.  HENT:  Head: Normocephalic and atraumatic.  Mouth/Throat: Oropharynx is clear and moist.  Eyes: Conjunctivae and EOM are normal. Pupils are equal, round, and reactive to  light.  Neck: Normal range of motion.  Cardiovascular: Normal rate, regular rhythm and intact distal pulses.   Pulmonary/Chest: Effort normal and breath sounds normal. No stridor. No respiratory distress. She has no wheezes. She has no rales. She exhibits no tenderness.  Abdominal: There is tenderness.  Normal active bowel sounds, patient is exquisitely tender to light touch on all aspects of the abdomen  Musculoskeletal: Normal range of motion.  Partial-thickness abrasions in a linear fashion as diagrammed to the low back. No point tenderness to percussion of lumbar spinal processes. Diffusely exquisitely tender to palpation along the bilateral lumbar paraspinal musculature.  Strength is 5 out of 5 to bilateral lower extremities at hip and knee; extensor hallucis longus 5 out of 5. Ankle strength 5 out of 5, no clonus, neurovascularly intact. No saddle anaesthesia. Patellar reflexes are 2+ bilaterally.   Ambulates with a coordinated in nonantalgic gait   Neurological: She is alert and oriented to person, place, and time.  Skin:     Psychiatric: She has a normal mood and affect.  Nursing note and vitals reviewed.   ED Course  Procedures (including critical care time) Labs Review Labs Reviewed  COMPREHENSIVE METABOLIC PANEL - Abnormal; Notable for the following:    Glucose, Bld 110 (*)    ALT 11 (*)    Total Bilirubin 0.1 (*)    All other components within normal limits  CBC  LIPASE, BLOOD    Imaging Review Dg Lumbar Spine Complete  05/05/2015  CLINICAL DATA:  Chronic lumbago. Fall earlier today. Radicular symptoms in both lower extremities EXAM: LUMBAR SPINE - COMPLETE 4+ VIEW COMPARISON:  January 28, 2015 FINDINGS: Frontal, lateral, spot lumbosacral lateral, and bilateral oblique views were obtained. There are 5 non-rib-bearing lumbar type vertebral bodies. There is no fracture or spondylolisthesis. Disc spaces appear normal. There is a stimulator on the left overlying the left  upper sacrum with the tip of the stimulator in the posterior mid pelvic regions slightly to the left  of midline, unchanged. There is mild facet osteoarthritic change at L5-S1 bilaterally. Surgical clips noted in right upper quadrant region. IMPRESSION: Stimulator in left pelvic region, stable. No fracture or spondylolisthesis. Stable mild facet osteoarthritic change at L5-S1. Electronically Signed   By: Lowella Grip III M.D.   On: 05/05/2015 14:32   I have personally reviewed and evaluated these images and lab results as part of my medical decision-making.   EKG Interpretation None      MDM   Final diagnoses:  Fall, initial encounter  Abrasions of multiple sites  Lumbar strain, initial encounter  Chronic abdominal pain    Filed Vitals:   05/05/15 1344  BP: 99/73  Pulse: 79  Temp: 98.2 F (36.8 C)  TempSrc: Oral  Resp: 15  Height: 5\' 3"  (1.6 m)  Weight: 46.267 kg  SpO2: 96%    Medications  ondansetron (ZOFRAN-ODT) disintegrating tablet 8 mg (8 mg Oral Given 05/05/15 1430)  HYDROcodone-acetaminophen (NORCO/VICODIN) 5-325 MG per tablet 1 tablet (1 tablet Oral Given 05/05/15 1430)  Tdap (BOOSTRIX) injection 0.5 mL (0.5 mLs Intramuscular Given 05/05/15 1457)    [name] is 40 y.o. female presenting with  low back pain status post fall several days ago in addition to abdominal pain with emesis. Abdominal exam is nonfocal, she is diffusely exquisite tender to light touch. She is normoactive bowel sounds, no tachypnea or tachycardia, she is afebrile and overall very well appearing. Mucous membranes are moist, she does not appear to be dehydrated. Blood work reassuring with no abnormality except for a mild elevation in glucose to 110. I think this is likely her chronic abdominal pain from IBS. She also is reporting back pain, neuro exam is nonfocal, x-rays are negative. I think there is an element of withdrawal here as patient has been out of her chronic 10 mg Vicodin for several  days. I've explained to her that it is not our policy to refill controlled substances in the ED. Patient is repeatedly asking for more narcotics. I feel I have aggressively treated her pain with Vicodin, she is tolerating oral medications. Patient will be discharged home with a prescription for Flexeril, Lidoderm and Phenergan suppositories. Of encouraged her to follow closely with her primary care physician. Chart review shows that patient was in no show for her endocrinology appointment, have encouraged her to follow closely with them as well.   Evaluation does not show pathology that would require ongoing emergent intervention or inpatient treatment. Pt is hemodynamically stable and mentating appropriately. Discussed findings and plan with patient/guardian, who agrees with care plan. All questions answered. Return precautions discussed and outpatient follow up given.   Discharge Medication List as of 05/05/2015  2:52 PM    START taking these medications   Details  cyclobenzaprine (FLEXERIL) 10 MG tablet Take 1 tablet (10 mg total) by mouth 2 (two) times daily as needed for muscle spasms., Starting 05/05/2015, Until Discontinued, Print    lidocaine (LIDODERM) 5 % Place 1 patch onto the skin daily. Remove & Discard patch within 12 hours or as directed by MD, Starting 05/05/2015, Until Discontinued, Print    promethazine (PHENERGAN) 25 MG suppository Place 1 suppository (25 mg total) rectally every 6 (six) hours as needed for nausea or vomiting., Starting 05/05/2015, Until Discontinued, [employer], PA-C 05/05/15 [place], MD 05/07/15 [phone]

## 2015-05-05 NOTE — Discharge Instructions (Signed)
Please follow with your primary care doctor in the next 2 days for a check-up. They must obtain records for further management.   Do not hesitate to return to the Emergency Department for any new, worsening or concerning symptoms.   For pain control you may take Flexeril. Do not drink alcohol, drive or operate heavy machinery when taking Flexeril.  Wash the affected area with soap and water and apply a thin layer of topical antibiotic ointment. Do this every 12 hours.   Do not use rubbing alcohol or hydrogen peroxide.                        Look for signs of infection: if you see redness, if the area becomes warm, if pain increases sharply, there is discharge (pus), if red streaks appear or you develop fever or vomiting, RETURN immediately to the Emergency Department  for a recheck.    Chronic Pain Chronic pain can be defined as pain that is off and on and lasts for 3-6 months or longer. Many things cause chronic pain, which can make it difficult to make a diagnosis. There are many treatment options available for chronic pain. However, finding a treatment that works well for you may require trying various approaches until the right one is found. Many people benefit from a combination of two or more types of treatment to control their pain. SYMPTOMS  Chronic pain can occur anywhere in the body and can range from mild to very severe. Some types of chronic pain include:  Headache.  Low back pain.  Cancer pain.  Arthritis pain.  Neurogenic pain. This is pain resulting from damage to nerves. People with chronic pain may also have other symptoms such as:  Depression.  Anger.  Insomnia.  Anxiety. DIAGNOSIS  Your health care provider will help diagnose your condition over time. In many cases, the initial focus will be on excluding possible conditions that could be causing the pain. Depending on your symptoms, your health care provider may order tests to diagnose your condition. Some of  these tests may include:   Blood tests.   CT scan.   MRI.   X-rays.   Ultrasounds.   Nerve conduction studies.  You may need to see a specialist.  TREATMENT  Finding treatment that works well may take time. You may be referred to a pain specialist. He or she may prescribe medicine or therapies, such as:   Mindful meditation or yoga.  Shots (injections) of numbing or pain-relieving medicines into the spine or area of pain.  Local electrical stimulation.  Acupuncture.   Massage therapy.   Aroma, color, light, or sound therapy.   Biofeedback.   Working with a physical therapist to keep from getting stiff.   Regular, gentle exercise.   Cognitive or behavioral therapy.   Group support.  Sometimes, surgery may be recommended.  HOME CARE INSTRUCTIONS   Take all medicines as directed by your health care provider.   Lessen stress in your life by relaxing and doing things such as listening to calming music.   Exercise or be active as directed by your health care provider.   Eat a healthy diet and include things such as vegetables, fruits, fish, and lean meats in your diet.   Keep all follow-up appointments with your health care provider.   Attend a support group with others suffering from chronic pain. SEEK MEDICAL CARE IF:   Your pain gets worse.   You develop  a new pain that was not there before.   You cannot tolerate medicines given to you by your health care provider.   You have new symptoms since your last visit with your health care provider.  SEEK IMMEDIATE MEDICAL CARE IF:   You feel weak.   You have decreased sensation or numbness.   You lose control of bowel or bladder function.   Your pain suddenly gets much worse.   You develop shaking.  You develop chills.  You develop confusion.  You develop chest pain.  You develop shortness of breath.  MAKE SURE YOU:  Understand these instructions.  Will watch your  condition.  Will get help right away if you are not doing well or get worse.   This information is not intended to replace advice given to you by your health care provider. Make sure you discuss any questions you have with your health care provider.   Document Released: 12/17/2001 Document Revised: 11/27/2012 Document Reviewed: 09/20/2012 Elsevier Interactive Patient Education [employer].

## 2015-05-05 NOTE — ED Notes (Signed)
Patient transported to X-ray 

## 2015-05-05 NOTE — ED Notes (Signed)
Pt requesting pain medication , states she can not have her viocodin filled till Friday, she is out of vicodin

## 2015-05-05 NOTE — ED Notes (Signed)
Pt given a warm blanket 

## 2015-05-05 NOTE — ED Notes (Signed)
Pa  at bedside. 

## 2015-05-05 NOTE — Telephone Encounter (Signed)
Patient Name: [name]  DOB: [birth year]    Initial Comment Caller states she has had trouble eating for 3+months, weak, body hurts.    Nurse Assessment      Guidelines    Guideline Title Affirmed Question Affirmed Notes       Final Disposition User   FINAL ATTEMPT MADE - message left Raphael Gibney, RN, Vanita Ingles    Comments  Attempted primary number and left message. Attempted secondary number and heard busy signal.

## 2015-05-05 NOTE — Telephone Encounter (Signed)
Called pt back to follow up with her after giving her time to speak with [facility] to schedule an appt. Pt says that since we spoke she walked outside and fell. Pt says that she is in some pain  but has a neighbor there with her. Her concern is that she has a Psychologist, forensic in her hip. I advised pt to call 911. Pt says that she is going to have her neighbor to take her to the hospital. Pt says that she will follow back up with our office after being seen at the hospital.

## 2015-05-06 ENCOUNTER — Ambulatory Visit (INDEPENDENT_AMBULATORY_CARE_PROVIDER_SITE_OTHER): Payer: BLUE CROSS/BLUE SHIELD | Admitting: Family Medicine

## 2015-05-06 ENCOUNTER — Encounter: Payer: Self-pay | Admitting: Family Medicine

## 2015-05-06 VITALS — BP 102/66 | HR 76 | Temp 97.8°F | Wt 106.2 lb

## 2015-05-06 DIAGNOSIS — R1084 Generalized abdominal pain: Secondary | ICD-10-CM | POA: Diagnosis not present

## 2015-05-06 DIAGNOSIS — F419 Anxiety disorder, unspecified: Secondary | ICD-10-CM

## 2015-05-06 DIAGNOSIS — F411 Generalized anxiety disorder: Secondary | ICD-10-CM | POA: Diagnosis not present

## 2015-05-06 DIAGNOSIS — R110 Nausea: Secondary | ICD-10-CM | POA: Diagnosis not present

## 2015-05-06 LAB — POCT URINALYSIS DIPSTICK
Bilirubin, UA: NEGATIVE
Blood, UA: NEGATIVE
Glucose, UA: NEGATIVE
Ketones, UA: NEGATIVE
Leukocytes, UA: NEGATIVE
Nitrite, UA: NEGATIVE
Protein, UA: NEGATIVE
Spec Grav, UA: 1.015
Urobilinogen, UA: 0.2
pH, UA: 6

## 2015-05-06 LAB — POCT URINE PREGNANCY: Preg Test, Ur: NEGATIVE

## 2015-05-06 MED ORDER — DIAZEPAM 5 MG PO TABS: 5.0000 mg | ORAL_TABLET | Freq: Two times a day (BID) | ORAL | Status: DC | PRN

## 2015-05-06 MED ORDER — DEXLANSOPRAZOLE 60 MG PO CPDR: 60.0000 mg | DELAYED_RELEASE_CAPSULE | Freq: Every day | ORAL | Status: DC

## 2015-05-06 MED FILL — diazePAM 5 MG TABS: 5 | 30 days supply | Qty: 60 | Fill #0

## 2015-05-06 NOTE — Telephone Encounter (Signed)
Seen in the ED 05/05/15.     KP

## 2015-05-06 NOTE — Telephone Encounter (Signed)
Pt went to the ER on 05/05/15.  Has an appt with Dr. Etter Sjogren today (05/06/15) at 4 pm.

## 2015-05-06 NOTE — Progress Notes (Signed)
Patient ID: [name], female    DOB: 11/19/1975  Age: 40 y.o. MRN: [id number]    Subjective:  Subjective HPI [name] presents for f/u fall yesterday and she c/o back pain.  She is also c/o 3 month hx of abd pain and being unable to eat.  No fever.  Er visit reviewed.  Pt given muscle relaxer and lidoderm patch.     Review of Systems  Constitutional: Negative for diaphoresis, appetite change, fatigue and unexpected weight change.  Eyes: Negative for pain, redness and visual disturbance.  Respiratory: Negative for cough, chest tightness, shortness of breath and wheezing.   Cardiovascular: Negative for chest pain, palpitations and leg swelling.  Gastrointestinal: Positive for nausea and abdominal pain.  Endocrine: Negative for cold intolerance, heat intolerance, polydipsia, polyphagia and polyuria.  Genitourinary: Negative for dysuria, frequency and difficulty urinating.  Musculoskeletal: Positive for myalgias and back pain. Negative for gait problem.  Neurological: Negative for dizziness, light-headedness, numbness and headaches.    History Past Medical History  Diagnosis Date  . 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 SLEEP APNEA 08/31/2007  . 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 DEPRESSION 08/31/2007  . ARTHRITIS 08/31/2007  . HYPERTENSION 08/31/2007  . Chronic interstitial cystitis 03/11/2009  . IBS 03/11/2009  . GERD 02/05/2009  . BRONCHITIS, RECURRENT 08/23/2009    Asthmatic Bronchitis-Dr. Melvyn Novas.....-HFA 75% 12/04/2008>75% 02/05/2009>75% 08/04/2009 -PFT's 01/04/2009 2.56 (86%) ratio 75, no resp to B2 and DLC0 67% > 80 after correction   . Anal fissure 03/11/2009  . RECTAL BLEEDING 03/11/2009  . COLONIC POLYPS, HX OF 07/25/2006    ADENOMATOUS POLYP  . Migraine headache   . Chronic pain   . Chronic nausea   . COPD (chronic obstructive pulmonary disease) ([place])   . Asthma   . Anxiety and depression   . Uterine cyst   . Endometriosis   . Bowel  obstruction ([place])   . Colon polyps   . Internal hemorrhoids   . Cancer (HCC)     cervical cancer  . FIBROMYALGIA 08/31/2007  . Arthritis   . Anemia   . Anxiety   . Hyperlipidemia   . Seizures ([place])     been about 1 year since last seisure per pt  . Bipolar 1 disorder ([place])   . PONV (postoperative nausea and vomiting)   . Thyroid disease     She has past surgical history that includes Cholecystectomy; Abdominal hysterectomy; Pacemaker insertion; Bladder surgery; interstitial cystitis; bladder stretching x6; replaced bladder pacemaker; removal of uterine cyst and scrapped uterus; Colonoscopy; and Colonoscopy with propofol (N/A, 09/03/2014).   Her family history includes Asthma in her maternal grandmother; Cancer in her maternal grandfather and other; Emphysema in her maternal grandfather; Heart disease in her father. There is no history of Colon cancer, Esophageal cancer, Rectal cancer, Stomach cancer, or Thyroid disease.She reports that she has been smoking Cigarettes.  She has a 7 pack-year smoking history. She has never used smokeless tobacco. She reports that she drinks alcohol. She reports that she uses illicit drugs (Marijuana).  Current Outpatient Prescriptions on File Prior to Visit  Medication Sig Dispense Refill  . cetirizine (ZYRTEC) 10 MG tablet Take 10 mg by mouth at bedtime as needed for allergies. Reported on 03/31/2015    . Cyanocobalamin (VITAMIN B 12 PO) Take 1 tablet by mouth every evening.    . cyclobenzaprine (FLEXERIL) 10 MG tablet Take 1 tablet (10 mg total) by mouth 2 (two) times daily as needed for  muscle spasms. 7 tablet 0  . doxazosin (CARDURA) 2 MG tablet TAKE ONE TABLET BY MOUTH AT BEDTIME 30 tablet 5  . HYDROcodone-acetaminophen (NORCO) 5-325 MG tablet Take 1-2 tablets by mouth every 6 (six) hours as needed for moderate pain. 30 tablet 0  . hyoscyamine (LEVSIN, ANASPAZ) 0.125 MG tablet Take 0.125 mg by mouth every 4 (four) hours as needed for cramping.    .  lidocaine (LIDODERM) 5 % Place 1 patch onto the skin daily. Remove & Discard patch within 12 hours or as directed by MD 3 patch 0  . methimazole (TAPAZOLE) 5 MG tablet Take 1 tablet (5 mg total) by mouth 3 (three) times a week. 13 tablet 2  . prochlorperazine (COMPAZINE) 10 MG tablet Take 10 mg by mouth every 6 (six) hours as needed for nausea or vomiting.     . promethazine (PHENERGAN) 25 MG suppository Place 1 suppository (25 mg total) rectally every 6 (six) hours as needed for nausea or vomiting. 3 each 0  . QUEtiapine (SEROQUEL) 300 MG tablet TAKE ONE TABLET BY MOUTH TWICE DAILY 60 tablet 1  . ranitidine (ZANTAC) 150 MG tablet Take 1 tablet (150 mg total) by mouth 2 (two) times daily. 60 tablet 2  . sertraline (ZOLOFT) 100 MG tablet Take 1 tablet (100 mg total) by mouth daily. 90 tablet 3  . SYMBICORT 160-4.5 MCG/ACT inhaler INHALE TWO PUFFS BY MOUTH TWICE DAILY 1 Inhaler 5  . Tetrahydrozoline HCl (VISINE OP) Apply 2 drops to eye 2 (two) times daily as needed (dry eyes).    . traZODone (DESYREL) 50 MG tablet TAKE TWO TABLETS BY MOUTH AT BEDTIME 60 tablet 0  . VENTOLIN HFA 108 (90 BASE) MCG/ACT inhaler INHALE TWO PUFFS BY MOUTH EVERY 4 HOURS AS NEEDED FOR WHEEZING OR SHORTNESS OF BREATH 18 each 5  . [DISCONTINUED] amitriptyline (ELAVIL) 25 MG tablet 2 tablets by mouth at bedtime     . [DISCONTINUED] clidinium-chlordiazePOXIDE (LIBRAX) 2.5-5 MG per capsule 2 capsules by mouth every morning and 1 at bedtime     . [DISCONTINUED] escitalopram (LEXAPRO) 10 MG tablet Take 10 mg by mouth daily.       No current facility-administered medications on file prior to visit.     Objective:  Objective Physical Exam  Constitutional: She is oriented to person, place, and time. She appears well-developed and well-nourished.  HENT:  Head: Normocephalic and atraumatic.  Eyes: Conjunctivae and EOM are normal.  Neck: Normal range of motion. Neck supple. No JVD present. Carotid bruit is not present. No  thyromegaly present.  Cardiovascular: Normal rate, regular rhythm and normal heart sounds.   No murmur heard. Pulmonary/Chest: Effort normal and breath sounds normal. No respiratory distress. She has no wheezes. She has no rales. She exhibits no tenderness.  Abdominal: Soft. Bowel sounds are normal. She exhibits distension. There is tenderness in the epigastric area, periumbilical area and suprapubic area. There is guarding. There is no rebound.  Musculoskeletal: She exhibits no edema.  Neurological: She is alert and oriented to person, place, and time.  Psychiatric: She has a normal mood and affect. Her behavior is normal. Judgment and thought content normal.  Nursing note and vitals reviewed.  BP 102/66 mmHg  Pulse 76  Temp(Src) 97.8 F (36.6 C) (Oral)  Wt 106 lb 3.2 oz (48.172 kg)  SpO2 97% Wt Readings from Last 3 Encounters:  05/06/15 106 lb 3.2 oz (48.172 kg)  05/05/15 102 lb (46.267 kg)  03/31/15 107 lb 12.8 oz (48.898 kg)  Lab Results  Component Value Date   WBC 7.4 05/05/2015   HGB 13.4 05/05/2015   HCT 40.3 05/05/2015   PLT 326 05/05/2015   GLUCOSE 110* 05/05/2015   CHOL 127 07/19/2009   TRIG 130.0 07/19/2009   HDL 42.80 07/19/2009   LDLCALC 58 07/19/2009   ALT 11* 05/05/2015   AST 16 05/05/2015   NA 138 05/05/2015   K 3.7 05/05/2015   CL 107 05/05/2015   CREATININE 0.78 05/05/2015   BUN 9 05/05/2015   CO2 24 05/05/2015   TSH 0.330* 01/27/2015   HGBA1C 4.9 08/10/2014    Dg Lumbar Spine Complete  05/05/2015  CLINICAL DATA:  Chronic lumbago. Fall earlier today. Radicular symptoms in both lower extremities EXAM: LUMBAR SPINE - COMPLETE 4+ VIEW COMPARISON:  January 28, 2015 FINDINGS: Frontal, lateral, spot lumbosacral lateral, and bilateral oblique views were obtained. There are 5 non-rib-bearing lumbar type vertebral bodies. There is no fracture or spondylolisthesis. Disc spaces appear normal. There is a stimulator on the left overlying the left upper sacrum  with the tip of the stimulator in the posterior mid pelvic regions slightly to the left of midline, unchanged. There is mild facet osteoarthritic change at L5-S1 bilaterally. Surgical clips noted in right upper quadrant region. IMPRESSION: Stimulator in left pelvic region, stable. No fracture or spondylolisthesis. Stable mild facet osteoarthritic change at L5-S1. Electronically Signed   By: Lowella Grip III M.D.   On: 05/05/2015 14:32     Assessment & Plan:  Plan I have discontinued Ms. Agrusa's omeprazole, guaiFENesin-codeine, amoxicillin-clavulanate, predniSONE, and clonazePAM. I am also having her start on dexlansoprazole and diazepam. Additionally, I am having her maintain her cetirizine, prochlorperazine, sertraline, Cyanocobalamin (VITAMIN B 12 PO), hyoscyamine, Tetrahydrozoline HCl (VISINE OP), HYDROcodone-acetaminophen, ranitidine, SYMBICORT, VENTOLIN HFA, methimazole, doxazosin, QUEtiapine, traZODone, cyclobenzaprine, lidocaine, and promethazine.  Meds ordered this encounter  Medications  . dexlansoprazole (DEXILANT) 60 MG capsule    Sig: Take 1 capsule (60 mg total) by mouth daily.    Dispense:  30 capsule    Refill:  11  . diazepam (VALIUM) 5 MG tablet    Sig: Take 1 tablet (5 mg total) by mouth every 12 (twelve) hours as needed for anxiety.    Dispense:  60 tablet    Refill:  1    Problem List Items Addressed This Visit      Unprioritized   Anxiety    con't seroquel and zoloft Will d/c klonopin and start valium Pt will look for a new psych       Relevant Medications   diazepam (VALIUM) 5 MG tablet    Other Visit Diagnoses    Generalized abdominal pain    -  Primary    Relevant Medications    dexlansoprazole (DEXILANT) 60 MG capsule    Other Relevant Orders    POCT urinalysis dipstick (Completed)    POCT urine pregnancy (Completed)    Ambulatory referral to Gastroenterology    Nausea without vomiting        Relevant Orders    Ambulatory referral to  Gastroenterology    Generalized anxiety disorder        Relevant Medications    diazepam (VALIUM) 5 MG tablet       Follow-up: Return if symptoms worsen or fail to improve.  Garnet Koyanagi, DO

## 2015-05-06 NOTE — Patient Instructions (Signed)
Generalized Anxiety Disorder Generalized anxiety disorder (GAD) is a mental disorder. It interferes with life functions, including relationships, work, and school. GAD is different from normal anxiety, which everyone experiences at some point in their lives in response to specific life events and activities. Normal anxiety actually helps us prepare for and get through these life events and activities. Normal anxiety goes away after the event or activity is over.  GAD causes anxiety that is not necessarily related to specific events or activities. It also causes excess anxiety in proportion to specific events or activities. The anxiety associated with GAD is also difficult to control. GAD can vary from mild to severe. People with severe GAD can have intense waves of anxiety with physical symptoms (panic attacks).  SYMPTOMS The anxiety and worry associated with GAD are difficult to control. This anxiety and worry are related to many life events and activities and also occur more days than not for 6 months or longer. People with GAD also have three or more of the following symptoms (one or more in children):  Restlessness.   Fatigue.  Difficulty concentrating.   Irritability.  Muscle tension.  Difficulty sleeping or unsatisfying sleep. DIAGNOSIS GAD is diagnosed through an assessment by your health care provider. Your health care provider will ask you questions aboutyour mood,physical symptoms, and events in your life. Your health care provider may ask you about your medical history and use of alcohol or drugs, including prescription medicines. Your health care provider may also do a physical exam and blood tests. Certain medical conditions and the use of certain substances can cause symptoms similar to those associated with GAD. Your health care provider may refer you to a mental health specialist for further evaluation. TREATMENT The following therapies are usually used to treat GAD:    Medication. Antidepressant medication usually is prescribed for long-term daily control. Antianxiety medicines may be added in severe cases, especially when panic attacks occur.   Talk therapy (psychotherapy). Certain types of talk therapy can be helpful in treating GAD by providing support, education, and guidance. A form of talk therapy called cognitive behavioral therapy can teach you healthy ways to think about and react to daily life events and activities.  Stress managementtechniques. These include yoga, meditation, and exercise and can be very helpful when they are practiced regularly. A mental health specialist can help determine which treatment is best for you. Some people see improvement with one therapy. However, other people require a combination of therapies.   This information is not intended to replace advice given to you by your health care provider. Make sure you discuss any questions you have with your health care provider.   Document Released: 07/22/2012 Document Revised: 04/17/2014 Document Reviewed: 07/22/2012 Elsevier Interactive Patient Education 2016 [employer]  

## 2015-05-06 NOTE — Progress Notes (Signed)
Pre visit review using our clinic review tool, if applicable. No additional management support is needed unless otherwise documented below in the visit note. 

## 2015-05-06 NOTE — Assessment & Plan Note (Addendum)
con't seroquel and zoloft Will d/c klonopin and start valium Pt will look for a new psych  Suspect this may be contributing to the abd pain

## 2015-05-06 NOTE — Telephone Encounter (Signed)
noted 

## 2015-05-07 ENCOUNTER — Encounter: Payer: Self-pay | Admitting: Physician Assistant

## 2015-05-10 ENCOUNTER — Other Ambulatory Visit: Payer: Self-pay | Admitting: Family Medicine

## 2015-05-10 NOTE — Telephone Encounter (Signed)
Last seen 05/06/15 and filled 03/30/15 #[id number]  Please advise    KP

## 2015-05-17 ENCOUNTER — Other Ambulatory Visit: Payer: Self-pay | Admitting: Endocrinology

## 2015-05-25 ENCOUNTER — Ambulatory Visit: Payer: Self-pay | Admitting: Endocrinology

## 2015-05-26 ENCOUNTER — Telehealth: Payer: Self-pay | Admitting: Endocrinology

## 2015-05-26 NOTE — Telephone Encounter (Signed)
Please come back for a follow-up appointment in 2 weeks 

## 2015-05-26 NOTE — Telephone Encounter (Signed)
Patient no showed today's appt. Please advise on how to follow up. °A. No follow up necessary. °B. Follow up urgent. Contact patient immediately. °C. Follow up necessary. Contact patient and schedule visit in ___ days. °D. Follow up advised. Contact patient and schedule visit in ____weeks. ° °

## 2015-05-27 NOTE — Telephone Encounter (Signed)
[name], Could you contact the pt and reschedule.  Thanks!

## 2015-05-28 NOTE — Telephone Encounter (Signed)
Lm for patient to call and rescheduled.  Unice Cobble will be taking over Dr Loanne Drilling no shows from know on, I will finish all the one you sent me. Thank you

## 2015-06-02 ENCOUNTER — Telehealth: Payer: Self-pay | Admitting: Family Medicine

## 2015-06-02 MED FILL — HYDROCODON-APAP 10-325: 10-325 | 27 days supply | Qty: 110 | Fill #0

## 2015-06-02 NOTE — Telephone Encounter (Signed)
Pt has an appt scheduled with Dr. Etter Sjogren for tomorrow (06/03/15) at 1pm.

## 2015-06-02 NOTE — Telephone Encounter (Signed)
Patient Name: [name]  DOB: [birth year]    Initial Comment Caller states has high BP 143/128. Was seen at Dr. Amalia Hailey office(urologist) 30 min ago. Told her to call PCP office. Feeling ok.   Nurse Assessment  Nurse: Julien Girt, RN, Almyra Free Date/Time Eilene Ghazi Time): 06/02/2015 3:48:37 PM  Confirm and document reason for call. If symptomatic, describe symptoms. You must click the next button to save text entered. ---Caller states she just left her urologist appointment, in the office her BP was 143/128 then 160/99, and she was told to FU with her PCP. States she has had a dull headache since Sunday and she feels tired. Adds she has had diarrhea since Saturday, was vomiting but that subsided. She has had 4 episodes of diarrhea today.  Has the patient traveled out of the country within the last 30 days? ---Not Applicable  Does the patient have any new or worsening symptoms? ---Yes  Will a triage be completed? ---Yes  Related visit to physician within the last 2 weeks? ---No  Does the PT have any chronic conditions? (i.e. diabetes, asthma, etc.) ---Yes  List chronic conditions. ---Bladder disease,/ Interstitial cystitis, Asthma, COPD  Is the patient pregnant or possibly pregnant? (Ask all females between the ages of 41-55) ---No  Is this a behavioral health or substance abuse call? ---No     Guidelines    Guideline Title Affirmed Question Affirmed Notes  High Blood Pressure [1] BP ? 140/90 AND [2] not taking BP medications    Final Disposition User   See Physician within 24 Hours Nara Visa, RN, Almyra Free    Comments  Upgraded to be seen within the next 24 hrs due to pt's concern. Zykeria verbalized understanding and will cb as needed if her sx worsen.   Referrals  REFERRED TO PCP OFFICE   Disagree/Comply: Comply

## 2015-06-02 NOTE — Telephone Encounter (Signed)
Patient called stating that she was seen about 30 minutes ago at her Urologist and her BP was 143/128. She was not given anything by her Urologist. She was sent home and informed to call her PCP. Spoke with [employer].

## 2015-06-03 ENCOUNTER — Ambulatory Visit: Payer: Self-pay | Admitting: Physician Assistant

## 2015-06-03 ENCOUNTER — Telehealth: Payer: Self-pay | Admitting: Emergency Medicine

## 2015-06-03 ENCOUNTER — Ambulatory Visit (INDEPENDENT_AMBULATORY_CARE_PROVIDER_SITE_OTHER): Payer: BLUE CROSS/BLUE SHIELD | Admitting: Family Medicine

## 2015-06-03 ENCOUNTER — Encounter: Payer: Self-pay | Admitting: Family Medicine

## 2015-06-03 VITALS — BP 116/86 | HR 109 | Temp 99.0°F | Wt 103.2 lb

## 2015-06-03 DIAGNOSIS — R002 Palpitations: Secondary | ICD-10-CM

## 2015-06-03 DIAGNOSIS — R062 Wheezing: Secondary | ICD-10-CM

## 2015-06-03 DIAGNOSIS — J441 Chronic obstructive pulmonary disease with (acute) exacerbation: Secondary | ICD-10-CM

## 2015-06-03 DIAGNOSIS — R0789 Other chest pain: Secondary | ICD-10-CM | POA: Diagnosis not present

## 2015-06-03 LAB — D-DIMER, QUANTITATIVE: D-Dimer, Quant: 0.38 ug/mL-FEU (ref 0.00–0.48)

## 2015-06-03 MED ORDER — TIOTROPIUM BROMIDE MONOHYDRATE 18 MCG IN CAPS: ORAL_CAPSULE | RESPIRATORY_TRACT | Status: DC

## 2015-06-03 MED FILL — diazePAM 5 MG TABS: 5 | 30 days supply | Qty: 60 | Fill #1

## 2015-06-03 NOTE — Progress Notes (Signed)
Pre visit review using our clinic review tool, if applicable. No additional management support is needed unless otherwise documented below in the visit note. 

## 2015-06-03 NOTE — Telephone Encounter (Signed)
i saw it already ---thanks

## 2015-06-03 NOTE — Telephone Encounter (Signed)
Kim from [place] called with Stat  Results D-Dimer is 0.38

## 2015-06-03 NOTE — Progress Notes (Signed)
Patient ID: [name], female    DOB: 09/30/1975  Age: 40 y.o. MRN: [id number]    Subjective:  Subjective HPI [name] presents c/o elevated bp at urology office yesterday. It is good today but she is having palpitations and chest pains.  She is still wheezing as well.   No sob, no chest pain    Review of Systems  Constitutional: Negative for diaphoresis, appetite change, fatigue and unexpected weight change.  Eyes: Negative for pain, redness and visual disturbance.  Respiratory: Positive for chest tightness and wheezing. Negative for cough and shortness of breath.   Cardiovascular: Positive for chest pain and palpitations. Negative for leg swelling.  Endocrine: Negative for cold intolerance, heat intolerance, polydipsia, polyphagia and polyuria.  Genitourinary: Negative for dysuria, frequency and difficulty urinating.  Neurological: Negative for dizziness, light-headedness, numbness and headaches.    History Past Medical History  Diagnosis Date  . 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 SLEEP APNEA 08/31/2007  . 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 DEPRESSION 08/31/2007  . ARTHRITIS 08/31/2007  . HYPERTENSION 08/31/2007  . Chronic interstitial cystitis 03/11/2009  . IBS 03/11/2009  . GERD 02/05/2009  . BRONCHITIS, RECURRENT 08/23/2009    Asthmatic Bronchitis-Dr. Melvyn Novas.....-HFA 75% 12/04/2008>75% 02/05/2009>75% 08/04/2009 -PFT's 01/04/2009 2.56 (86%) ratio 75, no resp to B2 and DLC0 67% > 80 after correction   . Anal fissure 03/11/2009  . RECTAL BLEEDING 03/11/2009  . COLONIC POLYPS, HX OF 07/25/2006    ADENOMATOUS POLYP  . Migraine headache   . Chronic pain   . Chronic nausea   . COPD (chronic obstructive pulmonary disease) ([place])   . Asthma   . Anxiety and depression   . Uterine cyst   . Endometriosis   . Bowel obstruction ([place])   . Colon polyps   . Internal hemorrhoids   . Cancer (HCC)     cervical cancer  . FIBROMYALGIA 08/31/2007  .  Arthritis   . Anemia   . Anxiety   . Hyperlipidemia   . Seizures ([place])     been about 1 year since last seisure per pt  . Bipolar 1 disorder ([place])   . PONV (postoperative nausea and vomiting)   . Thyroid disease     She has past surgical history that includes Cholecystectomy; Abdominal hysterectomy; Pacemaker insertion; Bladder surgery; interstitial cystitis; bladder stretching x6; replaced bladder pacemaker; removal of uterine cyst and scrapped uterus; Colonoscopy; and Colonoscopy with propofol (N/A, 09/03/2014).   Her family history includes Asthma in her maternal grandmother; Cancer in her maternal grandfather and other; Emphysema in her maternal grandfather; Heart disease in her father. There is no history of Colon cancer, Esophageal cancer, Rectal cancer, Stomach cancer, or Thyroid disease.She reports that she has been smoking Cigarettes.  She has a 7 pack-year smoking history. She has never used smokeless tobacco. She reports that she drinks alcohol. She reports that she uses illicit drugs (Marijuana).  Current Outpatient Prescriptions on File Prior to Visit  Medication Sig Dispense Refill  . cetirizine (ZYRTEC) 10 MG tablet Take 10 mg by mouth at bedtime as needed for allergies. Reported on 03/31/2015    . Cyanocobalamin (VITAMIN B 12 PO) Take 1 tablet by mouth every evening.    . cyclobenzaprine (FLEXERIL) 10 MG tablet Take 1 tablet (10 mg total) by mouth 2 (two) times daily as needed for muscle spasms. 7 tablet 0  . dexlansoprazole (DEXILANT) 60 MG capsule Take 1 capsule (60 mg total) by mouth daily. 30 capsule  11  . diazepam (VALIUM) 5 MG tablet Take 1 tablet (5 mg total) by mouth every 12 (twelve) hours as needed for anxiety. 60 tablet 1  . doxazosin (CARDURA) 2 MG tablet TAKE ONE TABLET BY MOUTH AT BEDTIME 30 tablet 5  . HYDROcodone-acetaminophen (NORCO) 5-325 MG tablet Take 1-2 tablets by mouth every 6 (six) hours as needed for moderate pain. 30 tablet 0  . hyoscyamine (LEVSIN,  ANASPAZ) 0.125 MG tablet Take 0.125 mg by mouth every 4 (four) hours as needed for cramping.    . methimazole (TAPAZOLE) 5 MG tablet TAKE ONE TABLET BY MOUTH THREE TIMES A WEEK 13 tablet 0  . prochlorperazine (COMPAZINE) 10 MG tablet Take 10 mg by mouth every 6 (six) hours as needed for nausea or vomiting.     . promethazine (PHENERGAN) 25 MG suppository Place 1 suppository (25 mg total) rectally every 6 (six) hours as needed for nausea or vomiting. 3 each 0  . QUEtiapine (SEROQUEL) 300 MG tablet TAKE ONE TABLET BY MOUTH TWICE DAILY 60 tablet 0  . ranitidine (ZANTAC) 150 MG tablet Take 1 tablet (150 mg total) by mouth 2 (two) times daily. 60 tablet 2  . sertraline (ZOLOFT) 100 MG tablet Take 1 tablet (100 mg total) by mouth daily. 90 tablet 3  . SYMBICORT 160-4.5 MCG/ACT inhaler INHALE TWO PUFFS BY MOUTH TWICE DAILY 1 Inhaler 5  . Tetrahydrozoline HCl (VISINE OP) Apply 2 drops to eye 2 (two) times daily as needed (dry eyes).    . traZODone (DESYREL) 50 MG tablet TAKE TWO TABLETS BY MOUTH AT BEDTIME 60 tablet 0  . VENTOLIN HFA 108 (90 BASE) MCG/ACT inhaler INHALE TWO PUFFS BY MOUTH EVERY 4 HOURS AS NEEDED FOR WHEEZING OR SHORTNESS OF BREATH 18 each 5  . [DISCONTINUED] amitriptyline (ELAVIL) 25 MG tablet 2 tablets by mouth at bedtime     . [DISCONTINUED] clidinium-chlordiazePOXIDE (LIBRAX) 2.5-5 MG per capsule 2 capsules by mouth every morning and 1 at bedtime     . [DISCONTINUED] escitalopram (LEXAPRO) 10 MG tablet Take 10 mg by mouth daily.       No current facility-administered medications on file prior to visit.     Objective:  Objective Physical Exam  Constitutional: She is oriented to person, place, and time. She appears well-developed and well-nourished.  HENT:  Head: Normocephalic and atraumatic.  Eyes: Conjunctivae and EOM are normal.  Neck: Normal range of motion. Neck supple. No JVD present. Carotid bruit is not present. No thyromegaly present.  Cardiovascular: Normal rate, regular  rhythm and normal heart sounds.   No murmur heard. Pulmonary/Chest: Effort normal. No respiratory distress. She has wheezes. She has no rales. She exhibits no tenderness.  Musculoskeletal: She exhibits no edema.  Neurological: She is alert and oriented to person, place, and time.  Psychiatric: She has a normal mood and affect. Her behavior is normal. Judgment and thought content normal.  Nursing note and vitals reviewed.  BP 116/86 mmHg  Pulse 109  Temp(Src) 99 F (37.2 C) (Oral)  Wt 103 lb 3.2 oz (46.811 kg)  SpO2 98% Wt Readings from Last 3 Encounters:  06/03/15 103 lb 3.2 oz (46.811 kg)  05/06/15 106 lb 3.2 oz (48.172 kg)  05/05/15 102 lb (46.267 kg)     Lab Results  Component Value Date   WBC 7.4 05/05/2015   HGB 13.4 05/05/2015   HCT 40.3 05/05/2015   PLT 326 05/05/2015   GLUCOSE 110* 05/05/2015   CHOL 127 07/19/2009   TRIG 130.0  07/19/2009   HDL 42.80 07/19/2009   LDLCALC 58 07/19/2009   ALT 11* 05/05/2015   AST 16 05/05/2015   NA 138 05/05/2015   K 3.7 05/05/2015   CL 107 05/05/2015   CREATININE 0.78 05/05/2015   BUN 9 05/05/2015   CO2 24 05/05/2015   TSH 0.330* 01/27/2015   HGBA1C 4.9 08/10/2014   ekg-- nsr  Dg Lumbar Spine Complete  05/05/2015  CLINICAL DATA:  Chronic lumbago. Fall earlier today. Radicular symptoms in both lower extremities EXAM: LUMBAR SPINE - COMPLETE 4+ VIEW COMPARISON:  January 28, 2015 FINDINGS: Frontal, lateral, spot lumbosacral lateral, and bilateral oblique views were obtained. There are 5 non-rib-bearing lumbar type vertebral bodies. There is no fracture or spondylolisthesis. Disc spaces appear normal. There is a stimulator on the left overlying the left upper sacrum with the tip of the stimulator in the posterior mid pelvic regions slightly to the left of midline, unchanged. There is mild facet osteoarthritic change at L5-S1 bilaterally. Surgical clips noted in right upper quadrant region. IMPRESSION: Stimulator in left pelvic region,  stable. No fracture or spondylolisthesis. Stable mild facet osteoarthritic change at L5-S1. Electronically Signed   By: Lowella Grip III M.D.   On: 05/05/2015 14:32     Assessment & Plan:  Plan I have discontinued [name]'s lidocaine. I am also having her start on tiotropium. Additionally, I am having her maintain her cetirizine, prochlorperazine, sertraline, Cyanocobalamin (VITAMIN B 12 PO), hyoscyamine, Tetrahydrozoline HCl (VISINE OP), HYDROcodone-acetaminophen, ranitidine, SYMBICORT, VENTOLIN HFA, doxazosin, cyclobenzaprine, promethazine, dexlansoprazole, diazepam, traZODone, QUEtiapine, and methimazole.  Meds ordered this encounter  Medications  . tiotropium (SPIRIVA HANDIHALER) 18 MCG inhalation capsule    Sig: 1 cap inhaled qd    Dispense:  30 capsule    Refill:  12    Problem List Items Addressed This Visit      Unprioritized   COPD exacerbation ([place])    con't inhalers Add spiriva cxr        Relevant Medications   tiotropium (SPIRIVA HANDIHALER) 18 MCG inhalation capsule    Other Visit Diagnoses    Other chest pain    -  Primary    Relevant Orders    EKG 12-Lead (Completed)    Palpitations        Relevant Orders    Comp Met (CMET)    D-Dimer, Quantitative (Completed)    CBC w/Diff    TSH    Wheezing        Relevant Orders    DG Chest 2 View       Follow-up: Return if symptoms worsen or fail to improve.  Garnet Koyanagi, DO

## 2015-06-04 LAB — COMPREHENSIVE METABOLIC PANEL
ALT: 8 U/L (ref 0–35)
AST: 15 U/L (ref 0–37)
Albumin: 4.3 g/dL (ref 3.5–5.2)
Alkaline Phosphatase: 84 U/L (ref 39–117)
BUN: 5 mg/dL — ABNORMAL LOW (ref 6–23)
CO2: 30 mEq/L (ref 19–32)
Calcium: 9.6 mg/dL (ref 8.4–10.5)
Chloride: 101 mEq/L (ref 96–112)
Creatinine, Ser: 1.16 mg/dL (ref 0.40–1.20)
GFR: 55.25 mL/min — ABNORMAL LOW (ref >60.00)
Glucose, Bld: 81 mg/dL (ref 70–99)
Potassium: 2.9 mEq/L — ABNORMAL LOW (ref 3.5–5.1)
Sodium: 136 mEq/L (ref 135–145)
Total Bilirubin: 0.3 mg/dL (ref 0.2–1.2)
Total Protein: 7.6 g/dL (ref 6.0–8.3)

## 2015-06-04 LAB — CBC WITH DIFFERENTIAL/PLATELET
Basophils Absolute: 0.1 10*3/uL (ref 0.0–0.1)
Basophils Relative: 0.7 % (ref 0.0–3.0)
Eosinophils Absolute: 0.1 10*3/uL (ref 0.0–0.7)
Eosinophils Relative: 1.5 % (ref 0.0–5.0)
HCT: 39.8 % (ref 36.0–46.0)
Hemoglobin: 13.4 g/dL (ref 12.0–15.0)
Lymphocytes Relative: 45 % (ref 12.0–46.0)
Lymphs Abs: 4.4 10*3/uL — ABNORMAL HIGH (ref 0.7–4.0)
MCHC: 33.8 g/dL (ref 30.0–36.0)
MCV: 93.1 fl (ref 78.0–100.0)
Monocytes Absolute: 0.2 10*3/uL (ref 0.1–1.0)
Monocytes Relative: 2.1 % — ABNORMAL LOW (ref 3.0–12.0)
Neutro Abs: 5 10*3/uL (ref 1.4–7.7)
Neutrophils Relative %: 50.7 % (ref 43.0–77.0)
Platelets: 408 10*3/uL — ABNORMAL HIGH (ref 150.0–400.0)
RBC: 4.27 Mil/uL (ref 3.87–5.11)
RDW: 13 % (ref 11.5–15.5)
WBC: 9.9 10*3/uL (ref 4.0–10.5)

## 2015-06-04 LAB — TSH: TSH: 1.39 u[IU]/mL (ref 0.35–4.50)

## 2015-06-04 NOTE — Assessment & Plan Note (Addendum)
con't inhalers Add spiriva cxr

## 2015-06-07 ENCOUNTER — Ambulatory Visit (HOSPITAL_BASED_OUTPATIENT_CLINIC_OR_DEPARTMENT_OTHER)
Admission: RE | Admit: 2015-06-07 | Discharge: 2015-06-07 | Disposition: A | Discharge status: Home / Self Care | Payer: BLUE CROSS/BLUE SHIELD | Source: Ambulatory Visit | Attending: Family Medicine | Admitting: Family Medicine

## 2015-06-07 DIAGNOSIS — R062 Wheezing: Secondary | ICD-10-CM | POA: Insufficient documentation

## 2015-06-08 ENCOUNTER — Telehealth: Payer: Self-pay | Admitting: Family Medicine

## 2015-06-08 MED ORDER — POTASSIUM CHLORIDE CRYS ER 20 MEQ PO TBCR: 20.0000 meq | EXTENDED_RELEASE_TABLET | Freq: Every day | ORAL | Status: DC

## 2015-06-08 NOTE — Telephone Encounter (Signed)
Notes Recorded by Ricky Ala, RMA on 06/08/2015 at 1:50 PM Notified patient of normal xray.

## 2015-06-08 NOTE — Telephone Encounter (Signed)
Relation to [id number] Call back number:[phone]   Reason for call:  Patient inquiring about chest x ray results

## 2015-06-08 NOTE — Addendum Note (Signed)
Addended by: Ricky Ala on: 06/08/2015 02:10 PM   Modules accepted: Orders

## 2015-06-14 ENCOUNTER — Encounter: Payer: Self-pay | Admitting: Family Medicine

## 2015-06-15 ENCOUNTER — Other Ambulatory Visit: Payer: Self-pay | Admitting: Family Medicine

## 2015-06-15 NOTE — Telephone Encounter (Signed)
Last seen 06/03/15 and filled 05/10/15 #60   Please advise    KP

## 2015-06-29 MED FILL — HYDROCODON-APAP 10-325: 10-325 | 28 days supply | Qty: 110 | Fill #0

## 2015-06-29 MED FILL — raNITIdine HCL 150 MG TABS: 150 | 30 days supply | Qty: 60 | Fill #1

## 2015-06-30 ENCOUNTER — Telehealth: Payer: Self-pay

## 2015-06-30 DIAGNOSIS — F411 Generalized anxiety disorder: Secondary | ICD-10-CM

## 2015-06-30 NOTE — Telephone Encounter (Signed)
Refill x1 

## 2015-06-30 NOTE — Telephone Encounter (Signed)
Last seen 06/03/15 and filled 05/06/15 #60 with 1 refill   Please advise    KP

## 2015-07-01 MED ORDER — DIAZEPAM 5 MG PO TABS: 5.0000 mg | ORAL_TABLET | Freq: Two times a day (BID) | ORAL | Status: DC | PRN

## 2015-07-01 MED FILL — diazePAM 5 MG TABS: 5 | 30 days supply | Qty: 60 | Fill #0

## 2015-07-01 NOTE — Telephone Encounter (Signed)
Rx faxed.    KP 

## 2015-07-06 ENCOUNTER — Other Ambulatory Visit: Payer: Self-pay | Admitting: Endocrinology

## 2015-07-19 ENCOUNTER — Other Ambulatory Visit: Payer: Self-pay | Admitting: Family Medicine

## 2015-07-19 NOTE — Telephone Encounter (Signed)
Last seen 06/03/15 and filled 06/15/15 #60   Please advise    KP

## 2015-08-01 MED FILL — HYDROCODON-APAP 10-325: 10-325 | 28 days supply | Qty: 110 | Fill #0

## 2015-08-02 ENCOUNTER — Other Ambulatory Visit: Payer: Self-pay | Admitting: Family Medicine

## 2015-08-02 NOTE — Telephone Encounter (Signed)
Last seen 06/03/15 and filled 07/01/15 #60    Please advise    KP

## 2015-08-03 MED FILL — diazePAM 5 MG TABS: 5 | 30 days supply | Qty: 60 | Fill #0

## 2015-08-09 ENCOUNTER — Other Ambulatory Visit: Payer: Self-pay | Admitting: Endocrinology

## 2015-08-09 NOTE — Telephone Encounter (Signed)
Please refill x 1 Ov is due  

## 2015-08-09 NOTE — Telephone Encounter (Signed)
Please review and advise.

## 2015-08-16 ENCOUNTER — Other Ambulatory Visit: Payer: Self-pay | Admitting: Family Medicine

## 2015-08-16 NOTE — Telephone Encounter (Signed)
Last seen 06/03/15 and filled 07/19/15 #60   Please advise     KP

## 2015-08-18 ENCOUNTER — Other Ambulatory Visit: Payer: Self-pay | Admitting: Family Medicine

## 2015-08-18 NOTE — Telephone Encounter (Signed)
Called and [facility] @ 5:05pm @ [phone]) asking the pt to RTC regarding medication refill.//AB/CMA

## 2015-08-18 NOTE — Telephone Encounter (Signed)
Rx for Zoloft was approved and sent to the pharmacy by e-script.  The request for the Trazodone was denied-has already been filled.//AB/CMA

## 2015-08-20 ENCOUNTER — Ambulatory Visit (INDEPENDENT_AMBULATORY_CARE_PROVIDER_SITE_OTHER): Payer: BLUE CROSS/BLUE SHIELD | Admitting: Family Medicine

## 2015-08-20 ENCOUNTER — Encounter: Payer: Self-pay | Admitting: Family Medicine

## 2015-08-20 ENCOUNTER — Telehealth: Payer: Self-pay | Admitting: Family Medicine

## 2015-08-20 VITALS — BP 96/60 | HR 86 | Temp 97.6°F | Ht 63.5 in | Wt 113.0 lb

## 2015-08-20 DIAGNOSIS — J209 Acute bronchitis, unspecified: Secondary | ICD-10-CM | POA: Diagnosis not present

## 2015-08-20 MED ORDER — PROMETHAZINE-DM 6.25-15 MG/5ML PO SYRP: 5.0000 mL | ORAL_SOLUTION | Freq: Four times a day (QID) | ORAL | Status: DC | PRN

## 2015-08-20 MED ORDER — AZITHROMYCIN 250 MG PO TABS: ORAL_TABLET | ORAL | Status: DC

## 2015-08-20 MED ORDER — PREDNISONE 10 MG PO TABS
ORAL_TABLET | ORAL | Status: DC
Start: 2015-08-20 — End: 2015-10-18

## 2015-08-20 MED ORDER — IPRATROPIUM-ALBUTEROL 0.5-2.5 (3) MG/3ML IN SOLN
3.0000 mL | Freq: Once | RESPIRATORY_TRACT | Status: AC
  Administered 2015-08-20: 3 mL via RESPIRATORY_TRACT

## 2015-08-20 MED ORDER — METHYLPREDNISOLONE ACETATE 80 MG/ML IJ SUSP
80.0000 mg | Freq: Once | INTRAMUSCULAR | Status: AC
  Administered 2015-08-20: 80 mg via INTRAMUSCULAR

## 2015-08-20 NOTE — Telephone Encounter (Signed)
Pt called in to check the status of new medication refill. Pt says that she is currently at pharmacy and want to be sure.   Please advise pt.    CB: [phone]

## 2015-08-20 NOTE — Telephone Encounter (Signed)
Can be reached: [phone] Pharmacy: [facility] [place], [place]  Reason for call: Pt looked up the previous cough syrup she was on. She wants Dr. Etter Sjogren to send in guaiFENesin-codeine [facility]) 100-10 MG/5ML syrup  And d/c the order for the cough syrup she ordered already. Please call to notify pt if ok to change med.

## 2015-08-20 NOTE — Patient Instructions (Signed)

## 2015-08-20 NOTE — Progress Notes (Signed)
Patient ID: [name], female    DOB: [birth year]  Age: 40 y.o. MRN: [id number]    Subjective:  Subjective HPI [name] presents for cough and congestion x 2 months.   No fever.  + rib pain from coughing.     Review of Systems  Constitutional: Positive for chills. Negative for fever.  HENT: Positive for congestion, postnasal drip, rhinorrhea and sinus pressure.   Respiratory: Positive for cough, chest tightness, shortness of breath and wheezing.   Cardiovascular: Negative for chest pain, palpitations and leg swelling.  Allergic/Immunologic: Negative for environmental allergies.    History Past Medical History  Diagnosis Date  . 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 SLEEP APNEA 08/31/2007  . 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 DEPRESSION 08/31/2007  . ARTHRITIS 08/31/2007  . HYPERTENSION 08/31/2007  . Chronic interstitial cystitis 03/11/2009  . IBS 03/11/2009  . GERD 02/05/2009  . BRONCHITIS, RECURRENT 08/23/2009    Asthmatic Bronchitis-Dr. Melvyn Novas.....-HFA 75% 12/04/2008>75% 02/05/2009>75% 08/04/2009 -PFT's 01/04/2009 2.56 (86%) ratio 75, no resp to B2 and DLC0 67% > 80 after correction   . Anal fissure 03/11/2009  . RECTAL BLEEDING 03/11/2009  . COLONIC POLYPS, HX OF 07/25/2006    ADENOMATOUS POLYP  . Migraine headache   . Chronic pain   . Chronic nausea   . COPD (chronic obstructive pulmonary disease) ([place])   . Asthma   . Anxiety and depression   . Uterine cyst   . Endometriosis   . Bowel obstruction ([place])   . Colon polyps   . Internal hemorrhoids   . Cancer (HCC)     cervical cancer  . FIBROMYALGIA 08/31/2007  . Arthritis   . Anemia   . Anxiety   . Hyperlipidemia   . Seizures ([place])     been about 1 year since last seisure per pt  . Bipolar 1 disorder ([place])   . PONV (postoperative nausea and vomiting)   . Thyroid disease     She has past surgical history that includes Cholecystectomy; Abdominal hysterectomy; Pacemaker insertion;  Bladder surgery; interstitial cystitis; bladder stretching x6; replaced bladder pacemaker; removal of uterine cyst and scrapped uterus; Colonoscopy; and Colonoscopy with propofol (N/A, 09/03/2014).   Her family history includes Asthma in her maternal grandmother; Cancer in her maternal grandfather and other; Emphysema in her maternal grandfather; Heart disease in her father. There is no history of Colon cancer, Esophageal cancer, Rectal cancer, Stomach cancer, or Thyroid disease.She reports that she quit smoking about 4 weeks ago. Her smoking use included Cigarettes. She has a 7 pack-year smoking history. She has never used smokeless tobacco. She reports that she drinks alcohol. She reports that she uses illicit drugs (Marijuana).  Current Outpatient Prescriptions on File Prior to Visit  Medication Sig Dispense Refill  . cetirizine (ZYRTEC) 10 MG tablet Take 10 mg by mouth at bedtime as needed for allergies. Reported on 03/31/2015    . Cyanocobalamin (VITAMIN B 12 PO) Take 1 tablet by mouth every evening.    . cyclobenzaprine (FLEXERIL) 10 MG tablet Take 1 tablet (10 mg total) by mouth 2 (two) times daily as needed for muscle spasms. 7 tablet 0  . dexlansoprazole (DEXILANT) 60 MG capsule Take 1 capsule (60 mg total) by mouth daily. 30 capsule 11  . diazepam (VALIUM) 5 MG tablet TAKE 1 TABLET BY MOUTH EVERY 12 HOURS AS NEEDED FOR ANXIETY 60 tablet 0  . doxazosin (CARDURA) 2 MG tablet TAKE ONE TABLET BY MOUTH AT BEDTIME 30 tablet  5  . hyoscyamine (LEVSIN, ANASPAZ) 0.125 MG tablet Take 0.125 mg by mouth every 4 (four) hours as needed for cramping.    . methimazole (TAPAZOLE) 5 MG tablet TAKE ONE TABLET BY MOUTH THREE TIMES A WEEK. **PT NEEDS APPT WITH DR ELLISON** 13 tablet 1  . prochlorperazine (COMPAZINE) 10 MG tablet Take 10 mg by mouth every 6 (six) hours as needed for nausea or vomiting.     . promethazine (PHENERGAN) 25 MG suppository Place 1 suppository (25 mg total) rectally every 6 (six) hours as  needed for nausea or vomiting. 3 each 0  . QUEtiapine (SEROQUEL) 300 MG tablet TAKE ONE TABLET BY MOUTH TWICE DAILY 60 tablet 5  . ranitidine (ZANTAC) 150 MG tablet Take 1 tablet (150 mg total) by mouth 2 (two) times daily. 60 tablet 2  . sertraline (ZOLOFT) 100 MG tablet TAKE ONE TABLET BY MOUTH ONCE DAILY 90 tablet 1  . SYMBICORT 160-4.5 MCG/ACT inhaler INHALE TWO PUFFS BY MOUTH TWICE DAILY 1 Inhaler 5  . Tetrahydrozoline HCl (VISINE OP) Apply 2 drops to eye 2 (two) times daily as needed (dry eyes).    Marland Kitchen tiotropium (SPIRIVA HANDIHALER) 18 MCG inhalation capsule 1 cap inhaled qd 30 capsule 12  . traZODone (DESYREL) 50 MG tablet TAKE TWO TABLETS BY MOUTH AT BEDTIME 60 tablet 0  . VENTOLIN HFA 108 (90 BASE) MCG/ACT inhaler INHALE TWO PUFFS BY MOUTH EVERY 4 HOURS AS NEEDED FOR WHEEZING OR SHORTNESS OF BREATH 18 each 5  . potassium chloride SA (K-DUR,KLOR-CON) 20 MEQ tablet Take 1 tablet (20 mEq total) by mouth daily. (Patient not taking: Reported on 08/20/2015) 30 tablet 3  . [DISCONTINUED] amitriptyline (ELAVIL) 25 MG tablet 2 tablets by mouth at bedtime     . [DISCONTINUED] clidinium-chlordiazePOXIDE (LIBRAX) 2.5-5 MG per capsule 2 capsules by mouth every morning and 1 at bedtime     . [DISCONTINUED] escitalopram (LEXAPRO) 10 MG tablet Take 10 mg by mouth daily.       No current facility-administered medications on file prior to visit.     Objective:  Objective Physical Exam  Constitutional: She is oriented to person, place, and time. She appears well-developed and well-nourished.  HENT:  Right Ear: External ear normal.  Left Ear: External ear normal.  + PND + errythema  Eyes: Conjunctivae are normal. Right eye exhibits no discharge. Left eye exhibits no discharge.  Cardiovascular: Normal rate, regular rhythm and normal heart sounds.   No murmur heard. Pulmonary/Chest: Effort normal. No respiratory distress. She has wheezes. She has no rales. She exhibits no tenderness.  Musculoskeletal:  She exhibits no edema.  Lymphadenopathy:    She has cervical adenopathy.  Neurological: She is alert and oriented to person, place, and time.  Psychiatric: She has a normal mood and affect. Her behavior is normal. Judgment and thought content normal.  Nursing note and vitals reviewed.  BP 96/60 mmHg  Pulse 86  Temp(Src) 97.6 F (36.4 C) (Oral)  Ht 5' 3.5" (1.613 m)  Wt 113 lb (51.256 kg)  BMI 19.70 kg/m2  SpO2 98% Wt Readings from Last 3 Encounters:  08/20/15 113 lb (51.256 kg)  06/03/15 103 lb 3.2 oz (46.811 kg)  05/06/15 106 lb 3.2 oz (48.172 kg)     Lab Results  Component Value Date   WBC 9.9 06/03/2015   HGB 13.4 06/03/2015   HCT 39.8 06/03/2015   PLT 408.0* 06/03/2015   GLUCOSE 81 06/03/2015   CHOL 127 07/19/2009   TRIG 130.0 07/19/2009  HDL 42.80 07/19/2009   LDLCALC 58 07/19/2009   ALT 8 06/03/2015   AST 15 06/03/2015   NA 136 06/03/2015   K 2.9* 06/03/2015   CL 101 06/03/2015   CREATININE 1.16 06/03/2015   BUN 5* 06/03/2015   CO2 30 06/03/2015   TSH 1.39 06/03/2015   HGBA1C 4.9 08/10/2014    Dg Chest 2 View  06/07/2015  CLINICAL DATA:  Wheezing, cough for 6 months EXAM: CHEST  2 VIEW COMPARISON:  03/31/2015 FINDINGS: Cardiomediastinal silhouette is stable. No acute infiltrate or pleural effusion. No pulmonary edema. Bony thorax is unremarkable. Again noted mild pectus deformity. IMPRESSION: No active cardiopulmonary disease. Electronically Signed   By: Lahoma Crocker M.D.   On: 06/07/2015 16:27     Assessment & Plan:  Plan I am having [name] start on azithromycin, promethazine-dextromethorphan, and predniSONE. I am also having her maintain her cetirizine, prochlorperazine, Cyanocobalamin (VITAMIN B 12 PO), hyoscyamine, Tetrahydrozoline HCl (VISINE OP), ranitidine, SYMBICORT, VENTOLIN HFA, doxazosin, cyclobenzaprine, promethazine, dexlansoprazole, tiotropium, potassium chloride SA, diazepam, methimazole, traZODone, QUEtiapine, sertraline,  HYDROcodone-acetaminophen, and hydrOXYzine.  Meds ordered this encounter  Medications  . HYDROcodone-acetaminophen (NORCO) 10-325 MG tablet    Sig: Take 1 tablet by mouth every 6 hours as needed for pain.    Refill:  0  . hydrOXYzine (ATARAX/VISTARIL) 25 MG tablet    Sig: Take 1 tablet by mouth daily.    Refill:  4  . azithromycin (ZITHROMAX Z-PAK) 250 MG tablet    Sig: As directed    Dispense:  6 each    Refill:  0  . promethazine-dextromethorphan (PROMETHAZINE-DM) 6.25-15 MG/5ML syrup    Sig: Take 5 mLs by mouth 4 (four) times daily as needed.    Dispense:  118 mL    Refill:  0  . predniSONE (DELTASONE) 10 MG tablet    Sig: 3 po qd for 3 days then 2 po qd for 3 days the 1 po qd for 3 days    Dispense:  18 tablet    Refill:  0    Problem List Items Addressed This Visit      Unprioritized   Acute bronchitis - Primary   Relevant Medications   azithromycin (ZITHROMAX Z-PAK) 250 MG tablet   promethazine-dextromethorphan (PROMETHAZINE-DM) 6.25-15 MG/5ML syrup   predniSONE (DELTASONE) 10 MG tablet   Other Relevant Orders   DG Chest 2 View      Follow-up: Return if symptoms worsen or fail to improve.  [name], DO

## 2015-08-20 NOTE — Addendum Note (Signed)
Addended by: Harl Bowie on: 08/20/2015 03:15 PM   Modules accepted: Orders

## 2015-08-20 NOTE — Progress Notes (Signed)
Pre visit review using our clinic review tool, if applicable. No additional management support is needed unless otherwise documented below in the visit note. 

## 2015-08-23 NOTE — Telephone Encounter (Signed)
Caller name: Relation to pt: Call back number: Pharmacy: [facility] [address] [place] [phone] ([phone] (Fax)         Reason for call:  Patient following up stating medication prescribed is making her [place] and would like guaifenesin-codeine syrup

## 2015-08-23 NOTE — Telephone Encounter (Signed)
This cough med has nausea med in it and she has had it before

## 2015-08-23 NOTE — Telephone Encounter (Signed)
Patient following up on request. Patient states that she could not take the medication and prefer to go back to the one with codeine in it.

## 2015-08-23 NOTE — Telephone Encounter (Signed)
Called and spoke with the pt on (08/20/15), and she stated that her son picked up the Promethazine-DM syrup.  She said that the Guaifenesin-codeine syrup is what she was given before.  Informed the pt that Dr. Etter Sjogren has already gone for the day.  Asked the pt if she would like to try the Promethazine-DM syrup over the weekend and let us know Monday if it helped.  Pt agreed.//AB/CMA

## 2015-08-23 NOTE — Telephone Encounter (Signed)
Please advise.//AB/CMA 

## 2015-08-24 NOTE — Telephone Encounter (Signed)
Pt notified and made aware.  No further questions or concerns voiced.

## 2015-08-24 NOTE — Telephone Encounter (Signed)
She is on oxcodone--- can not have cough med with codeine while on pain meds

## 2015-08-27 MED FILL — HYDROCODON-APAP 10-325: 10-325 | 27 days supply | Qty: 110 | Fill #0

## 2015-08-31 ENCOUNTER — Telehealth: Payer: Self-pay

## 2015-08-31 ENCOUNTER — Telehealth: Payer: Self-pay | Admitting: Family Medicine

## 2015-08-31 MED ORDER — DIAZEPAM 5 MG PO TABS: 5.0000 mg | ORAL_TABLET | Freq: Two times a day (BID) | ORAL | Status: DC | PRN

## 2015-08-31 MED FILL — diazePAM 5 MG TABS: 5 | 30 days supply | Qty: 60 | Fill #0

## 2015-08-31 NOTE — Telephone Encounter (Signed)
Pt is requesting refill on Diazepam 5 mg tablet.  Last OV: 08/20/2015 Last Fill: 08/02/2015 #60 and 0RF Pt sig: 1 tablet q12h PRN UDS: 05/06/2015 Low risk  Please advise.

## 2015-08-31 NOTE — Telephone Encounter (Signed)
Pt called in to request Rx refill. Informed pt of the below. She says that she will contact pharmacy to confirm for pick up.

## 2015-08-31 NOTE — Telephone Encounter (Signed)
Rx printed, awaiting DO signature.  

## 2015-08-31 NOTE — Telephone Encounter (Signed)
Rx faxed to [facility].

## 2015-08-31 NOTE — Telephone Encounter (Signed)
Refill x1 

## 2015-09-20 ENCOUNTER — Telehealth: Payer: Self-pay | Admitting: Family Medicine

## 2015-09-20 NOTE — Telephone Encounter (Signed)
Caller name: Relationship to patient: self Can be reached: [phone] Pharmacy: [facility] [place], [place]  Reason for call: Pt misplaced her diazepam. She was out of town and said that she can't find it and the hotel cannot locate it. She said that she has been trying to go without it but she is not able to make it without.

## 2015-09-20 NOTE — Telephone Encounter (Signed)
Last seen 08/20/15 and filled 08/31/15 #60   Please advise    KP

## 2015-09-20 NOTE — Telephone Encounter (Signed)
Last Trazadone Rx 08/16/14, #60. UDS: 05/06/15 and due 11/03/15 Next OV: none scheduled Last OV: 08/2015 for acute visit  Rx printed and forwarded to PCP for signature.

## 2015-09-21 NOTE — Telephone Encounter (Signed)
Unfortunately it is too soon to refill

## 2015-09-21 NOTE — Telephone Encounter (Signed)
Notified pt. 

## 2015-09-27 MED FILL — HYDROCODON-APAP 10-325: 10-325 | 25 days supply | Qty: 100 | Fill #0

## 2015-10-04 MED ORDER — DIAZEPAM 5 MG PO TABS: 5.0000 mg | ORAL_TABLET | Freq: Two times a day (BID) | ORAL | Status: DC | PRN

## 2015-10-04 NOTE — Telephone Encounter (Signed)
Signed script faxed to pharmacy successfully at [phone]. JG//CMA

## 2015-10-04 NOTE — Telephone Encounter (Signed)
Script printed and placed in red folder for signature. JG//CMA

## 2015-10-04 NOTE — Telephone Encounter (Signed)
Patient calling back requesting a refill diazepam (VALIUM) 5 MG tablet

## 2015-10-04 NOTE — Telephone Encounter (Signed)
Diazepam refill Last seen 08/20/15 and filled 08/31/15 #60   Please advise KP

## 2015-10-04 NOTE — Addendum Note (Signed)
Addended by: Murtis Sink A on: 10/04/2015 05:23 PM   Modules accepted: Orders

## 2015-10-04 NOTE — Telephone Encounter (Signed)
Refill x1 

## 2015-10-05 ENCOUNTER — Other Ambulatory Visit: Payer: Self-pay | Admitting: Family Medicine

## 2015-10-16 ENCOUNTER — Encounter (HOSPITAL_BASED_OUTPATIENT_CLINIC_OR_DEPARTMENT_OTHER): Payer: Self-pay | Admitting: Emergency Medicine

## 2015-10-16 ENCOUNTER — Emergency Department (HOSPITAL_BASED_OUTPATIENT_CLINIC_OR_DEPARTMENT_OTHER): Payer: BLUE CROSS/BLUE SHIELD

## 2015-10-16 ENCOUNTER — Emergency Department (HOSPITAL_BASED_OUTPATIENT_CLINIC_OR_DEPARTMENT_OTHER)
Admission: EM | Admit: 2015-10-16 | Discharge: 2015-10-17 | Disposition: A | Discharge status: Home / Self Care | Payer: BLUE CROSS/BLUE SHIELD | Attending: Emergency Medicine | Admitting: Emergency Medicine

## 2015-10-16 DIAGNOSIS — E785 Hyperlipidemia, unspecified: Secondary | ICD-10-CM | POA: Insufficient documentation

## 2015-10-16 DIAGNOSIS — F329 Major depressive disorder, single episode, unspecified: Secondary | ICD-10-CM | POA: Diagnosis not present

## 2015-10-16 DIAGNOSIS — J45909 Unspecified asthma, uncomplicated: Secondary | ICD-10-CM | POA: Diagnosis not present

## 2015-10-16 DIAGNOSIS — E876 Hypokalemia: Secondary | ICD-10-CM | POA: Diagnosis not present

## 2015-10-16 DIAGNOSIS — R51 Headache: Secondary | ICD-10-CM | POA: Diagnosis not present

## 2015-10-16 DIAGNOSIS — J449 Chronic obstructive pulmonary disease, unspecified: Secondary | ICD-10-CM | POA: Diagnosis not present

## 2015-10-16 DIAGNOSIS — Z87891 Personal history of nicotine dependence: Secondary | ICD-10-CM | POA: Insufficient documentation

## 2015-10-16 DIAGNOSIS — R55 Syncope and collapse: Secondary | ICD-10-CM | POA: Diagnosis not present

## 2015-10-16 DIAGNOSIS — Z8541 Personal history of malignant neoplasm of cervix uteri: Secondary | ICD-10-CM | POA: Insufficient documentation

## 2015-10-16 DIAGNOSIS — I10 Essential (primary) hypertension: Secondary | ICD-10-CM | POA: Insufficient documentation

## 2015-10-16 DIAGNOSIS — M5416 Radiculopathy, lumbar region: Secondary | ICD-10-CM

## 2015-10-16 DIAGNOSIS — M545 Low back pain: Secondary | ICD-10-CM | POA: Diagnosis not present

## 2015-10-16 LAB — BASIC METABOLIC PANEL
Anion gap: 9 (ref 5–15)
BUN: 6 mg/dL (ref 6–20)
CO2: 24 mmol/L (ref 22–32)
Calcium: 9.6 mg/dL (ref 8.9–10.3)
Chloride: 106 mmol/L (ref 101–111)
Creatinine, Ser: 0.78 mg/dL (ref 0.44–1.00)
GFR calc Af Amer: >60 mL/min (ref >60)
GFR calc non Af Amer: >60 mL/min (ref >60)
Glucose, Bld: 109 mg/dL — ABNORMAL HIGH (ref 65–99)
Potassium: 3.2 mmol/L — ABNORMAL LOW (ref 3.5–5.1)
Sodium: 139 mmol/L (ref 135–145)

## 2015-10-16 LAB — CBC WITH DIFFERENTIAL/PLATELET
Basophils Absolute: 0 10*3/uL (ref 0.0–0.1)
Basophils Relative: 0 %
Eosinophils Absolute: 0 10*3/uL (ref 0.0–0.7)
Eosinophils Relative: 0 %
HCT: 40.3 % (ref 36.0–46.0)
Hemoglobin: 14.2 g/dL (ref 12.0–15.0)
Lymphocytes Relative: 32 %
Lymphs Abs: 3.9 10*3/uL (ref 0.7–4.0)
MCH: 32.3 pg (ref 26.0–34.0)
MCHC: 35.2 g/dL (ref 30.0–36.0)
MCV: 91.8 fL (ref 78.0–100.0)
Monocytes Absolute: 0.6 10*3/uL (ref 0.1–1.0)
Monocytes Relative: 5 %
Neutro Abs: 7.6 10*3/uL (ref 1.7–7.7)
Neutrophils Relative %: 63 %
Platelets: 357 10*3/uL (ref 150–400)
RBC: 4.39 MIL/uL (ref 3.87–5.11)
RDW: 11.7 % (ref 11.5–15.5)
WBC: 12.1 10*3/uL — ABNORMAL HIGH (ref 4.0–10.5)

## 2015-10-16 MED ORDER — ONDANSETRON 4 MG PO TBDP: 4.0000 mg | ORAL_TABLET | Freq: Once | ORAL | Status: DC

## 2015-10-16 MED ORDER — POTASSIUM CHLORIDE CRYS ER 20 MEQ PO TBCR
40.0000 meq | EXTENDED_RELEASE_TABLET | Freq: Once | ORAL | Status: AC
  Administered 2015-10-17: 40 meq via ORAL
  Filled 2015-10-16: qty 2

## 2015-10-16 MED ORDER — ONDANSETRON HCL 4 MG/2ML IJ SOLN
4.0000 mg | Freq: Once | INTRAMUSCULAR | Status: AC
  Administered 2015-10-16: 4 mg via INTRAVENOUS
  Filled 2015-10-16: qty 2
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
## 2015-10-16 NOTE — ED Notes (Signed)
Pt to xray/ CT, alert, NAD, tearful, emotional with intermttant gagging/ wretching. No emesis noted.

## 2015-10-16 NOTE — ED Notes (Signed)
Back from radiology, c/o pain, wants something for pain.

## 2015-10-16 NOTE — ED Notes (Signed)
Pt in obvious discomfort but NAD, states she has R back pain radiating down leg and up to head. Has a stimulator that she thinks is causing pain.

## 2015-10-17 MED ORDER — HYDROCODONE-ACETAMINOPHEN 5-325 MG PO TABS: 1.0000 | ORAL_TABLET | Freq: Four times a day (QID) | ORAL | Status: DC | PRN

## 2015-10-17 MED ORDER — ONDANSETRON HCL 8 MG PO TABS: 8.0000 mg | ORAL_TABLET | Freq: Three times a day (TID) | ORAL | Status: DC | PRN

## 2015-10-17 MED ORDER — HYDROCODONE-ACETAMINOPHEN 5-325 MG PO TABS
1.0000 | ORAL_TABLET | Freq: Once | ORAL | Status: AC
  Administered 2015-10-17: 1 via ORAL
  Filled 2015-10-17: qty 1

## 2015-10-17 MED ORDER — ONDANSETRON 8 MG PO TBDP
8.0000 mg | ORAL_TABLET | Freq: Once | ORAL | Status: AC
  Administered 2015-10-17: 8 mg via ORAL
  Filled 2015-10-17: qty 1

## 2015-10-17 NOTE — Discharge Instructions (Signed)
Call Dr. Etter Sjogren on Monday, 10/18/2015 regarding your pain and need for medication refills. Asked to be seen in the office and tell her about your fainting episodes as well as headaches. Also call Dr. Amalia Hailey on 10/18/2015 to arrange for follow-up regarding your stimulator.

## 2015-10-17 NOTE — ED Notes (Signed)
Upon returning to room, pt wretching into emesis bag, clear saliva noted.

## 2015-10-17 NOTE — ED Notes (Signed)
Dr. Winfred Leeds into room, RN present for discussion of d/c plan, pt/family OK with d/c plan, denies questions or needs.

## 2015-10-17 NOTE — ED Notes (Signed)
Pt dressed self, up to b/r by w/c by RN, NAD, calm, interactive, tolerating PO meds at this time. Son out to get car. pending d/c instructions.

## 2015-10-18 ENCOUNTER — Telehealth: Payer: Self-pay | Admitting: Medical

## 2015-10-18 ENCOUNTER — Ambulatory Visit (HOSPITAL_BASED_OUTPATIENT_CLINIC_OR_DEPARTMENT_OTHER)
Admission: RE | Admit: 2015-10-18 | Discharge: 2015-10-18 | Disposition: A | Discharge status: Home / Self Care | Payer: BLUE CROSS/BLUE SHIELD | Source: Ambulatory Visit | Attending: Medical | Admitting: Medical

## 2015-10-18 ENCOUNTER — Encounter: Payer: Self-pay | Admitting: Medical

## 2015-10-18 ENCOUNTER — Ambulatory Visit (INDEPENDENT_AMBULATORY_CARE_PROVIDER_SITE_OTHER): Payer: BLUE CROSS/BLUE SHIELD | Admitting: Medical

## 2015-10-18 VITALS — BP 98/74 | HR 77 | Temp 98.1°F | Ht 63.5 in | Wt 108.0 lb

## 2015-10-18 DIAGNOSIS — M5440 Lumbago with sciatica, unspecified side: Secondary | ICD-10-CM | POA: Diagnosis not present

## 2015-10-18 DIAGNOSIS — R059 Cough, unspecified: Secondary | ICD-10-CM

## 2015-10-18 DIAGNOSIS — R05 Cough: Secondary | ICD-10-CM | POA: Insufficient documentation

## 2015-10-18 DIAGNOSIS — J449 Chronic obstructive pulmonary disease, unspecified: Secondary | ICD-10-CM | POA: Diagnosis not present

## 2015-10-18 DIAGNOSIS — R51 Headache: Secondary | ICD-10-CM | POA: Diagnosis not present

## 2015-10-18 DIAGNOSIS — N3090 Cystitis, unspecified without hematuria: Secondary | ICD-10-CM

## 2015-10-18 DIAGNOSIS — R55 Syncope and collapse: Secondary | ICD-10-CM

## 2015-10-18 DIAGNOSIS — R062 Wheezing: Secondary | ICD-10-CM

## 2015-10-18 DIAGNOSIS — R519 Headache, unspecified: Secondary | ICD-10-CM

## 2015-10-18 LAB — POC URINALSYSI DIPSTICK (AUTOMATED)
Bilirubin, UA: NEGATIVE
Blood, UA: NEGATIVE
Glucose, UA: NEGATIVE
Ketones, UA: NEGATIVE
Leukocytes, UA: NEGATIVE
Nitrite, UA: NEGATIVE
Protein, UA: NEGATIVE
Spec Grav, UA: >=1.03
Urobilinogen, UA: 0.2
pH, UA: 6

## 2015-10-18 MED ORDER — HYDROCODONE-ACETAMINOPHEN 5-325 MG PO TABS: 1.0000 | ORAL_TABLET | Freq: Four times a day (QID) | ORAL | Status: DC | PRN

## 2015-10-18 MED ORDER — DOXYCYCLINE HYCLATE 100 MG PO TABS: 100.0000 mg | ORAL_TABLET | Freq: Two times a day (BID) | ORAL | Status: DC

## 2015-10-18 MED ORDER — PREDNISONE 20 MG PO TABS: ORAL_TABLET | ORAL | Status: DC

## 2015-10-18 MED ORDER — ALBUTEROL SULFATE HFA 108 (90 BASE) MCG/ACT IN AERS: 2.0000 | INHALATION_SPRAY | Freq: Four times a day (QID) | RESPIRATORY_TRACT | Status: DC | PRN

## 2015-10-18 MED ORDER — POTASSIUM CHLORIDE ER 10 MEQ PO TBCR: 10.0000 meq | EXTENDED_RELEASE_TABLET | Freq: Every day | ORAL | Status: DC

## 2015-10-18 MED FILL — VENTOLIN HFA 90 MCG INHALER: 108 (90 BAS | 25 days supply | Qty: 18 | Fill #0

## 2015-10-18 MED FILL — POTASSIUM CL 10 MEQ TAB SA: 10 | 14 days supply | Qty: 14 | Fill #0

## 2015-10-18 MED FILL — HYDROCODON-APAP 5-325: 5-325 | 3 days supply | Qty: 12 | Fill #0

## 2015-10-18 MED FILL — predniSONE 20 MG TABS: 20 | 3 days supply | Qty: 6 | Fill #0

## 2015-10-18 NOTE — Telephone Encounter (Signed)
Pt urine needs to have ua and culture.

## 2015-10-18 NOTE — Progress Notes (Signed)
Pre visit review using our clinic review tool, if applicable. No additional management support is needed unless otherwise documented below in the visit note. 
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
## 2015-10-18 NOTE — Telephone Encounter (Signed)
Actually I changed my mind on the azithromycin antibiotic. It interacts with one of her meds so doxycycline is better for her to take.

## 2015-10-18 NOTE — Addendum Note (Signed)
Addended by: Anabel Halon on: 10/18/2015 01:09 PM   Modules accepted: Orders

## 2015-10-18 NOTE — Patient Instructions (Addendum)
For your back pain I will prescribe only 3 days of norco. Will ask you call your insurance to get name of the neurosurgeon who put stimulator in. If you need Mri I think surgeon may be able to consult with [facility] and where to send for studies.  For IC call you urologist office for appointment. I will do ua and culture to see if you have uti.  For low k. I recommend eat a banana a day and will write low dose potassium tabs for 2 weeks.  For HA and syncope will refer to neurologist you may need mri and EEG. Negative CT of head is good finding. Also your ekg looked ok/good from ED. If you have severe ha or associated signs and symptoms then ED eval. Also if when you have syncope you need to be shortly after that. Investigation at that time may reveal cause.  For cough with wheezing since May will get chest xray. Rx abluterol inhaler  continue spiriva and rx taper prednisone since you don't have funds for symbivort presently. Will rx antibiotic but want to get cxr result first.  Will send message MA of Dr. Etter Sjogren to see how they will handle your potential depression med changes and changes to benzo.   Follow up in 10 days or as needed

## 2015-10-18 NOTE — Addendum Note (Signed)
Addended by: Tasia Catchings on: 10/18/2015 02:01 PM   Modules accepted: Orders

## 2015-10-19 MED FILL — DOXYCYCLINE 100 MG TABLET: 100 | 7 days supply | Qty: 14 | Fill #0

## 2015-10-20 ENCOUNTER — Telehealth: Payer: Self-pay | Admitting: Family Medicine

## 2015-10-20 LAB — URINE CULTURE
Colony Count: NO GROWTH
Organism ID, Bacteria: NO GROWTH

## 2015-10-20 NOTE — Telephone Encounter (Signed)
I discussed with you her request for hydrocodone. At very end she wanted to discuss switching benzodiazepines. If you would see my last note.

## 2015-10-20 NOTE — Telephone Encounter (Signed)
We need to discuss

## 2015-10-20 NOTE — Telephone Encounter (Signed)
°  Relationship to patient: Self  Can be reached: [phone]    Reason for call: Patient request refill on HYDROcodone-acetaminophen (NORCO) 5-325 MG tablet FM:[id number]   Patient also states she needs to know if Dr. Etter Sjogren has agreed to change her medication as discussed with Einar Pheasant

## 2015-10-20 NOTE — Telephone Encounter (Signed)
Ok--- I'm not questioning you -- [name]---- but the PT-----  She nees a psych referral and I know I've discussed this with her on several occasions  [name]--- please make sure she has a list of psych numbers ([name] has them)----  And put referral in system--- I've discussed with pt several x my comfort level with psych meds and controlled subk

## 2015-10-20 NOTE — Telephone Encounter (Signed)
Last OV: 10/18/15 w/ [facility], 08/20/15 w/ PCP Last filled: 10/18/15 by Mackie Pai, PA-C, #12, 0 RF Sig: Take 1 tablet by mouth every 6 (six) hours as needed for moderate pain. UDS: 05/06/15 positive for hydrocodone, benzos, cyclobenzaprine, negative for valium  Please advise regarding below. Pt has not seen [facility], but has seen [facility]. OV note states: "Will send message MA of Dr. Etter Sjogren to see how they will handle your potential depression med changes and changes to benzo" but no phone notes or otherwise addressing this.

## 2015-10-20 NOTE — Telephone Encounter (Signed)
Ok. Thanks!

## 2015-10-20 NOTE — Telephone Encounter (Signed)
Pt called in because she says that she would like to have pcp look into a few of her medications.    She says that she was switched to VALIUM but would like to switch back to klonopin because the valium makes her shake. She says that she is also taking Seroquel, which causes her heart to beat really fast. And lastly Zolof, pt says is no longer helping.   [facility]arm[facility]: [facility]

## 2015-10-20 NOTE — Telephone Encounter (Signed)
Returned patient's call.  Left a message for call back.  Pt needs an appointment.

## 2015-10-21 ENCOUNTER — Encounter: Payer: Self-pay | Admitting: Family Medicine

## 2015-10-21 ENCOUNTER — Other Ambulatory Visit: Payer: Self-pay | Admitting: Family Medicine

## 2015-10-21 ENCOUNTER — Ambulatory Visit (INDEPENDENT_AMBULATORY_CARE_PROVIDER_SITE_OTHER): Payer: BLUE CROSS/BLUE SHIELD | Admitting: Family Medicine

## 2015-10-21 VITALS — BP 101/72 | HR 99 | Temp 98.1°F | Ht 63.5 in | Wt 105.6 lb

## 2015-10-21 DIAGNOSIS — F411 Generalized anxiety disorder: Secondary | ICD-10-CM | POA: Diagnosis not present

## 2015-10-21 DIAGNOSIS — F329 Major depressive disorder, single episode, unspecified: Secondary | ICD-10-CM

## 2015-10-21 DIAGNOSIS — F32A Depression, unspecified: Secondary | ICD-10-CM

## 2015-10-21 DIAGNOSIS — F319 Bipolar disorder, unspecified: Secondary | ICD-10-CM

## 2015-10-21 MED ORDER — ESCITALOPRAM OXALATE 10 MG PO TABS: 10.0000 mg | ORAL_TABLET | Freq: Every day | ORAL | Status: DC

## 2015-10-21 MED ORDER — BREXPIPRAZOLE 1 MG PO TABS: 1.0000 | ORAL_TABLET | ORAL | Status: DC

## 2015-10-21 MED ORDER — LURASIDONE HCL 20 MG PO TABS: ORAL_TABLET | ORAL | Status: DC

## 2015-10-21 MED ORDER — CLONAZEPAM 1 MG PO TABS: 1.0000 mg | ORAL_TABLET | Freq: Two times a day (BID) | ORAL | Status: DC | PRN

## 2015-10-21 MED FILL — LATUDA 20 MG TABLET: 20 | 30 days supply | Qty: 30 | Fill #0

## 2015-10-21 MED FILL — clonazePAM 1 MG TABS: 1 | 30 days supply | Qty: 60 | Fill #0 | Status: TO

## 2015-10-21 MED FILL — ESCITALOPRAM 10 MG TABLET: 10 | 30 days supply | Qty: 30 | Fill #0

## 2015-10-21 NOTE — Progress Notes (Signed)
Pre visit review using our clinic review tool, if applicable. No additional management support is needed unless otherwise documented below in the visit note. 

## 2015-10-21 NOTE — Progress Notes (Signed)
Patient ID: [name], female    DOB: May 28, 1975  Age: 40 y.o. MRN: [id number]    Subjective:  Subjective HPI [name] presents for f/u anxiety / depression. The trazadone/ valium is not working and she feels her depression, anxiety is worsening.  Pt is not suicidal / homicidal  Review of Systems  Constitutional: Negative for diaphoresis, appetite change, fatigue and unexpected weight change.  Eyes: Negative for pain, redness and visual disturbance.  Respiratory: Negative for cough, chest tightness, shortness of breath and wheezing.   Cardiovascular: Negative for chest pain, palpitations and leg swelling.  Endocrine: Negative for cold intolerance, heat intolerance, polydipsia, polyphagia and polyuria.  Genitourinary: Negative for dysuria, frequency and difficulty urinating.  Neurological: Negative for dizziness, light-headedness, numbness and headaches.    History Past Medical History  Diagnosis Date  . 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 SLEEP APNEA 08/31/2007  . 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 DEPRESSION 08/31/2007  . ARTHRITIS 08/31/2007  . HYPERTENSION 08/31/2007  . Chronic interstitial cystitis 03/11/2009  . IBS 03/11/2009  . GERD 02/05/2009  . BRONCHITIS, RECURRENT 08/23/2009    Asthmatic Bronchitis-Dr. Melvyn Novas.....-HFA 75% 12/04/2008>75% 02/05/2009>75% 08/04/2009 -PFT's 01/04/2009 2.56 (86%) ratio 75, no resp to B2 and DLC0 67% > 80 after correction   . Anal fissure 03/11/2009  . RECTAL BLEEDING 03/11/2009  . COLONIC POLYPS, HX OF 07/25/2006    ADENOMATOUS POLYP  . Migraine headache   . Chronic pain   . Chronic nausea   . COPD (chronic obstructive pulmonary disease) ([place])   . Asthma   . Anxiety and depression   . Uterine cyst   . Endometriosis   . Bowel obstruction ([place])   . Colon polyps   . Internal hemorrhoids   . Cancer (HCC)     cervical cancer  . FIBROMYALGIA 08/31/2007  . Arthritis   . Anemia   . Anxiety   .  Hyperlipidemia   . Seizures ([place])     been about 1 year since last seisure per pt  . Bipolar 1 disorder ([place])   . PONV (postoperative nausea and vomiting)   . Thyroid disease     She has past surgical history that includes Cholecystectomy; Abdominal hysterectomy; Pacemaker insertion; Bladder surgery; interstitial cystitis; bladder stretching x6; replaced bladder pacemaker; removal of uterine cyst and scrapped uterus; Colonoscopy; and Colonoscopy with propofol (N/A, 09/03/2014).   Her family history includes Asthma in her maternal grandmother; Cancer in her maternal grandfather and other; Emphysema in her maternal grandfather; Heart disease in her father. There is no history of Colon cancer, Esophageal cancer, Rectal cancer, Stomach cancer, or Thyroid disease.She reports that she quit smoking about 3 months ago. Her smoking use included Cigarettes. She has a 7 pack-year smoking history. She has never used smokeless tobacco. She reports that she drinks alcohol. She reports that she uses illicit drugs (Marijuana).  Current Outpatient Prescriptions on File Prior to Visit  Medication Sig Dispense Refill  . albuterol (PROVENTIL HFA;VENTOLIN HFA) 108 (90 Base) MCG/ACT inhaler Inhale 2 puffs into the lungs every 6 (six) hours as needed for wheezing or shortness of breath. 1 Inhaler 0  . cetirizine (ZYRTEC) 10 MG tablet Take 10 mg by mouth at bedtime as needed for allergies. Reported on 03/31/2015    . Cyanocobalamin (VITAMIN B 12 PO) Take 1 tablet by mouth every evening.    . cyclobenzaprine (FLEXERIL) 10 MG tablet Take 1 tablet (10 mg total) by mouth 2 (two) times daily as needed  for muscle spasms. 7 tablet 0  . dexlansoprazole (DEXILANT) 60 MG capsule Take 1 capsule (60 mg total) by mouth daily. 30 capsule 11  . doxazosin (CARDURA) 2 MG tablet TAKE ONE TABLET BY MOUTH AT BEDTIME 30 tablet 5  . HYDROcodone-acetaminophen (NORCO) 5-325 MG tablet Take 1 tablet by mouth every 6 (six) hours as needed for  moderate pain. 12 tablet 0  . hydrOXYzine (ATARAX/VISTARIL) 25 MG tablet Take 1 tablet by mouth daily.  4  . hyoscyamine (LEVSIN, ANASPAZ) 0.125 MG tablet Take 0.125 mg by mouth every 4 (four) hours as needed for cramping.    . methimazole (TAPAZOLE) 5 MG tablet TAKE ONE TABLET BY MOUTH THREE TIMES A WEEK. **PT NEEDS APPT WITH DR ELLISON** 13 tablet 1  . ondansetron (ZOFRAN) 8 MG tablet Take 1 tablet (8 mg total) by mouth every 8 (eight) hours as needed for nausea or vomiting. 4 tablet 0  . potassium chloride (K-DUR) 10 MEQ tablet Take 1 tablet (10 mEq total) by mouth daily. 14 tablet 0  . prochlorperazine (COMPAZINE) 10 MG tablet Take 10 mg by mouth every 6 (six) hours as needed for nausea or vomiting.     . promethazine (PHENERGAN) 25 MG suppository Place 1 suppository (25 mg total) rectally every 6 (six) hours as needed for nausea or vomiting. 3 each 0  . ranitidine (ZANTAC) 150 MG tablet Take 1 tablet (150 mg total) by mouth 2 (two) times daily. 60 tablet 2  . SYMBICORT 160-4.5 MCG/ACT inhaler INHALE TWO PUFFS BY MOUTH TWICE DAILY 1 Inhaler 5  . Tetrahydrozoline HCl (VISINE OP) Apply 2 drops to eye 2 (two) times daily as needed (dry eyes).    Marland Kitchen tiotropium (SPIRIVA HANDIHALER) 18 MCG inhalation capsule 1 cap inhaled qd 30 capsule 12  . VENTOLIN HFA 108 (90 BASE) MCG/ACT inhaler INHALE TWO PUFFS BY MOUTH EVERY 4 HOURS AS NEEDED FOR WHEEZING OR SHORTNESS OF BREATH 18 each 5  . doxycycline (VIBRA-TABS) 100 MG tablet Take 1 tablet (100 mg total) by mouth 2 (two) times daily. (Patient not taking: Reported on 10/21/2015) 14 tablet 0  . [DISCONTINUED] amitriptyline (ELAVIL) 25 MG tablet 2 tablets by mouth at bedtime     . [DISCONTINUED] clidinium-chlordiazePOXIDE (LIBRAX) 2.5-5 MG per capsule 2 capsules by mouth every morning and 1 at bedtime      No current facility-administered medications on file prior to visit.     Objective:  Objective Physical Exam  Constitutional: She is oriented to person,  place, and time. She appears well-developed and well-nourished.  HENT:  Head: Normocephalic and atraumatic.  Eyes: Conjunctivae and EOM are normal.  Neck: Normal range of motion. Neck supple. No JVD present. Carotid bruit is not present. No thyromegaly present.  Cardiovascular: Normal rate, regular rhythm and normal heart sounds.   No murmur heard. Pulmonary/Chest: Effort normal and breath sounds normal. No respiratory distress. She has no wheezes. She has no rales. She exhibits no tenderness.  Musculoskeletal: She exhibits no edema.  Neurological: She is alert and oriented to person, place, and time.  Psychiatric: Her behavior is normal. Her mood appears anxious. Thought content is not paranoid. She exhibits a depressed mood. She expresses no homicidal and no suicidal ideation. She expresses no suicidal plans and no homicidal plans.  Nursing note and vitals reviewed.  BP 101/72 mmHg  Pulse 99  Temp(Src) 98.1 F (36.7 C) (Oral)  Ht 5' 3.5" (1.613 m)  Wt 105 lb 9.6 oz (47.9 kg)  BMI 18.41 kg/m2  SpO2 98% Wt Readings from Last 3 Encounters:  10/21/15 105 lb 9.6 oz (47.9 kg)  10/18/15 108 lb (48.988 kg)  10/16/15 105 lb (47.628 kg)     Lab Results  Component Value Date   WBC 12.1* 10/16/2015   HGB 14.2 10/16/2015   HCT 40.3 10/16/2015   PLT 357 10/16/2015   GLUCOSE 109* 10/16/2015   CHOL 127 07/19/2009   TRIG 130.0 07/19/2009   HDL 42.80 07/19/2009   LDLCALC 58 07/19/2009   ALT 8 06/03/2015   AST 15 06/03/2015   NA 139 10/16/2015   K 3.2* 10/16/2015   CL 106 10/16/2015   CREATININE 0.78 10/16/2015   BUN 6 10/16/2015   CO2 24 10/16/2015   TSH 1.39 06/03/2015   HGBA1C 4.9 08/10/2014    Dg Chest 2 View  10/18/2015  CLINICAL DATA:  Two months of cough with productive component for the past 2 weeks associated with dizziness headache and shortness of breath with syncopal episode; history of asthma, COPD, former smoker. EXAM: CHEST  2 VIEW COMPARISON:  PA and lateral chest  x-ray of June 07, 2015 FINDINGS: The lungs are hyperinflated but clear. The heart and pulmonary vascularity are normal. The mediastinum is normal in width. There is no pleural effusion or pneumothorax. The bony thorax is unremarkable. IMPRESSION: Asthma-COPD with hyperinflation. No pneumonia nor other acute cardiopulmonary abnormality. Electronically Signed   By: David  Martinique M.D.   On: 10/18/2015 14:35     Assessment & Plan:  Plan I have discontinued Ms. Gotto's potassium chloride SA, QUEtiapine, sertraline, traZODone, diazepam, predniSONE, and Brexpiprazole. I am also having her start on clonazePAM, escitalopram, and lurasidone. Additionally, I am having her maintain her cetirizine, prochlorperazine, Cyanocobalamin (VITAMIN B 12 PO), hyoscyamine, Tetrahydrozoline HCl (VISINE OP), ranitidine, SYMBICORT, VENTOLIN HFA, doxazosin, cyclobenzaprine, promethazine, dexlansoprazole, tiotropium, methimazole, hydrOXYzine, ondansetron, potassium chloride, albuterol, HYDROcodone-acetaminophen, and doxycycline.  Meds ordered this encounter  Medications  . clonazePAM (KLONOPIN) 1 MG tablet    Sig: Take 1 tablet (1 mg total) by mouth 2 (two) times daily as needed for anxiety.    Dispense:  60 tablet    Refill:  1  . escitalopram (LEXAPRO) 10 MG tablet    Sig: Take 1 tablet (10 mg total) by mouth daily.    Dispense:  30 tablet    Refill:  5  . DISCONTD: Brexpiprazole (REXULTI) 1 MG TABS    Sig: Take 1 tablet by mouth 30 (thirty) minutes before procedure.    Dispense:  30 tablet    Refill:  2  . lurasidone (LATUDA) 20 MG TABS tablet    Sig: 1 po qd    Dispense:  30 tablet    Refill:  2    Problem List Items Addressed This Visit    None    Visit Diagnoses    Generalized anxiety disorder    -  Primary    Relevant Medications    clonazePAM (KLONOPIN) 1 MG tablet    escitalopram (LEXAPRO) 10 MG tablet    Depression        Relevant Medications    escitalopram (LEXAPRO) 10 MG tablet    Bipolar I  disorder (HCC)        Relevant Medications    lurasidone (LATUDA) 20 MG TABS tablet       Follow-up: Return in about 4 weeks (around 11/18/2015), or if symptoms worsen or fail to improve.  [name], DO

## 2015-10-21 NOTE — Telephone Encounter (Signed)
Called to follow up with patient this morning.  Pt states she does not want psych referral.  States she's not comfortable seeing a therapist.  Explained to patient that her provider feels it's best for her given her situation and need for medication management.  Pt again declined asking that Dr. Etter Sjogren only change her Valium back to Klonopin. Pt has an appt scheduled today with Dr. Etter Sjogren at 2:45pm.    Please advise.

## 2015-10-21 NOTE — Telephone Encounter (Signed)
Pt came in for appt today.  Below discussed with provider.

## 2015-10-21 NOTE — Patient Instructions (Signed)
Major Depressive Disorder Major depressive disorder is a mental illness. It also may be called clinical depression or unipolar depression. Major depressive disorder usually causes feelings of sadness, hopelessness, or helplessness. Some people with this disorder do not feel particularly sad but lose interest in doing things they used to enjoy (anhedonia). Major depressive disorder also can cause physical symptoms. It can interfere with work, school, relationships, and other normal everyday activities. The disorder varies in severity but is longer lasting and more serious than the sadness we all feel from time to time in our lives. Major depressive disorder often is triggered by stressful life events or major life changes. Examples of these triggers include divorce, loss of your job or home, a move, and the death of a family member or close friend. Sometimes this disorder occurs for no obvious reason at all. People who have family members with major depressive disorder or bipolar disorder are at higher risk for developing this disorder, with or without life stressors. Major depressive disorder can occur at any age. It may occur just once in your life (single episode major depressive disorder). It may occur multiple times (recurrent major depressive disorder). SYMPTOMS People with major depressive disorder have either anhedonia or depressed mood on nearly a daily basis for at least 2 weeks or longer. Symptoms of depressed mood include:  Feelings of sadness (blue or down in the dumps) or emptiness.  Feelings of hopelessness or helplessness.  Tearfulness or episodes of crying (may be observed by others).  Irritability (children and adolescents). In addition to depressed mood or anhedonia or both, people with this disorder have at least four of the following symptoms:  Difficulty sleeping or sleeping too much.   Significant change (increase or decrease) in appetite or weight.   Lack of energy or  motivation.  Feelings of guilt and worthlessness.   Difficulty concentrating, remembering, or making decisions.  Unusually slow movement (psychomotor retardation) or restlessness (as observed by others).   Recurrent wishes for death, recurrent thoughts of self-harm (suicide), or a suicide attempt. People with major depressive disorder commonly have persistent negative thoughts about themselves, other people, and the world. People with severe major depressive disorder may experiencedistorted beliefs or perceptions about the world (psychotic delusions). They also may see or hear things that are not real (psychotic hallucinations). DIAGNOSIS Major depressive disorder is diagnosed through an assessment by your health care provider. Your health care provider will ask aboutaspects of your daily life, such as mood,sleep, and appetite, to see if you have the diagnostic symptoms of major depressive disorder. Your health care provider may ask about your medical history and use of alcohol or drugs, including prescription medicines. Your health care provider also may do a physical exam and blood work. This is because certain medical conditions and the use of certain substances can cause major depressive disorder-like symptoms (secondary depression). Your health care provider also may refer you to a mental health specialist for further evaluation and treatment. TREATMENT It is important to recognize the symptoms of major depressive disorder and seek treatment. The following treatments can be prescribed for this disorder:   Medicine. Antidepressant medicines usually are prescribed. Antidepressant medicines are thought to correct chemical imbalances in the brain that are commonly associated with major depressive disorder. Other types of medicine may be added if the symptoms do not respond to antidepressant medicines alone or if psychotic delusions or hallucinations occur.  Talk therapy. Talk therapy can be  helpful in treating major depressive disorder by providing   support, education, and guidance. Certain types of talk therapy also can help with negative thinking (cognitive behavioral therapy) and with relationship issues that trigger this disorder (interpersonal therapy). A mental health specialist can help determine which treatment is best for you. Most people with major depressive disorder do well with a combination of medicine and talk therapy. Treatments involving electrical stimulation of the brain can be used in situations with extremely severe symptoms or when medicine and talk therapy do not work over time. These treatments include electroconvulsive therapy, transcranial magnetic stimulation, and vagal nerve stimulation.   This information is not intended to replace advice given to you by your health care provider. Make sure you discuss any questions you have with your health care provider.   Document Released: 07/22/2012 Document Revised: 04/17/2014 Document Reviewed: 07/22/2012 Elsevier Interactive Patient Education 2016 [employer]  

## 2015-10-22 DIAGNOSIS — R102 Pelvic and perineal pain: Secondary | ICD-10-CM | POA: Diagnosis not present

## 2015-10-22 DIAGNOSIS — N3010 Interstitial cystitis (chronic) without hematuria: Secondary | ICD-10-CM | POA: Diagnosis not present

## 2015-10-22 DIAGNOSIS — G8929 Other chronic pain: Secondary | ICD-10-CM | POA: Diagnosis not present

## 2015-10-22 DIAGNOSIS — F17210 Nicotine dependence, cigarettes, uncomplicated: Secondary | ICD-10-CM | POA: Diagnosis not present

## 2015-10-22 DIAGNOSIS — Z8249 Family history of ischemic heart disease and other diseases of the circulatory system: Secondary | ICD-10-CM | POA: Diagnosis not present

## 2015-10-22 NOTE — Telephone Encounter (Signed)
Last seen 10/21/15, RX no longer on the medication list. Please advise     KP

## 2015-10-25 ENCOUNTER — Telehealth: Payer: Self-pay | Admitting: Medical

## 2015-10-25 NOTE — Telephone Encounter (Signed)
Opened to review and make addendum

## 2015-10-27 NOTE — Telephone Encounter (Signed)
Complete

## 2015-10-28 ENCOUNTER — Telehealth: Payer: Self-pay | Admitting: Medical

## 2015-10-28 ENCOUNTER — Ambulatory Visit (INDEPENDENT_AMBULATORY_CARE_PROVIDER_SITE_OTHER): Payer: BLUE CROSS/BLUE SHIELD | Admitting: Medical

## 2015-10-28 ENCOUNTER — Encounter: Payer: Self-pay | Admitting: Medical

## 2015-10-28 VITALS — BP 112/73 | HR 95 | Temp 98.3°F | Ht 64.0 in | Wt 107.2 lb

## 2015-10-28 DIAGNOSIS — R55 Syncope and collapse: Secondary | ICD-10-CM | POA: Diagnosis not present

## 2015-10-28 DIAGNOSIS — S0093XA Contusion of unspecified part of head, initial encounter: Secondary | ICD-10-CM | POA: Diagnosis not present

## 2015-10-28 DIAGNOSIS — M545 Low back pain, unspecified: Secondary | ICD-10-CM

## 2015-10-28 MED ORDER — HYDROXYZINE HCL 25 MG PO TABS: 25.0000 mg | ORAL_TABLET | Freq: Three times a day (TID) | ORAL | Status: DC | PRN

## 2015-10-28 NOTE — Telephone Encounter (Signed)
Pt states her urologist was prescribing her norco 10/325(for intersitial cystitis, sciatica, back pain and fibromyalgia). She was taking up to [facility]. They no longer will write. I made patient aware I would not write. She wants pcp to write the rx.

## 2015-10-28 NOTE — Telephone Encounter (Signed)
I spoke with pt and she voices understanding and she would like a phone call when the referral is placed and where she will be sent. Pt states that her cell phone is the best contact number to reach her at when the location is determined after the referral is completed. The best contact number is [phone]. Please advise.

## 2015-10-28 NOTE — Telephone Encounter (Signed)
I've already discussed this with her--- we will not write it We can refer her to pain management

## 2015-10-28 NOTE — Telephone Encounter (Signed)
Please notify pt that Dr. Etter Sjogren won't rx pain meds. I put in referral to pain management.

## 2015-10-28 NOTE — Patient Instructions (Addendum)
For your back pain, IC, fibromyalgia and sciatica. I will send Dr. Etter Sjogren a note and inform her that you want her to rx norco. If she declines then will refer to pain management.  For your psychiatry referral. I want you to go ahead and fill out the patient request form and make copy of filled out form. We can send it out but I want to confirm name of  the Dr. And phone number. Please give [place] that number so we can make sure that psychiatrist is a female since you don't want to see female provider.Ask staff at check out how to fill out form.  For your rt forearm  dog bite. I did not see puncture wounds so I don't thinks we have to worry about skin infection. But if area gets red, warm or tender then notify us and would rx antibiotic.  For chigger bites I will rx hydroxyzine for itching. You could also use otc hyrocortsone for areas that itch a lot.  For your head contusion and loc for 2 hours on Saturday will try to get CT of head stat today.  Follow up in 1-2 weeks with Dr. Etter Sjogren or as needed

## 2015-10-28 NOTE — Progress Notes (Signed)
Subjective:    Patient ID: [name], female    DOB: 1975/10/22, 40 y.o.   MRN: [id number]  HPI  Pt in for follow up.   I had asked for information on surgeon who put in stimulator. Pt states this is not helping her back pain. So she states she will have it taken out. Pt had stimulator replaced four times. I talked with Dr. [name] on last visit. Explained I had given brief course of narcotics. Dr. [name] explained pt hx and advised against further rx of pain meds. Pt states urologist(Dr. Graylin Shiver) was giving pain medicine. For IC, sciatica, fibromyalgia and back pain. Urologist office is now saying she needs to see pcp or pain management clinic. Pt does not ant to see pain clnic. She is using norco 10/325  Up to tid.  Pt states psychiatrist did call pt and she is supposed to fill sheets out. Fill out ant send to Dr. Neva Seat. Pt has bipolar.(pt refuses to see a man)  Pt states recently her dog grabbed her forearm. Pt sates no breaking of skin. Pt dog up to date on rabies vaccine. Pt up to date on tetnaus.  Pt states the other day she tripped on a stick at her house. She rolled down a hill. She states head may hit a log. Pt son found her about 2 hours later. Faint soreness at back of her head. No gross motor or sensroy function deficits. Pt states some chigger bites on legs and stomach.      Review of Systems  Constitutional: Negative for chills and fatigue.  Respiratory: Negative for cough, chest tightness, shortness of breath and wheezing.   Cardiovascular: Negative for chest pain and palpitations.  Gastrointestinal: Negative for nausea, abdominal pain, diarrhea and abdominal distention.  Genitourinary:       IC history.   Musculoskeletal: Positive for back pain.  Skin:       Chigger bites maybe dog bit to rt forearm.  Neurological: Negative for dizziness, syncope, weakness and headaches.       Fibromyalgia history.  Hematological: Negative for adenopathy. Does not bruise/bleed  easily.  Psychiatric/Behavioral: Positive for dysphoric mood. Negative for suicidal ideas, behavioral problems, confusion, sleep disturbance and self-injury. The patient is nervous/anxious.     Past Medical History  Diagnosis Date  . 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 SLEEP APNEA 08/31/2007  . 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 DEPRESSION 08/31/2007  . ARTHRITIS 08/31/2007  . HYPERTENSION 08/31/2007  . Chronic interstitial cystitis 03/11/2009  . IBS 03/11/2009  . GERD 02/05/2009  . BRONCHITIS, RECURRENT 08/23/2009    Asthmatic Bronchitis-Dr. Sherene Sires.....-HFA 75% 12/04/2008>75% 02/05/2009>75% 08/04/2009 -PFT's 01/04/2009 2.56 (86%) ratio 75, no resp to B2 and DLC0 67% > 80 after correction   . Anal fissure 03/11/2009  . RECTAL BLEEDING 03/11/2009  . COLONIC POLYPS, HX OF 07/25/2006    ADENOMATOUS POLYP  . Migraine headache   . Chronic pain   . Chronic nausea   . COPD (chronic obstructive pulmonary disease) (HCC)   . Asthma   . Anxiety and depression   . Uterine cyst   . Endometriosis   . Bowel obstruction (HCC)   . Colon polyps   . Internal hemorrhoids   . Cancer (HCC)     cervical cancer  . FIBROMYALGIA 08/31/2007  . Arthritis   . Anemia   . Anxiety   . Hyperlipidemia   . Seizures (HCC)     been about 1 year since  last seisure per pt  . Bipolar 1 disorder (HCC)   . PONV (postoperative nausea and vomiting)   . Thyroid disease      Social History   Social History  . Marital Status: Married    Spouse Name: N/A  . Number of Children: 2  . Years of Education: N/A   Occupational History  .     Social History Main Topics  . Smoking status: Current Every Day Smoker -- 0.50 packs/day for 14 years    Types: Cigarettes  . Smokeless tobacco: Never Used     Comment: trying to quit  . Alcohol Use: 0.0 oz/week    0 Standard drinks or equivalent per week     Comment: rare wine   . Drug Use: Yes    Special: Marijuana     Comment: occasional,  pt states today 06/19/12 no drug use  . Sexual Activity: Not on file   Other Topics Concern  . Not on file   Social History Narrative   Homemaker   Daily Caffeine Use-Mtn. Dew          Past Surgical History  Procedure Laterality Date  . Cholecystectomy    . Abdominal hysterectomy    . Pacemaker insertion      in hip for interstitial cystitis  . Bladder surgery      stimulator placed and stretching   . Interstitial cystitis    . Bladder stretching x6    . Replaced bladder pacemaker    . Removal of uterine cyst and scrapped uterus    . Colonoscopy    . Colonoscopy with propofol N/A 09/03/2014    Procedure: COLONOSCOPY WITH PROPOFOL;  Surgeon: Rachael Fee, MD;  Location: [facility];  Service: Endoscopy;  Laterality: N/A;    Family History  Problem Relation Age of Onset  . Heart disease Father   . Asthma Maternal Grandmother   . Emphysema Maternal Grandfather   . Cancer Maternal Grandfather     Lung Cancer  . Cancer Other     Lung Cancer-Aunt  . Colon cancer Neg Hx   . Esophageal cancer Neg Hx   . Rectal cancer Neg Hx   . Stomach cancer Neg Hx   . Thyroid disease Neg Hx     Allergies  Allergen Reactions  . Abilify [Aripiprazole] Swelling, Other (See Comments) and Palpitations    tremors Throat swelling, tremors  . Metoclopramide Hcl Other (See Comments)    Causes seizures  . Propoxyphene Rash  . Tramadol Swelling, Other (See Comments) and Rash    Throat swelling, tremors  . Ambien [Zolpidem Tartrate] Other (See Comments)    hallucinations  . Eszopiclone Other (See Comments)    Hallucinations, hyper, bad taste in mouth   . Amitriptyline     Other reaction(s): Angioedema (ALLERGY/intolerance)  . Metoclopramide     Other reaction(s): Other (See Comments) Seizures  . Other Swelling    Acetaminophen #3 Acetaminophen #3  . Varenicline Other (See Comments)    Suicidal thoughts  . Buprenorphine Hcl Itching and Hives  . Demerol [Meperidine] Rash  .  Emetrol Itching, Rash and Hives    Other reaction(s): Rash (ALLERGY/intolerance)  . Morphine And Related Hives and Itching  . Propoxyphene N-Acetaminophen Hives, Itching and Rash    Other reaction(s): Rash (ALLERGY/intolerance)    Current Outpatient Prescriptions on File Prior to Visit  Medication Sig Dispense Refill  . albuterol (PROVENTIL HFA;VENTOLIN HFA) 108 (90 Base) MCG/ACT inhaler Inhale 2 puffs into the  lungs every 6 (six) hours as needed for wheezing or shortness of breath. 1 Inhaler 0  . cetirizine (ZYRTEC) 10 MG tablet Take 10 mg by mouth at bedtime as needed for allergies. Reported on 03/31/2015    . clonazePAM (KLONOPIN) 1 MG tablet Take 1 tablet (1 mg total) by mouth 2 (two) times daily as needed for anxiety. 60 tablet 1  . Cyanocobalamin (VITAMIN B 12 PO) Take 1 tablet by mouth every evening.    . cyclobenzaprine (FLEXERIL) 10 MG tablet Take 1 tablet (10 mg total) by mouth 2 (two) times daily as needed for muscle spasms. 7 tablet 0  . dexlansoprazole (DEXILANT) 60 MG capsule Take 1 capsule (60 mg total) by mouth daily. 30 capsule 11  . doxazosin (CARDURA) 2 MG tablet TAKE ONE TABLET BY MOUTH AT BEDTIME 30 tablet 5  . doxycycline (VIBRA-TABS) 100 MG tablet Take 1 tablet (100 mg total) by mouth 2 (two) times daily. 14 tablet 0  . escitalopram (LEXAPRO) 10 MG tablet Take 1 tablet (10 mg total) by mouth daily. 30 tablet 5  . HYDROcodone-acetaminophen (NORCO) 5-325 MG tablet Take 1 tablet by mouth every 6 (six) hours as needed for moderate pain. 12 tablet 0  . hydrOXYzine (ATARAX/VISTARIL) 25 MG tablet Take 1 tablet by mouth daily.  4  . hyoscyamine (LEVSIN, ANASPAZ) 0.125 MG tablet Take 0.125 mg by mouth every 4 (four) hours as needed for cramping.    . lurasidone (LATUDA) 20 MG TABS tablet 1 po qd 30 tablet 2  . methimazole (TAPAZOLE) 5 MG tablet TAKE ONE TABLET BY MOUTH THREE TIMES A WEEK. **PT NEEDS APPT WITH DR ELLISON** 13 tablet 1  . ondansetron (ZOFRAN) 8 MG tablet Take 1  tablet (8 mg total) by mouth every 8 (eight) hours as needed for nausea or vomiting. 4 tablet 0  . potassium chloride (K-DUR) 10 MEQ tablet Take 1 tablet (10 mEq total) by mouth daily. 14 tablet 0  . prochlorperazine (COMPAZINE) 10 MG tablet Take 10 mg by mouth every 6 (six) hours as needed for nausea or vomiting.     . promethazine (PHENERGAN) 25 MG suppository Place 1 suppository (25 mg total) rectally every 6 (six) hours as needed for nausea or vomiting. 3 each 0  . ranitidine (ZANTAC) 150 MG tablet Take 1 tablet (150 mg total) by mouth 2 (two) times daily. 60 tablet 2  . SYMBICORT 160-4.5 MCG/ACT inhaler INHALE TWO PUFFS BY MOUTH TWICE DAILY 1 Inhaler 5  . Tetrahydrozoline HCl (VISINE OP) Apply 2 drops to eye 2 (two) times daily as needed (dry eyes).    Marland Kitchen tiotropium (SPIRIVA HANDIHALER) 18 MCG inhalation capsule 1 cap inhaled qd 30 capsule 12  . VENTOLIN HFA 108 (90 BASE) MCG/ACT inhaler INHALE TWO PUFFS BY MOUTH EVERY 4 HOURS AS NEEDED FOR WHEEZING OR SHORTNESS OF BREATH 18 each 5  . [DISCONTINUED] amitriptyline (ELAVIL) 25 MG tablet 2 tablets by mouth at bedtime     . [DISCONTINUED] clidinium-chlordiazePOXIDE (LIBRAX) 2.5-5 MG per capsule 2 capsules by mouth every morning and 1 at bedtime      No current facility-administered medications on file prior to visit.    BP 112/73 mmHg  Pulse 95  Temp(Src) 98.3 F (36.8 C) (Oral)  Ht 5\' 4"  (1.626 m)  Wt 107 lb 3.2 oz (48.626 kg)  BMI 18.39 kg/m2  SpO2 97%       Objective:   Physical Exam   General Mental Status- Alert. General Appearance- Not in acute distress.  Skin General: Color- Normal Color. Moisture- Normal Moisture.  Neck Carotid Arteries- Normal color. Moisture- Normal Moisture. No carotid bruits. No JVD.  Chest and Lung Exam Auscultation: Breath Sounds:-Normal.  Cardiovascular Auscultation:Rythm- Regular. Murmurs & Other Heart Sounds:Auscultation of the heart reveals- No  Murmurs.  Abdomen Inspection:-Inspeection Normal. Palpation/Percussion:Note:No mass. Palpation and Percussion of the abdomen reveal- Non Tender, Non Distended + BS, no rebound or guarding.    Neurologic Cranial Nerve exam:- CN III-XII intact(No nystagmus), symmetric smile. Drift Test:- No drift. Heal to Toe Gait exam:-Normal. Finger to Nose:- Normal/Intact Strength:- 5/5 equal and symmetric strength both upper and lower extremities.   Skin- on review of her lower legs.No lesions/chigger bites.  Rt forearm- where dog bit. No bite marks. Or break of skin.  Back- mid lumbar pain on lying supine and changing positions.      Assessment & Plan:  For your back pain, IC, fibromyalgia and sciatica. I will send Dr. [name] a note and inform her that you want her to rx norco. If she declines then will refer to pain management.  For your psychiatry referral. I want you to go ahead and fill out the patient request form and make copy of filled out form. We can send it out but I want to confirm name the of the Dr. And phone number. Please give [place] that number so we can make sure that psychiatrist is a female since you don't want to see female provider. Ask staff at check out how to fill out form.  For your rt forearm  dog bite. I did not see puncture wounds so I don't thinks we have to worry about skin infection. But if area gets red, warm or tender then notify us and would rx antibiotic.  For chigger bites I will rx hydroxyzine for itching. You could also use otc hyrocortsone for areas that itch a lot.  For your head contusion and loc for 2 hours on Saturday will try to get CT of head stat today.  Follow up in 1-2 weeks with Dr. [name] or as needed    Nalina Yeatman, Ramon Dredge, [place]

## 2015-10-29 ENCOUNTER — Telehealth: Payer: Self-pay | Admitting: Medical

## 2015-10-29 NOTE — Telephone Encounter (Signed)
Pt declined ct of head due to cost. She states she will let us know if she will get study done.

## 2015-10-29 NOTE — Telephone Encounter (Signed)
Spoke with patient, referral faxed to [facility], aware it may take 4-6 wks for appt

## 2015-11-01 ENCOUNTER — Other Ambulatory Visit: Payer: Self-pay | Admitting: Medical

## 2015-11-08 ENCOUNTER — Encounter: Payer: Self-pay | Admitting: Family Medicine

## 2015-11-08 ENCOUNTER — Other Ambulatory Visit: Payer: Self-pay | Admitting: Family Medicine

## 2015-11-08 DIAGNOSIS — F319 Bipolar disorder, unspecified: Secondary | ICD-10-CM

## 2015-11-08 MED ORDER — ESCITALOPRAM OXALATE 20 MG PO TABS: 20.0000 mg | ORAL_TABLET | Freq: Every day | ORAL | 5 refills | Status: DC

## 2015-11-08 MED ORDER — LURASIDONE HCL 40 MG PO TABS: 40.0000 mg | ORAL_TABLET | Freq: Every day | ORAL | 2 refills | Status: DC

## 2015-11-08 NOTE — Telephone Encounter (Signed)
Spoke with patient she stated that she was put on Lexapro 10 mg  and Latuda 20 mg, ans they are working ok, but she said she is doing 2 of both of them but she only has a 30 day supply, she wanted to know if we can increase in the quantity. Please advise     KP

## 2015-11-09 ENCOUNTER — Encounter: Payer: Self-pay | Admitting: Medical

## 2015-11-09 MED FILL — LATUDA 40 MG TABLET: 40 | 30 days supply | Qty: 30 | Fill #0

## 2015-11-09 MED FILL — ESCITALOPRAM 20 MG TABLET: 20 | 30 days supply | Qty: 30 | Fill #0

## 2015-11-10 DIAGNOSIS — F17210 Nicotine dependence, cigarettes, uncomplicated: Secondary | ICD-10-CM | POA: Diagnosis not present

## 2015-11-10 DIAGNOSIS — R339 Retention of urine, unspecified: Secondary | ICD-10-CM | POA: Diagnosis not present

## 2015-11-10 DIAGNOSIS — Z79899 Other long term (current) drug therapy: Secondary | ICD-10-CM | POA: Diagnosis not present

## 2015-11-10 DIAGNOSIS — M545 Low back pain: Secondary | ICD-10-CM | POA: Diagnosis not present

## 2015-11-10 DIAGNOSIS — N3010 Interstitial cystitis (chronic) without hematuria: Secondary | ICD-10-CM | POA: Diagnosis not present

## 2015-11-10 DIAGNOSIS — J449 Chronic obstructive pulmonary disease, unspecified: Secondary | ICD-10-CM | POA: Diagnosis not present

## 2015-11-10 DIAGNOSIS — M549 Dorsalgia, unspecified: Secondary | ICD-10-CM | POA: Diagnosis not present

## 2015-11-11 ENCOUNTER — Ambulatory Visit: Payer: BLUE CROSS/BLUE SHIELD | Admitting: Neurology

## 2015-11-11 DIAGNOSIS — Z029 Encounter for administrative examinations, unspecified: Secondary | ICD-10-CM

## 2015-11-12 MED FILL — POTASSIUM CL 10 MEQ TAB SA: 10 | 14 days supply | Qty: 14 | Fill #0

## 2015-11-21 ENCOUNTER — Other Ambulatory Visit: Payer: Self-pay | Admitting: Medical

## 2015-11-22 ENCOUNTER — Ambulatory Visit: Payer: BLUE CROSS/BLUE SHIELD | Admitting: Family Medicine

## 2015-11-22 DIAGNOSIS — Z0289 Encounter for other administrative examinations: Secondary | ICD-10-CM

## 2015-11-26 ENCOUNTER — Encounter: Payer: Self-pay | Admitting: Family Medicine

## 2015-12-20 ENCOUNTER — Other Ambulatory Visit: Payer: Self-pay | Admitting: Family Medicine

## 2015-12-20 DIAGNOSIS — F411 Generalized anxiety disorder: Secondary | ICD-10-CM

## 2015-12-20 NOTE — Telephone Encounter (Signed)
Last seen and filled 10/21/15 #60 with 1 rf   Please advise    KP

## 2015-12-27 ENCOUNTER — Telehealth: Payer: Self-pay | Admitting: Family Medicine

## 2015-12-27 ENCOUNTER — Telehealth: Payer: Self-pay | Admitting: Medical

## 2015-12-27 NOTE — Telephone Encounter (Signed)
Relation to pt: self  Call back number: [phone] Pharmacy:  Reason for call:  Patient requesting pain medication for lower back pain, patient states she cant afford ultrasound at this time. Please advise

## 2015-12-27 NOTE — Telephone Encounter (Addendum)
[name] or [name],  Appears pt was wanting to know when her pain management appointment is. Looking through chart it looks like referral placed in July. Will you let me and Dr. Etter Sjogren know when is date of appointment. Thanks.

## 2015-12-27 NOTE — Telephone Encounter (Signed)
Dr. Etter Sjogren. I think referral  was placed in July on review of chart. I sent message to [place] and Anderson Malta to investigate and let us know.  Percell Miller

## 2015-12-27 NOTE — Telephone Encounter (Signed)
When is her pain management appointment?

## 2015-12-27 NOTE — Telephone Encounter (Signed)
Patient checking on the status of message below °

## 2015-12-28 NOTE — Telephone Encounter (Signed)
[facility] accepted patient, patient has not returned their calls to schedule.

## 2015-12-28 NOTE — Telephone Encounter (Signed)
Pt needs to call -- [facility]--- see jen's note

## 2016-01-04 ENCOUNTER — Other Ambulatory Visit: Payer: Self-pay | Admitting: Medical

## 2016-01-04 NOTE — Telephone Encounter (Signed)
Pt wants refill of hydroxyzine. I had written it for her in the past. What is going on presently? Does she have itching. I noticed she is also on zyrtec on chart. Which is same class of medicine. Need to know what is going on before filling.

## 2016-01-05 MED FILL — LATUDA 40 MG TABLET: 40 | 30 days supply | Qty: 30 | Fill #1

## 2016-01-05 MED FILL — ESCITALOPRAM 20 MG TABLET: 20 | 30 days supply | Qty: 30 | Fill #1

## 2016-01-05 NOTE — Telephone Encounter (Signed)
Called to follow up with patient.  Phone rang. No answering service.  Will try again tomorrow.

## 2016-01-13 ENCOUNTER — Other Ambulatory Visit: Payer: Self-pay | Admitting: Family Medicine

## 2016-01-13 DIAGNOSIS — F411 Generalized anxiety disorder: Secondary | ICD-10-CM

## 2016-01-13 NOTE — Telephone Encounter (Signed)
She need to see psych

## 2016-01-13 NOTE — Telephone Encounter (Signed)
Last seen 10/21/15 and filled 12/20/2015 #60   Please advise   KP

## 2016-01-17 ENCOUNTER — Telehealth: Payer: Self-pay | Admitting: Family Medicine

## 2016-01-17 NOTE — Telephone Encounter (Signed)
Patient has been made aware the Rx has been denied because Dr.Lowne is requesting she follow up with Psych, she said it will take over a month to get an apt and she wanted to get a script until she can get in, she has agreed to call and get it scheduled. Routing to PCP. Please advise    KP

## 2016-01-17 NOTE — Telephone Encounter (Signed)
Pt called in to check the status of her refill request.    CB: # [phone]

## 2016-01-17 NOTE — Telephone Encounter (Signed)
Patient is requesting a refill of clonazePAM (KLONOPIN) 1 MG tablet  Patient Relation: Self Patient phone: [phone] Pharmacy: [facility] [address], [place]

## 2016-01-18 ENCOUNTER — Encounter: Payer: Self-pay | Admitting: Medical

## 2016-01-18 ENCOUNTER — Ambulatory Visit (INDEPENDENT_AMBULATORY_CARE_PROVIDER_SITE_OTHER): Payer: BLUE CROSS/BLUE SHIELD | Admitting: Medical

## 2016-01-18 VITALS — BP 110/90 | HR 91 | Temp 98.4°F | Ht 64.0 in | Wt 102.2 lb

## 2016-01-18 DIAGNOSIS — F411 Generalized anxiety disorder: Secondary | ICD-10-CM

## 2016-01-18 MED ORDER — TRAZODONE HCL 50 MG PO TABS: 25.0000 mg | ORAL_TABLET | Freq: Every evening | ORAL | 0 refills | Status: DC | PRN

## 2016-01-18 MED ORDER — CLONAZEPAM 1 MG PO TABS: 1.0000 mg | ORAL_TABLET | Freq: Two times a day (BID) | ORAL | 0 refills | Status: DC | PRN

## 2016-01-18 MED FILL — clonazePAM 1 MG TABS: 1 | 30 days supply | Qty: 60 | Fill #0

## 2016-01-18 NOTE — Progress Notes (Signed)
Pre visit review using our clinic tool,if applicable. No additional management support is needed unless otherwise documented below in the visit note.  

## 2016-01-18 NOTE — Telephone Encounter (Signed)
-----   Message from [name], DO sent at 01/17/2016  5:59 PM EDT ----- Edwena Bunde been asking her to see psych for months.  She can have 1 month with no refills --- and no more refills

## 2016-01-18 NOTE — Patient Instructions (Addendum)
You appear very anxious and without medication for 4 days. I am refilling you clonazepam  today. Giving one month supply per pcp instructions.   You have contacted psychiatrist and hopefully they will see you in 1-2 months.  I gave you refill of the 1 mg tab. You may want to occasionally take 1/2 tab so med may last longer while awaiting for psychiatrist appointment.  For your insomnia/trouble sleeping I refilled your trazadone.  Follow up with pcp as regularly scheduled or as needed

## 2016-01-18 NOTE — Progress Notes (Signed)
Subjective:    Patient ID: [name], female    DOB: Mar 22, 1976, 40 y.o.   MRN: [id number]  HPI  Pt in for anxiety. She needs a refill of her clonazepam. She feels very anxious. She states having anxiety attacks as well. She describes panic attacks. Pt had this since Sunday. Pt has had anxiety in the past. She is here for refill of her clonazepam. Pt not reporting any major life circumstances recently.  Pt pcp Dr. [name] agreed to refill her one more month of clonazepam with no refills. I will send that rx to her pharmacy.  Pt last used clonzepam on Friday.  Pt mentions trouble sleeping. She has been on trazadone before without any side effects. She states it did help her sleep.    Review of Systems  Constitutional: Negative for chills and fatigue.  HENT: Negative for congestion, ear discharge, ear pain and hearing loss.   Respiratory: Negative for cough, chest tightness, shortness of breath and wheezing.   Cardiovascular: Negative for chest pain and palpitations.  Musculoskeletal: Negative for back pain.  Skin: Negative for rash.  Neurological: Negative for dizziness, syncope, weakness, light-headedness and headaches.  Psychiatric/Behavioral: Positive for sleep disturbance. Negative for agitation, confusion, dysphoric mood and suicidal ideas. The patient is nervous/anxious.        Trouble sleeping due to anxiety.    Past Medical History:  Diagnosis Date  . 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 Anal fissure 03/11/2009  . Anemia   . Anxiety   . Anxiety and depression   . ARTHRITIS 08/31/2007  . Arthritis   . Asthma   . 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 Bipolar 1 disorder (HCC)   . Bowel obstruction   . BRONCHITIS, RECURRENT 08/23/2009   Asthmatic Bronchitis-Dr. Sherene Sires.....-HFA 75% 12/04/2008>75% 02/05/2009>75% 08/04/2009 -PFT's 01/04/2009 2.56 (86%) ratio 75, no resp to B2 and DLC0 67% > 80 after correction   . Cancer (HCC)    cervical cancer  . Chronic interstitial  cystitis 03/11/2009  . Chronic nausea   . Chronic pain   . Colon polyps   . COLONIC POLYPS, HX OF 07/25/2006   ADENOMATOUS POLYP  . COPD (chronic obstructive pulmonary disease) (HCC)   . DEPRESSION 08/31/2007  . Endometriosis   . FIBROMYALGIA 08/31/2007  . GERD 02/05/2009  . Hyperlipidemia   . HYPERTENSION 08/31/2007  . IBS 03/11/2009  . Internal hemorrhoids   . Migraine headache   . 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 PONV (postoperative nausea and vomiting)   . RECTAL BLEEDING 03/11/2009  . Seizures (HCC)    been about 1 year since last seisure per pt  . SLEEP APNEA 08/31/2007  . Thyroid disease   . Uterine cyst      Social History   Social History  . Marital status: Married    Spouse name: N/A  . Number of children: 2  . Years of education: N/A   Occupational History  .  Unemployed   Social History Main Topics  . Smoking status: Current Every Day Smoker    Packs/day: 0.50    Years: 14.00    Types: Cigarettes  . Smokeless tobacco: Never Used     Comment: trying to quit  . Alcohol use 0.0 oz/week     Comment: rare wine   . Drug use:     Types: Marijuana     Comment: occasional, pt states today 06/19/12 no drug use  . Sexual activity: Not on file   Other Topics Concern  . Not  on file   Social History Narrative   Homemaker   Daily Caffeine Use-Mtn. Dew          Past Surgical History:  Procedure Laterality Date  . ABDOMINAL HYSTERECTOMY    . bladder stretching x6    . BLADDER SURGERY     stimulator placed and stretching   . CHOLECYSTECTOMY    . COLONOSCOPY    . COLONOSCOPY WITH PROPOFOL N/A 09/03/2014   Procedure: COLONOSCOPY WITH PROPOFOL;  Surgeon: Rachael Fee, MD;  Location: [facility];  Service: Endoscopy;  Laterality: N/A;  . interstitial cystitis    . PACEMAKER INSERTION     in hip for interstitial cystitis  . removal of uterine cyst and scrapped uterus    . replaced bladder pacemaker      Family History  Problem Relation Age of Onset  . Heart  disease Father   . Asthma Maternal Grandmother   . Emphysema Maternal Grandfather   . Cancer Maternal Grandfather     Lung Cancer  . Cancer Other     Lung Cancer-Aunt  . Colon cancer Neg Hx   . Esophageal cancer Neg Hx   . Rectal cancer Neg Hx   . Stomach cancer Neg Hx   . Thyroid disease Neg Hx     Allergies  Allergen Reactions  . Abilify [Aripiprazole] Swelling, Other (See Comments) and Palpitations    tremors Throat swelling, tremors  . Metoclopramide Hcl Other (See Comments)    Causes seizures  . Propoxyphene Rash  . Tramadol Swelling, Other (See Comments) and Rash    Throat swelling, tremors  . Ambien [Zolpidem Tartrate] Other (See Comments)    hallucinations  . Eszopiclone Other (See Comments)    Hallucinations, hyper, bad taste in mouth   . Amitriptyline     Other reaction(s): Angioedema (ALLERGY/intolerance)  . Metoclopramide     Other reaction(s): Other (See Comments) Seizures  . Other Swelling    Acetaminophen #3 Acetaminophen #3  . Varenicline Other (See Comments)    Suicidal thoughts  . Buprenorphine Hcl Itching and Hives  . Demerol [Meperidine] Rash  . Emetrol Itching, Rash and Hives    Other reaction(s): Rash (ALLERGY/intolerance)  . Morphine And Related Hives and Itching  . Propoxyphene N-Acetaminophen Hives, Itching and Rash    Other reaction(s): Rash (ALLERGY/intolerance)    Current Outpatient Prescriptions on File Prior to Visit  Medication Sig Dispense Refill  . albuterol (PROVENTIL HFA;VENTOLIN HFA) 108 (90 Base) MCG/ACT inhaler Inhale 2 puffs into the lungs every 6 (six) hours as needed for wheezing or shortness of breath. 1 Inhaler 0  . escitalopram (LEXAPRO) 20 MG tablet Take 1 tablet (20 mg total) by mouth daily. 30 tablet 5  . lurasidone (LATUDA) 40 MG TABS tablet Take 1 tablet (40 mg total) by mouth daily with breakfast. 30 tablet 2  . ondansetron (ZOFRAN) 8 MG tablet Take 1 tablet (8 mg total) by mouth every 8 (eight) hours as needed for  nausea or vomiting. 4 tablet 0  . [DISCONTINUED] amitriptyline (ELAVIL) 25 MG tablet 2 tablets by mouth at bedtime     . [DISCONTINUED] clidinium-chlordiazePOXIDE (LIBRAX) 2.5-5 MG per capsule 2 capsules by mouth every morning and 1 at bedtime      No current facility-administered medications on file prior to visit.     BP 110/90   Pulse 91   Temp 98.4 F (36.9 C) (Oral)   Ht 5\' 4"  (1.626 m)   Wt 102 lb 3.2 oz (46.4 kg)  SpO2 98%   BMI 17.54 kg/m       Objective:   Physical Exam  General Mental Status- Alert. General Appearance- Not in acute distress. But appears very nervous and anxious today.  Skin General: Color- Normal Color. Moisture- Normal Moisture.  Neck Carotid Arteries- Normal color. Moisture- Normal Moisture. No carotid bruits. No JVD.  Chest and Lung Exam Auscultation: Breath Sounds:-Normal.  Cardiovascular Auscultation:Rythm- Regular. Murmurs & Other Heart Sounds:Auscultation of the heart reveals- No Murmurs.  Abdomen Inspection:-Inspeection Normal. Palpation/Percussion:Note:No mass. Palpation and Percussion of the abdomen reveal- Non Tender, Non Distended + BS, no rebound or guarding.    Neurologic Cranial Nerve exam:- CN III-XII intact(No nystagmus), symmetric smile. Strength:- 5/5 equal and symmetric strength both upper and lower extremities.      Assessment & Plan:  You appear very anxious and without medication for 4 days. I am refilling you medication today. Giving one month supply per pcp instructions.   I will give you the number of psychiatrist to contact. Please schedule the appointment asap.   I gave you refill of the 1 mg tab. You may want to occasionally take 1/2 tab so med may last longer while awaiting for psychiatrist appointment.  You  contacted psychiatrist and she estimates it may take 1-2 months to get one.  For your insomnia/trouble sleeping I refilled your trazadone.  Follow up with pcp as regularly scheduled or as  needed  Paz Fuentes, Ramon Dredge, PA-C

## 2016-01-19 NOTE — Telephone Encounter (Signed)
Patient seen by E. Saguier in office today.

## 2016-01-27 DIAGNOSIS — N9489 Other specified conditions associated with female genital organs and menstrual cycle: Secondary | ICD-10-CM | POA: Diagnosis not present

## 2016-01-27 DIAGNOSIS — N3010 Interstitial cystitis (chronic) without hematuria: Secondary | ICD-10-CM | POA: Diagnosis not present

## 2016-01-27 DIAGNOSIS — R102 Pelvic and perineal pain: Secondary | ICD-10-CM | POA: Diagnosis not present

## 2016-02-14 ENCOUNTER — Telehealth: Payer: Self-pay | Admitting: Family Medicine

## 2016-02-14 NOTE — Telephone Encounter (Signed)
Last ov 01/18/16. Last fill 01/18/16 #60 0. Please advise.

## 2016-02-14 NOTE — Telephone Encounter (Signed)
Caller name: Relationship to patient: Self Can be reached: [phone] (Mom) Pharmacy:  [facility], [place] [phone] (Phone) [phone] (Fax)     Reason for call: Patient request to have a refill on clonazePAM (KLONOPIN) 1 MG tablet [id number]. States she is in Out-patient treatment at [facility]. Their number is [phone]. Appointment for medication management is not until 11/29.

## 2016-02-14 NOTE — Telephone Encounter (Signed)
Patient called to follow up on this.

## 2016-02-15 ENCOUNTER — Telehealth: Payer: Self-pay

## 2016-02-15 ENCOUNTER — Other Ambulatory Visit: Payer: Self-pay

## 2016-02-15 MED ORDER — CLONAZEPAM 1 MG PO TABS: 1.0000 mg | ORAL_TABLET | Freq: Two times a day (BID) | ORAL | 0 refills | Status: DC | PRN

## 2016-02-15 NOTE — Telephone Encounter (Signed)
Patient requesting refill Rx Klonopin. Last ov 01/18/16. Last fill 01/18/16 #60 0. Please advise.

## 2016-02-15 NOTE — Telephone Encounter (Signed)
Rx printed and faxed to pharmacy. LB

## 2016-02-15 NOTE — Telephone Encounter (Signed)
Refill x1 

## 2016-02-15 NOTE — Telephone Encounter (Signed)
Patient called stating that the [facility] stated that they did not have this prescription. She requested that it go to [facility] in [place]. She would like a call back to know when it is sent. Please advise.   Pharmacy: [facility], [place], [place] [address]  Patient phone: [phone]

## 2016-02-16 ENCOUNTER — Telehealth: Payer: Self-pay

## 2016-02-16 NOTE — Telephone Encounter (Signed)
Marjory Lies, LPN  [id number] AM  Note    Patient picked up Rx today at 9:15 am. Patient report pharmacy does not have Rx. LB

## 2016-02-16 NOTE — Telephone Encounter (Signed)
Patient picked up Rx today at 9:15 am. Patient report pharmacy does not have Rx. LB

## 2016-03-22 ENCOUNTER — Emergency Department (HOSPITAL_BASED_OUTPATIENT_CLINIC_OR_DEPARTMENT_OTHER): Payer: BLUE CROSS/BLUE SHIELD

## 2016-03-22 ENCOUNTER — Encounter (HOSPITAL_BASED_OUTPATIENT_CLINIC_OR_DEPARTMENT_OTHER): Payer: Self-pay | Admitting: Emergency Medicine

## 2016-03-22 ENCOUNTER — Emergency Department (HOSPITAL_BASED_OUTPATIENT_CLINIC_OR_DEPARTMENT_OTHER)
Admission: EM | Admit: 2016-03-22 | Discharge: 2016-03-22 | Disposition: A | Discharge status: Home / Self Care | Payer: BLUE CROSS/BLUE SHIELD | Attending: Emergency Medicine | Admitting: Emergency Medicine

## 2016-03-22 DIAGNOSIS — M5442 Lumbago with sciatica, left side: Secondary | ICD-10-CM | POA: Diagnosis not present

## 2016-03-22 DIAGNOSIS — Y9389 Activity, other specified: Secondary | ICD-10-CM | POA: Diagnosis not present

## 2016-03-22 DIAGNOSIS — M545 Low back pain: Secondary | ICD-10-CM | POA: Diagnosis not present

## 2016-03-22 DIAGNOSIS — J441 Chronic obstructive pulmonary disease with (acute) exacerbation: Secondary | ICD-10-CM | POA: Diagnosis not present

## 2016-03-22 DIAGNOSIS — F17210 Nicotine dependence, cigarettes, uncomplicated: Secondary | ICD-10-CM | POA: Diagnosis not present

## 2016-03-22 DIAGNOSIS — J45909 Unspecified asthma, uncomplicated: Secondary | ICD-10-CM | POA: Diagnosis not present

## 2016-03-22 DIAGNOSIS — X500XXA Overexertion from strenuous movement or load, initial encounter: Secondary | ICD-10-CM | POA: Insufficient documentation

## 2016-03-22 DIAGNOSIS — Y929 Unspecified place or not applicable: Secondary | ICD-10-CM | POA: Diagnosis not present

## 2016-03-22 DIAGNOSIS — Y990 Civilian activity done for income or pay: Secondary | ICD-10-CM | POA: Diagnosis not present

## 2016-03-22 DIAGNOSIS — Z79899 Other long term (current) drug therapy: Secondary | ICD-10-CM | POA: Insufficient documentation

## 2016-03-22 DIAGNOSIS — S3992XA Unspecified injury of lower back, initial encounter: Secondary | ICD-10-CM | POA: Diagnosis present

## 2016-03-22 DIAGNOSIS — Z8541 Personal history of malignant neoplasm of cervix uteri: Secondary | ICD-10-CM | POA: Diagnosis not present

## 2016-03-22 DIAGNOSIS — I10 Essential (primary) hypertension: Secondary | ICD-10-CM | POA: Insufficient documentation

## 2016-03-22 HISTORY — DX: Fibromyalgia: M79.7

## 2016-03-22 LAB — URINALYSIS, ROUTINE W REFLEX MICROSCOPIC
Bilirubin Urine: NEGATIVE
Glucose, UA: NEGATIVE mg/dL
Hgb urine dipstick: NEGATIVE
Ketones, ur: NEGATIVE mg/dL
Leukocytes, UA: NEGATIVE
Nitrite: NEGATIVE
Protein, ur: NEGATIVE mg/dL
Specific Gravity, Urine: 1.018 (ref 1.005–1.030)
pH: 6 (ref 5.0–8.0)

## 2016-03-22 MED ORDER — HYDROMORPHONE HCL 1 MG/ML IJ SOLN
1.0000 mg | Freq: Once | INTRAMUSCULAR | Status: AC
  Administered 2016-03-22: 1 mg via INTRAVENOUS
  Filled 2016-03-22: qty 1

## 2016-03-22 MED ORDER — OXYCODONE HCL 5 MG PO TABS: 5.0000 mg | ORAL_TABLET | ORAL | 0 refills | Status: AC | PRN

## 2016-03-22 MED ORDER — ONDANSETRON HCL 4 MG/2ML IJ SOLN
4.0000 mg | Freq: Once | INTRAMUSCULAR | Status: AC
  Administered 2016-03-22: 4 mg via INTRAVENOUS
  Filled 2016-03-22: qty 2

## 2016-03-22 NOTE — ED Triage Notes (Signed)
Patient states she has a bladder stimulator in place, she jarred a wire loose. Patient states she was supposed to go have it looked at but does not have an appointment. Today the patient reports she moved wrong and it now feels like she is being continually shocked. Patient did not attempt to call the physician involved in the care of this. Patient is sobbing.

## 2016-03-22 NOTE — ED Notes (Signed)
Pt states her bladder stimulator has been malfunctioning for a month and she saw her Urologist 2 weeks ago and they "turned it down" using a control device pt has at home.  Pt states initially she received some relief with that but that it has been getting continually worse since.  Pt states she does not know how to turn of the stimulator with her at home control and that the wire has migrated down from her pelvis into her upper leg and she was unable to get in to see her Urologist today "because they closed at 5."  Pt states she was lifting something at work earlier today when she felt the wire move and has been in excruciating pain since.  Pt tearful in bed during assessment.

## 2016-03-22 NOTE — ED Provider Notes (Signed)
[facility] DEPT [facility] Provider Note   CSN: [id number] Arrival date & time: 03/22/16  1703 By signing my name below, I, [name], attest that this documentation has been prepared under the direction and in the presence of Lindsay Etienne, DO . Electronically Signed: Dyke Orozco, Scribe. 03/22/2016. 6:20 PM.   History   Chief Complaint Chief Complaint  Patient presents with  . Back Pain   HPI [name] is a 40 y.o. female with a PMHX of IC, adenomatous colonic polyp, and fibromyalgia who presents to the Emergency Department complaining of progressively worsening, waxing and waning back pain that radiates down her RLE onset one month ago. Pt states her bladder stimulator has been malfunctioning for one month. She was seen at her urologist two weeks ago and her stimulator was turned down which she states gave her some initial relief. Today while at work she was lifting something and felt the wire shift, causing her severe pain. Pt states the wire has migrated down her pelvis and into her right upper leg. She describes her pain as intermittent, frequents shocks. Per pt, she was here last year for the same and required surgery to replace her bladder stimulator. She notes associated suprapubic pain and RLE weakness. No treatments tried PTA. No other alleviating or modifying factors noted. Pt denies any numbness to her RLE, vaginal discharge, or vaginal bleeding.   The history is provided by the patient. No language interpreter was used.   Past Medical History:  Diagnosis Date  . 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 Anal fissure 03/11/2009  . Anemia   . Anxiety   . Anxiety and depression   . ARTHRITIS 08/31/2007  . Arthritis   . Asthma   . 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 Bipolar 1 disorder ([place])   . Bowel obstruction   . BRONCHITIS, RECURRENT 08/23/2009   Asthmatic Bronchitis-Dr. Melvyn Novas.....-HFA 75% 12/04/2008>75% 02/05/2009>75% 08/04/2009 -PFT's 01/04/2009 2.56 (86%) ratio 75, no  resp to B2 and DLC0 67% > 80 after correction   . Cancer (HCC)    cervical cancer  . Chronic interstitial cystitis 03/11/2009  . Chronic nausea   . Chronic pain   . Colon polyps   . COLONIC POLYPS, HX OF 07/25/2006   ADENOMATOUS POLYP  . COPD (chronic obstructive pulmonary disease) ([place])   . DEPRESSION 08/31/2007  . Endometriosis   . FIBROMYALGIA 08/31/2007  . Fibromyalgia   . GERD 02/05/2009  . Hyperlipidemia   . HYPERTENSION 08/31/2007  . IBS 03/11/2009  . Internal hemorrhoids   . Migraine headache   . 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 PONV (postoperative nausea and vomiting)   . RECTAL BLEEDING 03/11/2009  . Seizures ([place])    been about 1 year since last seisure per pt  . SLEEP APNEA 08/31/2007  . Thyroid disease   . Uterine cyst     Patient Active Problem List   Diagnosis Date Noted  . Hyperthyroidism 02/17/2015  . Anxiety 01/28/2015  . Clinical depression 01/28/2015  . COPD exacerbation ([place]) 11/26/2013  . Vaginitis and vulvovaginitis 11/26/2013  . Recurrent pneumonia 02/02/2013  . Left arm pain 01/13/2013  . Left arm pain 01/13/2013    Class: Acute  . Dizziness and giddiness 01/13/2013  . Acute bronchitis 12/07/2012  . Rib pain 12/07/2012  . Insomnia 08/26/2012  . Diarrhea 06/19/2012  . Heme positive stool 06/19/2012  . Bloating 06/19/2012  . Nausea alone 06/19/2012  . Bladder retention 04/24/2012  . Tremor 03/05/2012  . Dysuria 03/05/2012  . Failure to  thrive in adult 06/21/2011  . Airway hyperreactivity 03/08/2011  . Back pain, chronic 03/08/2011  . Chronic obstructive pulmonary disease ([place]) 03/08/2011  . Acid reflux 03/08/2011  . Cystitis 01/22/2011  . Bilateral hand numbness 01/13/2011  . WEIGHT LOSS 06/24/2010  . RIB PAIN, LEFT SIDED 05/04/2010  . ABDOMINAL PAIN, LEFT UPPER QUADRANT 05/04/2010  . Unspecified otitis media 10/26/2009  . BRONCHITIS, RECURRENT 08/23/2009  . URI, ACUTE 08/04/2009  . FATIGUE 07/06/2009  . IBS 03/11/2009  . ANAL FISSURE  03/11/2009  . RECTAL BLEEDING 03/11/2009  . Chronic interstitial cystitis 03/11/2009  . COLONIC POLYPS, HX OF 03/11/2009  . GERD 02/05/2009  . SMOKER 12/04/2008  . chronic asthma poorly controlled 12/04/2008  . 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 Bipolar 1 disorder ([place]) 08/31/2007  . HYPERTENSION 08/31/2007  . EMPHYSEMA 08/31/2007  . 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 ARTHRITIS 08/31/2007  . FIBROMYALGIA 08/31/2007  . SLEEP APNEA 08/31/2007    Past Surgical History:  Procedure Laterality Date  . ABDOMINAL HYSTERECTOMY    . bladder stretching x6    . BLADDER SURGERY     stimulator placed and stretching   . CHOLECYSTECTOMY    . COLONOSCOPY    . COLONOSCOPY WITH PROPOFOL N/A 09/03/2014   Procedure: COLONOSCOPY WITH PROPOFOL;  Surgeon: Milus Banister, MD;  Location: [facility] END[facility];  Service: Endoscopy;  Laterality: N/A;  . interstitial cystitis    . PACEMAKER INSERTION     in hip for interstitial cystitis  . removal of uterine cyst and scrapped uterus    . replaced bladder pacemaker      OB History    No data available       Home Medications    Prior to Admission medications   Medication Sig Start Date End Date Taking? Authorizing Provider  ACETAMINOPHEN-CODEINE #4 PO Take by mouth. Take for up tp 10 days. Written 01/17/2016   Yes Historical Provider, MD  albuterol (PROVENTIL HFA;VENTOLIN HFA) 108 (90 Base) MCG/ACT inhaler Inhale 2 puffs into the lungs every 6 (six) hours as needed for wheezing or shortness of breath. 10/18/15  Yes Edward Saguier, PA-C  clonazePAM (KLONOPIN) 1 MG tablet Take 1 tablet (1 mg total) by mouth 2 (two) times daily as needed for anxiety. 02/15/16  Yes Yvonne R Lowne Chase, DO  omeprazole (PRILOSEC) 40 MG capsule Take 40 mg by mouth daily.   Yes Historical Provider, MD  ondansetron (ZOFRAN) 8 MG tablet Take 1 tablet (8 mg total) by mouth every 8 (eight) hours as needed for nausea or vomiting. 10/17/15  Yes Orlie Dakin, MD  traZODone (DESYREL) 50 MG tablet Take 0.5-1 tablets (25-50 mg total) by mouth at bedtime as needed for sleep. 01/18/16  Yes Edward Saguier, PA-C  UNKNOWN TO PATIENT    Yes Historical Provider, MD  escitalopram (LEXAPRO) 20 MG tablet Take 1 tablet (20 mg total) by mouth daily. 11/08/15   Rosalita Chessman Chase, DO  lurasidone (LATUDA) 40 MG TABS tablet Take 1 tablet (40 mg total) by mouth daily with breakfast. 11/08/15   Rosalita Chessman Chase, DO  oxyCODONE (ROXICODONE) 5 MG immediate release tablet Take 1 tablet (5 mg total) by mouth every 4 (four) hours as needed for severe pain. 03/22/16 03/24/16  Lindsay Etienne, DO    Family History Family History  Problem Relation Age of Onset  . Heart disease Father   . Asthma Maternal Grandmother   . Emphysema Maternal Grandfather   . Cancer Maternal Grandfather  Lung Cancer  . Cancer Other     Lung Cancer-Aunt  . Colon cancer Neg Hx   . Esophageal cancer Neg Hx   . Rectal cancer Neg Hx   . Stomach cancer Neg Hx   . Thyroid disease Neg Hx     Social History Social History  Substance Use Topics  . Smoking status: Current Every Day Smoker    Packs/day: 1.00    Years: 14.00    Types: Cigarettes  . Smokeless tobacco: Never Used     Comment: trying to quit  . Alcohol use 0.0 oz/week     Comment: rare wine     Allergies   Abilify [aripiprazole]; Metoclopramide hcl; Propoxyphene; Tramadol; Ambien [zolpidem tartrate]; Eszopiclone; Amitriptyline; Metoclopramide; Other; Varenicline; Buprenorphine hcl; Demerol [meperidine]; Emetrol; Morphine and related; and Propoxyphene n-acetaminophen   Review of Systems Review of Systems  Constitutional: Negative for chills and fever.  HENT: Negative for congestion and rhinorrhea.   Eyes: Negative for redness and visual disturbance.  Respiratory: Negative for shortness of breath and wheezing.   Cardiovascular: Negative for chest pain and palpitations.  Gastrointestinal: Positive for abdominal  pain. Negative for nausea and vomiting.  Genitourinary: Negative for dysuria, urgency, vaginal bleeding and vaginal discharge.  Musculoskeletal: Positive for back pain. Negative for arthralgias and myalgias.  Skin: Negative for pallor and wound.  Neurological: Positive for weakness. Negative for dizziness, numbness and headaches.    Physical Exam Updated Vital Signs BP 117/87 (BP Location: Right Arm)   Pulse 102   Temp 98.5 F (36.9 C) (Oral)   Resp 18   Ht 5' 3.5" (1.613 m)   Wt 130 lb (59 kg)   SpO2 97%   BMI 22.67 kg/m   Physical Exam  Constitutional: She is oriented to person, place, and time. She appears well-developed and well-nourished. No distress.  HENT:  Head: Normocephalic and atraumatic.  Eyes: EOM are normal. Pupils are equal, round, and reactive to light.  Neck: Normal range of motion. Neck supple.  Cardiovascular: Normal rate and regular rhythm.  Exam reveals no gallop and no friction rub.   No murmur heard. Pulmonary/Chest: Effort normal. She has no wheezes. She has no rales.  Abdominal: Soft. She exhibits no distension. There is no tenderness.  Musculoskeletal: She exhibits tenderness. She exhibits no edema.  Intact pulse and motor sensation to RLE. No clonus. Diffuse tenderness about her lower back and left buttock with no pinpoint area.   Neurological: She is alert and oriented to person, place, and time.  Skin: Skin is warm and dry. She is not diaphoretic.  Psychiatric: She has a normal mood and affect. Her behavior is normal.  Nursing note and vitals reviewed.  ED Treatments / Results  DIAGNOSTIC STUDIES:  Oxygen Saturation is 98% on RA, normal by my interpretation.    COORDINATION OF CARE:  6:03 PM Discussed treatment plan with pt at bedside and pt agreed to plan.  Labs (all labs ordered are listed, but only abnormal results are displayed) Labs Reviewed  URINALYSIS, ROUTINE W REFLEX MICROSCOPIC - Abnormal; Notable for the following:       Result  Value   APPearance CLOUDY (*)    All other components within normal limits  URINE CULTURE    EKG  EKG Interpretation None       Radiology Dg Lumbar Spine Complete  Result Date: 03/22/2016 CLINICAL DATA:  40 year old female with a history of lumbar back pain EXAM: LUMBAR SPINE - COMPLETE 4+ VIEW COMPARISON:  None. FINDINGS:  Lumbar Spine: Lumbar vertebral elements maintain normal alignment without evidence of anterolisthesis, retrolisthesis, subluxation. No fracture line identified. Vertebral body heights maintained as well as disc space heights. No significant degenerative disc disease or endplate changes. No significant facet changes. Surgical changes of cholecystectomy. Generator within the soft tissues overlying the left gluteal region with leads in place in the pelvis, unchanged. Oblique images demonstrate no displaced pars defect. IMPRESSION: No acute fracture malalignment of the lumbar spine. Signed, Dulcy Fanny. Earleen Newport, DO [facility] Electronically Signed   By: Corrie Mckusick D.O.   On: 03/22/2016 19:00    Procedures Procedures (including critical care time)  Medications Ordered in ED Medications  HYDROmorphone (DILAUDID) injection 1 mg (1 mg Intravenous Given 03/22/16 1826)  ondansetron (ZOFRAN) injection 4 mg (4 mg Intravenous Given 03/22/16 1826)  HYDROmorphone (DILAUDID) injection 1 mg (1 mg Intravenous Given 03/22/16 1907)     Initial Impression / Assessment and Plan / ED Course  I have reviewed the triage vital signs and the nursing notes.  Pertinent labs & imaging results that were available during my care of the patient were reviewed by me and considered in my medical decision making (see chart for details).  Clinical Course     40 yo F With a chief complaint of low back pain. Patient states that her bladder stimulator has come loose and is now getting her electric shocks into her leg. The pain sounds somewhat  similar to sciatica. She does have a history of the same. X-ray of the low back shows no malalignment of the L-spine and no change to her stimulator location. Attempted to discuss with [facility]. They unfortunately had a emergent OR case. Upon reassessment the patient was requesting discharge home. She negotiated for a days worth of narcotics. Will follow-up with her urologist tomorrow.  I was able to talk with the urologist on call, Dr. Bevelyn Ngo after the patient was discharged.  He agreed with the workup and will mark her for follow-up.  :  I have discussed the diagnosis/risks/treatment options with the patient and family and believe the pt to be eligible for discharge home to follow-up with Urology. We also discussed returning to the ED immediately if new or worsening sx occur. We discussed the sx which are most concerning (e.g., sudden worsening pain, fever, inability to tolerate by mouth) that necessitate immediate return. Medications administered to the patient during their visit and any new prescriptions provided to the patient are listed below.  Medications given during this visit Medications  HYDROmorphone (DILAUDID) injection 1 mg (1 mg Intravenous Given 03/22/16 1826)  ondansetron (ZOFRAN) injection 4 mg (4 mg Intravenous Given 03/22/16 1826)  HYDROmorphone (DILAUDID) injection 1 mg (1 mg Intravenous Given 03/22/16 1907)     The patient appears reasonably screen and/or stabilized for discharge and I doubt any other medical condition or other [facility] requiring further screening, evaluation, or treatment in the ED at this time prior to discharge.    Final Clinical Impressions(s) / ED Diagnoses   Final diagnoses:  Acute left-sided low back pain with left-sided sciatica    New Prescriptions Discharge Medication List as of 03/22/2016  9:48 PM    START taking these medications   Details  oxyCODONE (ROXICODONE) 5 MG immediate release tablet Take 1 tablet (5 mg total) by mouth  every 4 (four) hours as needed for severe pain., Starting Wed 03/22/2016, Until Fri 03/24/2016, Print      I personally performed the services  described in this documentation, which was scribed in my presence. The recorded information has been reviewed and is accurate.      Lindsay Etienne, DO 03/22/16 2338

## 2016-03-22 NOTE — Discharge Instructions (Signed)
Call your urologist in the morning

## 2016-03-24 LAB — URINE CULTURE: Culture: NO GROWTH

## 2016-04-12 DIAGNOSIS — Z681 Body mass index (BMI) 19 or less, adult: Secondary | ICD-10-CM | POA: Diagnosis not present

## 2016-04-12 DIAGNOSIS — Z01419 Encounter for gynecological examination (general) (routine) without abnormal findings: Secondary | ICD-10-CM | POA: Diagnosis not present

## 2016-04-17 ENCOUNTER — Telehealth: Payer: Self-pay | Admitting: Family Medicine

## 2016-04-17 ENCOUNTER — Encounter (HOSPITAL_BASED_OUTPATIENT_CLINIC_OR_DEPARTMENT_OTHER): Payer: Self-pay | Admitting: Emergency Medicine

## 2016-04-17 ENCOUNTER — Emergency Department (HOSPITAL_BASED_OUTPATIENT_CLINIC_OR_DEPARTMENT_OTHER): Payer: 59

## 2016-04-17 ENCOUNTER — Emergency Department (HOSPITAL_BASED_OUTPATIENT_CLINIC_OR_DEPARTMENT_OTHER)
Admission: EM | Admit: 2016-04-17 | Discharge: 2016-04-17 | Disposition: A | Discharge status: Home / Self Care | Payer: 59 | Attending: Emergency Medicine | Admitting: Emergency Medicine

## 2016-04-17 ENCOUNTER — Telehealth: Payer: Self-pay

## 2016-04-17 DIAGNOSIS — I10 Essential (primary) hypertension: Secondary | ICD-10-CM | POA: Diagnosis not present

## 2016-04-17 DIAGNOSIS — J45909 Unspecified asthma, uncomplicated: Secondary | ICD-10-CM | POA: Diagnosis not present

## 2016-04-17 DIAGNOSIS — Z950 Presence of cardiac pacemaker: Secondary | ICD-10-CM | POA: Insufficient documentation

## 2016-04-17 DIAGNOSIS — Z8541 Personal history of malignant neoplasm of cervix uteri: Secondary | ICD-10-CM | POA: Diagnosis not present

## 2016-04-17 DIAGNOSIS — J441 Chronic obstructive pulmonary disease with (acute) exacerbation: Secondary | ICD-10-CM | POA: Insufficient documentation

## 2016-04-17 DIAGNOSIS — R0602 Shortness of breath: Secondary | ICD-10-CM | POA: Diagnosis not present

## 2016-04-17 DIAGNOSIS — R05 Cough: Secondary | ICD-10-CM | POA: Diagnosis not present

## 2016-04-17 DIAGNOSIS — R112 Nausea with vomiting, unspecified: Secondary | ICD-10-CM | POA: Insufficient documentation

## 2016-04-17 DIAGNOSIS — F17210 Nicotine dependence, cigarettes, uncomplicated: Secondary | ICD-10-CM | POA: Diagnosis not present

## 2016-04-17 DIAGNOSIS — E876 Hypokalemia: Secondary | ICD-10-CM | POA: Diagnosis not present

## 2016-04-17 LAB — MAGNESIUM: Magnesium: 1.7 mg/dL (ref 1.7–2.4)

## 2016-04-17 LAB — COMPREHENSIVE METABOLIC PANEL
ALT: 7 U/L — ABNORMAL LOW (ref 14–54)
AST: 22 U/L (ref 15–41)
Albumin: 4.1 g/dL (ref 3.5–5.0)
Alkaline Phosphatase: 81 U/L (ref 38–126)
Anion gap: 10 (ref 5–15)
BUN: 6 mg/dL (ref 6–20)
CO2: 22 mmol/L (ref 22–32)
Calcium: 9.6 mg/dL (ref 8.9–10.3)
Chloride: 106 mmol/L (ref 101–111)
Creatinine, Ser: 0.73 mg/dL (ref 0.44–1.00)
GFR calc Af Amer: >60 mL/min (ref >60)
GFR calc non Af Amer: >60 mL/min (ref >60)
Glucose, Bld: 97 mg/dL (ref 65–99)
Potassium: 2.5 mmol/L — CL (ref 3.5–5.1)
Sodium: 138 mmol/L (ref 135–145)
Total Bilirubin: 0.2 mg/dL — ABNORMAL LOW (ref 0.3–1.2)
Total Protein: 7.3 g/dL (ref 6.5–8.1)

## 2016-04-17 LAB — TROPONIN I: Troponin I: <0.03 ng/mL (ref <0.03)

## 2016-04-17 LAB — CBC WITH DIFFERENTIAL/PLATELET
Basophils Absolute: 0 10*3/uL (ref 0.0–0.1)
Basophils Relative: 0 %
Eosinophils Absolute: 0 10*3/uL (ref 0.0–0.7)
Eosinophils Relative: 0 %
HCT: 39.9 % (ref 36.0–46.0)
Hemoglobin: 13.3 g/dL (ref 12.0–15.0)
Lymphocytes Relative: 25 %
Lymphs Abs: 2.8 10*3/uL (ref 0.7–4.0)
MCH: 31 pg (ref 26.0–34.0)
MCHC: 33.3 g/dL (ref 30.0–36.0)
MCV: 93 fL (ref 78.0–100.0)
Monocytes Absolute: 0.8 10*3/uL (ref 0.1–1.0)
Monocytes Relative: 8 %
Neutro Abs: 7.6 10*3/uL (ref 1.7–7.7)
Neutrophils Relative %: 67 %
Platelets: 348 10*3/uL (ref 150–400)
RBC: 4.29 MIL/uL (ref 3.87–5.11)
RDW: 12.1 % (ref 11.5–15.5)
WBC: 11.2 10*3/uL — ABNORMAL HIGH (ref 4.0–10.5)

## 2016-04-17 LAB — BASIC METABOLIC PANEL
Anion gap: 9 (ref 5–15)
BUN: 6 mg/dL (ref 6–20)
CO2: 22 mmol/L (ref 22–32)
Calcium: 8.8 mg/dL — ABNORMAL LOW (ref 8.9–10.3)
Chloride: 108 mmol/L (ref 101–111)
Creatinine, Ser: 0.85 mg/dL (ref 0.44–1.00)
GFR calc Af Amer: >60 mL/min (ref >60)
GFR calc non Af Amer: >60 mL/min (ref >60)
Glucose, Bld: 130 mg/dL — ABNORMAL HIGH (ref 65–99)
Potassium: 2.8 mmol/L — ABNORMAL LOW (ref 3.5–5.1)
Sodium: 139 mmol/L (ref 135–145)

## 2016-04-17 MED ORDER — ONDANSETRON HCL 4 MG/2ML IJ SOLN
4.0000 mg | Freq: Once | INTRAMUSCULAR | Status: AC
  Administered 2016-04-17: 4 mg via INTRAVENOUS

## 2016-04-17 MED ORDER — PREDNISONE 50 MG PO TABS
50.0000 mg | ORAL_TABLET | Freq: Once | ORAL | Status: AC
  Administered 2016-04-17: 50 mg via ORAL
  Filled 2016-04-17: qty 1

## 2016-04-17 MED ORDER — POTASSIUM CHLORIDE CRYS ER 20 MEQ PO TBCR: 20.0000 meq | EXTENDED_RELEASE_TABLET | Freq: Two times a day (BID) | ORAL | 0 refills | Status: DC

## 2016-04-17 MED ORDER — ONDANSETRON HCL 4 MG/2ML IJ SOLN
INTRAMUSCULAR | Status: AC
  Filled 2016-04-17: qty 2

## 2016-04-17 MED ORDER — PROMETHAZINE HCL 25 MG/ML IJ SOLN
12.5000 mg | Freq: Once | INTRAMUSCULAR | Status: AC
  Administered 2016-04-17: 12.5 mg via INTRAVENOUS
  Filled 2016-04-17: qty 1

## 2016-04-17 MED ORDER — PREDNISONE 20 MG PO TABS: ORAL_TABLET | ORAL | 0 refills | Status: DC

## 2016-04-17 MED ORDER — POTASSIUM CHLORIDE CRYS ER 20 MEQ PO TBCR
40.0000 meq | EXTENDED_RELEASE_TABLET | Freq: Once | ORAL | Status: AC
  Administered 2016-04-17: 40 meq via ORAL
  Filled 2016-04-17: qty 2

## 2016-04-17 MED ORDER — ALBUTEROL (5 MG/ML) CONTINUOUS INHALATION SOLN
10.0000 mg/h | INHALATION_SOLUTION | Freq: Once | RESPIRATORY_TRACT | Status: AC
  Administered 2016-04-17: 10 mg/h via RESPIRATORY_TRACT
  Filled 2016-04-17: qty 20

## 2016-04-17 MED ORDER — AEROCHAMBER PLUS W/MASK MISC
1.0000 | Freq: Once | Status: AC
  Administered 2016-04-17: 1
  Filled 2016-04-17: qty 1

## 2016-04-17 NOTE — ED Provider Notes (Signed)
[facility] DEPT [facility] Provider Note   CSN: [id number] Arrival date & time: 04/17/16  1517  By signing my name below, I, Reola Mosher, attest that this documentation has been prepared under the direction and in the presence of Orlie Dakin, MD. Electronically Signed: Reola Mosher, ED Scribe. 04/17/16. 5:02 PM.  History   Chief Complaint Chief Complaint  Patient presents with  . Shortness of Breath  . Chest Pain   The history is provided by the patient. No language interpreter was used.   HPI Comments: [name] is a 41 y.o. female with a h/o COPD, interstitial cystitis w/ pacemaker placement, and fibromyalgia, who presents to the Emergency Department complaining of gradually worsening, persistent shortness of breath beginning approximately two weeks ago. Pt notes that she has had chest congestion, productive cough w/ green sputum, nausea, vomiting (last yesterday, x2 episodes yesterday), loss of appetite, and light-headedness secondary to her shortness of breath. Dyspnea is worse with walking and feels like COPD exacerbation or asthma exacerbation. Pt has a h/o COPD and asthma and states that her symptoms are consistent w/ flare-ups of this. No prior hospital admissions, intubations, or ICU admits related to this. Pt additionally has been using her Albuterol inhaler more than normal since the onset of her symptoms, quantified at 4-5 times a day, with minimal relief of her symptoms. She has previously tolerated Steroids for this with relief of her flareups, and states that steroids are of some benefit. Pt is a current, everyday smoker (beginning ~12 years ago). Pt is s/p total hysterectomy. She denies fever, or any other associated symptoms.    Past Medical History:  Diagnosis Date  . 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 Anal fissure 03/11/2009  . Anemia   . Anxiety   . Anxiety and depression   . ARTHRITIS 08/31/2007  . Arthritis   . Asthma   . BENZODIAZEPINE  ADDICTION 08/31/2007  . Bipolar 1 disorder ([place])   . Bowel obstruction   . BRONCHITIS, RECURRENT 08/23/2009   Asthmatic Bronchitis-Dr. Melvyn Novas.....-HFA 75% 12/04/2008>75% 02/05/2009>75% 08/04/2009 -PFT's 01/04/2009 2.56 (86%) ratio 75, no resp to B2 and DLC0 67% > 80 after correction   . Cancer (HCC)    cervical cancer  . Chronic interstitial cystitis 03/11/2009  . Chronic nausea   . Chronic pain   . Colon polyps   . COLONIC POLYPS, HX OF 07/25/2006   ADENOMATOUS POLYP  . COPD (chronic obstructive pulmonary disease) ([place])   . DEPRESSION 08/31/2007  . Endometriosis   . FIBROMYALGIA 08/31/2007  . Fibromyalgia   . GERD 02/05/2009  . Hyperlipidemia   . HYPERTENSION 08/31/2007  . IBS 03/11/2009  . Internal hemorrhoids   . Migraine headache   . 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 PONV (postoperative nausea and vomiting)   . RECTAL BLEEDING 03/11/2009  . Seizures ([place])    been about 1 year since last seisure per pt  . SLEEP APNEA 08/31/2007  . Thyroid disease   . Uterine cyst    Patient Active Problem List   Diagnosis Date Noted  . Hyperthyroidism 02/17/2015  . Anxiety 01/28/2015  . Clinical depression 01/28/2015  . COPD exacerbation ([place]) 11/26/2013  . Vaginitis and vulvovaginitis 11/26/2013  . Recurrent pneumonia 02/02/2013  . Left arm pain 01/13/2013  . Left arm pain 01/13/2013    Class: Acute  . Dizziness and giddiness 01/13/2013  . Acute bronchitis 12/07/2012  . Rib pain 12/07/2012  . Insomnia 08/26/2012  . Diarrhea 06/19/2012  . Heme positive stool 06/19/2012  .  Bloating 06/19/2012  . Nausea alone 06/19/2012  . Bladder retention 04/24/2012  . Tremor 03/05/2012  . Dysuria 03/05/2012  . Failure to thrive in adult 06/21/2011  . Airway hyperreactivity 03/08/2011  . Back pain, chronic 03/08/2011  . Chronic obstructive pulmonary disease ([place]) 03/08/2011  . Acid reflux 03/08/2011  . Cystitis 01/22/2011  . Bilateral hand numbness 01/13/2011  . WEIGHT LOSS 06/24/2010  . RIB PAIN,  LEFT SIDED 05/04/2010  . ABDOMINAL PAIN, LEFT UPPER QUADRANT 05/04/2010  . Unspecified otitis media 10/26/2009  . BRONCHITIS, RECURRENT 08/23/2009  . URI, ACUTE 08/04/2009  . FATIGUE 07/06/2009  . IBS 03/11/2009  . ANAL FISSURE 03/11/2009  . RECTAL BLEEDING 03/11/2009  . Chronic interstitial cystitis 03/11/2009  . COLONIC POLYPS, HX OF 03/11/2009  . GERD 02/05/2009  . SMOKER 12/04/2008  . chronic asthma poorly controlled 12/04/2008  . 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 Bipolar 1 disorder ([place]) 08/31/2007  . HYPERTENSION 08/31/2007  . EMPHYSEMA 08/31/2007  . 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 ARTHRITIS 08/31/2007  . FIBROMYALGIA 08/31/2007  . SLEEP APNEA 08/31/2007   Past Surgical History:  Procedure Laterality Date  . ABDOMINAL HYSTERECTOMY    . bladder stretching x6    . BLADDER SURGERY     stimulator placed and stretching   . CHOLECYSTECTOMY    . COLONOSCOPY    . COLONOSCOPY WITH PROPOFOL N/A 09/03/2014   Procedure: COLONOSCOPY WITH PROPOFOL;  Surgeon: Milus Banister, MD;  Location: [facility];  Service: Endoscopy;  Laterality: N/A;  . interstitial cystitis    . PACEMAKER INSERTION     in hip for interstitial cystitis  . removal of uterine cyst and scrapped uterus    . replaced bladder pacemaker     OB History    No data available     Home Medications    Prior to Admission medications   Medication Sig Start Date End Date Taking? Authorizing Provider  ACETAMINOPHEN-CODEINE #4 PO Take by mouth. Take for up tp 10 days. Written 01/17/2016    Historical Provider, MD  albuterol (PROVENTIL HFA;VENTOLIN HFA) 108 (90 Base) MCG/ACT inhaler Inhale 2 puffs into the lungs every 6 (six) hours as needed for wheezing or shortness of breath. 10/18/15   Percell Miller Saguier, PA-C  clonazePAM (KLONOPIN) 1 MG tablet Take 1 tablet (1 mg total) by mouth 2 (two) times daily as needed for anxiety. 02/15/16   Alferd Apa Lowne Chase, DO  escitalopram (LEXAPRO)  20 MG tablet Take 1 tablet (20 mg total) by mouth daily. 11/08/15   Rosalita Chessman Chase, DO  lurasidone (LATUDA) 40 MG TABS tablet Take 1 tablet (40 mg total) by mouth daily with breakfast. 11/08/15   Rosalita Chessman Chase, DO  omeprazole (PRILOSEC) 40 MG capsule Take 40 mg by mouth daily.    Historical Provider, MD  ondansetron (ZOFRAN) 8 MG tablet Take 1 tablet (8 mg total) by mouth every 8 (eight) hours as needed for nausea or vomiting. 10/17/15   Orlie Dakin, MD  traZODone (DESYREL) 50 MG tablet Take 0.5-1 tablets (25-50 mg total) by mouth at bedtime as needed for sleep. 01/18/16   Mackie Pai, PA-C  UNKNOWN TO PATIENT     Historical Provider, MD   Family History Family History  Problem Relation Age of Onset  . Heart disease Father   . Asthma Maternal Grandmother   . Emphysema Maternal Grandfather   . Cancer Maternal Grandfather     Lung Cancer  . Cancer Other  Lung Cancer-Aunt  . Colon cancer Neg Hx   . Esophageal cancer Neg Hx   . Rectal cancer Neg Hx   . Stomach cancer Neg Hx   . Thyroid disease Neg Hx    Social History Social History  Substance Use Topics  . Smoking status: Current Every Day Smoker    Packs/day: 1.00    Years: 14.00    Types: Cigarettes  . Smokeless tobacco: Never Used     Comment: trying to quit  . Alcohol use 0.0 oz/week     Comment: rare wine    Allergies   Abilify [aripiprazole]; Metoclopramide hcl; Propoxyphene; Tramadol; Ambien [zolpidem tartrate]; Eszopiclone; Amitriptyline; Metoclopramide; Other; Varenicline; Buprenorphine hcl; Demerol [meperidine]; Emetrol; Morphine and related; and Propoxyphene n-acetaminophen  Review of Systems Review of Systems  Constitutional: Negative for fever.       Presently hungry  HENT: Positive for congestion.   Respiratory: Positive for cough and shortness of breath.   Gastrointestinal: Positive for nausea and vomiting.  Musculoskeletal: Negative.   Skin: Negative.   Psychiatric/Behavioral: Negative.     All other systems reviewed and are negative.  Physical Exam Updated Vital Signs BP 132/92 (BP Location: Right Arm)   Pulse 85   Temp 98.3 F (36.8 C) (Oral)   Resp (!) 38   Wt 130 lb (59 kg)   SpO2 100%   BMI 22.67 kg/m   Physical Exam  Constitutional: She appears well-developed and well-nourished.  HENT:  Head: Normocephalic and atraumatic.  Eyes: Conjunctivae are normal. Pupils are equal, round, and reactive to light.  Neck: Neck supple. No tracheal deviation present. No thyromegaly present.  Cardiovascular: Normal rate and regular rhythm.   No murmur heard. Pulmonary/Chest: Effort normal.  Diminished breath sounds diffusely and speaks in paragraphs  Abdominal: Soft. Bowel sounds are normal. She exhibits no distension. There is no tenderness.  Musculoskeletal: Normal range of motion. She exhibits no edema or tenderness.  Neurological: She is alert. Coordination normal.  Skin: Skin is warm and dry. No rash noted.  Psychiatric: She has a normal mood and affect.  Nursing note and vitals reviewed.  ED Treatments / Results  DIAGNOSTIC STUDIES: Oxygen Saturation is 100% on RA, normal by my interpretation.   COORDINATION OF CARE: 5:00 PM-Discussed next steps with pt. Pt verbalized understanding and is agreeable with the plan.   Labs (all labs ordered are listed, but only abnormal results are displayed) Labs Reviewed  CBC WITH DIFFERENTIAL/PLATELET - Abnormal; Notable for the following:       Result Value   WBC 11.2 (*)    All other components within normal limits  COMPREHENSIVE METABOLIC PANEL - Abnormal; Notable for the following:    Potassium 2.5 (*)    ALT 7 (*)    Total Bilirubin 0.2 (*)    All other components within normal limits  TROPONIN I   EKG  EKG Interpretation  Date/Time:  Monday April 17 2016 15:38:18 EST Ventricular Rate:  76 PR Interval:    QRS Duration: 72 QT Interval:  399 QTC Calculation: 449 R Axis:   74 Text Interpretation:  Sinus  rhythm Borderline short PR interval Borderline repolarization abnormality Baseline wander in lead(s) III V1 No significant change since last tracing Confirmed by Winfred Leeds  MD, Madalynne Gutmann [phone]) on 04/17/2016 3:45:36 PM      Radiology No results found. Chest x-ray viewed by me Results for orders placed or performed during the hospital encounter of 04/17/16  CBC with Differential  Result Value Ref Range  WBC 11.2 (H) 4.0 - 10.5 K/uL   RBC 4.29 3.87 - 5.11 MIL/uL   Hemoglobin 13.3 12.0 - 15.0 g/dL   HCT 39.9 36.0 - 46.0 %   MCV 93.0 78.0 - 100.0 fL   MCH 31.0 26.0 - 34.0 pg   MCHC 33.3 30.0 - 36.0 g/dL   RDW 12.1 11.5 - 15.5 %   Platelets 348 150 - 400 K/uL   Neutrophils Relative % 67 %   Neutro Abs 7.6 1.7 - 7.7 K/uL   Lymphocytes Relative 25 %   Lymphs Abs 2.8 0.7 - 4.0 K/uL   Monocytes Relative 8 %   Monocytes Absolute 0.8 0.1 - 1.0 K/uL   Eosinophils Relative 0 %   Eosinophils Absolute 0.0 0.0 - 0.7 K/uL   Basophils Relative 0 %   Basophils Absolute 0.0 0.0 - 0.1 K/uL  Comprehensive metabolic panel  Result Value Ref Range   Sodium 138 135 - 145 mmol/L   Potassium 2.5 (LL) 3.5 - 5.1 mmol/L   Chloride 106 101 - 111 mmol/L   CO2 22 22 - 32 mmol/L   Glucose, Bld 97 65 - 99 mg/dL   BUN 6 6 - 20 mg/dL   Creatinine, Ser 0.73 0.44 - 1.00 mg/dL   Calcium 9.6 8.9 - 10.3 mg/dL   Total Protein 7.3 6.5 - 8.1 g/dL   Albumin 4.1 3.5 - 5.0 g/dL   AST 22 15 - 41 U/L   ALT 7 (L) 14 - 54 U/L   Alkaline Phosphatase 81 38 - 126 U/L   Total Bilirubin 0.2 (L) 0.3 - 1.2 mg/dL   GFR calc non Af Amer >60 >60 mL/min   GFR calc Af Amer >60 >60 mL/min   Anion gap 10 5 - 15  Troponin I  Result Value Ref Range   Troponin I <0.03 <0.03 ng/mL  Magnesium  Result Value Ref Range   Magnesium 1.7 1.7 - 2.4 mg/dL  Basic metabolic panel  Result Value Ref Range   Sodium 139 135 - 145 mmol/L   Potassium 2.8 (L) 3.5 - 5.1 mmol/L   Chloride 108 101 - 111 mmol/L   CO2 22 22 - 32 mmol/L   Glucose, Bld 130  (H) 65 - 99 mg/dL   BUN 6 6 - 20 mg/dL   Creatinine, Ser 0.85 0.44 - 1.00 mg/dL   Calcium 8.8 (L) 8.9 - 10.3 mg/dL   GFR calc non Af Amer >60 >60 mL/min   GFR calc Af Amer >60 >60 mL/min   Anion gap 9 5 - 15   Dg Chest 2 View  Result Date: 04/17/2016 CLINICAL DATA:  Cough.  Shortness of breath and chest tightness. EXAM: CHEST  2 VIEW COMPARISON:  10/18/2015 FINDINGS: Heart size appears normal. No pleural effusion or edema. The lungs are hyperinflated with coarsened interstitial markings of emphysema. No superimposed airspace consolidation. IMPRESSION: 1. No acute cardiopulmonary abnormalities. 2. COPD/emphysema. Electronically Signed   By: Kerby Moors M.D.   On: 04/17/2016 17:18   Dg Lumbar Spine Complete  Result Date: 03/22/2016 CLINICAL DATA:  41 year old female with a history of lumbar back pain EXAM: LUMBAR SPINE - COMPLETE 4+ VIEW COMPARISON:  None. FINDINGS: Lumbar Spine: Lumbar vertebral elements maintain normal alignment without evidence of anterolisthesis, retrolisthesis, subluxation. No fracture line identified. Vertebral body heights maintained as well as disc space heights. No significant degenerative disc disease or endplate changes. No significant facet changes. Surgical changes of cholecystectomy. Generator within the soft tissues overlying the left gluteal  region with leads in place in the pelvis, unchanged. Oblique images demonstrate no displaced pars defect. IMPRESSION: No acute fracture malalignment of the lumbar spine. Signed, Dulcy Fanny. Earleen Newport, DO [facility] Electronically Signed   By: Corrie Mckusick D.O.   On: 03/22/2016 19:00   Procedures Procedures   Medications Ordered in ED Medications  ondansetron (ZOFRAN) injection 4 mg (4 mg Intravenous Given 04/17/16 1555)   Initial Impression / Assessment and Plan / ED Course  I have reviewed the triage vital signs and the nursing notes.  Pertinent labs & imaging  results that were available during my care of the patient were reviewed by me and considered in my medical decision making (see chart for details).  Clinical Course   Patient breathing normally and able to ambulate without difficulty after treatment with albuterol and prednisone. She also received oral potassium supplement while here. She is no longer nauseated after treatment with antiemetics. She reports that potassium supplements cause her to be nauseated.  Plan prescriptions kdur Zofran, prednisone. She is instructed to use her albuterol HFA 2 puffs every 4 hours needed for shortness of breath. Return if needed more than every 4 hours. Follow-up with Dr.Lowne to get serum potassium level rechecked next week. I counseled patient for 5 minutes on smoking cessation Final Clinical Impressions(s) / ED Diagnoses  Diagnosis #1 exacerbation of COPD #2 hypokalemia #3 tobacco abuse Final diagnoses:  None   New Prescriptions New Prescriptions   No medications on file   I personally performed the services described in this documentation, which was scribed in my presence. The recorded information has been reviewed and considered.    Orlie Dakin, MD 04/17/16 2011

## 2016-04-17 NOTE — Telephone Encounter (Addendum)
Received call from [facility] who states patient called in with complaints of SOB,Chest Pain, Chest Congestion,Lightheadedness,Shaking hands,Coughing up green mucous( when it comes up). Patient refuses to go to ED would rather come in to office.

## 2016-04-17 NOTE — Telephone Encounter (Signed)
Please call pt back to make sure she went

## 2016-04-17 NOTE — Telephone Encounter (Signed)
Per chart, pt went to the ER as advised.   

## 2016-04-17 NOTE — Telephone Encounter (Signed)
Called to follow up with patient.  Could tell over the phone that patient was moderately short of breath.  She was able to speak in complete sentences but with the need to catch her breath occasionally.  No audible wheezing noted.  States she has been using albuterol inhaler 4-5 times per day. Feels like a small weight on chest.  Pt states she declined ER disposition due to financial reasons.  Given patient's symptoms and report, she was strongly advised to go to the ER.  Pt agreed to come to ER here at the [facility].  States she was 40 mins away.  She is being accompanied by her son's fiance'.  Daughter-in-law states she appears to be okay to drive and that she is able to control her breathing as long as she doesn't talk.  Daughter-in-law was advised if symptoms worsen to have patient pull over and to call 911.  Daughter-in-law stated understanding and agreed with plan.

## 2016-04-17 NOTE — Telephone Encounter (Signed)
Patient Name: [name]  DOB: November 18, 1975    Initial Comment Caller states having trouble breathing, hands shaking    Nurse Assessment  Nurse: Raphael Gibney, RN, Vera Date/Time (Eastern Time): 04/17/2016 1:46:55 PM  Confirm and document reason for call. If symptomatic, describe symptoms. ---Caller states she has had a chest cold for 2 weeks. She is light headed. Has a lot of chest congestion. She is very SOB. Has COPD and asthma. Sputum is green and thick. SOB gets worse when she exerts herself. has chest discomfort. Her hands are shaking.  Does the patient have any new or worsening symptoms? ---Yes  Will a triage be completed? ---Yes  Related visit to physician within the last 2 weeks? ---No  Does the PT have any chronic conditions? (i.e. diabetes, asthma, etc.) ---Yes  List chronic conditions. ---COPD; asthma  Is the patient pregnant or possibly pregnant? (Ask all females between the ages of 42-55) ---No  Is this a behavioral health or substance abuse call? ---No     Guidelines    Guideline Title Affirmed Question Affirmed Notes  Chest Pain Difficulty breathing    Final Disposition User   Go to ED Now Raphael Gibney, RN, Vanita Ingles    Comments  called office and spoke to Santiago Glad and gave report that pt has COPD and asthma; Has chest congestion and coughing up thick green sputum when she can get it up. Having chest discomfort; She is very SOB; sounded SOB on the phone. She is lightheaded and her hands are shaking. Triage outcome of go to ER but pt does not want to go to the ER.   Referrals  GO TO FACILITY REFUSED   Disagree/Comply: Disagree  Disagree/Comply Reason: Disagree with instructions

## 2016-04-17 NOTE — ED Triage Notes (Signed)
Patient states that he has had SOB all day today with a Hx of COPD - the patient reports that she now is having pain with inspiration in her chest. Patient is "gasping" for breath in triage.

## 2016-04-17 NOTE — Telephone Encounter (Signed)
Noted.  See phone note (04/17/16).

## 2016-04-17 NOTE — ED Notes (Signed)
ED Provider at bedside. 

## 2016-04-17 NOTE — ED Notes (Signed)
Ambulated in dept. HR 108, RR 18, SPo2 98%. No increase WOB noted. Pt tolerated well.

## 2016-04-17 NOTE — Discharge Instructions (Signed)
Use your albuterol inhaler 2 puffs every 4 hours as needed for cough or shortness of breath. Return if needed more than every 4 hours. Call Dr.Lowne's office tomorrow to arrange to be seen in her office next week get your blood potassium rechecked. Ask Dr. Etter Sjogren to help you to stop smoking

## 2016-04-17 NOTE — ED Notes (Signed)
Patient transported to X-ray 

## 2016-04-17 NOTE — Telephone Encounter (Signed)
Left a message on pt's vm according to her symptoms she need to go to the ED asap, and to give the office a call back. LB

## 2016-04-21 ENCOUNTER — Telehealth: Payer: Self-pay | Admitting: Family Medicine

## 2016-04-21 NOTE — Telephone Encounter (Signed)
Patient is calling to request a refill of ondansetron (ZOFRAN) 8 MG tablet Please advise   Pharmacy:[facility] [place], [place] [address]

## 2016-04-24 MED ORDER — ONDANSETRON HCL 8 MG PO TABS: 8.0000 mg | ORAL_TABLET | Freq: Three times a day (TID) | ORAL | 0 refills | Status: DC | PRN

## 2016-04-24 NOTE — Addendum Note (Signed)
Addended by: Magdalene Molly A on: 04/24/2016 11:51 AM   Modules accepted: Orders

## 2016-04-24 NOTE — Telephone Encounter (Signed)
Last seen 01/18/16    Not filled by you  Please advise

## 2016-05-01 NOTE — Telephone Encounter (Addendum)
Relation to pt: self Call back [phone]   Reason for call:  Patient checking on the status of medication refill mentioned below, please advise

## 2016-07-18 ENCOUNTER — Telehealth: Payer: Self-pay

## 2016-07-18 NOTE — Telephone Encounter (Signed)
She does need to call surgeon

## 2016-07-18 NOTE — Telephone Encounter (Signed)
Patient had surgery a few days ago. She stated she now has nausea and the zofran is not working. She stated that when she left they gave her a small patch behind her ear and that helped. She wanted to know what it was so you could refill it for her.    Patient was advised by me to call the physician that did her surgery to get a refill until she had follow up appt w/ Dr Ulyses Jarred ..  Dr. Etter Sjogren does not have another appt until May.  Please advise

## 2016-07-18 NOTE — Telephone Encounter (Signed)
Patient notified.  She did call her surgeon and is waiting to hear back.

## 2016-07-31 ENCOUNTER — Emergency Department (HOSPITAL_BASED_OUTPATIENT_CLINIC_OR_DEPARTMENT_OTHER): Payer: 59

## 2016-07-31 ENCOUNTER — Emergency Department (HOSPITAL_BASED_OUTPATIENT_CLINIC_OR_DEPARTMENT_OTHER)
Admission: EM | Admit: 2016-07-31 | Discharge: 2016-07-31 | Disposition: A | Discharge status: Home / Self Care | Payer: 59 | Attending: Emergency Medicine | Admitting: Emergency Medicine

## 2016-07-31 ENCOUNTER — Encounter (HOSPITAL_BASED_OUTPATIENT_CLINIC_OR_DEPARTMENT_OTHER): Payer: Self-pay | Admitting: Radiology

## 2016-07-31 DIAGNOSIS — Z79899 Other long term (current) drug therapy: Secondary | ICD-10-CM | POA: Diagnosis not present

## 2016-07-31 DIAGNOSIS — W109XXA Fall (on) (from) unspecified stairs and steps, initial encounter: Secondary | ICD-10-CM | POA: Diagnosis not present

## 2016-07-31 DIAGNOSIS — J449 Chronic obstructive pulmonary disease, unspecified: Secondary | ICD-10-CM | POA: Diagnosis not present

## 2016-07-31 DIAGNOSIS — Y999 Unspecified external cause status: Secondary | ICD-10-CM | POA: Insufficient documentation

## 2016-07-31 DIAGNOSIS — F17210 Nicotine dependence, cigarettes, uncomplicated: Secondary | ICD-10-CM | POA: Diagnosis not present

## 2016-07-31 DIAGNOSIS — J45909 Unspecified asthma, uncomplicated: Secondary | ICD-10-CM | POA: Insufficient documentation

## 2016-07-31 DIAGNOSIS — I10 Essential (primary) hypertension: Secondary | ICD-10-CM | POA: Diagnosis not present

## 2016-07-31 DIAGNOSIS — Y9301 Activity, walking, marching and hiking: Secondary | ICD-10-CM | POA: Diagnosis not present

## 2016-07-31 DIAGNOSIS — R1031 Right lower quadrant pain: Secondary | ICD-10-CM | POA: Diagnosis not present

## 2016-07-31 DIAGNOSIS — R109 Unspecified abdominal pain: Secondary | ICD-10-CM

## 2016-07-31 DIAGNOSIS — Y929 Unspecified place or not applicable: Secondary | ICD-10-CM | POA: Diagnosis not present

## 2016-07-31 DIAGNOSIS — S3991XA Unspecified injury of abdomen, initial encounter: Secondary | ICD-10-CM | POA: Diagnosis present

## 2016-07-31 DIAGNOSIS — W108XXA Fall (on) (from) other stairs and steps, initial encounter: Secondary | ICD-10-CM

## 2016-07-31 LAB — CBC WITH DIFFERENTIAL/PLATELET
Basophils Absolute: 0 10*3/uL (ref 0.0–0.1)
Basophils Relative: 0 %
Eosinophils Absolute: 0.1 10*3/uL (ref 0.0–0.7)
Eosinophils Relative: 1 %
HCT: 36.7 % (ref 36.0–46.0)
Hemoglobin: 12.5 g/dL (ref 12.0–15.0)
Lymphocytes Relative: 39 %
Lymphs Abs: 3.3 10*3/uL (ref 0.7–4.0)
MCH: 31.3 pg (ref 26.0–34.0)
MCHC: 34.1 g/dL (ref 30.0–36.0)
MCV: 92 fL (ref 78.0–100.0)
Monocytes Absolute: 0.4 10*3/uL (ref 0.1–1.0)
Monocytes Relative: 5 %
Neutro Abs: 4.5 10*3/uL (ref 1.7–7.7)
Neutrophils Relative %: 55 %
Platelets: 280 10*3/uL (ref 150–400)
RBC: 3.99 MIL/uL (ref 3.87–5.11)
RDW: 11.8 % (ref 11.5–15.5)
WBC: 8.4 10*3/uL (ref 4.0–10.5)

## 2016-07-31 LAB — COMPREHENSIVE METABOLIC PANEL
ALT: 7 U/L — ABNORMAL LOW (ref 14–54)
AST: 14 U/L — ABNORMAL LOW (ref 15–41)
Albumin: 3.7 g/dL (ref 3.5–5.0)
Alkaline Phosphatase: 73 U/L (ref 38–126)
Anion gap: 6 (ref 5–15)
BUN: 9 mg/dL (ref 6–20)
CO2: 25 mmol/L (ref 22–32)
Calcium: 9.2 mg/dL (ref 8.9–10.3)
Chloride: 106 mmol/L (ref 101–111)
Creatinine, Ser: 0.71 mg/dL (ref 0.44–1.00)
GFR calc Af Amer: >60 mL/min (ref >60)
GFR calc non Af Amer: >60 mL/min (ref >60)
Glucose, Bld: 96 mg/dL (ref 65–99)
Potassium: 3.4 mmol/L — ABNORMAL LOW (ref 3.5–5.1)
Sodium: 137 mmol/L (ref 135–145)
Total Bilirubin: 0.3 mg/dL (ref 0.3–1.2)
Total Protein: 6.5 g/dL (ref 6.5–8.1)

## 2016-07-31 MED ORDER — LIDOCAINE 5 % EX PTCH: 1.0000 | MEDICATED_PATCH | CUTANEOUS | 0 refills | Status: DC

## 2016-07-31 MED ORDER — HYDROMORPHONE HCL 1 MG/ML IJ SOLN
1.0000 mg | Freq: Once | INTRAMUSCULAR | Status: AC
  Administered 2016-07-31: 1 mg via INTRAVENOUS
  Filled 2016-07-31: qty 1

## 2016-07-31 MED ORDER — IOPAMIDOL (ISOVUE-300) INJECTION 61%
100.0000 mL | Freq: Once | INTRAVENOUS | Status: AC | PRN
  Administered 2016-07-31: 100 mL via INTRAVENOUS

## 2016-07-31 MED ORDER — METHOCARBAMOL 500 MG PO TABS: 500.0000 mg | ORAL_TABLET | Freq: Four times a day (QID) | ORAL | 0 refills | Status: DC | PRN

## 2016-07-31 MED FILL — METHOCARBAMOL 500 MG TABLET: 500 | 2 days supply | Qty: 10 | Fill #0

## 2016-07-31 NOTE — ED Notes (Signed)
Patient transported to CT 

## 2016-07-31 NOTE — Discharge Instructions (Signed)
Read the information below.  Use the prescribed medication as directed.  Please discuss all new medications with your pharmacist.  You may return to the Emergency Department at any time for worsening condition or any new symptoms that concern you.   If you develop high fevers, worsening abdominal pain, uncontrolled vomiting, or are unable to tolerate fluids by mouth, return to the ER for a recheck.  ° °

## 2016-07-31 NOTE — ED Provider Notes (Signed)
[name] DEPT [facility] Provider Note   CSN: [id number] Arrival date & time: 07/31/16  1413     History   Chief Complaint Chief Complaint  Patient presents with  . Abdominal Pain  . Fall    HPI [name] is a 41 y.o. female.  HPI   Patient with hx recent bladder stretching procedure 07/18/16 p/w right sided abdominal pain after slipping on a wet step and falling onto her right abdomen on the stairs.  This occurred approximately 2 hours ago.  The pain is severe and constant.  States she also fell onto her abdomen 4 days ago and was dragged by the dog.  Denies fevers, urinary or vaginal symptoms, bowel changes.  Urologist is Dr Amalia Hailey, [facility].    s/p total hysterectomy   She is provided Tylenol 4 from her doctor at [facility], recently decreased from vicodin.    Past Medical History:  Diagnosis Date  . 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 Anal fissure 03/11/2009  . Anemia   . Anxiety   . Anxiety and depression   . ARTHRITIS 08/31/2007  . Arthritis   . Asthma   . 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 Bipolar 1 disorder ([place])   . Bowel obstruction ([place])   . BRONCHITIS, RECURRENT 08/23/2009   Asthmatic Bronchitis-Dr. Melvyn Novas.....-HFA 75% 12/04/2008>75% 02/05/2009>75% 08/04/2009 -PFT's 01/04/2009 2.56 (86%) ratio 75, no resp to B2 and DLC0 67% > 80 after correction   . Cancer (HCC)    cervical cancer  . Chronic interstitial cystitis 03/11/2009  . Chronic nausea   . Chronic pain   . Colon polyps   . COLONIC POLYPS, HX OF 07/25/2006   ADENOMATOUS POLYP  . COPD (chronic obstructive pulmonary disease) ([place])   . DEPRESSION 08/31/2007  . Endometriosis   . FIBROMYALGIA 08/31/2007  . Fibromyalgia   . GERD 02/05/2009  . Hyperlipidemia   . HYPERTENSION 08/31/2007  . IBS 03/11/2009  . Internal hemorrhoids   . Migraine headache   . 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 PONV (postoperative nausea and vomiting)   . RECTAL BLEEDING 03/11/2009  . Seizures ([place])    been about 1  year since last seisure per pt  . SLEEP APNEA 08/31/2007  . Thyroid disease   . Uterine cyst     Patient Active Problem List   Diagnosis Date Noted  . Hyperthyroidism 02/17/2015  . Anxiety 01/28/2015  . Clinical depression 01/28/2015  . COPD exacerbation ([place]) 11/26/2013  . Vaginitis and vulvovaginitis 11/26/2013  . Recurrent pneumonia 02/02/2013  . Left arm pain 01/13/2013  . Left arm pain 01/13/2013    Class: Acute  . Dizziness and giddiness 01/13/2013  . Acute bronchitis 12/07/2012  . Rib pain 12/07/2012  . Insomnia 08/26/2012  . Diarrhea 06/19/2012  . Heme positive stool 06/19/2012  . Bloating 06/19/2012  . Nausea alone 06/19/2012  . Bladder retention 04/24/2012  . Tremor 03/05/2012  . Dysuria 03/05/2012  . Failure to thrive in adult 06/21/2011  . Airway hyperreactivity 03/08/2011  . Back pain, chronic 03/08/2011  . Chronic obstructive pulmonary disease ([place]) 03/08/2011  . Acid reflux 03/08/2011  . Cystitis 01/22/2011  . Bilateral hand numbness 01/13/2011  . WEIGHT LOSS 06/24/2010  . RIB PAIN, LEFT SIDED 05/04/2010  . ABDOMINAL PAIN, LEFT UPPER QUADRANT 05/04/2010  . Unspecified otitis media 10/26/2009  . BRONCHITIS, RECURRENT 08/23/2009  . URI, ACUTE 08/04/2009  . FATIGUE 07/06/2009  . IBS 03/11/2009  . ANAL FISSURE 03/11/2009  . RECTAL BLEEDING 03/11/2009  . Chronic interstitial  cystitis 03/11/2009  . COLONIC POLYPS, HX OF 03/11/2009  . GERD 02/05/2009  . SMOKER 12/04/2008  . chronic asthma poorly controlled 12/04/2008  . 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 Bipolar 1 disorder ([place]) 08/31/2007  . HYPERTENSION 08/31/2007  . EMPHYSEMA 08/31/2007  . 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 ARTHRITIS 08/31/2007  . FIBROMYALGIA 08/31/2007  . SLEEP APNEA 08/31/2007    Past Surgical History:  Procedure Laterality Date  . ABDOMINAL HYSTERECTOMY    . bladder stretching x6    . BLADDER SURGERY     stimulator placed and  stretching   . CHOLECYSTECTOMY    . COLONOSCOPY    . COLONOSCOPY WITH PROPOFOL N/A 09/03/2014   Procedure: COLONOSCOPY WITH PROPOFOL;  Surgeon: Milus Banister, MD;  Location: [facility];  Service: Endoscopy;  Laterality: N/A;  . interstitial cystitis    . PACEMAKER INSERTION     in hip for interstitial cystitis  . removal of uterine cyst and scrapped uterus    . replaced bladder pacemaker      OB History    No data available       Home Medications    Prior to Admission medications   Medication Sig Start Date End Date Taking? Authorizing Provider  ACETAMINOPHEN-CODEINE #4 PO Take by mouth. Take for up tp 10 days. Written 01/17/2016    Historical Provider, MD  albuterol (PROVENTIL HFA;VENTOLIN HFA) 108 (90 Base) MCG/ACT inhaler Inhale 2 puffs into the lungs every 6 (six) hours as needed for wheezing or shortness of breath. 10/18/15   Percell Miller Saguier, PA-C  clonazePAM (KLONOPIN) 1 MG tablet Take 1 tablet (1 mg total) by mouth 2 (two) times daily as needed for anxiety. 02/15/16   Alferd Apa Lowne Chase, DO  escitalopram (LEXAPRO) 20 MG tablet Take 1 tablet (20 mg total) by mouth daily. 11/08/15   Rosalita Chessman Chase, DO  lidocaine (LIDODERM) 5 % Place 1 patch onto the skin daily. Remove & Discard patch within 12 hours or as directed by MD 07/31/16   Clayton Bibles, PA-C  lurasidone (LATUDA) 40 MG TABS tablet Take 1 tablet (40 mg total) by mouth daily with breakfast. 11/08/15   Rosalita Chessman Chase, DO  methocarbamol (ROBAXIN) 500 MG tablet Take 1-2 tablets (500-1,000 mg total) by mouth every 6 (six) hours as needed for muscle spasms (and pain). 07/31/16   Clayton Bibles, PA-C  omeprazole (PRILOSEC) 40 MG capsule Take 40 mg by mouth daily.    Historical Provider, MD  ondansetron (ZOFRAN) 8 MG tablet Take 1 tablet (8 mg total) by mouth every 8 (eight) hours as needed for nausea or vomiting. 04/24/16   Rosalita Chessman Chase, DO  potassium chloride SA (K-DUR,KLOR-CON) 20 MEQ tablet Take 1 tablet (20 mEq total)  by mouth 2 (two) times daily. 04/17/16   Orlie Dakin, MD  predniSONE (DELTASONE) 20 MG tablet 2 tabs po daily x 4 days starting 04/18/16 04/17/16   Orlie Dakin, MD  traZODone (DESYREL) 50 MG tablet Take 0.5-1 tablets (25-50 mg total) by mouth at bedtime as needed for sleep. 01/18/16   Mackie Pai, PA-C  UNKNOWN TO PATIENT     Historical Provider, MD    Family History Family History  Problem Relation Age of Onset  . Heart disease Father   . Asthma Maternal Grandmother   . Emphysema Maternal Grandfather   . Cancer Maternal Grandfather     Lung Cancer  . Cancer Other     Lung Cancer-Aunt  .  Colon cancer Neg Hx   . Esophageal cancer Neg Hx   . Rectal cancer Neg Hx   . Stomach cancer Neg Hx   . Thyroid disease Neg Hx     Social History Social History  Substance Use Topics  . Smoking status: Current Every Day Smoker    Packs/day: 1.00    Years: 14.00    Types: Cigarettes  . Smokeless tobacco: Never Used     Comment: trying to quit  . Alcohol use 0.0 oz/week     Comment: rare wine      Allergies   Abilify [aripiprazole]; Metoclopramide hcl; Propoxyphene; Tramadol; Ambien [zolpidem tartrate]; Eszopiclone; Amitriptyline; Metoclopramide; Other; Varenicline; Buprenorphine hcl; Demerol [meperidine]; Emetrol; Morphine and related; and Propoxyphene n-acetaminophen   Review of Systems Review of Systems  All other systems reviewed and are negative.    Physical Exam Updated Vital Signs BP 101/69   Pulse 70   Temp 98.1 F (36.7 C) (Oral)   Resp 14   SpO2 95%   Physical Exam  Constitutional: She appears well-developed and well-nourished. No distress.  HENT:  Head: Normocephalic and atraumatic.  Neck: Neck supple.  Cardiovascular: Normal rate and regular rhythm.   Pulmonary/Chest: Effort normal and breath sounds normal. No respiratory distress. She has no wheezes. She has no rales.  Abdominal: Soft. She exhibits no distension. There is tenderness (RLQ). There is rebound.  There is no guarding.  Neurological: She is alert.  Skin: She is not diaphoretic.  Nursing note and vitals reviewed.    ED Treatments / Results  Labs (all labs ordered are listed, but only abnormal results are displayed) Labs Reviewed  COMPREHENSIVE METABOLIC PANEL - Abnormal; Notable for the following:       Result Value   Potassium 3.4 (*)    AST 14 (*)    ALT 7 (*)    All other components within normal limits  CBC WITH DIFFERENTIAL/PLATELET    EKG  EKG Interpretation None       Radiology Ct Abdomen Pelvis W Contrast  Result Date: 07/31/2016 CLINICAL DATA:  Right abdominal pain, fall down steps EXAM: CT ABDOMEN AND PELVIS WITH CONTRAST TECHNIQUE: Multidetector CT imaging of the abdomen and pelvis was performed using the standard protocol following bolus administration of intravenous contrast. CONTRAST:  1102mL ISOVUE-300 IOPAMIDOL (ISOVUE-300) INJECTION 61% COMPARISON:  CT abdomen dated 01/28/2015 FINDINGS: Lower chest: Lung bases are clear. Hepatobiliary: Liver is within normal limits. Status post cholecystectomy. No intrahepatic duct dilatation. Dilated common duct centrally, measuring 16 mm (series 5/ image 21), previously 9 mm, although smoothly tapering at the ampulla. Pancreas: Within normal limits. Spleen: Within normal limits. Adrenals/Urinary Tract: Adrenal glands are within normal limits. Kidneys are within normal limits.  No hydronephrosis. Bladder is within normal limits. Stomach/Bowel: Stomach is within normal limits. No evidence of bowel obstruction. Normal appendix (series 2/ image 62). Vascular/Lymphatic: No evidence of abdominal aortic aneurysm. No suspicious abdominopelvic lymphadenopathy. Reproductive: Status post hysterectomy. No adnexal masses. Other: No abdominopelvic ascites. Musculoskeletal: Visualized osseous structures are within normal limits. No fracture is seen. Sacral stimulator. IMPRESSION: No evidence traumatic injury to the abdomen/ pelvis. Status post  cholecystectomy. Dilated common duct, measuring 16 mm, although smoothly tapering at the ampulla and likely postsurgical in the setting of normal LFTs. Additional ancillary findings as above. Electronically Signed   By: Julian Hy M.D.   On: 07/31/2016 15:45    Procedures Procedures (including critical care time)  Medications Ordered in ED Medications  HYDROmorphone (DILAUDID) injection 1  mg (1 mg Intravenous Given 07/31/16 1512)  iopamidol (ISOVUE-300) 61 % injection 100 mL (100 mLs Intravenous Contrast Given 07/31/16 1515)     Initial Impression / Assessment and Plan / ED Course  I have reviewed the triage vital signs and the nursing notes.  Pertinent labs & imaging results that were available during my care of the patient were reviewed by me and considered in my medical decision making (see chart for details).     Afebrile, nontoxic patient with abdominal pain after falling onto her abdomen today.  Also notes abdominal pain for several days after falling onto her abdomen while walking her dog.  She has no associated symptoms.  CT abd/pelvis is negative.  Pt specifically requested vicodin for home, though I do not think this is appropriate.  Noted that patient is given Tylenol 4 for her pain but was previously given vicodin.  D/C home with lidoderm patch, robaxin (#10), PCP follow up.  Discussed result, findings, treatment, and follow up  with patient.  Pt given return precautions.  Pt verbalizes understanding and agrees with plan.       Final Clinical Impressions(s) / ED Diagnoses   Final diagnoses:  Abdominal pain, unspecified abdominal location  Fall (on) (from) other stairs and steps, initial encounter    New Prescriptions New Prescriptions   LIDOCAINE (LIDODERM) 5 %    Place 1 patch onto the skin daily. Remove & Discard patch within 12 hours or as directed by MD   METHOCARBAMOL (ROBAXIN) 500 MG TABLET    Take 1-2 tablets (500-1,000 mg total) by mouth every 6 (six) hours as  needed for muscle spasms (and pain).     Clayton Bibles, PA-C 07/31/16 1639    Orlie Dakin, MD 08/01/16 1057

## 2016-07-31 NOTE — ED Triage Notes (Signed)
States she was walking up steps today and fell. Injury to her right lower abdominal quadrant.

## 2016-07-31 NOTE — ED Notes (Signed)
Patient hold her lower pelvic region in pain. Patient reports recent bladder surgery 13 days ago, patient denies any urine prior to arrival.

## 2016-09-01 ENCOUNTER — Ambulatory Visit (INDEPENDENT_AMBULATORY_CARE_PROVIDER_SITE_OTHER): Payer: 59 | Admitting: Family Medicine

## 2016-09-01 ENCOUNTER — Encounter: Payer: Self-pay | Admitting: Family Medicine

## 2016-09-01 VITALS — BP 99/74 | HR 105 | Temp 97.9°F | Resp 16 | Ht 64.0 in | Wt 94.0 lb

## 2016-09-01 DIAGNOSIS — R110 Nausea: Secondary | ICD-10-CM | POA: Diagnosis not present

## 2016-09-01 DIAGNOSIS — J440 Chronic obstructive pulmonary disease with acute lower respiratory infection: Secondary | ICD-10-CM

## 2016-09-01 DIAGNOSIS — R300 Dysuria: Secondary | ICD-10-CM

## 2016-09-01 DIAGNOSIS — J209 Acute bronchitis, unspecified: Secondary | ICD-10-CM | POA: Diagnosis not present

## 2016-09-01 LAB — POC URINALSYSI DIPSTICK (AUTOMATED)
Bilirubin, UA: NEGATIVE
Blood, UA: NEGATIVE
Glucose, UA: NEGATIVE
Ketones, UA: NEGATIVE
Leukocytes, UA: NEGATIVE
Nitrite, UA: NEGATIVE
Protein, UA: NEGATIVE
Spec Grav, UA: 1.015 (ref 1.010–1.025)
Urobilinogen, UA: 0.2 E.U./dL
pH, UA: 6 (ref 5.0–8.0)

## 2016-09-01 MED ORDER — TIOTROPIUM BROMIDE MONOHYDRATE 18 MCG IN CAPS: 18.0000 ug | ORAL_CAPSULE | Freq: Every day | RESPIRATORY_TRACT | 12 refills | Status: DC

## 2016-09-01 MED ORDER — ALBUTEROL SULFATE HFA 108 (90 BASE) MCG/ACT IN AERS: 2.0000 | INHALATION_SPRAY | Freq: Four times a day (QID) | RESPIRATORY_TRACT | 5 refills | Status: DC | PRN

## 2016-09-01 MED ORDER — BECLOMETHASONE DIPROPIONATE 40 MCG/ACT IN AERS: 2.0000 | INHALATION_SPRAY | Freq: Two times a day (BID) | RESPIRATORY_TRACT | 12 refills | Status: DC

## 2016-09-01 MED ORDER — SCOPOLAMINE 1 MG/3DAYS TD PT72: 1.0000 | MEDICATED_PATCH | TRANSDERMAL | 12 refills | Status: DC

## 2016-09-01 MED ORDER — IPRATROPIUM-ALBUTEROL 0.5-2.5 (3) MG/3ML IN SOLN
3.0000 mL | Freq: Once | RESPIRATORY_TRACT | Status: AC
  Administered 2016-09-01: 3 mL via RESPIRATORY_TRACT

## 2016-09-01 NOTE — Patient Instructions (Signed)

## 2016-09-01 NOTE — Progress Notes (Signed)
Patient ID: [name], female   DOB: 1975/09/10, 41 y.o.   MRN: [id number]     Subjective:  I acted as a Education administrator for Dr. Carollee Herter.  [name], [name]   Patient ID: [name], female    DOB: Dec 11, 1975, 41 y.o.   MRN: [id number]  Chief Complaint  Patient presents with  . COPD    spiriva  . Nausea    would like to have patches instead of pills  . Dysuria    HPI  Patient is in today for copd, dysuria, and nausea.  She would like refills on her Spiriva.  She has been having some dysuria.  She also would like patches for her nausea instead of the pills.  She states they work better.   Patient Care Team: Carollee Herter, Alferd Apa, DO as PCP - General Marisue Ivan, MD as Referring Physician (Urology)   Past Medical History:  Diagnosis Date  . 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 Anal fissure 03/11/2009  . Anemia   . Anxiety   . Anxiety and depression   . ARTHRITIS 08/31/2007  . Arthritis   . Asthma   . 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 Bipolar 1 disorder ([place])   . Bowel obstruction ([place])   . BRONCHITIS, RECURRENT 08/23/2009   Asthmatic Bronchitis-Dr. Melvyn Novas.....-HFA 75% 12/04/2008>75% 02/05/2009>75% 08/04/2009 -PFT's 01/04/2009 2.56 (86%) ratio 75, no resp to B2 and DLC0 67% > 80 after correction   . Cancer (HCC)    cervical cancer  . Chronic interstitial cystitis 03/11/2009  . Chronic nausea   . Chronic pain   . Colon polyps   . COLONIC POLYPS, HX OF 07/25/2006   ADENOMATOUS POLYP  . COPD (chronic obstructive pulmonary disease) ([place])   . DEPRESSION 08/31/2007  . Endometriosis   . FIBROMYALGIA 08/31/2007  . Fibromyalgia   . GERD 02/05/2009  . Hyperlipidemia   . HYPERTENSION 08/31/2007  . IBS 03/11/2009  . Internal hemorrhoids   . Migraine headache   . 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 PONV (postoperative nausea and vomiting)   . RECTAL BLEEDING 03/11/2009  . Seizures ([place])    been about 1 year since last seisure per pt  . SLEEP APNEA 08/31/2007  . Thyroid disease   .  Uterine cyst     Past Surgical History:  Procedure Laterality Date  . ABDOMINAL HYSTERECTOMY    . bladder stretching x6    . BLADDER SURGERY     stimulator placed and stretching   . CHOLECYSTECTOMY    . COLONOSCOPY    . COLONOSCOPY WITH PROPOFOL N/A 09/03/2014   Procedure: COLONOSCOPY WITH PROPOFOL;  Surgeon: Milus Banister, MD;  Location: [facility];  Service: Endoscopy;  Laterality: N/A;  . interstitial cystitis    . PACEMAKER INSERTION     in hip for interstitial cystitis  . removal of uterine cyst and scrapped uterus    . replaced bladder pacemaker      Family History  Problem Relation Age of Onset  . Heart disease Father   . Asthma Maternal Grandmother   . Emphysema Maternal Grandfather   . Cancer Maternal Grandfather        Lung Cancer  . Cancer Other        Lung Cancer-Aunt  . Colon cancer Neg Hx   . Esophageal cancer Neg Hx   . Rectal cancer Neg Hx   . Stomach cancer Neg Hx   . Thyroid disease Neg Hx     Social History   Social  History  . Marital status: Married    Spouse name: N/A  . Number of children: 2  . Years of education: N/A   Occupational History  .  Unemployed   Social History Main Topics  . Smoking status: Current Every Day Smoker    Packs/day: 1.00    Years: 14.00    Types: Cigarettes  . Smokeless tobacco: Never Used     Comment: trying to quit  . Alcohol use 0.0 oz/week     Comment: rare wine   . Drug use: Yes    Types: Marijuana     Comment: once a month  . Sexual activity: Not on file   Other Topics Concern  . Not on file   Social History Narrative   Homemaker   Daily Caffeine Use-[employer]          Outpatient Medications Prior to Visit  Medication Sig Dispense Refill  . ACETAMINOPHEN-CODEINE #4 PO Take by mouth. Take for up tp 10 days. Written 01/17/2016    . clonazePAM (KLONOPIN) 1 MG tablet Take 1 tablet (1 mg total) by mouth 2 (two) times daily as needed for anxiety. 60 tablet 0  . escitalopram (LEXAPRO) 20 MG  tablet Take 1 tablet (20 mg total) by mouth daily. 30 tablet 5  . lidocaine (LIDODERM) 5 % Place 1 patch onto the skin daily. Remove & Discard patch within 12 hours or as directed by MD 30 patch 0  . lurasidone (LATUDA) 40 MG TABS tablet Take 1 tablet (40 mg total) by mouth daily with breakfast. 30 tablet 2  . methocarbamol (ROBAXIN) 500 MG tablet Take 1-2 tablets (500-1,000 mg total) by mouth every 6 (six) hours as needed for muscle spasms (and pain). 10 tablet 0  . omeprazole (PRILOSEC) 40 MG capsule Take 40 mg by mouth daily.    . potassium chloride SA (K-DUR,KLOR-CON) 20 MEQ tablet Take 1 tablet (20 mEq total) by mouth 2 (two) times daily. 14 tablet 0  . traZODone (DESYREL) 50 MG tablet Take 0.5-1 tablets (25-50 mg total) by mouth at bedtime as needed for sleep. 30 tablet 0  . UNKNOWN TO PATIENT     . albuterol (PROVENTIL HFA;VENTOLIN HFA) 108 (90 Base) MCG/ACT inhaler Inhale 2 puffs into the lungs every 6 (six) hours as needed for wheezing or shortness of breath. 1 Inhaler 0  . ondansetron (ZOFRAN) 8 MG tablet Take 1 tablet (8 mg total) by mouth every 8 (eight) hours as needed for nausea or vomiting. 4 tablet 0  . predniSONE (DELTASONE) 20 MG tablet 2 tabs po daily x 4 days starting 04/18/16 8 tablet 0   No facility-administered medications prior to visit.     Allergies  Allergen Reactions  . Abilify [Aripiprazole] Swelling, Other (See Comments) and Palpitations    tremors Throat swelling, tremors  . Metoclopramide Hcl Other (See Comments)    Causes seizures  . Propoxyphene Rash  . Tramadol Swelling, Other (See Comments) and Rash    Throat swelling, tremors  . Ambien [Zolpidem Tartrate] Other (See Comments)    hallucinations  . Eszopiclone Other (See Comments)    Hallucinations, hyper, bad taste in mouth   . Amitriptyline     Other reaction(s): Angioedema (ALLERGY/intolerance)  . Metoclopramide     Other reaction(s): Other (See Comments) Seizures  . Other Swelling     Acetaminophen #3 Acetaminophen #3  . Varenicline Other (See Comments)    Suicidal thoughts  . Buprenorphine Hcl Itching and Hives  . Demerol [Meperidine]  Rash  . Emetrol Itching, Rash and Hives    Other reaction(s): Rash (ALLERGY/intolerance)  . Morphine And Related Hives and Itching  . Propoxyphene N-Acetaminophen Hives, Itching and Rash    Other reaction(s): Rash (ALLERGY/intolerance)    ROS     Objective:    Physical Exam  Constitutional: She is oriented to person, place, and time. She appears well-developed and well-nourished.  HENT:  Head: Normocephalic and atraumatic.  Eyes: Conjunctivae and EOM are normal.  Neck: Normal range of motion. Neck supple. No JVD present. Carotid bruit is not present. No thyromegaly present.  Cardiovascular: Normal rate, regular rhythm and normal heart sounds.   No murmur heard. Pulmonary/Chest: Effort normal. No respiratory distress. She has wheezes. She has no rales. She exhibits no tenderness.  Neb given and breath sounds sig improved after treatment  Musculoskeletal: She exhibits no edema.  Neurological: She is alert and oriented to person, place, and time.  Psychiatric: She has a normal mood and affect. Her behavior is normal. Judgment and thought content normal.  Nursing note and vitals reviewed.   BP 99/74 (BP Location: Left Arm, Cuff Size: Normal)   Pulse (!) 105   Temp 97.9 F (36.6 C) (Oral)   Resp 16   Ht 5\' 4"  (1.626 m)   Wt 94 lb (42.6 kg)   SpO2 96%   BMI 16.14 kg/m  Wt Readings from Last 3 Encounters:  09/01/16 94 lb (42.6 kg)  04/17/16 130 lb (59 kg)  03/22/16 130 lb (59 kg)   BP Readings from Last 3 Encounters:  09/01/16 99/74  07/31/16 101/69  04/17/16 116/72     Immunization History  Administered Date(s) Administered  . Tdap 05/05/2015    Health Maintenance  Topic Date Due  . HIV Screening  04/24/1991  . INFLUENZA VACCINE  11/08/2016  . PAP SMEAR  09/24/2017  . COLONOSCOPY  09/03/2019  .  TETANUS/TDAP  05/04/2025    Lab Results  Component Value Date   WBC 8.4 07/31/2016   HGB 12.5 07/31/2016   HCT 36.7 07/31/2016   PLT 280 07/31/2016   GLUCOSE 96 07/31/2016   CHOL 127 07/19/2009   TRIG 130.0 07/19/2009   HDL 42.80 07/19/2009   LDLCALC 58 07/19/2009   ALT 7 (L) 07/31/2016   AST 14 (L) 07/31/2016   NA 137 07/31/2016   K 3.4 (L) 07/31/2016   CL 106 07/31/2016   CREATININE 0.71 07/31/2016   BUN 9 07/31/2016   CO2 25 07/31/2016   TSH 1.39 06/03/2015   HGBA1C 4.9 08/10/2014    Lab Results  Component Value Date   TSH 1.39 06/03/2015   Lab Results  Component Value Date   WBC 8.4 07/31/2016   HGB 12.5 07/31/2016   HCT 36.7 07/31/2016   MCV 92.0 07/31/2016   PLT 280 07/31/2016   Lab Results  Component Value Date   NA 137 07/31/2016   K 3.4 (L) 07/31/2016   CO2 25 07/31/2016   GLUCOSE 96 07/31/2016   BUN 9 07/31/2016   CREATININE 0.71 07/31/2016   BILITOT 0.3 07/31/2016   ALKPHOS 73 07/31/2016   AST 14 (L) 07/31/2016   ALT 7 (L) 07/31/2016   PROT 6.5 07/31/2016   ALBUMIN 3.7 07/31/2016   CALCIUM 9.2 07/31/2016   ANIONGAP 6 07/31/2016   GFR 55.25 (L) 06/03/2015   Lab Results  Component Value Date   CHOL 127 07/19/2009   Lab Results  Component Value Date   HDL 42.80 07/19/2009   Lab Results  Component Value Date   LDLCALC 58 07/19/2009   Lab Results  Component Value Date   TRIG 130.0 07/19/2009   Lab Results  Component Value Date   CHOLHDL 3 07/19/2009   Lab Results  Component Value Date   HGBA1C 4.9 08/10/2014         Assessment & Plan:   Problem List Items Addressed This Visit      Unprioritized   Dysuria - Primary   Relevant Orders   POCT Urinalysis Dipstick (Automated) (Completed)    Other Visit Diagnoses    Acute bronchitis with COPD ([place])       Relevant Medications   tiotropium (SPIRIVA HANDIHALER) 18 MCG inhalation capsule   albuterol (PROVENTIL HFA;VENTOLIN HFA) 108 (90 Base) MCG/ACT inhaler   beclomethasone  (QVAR) 40 MCG/ACT inhaler   ipratropium-albuterol (DUONEB) 0.5-2.5 (3) MG/3ML nebulizer solution 3 mL (Completed)   Nausea       Relevant Medications   scopolamine (TRANSDERM-SCOP, 1.5 MG,) 1 MG/3DAYS      I have discontinued Ms. Mulroy's predniSONE and ondansetron. I am also having her start on tiotropium, scopolamine, and beclomethasone. Additionally, I am having her maintain her lurasidone, escitalopram, ACETAMINOPHEN-CODEINE #4 PO, omeprazole, traZODone, clonazePAM, UNKNOWN TO PATIENT, potassium chloride SA, lidocaine, methocarbamol, acetaminophen-codeine, risperiDONE, Suvorexant, PREMARIN, ME/NAPHOS/MB/HYO1, OVER THE COUNTER MEDICATION, and albuterol. We administered ipratropium-albuterol.  Meds ordered this encounter  Medications  . acetaminophen-codeine (TYLENOL #4) 300-60 MG tablet  . risperiDONE (RISPERDAL) 0.5 MG tablet    Sig: Take by mouth.  . Suvorexant 15 MG TABS    Sig: Take 15 mg by mouth.  Marland Kitchen PREMARIN vaginal cream  . Met[place]-Hyosc-Meth Blue-Na Phos (ME/NAPHOS/MB/HYO1) 81.6 MG TABS  . OVER THE COUNTER MEDICATION    Sig: Testosterone 2% jelly  . tiotropium (SPIRIVA HANDIHALER) 18 MCG inhalation capsule    Sig: Place 1 capsule (18 mcg total) into inhaler and inhale daily.    Dispense:  30 capsule    Refill:  12  . scopolamine (TRANSDERM-SCOP, 1.5 MG,) 1 MG/3DAYS    Sig: Place 1 patch (1.5 mg total) onto the skin every 3 (three) days.    Dispense:  10 patch    Refill:  12  . albuterol (PROVENTIL HFA;VENTOLIN HFA) 108 (90 Base) MCG/ACT inhaler    Sig: Inhale 2 puffs into the lungs every 6 (six) hours as needed for wheezing or shortness of breath.    Dispense:  1 Inhaler    Refill:  5  . beclomethasone (QVAR) 40 MCG/ACT inhaler    Sig: Inhale 2 puffs into the lungs 2 (two) times daily.    Dispense:  1 Inhaler    Refill:  12  . ipratropium-albuterol (DUONEB) 0.5-2.5 (3) MG/3ML nebulizer solution 3 mL    CMA served as scribe during this visit. History, Physical and Plan  performed by medical provider. Documentation and orders reviewed and attested to.  [name], DO

## 2016-09-25 ENCOUNTER — Emergency Department (HOSPITAL_COMMUNITY)
Admission: EM | Admit: 2016-09-25 | Discharge: 2016-09-25 | Disposition: A | Discharge status: Home / Self Care | Payer: 59 | Attending: Emergency Medicine | Admitting: Emergency Medicine

## 2016-09-25 ENCOUNTER — Encounter (HOSPITAL_COMMUNITY): Payer: Self-pay | Admitting: Emergency Medicine

## 2016-09-25 ENCOUNTER — Emergency Department (HOSPITAL_COMMUNITY): Payer: 59

## 2016-09-25 DIAGNOSIS — Y999 Unspecified external cause status: Secondary | ICD-10-CM | POA: Insufficient documentation

## 2016-09-25 DIAGNOSIS — I10 Essential (primary) hypertension: Secondary | ICD-10-CM | POA: Diagnosis not present

## 2016-09-25 DIAGNOSIS — Y939 Activity, unspecified: Secondary | ICD-10-CM | POA: Diagnosis not present

## 2016-09-25 DIAGNOSIS — E039 Hypothyroidism, unspecified: Secondary | ICD-10-CM | POA: Insufficient documentation

## 2016-09-25 DIAGNOSIS — F17210 Nicotine dependence, cigarettes, uncomplicated: Secondary | ICD-10-CM | POA: Diagnosis not present

## 2016-09-25 DIAGNOSIS — X500XXA Overexertion from strenuous movement or load, initial encounter: Secondary | ICD-10-CM | POA: Diagnosis not present

## 2016-09-25 DIAGNOSIS — Y929 Unspecified place or not applicable: Secondary | ICD-10-CM | POA: Diagnosis not present

## 2016-09-25 DIAGNOSIS — S32050A Wedge compression fracture of fifth lumbar vertebra, initial encounter for closed fracture: Secondary | ICD-10-CM | POA: Diagnosis not present

## 2016-09-25 DIAGNOSIS — Z8541 Personal history of malignant neoplasm of cervix uteri: Secondary | ICD-10-CM | POA: Insufficient documentation

## 2016-09-25 DIAGNOSIS — J449 Chronic obstructive pulmonary disease, unspecified: Secondary | ICD-10-CM | POA: Diagnosis not present

## 2016-09-25 DIAGNOSIS — S3992XA Unspecified injury of lower back, initial encounter: Secondary | ICD-10-CM | POA: Diagnosis present

## 2016-09-25 DIAGNOSIS — J45909 Unspecified asthma, uncomplicated: Secondary | ICD-10-CM | POA: Insufficient documentation

## 2016-09-25 DIAGNOSIS — Z79899 Other long term (current) drug therapy: Secondary | ICD-10-CM | POA: Insufficient documentation

## 2016-09-25 MED ORDER — CYCLOBENZAPRINE HCL 10 MG PO TABS: 10.0000 mg | ORAL_TABLET | Freq: Two times a day (BID) | ORAL | 0 refills | Status: DC | PRN

## 2016-09-25 MED ORDER — HYDROMORPHONE HCL 1 MG/ML IJ SOLN
1.0000 mg | Freq: Once | INTRAMUSCULAR | Status: AC
  Administered 2016-09-25: 1 mg via INTRAMUSCULAR
  Filled 2016-09-25: qty 1

## 2016-09-25 MED ORDER — OXYCODONE-ACETAMINOPHEN 5-325 MG PO TABS: 1.0000 | ORAL_TABLET | ORAL | 0 refills | Status: DC | PRN

## 2016-09-25 NOTE — ED Triage Notes (Signed)
Pt states lifted something 3 days ago and felt a pop in back. lpt states can not bend forwards or straighten up. C/o pain to lower back. Has been taking tylenol 4's with no relif.

## 2016-09-25 NOTE — Discharge Instructions (Signed)
You have a compression fracture of lumbar #5 vertabrae.  Rest, ice, lumbar support [which you can buy in [place], follow-up with orthopedics.  Phone number given for the doctor on-call for the [facility] system. Or you can try to follow-up with Dr. Durward Fortes.  Prescription for pain medicine and muscle relaxer.

## 2016-09-25 NOTE — ED Notes (Signed)
EDP aware of pt vital signs. No new orders given and reported would be in to assess pt.

## 2016-09-27 NOTE — ED Provider Notes (Signed)
[facility] DEPT Provider Note   CSN: [id number] Arrival date & time: 09/25/16  1233     History   Chief Complaint Chief Complaint  Patient presents with  . Back Pain    HPI [name] is a 41 y.o. female.  Lower back pain after lifting heavy object several days ago. Pain is worse with flexion and extension. No radicular symptoms. She has been taking over-the-counter products with minimal relief. Severity is moderate.      Past Medical History:  Diagnosis Date  . 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 Anal fissure 03/11/2009  . Anemia   . Anxiety   . Anxiety and depression   . ARTHRITIS 08/31/2007  . Arthritis   . Asthma   . 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 Bipolar 1 disorder ([place])   . Bowel obstruction ([place])   . BRONCHITIS, RECURRENT 08/23/2009   Asthmatic Bronchitis-Dr. Melvyn Novas.....-HFA 75% 12/04/2008>75% 02/05/2009>75% 08/04/2009 -PFT's 01/04/2009 2.56 (86%) ratio 75, no resp to B2 and DLC0 67% > 80 after correction   . Cancer (HCC)    cervical cancer  . Chronic interstitial cystitis 03/11/2009  . Chronic nausea   . Chronic pain   . Colon polyps   . COLONIC POLYPS, HX OF 07/25/2006   ADENOMATOUS POLYP  . COPD (chronic obstructive pulmonary disease) ([place])   . DEPRESSION 08/31/2007  . Endometriosis   . FIBROMYALGIA 08/31/2007  . Fibromyalgia   . GERD 02/05/2009  . Hyperlipidemia   . HYPERTENSION 08/31/2007  . IBS 03/11/2009  . Internal hemorrhoids   . Migraine headache   . 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 PONV (postoperative nausea and vomiting)   . RECTAL BLEEDING 03/11/2009  . Seizures ([place])    been about 1 year since last seisure per pt  . SLEEP APNEA 08/31/2007  . Thyroid disease   . Uterine cyst     Patient Active Problem List   Diagnosis Date Noted  . Hyperthyroidism 02/17/2015  . Anxiety 01/28/2015  . Clinical depression 01/28/2015  . COPD exacerbation ([place]) 11/26/2013  . Vaginitis and vulvovaginitis 11/26/2013  . Recurrent pneumonia  02/02/2013  . Left arm pain 01/13/2013  . Left arm pain 01/13/2013    Class: Acute  . Dizziness and giddiness 01/13/2013  . Acute bronchitis 12/07/2012  . Rib pain 12/07/2012  . Insomnia 08/26/2012  . Diarrhea 06/19/2012  . Heme positive stool 06/19/2012  . Bloating 06/19/2012  . Nausea alone 06/19/2012  . Bladder retention 04/24/2012  . Tremor 03/05/2012  . Dysuria 03/05/2012  . Failure to thrive in adult 06/21/2011  . Airway hyperreactivity 03/08/2011  . Back pain, chronic 03/08/2011  . Chronic obstructive pulmonary disease ([place]) 03/08/2011  . Acid reflux 03/08/2011  . Cystitis 01/22/2011  . Bilateral hand numbness 01/13/2011  . WEIGHT LOSS 06/24/2010  . RIB PAIN, LEFT SIDED 05/04/2010  . ABDOMINAL PAIN, LEFT UPPER QUADRANT 05/04/2010  . Unspecified otitis media 10/26/2009  . BRONCHITIS, RECURRENT 08/23/2009  . URI, ACUTE 08/04/2009  . FATIGUE 07/06/2009  . IBS 03/11/2009  . ANAL FISSURE 03/11/2009  . RECTAL BLEEDING 03/11/2009  . Chronic interstitial cystitis 03/11/2009  . COLONIC POLYPS, HX OF 03/11/2009  . GERD 02/05/2009  . SMOKER 12/04/2008  . chronic asthma poorly controlled 12/04/2008  . 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 Bipolar 1 disorder ([place]) 08/31/2007  . HYPERTENSION 08/31/2007  . EMPHYSEMA 08/31/2007  . 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 ARTHRITIS 08/31/2007  . FIBROMYALGIA 08/31/2007  . SLEEP APNEA 08/31/2007    Past  Surgical History:  Procedure Laterality Date  . ABDOMINAL HYSTERECTOMY    . bladder stretching x6    . BLADDER SURGERY     stimulator placed and stretching   . CHOLECYSTECTOMY    . COLONOSCOPY    . COLONOSCOPY WITH PROPOFOL N/A 09/03/2014   Procedure: COLONOSCOPY WITH PROPOFOL;  Surgeon: Milus Banister, MD;  Location: [facility];  Service: Endoscopy;  Laterality: N/A;  . interstitial cystitis    . PACEMAKER INSERTION     in hip for interstitial cystitis  . removal of uterine cyst and  scrapped uterus    . replaced bladder pacemaker      OB History    No data available       Home Medications    Prior to Admission medications   Medication Sig Start Date End Date Taking? Authorizing Provider  hydrOXYzine (ATARAX/VISTARIL) 25 MG tablet Take 25 mg by mouth at bedtime. 01/27/16  Yes [provider]  ACETAMINOPHEN-CODEINE #4 PO Take by mouth. Take for up tp 10 days. Written 01/17/2016    [provider]  acetaminophen-codeine (TYLENOL #4) 300-60 MG tablet  08/21/16   [provider]  albuterol (PROVENTIL HFA;VENTOLIN HFA) 108 (90 Base) MCG/ACT inhaler Inhale 2 puffs into the lungs every 6 (six) hours as needed for wheezing or shortness of breath. 09/01/16   Carollee Herter, Alferd Apa, DO  beclomethasone (QVAR) 40 MCG/ACT inhaler Inhale 2 puffs into the lungs 2 (two) times daily. 09/01/16   [name], DO  clonazePAM (KLONOPIN) 1 MG tablet Take 1 tablet (1 mg total) by mouth 2 (two) times daily as needed for anxiety. 02/15/16   [name], DO  cyclobenzaprine (FLEXERIL) 10 MG tablet Take 1 tablet (10 mg total) by mouth 2 (two) times daily as needed for muscle spasms. 09/25/16   Nat Christen, MD  escitalopram (LEXAPRO) 20 MG tablet Take 1 tablet (20 mg total) by mouth daily. 11/08/15   Roma Schanz R, DO  lidocaine (LIDODERM) 5 % Place 1 patch onto the skin daily. Remove & Discard patch within 12 hours or as directed by MD 07/31/16   Clayton Bibles, PA-C  lurasidone (LATUDA) 40 MG TABS tablet Take 1 tablet (40 mg total) by mouth daily with breakfast. 11/08/15   Carollee Herter, Alferd Apa, DO  Methen-Hyosc-Meth Blue-Na Phos (ME/NAPHOS/MB/HYO1) 81.6 MG TABS  06/30/16   [provider]  methocarbamol (ROBAXIN) 500 MG tablet Take 1-2 tablets (500-1,000 mg total) by mouth every 6 (six) hours as needed for muscle spasms (and pain). 07/31/16   Clayton Bibles, PA-C  omeprazole (PRILOSEC) 40 MG capsule Take 40 mg by mouth daily.    [provider]  OVER THE COUNTER MEDICATION Testosterone 2% jelly    [provider]  oxyCODONE-acetaminophen (PERCOCET) 5-325 MG tablet Take 1-2 tablets by mouth every 4 (four) hours as needed. 09/25/16   Nat Christen, MD  potassium chloride SA (K-DUR,KLOR-CON) 20 MEQ tablet Take 1 tablet (20 mEq total) by mouth 2 (two) times daily. 04/17/16   Orlie Dakin, MD  PREMARIN vaginal cream  06/22/16   [provider]  risperiDONE (RISPERDAL) 0.5 MG tablet Take by mouth. 06/21/16   [provider]  scopolamine (TRANSDERM-SCOP, 1.5 MG,) 1 MG/3DAYS Place 1 patch (1.5 mg total) onto the skin every 3 (three) days. 09/01/16   [name], DO  SPIRIVA RESPIMAT 2.5 MCG/ACT AERS  09/02/16   [provider]  Suvorexant 15 MG TABS Take 15  mg by mouth.    [provider]  traZODone (DESYREL) 50 MG tablet Take 0.5-1 tablets (25-50 mg total) by mouth at bedtime as needed for sleep. 01/18/16   Saguier, Percell Miller, PA-C  UNKNOWN TO PATIENT     [provider]    Family History Family History  Problem Relation Age of Onset  . Heart disease Father   . Asthma Maternal Grandmother   . Emphysema Maternal Grandfather   . Cancer Maternal Grandfather        Lung Cancer  . Cancer Other        Lung Cancer-Aunt  . Colon cancer Neg Hx   . Esophageal cancer Neg Hx   . Rectal cancer Neg Hx   . Stomach cancer Neg Hx   . Thyroid disease Neg Hx     Social History Social History  Substance Use Topics  . Smoking status: Current Every Day Smoker    Packs/day: 1.00    Years: 14.00    Types: Cigarettes  . Smokeless tobacco: Never Used     Comment: trying to quit  . Alcohol use 0.0 oz/week     Comment: rare wine      Allergies   Abilify [aripiprazole]; Metoclopramide hcl; Propoxyphene; Tramadol; Ambien [zolpidem tartrate]; Eszopiclone; Amitriptyline; Metoclopramide; Other; Varenicline; Buprenorphine hcl; Demerol [meperidine]; Emetrol; Morphine and related; and  Propoxyphene n-acetaminophen   Review of Systems Review of Systems  All other systems reviewed and are negative.    Physical Exam Updated Vital Signs BP 90/62   Pulse 75   Temp 97.8 F (36.6 C) (Oral)   Resp 18   Ht 5\' 4"  (1.626 m)   Wt 44.5 kg (98 lb)   SpO2 96%   BMI 16.82 kg/m   Physical Exam  Constitutional: She is oriented to person, place, and time. She appears well-developed and well-nourished.  HENT:  Head: Normocephalic and atraumatic.  Eyes: Conjunctivae are normal.  Neck: Neck supple.  Cardiovascular: Normal rate and regular rhythm.   Pulmonary/Chest: Effort normal and breath sounds normal.  Abdominal: Soft. Bowel sounds are normal.  Musculoskeletal: Normal range of motion.  Tender lower lumbar spine.  Neurological: She is alert and oriented to person, place, and time.  Skin: Skin is warm and dry.  Psychiatric: She has a normal mood and affect. Her behavior is normal.  Nursing note and vitals reviewed.    ED Treatments / Results  Labs (all labs ordered are listed, but only abnormal results are displayed) Labs Reviewed - No data to display  EKG  EKG Interpretation None       Radiology Dg Lumbar Spine Complete  Result Date: 09/25/2016 CLINICAL DATA:  41 year old with acute injury to the low back when lifting. Severe low back pain radiating into both lower extremities. Numbness in both lower extremities. Initial encounter. EXAM: LUMBAR SPINE - COMPLETE 4+ VIEW COMPARISON:  03/22/2016, 10/16/2015, 05/05/2015 an bone window images from CT abdomen pelvis 07/31/2016. FINDINGS: Five non-rib-bearing lumbar vertebrae with anatomic posterior alignment. Acute fracture involving the upper endplate of L5 on the order of 15% or so, new since the CT in April, 2018. No other fractures. No pars defects. No facet arthropathy. Sacroiliac joints intact. Sacral plexus stimulating device noted. IMPRESSION: Acute compression fracture involving the upper endplate of L5 on the  order of 50% or so. Electronically Signed   By: Evangeline Dakin M.D.   On: 09/25/2016 14:52    Procedures Procedures (including critical care time)  Medications Ordered in ED Medications  HYDROmorphone (DILAUDID) injection 1 mg (1 mg Intramuscular Given 09/25/16 1358)     Initial Impression / Assessment and Plan / ED Course  I have reviewed the triage vital signs and the nursing notes.  Pertinent labs & imaging results that were available during my care of the patient were reviewed by me and considered in my medical decision making (see chart for details).     Plain films of the lumbar spine reveal a L5 compression fracture. This was discussed with the patient and her mother. Discharge medications Percocet and Flexeril 10 mg. Recommend orthopedic follow-up. Prescription for walker given.  Final Clinical Impressions(s) / ED Diagnoses   Final diagnoses:  Closed compression fracture of fifth lumbar vertebra, initial encounter [facility])    New Prescriptions Discharge Medication List as of 09/25/2016  3:55 PM    START taking these medications   Details  cyclobenzaprine (FLEXERIL) 10 MG tablet Take 1 tablet (10 mg total) by mouth 2 (two) times daily as needed for muscle spasms., Starting Mon 09/25/2016, Print    oxyCODONE-acetaminophen (PERCOCET) 5-325 MG tablet Take 1-2 tablets by mouth every 4 (four) hours as needed., Starting Mon 09/25/2016, Print         Nat Christen, MD 09/27/16 1444

## 2016-09-28 ENCOUNTER — Encounter: Payer: Self-pay | Admitting: Family Medicine

## 2016-09-28 ENCOUNTER — Telehealth: Payer: Self-pay | Admitting: Family Medicine

## 2016-09-28 ENCOUNTER — Ambulatory Visit (INDEPENDENT_AMBULATORY_CARE_PROVIDER_SITE_OTHER): Payer: 59 | Admitting: Family Medicine

## 2016-09-28 VITALS — BP 104/73 | HR 117 | Temp 98.2°F | Resp 16

## 2016-09-28 DIAGNOSIS — S32050A Wedge compression fracture of fifth lumbar vertebra, initial encounter for closed fracture: Secondary | ICD-10-CM

## 2016-09-28 MED ORDER — OXYCODONE-ACETAMINOPHEN 5-325 MG PO TABS: 1.0000 | ORAL_TABLET | ORAL | 0 refills | Status: DC | PRN

## 2016-09-28 MED FILL — OXYCODONE/APAP 5/325 MG TAB: 5-325 | 2 days supply | Qty: 20 | Fill #0

## 2016-09-28 NOTE — Patient Instructions (Signed)
Spinal Compression Fracture A spinal compression fracture is a collapse of the bones that form the spine (vertebrae). With this type of fracture, the vertebrae become squashed (compressed) into a wedge shape. Most compression fractures happen in the middle or lower part of the spine. What are the causes? This condition may be caused by:  Thinning and loss of density in the bones (osteoporosis). This is the most common cause.  A fall.  A car or motorcycle accident.  Cancer.  Trauma, such as a heavy, direct hit to the head.  What increases the risk? You may be at greater risk for a spinal compression fracture if you:  Are 31 years old or older.  Have osteoporosis.  Have certain types of cancer, including: ? Multiple myeloma. ? Lymphoma. ? Prostate cancer. ? Lung cancer. ? Breast cancer.  What are the signs or symptoms? Symptoms of this condition include:  Severe pain.  Pain that gets worse over time.  Pain that is worse when you stand, walk, sit, or bend.  Sudden pain that is so bad that it is hard for you to move.  Bending or humping of the spine.  Gradual loss of height.  Numbness, tingling, or weakness in the back and legs.  Trouble walking.  Your symptoms will depend on the cause of the fracture and how quickly it develops. For example, fractures that are caused by osteoporosis can cause few symptoms, no symptoms, or symptoms that develop slowly over time. How is this diagnosed? This condition may be diagnosed based on symptoms, medical history, and a physical exam. During the physical exam, your health care provider may tap along the length of your spine to check for tenderness. Tests may be done to confirm the diagnosis. They may include:  A bone density test to check for osteoporosis.  Imaging tests, such as a spine X-ray, a CT scan, or MRI.  How is this treated? Treatment for this condition depends on the cause and severity of the condition.Some  fractures, such as those that are caused by osteoporosis, may heal on their own with supportive care. This may include:  Pain medicine.  Rest.  A back brace.  Physical therapy exercises.  Medicine that reduces bone pain.  Calcium and vitamin D supplements.  Fractures that cause the back to become misshapen, cause nerve pain or weakness, or do not respond to other treatment may be treated with a surgical procedure, such as:  Vertebroplasty. In this procedure, bone cement is injected into the collapsed vertebrae to stabilize them.  Balloon kyphoplasty. In this procedure, the collapsed vertebrae are expanded with a balloon and then bone cement is injected into them.  Spinal fusion. In this procedure, the collapsed vertebrae are connected (fused) to normal vertebrae.  Follow these instructions at home: General instructions  Take medicines only as directed by your health care provider.  Do not drive or operate heavy machinery while taking pain medicine.  If directed, apply ice to the injured area: ? Put ice in a plastic bag. ? Place a towel between your skin and the bag. ? Leave the ice on for 30 minutes every two hours at first. Then apply the ice as needed.  Wear your neck brace or back brace as directed by your health care provider.  Do not drink alcohol. Alcohol can interfere with your treatment.  Keep all follow-up visits as directed by your health care provider. This is important. It can help to prevent permanent injury, disability, and long-lasting (chronic) pain.  Activity  Stay in bed (on bed rest) only as directed by your health care provider. Being on bed rest for too long can make your condition worse.  Return to your normal activities as directed by your health care provider. Ask what activities are safe for you.  Do exercises to improve motion and strength in your back (physical therapy), as recommended by your health care provider.  Exercise regularly as  directed by your health care provider. Contact a health care provider if:  You have a fever.  You develop a cough that makes your pain worse.  Your pain medicine is not helping.  Your pain does not get better over time.  You cannot return to your normal activities as planned or expected. Get help right away if:  Your pain is very bad and it suddenly gets worse.  You are unable to move any body part (paralysis) that is below the level of your injury.  You have numbness, tingling, or weakness in any body part that is below the level of your injury.  You cannot control your bladder or bowels. This information is not intended to replace advice given to you by your health care provider. Make sure you discuss any questions you have with your health care provider. Document Released: 03/27/2005 Document Revised: 11/23/2015 Document Reviewed: 03/31/2014 Elsevier Interactive Patient Education  [employer].

## 2016-09-28 NOTE — Telephone Encounter (Signed)
Called left message to call back 

## 2016-09-28 NOTE — Progress Notes (Signed)
Patient ID: [name], female   DOB: [birth year], 41 y.o.   MRN: [id number]     Subjective:  I acted as a Education administrator for Dr. Carollee Herter.  [name], [place]   Patient ID: [name], female    DOB: 10-26-1975, 41 y.o.   MRN: [id number]  Chief Complaint  Patient presents with  . Hospitalization Follow-up    ed visit 09/25/16    HPI  Patient is in today for ER follow up for lumbar fracture.  She needs a referral to ortho and refill on pain meds until she gets into to be seen by ortho.  Patient Care Team: Carollee Herter, Alferd Apa, DO as PCP - General Marisue Ivan, MD as Referring Physician (Urology)   Past Medical History:  Diagnosis Date  . 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 Anal fissure 03/11/2009  . Anemia   . Anxiety   . Anxiety and depression   . ARTHRITIS 08/31/2007  . Arthritis   . Asthma   . 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 Bipolar 1 disorder ([place])   . Bowel obstruction ([place])   . BRONCHITIS, RECURRENT 08/23/2009   Asthmatic Bronchitis-Dr. Melvyn Novas.....-HFA 75% 12/04/2008>75% 02/05/2009>75% 08/04/2009 -PFT's 01/04/2009 2.56 (86%) ratio 75, no resp to B2 and DLC0 67% > 80 after correction   . Cancer (HCC)    cervical cancer  . Chronic interstitial cystitis 03/11/2009  . Chronic nausea   . Chronic pain   . Colon polyps   . COLONIC POLYPS, HX OF 07/25/2006   ADENOMATOUS POLYP  . COPD (chronic obstructive pulmonary disease) ([place])   . DEPRESSION 08/31/2007  . Endometriosis   . FIBROMYALGIA 08/31/2007  . Fibromyalgia   . GERD 02/05/2009  . Hyperlipidemia   . HYPERTENSION 08/31/2007  . IBS 03/11/2009  . Internal hemorrhoids   . Migraine headache   . 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 PONV (postoperative nausea and vomiting)   . RECTAL BLEEDING 03/11/2009  . Seizures ([place])    been about 1 year since last seisure per pt  . SLEEP APNEA 08/31/2007  . Thyroid disease   . Uterine cyst     Past Surgical History:  Procedure Laterality Date  . ABDOMINAL HYSTERECTOMY    . bladder  stretching x6    . BLADDER SURGERY     stimulator placed and stretching   . CHOLECYSTECTOMY    . COLONOSCOPY    . COLONOSCOPY WITH PROPOFOL N/A 09/03/2014   Procedure: COLONOSCOPY WITH PROPOFOL;  Surgeon: Milus Banister, MD;  Location: [facility];  Service: Endoscopy;  Laterality: N/A;  . interstitial cystitis    . PACEMAKER INSERTION     in hip for interstitial cystitis  . removal of uterine cyst and scrapped uterus    . replaced bladder pacemaker      Family History  Problem Relation Age of Onset  . Heart disease Father   . Asthma Maternal Grandmother   . Emphysema Maternal Grandfather   . Cancer Maternal Grandfather        Lung Cancer  . Cancer Other        Lung Cancer-Aunt  . Colon cancer Neg Hx   . Esophageal cancer Neg Hx   . Rectal cancer Neg Hx   . Stomach cancer Neg Hx   . Thyroid disease Neg Hx     Social History   Social History  . Marital status: Married    Spouse name: N/A  . Number of children: 2  . Years of education: N/A  Occupational History  .  Unemployed   Social History Main Topics  . Smoking status: Current Every Day Smoker    Packs/day: 1.00    Years: 14.00    Types: Cigarettes  . Smokeless tobacco: Never Used     Comment: trying to quit  . Alcohol use 0.0 oz/week     Comment: rare wine   . Drug use: Yes    Types: Marijuana     Comment: once a month  . Sexual activity: Not on file   Other Topics Concern  . Not on file   Social History Narrative   Homemaker   Daily Caffeine Use-[employer]          Outpatient Medications Prior to Visit  Medication Sig Dispense Refill  . albuterol (PROVENTIL HFA;VENTOLIN HFA) 108 (90 Base) MCG/ACT inhaler Inhale 2 puffs into the lungs every 6 (six) hours as needed for wheezing or shortness of breath. 1 Inhaler 5  . beclomethasone (QVAR) 40 MCG/ACT inhaler Inhale 2 puffs into the lungs 2 (two) times daily. 1 Inhaler 12  . clonazePAM (KLONOPIN) 1 MG tablet Take 1 tablet (1 mg total) by mouth 2  (two) times daily as needed for anxiety. 60 tablet 0  . cyclobenzaprine (FLEXERIL) 10 MG tablet Take 1 tablet (10 mg total) by mouth 2 (two) times daily as needed for muscle spasms. 20 tablet 0  . escitalopram (LEXAPRO) 20 MG tablet Take 1 tablet (20 mg total) by mouth daily. 30 tablet 5  . hydrOXYzine (ATARAX/VISTARIL) 25 MG tablet Take 25 mg by mouth at bedtime.    . lidocaine (LIDODERM) 5 % Place 1 patch onto the skin daily. Remove & Discard patch within 12 hours or as directed by MD 30 patch 0  . lurasidone (LATUDA) 40 MG TABS tablet Take 1 tablet (40 mg total) by mouth daily with breakfast. 30 tablet 2  . Methen-Hyosc-Meth Blue-Na Phos (ME/NAPHOS/MB/HYO1) 81.6 MG TABS     . methocarbamol (ROBAXIN) 500 MG tablet Take 1-2 tablets (500-1,000 mg total) by mouth every 6 (six) hours as needed for muscle spasms (and pain). 10 tablet 0  . omeprazole (PRILOSEC) 40 MG capsule Take 40 mg by mouth daily.    Marland Kitchen OVER THE COUNTER MEDICATION Testosterone 2% jelly    . potassium chloride SA (K-DUR,KLOR-CON) 20 MEQ tablet Take 1 tablet (20 mEq total) by mouth 2 (two) times daily. 14 tablet 0  . PREMARIN vaginal cream     . risperiDONE (RISPERDAL) 0.5 MG tablet Take by mouth.    Marland Kitchen scopolamine (TRANSDERM-SCOP, 1.5 MG,) 1 MG/3DAYS Place 1 patch (1.5 mg total) onto the skin every 3 (three) days. 10 patch 12  . SPIRIVA RESPIMAT 2.5 MCG/ACT AERS     . Suvorexant 15 MG TABS Take 15 mg by mouth.    . traZODone (DESYREL) 50 MG tablet Take 0.5-1 tablets (25-50 mg total) by mouth at bedtime as needed for sleep. 30 tablet 0  . UNKNOWN TO PATIENT     . oxyCODONE-acetaminophen (PERCOCET) 5-325 MG tablet Take 1-2 tablets by mouth every 4 (four) hours as needed. 20 tablet 0  . acetaminophen-codeine (TYLENOL #4) 300-60 MG tablet     . ACETAMINOPHEN-CODEINE #4 PO Take by mouth. Take for up tp 10 days. Written 01/17/2016     No facility-administered medications prior to visit.     Allergies  Allergen Reactions  . Abilify  [Aripiprazole] Swelling, Other (See Comments) and Palpitations    tremors Throat swelling, tremors  . Metoclopramide Hcl  Other (See Comments)    Causes seizures  . Propoxyphene Rash  . Tramadol Swelling, Other (See Comments) and Rash    Throat swelling, tremors  . Ambien [Zolpidem Tartrate] Other (See Comments)    hallucinations  . Eszopiclone Other (See Comments)    Hallucinations, hyper, bad taste in mouth   . Amitriptyline     Other reaction(s): Angioedema (ALLERGY/intolerance)  . Metoclopramide     Other reaction(s): Other (See Comments) Seizures  . Other Swelling    Acetaminophen #3 Acetaminophen #3  . Varenicline Other (See Comments)    Suicidal thoughts  . Buprenorphine Hcl Itching and Hives  . Demerol [Meperidine] Rash  . Emetrol Itching, Rash and Hives    Other reaction(s): Rash (ALLERGY/intolerance)  . Morphine And Related Hives and Itching  . Propoxyphene N-Acetaminophen Hives, Itching and Rash    Other reaction(s): Rash (ALLERGY/intolerance)    Review of Systems  Constitutional: Negative for fever and malaise/fatigue.  HENT: Negative for congestion.   Eyes: Negative for blurred vision.  Respiratory: Negative for cough and shortness of breath.   Cardiovascular: Negative for chest pain, palpitations and leg swelling.  Gastrointestinal: Negative for vomiting.  Musculoskeletal: Positive for back pain.  Skin: Negative for rash.  Neurological: Negative for loss of consciousness and headaches.       Objective:    Physical Exam  Constitutional: She appears well-developed and well-nourished.  Musculoskeletal: She exhibits tenderness. She exhibits no edema or deformity.       Lumbar back: She exhibits decreased range of motion, tenderness, pain and spasm.  Nursing note and vitals reviewed.   BP 104/73 (BP Location: Right Arm, Cuff Size: Normal)   Pulse (!) 117   Temp 98.2 F (36.8 C) (Oral)   Resp 16   SpO2 96%  Wt Readings from Last 3 Encounters:    09/25/16 98 lb (44.5 kg)  09/01/16 94 lb (42.6 kg)  04/17/16 130 lb (59 kg)   BP Readings from Last 3 Encounters:  09/28/16 104/73  09/25/16 90/62  09/01/16 99/74     Immunization History  Administered Date(s) Administered  . Tdap 05/05/2015    Health Maintenance  Topic Date Due  . HIV Screening  04/24/1991  . INFLUENZA VACCINE  11/08/2016  . PAP SMEAR  09/24/2017  . COLONOSCOPY  09/03/2019  . TETANUS/TDAP  05/04/2025    Lab Results  Component Value Date   WBC 8.4 07/31/2016   HGB 12.5 07/31/2016   HCT 36.7 07/31/2016   PLT 280 07/31/2016   GLUCOSE 96 07/31/2016   CHOL 127 07/19/2009   TRIG 130.0 07/19/2009   HDL 42.80 07/19/2009   LDLCALC 58 07/19/2009   ALT 7 (L) 07/31/2016   AST 14 (L) 07/31/2016   NA 137 07/31/2016   K 3.4 (L) 07/31/2016   CL 106 07/31/2016   CREATININE 0.71 07/31/2016   BUN 9 07/31/2016   CO2 25 07/31/2016   TSH 1.39 06/03/2015   HGBA1C 4.9 08/10/2014    Lab Results  Component Value Date   TSH 1.39 06/03/2015   Lab Results  Component Value Date   WBC 8.4 07/31/2016   HGB 12.5 07/31/2016   HCT 36.7 07/31/2016   MCV 92.0 07/31/2016   PLT 280 07/31/2016   Lab Results  Component Value Date   NA 137 07/31/2016   K 3.4 (L) 07/31/2016   CO2 25 07/31/2016   GLUCOSE 96 07/31/2016   BUN 9 07/31/2016   CREATININE 0.71 07/31/2016   BILITOT 0.3 07/31/2016  ALKPHOS 73 07/31/2016   AST 14 (L) 07/31/2016   ALT 7 (L) 07/31/2016   PROT 6.5 07/31/2016   ALBUMIN 3.7 07/31/2016   CALCIUM 9.2 07/31/2016   ANIONGAP 6 07/31/2016   GFR 55.25 (L) 06/03/2015   Lab Results  Component Value Date   CHOL 127 07/19/2009   Lab Results  Component Value Date   HDL 42.80 07/19/2009   Lab Results  Component Value Date   LDLCALC 58 07/19/2009   Lab Results  Component Value Date   TRIG 130.0 07/19/2009   Lab Results  Component Value Date   CHOLHDL 3 07/19/2009   Lab Results  Component Value Date   HGBA1C 4.9 08/10/2014          Assessment & Plan:   Problem List Items Addressed This Visit    None    Visit Diagnoses    Closed compression fracture of fifth lumbar vertebra, initial encounter [facility])    -  Primary   Relevant Orders   Ambulatory referral to Orthopedic Surgery   Ambulatory referral to Orthopedic Surgery    pt has pain meds from er F/u ortho--- appointment tomorrow I have discontinued Ms. Towles's ACETAMINOPHEN-CODEINE #4 PO and oxyCODONE-acetaminophen. I am also having her maintain her lurasidone, escitalopram, omeprazole, traZODone, clonazePAM, UNKNOWN TO PATIENT, potassium chloride SA, lidocaine, methocarbamol, acetaminophen-codeine, risperiDONE, Suvorexant, PREMARIN, ME/NAPHOS/MB/HYO1, OVER THE COUNTER MEDICATION, scopolamine, albuterol, beclomethasone, hydrOXYzine, SPIRIVA RESPIMAT, and cyclobenzaprine.  Meds ordered this encounter  Medications  . DISCONTD: oxyCODONE-acetaminophen (PERCOCET) 5-325 MG tablet    Sig: Take 1-2 tablets by mouth every 4 (four) hours as needed.    Dispense:  20 tablet    Refill:  0    CMA served as scribe during this visit. History, Physical and Plan performed by medical provider. Documentation and orders reviewed and attested to.  [name], DO

## 2016-09-28 NOTE — Telephone Encounter (Signed)
She was told to see PCP to get her medication refilled/then get a referral to ortho She has appt today

## 2016-09-28 NOTE — Telephone Encounter (Signed)
-----   Message from [name], DO sent at 09/28/2016 10:34 AM EDT ----- Pt was seen in er and advised to see ortho --- not here Does she have ortho appointment ?

## 2016-09-29 ENCOUNTER — Ambulatory Visit (INDEPENDENT_AMBULATORY_CARE_PROVIDER_SITE_OTHER): Payer: 59 | Admitting: Orthopaedic Surgery

## 2016-09-29 ENCOUNTER — Encounter (INDEPENDENT_AMBULATORY_CARE_PROVIDER_SITE_OTHER): Payer: Self-pay | Admitting: Orthopaedic Surgery

## 2016-09-29 DIAGNOSIS — S32050A Wedge compression fracture of fifth lumbar vertebra, initial encounter for closed fracture: Secondary | ICD-10-CM

## 2016-09-29 MED ORDER — OXYCODONE-ACETAMINOPHEN 5-325 MG PO TABS: 1.0000 | ORAL_TABLET | ORAL | 0 refills | Status: DC | PRN

## 2016-09-29 NOTE — Progress Notes (Signed)
Office Visit Note   Patient: [name]           Date of Birth: 04-03-1976           MRN: [id number] Visit Date: 09/29/2016              Requested by: [address] [place], [place] [address] [place], [place] [address] PCP: Carollee Herter, Alferd Apa, DO   Assessment & Plan: Visit Diagnoses:  1. Closed compression fracture of fifth lumbar vertebra, initial encounter ([place])     Plan: nondisplaced L5 comp fx.  [facility] quick draw corset Rx given to biotech.  UAT.  Percocet refilled.  Walker as needed.  F/u 6 weeks for recheck.  Anticipate beginning PT at that time.  Follow-Up Instructions: Return in about 6 weeks (around 11/10/2016).   Orders:  No orders of the defined types were placed in this encounter.  Meds ordered this encounter  Medications  . oxyCODONE-acetaminophen (PERCOCET) 5-325 MG tablet    Sig: Take 1 tablet by mouth every 4 (four) hours as needed.    Dispense:  50 tablet    Refill:  0      Procedures: No procedures performed   Clinical Data: No additional findings.   Subjective: Chief Complaint  Patient presents with  . Lower Back - Injury    [name] is a 41 year old female with h/o fibromyalgia, chronic pain syndrome, osteoporosis who presents with L5 compression fracture from trying to lift a trash can onto a truck bed.  She felt immediate pain and pop.  Her legs give out due to pain.  Denies any bowel or bladder dysfunction.  Pain doesn't radiate.      Review of Systems  Constitutional: Negative.   HENT: Negative.   Eyes: Negative.   Respiratory: Negative.   Cardiovascular: Negative.   Endocrine: Negative.   Musculoskeletal: Negative.   Neurological: Negative.   Hematological: Negative.   Psychiatric/Behavioral: Negative.   All other systems reviewed and are negative.    Objective: Vital Signs: There were no vitals taken for this visit.  Physical Exam  Constitutional: She is oriented to person, place, and time. She appears  well-developed and well-nourished.  HENT:  Head: Normocephalic and atraumatic.  Eyes: EOM are normal.  Neck: Neck supple.  Pulmonary/Chest: Effort normal.  Abdominal: Soft.  Neurological: She is alert and oriented to person, place, and time.  Skin: Skin is warm. Capillary refill takes less than 2 seconds.  Psychiatric: She has a normal mood and affect. Her behavior is normal. Judgment and thought content normal.  Nursing note and vitals reviewed.   Ortho Exam nonfocal exam.  L5 is ttp Specialty Comments:  No specialty comments available.  Imaging: No results found.   PMFS History: Patient Active Problem List   Diagnosis Date Noted  . Hyperthyroidism 02/17/2015  . Anxiety 01/28/2015  . Clinical depression 01/28/2015  . COPD exacerbation ([place]) 11/26/2013  . Vaginitis and vulvovaginitis 11/26/2013  . Recurrent pneumonia 02/02/2013  . Left arm pain 01/13/2013  . Left arm pain 01/13/2013    Class: Acute  . Dizziness and giddiness 01/13/2013  . Acute bronchitis 12/07/2012  . Rib pain 12/07/2012  . Insomnia 08/26/2012  . Diarrhea 06/19/2012  . Heme positive stool 06/19/2012  . Bloating 06/19/2012  . Nausea alone 06/19/2012  . Bladder retention 04/24/2012  . Tremor 03/05/2012  . Dysuria 03/05/2012  . Failure to thrive in adult 06/21/2011  . Airway hyperreactivity 03/08/2011  .  Back pain, chronic 03/08/2011  . Chronic obstructive pulmonary disease ([place]) 03/08/2011  . Acid reflux 03/08/2011  . Cystitis 01/22/2011  . Bilateral hand numbness 01/13/2011  . WEIGHT LOSS 06/24/2010  . RIB PAIN, LEFT SIDED 05/04/2010  . ABDOMINAL PAIN, LEFT UPPER QUADRANT 05/04/2010  . Unspecified otitis media 10/26/2009  . BRONCHITIS, RECURRENT 08/23/2009  . URI, ACUTE 08/04/2009  . FATIGUE 07/06/2009  . IBS 03/11/2009  . ANAL FISSURE 03/11/2009  . RECTAL BLEEDING 03/11/2009  . Chronic interstitial cystitis 03/11/2009  . COLONIC POLYPS, HX OF 03/11/2009  . GERD 02/05/2009  . SMOKER  12/04/2008  . chronic asthma poorly controlled 12/04/2008  . 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 Bipolar 1 disorder ([place]) 08/31/2007  . HYPERTENSION 08/31/2007  . EMPHYSEMA 08/31/2007  . 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 ARTHRITIS 08/31/2007  . FIBROMYALGIA 08/31/2007  . SLEEP APNEA 08/31/2007   Past Medical History:  Diagnosis Date  . 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 Anal fissure 03/11/2009  . Anemia   . Anxiety   . Anxiety and depression   . ARTHRITIS 08/31/2007  . Arthritis   . Asthma   . 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 Bipolar 1 disorder ([place])   . Bowel obstruction ([place])   . BRONCHITIS, RECURRENT 08/23/2009   Asthmatic Bronchitis-Dr. Melvyn Novas.....-HFA 75% 12/04/2008>75% 02/05/2009>75% 08/04/2009 -PFT's 01/04/2009 2.56 (86%) ratio 75, no resp to B2 and DLC0 67% > 80 after correction   . Cancer (HCC)    cervical cancer  . Chronic interstitial cystitis 03/11/2009  . Chronic nausea   . Chronic pain   . Colon polyps   . COLONIC POLYPS, HX OF 07/25/2006   ADENOMATOUS POLYP  . COPD (chronic obstructive pulmonary disease) ([place])   . DEPRESSION 08/31/2007  . Endometriosis   . FIBROMYALGIA 08/31/2007  . Fibromyalgia   . GERD 02/05/2009  . Hyperlipidemia   . HYPERTENSION 08/31/2007  . IBS 03/11/2009  . Internal hemorrhoids   . Migraine headache   . 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 PONV (postoperative nausea and vomiting)   . RECTAL BLEEDING 03/11/2009  . Seizures ([place])    been about 1 year since last seisure per pt  . SLEEP APNEA 08/31/2007  . Thyroid disease   . Uterine cyst     Family History  Problem Relation Age of Onset  . Heart disease Father   . Asthma Maternal Grandmother   . Emphysema Maternal Grandfather   . Cancer Maternal Grandfather        Lung Cancer  . Cancer Other        Lung Cancer-Aunt  . Colon cancer Neg Hx   . Esophageal cancer Neg Hx   . Rectal cancer Neg Hx   . Stomach cancer Neg Hx   . Thyroid disease  Neg Hx     Past Surgical History:  Procedure Laterality Date  . ABDOMINAL HYSTERECTOMY    . bladder stretching x6    . BLADDER SURGERY     stimulator placed and stretching   . CHOLECYSTECTOMY    . COLONOSCOPY    . COLONOSCOPY WITH PROPOFOL N/A 09/03/2014   Procedure: COLONOSCOPY WITH PROPOFOL;  Surgeon: Milus Banister, MD;  Location: [facility];  Service: Endoscopy;  Laterality: N/A;  . interstitial cystitis    . PACEMAKER INSERTION     in hip for interstitial cystitis  . removal of uterine cyst and scrapped uterus    . replaced bladder pacemaker     Social History   Occupational History  .  Unemployed   Social History Main Topics  . Smoking status: Current Every Day Smoker    Packs/day: 1.00    Years: 14.00    Types: Cigarettes  . Smokeless tobacco: Never Used     Comment: trying to quit  . Alcohol use 0.0 oz/week     Comment: rare wine   . Drug use: Yes    Types: Marijuana     Comment: once a month  . Sexual activity: Not on file

## 2016-10-16 ENCOUNTER — Ambulatory Visit (INDEPENDENT_AMBULATORY_CARE_PROVIDER_SITE_OTHER): Payer: 59 | Admitting: Orthopaedic Surgery

## 2016-10-16 ENCOUNTER — Encounter (INDEPENDENT_AMBULATORY_CARE_PROVIDER_SITE_OTHER): Payer: Self-pay | Admitting: Orthopaedic Surgery

## 2016-10-16 DIAGNOSIS — S32050A Wedge compression fracture of fifth lumbar vertebra, initial encounter for closed fracture: Secondary | ICD-10-CM

## 2016-10-16 MED ORDER — OXYCODONE-ACETAMINOPHEN 5-325 MG PO TABS: 1.0000 | ORAL_TABLET | Freq: Two times a day (BID) | ORAL | 0 refills | Status: DC | PRN

## 2016-10-16 NOTE — Addendum Note (Signed)
Addended by: Michae Kava B on: 10/16/2016 11:48 AM   Modules accepted: Orders

## 2016-10-16 NOTE — Progress Notes (Signed)
Patient follows up today for acute worsening of her low back pain. She has L5 compression fracture but she recently had 2 falls. She states that she felt a pop and also feels a prominence in her lower back. She has no focal findings on exam her lower extremities. The prominent area feels like a lead that's connected to her bladder stimulator. CT scan of the lumbar spine is ordered to assess for worsening of her fracture with the recent falls. #20 Percocet was refilled. We will notify the patient on the results.

## 2016-10-19 ENCOUNTER — Telehealth: Payer: Self-pay | Admitting: *Deleted

## 2016-10-19 NOTE — Telephone Encounter (Signed)
Received Physician Orders from [facility] for [name], forwarded to provider/SLS 07/12

## 2016-10-23 ENCOUNTER — Ambulatory Visit
Admission: RE | Admit: 2016-10-23 | Discharge: 2016-10-23 | Disposition: A | Discharge status: Home / Self Care | Payer: 59 | Source: Ambulatory Visit | Attending: Orthopaedic Surgery | Admitting: Orthopaedic Surgery

## 2016-10-23 DIAGNOSIS — S32050A Wedge compression fracture of fifth lumbar vertebra, initial encounter for closed fracture: Secondary | ICD-10-CM

## 2016-11-10 ENCOUNTER — Ambulatory Visit (INDEPENDENT_AMBULATORY_CARE_PROVIDER_SITE_OTHER): Payer: 59 | Admitting: Orthopaedic Surgery

## 2016-12-25 ENCOUNTER — Telehealth: Payer: Self-pay | Admitting: *Deleted

## 2016-12-25 NOTE — Telephone Encounter (Signed)
Received Physician Orders from [facility]; forwarded to provider with attached SAS envelope/SLS 09/17

## 2017-02-22 ENCOUNTER — Other Ambulatory Visit (HOSPITAL_COMMUNITY)
Admission: RE | Admit: 2017-02-22 | Discharge: 2017-02-22 | Disposition: A | Discharge status: Home / Self Care | Payer: 59 | Source: Ambulatory Visit | Attending: Medical | Admitting: Medical

## 2017-02-22 ENCOUNTER — Encounter: Payer: Self-pay | Admitting: Medical

## 2017-02-22 ENCOUNTER — Ambulatory Visit (INDEPENDENT_AMBULATORY_CARE_PROVIDER_SITE_OTHER): Payer: 59 | Admitting: Medical

## 2017-02-22 VITALS — BP 99/74 | HR 86 | Temp 98.5°F | Resp 16 | Wt 104.6 lb

## 2017-02-22 DIAGNOSIS — N898 Other specified noninflammatory disorders of vagina: Secondary | ICD-10-CM

## 2017-02-22 DIAGNOSIS — R300 Dysuria: Secondary | ICD-10-CM

## 2017-02-22 LAB — POCT URINALYSIS DIPSTICK
Bilirubin, UA: NEGATIVE
Blood, UA: NEGATIVE
Glucose, UA: NEGATIVE
Ketones, UA: NEGATIVE
Leukocytes, UA: NEGATIVE
Nitrite, UA: NEGATIVE
Protein, UA: NEGATIVE
Spec Grav, UA: 1.015 (ref 1.010–1.025)
Urobilinogen, UA: NEGATIVE E.U./dL — AB
pH, UA: 6 (ref 5.0–8.0)

## 2017-02-22 MED ORDER — METRONIDAZOLE 500 MG PO TABS: 500.0000 mg | ORAL_TABLET | Freq: Three times a day (TID) | ORAL | 0 refills | Status: DC

## 2017-02-22 NOTE — Progress Notes (Signed)
Subjective:    Patient ID: [name], female    DOB: June 14, 1975, 41 y.o.   MRN: [id number]  HPI  Pt in with some lower abdomen/pelvic area pain. Mild vaginal dc with odor last 2 days. Faint Yellowish whitish dc. No vaginal itch. Slight pressure discomfort when she urinates.  No fever but feels some chills. Pt does have hx of intersticial  cystitis infection in the past.   Pt also gets yeast infection and cystitis as well occasionally.  Pt states that about 2 self catheterize twice a week. Describes at times urge to urinate at times after going and describes residual volume.  Pt had history of total hysterectomy.  Pt does get BV in the past.    Review of Systems  Constitutional: Negative for chills, fatigue and fever.  Respiratory: Negative for cough, chest tightness, shortness of breath and wheezing.   Cardiovascular: Negative for chest pain and palpitations.  Gastrointestinal: Negative for abdominal pain.  Genitourinary: Positive for dysuria and vaginal discharge. Negative for decreased urine volume, difficulty urinating, frequency, hematuria, urgency, vaginal bleeding and vaginal pain.  Musculoskeletal: Negative for back pain.  Neurological: Negative for dizziness and headaches.  Hematological: Negative for adenopathy. Does not bruise/bleed easily.  Psychiatric/Behavioral: Negative for behavioral problems and confusion.   Past Medical History:  Diagnosis Date  . 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 Anal fissure 03/11/2009  . Anemia   . Anxiety   . Anxiety and depression   . ARTHRITIS 08/31/2007  . Arthritis   . Asthma   . 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 Bipolar 1 disorder ([place])   . Bowel obstruction ([place])   . BRONCHITIS, RECURRENT 08/23/2009   Asthmatic Bronchitis-Dr. Melvyn Novas.....-HFA 75% 12/04/2008>75% 02/05/2009>75% 08/04/2009 -PFT's 01/04/2009 2.56 (86%) ratio 75, no resp to B2 and DLC0 67% > 80 after correction   . Cancer (HCC)    cervical cancer  . Chronic  interstitial cystitis 03/11/2009  . Chronic nausea   . Chronic pain   . Colon polyps   . COLONIC POLYPS, HX OF 07/25/2006   ADENOMATOUS POLYP  . COPD (chronic obstructive pulmonary disease) ([place])   . DEPRESSION 08/31/2007  . Endometriosis   . FIBROMYALGIA 08/31/2007  . Fibromyalgia   . GERD 02/05/2009  . Hyperlipidemia   . HYPERTENSION 08/31/2007  . IBS 03/11/2009  . Internal hemorrhoids   . Migraine headache   . 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 PONV (postoperative nausea and vomiting)   . RECTAL BLEEDING 03/11/2009  . Seizures ([place])    been about 1 year since last seisure per pt  . SLEEP APNEA 08/31/2007  . Thyroid disease   . Uterine cyst      Social History   Socioeconomic History  . Marital status: Married    Spouse name: Not on file  . Number of children: 2  . Years of education: Not on file  . Highest education level: Not on file  Social Needs  . Financial resource strain: Not on file  . Food insecurity - worry: Not on file  . Food insecurity - inability: Not on file  . Transportation needs - medical: Not on file  . Transportation needs - non-medical: Not on file  Occupational History    Employer: UNEMPLOYED  Tobacco Use  . Smoking status: Current Every Day Smoker    Packs/day: 1.00    Years: 14.00    Pack years: 14.00    Types: Cigarettes  . Smokeless tobacco: Never Used  . Tobacco comment: trying to quit  Substance and Sexual Activity  . Alcohol use: Yes    Alcohol/week: 0.0 oz    Comment: rare wine   . Drug use: Yes    Types: Marijuana    Comment: once a month  . Sexual activity: Not on file  Other Topics Concern  . Not on file  Social History Narrative   Homemaker   Daily Caffeine Use-Mtn. Dew          Past Surgical History:  Procedure Laterality Date  . ABDOMINAL HYSTERECTOMY    . bladder stretching x6    . BLADDER SURGERY     stimulator placed and stretching   . CHOLECYSTECTOMY    . COLONOSCOPY    . COLONOSCOPY WITH PROPOFOL N/A 09/03/2014     Procedure: COLONOSCOPY WITH PROPOFOL;  Surgeon: Milus Banister, MD;  Location: [facility];  Service: Endoscopy;  Laterality: N/A;  . interstitial cystitis    . PACEMAKER INSERTION     in hip for interstitial cystitis  . removal of uterine cyst and scrapped uterus    . replaced bladder pacemaker      Family History  Problem Relation Age of Onset  . Heart disease Father   . Asthma Maternal Grandmother   . Emphysema Maternal Grandfather   . Cancer Maternal Grandfather        Lung Cancer  . Cancer Other        Lung Cancer-Aunt  . Colon cancer Neg Hx   . Esophageal cancer Neg Hx   . Rectal cancer Neg Hx   . Stomach cancer Neg Hx   . Thyroid disease Neg Hx     Allergies  Allergen Reactions  . Abilify [Aripiprazole] Swelling, Other (See Comments) and Palpitations    tremors Throat swelling, tremors  . Metoclopramide Hcl Other (See Comments)    Causes seizures  . Propoxyphene Rash  . Tramadol Swelling, Other (See Comments) and Rash    Throat swelling, tremors  . Ambien [Zolpidem Tartrate] Other (See Comments)    hallucinations  . Eszopiclone Other (See Comments)    Hallucinations, hyper, bad taste in mouth   . Amitriptyline     Other reaction(s): Angioedema (ALLERGY/intolerance)  . Metoclopramide     Other reaction(s): Other (See Comments) Seizures  . Other Swelling    Acetaminophen #3 Acetaminophen #3  . Varenicline Other (See Comments)    Suicidal thoughts  . Buprenorphine Hcl Itching and Hives  . Demerol [Meperidine] Rash  . Emetrol Itching, Rash and Hives    Other reaction(s): Rash (ALLERGY/intolerance)  . Morphine And Related Hives and Itching  . Propoxyphene N-Acetaminophen Hives, Itching and Rash    Other reaction(s): Rash (ALLERGY/intolerance)    Current Outpatient Medications on File Prior to Visit  Medication Sig Dispense Refill  . acetaminophen-codeine (TYLENOL #4) 300-60 MG tablet     . albuterol (PROVENTIL HFA;VENTOLIN HFA) 108 (90 Base) MCG/ACT  inhaler Inhale 2 puffs into the lungs every 6 (six) hours as needed for wheezing or shortness of breath. 1 Inhaler 5  . beclomethasone (QVAR) 40 MCG/ACT inhaler Inhale 2 puffs into the lungs 2 (two) times daily. 1 Inhaler 12  . clonazePAM (KLONOPIN) 1 MG tablet Take 1 tablet (1 mg total) by mouth 2 (two) times daily as needed for anxiety. 60 tablet 0  . cyclobenzaprine (FLEXERIL) 10 MG tablet Take 1 tablet (10 mg total) by mouth 2 (two) times daily as needed for muscle spasms. 20 tablet 0  . Methen-Hyosc-Meth Blue-Na Phos (ME/NAPHOS/MB/HYO1) 81.6 MG TABS     .  methocarbamol (ROBAXIN) 500 MG tablet Take 1-2 tablets (500-1,000 mg total) by mouth every 6 (six) hours as needed for muscle spasms (and pain). 10 tablet 0  . omeprazole (PRILOSEC) 40 MG capsule Take 40 mg by mouth daily.    Marland Kitchen OVER THE COUNTER MEDICATION Testosterone 2% jelly    . oxyCODONE-acetaminophen (PERCOCET) 5-325 MG tablet Take 1 tablet by mouth 2 (two) times daily as needed for severe pain. 20 tablet 0  . PREMARIN vaginal cream     . risperiDONE (RISPERDAL) 0.5 MG tablet Take by mouth.    Marland Kitchen scopolamine (TRANSDERM-SCOP, 1.5 MG,) 1 MG/3DAYS Place 1 patch (1.5 mg total) onto the skin every 3 (three) days. 10 patch 12  . SPIRIVA RESPIMAT 2.5 MCG/ACT AERS     . Suvorexant 15 MG TABS Take 15 mg by mouth.    . traZODone (DESYREL) 50 MG tablet Take 0.5-1 tablets (25-50 mg total) by mouth at bedtime as needed for sleep. 30 tablet 0  . UNKNOWN TO PATIENT     . [DISCONTINUED] amitriptyline (ELAVIL) 25 MG tablet 2 tablets by mouth at bedtime     . [DISCONTINUED] clidinium-chlordiazePOXIDE (LIBRAX) 2.5-5 MG per capsule 2 capsules by mouth every morning and 1 at bedtime      No current facility-administered medications on file prior to visit.     BP 99/74   Pulse 86   Temp 98.5 F (36.9 C) (Oral)   Resp 16   Wt 104 lb 9.6 oz (47.4 kg)   SpO2 98%   BMI 17.95 kg/m       Objective:   Physical Exam  General Appearance- Not in acute  distress.  HEENT Eyes- Scleraeral/Conjuntiva-bilat- Not Yellow. Mouth & Throat- Normal.  Chest and Lung Exam Auscultation: Breath sounds:-Normal. Adventitious sounds:- No Adventitious sounds.  Cardiovascular Auscultation:Rythm - Regular. Heart Sounds -Normal heart sounds.  Abdomen Inspection:-Inspection Normal.  Palpation/Perucssion: Palpation and Percussion of the abdomen reveal- faint suprapubic tenderness, No Rebound tenderness, No rigidity(Guarding) and No Palpable abdominal masses.  Liver:-Normal.  Spleen:- Normal.   Back-very faint CVA tenderness.(Note patient's urine looked clear today.)  She does have a history of some back pain.  .     Assessment & Plan:  With your symptoms recently, I do think it is best to start treatment with Flagyl for probable bacterial vaginosis.  With your urinary discomfort and history of interstitial cystitis I do think it is a good idea to go ahead and get urine culture.  Also will include ancillary studies for gonorrhea, chlamydia, trichomonas, candida and BV.  Hydrate well and can use Azo-Standard over-the-counter if needed for urinary pain.  I will follow the urine culture and ancillary studies and let you know the results as they come in.  Follow-up in 7-10 days or as needed.

## 2017-02-22 NOTE — Patient Instructions (Signed)
With your symptoms recently, I do think it is best to start treatment with Flagyl for probable bacterial vaginosis.  With your urinary discomfort and history of interstitial cystitis I do think it is a good idea to go ahead and get urine culture.  Also will include ancillary studies for gonorrhea, chlamydia, trichomonas, candida and BV.  Hydrate well and can use Azo-Standard over-the-counter if needed for urinary pain.  I will follow the urine culture and ancillary studies and let you know the results as they come in.  Follow-up in 7-10 days or as needed.

## 2017-02-25 LAB — URINE CULTURE
MICRO NUMBER:: 81295449
SPECIMEN QUALITY:: ADEQUATE

## 2017-02-26 ENCOUNTER — Telehealth: Payer: Self-pay

## 2017-02-26 LAB — URINE CYTOLOGY ANCILLARY ONLY
Chlamydia: NEGATIVE
Neisseria Gonorrhea: NEGATIVE
Trichomonas: NEGATIVE

## 2017-02-26 NOTE — Telephone Encounter (Signed)
Pt states she still having pain in abdomin odor and medication makes her sick on the stomach.

## 2017-02-26 NOTE — Telephone Encounter (Signed)
Please see the results message I sent you.

## 2017-02-28 ENCOUNTER — Telehealth: Payer: Self-pay | Admitting: Medical

## 2017-02-28 LAB — URINE CYTOLOGY ANCILLARY ONLY
Bacterial vaginitis: POSITIVE — AB
Candida vaginitis: NEGATIVE

## 2017-02-28 MED ORDER — METRONIDAZOLE 500 MG PO TABS: 500.0000 mg | ORAL_TABLET | Freq: Three times a day (TID) | ORAL | 0 refills | Status: DC

## 2017-02-28 MED ORDER — CLINDAMYCIN HCL 300 MG PO CAPS: ORAL_CAPSULE | ORAL | 0 refills | Status: DC

## 2017-02-28 NOTE — Telephone Encounter (Signed)
Notified. Pt called clindamycin was sent in to pharmacy. Notified pt on her pharmacist opinion that is safe. Also reviewed with pharmacist he is not aware of any red dye in tab.

## 2017-02-28 NOTE — Telephone Encounter (Signed)
I talked to pt pharmacy about emetrol allergy and clindamycin. He states should not occur. So I went ahead and called in clindamycin 300 mg bid x 7 days.([facility]).

## 2017-04-11 DIAGNOSIS — Z79899 Other long term (current) drug therapy: Secondary | ICD-10-CM | POA: Diagnosis not present

## 2017-04-19 ENCOUNTER — Ambulatory Visit (INDEPENDENT_AMBULATORY_CARE_PROVIDER_SITE_OTHER): Payer: BLUE CROSS/BLUE SHIELD | Admitting: Family Medicine

## 2017-04-19 ENCOUNTER — Encounter: Payer: Self-pay | Admitting: Family Medicine

## 2017-04-19 ENCOUNTER — Ambulatory Visit: Payer: 59 | Admitting: Family Medicine

## 2017-04-19 DIAGNOSIS — F1910 Other psychoactive substance abuse, uncomplicated: Secondary | ICD-10-CM | POA: Diagnosis not present

## 2017-04-19 NOTE — Assessment & Plan Note (Signed)
With anxiety/ depression  D/w psych--- pt is not suicidal  They will see her Tuesday and she now knows the crisis line to call if she has any problems

## 2017-04-19 NOTE — Patient Instructions (Signed)
Substance Use Disorder and Mental Illness Substance use disorder is a condition in which a person is dependent on a substance, such as drugs or alcohol. A mental illness is a condition that occurs when someone experiences changes in mood, behavior, or thinking. Sometimes, these two conditions can occur at the same time (co-occurring disorders) and may be diagnosed together (dual diagnosis). What is the relationship between substance use disorder and mental illness? Substance use disorder and mental illness can share symptoms and can have similar causes, such as exposure to stress or changes in brain chemicals. The risk for developing both of these conditions can be passed from parent to child (inherited). People with mental illnesses sometimes use drugs to try and relieve symptoms, and this can lead to substance use disorder. Substance use disorders occur more often in people who have depression, schizophrenia, anxiety, or personality disorders. When some drugs are used regularly, they can cause people to have symptoms of mental illness. What are the signs or symptoms? Symptoms vary widely for substance use disorder and mental illness, especially because these conditions can be present at the same time. Signs of a substance use disorder may include:  Failure to meet responsibilities at home, work, or school.  Using substances in risky situation, such as while driving or using machinery.  Taking serious risks to get drugs or alcohol.  Spending less time on activities or hobbies that used to be important.  Changes in personality or attitude for no reason, such as angry outbursts, symptoms of anxiety, or unusual giddiness.  Trying to hide the amount of drugs or alcohol used.  Increased substance use over time, or needing to use more of a substance to feel the same effects (developing a tolerance).  Sudden weight loss or gain.  Sleeping too much or too little.  Uncontrolled trembling or shaking  (tremors), slurred speech, or lack of coordination.  Continuing to use alcohol or a drug even though using it has led to bad outcomes or consequences, such as losing a job or ending a relationship.  Signs of mental illness may include:  Extreme mood changes (mood swings).  Sleeping too much or too little.  Weight loss or weight gain.  Being easily distracted.  Confused thinking or trouble concentrating.  Expressing thoughts of suicide.  Withdrawing from friends and family, or sudden changes in social behaviors or hobbies.  Aggression toward people and animals.  Repeatedly breaking serious rules or breaking the law.  Having persistent thoughts or urges that are unpleasant or feel out of control (involuntary). The person may feel the need to act on the urges in order to reduce anxiety.  Persistent feelings of sadness or hopelessness.  False beliefs (delusions).  Seeing, hearing, tasting, smelling, or feeling things that are not real (hallucinations).  How is this diagnosed? Substance use disorder and mental illnesses can be difficult to diagnose at the same time because of how varied and complex the symptoms are. Because these conditions can interact, one condition may be missed. This is why it is important to be completely honest with your health care provider about substance use and your other symptoms. Diagnosing your condition may include:  A physical exam.  A review of your medical history and your symptoms.  Your health care provider may refer you to a mental health professional for a psychiatric evaluation. This may include assessments of:  Your use of substances.  Your risk of suicide.  Your risk of aggressive behaviors.  Your lifestyle, environment, and social  situations.  Your medical health.  Your mental health and behavioral history.  How is this treated? It is best to treat substance use disorder and mental illness at the same time (integrated  treatment approach). Treatment usually involves more than one of the following methods:  Detox. This refers to stopping substance abuse while being monitored by trained medical staff. This is usually the first step in treatment. Detox can last for up to 7 days.  Rehabilitation. This involves staying in a treatment center where you can have medical and mental health support all the time.  Medicines to relieve symptoms of mental illness and to control symptoms that are caused by stopping substance abuse (withdrawal symptoms).  Support groups. These groups encourage you to talk about your fears, frustrations, and anxieties with others who have the same condition.  Talk therapy. This is one-on-one therapy that can help you learn about your illness and learn ways to cope with symptoms or side effects.  Follow these instructions at home: Lifestyle  Exercise regularly. Aim for 150 minutes of moderate exercise (such as walking or biking) or 75 minutes of vigorous exercise (such as running) each week.  Eat a healthy diet with plenty of fruits and vegetables, whole grains, and lean proteins.  Avoid caffeine and tobacco. These can worsen symptoms and anxiety.  Do not drink alcohol or use drugs.  Try to get 7-9 hours of sleep each night. To do this: ? Keep your bedroom cool and dark. ? Do not eat a heavy meal during the hour before you go to bed. ? Do not have caffeine before bedtime. ? Avoid screen time during the few hours before bedtime. This means not watching TV and not using a computer, cell phone, or tablet. Medicines  Take over-the-counter and prescription medicines only as told by your health care provider.  Do not stop taking medicines unless you ask your health care provider if it is safe to do that.  Tell your health care provider about any medicine side effects that you experience. General instructions  Follow your treatment plan as directed. Work with your health care provider  to adjust your treatment plan as needed.  Attend support group or therapy sessions as directed.  Explain your diagnosis to your friends and family. Let them know what your symptoms are and what things cause your symptoms to start (triggers).  Avoid triggers or stressors that may worsen your symptoms or cause you to use alcohol or drugs. Spend time with family and friends who do not use substances.  Make time to relax and do self-soothing activities, such as meditating or listening to music.  Keep all follow-up visits as told by your health care provider and therapist. This is important. Where to find support: You may find support for coping with substance use disorder and mental illness from:  Your health care providers or your therapist. These providers can treat you or they can help you find services to treat your condition.  Local support groups for people with your condition. Your health care provider or therapist may be able to recommend a support group. This may be a hospital support group, a [employer] on [place] support group, or a 12-step group such as Alcoholics Anonymous (AA) or Narcotics Anonymous (NA).  Family and friends. Let them know what they can do to best support you through your recovery process.  Where to find more information: You may find more information about substance use disorder and mental illness from:  Substance Abuse and Mental Health Services Administration [facility]): ? Online: ktimeonline.com ? [employer], to talk with a person who can help you find information about treatment services in your area: (267) 183-2691 825-080-2739)  U.S. Department of Health and Financial controller mental health services: http://greene.com/  National Alliance on Mental Illness (NAMI): www.nami.[place] on Alcoholism and Drug Dependence: www.ncadd.org  Contact a health care provider if:  Your symptoms get worse or they do not get  better.  You have negative side effects from taking medicines.  You want to discuss stopping medicines or treatment.  You start using a substance again (have a relapse). Get help right away if:  You have thoughts about harming yourself or others. If you ever feel like you may hurt yourself or others, or have thoughts about taking your own life, get help right away. You can go to your nearest emergency department or call:  Your local emergency services (911 in the [place]  A suicide crisis helpline, such as the [place] at 214-521-7670. This is open 24 hours a day.  Summary  Substance use disorder and mental illness can occur at the same time (co-occurring disorders), and they may be diagnosed together (dual diagnosis).  These conditions can be difficult to diagnose at the same time. It is important to be completely honest with your health care provider about your substance use and your other symptoms.  It is best to treat substance use disorder and mental illness at the same time (integrated treatment approach).  Identifying new ways to deal with triggers and difficult situations is an important part of recovery.  Keep all follow-up visits as told by your health care provider and therapist. Be consistent when attending support groups or treatment programs. This information is not intended to replace advice given to you by your health care provider. Make sure you discuss any questions you have with your health care provider. Document Released: 08/11/2016 Document Revised: 08/11/2016 Document Reviewed: 08/11/2016 Elsevier Interactive Patient Education  2018 [employer].

## 2017-04-19 NOTE — Progress Notes (Signed)
Patient ID: [name], female   DOB: 04/22/1975, 42 y.o.   MRN: [id number]    Subjective:  I acted as a Education administrator for Dr. Carollee Herter.  [name], [place]   Patient ID: [name], female    DOB: 12/22/1975, 42 y.o.   MRN: [id number]  Chief Complaint  Patient presents with  . Addiction Problem    HPI  Patient is in today for substance abuse, Tylenol #4 and clonazepam.  She feels like she is dependent on it.  All she can think about is when to take it next.  The only time she feels normal when she takes it.  Cannot hardly concentrate.  Patient is very tearful.  She has tried to stop it cold [place] and it did not work.   Patient Care Team: Carollee Herter, Alferd Apa, DO as PCP - General Marisue Ivan, MD as Referring Physician (Urology)   Past Medical History:  Diagnosis Date  . 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 Anal fissure 03/11/2009  . Anemia   . Anxiety   . Anxiety and depression   . ARTHRITIS 08/31/2007  . Arthritis   . Asthma   . 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 Bipolar 1 disorder ([place])   . Bowel obstruction ([place])   . BRONCHITIS, RECURRENT 08/23/2009   Asthmatic Bronchitis-Dr. Melvyn Novas.....-HFA 75% 12/04/2008>75% 02/05/2009>75% 08/04/2009 -PFT's 01/04/2009 2.56 (86%) ratio 75, no resp to B2 and DLC0 67% > 80 after correction   . Cancer (HCC)    cervical cancer  . Chronic interstitial cystitis 03/11/2009  . Chronic nausea   . Chronic pain   . Colon polyps   . COLONIC POLYPS, HX OF 07/25/2006   ADENOMATOUS POLYP  . COPD (chronic obstructive pulmonary disease) ([place])   . DEPRESSION 08/31/2007  . Endometriosis   . FIBROMYALGIA 08/31/2007  . Fibromyalgia   . GERD 02/05/2009  . Hyperlipidemia   . HYPERTENSION 08/31/2007  . IBS 03/11/2009  . Internal hemorrhoids   . Migraine headache   . 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 PONV (postoperative nausea and vomiting)   . RECTAL BLEEDING 03/11/2009  . Seizures ([place])    been about 1 year since last seisure per pt  . SLEEP APNEA 08/31/2007   . Thyroid disease   . Uterine cyst     Past Surgical History:  Procedure Laterality Date  . ABDOMINAL HYSTERECTOMY    . bladder stretching x6    . BLADDER SURGERY     stimulator placed and stretching   . CHOLECYSTECTOMY    . COLONOSCOPY    . COLONOSCOPY WITH PROPOFOL N/A 09/03/2014   Procedure: COLONOSCOPY WITH PROPOFOL;  Surgeon: Milus Banister, MD;  Location: [facility];  Service: Endoscopy;  Laterality: N/A;  . interstitial cystitis    . PACEMAKER INSERTION     in hip for interstitial cystitis  . removal of uterine cyst and scrapped uterus    . replaced bladder pacemaker      Family History  Problem Relation Age of Onset  . Heart disease Father   . Asthma Maternal Grandmother   . Emphysema Maternal Grandfather   . Cancer Maternal Grandfather        Lung Cancer  . Cancer Other        Lung Cancer-Aunt  . Colon cancer Neg Hx   . Esophageal cancer Neg Hx   . Rectal cancer Neg Hx   . Stomach cancer Neg Hx   . Thyroid disease Neg Hx     Social History  Socioeconomic History  . Marital status: Married    Spouse name: Not on file  . Number of children: 2  . Years of education: Not on file  . Highest education level: Not on file  Social Needs  . Financial resource strain: Not on file  . Food insecurity - worry: Not on file  . Food insecurity - inability: Not on file  . Transportation needs - medical: Not on file  . Transportation needs - non-medical: Not on file  Occupational History    Employer: UNEMPLOYED  Tobacco Use  . Smoking status: Current Every Day Smoker    Packs/day: 1.00    Years: 14.00    Pack years: 14.00    Types: Cigarettes  . Smokeless tobacco: Never Used  . Tobacco comment: trying to quit  Substance and Sexual Activity  . Alcohol use: Yes    Alcohol/week: 0.0 oz    Comment: rare wine   . Drug use: Yes    Types: Marijuana    Comment: once a month  . Sexual activity: Not on file  Other Topics Concern  . Not on file  Social History  Narrative   Homemaker   Daily Caffeine Use-Mtn. Dew          Outpatient Medications Prior to Visit  Medication Sig Dispense Refill  . acetaminophen-codeine (TYLENOL #4) 300-60 MG tablet     . albuterol (PROVENTIL HFA;VENTOLIN HFA) 108 (90 Base) MCG/ACT inhaler Inhale 2 puffs into the lungs every 6 (six) hours as needed for wheezing or shortness of breath. 1 Inhaler 5  . clonazePAM (KLONOPIN) 1 MG tablet Take 1 tablet (1 mg total) by mouth 2 (two) times daily as needed for anxiety. 60 tablet 0  . cyclobenzaprine (FLEXERIL) 10 MG tablet Take 1 tablet (10 mg total) by mouth 2 (two) times daily as needed for muscle spasms. 20 tablet 0  . methocarbamol (ROBAXIN) 500 MG tablet Take 1-2 tablets (500-1,000 mg total) by mouth every 6 (six) hours as needed for muscle spasms (and pain). 10 tablet 0  . omeprazole (PRILOSEC) 40 MG capsule Take 40 mg by mouth daily.    Marland Kitchen OVER THE COUNTER MEDICATION Testosterone 2% jelly    . PREMARIN vaginal cream     . QUEtiapine (SEROQUEL) 50 MG tablet Take by mouth.    . risperiDONE (RISPERDAL) 0.5 MG tablet Take by mouth.    Marland Kitchen scopolamine (TRANSDERM-SCOP, 1.5 MG,) 1 MG/3DAYS Place 1 patch (1.5 mg total) onto the skin every 3 (three) days. 10 patch 12  . SPIRIVA RESPIMAT 2.5 MCG/ACT AERS     . Suvorexant 15 MG TABS Take 15 mg by mouth.    . traZODone (DESYREL) 50 MG tablet Take 0.5-1 tablets (25-50 mg total) by mouth at bedtime as needed for sleep. 30 tablet 0  . beclomethasone (QVAR) 40 MCG/ACT inhaler Inhale 2 puffs into the lungs 2 (two) times daily. 1 Inhaler 12  . clindamycin (CLEOCIN) 300 MG capsule 1 tab po bid x 7 days 14 capsule 0  . Methen-Hyosc-Meth Blue-Na Phos (ME/NAPHOS/MB/HYO1) 81.6 MG TABS     . metroNIDAZOLE (FLAGYL) 500 MG tablet Take 1 tablet (500 mg total) by mouth 3 (three) times daily. 21 tablet 0  . oxyCODONE-acetaminophen (PERCOCET) 5-325 MG tablet Take 1 tablet by mouth 2 (two) times daily as needed for severe pain. 20 tablet 0  . UNKNOWN TO  PATIENT      No facility-administered medications prior to visit.     Allergies  Allergen Reactions  .  Abilify [Aripiprazole] Swelling, Other (See Comments) and Palpitations    tremors Throat swelling, tremors  . Metoclopramide Hcl Other (See Comments)    Causes seizures  . Propoxyphene Rash  . Tramadol Swelling, Other (See Comments) and Rash    Throat swelling, tremors  . Ambien [Zolpidem Tartrate] Other (See Comments)    hallucinations  . Eszopiclone Other (See Comments)    Hallucinations, hyper, bad taste in mouth   . Amitriptyline     Other reaction(s): Angioedema (ALLERGY/intolerance)  . Metoclopramide     Other reaction(s): Other (See Comments) Seizures  . Other Swelling    Acetaminophen #3 Acetaminophen #3  . Varenicline Other (See Comments)    Suicidal thoughts  . Buprenorphine Hcl Itching and Hives  . Demerol [Meperidine] Rash  . Emetrol Itching, Rash and Hives    Other reaction(s): Rash (ALLERGY/intolerance)  . Morphine And Related Hives and Itching  . Propoxyphene N-Acetaminophen Hives, Itching and Rash    Other reaction(s): Rash (ALLERGY/intolerance)    Review of Systems  Constitutional: Negative for fever and malaise/fatigue.  HENT: Negative for congestion.   Eyes: Negative for blurred vision.  Respiratory: Negative for cough and shortness of breath.   Cardiovascular: Negative for chest pain, palpitations and leg swelling.  Gastrointestinal: Negative for vomiting.  Musculoskeletal: Negative for back pain.  Skin: Negative for rash.  Neurological: Negative for loss of consciousness and headaches.  Psychiatric/Behavioral: Positive for substance abuse.       Objective:    Physical Exam  Constitutional: She is oriented to person, place, and time. She appears well-developed and well-nourished.  HENT:  Head: Normocephalic and atraumatic.  Eyes: Conjunctivae and EOM are normal.  Neck: Normal range of motion. Neck supple. No JVD present. Carotid bruit  is not present. No thyromegaly present.  Cardiovascular: Normal rate, regular rhythm and normal heart sounds.  No murmur heard. Pulmonary/Chest: Effort normal and breath sounds normal. No respiratory distress. She has no wheezes. She has no rales. She exhibits no tenderness.  Musculoskeletal: She exhibits no edema.  Neurological: She is alert and oriented to person, place, and time.  Psychiatric: Her speech is normal. Judgment normal. Her mood appears anxious. She is agitated. She is not aggressive, not hyperactive and not combative. Thought content is not paranoid and not delusional. Cognition and memory are normal. She expresses no homicidal and no suicidal ideation. She expresses no suicidal plans and no homicidal plans.  Pt crying uncontrollably  She is NOT suicidal or homicidal  She is attentive.  Nursing note and vitals reviewed.   BP 106/80 (BP Location: Right Arm, Cuff Size: Normal)   Pulse (!) 106   Temp 98.1 F (36.7 C) (Oral)   Resp 16   Ht 5\' 4"  (1.626 m)   Wt 106 lb 6.4 oz (48.3 kg)   SpO2 97%   BMI 18.26 kg/m  Wt Readings from Last 3 Encounters:  04/19/17 106 lb 6.4 oz (48.3 kg)  02/22/17 104 lb 9.6 oz (47.4 kg)  09/25/16 98 lb (44.5 kg)   BP Readings from Last 3 Encounters:  04/19/17 106/80  02/22/17 99/74  09/28/16 104/73     Immunization History  Administered Date(s) Administered  . Tdap 05/05/2015    Health Maintenance  Topic Date Due  . HIV Screening  04/24/1991  . INFLUENZA VACCINE  11/08/2016  . PAP SMEAR  09/24/2017  . COLONOSCOPY  09/03/2019  . TETANUS/TDAP  05/04/2025    Lab Results  Component Value Date   WBC 8.4 07/31/2016  HGB 12.5 07/31/2016   HCT 36.7 07/31/2016   PLT 280 07/31/2016   GLUCOSE 96 07/31/2016   CHOL 127 07/19/2009   TRIG 130.0 07/19/2009   HDL 42.80 07/19/2009   LDLCALC 58 07/19/2009   ALT 7 (L) 07/31/2016   AST 14 (L) 07/31/2016   NA 137 07/31/2016   K 3.4 (L) 07/31/2016   CL 106 07/31/2016   CREATININE 0.71  07/31/2016   BUN 9 07/31/2016   CO2 25 07/31/2016   TSH 1.39 06/03/2015   HGBA1C 4.9 08/10/2014    Lab Results  Component Value Date   TSH 1.39 06/03/2015   Lab Results  Component Value Date   WBC 8.4 07/31/2016   HGB 12.5 07/31/2016   HCT 36.7 07/31/2016   MCV 92.0 07/31/2016   PLT 280 07/31/2016   Lab Results  Component Value Date   NA 137 07/31/2016   K 3.4 (L) 07/31/2016   CO2 25 07/31/2016   GLUCOSE 96 07/31/2016   BUN 9 07/31/2016   CREATININE 0.71 07/31/2016   BILITOT 0.3 07/31/2016   ALKPHOS 73 07/31/2016   AST 14 (L) 07/31/2016   ALT 7 (L) 07/31/2016   PROT 6.5 07/31/2016   ALBUMIN 3.7 07/31/2016   CALCIUM 9.2 07/31/2016   ANIONGAP 6 07/31/2016   GFR 55.25 (L) 06/03/2015   Lab Results  Component Value Date   CHOL 127 07/19/2009   Lab Results  Component Value Date   HDL 42.80 07/19/2009   Lab Results  Component Value Date   LDLCALC 58 07/19/2009   Lab Results  Component Value Date   TRIG 130.0 07/19/2009   Lab Results  Component Value Date   CHOLHDL 3 07/19/2009   Lab Results  Component Value Date   HGBA1C 4.9 08/10/2014         Assessment & Plan:   Problem List Items Addressed This Visit      Unprioritized   Substance abuse ([place])    With anxiety/ depression  D/w psych--- pt is not suicidal  They will see her Tuesday and she now knows the crisis line to call if she has any problems          I have discontinued Freada R. Soave's UNKNOWN TO PATIENT, ME/NAPHOS/MB/HYO1, beclomethasone, oxyCODONE-acetaminophen, metroNIDAZOLE, and clindamycin. I am also having her maintain her omeprazole, traZODone, clonazePAM, methocarbamol, acetaminophen-codeine, risperiDONE, Suvorexant, PREMARIN, OVER THE COUNTER MEDICATION, scopolamine, albuterol, SPIRIVA RESPIMAT, cyclobenzaprine, and QUEtiapine.  No orders of the defined types were placed in this encounter.   CMA served as Education administrator during this visit. History, Physical and Plan performed by medical  provider. Documentation and orders reviewed and attested to.  [name], DO

## 2017-04-20 ENCOUNTER — Ambulatory Visit: Payer: 59 | Admitting: Family Medicine

## 2017-04-23 ENCOUNTER — Encounter (HOSPITAL_COMMUNITY): Payer: Self-pay

## 2017-04-23 ENCOUNTER — Inpatient Hospital Stay (HOSPITAL_COMMUNITY)
Admission: RE | Admit: 2017-04-23 | Discharge: 2017-04-25 | DRG: 880 | Disposition: A | Discharge status: Home / Self Care | Payer: BLUE CROSS/BLUE SHIELD | Attending: Psychiatry | Admitting: Psychiatry

## 2017-04-23 ENCOUNTER — Other Ambulatory Visit: Payer: Self-pay

## 2017-04-23 DIAGNOSIS — F1210 Cannabis abuse, uncomplicated: Secondary | ICD-10-CM | POA: Diagnosis not present

## 2017-04-23 DIAGNOSIS — Z90710 Acquired absence of both cervix and uterus: Secondary | ICD-10-CM | POA: Diagnosis not present

## 2017-04-23 DIAGNOSIS — G4700 Insomnia, unspecified: Secondary | ICD-10-CM | POA: Diagnosis not present

## 2017-04-23 DIAGNOSIS — Z79899 Other long term (current) drug therapy: Secondary | ICD-10-CM | POA: Diagnosis not present

## 2017-04-23 DIAGNOSIS — Z62810 Personal history of physical and sexual abuse in childhood: Secondary | ICD-10-CM | POA: Diagnosis not present

## 2017-04-23 DIAGNOSIS — Z818 Family history of other mental and behavioral disorders: Secondary | ICD-10-CM | POA: Diagnosis not present

## 2017-04-23 DIAGNOSIS — M797 Fibromyalgia: Secondary | ICD-10-CM | POA: Diagnosis present

## 2017-04-23 DIAGNOSIS — N3010 Interstitial cystitis (chronic) without hematuria: Secondary | ICD-10-CM | POA: Diagnosis present

## 2017-04-23 DIAGNOSIS — I10 Essential (primary) hypertension: Secondary | ICD-10-CM | POA: Diagnosis present

## 2017-04-23 DIAGNOSIS — J209 Acute bronchitis, unspecified: Secondary | ICD-10-CM

## 2017-04-23 DIAGNOSIS — J449 Chronic obstructive pulmonary disease, unspecified: Secondary | ICD-10-CM | POA: Diagnosis present

## 2017-04-23 DIAGNOSIS — E785 Hyperlipidemia, unspecified: Secondary | ICD-10-CM | POA: Diagnosis present

## 2017-04-23 DIAGNOSIS — Z950 Presence of cardiac pacemaker: Secondary | ICD-10-CM | POA: Diagnosis not present

## 2017-04-23 DIAGNOSIS — Z86010 Personal history of colonic polyps: Secondary | ICD-10-CM | POA: Diagnosis not present

## 2017-04-23 DIAGNOSIS — F4310 Post-traumatic stress disorder, unspecified: Secondary | ICD-10-CM | POA: Diagnosis not present

## 2017-04-23 DIAGNOSIS — Z8541 Personal history of malignant neoplasm of cervix uteri: Secondary | ICD-10-CM

## 2017-04-23 DIAGNOSIS — F3130 Bipolar disorder, current episode depressed, mild or moderate severity, unspecified: Secondary | ICD-10-CM | POA: Diagnosis not present

## 2017-04-23 DIAGNOSIS — F332 Major depressive disorder, recurrent severe without psychotic features: Secondary | ICD-10-CM

## 2017-04-23 DIAGNOSIS — F3181 Bipolar II disorder: Secondary | ICD-10-CM | POA: Diagnosis not present

## 2017-04-23 DIAGNOSIS — J440 Chronic obstructive pulmonary disease with acute lower respiratory infection: Secondary | ICD-10-CM

## 2017-04-23 DIAGNOSIS — F17210 Nicotine dependence, cigarettes, uncomplicated: Secondary | ICD-10-CM | POA: Diagnosis not present

## 2017-04-23 DIAGNOSIS — Z560 Unemployment, unspecified: Secondary | ICD-10-CM | POA: Diagnosis not present

## 2017-04-23 DIAGNOSIS — F419 Anxiety disorder, unspecified: Principal | ICD-10-CM | POA: Diagnosis present

## 2017-04-23 DIAGNOSIS — F319 Bipolar disorder, unspecified: Secondary | ICD-10-CM

## 2017-04-23 DIAGNOSIS — G4730 Sleep apnea, unspecified: Secondary | ICD-10-CM | POA: Diagnosis present

## 2017-04-23 DIAGNOSIS — F329 Major depressive disorder, single episode, unspecified: Secondary | ICD-10-CM | POA: Diagnosis present

## 2017-04-23 DIAGNOSIS — K219 Gastro-esophageal reflux disease without esophagitis: Secondary | ICD-10-CM | POA: Diagnosis not present

## 2017-04-23 DIAGNOSIS — G8929 Other chronic pain: Secondary | ICD-10-CM | POA: Diagnosis present

## 2017-04-23 DIAGNOSIS — R450 Nervousness: Secondary | ICD-10-CM | POA: Diagnosis not present

## 2017-04-23 MED ORDER — NICOTINE 21 MG/24HR TD PT24
21.0000 mg | MEDICATED_PATCH | Freq: Every day | TRANSDERMAL | Status: DC
  Administered 2017-04-24 – 2017-04-25 (×2): 21 mg via TRANSDERMAL
  Filled 2017-04-23 (×5): qty 1

## 2017-04-23 MED ORDER — HYDROXYZINE HCL 25 MG PO TABS
25.0000 mg | ORAL_TABLET | Freq: Four times a day (QID) | ORAL | Status: DC | PRN
  Administered 2017-04-23 – 2017-04-24 (×2): 25 mg via ORAL
  Filled 2017-04-23: qty 1

## 2017-04-23 MED ORDER — NAPROXEN 500 MG PO TABS
500.0000 mg | ORAL_TABLET | Freq: Two times a day (BID) | ORAL | Status: DC | PRN
Start: 2017-04-23 — End: 2017-04-25
  Administered 2017-04-23 – 2017-04-24 (×2): 500 mg via ORAL
  Filled 2017-04-23 (×2): qty 1

## 2017-04-23 MED ORDER — RISPERIDONE 0.5 MG PO TABS
0.5000 mg | ORAL_TABLET | Freq: Every day | ORAL | Status: DC
  Administered 2017-04-23: 0.5 mg via ORAL
  Filled 2017-04-23 (×4): qty 1

## 2017-04-23 NOTE — Progress Notes (Signed)
[name] is a 42 year old female being admitted voluntarily to [facility] as a walk in to [facility].  She came to [facility] with increasing depression and anxiety.  She reported passive SI with no plan or intent and stated she just hoped that she wouldn't wake up.  During [facility] admission, she denied SI and will contract for safety on the unit.  She denied HI or A/V hallucinations.  She reported pain 5/10-chronic pain due to cystitis.  Oriented her to the unit.  Admission paperwork completed and signed.  Belongings searched and secured in locker # 13-no contraband found.  Skin assessment completed and no skin issues noted.  Q 15 minute checks initiated for safety.  We will monitor the progress towards her goals.

## 2017-04-23 NOTE — Tx Team (Signed)
Initial Treatment Plan 04/23/2017 8:37 PM ASHBY LEFLORE ION:[id number]    PATIENT STRESSORS: Health problems Medication change or noncompliance   PATIENT STRENGTHS: Capable of independent living General fund of knowledge Supportive family/friends   PATIENT IDENTIFIED PROBLEMS: Depression-passive SI  Anxiety  "I would like to not think everyday now am I going to make it through the day."                 DISCHARGE CRITERIA:  Improved stabilization in mood, thinking, and/or behavior Verbal commitment to aftercare and medication compliance Withdrawal symptoms are absent or subacute and managed without 24-hour nursing intervention  PRELIMINARY DISCHARGE PLAN: Outpatient therapy Medication management  PATIENT/FAMILY INVOLVEMENT: This treatment plan has been presented to and reviewed with the patient, [name].  The patient and family have been given the opportunity to ask questions and make suggestions.  Windell Moment, RN 04/23/2017, 8:37 PM

## 2017-04-23 NOTE — H&P (Signed)
Behavioral Health Medical Screening Exam  [name] is an 42 y.o. female.  Total Time spent with patient: 30 minutes- [facility] walking in with concerns of on going anxiety and depressions. Reports [facility] which she doesn't feel is helping. Noted patient is taken Tylenol 4 to help with her Cystits. " reports she dosnt feel as if she is abusing her pain medication.  Psychiatric Specialty Exam: Physical Exam  Vitals reviewed. Constitutional: She is oriented to person, place, and time. She appears well-developed.  Neurological: She is alert and oriented to person, place, and time.  Psychiatric: She has a normal mood and affect.    Review of Systems  Psychiatric/Behavioral: Positive for depression, substance abuse and suicidal ideas (passive). The patient is nervous/anxious and has insomnia.     There were no vitals taken for this visit.There is no height or weight on file to calculate BMI.  General Appearance: Guarded tearful and flat  Eye Contact:  Minimal  Speech:  Clear and Coherent  Volume:  Decreased  Mood:  Anxious, Depressed and Dysphoric  Affect:  Depressed and Flat  Thought Process:  Coherent  Orientation:  Full (Time, Place, and Person)  Thought Content:  Hallucinations: None  Suicidal Thoughts:  No passive thoughts  Homicidal Thoughts:  No  Memory:  Immediate;   Fair Recent;   Fair Remote;   Fair  Judgement:  Fair  Insight:  Present  Psychomotor Activity:  Normal  Concentration: Concentration: Fair  Recall:  [employer] of Knowledge:Fair  Language: Fair  Akathisia:  No  Handed:  Right  AIMS (if indicated):     Assets:  Communication Skills Desire for Improvement Financial Resources/Insurance Resilience Social Support  Sleep:       Musculoskeletal: Strength & Muscle Tone: within normal limits Gait & Station: normal Patient leans: N/A  There were no vitals taken for this visit. B/P 118/84 HR 76 RR 20 Temp- unable O2 sat  100% Recommendations:  Based on my evaluation the patient appears to have an emergency medical condition for which I recommend the patient be transferred to the emergency department for further evaluation.   Accepted to [place] Lindsay Thieme, NP 04/23/2017, 4:49 PM

## 2017-04-23 NOTE — Progress Notes (Signed)
D   Pt has isolated to her room this evening   She complains of pain   Pt requested if she would get her other medications that she usually takes for pain and her nerves    Pt behavior is appropriate   She complains of anxiety and depression   She has passive SI and contracts for safety  A   Verbal support given   Medications administered and effectiveness monitored   Q 15 min checks   Encouraged pt to speak with her doctor in the am regarding her medications R   Pt is safe and verbalized understanding
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
## 2017-04-24 DIAGNOSIS — G4700 Insomnia, unspecified: Secondary | ICD-10-CM

## 2017-04-24 DIAGNOSIS — F3130 Bipolar disorder, current episode depressed, mild or moderate severity, unspecified: Secondary | ICD-10-CM

## 2017-04-24 DIAGNOSIS — F17210 Nicotine dependence, cigarettes, uncomplicated: Secondary | ICD-10-CM

## 2017-04-24 DIAGNOSIS — R450 Nervousness: Secondary | ICD-10-CM

## 2017-04-24 DIAGNOSIS — F4310 Post-traumatic stress disorder, unspecified: Secondary | ICD-10-CM

## 2017-04-24 DIAGNOSIS — Z818 Family history of other mental and behavioral disorders: Secondary | ICD-10-CM

## 2017-04-24 DIAGNOSIS — Z62810 Personal history of physical and sexual abuse in childhood: Secondary | ICD-10-CM

## 2017-04-24 DIAGNOSIS — F419 Anxiety disorder, unspecified: Principal | ICD-10-CM

## 2017-04-24 DIAGNOSIS — F3181 Bipolar II disorder: Secondary | ICD-10-CM | POA: Diagnosis not present

## 2017-04-24 LAB — COMPREHENSIVE METABOLIC PANEL
ALT: 10 U/L — ABNORMAL LOW (ref 14–54)
AST: 22 U/L (ref 15–41)
Albumin: 4.4 g/dL (ref 3.5–5.0)
Alkaline Phosphatase: 109 U/L (ref 38–126)
Anion gap: 8 (ref 5–15)
BUN: 7 mg/dL (ref 6–20)
CO2: 25 mmol/L (ref 22–32)
Calcium: 9.8 mg/dL (ref 8.9–10.3)
Chloride: 104 mmol/L (ref 101–111)
Creatinine, Ser: 0.73 mg/dL (ref 0.44–1.00)
GFR calc Af Amer: >60 mL/min (ref >60)
GFR calc non Af Amer: >60 mL/min (ref >60)
Glucose, Bld: 125 mg/dL — ABNORMAL HIGH (ref 65–99)
Potassium: 3.3 mmol/L — ABNORMAL LOW (ref 3.5–5.1)
Sodium: 137 mmol/L (ref 135–145)
Total Bilirubin: 1 mg/dL (ref 0.3–1.2)
Total Protein: 7.7 g/dL (ref 6.5–8.1)

## 2017-04-24 LAB — CBC
HCT: 40.5 % (ref 36.0–46.0)
Hemoglobin: 13.7 g/dL (ref 12.0–15.0)
MCH: 31.1 pg (ref 26.0–34.0)
MCHC: 33.8 g/dL (ref 30.0–36.0)
MCV: 91.8 fL (ref 78.0–100.0)
Platelets: 369 10*3/uL (ref 150–400)
RBC: 4.41 MIL/uL (ref 3.87–5.11)
RDW: 12.4 % (ref 11.5–15.5)
WBC: 7.3 10*3/uL (ref 4.0–10.5)

## 2017-04-24 LAB — LIPID PANEL
Cholesterol: 143 mg/dL (ref 0–200)
HDL: 49 mg/dL (ref >40)
LDL Cholesterol: 77 mg/dL (ref 0–99)
Total CHOL/HDL Ratio: 2.9 RATIO
Triglycerides: 85 mg/dL (ref <150)
VLDL: 17 mg/dL (ref 0–40)

## 2017-04-24 LAB — TSH: TSH: 2.184 u[IU]/mL (ref 0.350–4.500)

## 2017-04-24 LAB — HEMOGLOBIN A1C
Hgb A1c MFr Bld: 5 % (ref 4.8–5.6)
Mean Plasma Glucose: 96.8 mg/dL

## 2017-04-24 MED ORDER — CLONAZEPAM 0.5 MG PO TABS
0.5000 mg | ORAL_TABLET | Freq: Every day | ORAL | Status: DC
  Administered 2017-04-24 – 2017-04-25 (×2): 0.5 mg via ORAL
  Filled 2017-04-24 (×2): qty 1

## 2017-04-24 MED ORDER — TRAZODONE HCL 50 MG PO TABS
50.0000 mg | ORAL_TABLET | Freq: Every day | ORAL | Status: DC
  Administered 2017-04-24: 50 mg via ORAL
  Filled 2017-04-24 (×2): qty 1

## 2017-04-24 MED ORDER — PANTOPRAZOLE SODIUM 40 MG PO TBEC
40.0000 mg | DELAYED_RELEASE_TABLET | Freq: Every day | ORAL | Status: DC
  Administered 2017-04-24 – 2017-04-25 (×2): 40 mg via ORAL
  Filled 2017-04-24 (×4): qty 1

## 2017-04-24 MED ORDER — GABAPENTIN 300 MG PO CAPS
300.0000 mg | ORAL_CAPSULE | Freq: Three times a day (TID) | ORAL | Status: DC
  Administered 2017-04-24 – 2017-04-25 (×3): 300 mg via ORAL
  Filled 2017-04-24 (×5): qty 1

## 2017-04-24 MED ORDER — RISPERIDONE 1 MG PO TABS
1.0000 mg | ORAL_TABLET | Freq: Every day | ORAL | Status: DC
  Filled 2017-04-24 (×2): qty 1

## 2017-04-24 MED ORDER — MIRTAZAPINE 15 MG PO TBDP
30.0000 mg | ORAL_TABLET | Freq: Every day | ORAL | Status: DC
  Administered 2017-04-24: 30 mg via ORAL
  Filled 2017-04-24 (×2): qty 2

## 2017-04-24 MED ORDER — HYDROXYZINE HCL 50 MG PO TABS
50.0000 mg | ORAL_TABLET | Freq: Four times a day (QID) | ORAL | Status: DC | PRN
  Administered 2017-04-24: 50 mg via ORAL
  Filled 2017-04-24: qty 1

## 2017-04-24 MED ORDER — RISPERIDONE 2 MG PO TABS
2.0000 mg | ORAL_TABLET | Freq: Every day | ORAL | Status: DC
  Administered 2017-04-24: 2 mg via ORAL
  Filled 2017-04-24 (×3): qty 1

## 2017-04-24 MED ORDER — FLUOXETINE HCL 10 MG PO CAPS
30.0000 mg | ORAL_CAPSULE | Freq: Every day | ORAL | Status: DC
  Administered 2017-04-24: 30 mg via ORAL
  Filled 2017-04-24 (×2): qty 3

## 2017-04-24 MED ORDER — QUETIAPINE FUMARATE 200 MG PO TABS
200.0000 mg | ORAL_TABLET | Freq: Every day | ORAL | Status: DC
  Administered 2017-04-24: 200 mg via ORAL
  Filled 2017-04-24 (×2): qty 1

## 2017-04-24 MED ORDER — ALBUTEROL SULFATE HFA 108 (90 BASE) MCG/ACT IN AERS: 2.0000 | INHALATION_SPRAY | Freq: Four times a day (QID) | RESPIRATORY_TRACT | Status: DC | PRN

## 2017-04-24 NOTE — Progress Notes (Signed)
Adult Psychoeducational Group Note  Date:  04/24/2017 Time:  12:21 AM  Group Topic/Focus:  Wrap-Up Group:   The focus of this group is to help patients review their daily goal of treatment and discuss progress on daily workbooks.  Participation Level:  Did Not Attend  Participation Quality:  Did Not Attend  Affect:  Did not attend  Cognitive:  Did not attend  Insight: None  Engagement in Group:  Did not attend  Modes of Intervention:  Did not attend  Additional Comments:  Pt did not attend evening wrap up group tonight.  Candy Sledge 04/24/2017, 12:21 AM

## 2017-04-24 NOTE — Progress Notes (Signed)
D: Patient denies SI, HI or AVH this morning.  Pt. Presents as flat, anxious and depressed.  Pt. Complains of anxiety and restlessness stating, "I can't sit still, I feel like I need something to do".  Pt. Encouraged to participate in group and start working on coping skills for anxiety.     A: Patient given emotional support from RN. Patient encouraged to come to staff with concerns and/or questions. Patient's medication routine continued. Patient's orders and plan of care reviewed.   R: Patient remains appropriate and cooperative. Will continue to monitor patient q15 minutes for safety.

## 2017-04-24 NOTE — BHH Suicide Risk Assessment (Signed)
[facility] Admission Suicide Risk Assessment   Nursing information obtained from:  Patient Demographic factors:  Caucasian Current Mental Status:  NA Loss Factors:  NA Historical Factors:  Prior suicide attempts Risk Reduction Factors:  Sense of responsibility to family  Total Time spent with patient: 1 hour Principal Problem: Bipolar disorder with depression ([place]) Diagnosis:   Patient Active Problem List   Diagnosis Date Noted  . MDD (major depressive disorder) [F32.9] 04/23/2017  . Substance abuse ([place]) [F19.10] 04/19/2017  . Hyperthyroidism [E05.90] 02/17/2015  . Anxiety [F41.9] 01/28/2015  . Clinical depression [F32.9] 01/28/2015  . COPD exacerbation ([place]) [J44.1] 11/26/2013  . Vaginitis and vulvovaginitis [N76.0] 11/26/2013  . Recurrent pneumonia [J18.9] 02/02/2013  . Left arm pain [M79.602] 01/13/2013  . Left arm pain [M79.602] 01/13/2013    Class: Acute  . Dizziness and giddiness [R42] 01/13/2013  . Acute bronchitis [J20.9] 12/07/2012  . Rib pain [R07.81] 12/07/2012  . Insomnia [G47.00] 08/26/2012  . Diarrhea [R19.7] 06/19/2012  . Heme positive stool [R19.5] 06/19/2012  . Bloating [R14.0] 06/19/2012  . Nausea alone [R11.0] 06/19/2012  . Bladder retention [R33.9] 04/24/2012  . Tremor [R25.1] 03/05/2012  . Dysuria [R30.0] 03/05/2012  . Failure to thrive in adult [R62.7] 06/21/2011  . Airway hyperreactivity [J45.909] 03/08/2011  . Back pain, chronic [M54.9, G89.29] 03/08/2011  . Chronic obstructive pulmonary disease ([place]) [J44.9] 03/08/2011  . Acid reflux [K21.9] 03/08/2011  . Cystitis [N30.90] 01/22/2011  . Bilateral hand numbness [R20.0] 01/13/2011  . WEIGHT LOSS [R63.4] 06/24/2010  . RIB PAIN, LEFT SIDED [R07.9] 05/04/2010  . ABDOMINAL PAIN, LEFT UPPER QUADRANT [R10.12] 05/04/2010  . Unspecified otitis media [H66.90] 10/26/2009  . BRONCHITIS, RECURRENT [J42] 08/23/2009  . URI, ACUTE [J06.9] 08/04/2009  . FATIGUE [R53.81, R53.83] 07/06/2009  . IBS [K58.9] 03/11/2009   . ANAL FISSURE [K60.2] 03/11/2009  . RECTAL BLEEDING [K62.5] 03/11/2009  . Chronic interstitial cystitis [N30.10] 03/11/2009  . COLONIC POLYPS, HX OF [Z86.010] 03/11/2009  . GERD [K21.9] 02/05/2009  . SMOKER [F17.200] 12/04/2008  . chronic asthma poorly controlled [J45.20] 12/04/2008  . ADENOMATOUS COLONIC POLYP [D12.6] 08/31/2007  . 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 Bipolar disorder with depression ([place]) [F31.30] 08/31/2007  . HYPERTENSION [I10] 08/31/2007  . EMPHYSEMA [J43.8] 08/31/2007  . NEPHROLITHIASIS [N20.0] 08/31/2007  . ARTHRITIS [M12.9] 08/31/2007  . FIBROMYALGIA [IMO0001] 08/31/2007  . SLEEP APNEA [G47.30] 08/31/2007   Subjective Data:   [name] is a 42 y/o F with history of Bipolar I who was admitted voluntarily after meeting with her outpatient provider at [facility] and voicing worsening depression, anxiety, racing thoughts, and hopelessness.  Upon initial evaluation, pt shares her reasons for coming to the hospital, stating, "During the day, I haven't been taking anything and my depression is getting worse - I'm nervous all the time; I'm not suicidal or anything, but I can't keep up with feeling anxious and depressed during the day like this." She denies SI/HI/AH/VH. She notes that her anxiety during the daytime is her primary concern, and her mood and anxiety typically are not problematic in the evening. She sleeps well with assistance of using Belsomra. She reports some guilty feelings, low energy, poor concentration, and mild psychomotor retardation. She denies other symptoms of depression, mania, OCD, and PTSD. She denies illicit substance use, but she is prescribed Tylenol #4 for cystitis which she takes up to three times per day.  Discussed with patient about treatment options. She expresses that she would like to discharge as soon as possible (she has already signed  72-hour release request) as she did not understand that she would be admitted to the  hospital upon her initial presentation. Pt shares that she feels her medications are currently helping and she takes them as directed. She would like to resume home medications of remeron, prozac, seroquel, and risperdal. We discussed consolidating seroquel  into single dose at bedtime to reduce any daytime fatigue, and pt was in agreement. We also discussed starting trial of gabapentin to address anxiety and pt was in agreement. Pt requests to discharge to outpatient level of care as soon as possible, but she agrees to remain in the hospital tonight to observe how she tolerates these medication changes. She had no further questions, comments, or concerns.   Continued Clinical Symptoms:  Alcohol Use Disorder Identification Test Final Score (AUDIT): 0 The "Alcohol Use Disorders Identification Test", Guidelines for Use in Primary Care, Second Edition.  World Pharmacologist [facility]). Score between 0-7:  no or low risk or alcohol related problems. Score between 8-15:  moderate risk of alcohol related problems. Score between 16-19:  high risk of alcohol related problems. Score 20 or above:  warrants further diagnostic evaluation for alcohol dependence and treatment.   CLINICAL FACTORS:   Severe Anxiety and/or Agitation Bipolar Disorder:   Depressive phase Chronic Pain More than one psychiatric diagnosis Medical Diagnoses and Treatments/Surgeries   Musculoskeletal: Strength & Muscle Tone: within normal limits Gait & Station: normal Patient leans: N/A  Psychiatric Specialty Exam: Physical Exam  Nursing note and vitals reviewed.   Review of Systems  Constitutional: Negative for chills and fever.  Respiratory: Negative for cough and shortness of breath.   Cardiovascular: Negative for chest pain.  Gastrointestinal: Negative for abdominal pain, heartburn, nausea and vomiting.  Psychiatric/Behavioral: Positive for depression. Negative for hallucinations, substance abuse and suicidal ideas.  The patient is nervous/anxious.     Blood pressure 114/77, pulse 88, temperature 97.7 F (36.5 C), temperature source Oral, resp. rate 16, height 5\' 4"  (1.626 m), weight 48.1 kg (106 lb), SpO2 100 %.Body mass index is 18.19 kg/m.  General Appearance: Casual and Fairly Groomed  Eye Contact:  Good  Speech:  Clear and Coherent and Normal Rate  Volume:  Normal  Mood:  Anxious and Depressed  Affect:  Congruent and Constricted  Thought Process:  Coherent and Goal Directed  Orientation:  Full (Time, Place, and Person)  Thought Content:  Logical  Suicidal Thoughts:  No  Homicidal Thoughts:  No  Memory:  Immediate;   Fair Recent;   Fair Remote;   Fair  Judgement:  Fair  Insight:  Lacking  Psychomotor Activity:  Normal  Concentration:  Concentration: Fair  Recall:  [employer] of Knowledge:  Fair  Language:  Fair  Akathisia:  No  Handed:    AIMS (if indicated):     Assets:  Communication Skills Resilience Social Support Talents/Skills  ADL's:  Intact  Cognition:  WNL  Sleep:  Number of Hours: 6      COGNITIVE FEATURES THAT CONTRIBUTE TO RISK:  None    SUICIDE RISK:   Minimal: No identifiable suicidal ideation.  Patients presenting with no risk factors but with morbid ruminations; may be classified as minimal risk based on the severity of the depressive symptoms  PLAN OF CARE:   - Admit to inpatient level of care  - Bipolar I  - Restart risperdal 2mg  po qhs  - Change seroquel 100mg  BID to seroquel 200mg  qhs  - Restart remeron 30mg  po qhs  -  Restart prozac 20mg  po qDay  - Anxiety   - Start vistaril 50mg  po q6h prn anxiety  - Restart Klonopin 0.5mg  qAM  - Start gabapentin 300mg  po TID  - Insomnia  - Start trazodone 50mg  po qhs prn insomnia  - Other home medications  - Restart other home medications as pt was taking them including albuterol, protonix  - Encourage participation in groups and the therapeutic milieu  -Discharge planning will be ongoing   I  certify that inpatient services furnished can reasonably be expected to improve the patient's condition.   Pennelope Bracken, MD 04/24/2017, 4:17 PM

## 2017-04-24 NOTE — Progress Notes (Signed)
Recreation Therapy Notes  Animal-Assisted Activity (AAA) Program Checklist/Progress Notes Patient Eligibility Criteria Checklist & Daily Group note for Rec TxIntervention  Date: 01.15.2019 Time: 2:45pm Location: [address]   AAA/T Program Assumption of Risk Form signed by Patient/ or Parent Legal Guardian Yes  Patient is free of allergies or sever asthma Yes  Patient reports no fear of animals Yes  Patient reports no history of cruelty to animals Yes  Patient understands his/her participation is voluntary Yes  Patient washes hands before animal contact Yes  Patient washes hands after animal contact Yes  Behavioral Response: Appropriate   Education:Hand Washing, Appropriate Animal Interaction   Education Outcome: Acknowledges education.   Clinical Observations/Feedback: Patient attended session and interacted appropriately with therapy dog and peers.   Laureen Ochs Jaclyn Andy, LRT/CTRS          Jamisen Hawes L 04/24/2017 3:01 PM

## 2017-04-24 NOTE — Progress Notes (Signed)
D  Pt is pleasant on approach and cooperative   She interacts well with others and is compliant with treatment    She reports some improvement since coming in yesterday in her anxiety but continues to request extra medications for anxiety  A    Verbal support given   Medications administered and effectiveness monitored    Q 15 min checks    Discussed relaxation exercises with patient  R   Pt is safe at present time

## 2017-04-24 NOTE — H&P (Signed)
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 inpatient level of care  - Bipolar I             - Restart risperdal 50m po qhs             - Change seroquel 1080mBID to seroquel 20072mhs             - Restart remeron 92m79m qhs             - Restart prozac 20mg49mqDay  - Anxiety              - Start vistaril 50mg 15m6h prn anxiety             - Restart Klonopin 0.5mg qA71m           - Start gabapentin 300mg po83m  - Insomnia             - Start trazodone 50mg po 73mprn insomnia  - Other home medications             - Restart other home medications as pt was taking them including albuterol, protonix  - Encourage participation in groups and the therapeutic milieu  -Discharge planning will be ongoing    ChristophMaris Berger
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
## 2017-04-24 NOTE — BHH Suicide Risk Assessment (Signed)
[place] INPATIENT:  Family/Significant Other Suicide Prevention Education  Suicide Prevention Education:  Patient Refusal for Family/Significant Other Suicide Prevention Education: The patient [name] has refused to provide written consent for family/significant other to be provided Family/Significant Other Suicide Prevention Education during admission and/or prior to discharge.  Physician notified.  SPE completed with pt, as pt refused to consent to family contact. SPI pamphlet provided to pt and pt was encouraged to share information with support network, ask questions, and talk about any concerns relating to SPE. Pt denies access to guns/firearms and verbalized understanding of information provided. Mobile Crisis information also provided to pt.   Jasen Hartstein N Smart LCSW 04/24/2017, 2:40 PM

## 2017-04-24 NOTE — BHH Group Notes (Signed)
[facility] Mental Health Association Group Therapy 04/24/2017 1:15pm  Type of Therapy: Mental Health Association Presentation  Participation Level: Active  Participation Quality: Attentive  Affect: Appropriate  Cognitive: Oriented  Insight: Developing/Improving  Engagement in Therapy: Engaged  Modes of Intervention: Discussion, Education and Socialization  Summary of Progress/Problems: [place] Speaker came to talk about his personal journey with mental health. The pt processed ways by which to relate to the speaker. [place] speaker provided handouts and educational information pertaining to groups and services offered by the [facility]. Pt was engaged in speaker's presentation and was receptive to resources provided.    [employer], LCSW 04/24/2017 2:18 PM

## 2017-04-25 DIAGNOSIS — Z560 Unemployment, unspecified: Secondary | ICD-10-CM

## 2017-04-25 DIAGNOSIS — F1210 Cannabis abuse, uncomplicated: Secondary | ICD-10-CM

## 2017-04-25 LAB — PROLACTIN: Prolactin: 46 ng/mL — ABNORMAL HIGH (ref 4.8–23.3)

## 2017-04-25 MED ORDER — QUETIAPINE FUMARATE 200 MG PO TABS: 200.0000 mg | ORAL_TABLET | Freq: Every day | ORAL | 0 refills | Status: DC

## 2017-04-25 MED ORDER — RISPERIDONE 2 MG PO TABS: 2.0000 mg | ORAL_TABLET | Freq: Every day | ORAL | 0 refills | Status: DC

## 2017-04-25 MED ORDER — OMEPRAZOLE 40 MG PO CPDR: 40.0000 mg | DELAYED_RELEASE_CAPSULE | Freq: Every day | ORAL | Status: DC

## 2017-04-25 MED ORDER — ALBUTEROL SULFATE HFA 108 (90 BASE) MCG/ACT IN AERS: 2.0000 | INHALATION_SPRAY | Freq: Four times a day (QID) | RESPIRATORY_TRACT | 0 refills | Status: DC | PRN

## 2017-04-25 MED ORDER — CLONAZEPAM 0.5 MG PO TABS: 0.5000 mg | ORAL_TABLET | Freq: Every day | ORAL | 0 refills | Status: DC

## 2017-04-25 MED ORDER — NICOTINE 21 MG/24HR TD PT24: 21.0000 mg | MEDICATED_PATCH | Freq: Every day | TRANSDERMAL | 0 refills | Status: DC

## 2017-04-25 MED ORDER — MIRTAZAPINE 30 MG PO TBDP: 30.0000 mg | ORAL_TABLET | Freq: Every day | ORAL | 0 refills | Status: DC

## 2017-04-25 MED ORDER — GABAPENTIN 300 MG PO CAPS: 300.0000 mg | ORAL_CAPSULE | Freq: Three times a day (TID) | ORAL | 0 refills | Status: DC

## 2017-04-25 MED ORDER — FLUOXETINE HCL 10 MG PO CAPS: 30.0000 mg | ORAL_CAPSULE | Freq: Every day | ORAL | 0 refills | Status: DC

## 2017-04-25 MED ORDER — MIRTAZAPINE 15 MG PO TABS: 15.0000 mg | ORAL_TABLET | Freq: Every day | ORAL | 0 refills | Status: DC

## 2017-04-25 MED ORDER — HYDROXYZINE HCL 50 MG PO TABS: 50.0000 mg | ORAL_TABLET | Freq: Four times a day (QID) | ORAL | 0 refills | Status: DC | PRN

## 2017-04-25 MED ORDER — TRAZODONE HCL 50 MG PO TABS: 50.0000 mg | ORAL_TABLET | Freq: Every day | ORAL | 0 refills | Status: DC

## 2017-04-25 NOTE — Progress Notes (Signed)
  [facility] Adult Case Management Discharge Plan :  Will you be returning to the same living situation after discharge:  Yes,  home At discharge, do you have transportation home?: Yes,  family member after 1pm Do you have the ability to pay for your medications: Yes,  BCBS insurance  Release of information consent forms completed and submitted to medical records by CSW.  Patient to Follow up at: [place] Follow up on 04/26/2017.   Why:  Hospital follow-up at 10:00AM with Mateo Flow on Thursday, 04/26/17. Thank you.  Contact information: [address] [place] [address] [phone]           Next level of care provider has access to [place] and Suicide Prevention discussed: Yes,  SPE completed with pt; pt declined to consent to family contact. SPI pamphlet and Mobile Crisis information provided to pt.   Have you used any form of tobacco in the last 30 days? (Cigarettes, Smokeless Tobacco, Cigars, and/or Pipes): Yes  Has patient been referred to the Quitline?: Patient refused referral  Patient has been referred for addiction treatment: Yes  [employer], LCSW 04/25/2017, 9:05 AM

## 2017-04-25 NOTE — Progress Notes (Signed)
Recreation Therapy Notes  Date: 04/25/17 Time: 0930 Location: 300 Hall Dayroom  Group Topic: Stress Management  Goal Area(s) Addresses:  Patient will verbalize importance of using healthy stress management.  Patient will identify positive emotions associated with healthy stress management.   Intervention: Stress Management  Activity :  Meditation.  LRT introduced the stress management group of meditation.  LRT played a meditation from the Calm app to participate in the meditation.  Patients were to listen and follow along with the meditation in order to engage in the activity.  Education:  Stress Management, Discharge Planning.   Education Outcome: Acknowledges edcuation/In group clarification offered/Needs additional education  Clinical Observations/Feedback: Pt did not attend group.    Victorino Sparrow, LRT/CTRS         Victorino Sparrow A 04/25/2017 12:39 PM

## 2017-04-25 NOTE — BHH Suicide Risk Assessment (Signed)
[facility] Discharge Suicide Risk Assessment   Principal Problem: Bipolar disorder with depression [facility]) Discharge Diagnoses:  Patient Active Problem List   Diagnosis Date Noted  . MDD (major depressive disorder) [F32.9] 04/23/2017  . Substance abuse ([place]) [F19.10] 04/19/2017  . Hyperthyroidism [E05.90] 02/17/2015  . Anxiety [F41.9] 01/28/2015  . Clinical depression [F32.9] 01/28/2015  . COPD exacerbation ([place]) [J44.1] 11/26/2013  . Vaginitis and vulvovaginitis [N76.0] 11/26/2013  . Recurrent pneumonia [J18.9] 02/02/2013  . Left arm pain [M79.602] 01/13/2013  . Left arm pain [M79.602] 01/13/2013    Class: Acute  . Dizziness and giddiness [R42] 01/13/2013  . Acute bronchitis [J20.9] 12/07/2012  . Rib pain [R07.81] 12/07/2012  . Insomnia [G47.00] 08/26/2012  . Diarrhea [R19.7] 06/19/2012  . Heme positive stool [R19.5] 06/19/2012  . Bloating [R14.0] 06/19/2012  . Nausea alone [R11.0] 06/19/2012  . Bladder retention [R33.9] 04/24/2012  . Tremor [R25.1] 03/05/2012  . Dysuria [R30.0] 03/05/2012  . Failure to thrive in adult [R62.7] 06/21/2011  . Airway hyperreactivity [J45.909] 03/08/2011  . Back pain, chronic [M54.9, G89.29] 03/08/2011  . Chronic obstructive pulmonary disease ([place]) [J44.9] 03/08/2011  . Acid reflux [K21.9] 03/08/2011  . Cystitis [N30.90] 01/22/2011  . Bilateral hand numbness [R20.0] 01/13/2011  . WEIGHT LOSS [R63.4] 06/24/2010  . RIB PAIN, LEFT SIDED [R07.9] 05/04/2010  . ABDOMINAL PAIN, LEFT UPPER QUADRANT [R10.12] 05/04/2010  . Unspecified otitis media [H66.90] 10/26/2009  . BRONCHITIS, RECURRENT [J42] 08/23/2009  . URI, ACUTE [J06.9] 08/04/2009  . FATIGUE [R53.81, R53.83] 07/06/2009  . IBS [K58.9] 03/11/2009  . ANAL FISSURE [K60.2] 03/11/2009  . RECTAL BLEEDING [K62.5] 03/11/2009  . Chronic interstitial cystitis [N30.10] 03/11/2009  . COLONIC POLYPS, HX OF [Z86.010] 03/11/2009  . GERD [K21.9] 02/05/2009  . SMOKER [F17.200] 12/04/2008  . chronic asthma poorly  controlled [J45.20] 12/04/2008  . ADENOMATOUS COLONIC POLYP [D12.6] 08/31/2007  . 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 Bipolar disorder with depression ([place]) [F31.30] 08/31/2007  . HYPERTENSION [I10] 08/31/2007  . EMPHYSEMA [J43.8] 08/31/2007  . NEPHROLITHIASIS [N20.0] 08/31/2007  . ARTHRITIS [M12.9] 08/31/2007  . FIBROMYALGIA [IMO0001] 08/31/2007  . SLEEP APNEA [G47.30] 08/31/2007    Total Time spent with patient: 30 minutes  Musculoskeletal: Strength & Muscle Tone: within normal limits Gait & Station: normal Patient leans: N/A  Psychiatric Specialty Exam: Review of Systems  Constitutional: Negative for chills and fever.  Respiratory: Negative for cough and shortness of breath.   Cardiovascular: Negative for chest pain.  Gastrointestinal: Negative for abdominal pain, heartburn, nausea and vomiting.  Psychiatric/Behavioral: Negative for depression, hallucinations and suicidal ideas. The patient is not nervous/anxious.     Blood pressure 130/63, pulse (!) 143, temperature 97.9 F (36.6 C), temperature source Oral, resp. rate 16, height 5\' 4"  (1.626 m), weight 48.1 kg (106 lb), SpO2 100 %.Body mass index is 18.19 kg/m.  General Appearance: Casual and Fairly Groomed  Engineer, water::  Good  Speech:  Clear and Coherent and Normal Rate  Volume:  Normal  Mood:  Euthymic  Affect:  Appropriate and Congruent  Thought Process:  Coherent and Goal Directed  Orientation:  Full (Time, Place, and Person)  Thought Content:  Logical  Suicidal Thoughts:  No  Homicidal Thoughts:  No  Memory:  Immediate;   Fair Recent;   Fair Remote;   Fair  Judgement:  Fair  Insight:  Fair  Psychomotor Activity:  Normal  Concentration:  Fair  Recall:  [employer] of Knowledge:Good  Language: Fair  Akathisia:  None  Handed:    AIMS (if  indicated):     Assets:  Armed forces logistics/support/administrative officer Physical Health Resilience Social Support  Sleep:  Number of Hours: 6.75  Cognition: WNL  ADL's:  Intact    Mental Status Per Nursing Assessment::   On Admission:  NA  Demographic Factors:  Low socioeconomic status  Loss Factors: NA  Historical Factors: Impulsivity  Risk Reduction Factors:   Positive social support, Positive therapeutic relationship and Positive coping skills or problem solving skills  Continued Clinical Symptoms:  Severe Anxiety and/or Agitation Depression:   Severe  Cognitive Features That Contribute To Risk:  None    Suicide Risk:  Minimal: No identifiable suicidal ideation.  Patients presenting with no risk factors but with morbid ruminations; may be classified as minimal risk based on the severity of the depressive symptoms  [place] Follow up on 04/26/2017.   Why:  Hospital follow-up at 10:00AM with Mateo Flow on Thursday, 04/26/17. Thank you.  Contact information: [address] [place] [address] [phone]         Subjective Data:  [name] is a 42 y/o F with history of Bipolar I who was admitted voluntarily after meeting with her outpatient provider at [facility] and voicing worsening depression, anxiety, racing thoughts, and hopelessness. Upon initial evaluation, pt reported she was concerned about daytime anxiety but she denied SI/HI/AH/VH. She was restarted on home medications with addition of vistaril PRN and scheduled gabapentin for anxiety.  Today upon evaluation, pt reports she is feeling "better." She shares, "I only feel a little bit anxious today; I've been thinking about things I need to change so I don't be stressed out at home, and part of that is being more open with my family about how I'm feeling." Pt shares that she plans to work on coping strategies for anxiety with her therapist. She reports that she slept well last night. She is tolerating her medications without difficulty or side effects. She would like to continue on her current regimen without changes. Pt requests to discharge to outpatient level  of care. She was able to engage in safety planning including plan to return to [facility] or contact emergency services if she feels unable to maintain her own safety or the safety of others. Pt had no further questions, comments, or concerns.   Plan Of Care/Follow-up recommendations:   - Discharge to outpatient level of care  - Bipolar I             - Continue risperdal 2mg  po qhs             - Continue seroquel 200mg  qhs             - Continue remeron 30mg  po qhs             - Continue prozac 20mg  po qDay  - Anxiety              -Continue vistaril 50mg  po q6h prn anxiety             - Continue Klonopin 0.5mg  qAM             - Continue gabapentin 300mg  po TID  - Insomnia             - Continue trazodone 50mg  po qhs prn insomnia  - Other home medications             - Continue other home medications as previously directed  Activity:  as tolerated Diet:  normal Tests:  NA Other:  see above for [place], MD 04/25/2017, 8:52 AM

## 2017-04-25 NOTE — Progress Notes (Signed)
Pt discharged home with her mother. Pt was ambulatory, stable and appreciative at that time. All papers and prescriptions were given and valuables returned. Verbal understanding expressed. Denies SI/HI and A/VH. Pt given opportunity to express concerns and ask questions.

## 2017-04-25 NOTE — Discharge Summary (Signed)
Physician Discharge Summary Note  Patient:  [name] is an 42 y.o., female  MRN:  [id number]  DOB:  Mar 31, 1976  Patient phone:  [phone] (home)   Patient address:   [address] [place] [address],   Total Time spent with patient: Greater than 30 minutes  Date of Admission:  04/23/2017 Date of Discharge: 04-25-17  Reason for Admission: Worsening symptoms of depression, anxiety with racing thoughts.   Principal Problem: Bipolar disorder with depression [facility])  Discharge Diagnoses: Patient Active Problem List   Diagnosis Date Noted  . Bipolar disorder with depression ([place]) [F31.30] 08/31/2007    Priority: High  . MDD (major depressive disorder) [F32.9] 04/23/2017  . Substance abuse ([place]) [F19.10] 04/19/2017  . Hyperthyroidism [E05.90] 02/17/2015  . Anxiety [F41.9] 01/28/2015  . Clinical depression [F32.9] 01/28/2015  . COPD exacerbation ([place]) [J44.1] 11/26/2013  . Vaginitis and vulvovaginitis [N76.0] 11/26/2013  . Recurrent pneumonia [J18.9] 02/02/2013  . Left arm pain [M79.602] 01/13/2013  . Left arm pain [M79.602] 01/13/2013    Class: Acute  . Dizziness and giddiness [R42] 01/13/2013  . Acute bronchitis [J20.9] 12/07/2012  . Rib pain [R07.81] 12/07/2012  . Insomnia [G47.00] 08/26/2012  . Diarrhea [R19.7] 06/19/2012  . Heme positive stool [R19.5] 06/19/2012  . Bloating [R14.0] 06/19/2012  . Nausea alone [R11.0] 06/19/2012  . Bladder retention [R33.9] 04/24/2012  . Tremor [R25.1] 03/05/2012  . Dysuria [R30.0] 03/05/2012  . Failure to thrive in adult [R62.7] 06/21/2011  . Airway hyperreactivity [J45.909] 03/08/2011  . Back pain, chronic [M54.9, G89.29] 03/08/2011  . Chronic obstructive pulmonary disease ([place]) [J44.9] 03/08/2011  . Acid reflux [K21.9] 03/08/2011  . Cystitis [N30.90] 01/22/2011  . Bilateral hand numbness [R20.0] 01/13/2011  . WEIGHT LOSS [R63.4] 06/24/2010  . RIB PAIN, LEFT SIDED [R07.9] 05/04/2010  . ABDOMINAL PAIN, LEFT UPPER QUADRANT  [R10.12] 05/04/2010  . Unspecified otitis media [H66.90] 10/26/2009  . BRONCHITIS, RECURRENT [J42] 08/23/2009  . URI, ACUTE [J06.9] 08/04/2009  . FATIGUE [R53.81, R53.83] 07/06/2009  . IBS [K58.9] 03/11/2009  . ANAL FISSURE [K60.2] 03/11/2009  . RECTAL BLEEDING [K62.5] 03/11/2009  . Chronic interstitial cystitis [N30.10] 03/11/2009  . COLONIC POLYPS, HX OF [Z86.010] 03/11/2009  . GERD [K21.9] 02/05/2009  . SMOKER [F17.200] 12/04/2008  . chronic asthma poorly controlled [J45.20] 12/04/2008  . ADENOMATOUS COLONIC POLYP [D12.6] 08/31/2007  . 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 HYPERTENSION [I10] 08/31/2007  . EMPHYSEMA [J43.8] 08/31/2007  . NEPHROLITHIASIS [N20.0] 08/31/2007  . ARTHRITIS [M12.9] 08/31/2007  . FIBROMYALGIA [IMO0001] 08/31/2007  . SLEEP APNEA [G47.30] 08/31/2007   Past Psychiatric History: See H&P  Past Medical History:  Past Medical History:  Diagnosis Date  . 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 Anal fissure 03/11/2009  . Anemia   . Anxiety   . Anxiety and depression   . ARTHRITIS 08/31/2007  . Arthritis   . Asthma   . 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 Bipolar 1 disorder ([place])   . Bowel obstruction ([place])   . BRONCHITIS, RECURRENT 08/23/2009   Asthmatic Bronchitis-Dr. Melvyn Novas.....-HFA 75% 12/04/2008>75% 02/05/2009>75% 08/04/2009 -PFT's 01/04/2009 2.56 (86%) ratio 75, no resp to B2 and DLC0 67% > 80 after correction   . Cancer (HCC)    cervical cancer  . Chronic interstitial cystitis 03/11/2009  . Chronic nausea   . Chronic pain   . Colon polyps   . COLONIC POLYPS, HX OF 07/25/2006   ADENOMATOUS POLYP  . COPD (chronic obstructive pulmonary disease) ([place])   . DEPRESSION 08/31/2007  . Endometriosis   .  FIBROMYALGIA 08/31/2007  . Fibromyalgia   . GERD 02/05/2009  . Hyperlipidemia   . HYPERTENSION 08/31/2007  . IBS 03/11/2009  . Internal hemorrhoids   . Migraine headache   . 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 PONV (postoperative nausea and  vomiting)   . RECTAL BLEEDING 03/11/2009  . Seizures ([place])    been about 1 year since last seisure per pt  . SLEEP APNEA 08/31/2007  . Thyroid disease   . Uterine cyst     Past Surgical History:  Procedure Laterality Date  . ABDOMINAL HYSTERECTOMY    . bladder stretching x6    . BLADDER SURGERY     stimulator placed and stretching   . CHOLECYSTECTOMY    . COLONOSCOPY    . COLONOSCOPY WITH PROPOFOL N/A 09/03/2014   Procedure: COLONOSCOPY WITH PROPOFOL;  Surgeon: Milus Banister, MD;  Location: [facility]COPY;  Service: Endoscopy;  Laterality: N/A;  . interstitial cystitis    . PACEMAKER INSERTION     in hip for interstitial cystitis  . removal of uterine cyst and scrapped uterus    . replaced bladder pacemaker     Family History:  Family History  Problem Relation Age of Onset  . Heart disease Father   . Asthma Maternal Grandmother   . Emphysema Maternal Grandfather   . Cancer Maternal Grandfather        Lung Cancer  . Cancer Other        Lung Cancer-Aunt  . Colon cancer Neg Hx   . Esophageal cancer Neg Hx   . Rectal cancer Neg Hx   . Stomach cancer Neg Hx   . Thyroid disease Neg Hx    Family Psychiatric  History: See H&P Social History:  Social History   Substance and Sexual Activity  Alcohol Use Yes  . Alcohol/week: 0.0 oz   Comment: rare wine      Social History   Substance and Sexual Activity  Drug Use Yes  . Types: Marijuana   Comment: once a month    Social History   Socioeconomic History  . Marital status: Married    Spouse name: None  . Number of children: 2  . Years of education: None  . Highest education level: None  Social Needs  . Financial resource strain: None  . Food insecurity - worry: None  . Food insecurity - inability: None  . Transportation needs - medical: None  . Transportation needs - non-medical: None  Occupational History    Employer: UNEMPLOYED  Tobacco Use  . Smoking status: Current Every Day Smoker    Packs/day: 1.00     Years: 14.00    Pack years: 14.00    Types: Cigarettes  . Smokeless tobacco: Never Used  . Tobacco comment: trying to quit  Substance and Sexual Activity  . Alcohol use: Yes    Alcohol/week: 0.0 oz    Comment: rare wine   . Drug use: Yes    Types: Marijuana    Comment: once a month  . Sexual activity: None  Other Topics Concern  . None  Social History Narrative   Homemaker   Daily Caffeine Use-Mtn. [facility] Course: (Per admission assessment): This is an admission assessment for this 42 year old Caucasian female with hx of Bipolar disorder. Admitted to the [facility] adult unit as a walk-in with complaints of worsening symptoms of depression, anxiety, anger issues, irritability & self isolation. She has been receiving mental health care &  counseling services at the [facility] in [place].  During this assessment, [name] reports, "I had come to this hospital yesterday from my therapy appointment. I have been receiving counseling sessions for over 10 years because I'm bipolar. It was diagnosed 5 years ago, but I know, I have had it for over 10 years. I'm on medications. The medications work well at night time, but, there is nothing for me to take during the daytime. Then, I'm in trouble once the day begins everyday. My mind will race, I will feel hopeless, sad, no motivation. During my therapy sessions yesterday, I told my therapist how I have been feeling. I told her that I have been struggling during the day. I don't know what to do. I have not been able to cope for 2 weeks now. Nothing triggered these symptoms. I'm not suicidal or anything of that nature. But, I have bad anxiety, can't sit still. I have nothing to do throughout the day. I'm unable to hold a job because of my ill health. All I do is, I sit around the house over thinking & feeling overwhelmed. I take Seroquel 100 mg twice daily & Risperdal 1 mg at bedtime. I only have Klonopin 0.5 mg once daily. I  have bad cystitis, my Neurologist prescribed me Tylenol #4. I have bunch of allergies".   [name]'s stay in this hospital was rather very brief. She was admitted to the hospital with complaints of worsening symptoms of depression, anxiety with racing thoughts. However, she denied suicidal thoughts up front. She stated on admission that her mental health medications that she was taking at nighttime were effective in managing her symptoms, however, has been struggling during the daytime as it is when her anxiety level excalates & the racing thoughts worsens. She was in need of mood stabilization treatments.  After her admission assessment, [name] was re-started on her  pertinent psychiatric medications with some adjustments & addition of other medications made to meet her mental health care needs. Her current medications include; Fluoxetine 30 mg for depression, Clonazepam 0.5 mg for severe anxiety, Gabapentin 300 mg for agitation, Hydroxyzine 50 mg prn for anxiety, Mirtazapine 30 mg for depression, Nicotine patch 21 mg for smoking cessation, Seroquel 200 mg for mood control, Risperdal 2 mg for mood control & Trazodone 50 mg for insomnia. [name] presented other significant pre-existing health issues that required treatment. She was resumed on all her pertinent home medications for those health issues. She was also enrolled in the group counseling sessions being offered & held on this unit to learn coping skills. [name] is being discharged on 2 separate antipsychotic medications as she presented on admission. She remain on these medications as recommended. However, her outpatient psychiatric provider will continue to treat & monitor [name]'s progress as to when to stop one of these medications, if at all will based on how well she is doing.  During her follow-up care assessment this morning, [name] presented with a good affect, good eye contact, is alert & oriented x 3. She is aware of situation & able to make concrete  decisions/requests. She denies any new issues. She has asked to be discharged today as she stated feeling a lot better. She is currently mentally & medically stable. And because there is no clinical criteria to keep [name] admitted to the hospital, she is being discharged as requested to her place of residence. She is committed to keeping her follow-up appointments with her primary psychiatrist & taking her medications  as recommended.   Upon discharge, [name] appears much more in control of her mood & behavior. Her symptoms were reported as significantly improved. There are currently, no active SI plans or intent, AVH, delusional thoughts or paranoia. She is going to pursue outpatient treatment in her own terms with her outpatient provider as noted below. She was provided with prescriptions for all her mental medications. She left [facility] with all personal belongings in no apparent distress. Transportation per her family.  Physical Findings: AIMS: Facial and Oral Movements Muscles of Facial Expression: None, normal Lips and Perioral Area: None, normal Jaw: None, normal Tongue: None, normal,Extremity Movements Upper (arms, wrists, hands, fingers): None, normal Lower (legs, knees, ankles, toes): None, normal, Trunk Movements Neck, shoulders, hips: None, normal, Overall Severity Severity of abnormal movements (highest score from questions above): None, normal Incapacitation due to abnormal movements: None, normal Patient's awareness of abnormal movements (rate only patient's report): No Awareness, Dental Status Current problems with teeth and/or dentures?: No Does patient usually wear dentures?: No  CIWA:    COWS:  COWS Total Score: 6  Musculoskeletal: Strength & Muscle Tone: within normal limits Gait & Station: normal Patient leans: N/A  Psychiatric Specialty Exam: Physical Exam  Nursing note and vitals reviewed. Constitutional: She appears well-developed.  Eyes: Pupils are equal, round, and  reactive to light.  Neck: Normal range of motion.  Cardiovascular: Normal rate.  Respiratory: Effort normal.  GI: Soft.  Genitourinary:  Genitourinary Comments: Deferred  Musculoskeletal: Normal range of motion.  Neurological: She is alert.  Skin: Skin is warm.    Review of Systems  Constitutional: Negative.   HENT: Negative.   Eyes: Negative.   Respiratory: Negative.   Cardiovascular: Negative.   Gastrointestinal: Negative.   Genitourinary: Negative.   Skin: Negative.   Neurological: Negative.   Endo/Heme/Allergies: Negative.   Psychiatric/Behavioral: Positive for depression (Stable) and substance abuse (Hx. Opioid & Benzodiazpine use). Negative for hallucinations, memory loss and suicidal ideas. The patient has insomnia (Stable). The patient is not nervous/anxious.     Blood pressure 130/63, pulse (!) 143, temperature 97.9 F (36.6 C), temperature source Oral, resp. rate 16, height 5\' 4"  (1.626 m), weight 48.1 kg (106 lb), SpO2 100 %.Body mass index is 18.19 kg/m.  See Md's SRA   Have you used any form of tobacco in the last 30 days? (Cigarettes, Smokeless Tobacco, Cigars, and/or Pipes): Yes  Has this patient used any form of tobacco in the last 30 days? (Cigarettes, Smokeless Tobacco, Cigars, and/or Pipes):Yes, an FDA-approved tobacco cessation medication was offered at discharge.  Blood Alcohol level:  Lab Results  Component Value Date   [facility] <5 05/12/2014   ETH <11 40/01/2724   Metabolic Disorder Labs:  Lab Results  Component Value Date   HGBA1C 5.0 04/24/2017   MPG 96.8 04/24/2017   Lab Results  Component Value Date   PROLACTIN 46.0 (H) 04/24/2017   Lab Results  Component Value Date   CHOL 143 04/24/2017   TRIG 85 04/24/2017   HDL 49 04/24/2017   CHOLHDL 2.9 04/24/2017   VLDL 17 04/24/2017   LDLCALC 77 04/24/2017   LDLCALC 58 07/19/2009   See Psychiatric Specialty Exam and Suicide Risk Assessment completed by Attending Physician prior to  discharge.  Discharge destination:  Home  Is patient on multiple antipsychotic therapies at discharge: Yes, (Seroquel & Risperdal).   Do you recommend tapering to monotherapy for antipsychotics?  Not at this time because she was on both medications upon her admission, however,  her outpatient psychiatric provider will make that judgement call when necessary.    Has Patient had three or more failed trials of antipsychotic monotherapy by history:  No,   Recommended Plan for Multiple Antipsychotic Therapies: Per outpatient psychiatric provider. Additional reason(s) for multiple antispychotic treatment:  Probably due to worsening symptoms.  Allergies as of 04/25/2017      Reactions   Abilify [aripiprazole] Swelling, Other (See Comments), Palpitations   tremors Throat swelling, tremors   Metoclopramide Hcl Other (See Comments)   Causes seizures   Propoxyphene Rash   Tramadol Swelling, Other (See Comments), Rash   Throat swelling, tremors   Ambien [zolpidem Tartrate] Other (See Comments)   hallucinations   Eszopiclone Other (See Comments)   Hallucinations, hyper, bad taste in mouth   Amitriptyline    Other reaction(s): Angioedema (ALLERGY/intolerance)   Metoclopramide    Other reaction(s): Other (See Comments) Seizures   Other Swelling   Acetaminophen #3 Acetaminophen #3   Varenicline Other (See Comments)   Suicidal thoughts   Buprenorphine Hcl Itching, Hives   Demerol [meperidine] Rash   Emetrol Itching, Rash, Hives   Other reaction(s): Rash (ALLERGY/intolerance)   Morphine And Related Hives, Itching   Propoxyphene N-acetaminophen Hives, Itching, Rash   Other reaction(s): Rash (ALLERGY/intolerance)      Medication List    STOP taking these medications   acetaminophen-codeine 300-60 MG tablet Commonly known as:  TYLENOL #4   BELSOMRA 15 MG Tabs Generic drug:  Suvorexant   cyclobenzaprine 10 MG tablet Commonly known as:  FLEXERIL   methocarbamol 500 MG  tablet Commonly known as:  ROBAXIN   scopolamine 1 MG/3DAYS Commonly known as:  TRANSDERM-SCOP (1.5 MG)   SPIRIVA RESPIMAT 2.5 MCG/ACT Aers Generic drug:  Tiotropium Bromide Monohydrate     TAKE these medications     Indication  albuterol 108 (90 Base) MCG/ACT inhaler Commonly known as:  PROVENTIL HFA;VENTOLIN HFA Inhale 2 puffs into the lungs every 6 (six) hours as needed for wheezing or shortness of breath.  Indication:  Asthma   clonazePAM 0.5 MG tablet Commonly known as:  KLONOPIN Take 1 tablet (0.5 mg total) by mouth daily. For anxiety Start taking on:  04/26/2017 What changed:    medication strength  how much to take  when to take this  reasons to take this  additional instructions  Indication:  Anxiety   FLUoxetine 10 MG capsule Commonly known as:  PROZAC Take 3 capsules (30 mg total) by mouth at bedtime. For mood control What changed:    how much to take  when to take this  additional instructions  Indication:  Major Depressive Disorder   gabapentin 300 MG capsule Commonly known as:  NEURONTIN Take 1 capsule (300 mg total) by mouth 3 (three) times daily. For agitation  Indication:  Agitation   hydrOXYzine 50 MG tablet Commonly known as:  ATARAX/VISTARIL Take 1 tablet (50 mg total) by mouth every 6 (six) hours as needed for anxiety.  Indication:  Feeling Anxious   mirtazapine 30 MG disintegrating tablet Commonly known as:  REMERON SOL-TAB Take 1 tablet (30 mg total) by mouth at bedtime. For depression What changed:    medication strength  how much to take  additional instructions  Indication:  Major Depressive Disorder   nicotine 21 mg/24hr patch Commonly known as:  NICODERM CQ - dosed in mg/24 hours Place 1 patch (21 mg total) onto the skin daily. (May buy from OTC): For smoking cessation Start taking on:  04/26/2017  Indication:  Nicotine Addiction   omeprazole 40 MG capsule Commonly known as:  PRILOSEC Take 1 capsule (40 mg total) by  mouth daily. For acid reflux What changed:  additional instructions  Indication:  Gastroesophageal Reflux Disease   QUEtiapine 200 MG tablet Commonly known as:  SEROQUEL Take 1 tablet (200 mg total) by mouth at bedtime. For mood control What changed:    medication strength  how much to take  when to take this  additional instructions  Indication:  Mood control   risperiDONE 2 MG tablet Commonly known as:  RISPERDAL Take 1 tablet (2 mg total) by mouth at bedtime. For mood control What changed:    medication strength  how much to take  additional instructions  Indication:  Mood control   traZODone 50 MG tablet Commonly known as:  DESYREL Take 1 tablet (50 mg total) by mouth at bedtime. For sleep What changed:    how much to take  when to take this  reasons to take this  additional instructions  Indication:  [place], [facility] Follow up on 04/26/2017.   Why:  Hospital follow-up at 10:00AM with Mateo Flow on Thursday, 04/26/17. Thank you.  Contact information: [address] [place] [address] [phone]          Follow-up recommendations: Activity:  As tolerated Diet: As recommended by your primary care doctor. Keep all scheduled follow-up appointments as recommended.  Comments: Patient is instructed prior to discharge to: Take all medications as prescribed by his/her mental healthcare provider. Report any adverse effects and or reactions from the medicines to his/her outpatient provider promptly. Patient has been instructed & cautioned: To not engage in alcohol and or illegal drug use while on prescription medicines. In the event of worsening symptoms, patient is instructed to call the crisis hotline, 911 and or go to the nearest ED for appropriate evaluation and treatment of symptoms. To follow-up with his/her primary care provider for your other medical issues, concerns and or health care needs.    Signed: Lindell Spar, NP, PMHNP, FNP-BC 04/25/2017, 10:27 AM    Patient seen, Suicide Assessment Completed.  Disposition Plan Reviewed   Mirielle Byrum is a 42 y/o F with history of Bipolar I who was admitted voluntarily after meeting with her outpatient provider at [facility] and voicing worsening depression, anxiety, racing thoughts, and hopelessness. Upon initial evaluation, pt reported she was concerned about daytime anxiety but she denied SI/HI/AH/VH. She was restarted on home medications with addition of vistaril PRN and scheduled gabapentin for anxiety.  Today upon evaluation, pt reports she is feeling "better." She shares, "I only feel a little bit anxious today; I've been thinking about things I need to change so I don't be stressed out at home, and part of that is being more open with my family about how I'm feeling." Pt shares that she plans to work on coping strategies for anxiety with her therapist. She reports that she slept well last night. She is tolerating her medications without difficulty or side effects. She would like to continue on her current regimen without changes. Pt requests to discharge to outpatient level of care. She was able to engage in safety planning including plan to return to [facility] or contact emergency services if she feels unable to maintain her own safety or the safety of others. Pt had no further questions, comments, or concerns.  Plan Of Care/Follow-up recommendations:   - Discharge to outpatient level  of care  - Bipolar I - Continue risperdal 2mg  po qhs - Continue seroquel 200mg  qhs - Continue remeron 30mg  po qhs - Continue prozac 20mg  po qDay  - Anxiety -Continue vistaril 50mg  po q6h prn anxiety - Continue Klonopin 0.5mg  qAM - Continue gabapentin 300mg  po TID  - Insomnia - Continue trazodone 50mg  po qhs prn insomnia  - Other home  medications - Continue other home medications as previously directed  Activity:  as tolerated Diet:  normal Tests:  NA Other:  see above for DC plan  Pennelope Bracken, MD

## 2017-04-25 NOTE — [facility] Group Notes (Signed)
Daily Orientation  Date:  04/25/2017  Time:  9:04 AM  Type of Therapy:  Nurse Education  /  He group is focused on orienting patients to the daily unit programming, explaining the unit flow and staff responsibilities and establsihing therapeutic relationship with patients.  Participation Level:  Active  Participation Quality:  Attentive  Affect:  Depressed  Cognitive:  Alert  Insight:  Appropriate  Engagement in Group:  Engaged  Modes of Intervention:  Education  Summary of Progress/Problems:  [name] 04/25/2017, 9:04 AM

## 2017-04-26 DIAGNOSIS — F3181 Bipolar II disorder: Secondary | ICD-10-CM | POA: Diagnosis not present

## 2017-05-03 DIAGNOSIS — F3181 Bipolar II disorder: Secondary | ICD-10-CM | POA: Diagnosis not present

## 2017-05-08 DIAGNOSIS — F3181 Bipolar II disorder: Secondary | ICD-10-CM | POA: Diagnosis not present

## 2017-05-25 DIAGNOSIS — N3010 Interstitial cystitis (chronic) without hematuria: Secondary | ICD-10-CM | POA: Diagnosis not present

## 2017-05-25 DIAGNOSIS — R339 Retention of urine, unspecified: Secondary | ICD-10-CM | POA: Diagnosis not present

## 2017-06-04 DIAGNOSIS — F3181 Bipolar II disorder: Secondary | ICD-10-CM | POA: Diagnosis not present

## 2017-07-02 DIAGNOSIS — J449 Chronic obstructive pulmonary disease, unspecified: Secondary | ICD-10-CM | POA: Diagnosis not present

## 2017-07-02 DIAGNOSIS — F17210 Nicotine dependence, cigarettes, uncomplicated: Secondary | ICD-10-CM | POA: Diagnosis not present

## 2017-07-02 DIAGNOSIS — R350 Frequency of micturition: Secondary | ICD-10-CM | POA: Diagnosis not present

## 2017-07-02 DIAGNOSIS — R3915 Urgency of urination: Secondary | ICD-10-CM | POA: Diagnosis not present

## 2017-07-02 DIAGNOSIS — Z960 Presence of urogenital implants: Secondary | ICD-10-CM | POA: Diagnosis not present

## 2017-07-02 DIAGNOSIS — M7918 Myalgia, other site: Secondary | ICD-10-CM | POA: Diagnosis not present

## 2017-07-02 DIAGNOSIS — R339 Retention of urine, unspecified: Secondary | ICD-10-CM | POA: Diagnosis not present

## 2017-07-02 DIAGNOSIS — N3010 Interstitial cystitis (chronic) without hematuria: Secondary | ICD-10-CM | POA: Diagnosis not present

## 2017-07-02 DIAGNOSIS — K219 Gastro-esophageal reflux disease without esophagitis: Secondary | ICD-10-CM | POA: Diagnosis not present

## 2017-07-19 DIAGNOSIS — F3181 Bipolar II disorder: Secondary | ICD-10-CM | POA: Diagnosis not present

## 2017-07-23 DIAGNOSIS — F3181 Bipolar II disorder: Secondary | ICD-10-CM | POA: Diagnosis not present

## 2017-08-02 ENCOUNTER — Encounter: Payer: Self-pay | Admitting: Family Medicine

## 2017-08-02 ENCOUNTER — Ambulatory Visit: Payer: BLUE CROSS/BLUE SHIELD | Admitting: Family Medicine

## 2017-08-02 VITALS — BP 100/58 | HR 96 | Resp 16 | Ht 64.0 in | Wt 119.4 lb

## 2017-08-02 DIAGNOSIS — R1084 Generalized abdominal pain: Secondary | ICD-10-CM | POA: Diagnosis not present

## 2017-08-02 DIAGNOSIS — R140 Abdominal distension (gaseous): Secondary | ICD-10-CM | POA: Diagnosis not present

## 2017-08-02 LAB — POC URINALSYSI DIPSTICK (AUTOMATED)
Bilirubin, UA: NEGATIVE
Blood, UA: NEGATIVE
Glucose, UA: NEGATIVE
Ketones, UA: NEGATIVE
Leukocytes, UA: NEGATIVE
Nitrite, UA: NEGATIVE
Protein, UA: NEGATIVE
Spec Grav, UA: 1.02 (ref 1.010–1.025)
Urobilinogen, UA: 0.2 E.U./dL
pH, UA: 6 (ref 5.0–8.0)

## 2017-08-02 NOTE — Progress Notes (Signed)
Patient ID: [name], female    DOB: June 23, 1975  Age: 43 y.o. MRN: [id number]    Subjective:  Subjective  HPI [name] presents for abd / pelvic bloating x few months.    It is worse with eating.  She has not been eating but only drinking ---  + nausea, novomiting  No pain Review of Systems  Constitutional: Negative for fever.  HENT: Negative for congestion.   Respiratory: Negative for shortness of breath.   Cardiovascular: Negative for chest pain, palpitations and leg swelling.  Gastrointestinal: Positive for abdominal distention, abdominal pain and nausea. Negative for blood in stool, constipation, diarrhea, rectal pain and vomiting.  Genitourinary: Negative for dysuria and frequency.  Skin: Negative for rash.  Allergic/Immunologic: Negative for environmental allergies.  Neurological: Negative for dizziness and headaches.  Psychiatric/Behavioral: The patient is not nervous/anxious.     History Past Medical History:  Diagnosis Date  . 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 Anal fissure 03/11/2009  . Anemia   . Anxiety   . Anxiety and depression   . ARTHRITIS 08/31/2007  . Arthritis   . Asthma   . 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 Bipolar 1 disorder ([place])   . Bowel obstruction ([place])   . BRONCHITIS, RECURRENT 08/23/2009   Asthmatic Bronchitis-Dr. Melvyn Novas.....-HFA 75% 12/04/2008>75% 02/05/2009>75% 08/04/2009 -PFT's 01/04/2009 2.56 (86%) ratio 75, no resp to B2 and DLC0 67% > 80 after correction   . Cancer (HCC)    cervical cancer  . Chronic interstitial cystitis 03/11/2009  . Chronic nausea   . Chronic pain   . Colon polyps   . COLONIC POLYPS, HX OF 07/25/2006   ADENOMATOUS POLYP  . COPD (chronic obstructive pulmonary disease) ([place])   . DEPRESSION 08/31/2007  . Endometriosis   . FIBROMYALGIA 08/31/2007  . Fibromyalgia   . GERD 02/05/2009  . Hyperlipidemia   . HYPERTENSION 08/31/2007  . IBS 03/11/2009  . Internal hemorrhoids   . Migraine headache   . NEPHROLITHIASIS  08/31/2007  . PONV (postoperative nausea and vomiting)   . RECTAL BLEEDING 03/11/2009  . Seizures ([place])    been about 1 year since last seisure per pt  . SLEEP APNEA 08/31/2007  . Thyroid disease   . Uterine cyst     She has a past surgical history that includes Cholecystectomy; Abdominal hysterectomy; Pacemaker insertion; Bladder surgery; interstitial cystitis; bladder stretching x6; replaced bladder pacemaker; removal of uterine cyst and scrapped uterus; Colonoscopy; and Colonoscopy with propofol (N/A, 09/03/2014).   Her family history includes Asthma in her maternal grandmother; Cancer in her maternal grandfather and other; Emphysema in her maternal grandfather; Heart disease in her father.She reports that she has been smoking cigarettes.  She has a 14.00 pack-year smoking history. She has never used smokeless tobacco. She reports that she drank alcohol. She reports that she has current or past drug history. Drug: Marijuana.  Current Outpatient Medications on File Prior to Visit  Medication Sig Dispense Refill  . albuterol (PROVENTIL HFA;VENTOLIN HFA) 108 (90 Base) MCG/ACT inhaler Inhale 2 puffs into the lungs every 6 (six) hours as needed for wheezing or shortness of breath. 1 Inhaler 0  . clonazePAM (KLONOPIN) 0.5 MG tablet Take 1 tablet (0.5 mg total) by mouth daily. For anxiety 1 tablet 0  . FLUoxetine (PROZAC) 10 MG capsule Take 3 capsules (30 mg total) by mouth at bedtime. For mood control 30 capsule 0  . gabapentin (NEURONTIN) 300 MG capsule Take 1 capsule (300 mg total) by mouth 3 (three)  times daily. For agitation 90 capsule 0  . mirtazapine (REMERON SOL-TAB) 30 MG disintegrating tablet Take 1 tablet (30 mg total) by mouth at bedtime. For depression 30 tablet 0  . nicotine (NICODERM CQ - DOSED IN MG/24 HOURS) 21 mg/24hr patch Place 1 patch (21 mg total) onto the skin daily. (May buy from OTC): For smoking cessation 28 patch 0  . omeprazole (PRILOSEC) 40 MG capsule Take 1 capsule (40 mg  total) by mouth daily. For acid reflux    . QUEtiapine (SEROQUEL) 200 MG tablet Take 1 tablet (200 mg total) by mouth at bedtime. For mood control 30 tablet 0  . risperiDONE (RISPERDAL) 2 MG tablet Take 1 tablet (2 mg total) by mouth at bedtime. For mood control 30 tablet 0  . traZODone (DESYREL) 50 MG tablet Take 1 tablet (50 mg total) by mouth at bedtime. For sleep 30 tablet 0  . hydrOXYzine (ATARAX/VISTARIL) 50 MG tablet Take 1 tablet (50 mg total) by mouth every 6 (six) hours as needed for anxiety. (Patient not taking: Reported on 08/02/2017) 60 tablet 0  . [DISCONTINUED] amitriptyline (ELAVIL) 25 MG tablet 2 tablets by mouth at bedtime     . [DISCONTINUED] clidinium-chlordiazePOXIDE (LIBRAX) 2.5-5 MG per capsule 2 capsules by mouth every morning and 1 at bedtime      No current facility-administered medications on file prior to visit.      Objective:  Objective  Physical Exam  Constitutional: She is oriented to person, place, and time. She appears well-developed and well-nourished.  HENT:  Head: Normocephalic and atraumatic.  Eyes: Conjunctivae and EOM are normal.  Neck: Normal range of motion. Neck supple. No JVD present. Carotid bruit is not present. No thyromegaly present.  Cardiovascular: Normal rate, regular rhythm and normal heart sounds.  No murmur heard. Pulmonary/Chest: Effort normal and breath sounds normal. No respiratory distress. She has no wheezes. She has no rales. She exhibits no tenderness.  Abdominal: She exhibits distension. She exhibits no mass. There is tenderness. There is guarding. There is no rebound.  Musculoskeletal: She exhibits no edema.  Neurological: She is alert and oriented to person, place, and time.  Psychiatric: She has a normal mood and affect.  Nursing note and vitals reviewed.  BP (!) 100/58 (BP Location: Right Arm, Patient Position: Sitting, Cuff Size: Normal)   Pulse 96   Resp 16   Ht 5\' 4"  (1.626 m)   Wt 119 lb 6.4 oz (54.2 kg)   SpO2 98%    BMI 20.49 kg/m  Wt Readings from Last 3 Encounters:  08/02/17 119 lb 6.4 oz (54.2 kg)  04/19/17 106 lb 6.4 oz (48.3 kg)  02/22/17 104 lb 9.6 oz (47.4 kg)     Lab Results  Component Value Date   WBC 7.3 04/24/2017   HGB 13.7 04/24/2017   HCT 40.5 04/24/2017   PLT 369 04/24/2017   GLUCOSE 125 (H) 04/24/2017   CHOL 143 04/24/2017   TRIG 85 04/24/2017   HDL 49 04/24/2017   LDLCALC 77 04/24/2017   ALT 10 (L) 04/24/2017   AST 22 04/24/2017   NA 137 04/24/2017   K 3.3 (L) 04/24/2017   CL 104 04/24/2017   CREATININE 0.73 04/24/2017   BUN 7 04/24/2017   CO2 25 04/24/2017   TSH 2.184 04/24/2017   HGBA1C 5.0 04/24/2017    No results found.   Assessment & Plan:  Plan  I am having Paislyn R. Kolakowski maintain her albuterol, clonazePAM, FLUoxetine, gabapentin, nicotine, omeprazole, QUEtiapine, risperiDONE,  traZODone, mirtazapine, and hydrOXYzine.  No orders of the defined types were placed in this encounter.   Problem List Items Addressed This Visit    None    Visit Diagnoses    Generalized abdominal pain    -  Primary   Relevant Orders   POCT Urinalysis Dipstick (Automated) (Completed)   US Abdomen Complete   US PELVIS (TRANSABDOMINAL ONLY)   CBC with Differential/Platelet   Comprehensive metabolic panel   Amylase   Lipase   Abdominal bloating       Relevant Orders   US Abdomen Complete   US PELVIS (TRANSABDOMINAL ONLY)   CBC with Differential/Platelet   Comprehensive metabolic panel   Amylase   Lipase      Follow-up: Return if symptoms worsen or fail to improve. Go to er if symptoms worsen [name], DO

## 2017-08-02 NOTE — Patient Instructions (Signed)

## 2017-08-03 LAB — COMPREHENSIVE METABOLIC PANEL
ALT: 28 U/L (ref 0–35)
AST: 31 U/L (ref 0–37)
Albumin: 3.9 g/dL (ref 3.5–5.2)
Alkaline Phosphatase: 105 U/L (ref 39–117)
BUN: 8 mg/dL (ref 6–23)
CO2: 29 mEq/L (ref 19–32)
Calcium: 9.3 mg/dL (ref 8.4–10.5)
Chloride: 102 mEq/L (ref 96–112)
Creatinine, Ser: 0.83 mg/dL (ref 0.40–1.20)
GFR: 80.41 mL/min (ref >60.00)
Glucose, Bld: 63 mg/dL — ABNORMAL LOW (ref 70–99)
Potassium: 4.2 mEq/L (ref 3.5–5.1)
Sodium: 137 mEq/L (ref 135–145)
Total Bilirubin: 0.3 mg/dL (ref 0.2–1.2)
Total Protein: 7 g/dL (ref 6.0–8.3)

## 2017-08-03 LAB — CBC WITH DIFFERENTIAL/PLATELET
Basophils Absolute: 0.1 10*3/uL (ref 0.0–0.1)
Basophils Relative: 1 % (ref 0.0–3.0)
Eosinophils Absolute: 0.3 10*3/uL (ref 0.0–0.7)
Eosinophils Relative: 2.5 % (ref 0.0–5.0)
HCT: 37.6 % (ref 36.0–46.0)
Hemoglobin: 12.6 g/dL (ref 12.0–15.0)
Lymphocytes Relative: 29.2 % (ref 12.0–46.0)
Lymphs Abs: 3.2 10*3/uL (ref 0.7–4.0)
MCHC: 33.4 g/dL (ref 30.0–36.0)
MCV: 95.2 fl (ref 78.0–100.0)
Monocytes Absolute: 0.7 10*3/uL (ref 0.1–1.0)
Monocytes Relative: 6.1 % (ref 3.0–12.0)
Neutro Abs: 6.7 10*3/uL (ref 1.4–7.7)
Neutrophils Relative %: 61.2 % (ref 43.0–77.0)
Platelets: 317 10*3/uL (ref 150.0–400.0)
RBC: 3.95 Mil/uL (ref 3.87–5.11)
RDW: 12.9 % (ref 11.5–15.5)
WBC: 10.9 10*3/uL — ABNORMAL HIGH (ref 4.0–10.5)

## 2017-08-03 LAB — AMYLASE: Amylase: 29 U/L (ref 27–131)

## 2017-08-03 LAB — LIPASE: Lipase: 24 U/L (ref 11.0–59.0)

## 2017-08-06 ENCOUNTER — Other Ambulatory Visit: Payer: Self-pay | Admitting: Family Medicine

## 2017-08-06 DIAGNOSIS — R102 Pelvic and perineal pain: Secondary | ICD-10-CM

## 2017-08-06 DIAGNOSIS — N3010 Interstitial cystitis (chronic) without hematuria: Secondary | ICD-10-CM | POA: Diagnosis not present

## 2017-08-07 ENCOUNTER — Ambulatory Visit (HOSPITAL_BASED_OUTPATIENT_CLINIC_OR_DEPARTMENT_OTHER)
Admission: RE | Admit: 2017-08-07 | Discharge: 2017-08-07 | Disposition: A | Discharge status: Home / Self Care | Payer: BLUE CROSS/BLUE SHIELD | Source: Ambulatory Visit | Attending: Family Medicine | Admitting: Family Medicine

## 2017-08-07 ENCOUNTER — Encounter (HOSPITAL_BASED_OUTPATIENT_CLINIC_OR_DEPARTMENT_OTHER): Payer: Self-pay

## 2017-08-07 DIAGNOSIS — K838 Other specified diseases of biliary tract: Secondary | ICD-10-CM | POA: Insufficient documentation

## 2017-08-07 DIAGNOSIS — N3010 Interstitial cystitis (chronic) without hematuria: Secondary | ICD-10-CM | POA: Insufficient documentation

## 2017-08-07 DIAGNOSIS — R140 Abdominal distension (gaseous): Secondary | ICD-10-CM

## 2017-08-07 DIAGNOSIS — R109 Unspecified abdominal pain: Secondary | ICD-10-CM | POA: Insufficient documentation

## 2017-08-07 DIAGNOSIS — R102 Pelvic and perineal pain: Secondary | ICD-10-CM | POA: Diagnosis not present

## 2017-08-07 DIAGNOSIS — Z90710 Acquired absence of both cervix and uterus: Secondary | ICD-10-CM | POA: Diagnosis not present

## 2017-08-07 DIAGNOSIS — Z9049 Acquired absence of other specified parts of digestive tract: Secondary | ICD-10-CM | POA: Insufficient documentation

## 2017-08-07 DIAGNOSIS — R1084 Generalized abdominal pain: Secondary | ICD-10-CM

## 2017-08-08 ENCOUNTER — Telehealth: Payer: Self-pay | Admitting: Family Medicine

## 2017-08-08 NOTE — Telephone Encounter (Signed)
See imaging results

## 2017-08-08 NOTE — Telephone Encounter (Signed)
Copied from [place] [id number]. Topic: General - Other >> Aug 08, 2017 11:21 AM Yvette Rack wrote: Reason for CRM: patient calling for ultra sound results

## 2017-08-20 NOTE — Telephone Encounter (Signed)
Author phoned pt. Re: U/S results. Author notified pt. that results were insignificant. Pt states she is still having abdominal swelling that comes and goes, and that "nothing has changed". Dr Etter Sjogren to be made aware for follow-up.

## 2017-08-20 NOTE — Telephone Encounter (Signed)
We can refer to GI for abd bloating , nausea

## 2017-08-21 NOTE — Telephone Encounter (Signed)
Called pt no answer left voice mail to callback

## 2017-08-22 DIAGNOSIS — F3181 Bipolar II disorder: Secondary | ICD-10-CM | POA: Diagnosis not present

## 2017-11-13 ENCOUNTER — Encounter: Payer: Self-pay | Admitting: Nurse Practitioner

## 2017-11-13 ENCOUNTER — Ambulatory Visit (HOSPITAL_BASED_OUTPATIENT_CLINIC_OR_DEPARTMENT_OTHER)
Admission: RE | Admit: 2017-11-13 | Discharge: 2017-11-13 | Disposition: A | Discharge status: Home / Self Care | Payer: Self-pay | Source: Ambulatory Visit | Attending: Nurse Practitioner | Admitting: Nurse Practitioner

## 2017-11-13 ENCOUNTER — Ambulatory Visit (INDEPENDENT_AMBULATORY_CARE_PROVIDER_SITE_OTHER): Payer: Self-pay | Admitting: Nurse Practitioner

## 2017-11-13 VITALS — BP 108/76 | HR 103 | Temp 97.9°F | Ht 64.0 in

## 2017-11-13 DIAGNOSIS — R112 Nausea with vomiting, unspecified: Secondary | ICD-10-CM | POA: Insufficient documentation

## 2017-11-13 DIAGNOSIS — K5900 Constipation, unspecified: Secondary | ICD-10-CM | POA: Insufficient documentation

## 2017-11-13 DIAGNOSIS — R509 Fever, unspecified: Secondary | ICD-10-CM

## 2017-11-13 LAB — POCT URINALYSIS DIPSTICK
Bilirubin, UA: POSITIVE
Blood, UA: NEGATIVE
Glucose, UA: NEGATIVE
Ketones, UA: POSITIVE
Leukocytes, UA: NEGATIVE
Nitrite, UA: POSITIVE
Protein, UA: POSITIVE — AB
Spec Grav, UA: <=1.005 — AB (ref 1.010–1.025)
Urobilinogen, UA: 1 E.U./dL
pH, UA: 5 (ref 5.0–8.0)

## 2017-11-13 MED ORDER — ONDANSETRON HCL 4 MG/2ML IJ SOLN
4.0000 mg | Freq: Once | INTRAMUSCULAR | Status: AC
  Administered 2017-11-13: 4 mg via INTRAMUSCULAR

## 2017-11-13 MED ORDER — ONDANSETRON HCL 4 MG PO TABS: 4.0000 mg | ORAL_TABLET | Freq: Three times a day (TID) | ORAL | 0 refills | Status: DC | PRN

## 2017-11-13 NOTE — Progress Notes (Signed)
Subjective:  Patient ID: [name], female    DOB: Dec 28, 1975  Age: 42 y.o. MRN: [id number]  CC: Nausea (patient is complaining of nausea,cant eat anything,fever,vomit. this has been going on 5 days. took tylenol and dizzy otc. )  Abdominal Pain  This is a new problem. The current episode started in the past 7 days. The onset quality is gradual. The problem occurs constantly. The problem has been unchanged. The pain is located in the generalized abdominal region. The quality of the pain is a sensation of fullness and aching. The abdominal pain does not radiate. Associated symptoms include anorexia, constipation, a fever, nausea and vomiting. Pertinent negatives include no diarrhea, dysuria or hematochezia. Nothing aggravates the pain. The pain is relieved by nothing. She has tried nothing for the symptoms.  last BM: 1week ago. Last colonoscopy 2016: normal. S/p cholecystectomy.  Reviewed past Medical, Social and Family history today.  Outpatient Medications Prior to Visit  Medication Sig Dispense Refill  . albuterol (PROVENTIL HFA;VENTOLIN HFA) 108 (90 Base) MCG/ACT inhaler Inhale 2 puffs into the lungs every 6 (six) hours as needed for wheezing or shortness of breath. 1 Inhaler 0  . FLUoxetine (PROZAC) 10 MG capsule Take 3 capsules (30 mg total) by mouth at bedtime. For mood control 30 capsule 0  . gabapentin (NEURONTIN) 300 MG capsule Take 1 capsule (300 mg total) by mouth 3 (three) times daily. For agitation 90 capsule 0  . lamoTRIgine (LAMICTAL) 200 MG tablet Take 200 mg by mouth 2 (two) times daily.    . mirtazapine (REMERON SOL-TAB) 30 MG disintegrating tablet Take 1 tablet (30 mg total) by mouth at bedtime. For depression 30 tablet 0  . omeprazole (PRILOSEC) 40 MG capsule Take 1 capsule (40 mg total) by mouth daily. For acid reflux    . oxybutynin (DITROPAN) 5 MG tablet Take by mouth.    . QUEtiapine (SEROQUEL) 200 MG tablet Take 1 tablet (200 mg total) by mouth at bedtime. For mood  control 30 tablet 0  . risperiDONE (RISPERDAL) 2 MG tablet Take 1 tablet (2 mg total) by mouth at bedtime. For mood control 30 tablet 0  . traZODone (DESYREL) 50 MG tablet Take 1 tablet (50 mg total) by mouth at bedtime. For sleep 30 tablet 0  . clonazePAM (KLONOPIN) 0.5 MG tablet Take 1 tablet (0.5 mg total) by mouth daily. For anxiety (Patient not taking: Reported on 11/13/2017) 1 tablet 0  . hydrOXYzine (ATARAX/VISTARIL) 50 MG tablet Take 1 tablet (50 mg total) by mouth every 6 (six) hours as needed for anxiety. (Patient not taking: Reported on 08/02/2017) 60 tablet 0  . nicotine (NICODERM CQ - DOSED IN MG/24 HOURS) 21 mg/24hr patch Place 1 patch (21 mg total) onto the skin daily. (May buy from OTC): For smoking cessation (Patient not taking: Reported on 11/13/2017) 28 patch 0   No facility-administered medications prior to visit.     ROS See HPI  Objective:  BP 108/76   Pulse (!) 103   Temp 97.9 F (36.6 C) (Oral)   Ht 5\' 4"  (1.626 m)   SpO2 98%   BMI 20.49 kg/m   BP Readings from Last 3 Encounters:  11/13/17 108/76  08/02/17 (!) 100/58  04/19/17 106/80    Wt Readings from Last 3 Encounters:  08/02/17 119 lb 6.4 oz (54.2 kg)  04/19/17 106 lb 6.4 oz (48.3 kg)  02/22/17 104 lb 9.6 oz (47.4 kg)    Physical Exam  Constitutional: She is oriented to  person, place, and time. No distress.  Cardiovascular: Normal rate and regular rhythm.  Pulmonary/Chest: Effort normal and breath sounds normal.  Abdominal: Soft. Bowel sounds are normal. She exhibits no distension and no mass. There is tenderness. There is no guarding.  Diffuse tenderness, rounded ABD, soft. No sign of acute ABD  Neurological: She is alert and oriented to person, place, and time.  Skin: No rash noted.  Psychiatric: She has a normal mood and affect. Her behavior is normal. Thought content normal.  Vitals reviewed.   Lab Results  Component Value Date   WBC 10.9 (H) 08/02/2017   HGB 12.6 08/02/2017   HCT 37.6  08/02/2017   PLT 317.0 08/02/2017   GLUCOSE 63 (L) 08/02/2017   CHOL 143 04/24/2017   TRIG 85 04/24/2017   HDL 49 04/24/2017   LDLCALC 77 04/24/2017   ALT 28 08/02/2017   AST 31 08/02/2017   NA 137 08/02/2017   K 4.2 08/02/2017   CL 102 08/02/2017   CREATININE 0.83 08/02/2017   BUN 8 08/02/2017   CO2 29 08/02/2017   TSH 2.184 04/24/2017   HGBA1C 5.0 04/24/2017    US Abdomen Complete  Result Date: 08/08/2017 CLINICAL DATA:  Abdominal pain with bloating EXAM: ABDOMEN ULTRASOUND COMPLETE COMPARISON:  CT 07/31/2016 FINDINGS: Gallbladder: Surgically absent. Common bile duct: Diameter: Up to 1.1 cm Liver: Slight increased hepatic echogenicity. No focal abnormality. Portal vein is patent on color Doppler imaging with normal direction of blood flow towards the liver. IVC: No abnormality visualized. Pancreas: Visualized portion unremarkable. Spleen: Size and appearance within normal limits. Right Kidney: Length: 10.1 cm. Echogenicity within normal limits. No mass or hydronephrosis visualized. Left Kidney: Length: 10.5 cm. Echogenicity within normal limits. No mass or hydronephrosis visualized. Abdominal aorta: No aneurysm visualized. Other findings: None. IMPRESSION: 1. Status post cholecystectomy. Dilated common bile duct up to 1.1 cm, grossly similar compared to 2018, presumably due to post cholecystectomy changes in the absence of abnormal LFTs. 2. Slight increased hepatic echogenicity, possible mild steatosis Electronically Signed   By: Donavan Foil M.D.   On: 08/08/2017 02:28   US Pelvis Transvanginal Non-ob (tv Only)  Result Date: 08/08/2017 CLINICAL DATA:  Bloating, history of hysterectomy and salpingectomy EXAM: TRANSABDOMINAL AND TRANSVAGINAL ULTRASOUND OF PELVIS TECHNIQUE: Both transabdominal and transvaginal ultrasound examinations of the pelvis were performed. Transabdominal technique was performed for global imaging of the pelvis including uterus, ovaries, adnexal regions, and pelvic  cul-de-sac. It was necessary to proceed with endovaginal exam following the transabdominal exam to visualize the adnexa. COMPARISON:  CT 07/31/2016 FINDINGS: Uterus Surgically absent.  Vaginal cuff within normal limits. Endometrium Surgically absent Right ovary Surgically absent Left ovary Surgically absent Other findings No abnormal free fluid. IMPRESSION: Surgical absence of the uterus and ovaries. No specific abnormality is seen. Electronically Signed   By: Donavan Foil M.D.   On: 08/08/2017 02:29   US Pelvis (transabdominal Only)  Result Date: 08/08/2017 CLINICAL DATA:  Bloating, history of hysterectomy and salpingectomy EXAM: TRANSABDOMINAL AND TRANSVAGINAL ULTRASOUND OF PELVIS TECHNIQUE: Both transabdominal and transvaginal ultrasound examinations of the pelvis were performed. Transabdominal technique was performed for global imaging of the pelvis including uterus, ovaries, adnexal regions, and pelvic cul-de-sac. It was necessary to proceed with endovaginal exam following the transabdominal exam to visualize the adnexa. COMPARISON:  CT 07/31/2016 FINDINGS: Uterus Surgically absent.  Vaginal cuff within normal limits. Endometrium Surgically absent Right ovary Surgically absent Left ovary Surgically absent Other findings No abnormal free fluid. IMPRESSION: Surgical absence of  the uterus and ovaries. No specific abnormality is seen. Electronically Signed   By: Donavan Foil M.D.   On: 08/08/2017 02:29    Assessment & Plan:   Jazmina was seen today for nausea.  Diagnoses and all orders for this visit:  Intractable vomiting with nausea, unspecified vomiting type -     POCT urinalysis dipstick -     ondansetron (ZOFRAN) injection 4 mg -     ondansetron (ZOFRAN) 4 MG tablet; Take 1 tablet (4 mg total) by mouth every 8 (eight) hours as needed for nausea or vomiting. -     DG Abd 2 Views; Future  Fever and chills -     POCT urinalysis dipstick -     ondansetron (ZOFRAN) injection 4 mg  Constipation,  unspecified constipation type -     DG Abd 2 Views; Future   I am having Lyberti R. Boye start on ondansetron. I am also having her maintain her albuterol, clonazePAM, FLUoxetine, gabapentin, nicotine, omeprazole, QUEtiapine, risperiDONE, traZODone, mirtazapine, hydrOXYzine, oxybutynin, and lamoTRIgine. We administered ondansetron.  Meds ordered this encounter  Medications  . ondansetron (ZOFRAN) injection 4 mg  . ondansetron (ZOFRAN) 4 MG tablet    Sig: Take 1 tablet (4 mg total) by mouth every 8 (eight) hours as needed for nausea or vomiting.    Dispense:  20 tablet    Refill:  0    Order Specific Question:   Supervising Provider    Answer:   Lucille Passy [[id number]]    Follow-up: No follow-ups on file.  Wilfred Lacy, NP

## 2017-11-14 ENCOUNTER — Encounter: Payer: Self-pay | Admitting: Nurse Practitioner

## 2017-11-14 NOTE — Patient Instructions (Addendum)
Normal urinalysis. Normal ABD x-ray. Use zofran for nausea. Maintain adequate oral hydration and clear liquid diet for 24hrs, then advance as tolerated.  Order CT ABD/pelvis with constrast if no improvement in 48hrs.   Clear Liquid Diet, Adult A clear liquid diet is a diet that includes only liquids that you can see through. You may need to follow a clear liquid diet if:  You develop a medical condition right before or after you have surgery.  You were not able to eat food for a long period of time.  You had a condition that gave you diarrhea.  You are going to have an exam, such as a colonoscopy, in which instruments will be put into your body to look at parts of your digestive system.  You are going to have bowel surgery.  The usual goals of this diet are:  To rest the stomach and digestive system as much as possible.  To keep you hydrated.  To make sure you get some calories for energy.  To help you return to normal digestion.  Most people need to follow this diet for only a short period of time. What do I need to know about this diet?  A clear liquid is a liquid that you can see through when you hold it up to a light.  A clear liquid diet does not provide all the nutrients that you need. It is important to choose a variety of the liquids that are allowed on this diet. That way, you will get as many nutrients as possible.  If you are not sure whether you can have certain items, ask your health care provider. What can I have?  Water and flavored water.  Fruit juices that do not have pulp, such as cranberry juice and apple juice.  Tea and coffee without milk or cream.  Clear bouillon or broth.  Broth-based soups that have been strained.  Flavored gelatins.  Honey.  Sugar water.  Frozen ice or frozen ice pops that do not contain milk, yogurt, fruit pieces, or fruit pulp.  Clear sodas.  Clear sports drinks. The items listed above may not be a complete list  of recommended liquids. Contact your dietitian for more options. What can I not have?  Juices that have pulp.  Milk.  Cream or cream-based soups.  Yogurt. The items listed above may not be a complete list of liquids to avoid. Contact your dietitian for more information. Summary  A clear liquid diet is a diet that includes only liquids that you can see through.  The goal of this diet is to help you recover by resting your digestive system, keeping you hydrated, and providing nutrients.  Make sure to avoid liquids with milk, cream, or pulp while on this diet. This information is not intended to replace advice given to you by your health care provider. Make sure you discuss any questions you have with your health care provider. Document Released: 03/27/2005 Document Revised: 11/09/2015 Document Reviewed: 02/21/2013 Elsevier Interactive Patient Education  [employer].

## 2017-12-07 ENCOUNTER — Ambulatory Visit: Payer: Self-pay | Admitting: Family Medicine

## 2017-12-11 ENCOUNTER — Encounter: Payer: Self-pay | Admitting: Family Medicine

## 2017-12-11 ENCOUNTER — Ambulatory Visit: Payer: BLUE CROSS/BLUE SHIELD | Admitting: Family Medicine

## 2017-12-11 ENCOUNTER — Ambulatory Visit (HOSPITAL_BASED_OUTPATIENT_CLINIC_OR_DEPARTMENT_OTHER)
Admission: RE | Admit: 2017-12-11 | Discharge: 2017-12-11 | Disposition: A | Discharge status: Home / Self Care | Payer: BLUE CROSS/BLUE SHIELD | Source: Ambulatory Visit | Attending: Family Medicine | Admitting: Family Medicine

## 2017-12-11 VITALS — BP 103/66 | HR 107 | Temp 97.7°F | Resp 16 | Ht 64.0 in | Wt 124.4 lb

## 2017-12-11 DIAGNOSIS — R112 Nausea with vomiting, unspecified: Secondary | ICD-10-CM

## 2017-12-11 DIAGNOSIS — R195 Other fecal abnormalities: Secondary | ICD-10-CM | POA: Diagnosis not present

## 2017-12-11 DIAGNOSIS — R5383 Other fatigue: Secondary | ICD-10-CM | POA: Diagnosis not present

## 2017-12-11 DIAGNOSIS — R059 Cough, unspecified: Secondary | ICD-10-CM

## 2017-12-11 DIAGNOSIS — R05 Cough: Secondary | ICD-10-CM

## 2017-12-11 DIAGNOSIS — J449 Chronic obstructive pulmonary disease, unspecified: Secondary | ICD-10-CM | POA: Diagnosis not present

## 2017-12-11 DIAGNOSIS — R0602 Shortness of breath: Secondary | ICD-10-CM | POA: Diagnosis not present

## 2017-12-11 LAB — POC URINALSYSI DIPSTICK (AUTOMATED)
Bilirubin, UA: NEGATIVE
Blood, UA: NEGATIVE
Glucose, UA: NEGATIVE
Ketones, UA: NEGATIVE
Leukocytes, UA: NEGATIVE
Nitrite, UA: NEGATIVE
Protein, UA: NEGATIVE
Spec Grav, UA: 1.015 (ref 1.010–1.025)
Urobilinogen, UA: 0.2 E.U./dL
pH, UA: 6 (ref 5.0–8.0)

## 2017-12-11 MED ORDER — AZITHROMYCIN 250 MG PO TABS: ORAL_TABLET | ORAL | 0 refills | Status: DC

## 2017-12-11 MED ORDER — ONDANSETRON HCL 4 MG PO TABS: 4.0000 mg | ORAL_TABLET | Freq: Three times a day (TID) | ORAL | 0 refills | Status: DC | PRN

## 2017-12-11 NOTE — Patient Instructions (Signed)

## 2017-12-11 NOTE — Progress Notes (Signed)
Patient ID: [name], female   DOB: [birth year], 42 y.o.   MRN: [id number]     Subjective:  I acted as a Education administrator for Dr. Carollee Herter.  [name], [place]   Patient ID: [name], female    DOB: Sep 08, 1975, 42 y.o.   MRN: [id number]  Chief Complaint  Patient presents with  . Fatigue    HPI  Patient is in today for fatigue and f/u nausea and vomiting.  The fatigue is severe.  She is also coughing--- green mucus No fevers.  No cp, no palpitations.     Patient Care Team: Carollee Herter, Alferd Apa, DO as PCP - General Marisue Ivan, MD as Referring Physician (Urology)   Past Medical History:  Diagnosis Date  . 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 Anal fissure 03/11/2009  . Anemia   . Anxiety   . Anxiety and depression   . ARTHRITIS 08/31/2007  . Arthritis   . Asthma   . 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 Bipolar 1 disorder ([place])   . Bowel obstruction ([place])   . BRONCHITIS, RECURRENT 08/23/2009   Asthmatic Bronchitis-Dr. Melvyn Novas.....-HFA 75% 12/04/2008>75% 02/05/2009>75% 08/04/2009 -PFT's 01/04/2009 2.56 (86%) ratio 75, no resp to B2 and DLC0 67% > 80 after correction   . Cancer (HCC)    cervical cancer  . Chronic interstitial cystitis 03/11/2009  . Chronic nausea   . Chronic pain   . Colon polyps   . COLONIC POLYPS, HX OF 07/25/2006   ADENOMATOUS POLYP  . COPD (chronic obstructive pulmonary disease) ([place])   . DEPRESSION 08/31/2007  . Endometriosis   . FIBROMYALGIA 08/31/2007  . Fibromyalgia   . GERD 02/05/2009  . Hyperlipidemia   . HYPERTENSION 08/31/2007  . IBS 03/11/2009  . Internal hemorrhoids   . Migraine headache   . 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 PONV (postoperative nausea and vomiting)   . RECTAL BLEEDING 03/11/2009  . Seizures ([place])    been about 1 year since last seisure per pt  . SLEEP APNEA 08/31/2007  . Thyroid disease   . Uterine cyst     Past Surgical History:  Procedure Laterality Date  . ABDOMINAL HYSTERECTOMY    . bladder stretching x6    . BLADDER  SURGERY     stimulator placed and stretching   . CHOLECYSTECTOMY    . COLONOSCOPY    . COLONOSCOPY WITH PROPOFOL N/A 09/03/2014   Procedure: COLONOSCOPY WITH PROPOFOL;  Surgeon: Milus Banister, MD;  Location: [facility];  Service: Endoscopy;  Laterality: N/A;  . interstitial cystitis    . PACEMAKER INSERTION     in hip for interstitial cystitis  . removal of uterine cyst and scrapped uterus    . replaced bladder pacemaker      Family History  Problem Relation Age of Onset  . Heart disease Father   . Asthma Maternal Grandmother   . Emphysema Maternal Grandfather   . Cancer Maternal Grandfather        Lung Cancer  . Cancer Other        Lung Cancer-Aunt  . Colon cancer Neg Hx   . Esophageal cancer Neg Hx   . Rectal cancer Neg Hx   . Stomach cancer Neg Hx   . Thyroid disease Neg Hx     Social History   Socioeconomic History  . Marital status: Married    Spouse name: Not on file  . Number of children: 2  . Years of education: Not on file  . Highest education  level: Not on file  Occupational History    Employer: UNEMPLOYED  Social Needs  . Financial resource strain: Not on file  . Food insecurity:    Worry: Not on file    Inability: Not on file  . Transportation needs:    Medical: Not on file    Non-medical: Not on file  Tobacco Use  . Smoking status: Current Every Day Smoker    Packs/day: 1.00    Years: 14.00    Pack years: 14.00    Types: Cigarettes  . Smokeless tobacco: Never Used  . Tobacco comment: trying to quit  Substance and Sexual Activity  . Alcohol use: Not Currently    Alcohol/week: 0.0 standard drinks    Comment: rare wine   . Drug use: Not Currently    Types: Marijuana    Comment: once a month  . Sexual activity: Not on file  Lifestyle  . Physical activity:    Days per week: Not on file    Minutes per session: Not on file  . Stress: Not on file  Relationships  . Social connections:    Talks on phone: Not on file    Gets together: Not on  file    Attends religious service: Not on file    Active member of club or organization: Not on file    Attends meetings of clubs or organizations: Not on file    Relationship status: Not on file  . Intimate partner violence:    Fear of current or ex partner: Not on file    Emotionally abused: Not on file    Physically abused: Not on file    Forced sexual activity: Not on file  Other Topics Concern  . Not on file  Social History Narrative   Homemaker   Daily Caffeine Use-Mtn. Dew          Outpatient Medications Prior to Visit  Medication Sig Dispense Refill  . albuterol (PROVENTIL HFA;VENTOLIN HFA) 108 (90 Base) MCG/ACT inhaler Inhale 2 puffs into the lungs every 6 (six) hours as needed for wheezing or shortness of breath. 1 Inhaler 0  . busPIRone (BUSPAR) 10 MG tablet Take 10 mg by mouth 3 (three) times daily.    Marland Kitchen FLUoxetine (PROZAC) 10 MG capsule Take 3 capsules (30 mg total) by mouth at bedtime. For mood control 30 capsule 0  . gabapentin (NEURONTIN) 300 MG capsule Take 1 capsule (300 mg total) by mouth 3 (three) times daily. For agitation 90 capsule 0  . lamoTRIgine (LAMICTAL) 200 MG tablet Take 200 mg by mouth 2 (two) times daily.    . mirtazapine (REMERON SOL-TAB) 30 MG disintegrating tablet Take 1 tablet (30 mg total) by mouth at bedtime. For depression 30 tablet 0  . omeprazole (PRILOSEC) 40 MG capsule Take 1 capsule (40 mg total) by mouth daily. For acid reflux    . QUEtiapine (SEROQUEL) 200 MG tablet Take 1 tablet (200 mg total) by mouth at bedtime. For mood control 30 tablet 0  . risperiDONE (RISPERDAL) 2 MG tablet Take 1 tablet (2 mg total) by mouth at bedtime. For mood control 30 tablet 0  . traZODone (DESYREL) 50 MG tablet Take 1 tablet (50 mg total) by mouth at bedtime. For sleep 30 tablet 0  . ondansetron (ZOFRAN) 4 MG tablet Take 1 tablet (4 mg total) by mouth every 8 (eight) hours as needed for nausea or vomiting. 20 tablet 0  . clonazePAM (KLONOPIN) 0.5 MG tablet  Take 1 tablet (0.5  mg total) by mouth daily. For anxiety (Patient not taking: Reported on 11/13/2017) 1 tablet 0  . hydrOXYzine (ATARAX/VISTARIL) 50 MG tablet Take 1 tablet (50 mg total) by mouth every 6 (six) hours as needed for anxiety. (Patient not taking: Reported on 08/02/2017) 60 tablet 0  . nicotine (NICODERM CQ - DOSED IN MG/24 HOURS) 21 mg/24hr patch Place 1 patch (21 mg total) onto the skin daily. (May buy from OTC): For smoking cessation (Patient not taking: Reported on 11/13/2017) 28 patch 0  . oxybutynin (DITROPAN) 5 MG tablet Take by mouth.     No facility-administered medications prior to visit.     Allergies  Allergen Reactions  . Abilify [Aripiprazole] Swelling, Other (See Comments) and Palpitations    tremors Throat swelling, tremors  . Metoclopramide Hcl Other (See Comments)    Causes seizures  . Propoxyphene Rash  . Tramadol Swelling, Other (See Comments) and Rash    Throat swelling, tremors  . Ambien [Zolpidem Tartrate] Other (See Comments)    hallucinations  . Eszopiclone Other (See Comments)    Hallucinations, hyper, bad taste in mouth   . Amitriptyline     Other reaction(s): Angioedema (ALLERGY/intolerance)  . Metoclopramide     Other reaction(s): Other (See Comments) Seizures  . Other Swelling    Acetaminophen #3 Acetaminophen #3  . Varenicline Other (See Comments)    Suicidal thoughts  . Buprenorphine Hcl Itching and Hives  . Demerol [Meperidine] Rash  . Emetrol Itching, Rash and Hives    Other reaction(s): Rash (ALLERGY/intolerance)  . Morphine And Related Hives and Itching  . Propoxyphene N-Acetaminophen Hives, Itching and Rash    Other reaction(s): Rash (ALLERGY/intolerance)    Review of Systems  Constitutional: Positive for malaise/fatigue. Negative for fever.  HENT: Negative for congestion.   Eyes: Negative for blurred vision.  Respiratory: Negative for cough and shortness of breath.   Cardiovascular: Negative for chest pain, palpitations and  leg swelling.  Gastrointestinal: Positive for diarrhea, heartburn, nausea and vomiting.  Musculoskeletal: Negative for back pain.  Skin: Negative for rash.  Neurological: Negative for loss of consciousness and headaches.       Objective:    Physical Exam  Constitutional: She is oriented to person, place, and time. She appears well-developed and well-nourished.  HENT:  Head: Normocephalic and atraumatic.  Eyes: Conjunctivae and EOM are normal.  Neck: Normal range of motion. Neck supple. No JVD present. Carotid bruit is not present. No thyromegaly present.  Cardiovascular: Normal rate, regular rhythm and normal heart sounds.  No murmur heard. Pulmonary/Chest: Effort normal and breath sounds normal. No respiratory distress. She has no wheezes. She has no rales. She exhibits no tenderness.  Abdominal: Soft. She exhibits no distension. There is no tenderness.  Musculoskeletal: She exhibits no edema.  Neurological: She is alert and oriented to person, place, and time.  Psychiatric: She has a normal mood and affect.  Nursing note and vitals reviewed.   BP 103/66 (BP Location: Right Arm, Cuff Size: Normal)   Pulse (!) 107   Temp 97.7 F (36.5 C) (Oral)   Resp 16   Ht 5\' 4"  (1.626 m)   Wt 124 lb 6.4 oz (56.4 kg)   SpO2 98%   BMI 21.35 kg/m  Wt Readings from Last 3 Encounters:  12/11/17 124 lb 6.4 oz (56.4 kg)  08/02/17 119 lb 6.4 oz (54.2 kg)  04/19/17 106 lb 6.4 oz (48.3 kg)   BP Readings from Last 3 Encounters:  12/11/17 103/66  11/13/17 108/76  08/02/17 (!) 100/58     Immunization History  Administered Date(s) Administered  . Tdap 05/05/2015    Health Maintenance  Topic Date Due  . HIV Screening  04/24/1991  . PAP SMEAR  09/24/2017  . INFLUENZA VACCINE  11/08/2017  . COLONOSCOPY  09/03/2019  . TETANUS/TDAP  05/04/2025    Lab Results  Component Value Date   WBC 10.9 (H) 08/02/2017   HGB 12.6 08/02/2017   HCT 37.6 08/02/2017   PLT 317.0 08/02/2017   GLUCOSE  63 (L) 08/02/2017   CHOL 143 04/24/2017   TRIG 85 04/24/2017   HDL 49 04/24/2017   LDLCALC 77 04/24/2017   ALT 28 08/02/2017   AST 31 08/02/2017   NA 137 08/02/2017   K 4.2 08/02/2017   CL 102 08/02/2017   CREATININE 0.83 08/02/2017   BUN 8 08/02/2017   CO2 29 08/02/2017   TSH 2.184 04/24/2017   HGBA1C 5.0 04/24/2017    Lab Results  Component Value Date   TSH 2.184 04/24/2017   Lab Results  Component Value Date   WBC 10.9 (H) 08/02/2017   HGB 12.6 08/02/2017   HCT 37.6 08/02/2017   MCV 95.2 08/02/2017   PLT 317.0 08/02/2017   Lab Results  Component Value Date   NA 137 08/02/2017   K 4.2 08/02/2017   CO2 29 08/02/2017   GLUCOSE 63 (L) 08/02/2017   BUN 8 08/02/2017   CREATININE 0.83 08/02/2017   BILITOT 0.3 08/02/2017   ALKPHOS 105 08/02/2017   AST 31 08/02/2017   ALT 28 08/02/2017   PROT 7.0 08/02/2017   ALBUMIN 3.9 08/02/2017   CALCIUM 9.3 08/02/2017   ANIONGAP 8 04/24/2017   GFR 80.41 08/02/2017   Lab Results  Component Value Date   CHOL 143 04/24/2017   Lab Results  Component Value Date   HDL 49 04/24/2017   Lab Results  Component Value Date   LDLCALC 77 04/24/2017   Lab Results  Component Value Date   TRIG 85 04/24/2017   Lab Results  Component Value Date   CHOLHDL 2.9 04/24/2017   Lab Results  Component Value Date   HGBA1C 5.0 04/24/2017         Assessment & Plan:   Problem List Items Addressed This Visit    None    Visit Diagnoses    Fatigue, unspecified type    -  Primary   Relevant Medications   azithromycin (ZITHROMAX Z-PAK) 250 MG tablet   Other Relevant Orders   POCT Urinalysis Dipstick (Automated) (Completed)   CBC with Differential/Platelet   Comprehensive metabolic panel   Vitamin J57   TSH   Vitamin D 1,25 dihydroxy   DG Chest 2 View   Cough       Relevant Medications   azithromycin (ZITHROMAX Z-PAK) 250 MG tablet   Other Relevant Orders   DG Chest 2 View   Intractable vomiting with nausea, unspecified  vomiting type       Relevant Medications   ondansetron (ZOFRAN) 4 MG tablet   Loose stools       Relevant Orders   Fecal occult blood, imunochemical([facility])      I have discontinued Shaniece R. Taddeo's clonazePAM, nicotine, hydrOXYzine, and oxybutynin. I am also having her start on azithromycin. Additionally, I am having her maintain her albuterol, FLUoxetine, gabapentin, omeprazole, QUEtiapine, risperiDONE, traZODone, mirtazapine, lamoTRIgine, busPIRone, and ondansetron.  Meds ordered this encounter  Medications  . azithromycin (ZITHROMAX Z-PAK) 250 MG tablet    Sig: As  directed    Dispense:  6 each    Refill:  0  . ondansetron (ZOFRAN) 4 MG tablet    Sig: Take 1 tablet (4 mg total) by mouth every 8 (eight) hours as needed for nausea or vomiting.    Dispense:  45 tablet    Refill:  0    CMA served as scribe during this visit. History, Physical and Plan performed by medical provider. Documentation and orders reviewed and attested to.  [name], DO

## 2017-12-12 LAB — CBC WITH DIFFERENTIAL/PLATELET
Basophils Absolute: 0.1 10*3/uL (ref 0.0–0.1)
Basophils Relative: 1.1 % (ref 0.0–3.0)
Eosinophils Absolute: 0.1 10*3/uL (ref 0.0–0.7)
Eosinophils Relative: 1.6 % (ref 0.0–5.0)
HCT: 35.4 % — ABNORMAL LOW (ref 36.0–46.0)
Hemoglobin: 12.1 g/dL (ref 12.0–15.0)
Lymphocytes Relative: 44.1 % (ref 12.0–46.0)
Lymphs Abs: 3.4 10*3/uL (ref 0.7–4.0)
MCHC: 34.1 g/dL (ref 30.0–36.0)
MCV: 94.9 fl (ref 78.0–100.0)
Monocytes Absolute: 0.5 10*3/uL (ref 0.1–1.0)
Monocytes Relative: 6.5 % (ref 3.0–12.0)
Neutro Abs: 3.6 10*3/uL (ref 1.4–7.7)
Neutrophils Relative %: 46.7 % (ref 43.0–77.0)
Platelets: 310 10*3/uL (ref 150.0–400.0)
RBC: 3.73 Mil/uL — ABNORMAL LOW (ref 3.87–5.11)
RDW: 14.4 % (ref 11.5–15.5)
WBC: 7.7 10*3/uL (ref 4.0–10.5)

## 2017-12-12 LAB — COMPREHENSIVE METABOLIC PANEL
ALT: 8 U/L (ref 0–35)
AST: 13 U/L (ref 0–37)
Albumin: 3.5 g/dL (ref 3.5–5.2)
Alkaline Phosphatase: 85 U/L (ref 39–117)
BUN: 2 mg/dL — ABNORMAL LOW (ref 6–23)
CO2: 28 mEq/L (ref 19–32)
Calcium: 8.7 mg/dL (ref 8.4–10.5)
Chloride: 107 mEq/L (ref 96–112)
Creatinine, Ser: 0.91 mg/dL (ref 0.40–1.20)
GFR: 72.18 mL/min (ref >60.00)
Glucose, Bld: 82 mg/dL (ref 70–99)
Potassium: 3 mEq/L — ABNORMAL LOW (ref 3.5–5.1)
Sodium: 142 mEq/L (ref 135–145)
Total Bilirubin: 0.2 mg/dL (ref 0.2–1.2)
Total Protein: 6.1 g/dL (ref 6.0–8.3)

## 2017-12-12 LAB — VITAMIN B12: Vitamin B-12: 270 pg/mL (ref 211–911)

## 2017-12-12 LAB — TSH: TSH: 1.1 u[IU]/mL (ref 0.35–4.50)

## 2017-12-13 ENCOUNTER — Other Ambulatory Visit: Payer: Self-pay | Admitting: Family Medicine

## 2017-12-13 LAB — VITAMIN D 1,25 DIHYDROXY
Vitamin D 1, 25 (OH)2 Total: 44 pg/mL (ref 18–72)
Vitamin D2 1, 25 (OH)2: <8 pg/mL
Vitamin D3 1, 25 (OH)2: 44 pg/mL

## 2017-12-13 MED ORDER — BUDESONIDE-FORMOTEROL FUMARATE 80-4.5 MCG/ACT IN AERO: 2.0000 | INHALATION_SPRAY | Freq: Two times a day (BID) | RESPIRATORY_TRACT | 2 refills | Status: DC

## 2017-12-14 ENCOUNTER — Other Ambulatory Visit: Payer: Self-pay | Admitting: Family Medicine

## 2017-12-14 DIAGNOSIS — E876 Hypokalemia: Secondary | ICD-10-CM

## 2017-12-14 MED ORDER — POTASSIUM CHLORIDE CRYS ER 20 MEQ PO TBCR: 20.0000 meq | EXTENDED_RELEASE_TABLET | Freq: Every day | ORAL | 1 refills | Status: DC

## 2017-12-18 ENCOUNTER — Other Ambulatory Visit: Payer: Self-pay

## 2017-12-18 ENCOUNTER — Encounter (HOSPITAL_COMMUNITY): Payer: Self-pay | Admitting: Emergency Medicine

## 2017-12-18 ENCOUNTER — Emergency Department (HOSPITAL_COMMUNITY)
Admission: EM | Admit: 2017-12-18 | Discharge: 2017-12-18 | Disposition: A | Discharge status: Home / Self Care | Payer: BLUE CROSS/BLUE SHIELD | Attending: Emergency Medicine | Admitting: Emergency Medicine

## 2017-12-18 DIAGNOSIS — I10 Essential (primary) hypertension: Secondary | ICD-10-CM | POA: Diagnosis not present

## 2017-12-18 DIAGNOSIS — Z79899 Other long term (current) drug therapy: Secondary | ICD-10-CM | POA: Diagnosis not present

## 2017-12-18 DIAGNOSIS — Z859 Personal history of malignant neoplasm, unspecified: Secondary | ICD-10-CM | POA: Diagnosis not present

## 2017-12-18 DIAGNOSIS — J45909 Unspecified asthma, uncomplicated: Secondary | ICD-10-CM | POA: Diagnosis not present

## 2017-12-18 DIAGNOSIS — Z87891 Personal history of nicotine dependence: Secondary | ICD-10-CM | POA: Insufficient documentation

## 2017-12-18 DIAGNOSIS — L299 Pruritus, unspecified: Secondary | ICD-10-CM

## 2017-12-18 DIAGNOSIS — Z960 Presence of urogenital implants: Secondary | ICD-10-CM | POA: Diagnosis not present

## 2017-12-18 MED ORDER — HYDROXYZINE HCL 25 MG PO TABS: 25.0000 mg | ORAL_TABLET | Freq: Three times a day (TID) | ORAL | 0 refills | Status: DC | PRN

## 2017-12-18 MED ORDER — HYDROXYZINE HCL 25 MG PO TABS
25.0000 mg | ORAL_TABLET | Freq: Once | ORAL | Status: AC
  Administered 2017-12-18: 25 mg via ORAL
  Filled 2017-12-18: qty 1

## 2017-12-18 NOTE — ED Provider Notes (Signed)
[facility] EMERGENCY DEPARTMENT Provider Note   CSN: [id number] Arrival date & time: 12/18/17  1638     History   Chief Complaint Chief Complaint  Patient presents with  . Pruritis    HPI [name] is a 42 y.o. female.  HPI Patient presents with concern of diffuse itchiness. Onset was a few hours prior to ED arrival, and she recalls pain in her usual state of health until doing some work and using both Caulk and paint.  She denies similar reactions to either of these substances in the past. Onset she has had diffuse pruritus, without difficulty breathing, speaking, but with no relief in spite of using Benadryl.  Past Medical History:  Diagnosis Date  . 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 Anal fissure 03/11/2009  . Anemia   . Anxiety   . Anxiety and depression   . ARTHRITIS 08/31/2007  . Arthritis   . Asthma   . 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 Bipolar 1 disorder ([place])   . Bowel obstruction ([place])   . BRONCHITIS, RECURRENT 08/23/2009   Asthmatic Bronchitis-Dr. Melvyn Novas.....-HFA 75% 12/04/2008>75% 02/05/2009>75% 08/04/2009 -PFT's 01/04/2009 2.56 (86%) ratio 75, no resp to B2 and DLC0 67% > 80 after correction   . Cancer (HCC)    cervical cancer  . Chronic interstitial cystitis 03/11/2009  . Chronic nausea   . Chronic pain   . Colon polyps   . COLONIC POLYPS, HX OF 07/25/2006   ADENOMATOUS POLYP  . COPD (chronic obstructive pulmonary disease) ([place])   . DEPRESSION 08/31/2007  . Endometriosis   . FIBROMYALGIA 08/31/2007  . Fibromyalgia   . GERD 02/05/2009  . Hyperlipidemia   . HYPERTENSION 08/31/2007  . IBS 03/11/2009  . Internal hemorrhoids   . Migraine headache   . 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 PONV (postoperative nausea and vomiting)   . RECTAL BLEEDING 03/11/2009  . Seizures ([place])    been about 1 year since last seisure per pt  . SLEEP APNEA 08/31/2007  . Thyroid disease   . Uterine cyst     Patient Active Problem List   Diagnosis Date Noted  . MDD  (major depressive disorder) 04/23/2017  . Substance abuse ([place]) 04/19/2017  . Hyperthyroidism 02/17/2015  . Anxiety 01/28/2015  . Clinical depression 01/28/2015  . COPD exacerbation ([place]) 11/26/2013  . Vaginitis and vulvovaginitis 11/26/2013  . Recurrent pneumonia 02/02/2013  . Left arm pain 01/13/2013  . Left arm pain 01/13/2013    Class: Acute  . Dizziness and giddiness 01/13/2013  . Acute bronchitis 12/07/2012  . Rib pain 12/07/2012  . Insomnia 08/26/2012  . Diarrhea 06/19/2012  . Heme positive stool 06/19/2012  . Bloating 06/19/2012  . Nausea alone 06/19/2012  . Bladder retention 04/24/2012  . Tremor 03/05/2012  . Dysuria 03/05/2012  . Failure to thrive in adult 06/21/2011  . Airway hyperreactivity 03/08/2011  . Back pain, chronic 03/08/2011  . Chronic obstructive pulmonary disease ([place]) 03/08/2011  . Acid reflux 03/08/2011  . Cystitis 01/22/2011  . Bilateral hand numbness 01/13/2011  . WEIGHT LOSS 06/24/2010  . RIB PAIN, LEFT SIDED 05/04/2010  . ABDOMINAL PAIN, LEFT UPPER QUADRANT 05/04/2010  . Unspecified otitis media 10/26/2009  . BRONCHITIS, RECURRENT 08/23/2009  . URI, ACUTE 08/04/2009  . FATIGUE 07/06/2009  . IBS 03/11/2009  . ANAL FISSURE 03/11/2009  . RECTAL BLEEDING 03/11/2009  . Chronic interstitial cystitis 03/11/2009  . COLONIC POLYPS, HX OF 03/11/2009  . GERD 02/05/2009  . SMOKER 12/04/2008  . chronic asthma poorly controlled 12/04/2008  .  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 Bipolar disorder with depression ([place]) 08/31/2007  . HYPERTENSION 08/31/2007  . EMPHYSEMA 08/31/2007  . 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 ARTHRITIS 08/31/2007  . FIBROMYALGIA 08/31/2007  . SLEEP APNEA 08/31/2007    Past Surgical History:  Procedure Laterality Date  . ABDOMINAL HYSTERECTOMY    . bladder stretching x6    . BLADDER SURGERY     stimulator placed and stretching   . CHOLECYSTECTOMY    . COLONOSCOPY    . COLONOSCOPY  WITH PROPOFOL N/A 09/03/2014   Procedure: COLONOSCOPY WITH PROPOFOL;  Surgeon: Milus Banister, MD;  Location: [facility];  Service: Endoscopy;  Laterality: N/A;  . interstitial cystitis    . PACEMAKER INSERTION     in hip for interstitial cystitis  . removal of uterine cyst and scrapped uterus    . replaced bladder pacemaker       OB History    Gravida  3   Para  2   Term      Preterm      AB  1   Living  2     SAB  1   TAB  0   Ectopic  0   Multiple  0   Live Births  2            Home Medications    Prior to Admission medications   Medication Sig Start Date End Date Taking? Authorizing Provider  albuterol (PROVENTIL HFA;VENTOLIN HFA) 108 (90 Base) MCG/ACT inhaler Inhale 2 puffs into the lungs every 6 (six) hours as needed for wheezing or shortness of breath. 04/25/17   Lindell Spar I, NP  azithromycin (ZITHROMAX Z-PAK) 250 MG tablet As directed 12/11/17   Carollee Herter, Alferd Apa, DO  budesonide-formoterol (SYMBICORT) 80-4.5 MCG/ACT inhaler Inhale 2 puffs into the lungs 2 (two) times daily. Brush tongue and rinse mouth with water after each use. 12/13/17   Carollee Herter, Alferd Apa, DO  busPIRone (BUSPAR) 10 MG tablet Take 10 mg by mouth 3 (three) times daily.    [provider]  FLUoxetine (PROZAC) 10 MG capsule Take 3 capsules (30 mg total) by mouth at bedtime. For mood control 04/25/17   Lindell Spar I, NP  gabapentin (NEURONTIN) 300 MG capsule Take 1 capsule (300 mg total) by mouth 3 (three) times daily. For agitation 04/25/17   Lindell Spar I, NP  hydrOXYzine (ATARAX/VISTARIL) 25 MG tablet Take 1 tablet (25 mg total) by mouth every 8 (eight) hours as needed for itching. 12/18/17   Carmin Muskrat, MD  lamoTRIgine (LAMICTAL) 200 MG tablet Take 200 mg by mouth 2 (two) times daily.    [provider]  mirtazapine (REMERON SOL-TAB) 30 MG disintegrating tablet Take 1 tablet (30 mg total) by mouth at bedtime. For depression 04/25/17   Lindell Spar I, NP    omeprazole (PRILOSEC) 40 MG capsule Take 1 capsule (40 mg total) by mouth daily. For acid reflux 04/25/17   Lindell Spar I, NP  ondansetron (ZOFRAN) 4 MG tablet Take 1 tablet (4 mg total) by mouth every 8 (eight) hours as needed for nausea or vomiting. 12/11/17   [name], DO  potassium chloride SA (K-DUR,KLOR-CON) 20 MEQ tablet Take 1 tablet (20 mEq total) by mouth daily. 12/14/17   [name], DO  QUEtiapine (SEROQUEL) 200 MG tablet Take 1 tablet (200 mg total) by mouth at bedtime. For mood control 04/25/17   Encarnacion Slates, NP  risperiDONE (RISPERDAL) 2 MG tablet Take 1 tablet (2 mg total) by mouth at bedtime. For mood control 04/25/17   Lindell Spar I, NP  traZODone (DESYREL) 50 MG tablet Take 1 tablet (50 mg total) by mouth at bedtime. For sleep 04/25/17   Lindell Spar I, NP  amitriptyline (ELAVIL) 25 MG tablet 2 tablets by mouth at bedtime   06/21/11  [provider]  clidinium-chlordiazePOXIDE (LIBRAX) 2.5-5 MG per capsule 2 capsules by mouth every morning and 1 at bedtime   06/21/11  [provider]    Family History Family History  Problem Relation Age of Onset  . Heart disease Father   . Asthma Maternal Grandmother   . Emphysema Maternal Grandfather   . Cancer Maternal Grandfather        Lung Cancer  . Cancer Other        Lung Cancer-Aunt  . Colon cancer Neg Hx   . Esophageal cancer Neg Hx   . Rectal cancer Neg Hx   . Stomach cancer Neg Hx   . Thyroid disease Neg Hx     Social History Social History   Tobacco Use  . Smoking status: Former Smoker    Packs/day: 1.00    Years: 14.00    Pack years: 14.00    Types: Cigarettes  . Smokeless tobacco: Never Used  . Tobacco comment: trying to quit  Substance Use Topics  . Alcohol use: Not Currently    Alcohol/week: 0.0 standard drinks    Comment: rare wine   . Drug use: Not Currently    Types: Marijuana    Comment: once a month     Allergies   Abilify [aripiprazole]; Metoclopramide  hcl; Propoxyphene; Tramadol; Ambien [zolpidem tartrate]; Eszopiclone; Amitriptyline; Metoclopramide; Other; Varenicline; Buprenorphine hcl; Demerol [meperidine]; Emetrol; Morphine and related; and Propoxyphene n-acetaminophen   Review of Systems Review of Systems  Constitutional:       Per HPI, otherwise negative  HENT:       Per HPI, otherwise negative  Respiratory:       Per HPI, otherwise negative  Cardiovascular:       Per HPI, otherwise negative  Gastrointestinal: Negative for vomiting.  Endocrine:       Negative aside from HPI  Genitourinary:       Neg aside from HPI   Musculoskeletal:       Per HPI, otherwise negative  Skin: Negative.   Neurological: Negative for syncope.     Physical Exam Updated Vital Signs BP 125/67 (BP Location: Right Arm)   Pulse 83   Temp 98 F (36.7 C) (Oral)   Resp 15   Ht 5\' 4"  (1.626 m)   Wt 56.7 kg   SpO2 96%   BMI 21.46 kg/m   Physical Exam  Constitutional: She is oriented to person, place, and time. She appears well-developed and well-nourished. No distress.  Uncomfortable appearing female scratching all over  HENT:  Head: Normocephalic and atraumatic.  Eyes: Conjunctivae and EOM are normal.  Cardiovascular: Normal rate and regular rhythm.  Pulmonary/Chest: Effort normal and breath sounds normal. No stridor. No respiratory distress.  Abdominal: She exhibits no distension.  Musculoskeletal: She exhibits no edema.  Neurological: She is alert and oriented to person, place, and time. No cranial nerve deficit.  Skin: Skin is warm and dry.  Multiple areas of excoriation, no appreciable confluent erythema, or urticarial lesions  Psychiatric: She has a normal mood and affect.  Nursing note and vitals reviewed.    ED Treatments /  Results   Procedures Procedures (including critical care time)  Medications Ordered in ED Medications  hydrOXYzine (ATARAX/VISTARIL) tablet 25 mg (25 mg Oral Given 12/18/17 1851)     Initial  Impression / Assessment and Plan / ED Course  I have reviewed the triage vital signs and the nursing notes.  Pertinent labs & imaging results that were available during my care of the patient were reviewed by me and considered in my medical decision making (see chart for details).     7:36 PM Patient presents after possible contact exposure resulting in allergic reaction, with diffuse pruritus. Patient with substantial improvement here, is no evidence for respiratory compromise, was discharged in stable condition.  Final Clinical Impressions(s) / ED Diagnoses   Final diagnoses:  Pruritus    ED Discharge Orders         Ordered    hydrOXYzine (ATARAX/VISTARIL) 25 MG tablet  Every 8 hours PRN     12/18/17 1933           Carmin Muskrat, MD 12/18/17 [phone]

## 2017-12-18 NOTE — Discharge Instructions (Signed)
As discussed, your evaluation today has been largely reassuring.  But, it is important that you monitor your condition carefully, and do not hesitate to return to the ED if you develop new, or concerning changes in your condition. ? ?Otherwise, please follow-up with your physician for appropriate ongoing care. ? ?

## 2017-12-18 NOTE — ED Triage Notes (Addendum)
PT c/o severe itching after painting with [place] today. PT also states she started a new medication 2 days ago Viibryd (vilazodone). PT took 2 benadryl prior to ED arrival.

## 2017-12-18 NOTE — ED Notes (Signed)
Pt is itching bilaterally everywhere. Ankles slightly swollen. Stated itching started 40 mins ago. Took 2 tablets of Benadryl

## 2017-12-20 ENCOUNTER — Encounter: Payer: Self-pay | Admitting: *Deleted

## 2018-01-01 ENCOUNTER — Encounter (HOSPITAL_COMMUNITY): Payer: Self-pay

## 2018-01-01 ENCOUNTER — Other Ambulatory Visit: Payer: Self-pay

## 2018-01-01 ENCOUNTER — Emergency Department (HOSPITAL_COMMUNITY)
Admission: EM | Admit: 2018-01-01 | Discharge: 2018-01-01 | Disposition: A | Discharge status: Home / Self Care | Payer: BLUE CROSS/BLUE SHIELD | Attending: Emergency Medicine | Admitting: Emergency Medicine

## 2018-01-01 DIAGNOSIS — I10 Essential (primary) hypertension: Secondary | ICD-10-CM | POA: Insufficient documentation

## 2018-01-01 DIAGNOSIS — M545 Low back pain, unspecified: Secondary | ICD-10-CM

## 2018-01-01 DIAGNOSIS — Z79899 Other long term (current) drug therapy: Secondary | ICD-10-CM | POA: Diagnosis not present

## 2018-01-01 DIAGNOSIS — R1031 Right lower quadrant pain: Secondary | ICD-10-CM | POA: Diagnosis not present

## 2018-01-01 DIAGNOSIS — E785 Hyperlipidemia, unspecified: Secondary | ICD-10-CM | POA: Diagnosis not present

## 2018-01-01 DIAGNOSIS — Z87891 Personal history of nicotine dependence: Secondary | ICD-10-CM | POA: Insufficient documentation

## 2018-01-01 DIAGNOSIS — R1030 Lower abdominal pain, unspecified: Secondary | ICD-10-CM

## 2018-01-01 DIAGNOSIS — J449 Chronic obstructive pulmonary disease, unspecified: Secondary | ICD-10-CM | POA: Insufficient documentation

## 2018-01-01 DIAGNOSIS — R1032 Left lower quadrant pain: Secondary | ICD-10-CM | POA: Diagnosis not present

## 2018-01-01 LAB — URINALYSIS, ROUTINE W REFLEX MICROSCOPIC
Bilirubin Urine: NEGATIVE
Glucose, UA: NEGATIVE mg/dL
Hgb urine dipstick: NEGATIVE
Ketones, ur: NEGATIVE mg/dL
Nitrite: POSITIVE — AB
Protein, ur: NEGATIVE mg/dL
Specific Gravity, Urine: 1.008 (ref 1.005–1.030)
pH: 6 (ref 5.0–8.0)

## 2018-01-01 LAB — COMPREHENSIVE METABOLIC PANEL
ALT: 13 U/L (ref 0–44)
AST: 15 U/L (ref 15–41)
Albumin: 3.6 g/dL (ref 3.5–5.0)
Alkaline Phosphatase: 87 U/L (ref 38–126)
Anion gap: 6 (ref 5–15)
BUN: <5 mg/dL — ABNORMAL LOW (ref 6–20)
CO2: 26 mmol/L (ref 22–32)
Calcium: 8.9 mg/dL (ref 8.9–10.3)
Chloride: 108 mmol/L (ref 98–111)
Creatinine, Ser: 0.78 mg/dL (ref 0.44–1.00)
GFR calc Af Amer: >60 mL/min (ref >60)
GFR calc non Af Amer: >60 mL/min (ref >60)
Glucose, Bld: 86 mg/dL (ref 70–99)
Potassium: 3.5 mmol/L (ref 3.5–5.1)
Sodium: 140 mmol/L (ref 135–145)
Total Bilirubin: 0.5 mg/dL (ref 0.3–1.2)
Total Protein: 6.7 g/dL (ref 6.5–8.1)

## 2018-01-01 LAB — CBC
HCT: 36.1 % (ref 36.0–46.0)
Hemoglobin: 11.8 g/dL — ABNORMAL LOW (ref 12.0–15.0)
MCH: 31.9 pg (ref 26.0–34.0)
MCHC: 32.7 g/dL (ref 30.0–36.0)
MCV: 97.6 fL (ref 78.0–100.0)
Platelets: 291 10*3/uL (ref 150–400)
RBC: 3.7 MIL/uL — ABNORMAL LOW (ref 3.87–5.11)
RDW: 12.4 % (ref 11.5–15.5)
WBC: 7.9 10*3/uL (ref 4.0–10.5)

## 2018-01-01 LAB — LIPASE, BLOOD: Lipase: 29 U/L (ref 11–51)

## 2018-01-01 MED ORDER — HYDROCODONE-ACETAMINOPHEN 5-325 MG PO TABS
2.0000 | ORAL_TABLET | Freq: Once | ORAL | Status: AC
  Administered 2018-01-01: 2 via ORAL
  Filled 2018-01-01: qty 2

## 2018-01-01 MED ORDER — HYDROCODONE-ACETAMINOPHEN 5-325 MG PO TABS: 1.0000 | ORAL_TABLET | Freq: Four times a day (QID) | ORAL | 0 refills | Status: DC | PRN

## 2018-01-01 MED ORDER — HYDROMORPHONE HCL 1 MG/ML IJ SOLN
1.0000 mg | Freq: Once | INTRAMUSCULAR | Status: AC
  Administered 2018-01-01: 1 mg via INTRAVENOUS
  Filled 2018-01-01: qty 1

## 2018-01-01 MED ORDER — CEPHALEXIN 500 MG PO CAPS: 500.0000 mg | ORAL_CAPSULE | Freq: Four times a day (QID) | ORAL | 0 refills | Status: DC

## 2018-01-01 NOTE — ED Notes (Signed)
Updated on wait time. Blanket given

## 2018-01-01 NOTE — Discharge Instructions (Addendum)
You were evaluated in the emergency department for back and abdominal pain that you feel is related to your interstitial cystitis.  We did some basic blood work that was unremarkable and your urine shows may be some signs of infection.  We are treating you with an antibiotic and some pain medicine and you will need to contact your specialist regarding your stimulator.  Please also follow-up with your regular doctor.

## 2018-01-01 NOTE — ED Triage Notes (Addendum)
Pt reports she has a stimulator in her back that has been cut off since Friday due to issues with stimulator . Stomach began hurting Sunday night.  Pt reports that she has had diarrhea

## 2018-01-01 NOTE — ED Provider Notes (Signed)
[facility] EMERGENCY DEPARTMENT Provider Note   CSN: [id number] Arrival date & time: 01/01/18  1212     History   Chief Complaint Chief Complaint  Patient presents with  . Back Pain  . Abdominal Pain    HPI [name] is a 42 y.o. female.  She presents to the emergency department with complaints of back and abdominal pain that is been going on for 5 or 6 days.  No trauma.  She has a nerve stimulator that she had to turn off because it was not working correctly and her symptoms have been exacerbated by this.  The stimulator is for her interstitial cystitis and she follows with Dr. Amalia Hailey at [facility].  She says her back and abdominal pain symptoms are from her interstitial cystitis and she does not think there is anything different going on.  This is happened before and usually she needs to go on oral pain medicine until she can follow-up with her specialist and get the stimulator working again.  No fevers no chills no chest pain no shortness of breath.  No nausea no vomiting.  She has some diarrhea but she also has irritable bowel and she feels is from that.  No urinary symptoms.  The history is provided by the patient.  Abdominal Pain   This is a recurrent problem. Episode onset: 5 days. The problem occurs constantly. The problem has not changed since onset.Associated with: turning off stimulator. The pain is located in the LLQ, RLQ and suprapubic region. The quality of the pain is sharp. The pain is moderate. Pertinent negatives include fever, dysuria and headaches. Nothing aggravates the symptoms. Nothing relieves the symptoms. Her past medical history is significant for irritable bowel syndrome.    Past Medical History:  Diagnosis Date  . 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 Anal fissure 03/11/2009  . Anemia   . Anxiety   . Anxiety and depression   . ARTHRITIS 08/31/2007  . Arthritis   . Asthma   . 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 Bipolar 1 disorder ([place])   . Bowel  obstruction ([place])   . BRONCHITIS, RECURRENT 08/23/2009   Asthmatic Bronchitis-Dr. Melvyn Novas.....-HFA 75% 12/04/2008>75% 02/05/2009>75% 08/04/2009 -PFT's 01/04/2009 2.56 (86%) ratio 75, no resp to B2 and DLC0 67% > 80 after correction   . Cancer (HCC)    cervical cancer  . Chronic interstitial cystitis 03/11/2009  . Chronic nausea   . Chronic pain   . Colon polyps   . COLONIC POLYPS, HX OF 07/25/2006   ADENOMATOUS POLYP  . COPD (chronic obstructive pulmonary disease) ([place])   . DEPRESSION 08/31/2007  . Endometriosis   . FIBROMYALGIA 08/31/2007  . Fibromyalgia   . GERD 02/05/2009  . Hyperlipidemia   . HYPERTENSION 08/31/2007  . IBS 03/11/2009  . Internal hemorrhoids   . Migraine headache   . 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 PONV (postoperative nausea and vomiting)   . RECTAL BLEEDING 03/11/2009  . Seizures ([place])    been about 1 year since last seisure per pt  . SLEEP APNEA 08/31/2007  . Thyroid disease   . Uterine cyst     Patient Active Problem List   Diagnosis Date Noted  . MDD (major depressive disorder) 04/23/2017  . Substance abuse ([place]) 04/19/2017  . Hyperthyroidism 02/17/2015  . Anxiety 01/28/2015  . Clinical depression 01/28/2015  . COPD exacerbation ([place]) 11/26/2013  . Vaginitis and vulvovaginitis 11/26/2013  . Recurrent pneumonia 02/02/2013  . Left arm pain 01/13/2013  . Left arm pain  01/13/2013    Class: Acute  . Dizziness and giddiness 01/13/2013  . Acute bronchitis 12/07/2012  . Rib pain 12/07/2012  . Insomnia 08/26/2012  . Diarrhea 06/19/2012  . Heme positive stool 06/19/2012  . Bloating 06/19/2012  . Nausea alone 06/19/2012  . Bladder retention 04/24/2012  . Tremor 03/05/2012  . Dysuria 03/05/2012  . Failure to thrive in adult 06/21/2011  . Airway hyperreactivity 03/08/2011  . Back pain, chronic 03/08/2011  . Chronic obstructive pulmonary disease ([place]) 03/08/2011  . Acid reflux 03/08/2011  . Cystitis 01/22/2011  . Bilateral hand numbness 01/13/2011  . WEIGHT  LOSS 06/24/2010  . RIB PAIN, LEFT SIDED 05/04/2010  . ABDOMINAL PAIN, LEFT UPPER QUADRANT 05/04/2010  . Unspecified otitis media 10/26/2009  . BRONCHITIS, RECURRENT 08/23/2009  . URI, ACUTE 08/04/2009  . FATIGUE 07/06/2009  . IBS 03/11/2009  . ANAL FISSURE 03/11/2009  . RECTAL BLEEDING 03/11/2009  . Chronic interstitial cystitis 03/11/2009  . COLONIC POLYPS, HX OF 03/11/2009  . GERD 02/05/2009  . SMOKER 12/04/2008  . chronic asthma poorly controlled 12/04/2008  . 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 Bipolar disorder with depression ([place]) 08/31/2007  . HYPERTENSION 08/31/2007  . EMPHYSEMA 08/31/2007  . 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 ARTHRITIS 08/31/2007  . FIBROMYALGIA 08/31/2007  . SLEEP APNEA 08/31/2007    Past Surgical History:  Procedure Laterality Date  . ABDOMINAL HYSTERECTOMY    . bladder stretching x6    . BLADDER SURGERY     stimulator placed and stretching   . CHOLECYSTECTOMY    . COLONOSCOPY    . COLONOSCOPY WITH PROPOFOL N/A 09/03/2014   Procedure: COLONOSCOPY WITH PROPOFOL;  Surgeon: Milus Banister, MD;  Location: [facility] ENDOSCOPY;  Service: Endoscopy;  Laterality: N/A;  . interstitial cystitis    . PACEMAKER INSERTION     in hip for interstitial cystitis  . removal of uterine cyst and scrapped uterus    . replaced bladder pacemaker       OB History    Gravida  3   Para  2   Term      Preterm      AB  1   Living  2     SAB  1   TAB  0   Ectopic  0   Multiple  0   Live Births  2            Home Medications    Prior to Admission medications   Medication Sig Start Date End Date Taking? Authorizing Provider  albuterol (PROVENTIL HFA;VENTOLIN HFA) 108 (90 Base) MCG/ACT inhaler Inhale 2 puffs into the lungs every 6 (six) hours as needed for wheezing or shortness of breath. 04/25/17   Lindell Spar I, NP  azithromycin (ZITHROMAX Z-PAK) 250 MG tablet As directed 12/11/17   Carollee Herter, Alferd Apa, DO    budesonide-formoterol (SYMBICORT) 80-4.5 MCG/ACT inhaler Inhale 2 puffs into the lungs 2 (two) times daily. Brush tongue and rinse mouth with water after each use. 12/13/17   Carollee Herter, Alferd Apa, DO  busPIRone (BUSPAR) 10 MG tablet Take 10 mg by mouth 3 (three) times daily.    [provider]  FLUoxetine (PROZAC) 10 MG capsule Take 3 capsules (30 mg total) by mouth at bedtime. For mood control 04/25/17   Lindell Spar I, NP  gabapentin (NEURONTIN) 300 MG capsule Take 1 capsule (300 mg total) by mouth 3 (three) times daily. For agitation 04/25/17   Encarnacion Slates, NP  hydrOXYzine (ATARAX/VISTARIL) 25 MG tablet Take 1 tablet (25 mg total) by mouth every 8 (eight) hours as needed for itching. 12/18/17   Carmin Muskrat, MD  lamoTRIgine (LAMICTAL) 200 MG tablet Take 200 mg by mouth 2 (two) times daily.    [provider]  mirtazapine (REMERON SOL-TAB) 30 MG disintegrating tablet Take 1 tablet (30 mg total) by mouth at bedtime. For depression 04/25/17   Lindell Spar I, NP  omeprazole (PRILOSEC) 40 MG capsule Take 1 capsule (40 mg total) by mouth daily. For acid reflux 04/25/17   Lindell Spar I, NP  ondansetron (ZOFRAN) 4 MG tablet Take 1 tablet (4 mg total) by mouth every 8 (eight) hours as needed for nausea or vomiting. 12/11/17   [name], DO  potassium chloride SA (K-DUR,KLOR-CON) 20 MEQ tablet Take 1 tablet (20 mEq total) by mouth daily. 12/14/17   [name], DO  QUEtiapine (SEROQUEL) 200 MG tablet Take 1 tablet (200 mg total) by mouth at bedtime. For mood control 04/25/17   Lindell Spar I, NP  risperiDONE (RISPERDAL) 2 MG tablet Take 1 tablet (2 mg total) by mouth at bedtime. For mood control 04/25/17   Lindell Spar I, NP  traZODone (DESYREL) 50 MG tablet Take 1 tablet (50 mg total) by mouth at bedtime. For sleep 04/25/17   Lindell Spar I, NP  amitriptyline (ELAVIL) 25 MG tablet 2 tablets by mouth at bedtime   06/21/11  [provider]   clidinium-chlordiazePOXIDE (LIBRAX) 2.5-5 MG per capsule 2 capsules by mouth every morning and 1 at bedtime   06/21/11  [provider]    Family History Family History  Problem Relation Age of Onset  . Heart disease Father   . Asthma Maternal Grandmother   . Emphysema Maternal Grandfather   . Cancer Maternal Grandfather        Lung Cancer  . Cancer Other        Lung Cancer-Aunt  . Colon cancer Neg Hx   . Esophageal cancer Neg Hx   . Rectal cancer Neg Hx   . Stomach cancer Neg Hx   . Thyroid disease Neg Hx     Social History Social History   Tobacco Use  . Smoking status: Former Smoker    Packs/day: 1.00    Years: 14.00    Pack years: 14.00    Types: Cigarettes  . Smokeless tobacco: Never Used  . Tobacco comment: trying to quit  Substance Use Topics  . Alcohol use: Not Currently    Alcohol/week: 0.0 standard drinks    Comment: rare wine   . Drug use: Yes    Types: Marijuana    Comment: once a month     Allergies   Abilify [aripiprazole]; Metoclopramide hcl; Propoxyphene; Tramadol; Ambien [zolpidem tartrate]; Eszopiclone; Amitriptyline; Metoclopramide; Other; Varenicline; Buprenorphine hcl; Demerol [meperidine]; Emetrol; Morphine and related; and Propoxyphene n-acetaminophen   Review of Systems Review of Systems  Constitutional: Negative for fever.  HENT: Negative for sore throat.   Eyes: Negative for visual disturbance.  Respiratory: Negative for shortness of breath.   Cardiovascular: Negative for chest pain.  Gastrointestinal: Positive for abdominal pain.  Genitourinary: Negative for dysuria, vaginal bleeding and vaginal discharge.  Musculoskeletal: Positive for back pain.  Skin: Negative for rash.  Neurological: Negative for headaches.     Physical Exam Updated Vital Signs BP 113/76 (BP Location: Left Arm)   Pulse 94   Temp 98.4 F (36.9 C)   Resp 18   Wt  56.7 kg   SpO2 99%   BMI 21.46 kg/m   Physical Exam  Constitutional: She is  oriented to person, place, and time. She appears well-developed and well-nourished. No distress.  HENT:  Head: Normocephalic and atraumatic.  Eyes: Conjunctivae are normal.  Neck: Neck supple.  Cardiovascular: Normal rate, regular rhythm and normal heart sounds.  No murmur heard. Pulmonary/Chest: Effort normal and breath sounds normal. No respiratory distress.  Abdominal: Soft. There is generalized tenderness and tenderness in the right lower quadrant, suprapubic area and left lower quadrant. There is no rigidity and no guarding.  Musculoskeletal: She exhibits no edema, tenderness or deformity.  Neurological: She is alert and oriented to person, place, and time. Gait normal. GCS eye subscore is 4. GCS verbal subscore is 5. GCS motor subscore is 6.  Skin: Skin is warm and dry. Capillary refill takes less than 2 seconds.  Psychiatric: She has a normal mood and affect.  Nursing note and vitals reviewed.    ED Treatments / Results  Labs (all labs ordered are listed, but only abnormal results are displayed) Labs Reviewed  COMPREHENSIVE METABOLIC PANEL - Abnormal; Notable for the following components:      Result Value   BUN <5 (*)    All other components within normal limits  CBC - Abnormal; Notable for the following components:   RBC 3.70 (*)    Hemoglobin 11.8 (*)    All other components within normal limits  URINALYSIS, ROUTINE W REFLEX MICROSCOPIC - Abnormal; Notable for the following components:   APPearance HAZY (*)    Nitrite POSITIVE (*)    Leukocytes, UA MODERATE (*)    Bacteria, UA RARE (*)    All other components within normal limits  LIPASE, BLOOD    EKG None  Radiology No results found.  No orders to display     Procedures Procedures (including critical care time)  Medications Ordered in ED Medications  HYDROmorphone (DILAUDID) injection 1 mg (has no administration in time range)     Initial Impression / Assessment and Plan / ED Course  I have reviewed  the triage vital signs and the nursing notes.  Pertinent labs & imaging results that were available during my care of the patient were reviewed by me and considered in my medical decision making (see chart for details).  Clinical Course as of Jan 03 1215  Tue Jan 01, 8290  2180 42 year old female with low back and low abdominal pain in the setting of malfunctioning her stimulator for her interstitial cystitis.  She says this is all baseline for her when her stimulator is not working.  She does not think anything else different is going on.  We are checking some screening labs and giving her some IV pain medicine.  Likely will discharge on a short course of pain medicine if no other etiology identified   [MB]  1555 Reviewed patient in PMP.  He looks like her last narcotic prescription was filled on the ninth of this month with 90 Tylenol No. 4's.   [MB]  3212 I reviewed the findings in the PMP with the patient.  She says she did fill the Tylenol with codeine but then she was here with an allergic reaction and the thought was it might be that prescription.  She says she is gotten rid of those meds.  I have ordered her to hydrocodone here and barring any abnormal results on her urinalysis she is likely to be discharged.   [MB]  [id number]  Patients urine back and is nitrite positive with 10-20 whites.  She said she would rather go on some antibiotics and will also give her a short course of some pain medicine and she can see her primary care doctor.   [MB]    Clinical Course User Index [MB] Hayden Rasmussen, MD    Final Clinical Impressions(s) / ED Diagnoses   Final diagnoses:  Acute midline low back pain without sciatica  Lower abdominal pain    ED Discharge Orders         Ordered    cephALEXin (KEFLEX) 500 MG capsule  4 times daily     01/01/18 1807    HYDROcodone-acetaminophen (NORCO/VICODIN) 5-325 MG tablet  Every 6 hours PRN     01/01/18 1807           Hayden Rasmussen, MD 01/02/18  1217

## 2018-01-03 DIAGNOSIS — N3010 Interstitial cystitis (chronic) without hematuria: Secondary | ICD-10-CM | POA: Diagnosis not present

## 2018-01-03 DIAGNOSIS — R339 Retention of urine, unspecified: Secondary | ICD-10-CM | POA: Diagnosis not present

## 2018-01-03 DIAGNOSIS — R82998 Other abnormal findings in urine: Secondary | ICD-10-CM | POA: Diagnosis not present

## 2018-01-07 ENCOUNTER — Ambulatory Visit: Payer: BLUE CROSS/BLUE SHIELD | Admitting: Family Medicine

## 2018-01-07 ENCOUNTER — Encounter: Payer: Self-pay | Admitting: Family Medicine

## 2018-01-07 VITALS — BP 114/68 | HR 114 | Temp 98.3°F | Resp 16 | Ht 64.0 in | Wt 121.6 lb

## 2018-01-07 DIAGNOSIS — Z79899 Other long term (current) drug therapy: Secondary | ICD-10-CM | POA: Diagnosis not present

## 2018-01-07 DIAGNOSIS — F419 Anxiety disorder, unspecified: Secondary | ICD-10-CM

## 2018-01-07 DIAGNOSIS — F319 Bipolar disorder, unspecified: Secondary | ICD-10-CM | POA: Diagnosis not present

## 2018-01-07 MED ORDER — CLONAZEPAM 0.5 MG PO TABS: 0.5000 mg | ORAL_TABLET | Freq: Two times a day (BID) | ORAL | 0 refills | Status: DC | PRN

## 2018-01-07 NOTE — Addendum Note (Signed)
Addended by: Harl Bowie on: 01/07/2018 05:14 PM   Modules accepted: Orders

## 2018-01-07 NOTE — Progress Notes (Signed)
Patient ID: [name], female    DOB: [birth year]  Age: 42 y.o. MRN: [id number]    Subjective:  Subjective  HPI [name] presents for severe anxiety/ panic attacks -- the psychiatrist took her off the klonopin and she did well for a while but now it is severe again.  The psych would not put her back on it and she is struggling to function and wants to find a job but can t work with the anxiety/ panic being so bad.     Review of Systems  Constitutional: Negative for chills and fever.  HENT: Negative for congestion and hearing loss.   Eyes: Negative for discharge.  Respiratory: Negative for cough and shortness of breath.   Cardiovascular: Negative for chest pain, palpitations and leg swelling.  Gastrointestinal: Negative for abdominal pain, blood in stool, constipation, diarrhea, nausea and vomiting.  Genitourinary: Negative for dysuria, frequency, hematuria and urgency.  Musculoskeletal: Negative for back pain and myalgias.  Skin: Negative for rash.  Allergic/Immunologic: Negative for environmental allergies.  Neurological: Negative for dizziness, weakness and headaches.  Hematological: Does not bruise/bleed easily.  Psychiatric/Behavioral: Positive for decreased concentration, dysphoric mood and sleep disturbance. Negative for self-injury and suicidal ideas. The patient is nervous/anxious.     History Past Medical History:  Diagnosis Date  . 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 Anal fissure 03/11/2009  . Anemia   . Anxiety   . Anxiety and depression   . ARTHRITIS 08/31/2007  . Arthritis   . Asthma   . 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 Bipolar 1 disorder ([place])   . Bowel obstruction ([place])   . BRONCHITIS, RECURRENT 08/23/2009   Asthmatic Bronchitis-Dr. Melvyn Novas.....-HFA 75% 12/04/2008>75% 02/05/2009>75% 08/04/2009 -PFT's 01/04/2009 2.56 (86%) ratio 75, no resp to B2 and DLC0 67% > 80 after correction   . Cancer (HCC)    cervical cancer  . Chronic interstitial cystitis  03/11/2009  . Chronic nausea   . Chronic pain   . Colon polyps   . COLONIC POLYPS, HX OF 07/25/2006   ADENOMATOUS POLYP  . COPD (chronic obstructive pulmonary disease) ([place])   . DEPRESSION 08/31/2007  . Endometriosis   . FIBROMYALGIA 08/31/2007  . Fibromyalgia   . GERD 02/05/2009  . Hyperlipidemia   . HYPERTENSION 08/31/2007  . IBS 03/11/2009  . Internal hemorrhoids   . Migraine headache   . 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 PONV (postoperative nausea and vomiting)   . RECTAL BLEEDING 03/11/2009  . Seizures ([place])    been about 1 year since last seisure per pt  . SLEEP APNEA 08/31/2007  . Thyroid disease   . Uterine cyst     She has a past surgical history that includes Cholecystectomy; Abdominal hysterectomy; Pacemaker insertion; Bladder surgery; interstitial cystitis; bladder stretching x6; replaced bladder pacemaker; removal of uterine cyst and scrapped uterus; Colonoscopy; and Colonoscopy with propofol (N/A, 09/03/2014).   Her family history includes Asthma in her maternal grandmother; Cancer in her maternal grandfather and other; Emphysema in her maternal grandfather; Heart disease in her father.She reports that she has quit smoking. Her smoking use included cigarettes. She has a 14.00 pack-year smoking history. She has never used smokeless tobacco. She reports that she drank alcohol. She reports that she has current or past drug history. Drug: Marijuana.  Current Outpatient Medications on File Prior to Visit  Medication Sig Dispense Refill  . albuterol (PROVENTIL HFA;VENTOLIN HFA) 108 (90 Base) MCG/ACT inhaler Inhale 2 puffs into the lungs every 6 (six) hours as needed for  wheezing or shortness of breath. 1 Inhaler 0  . cephALEXin (KEFLEX) 500 MG capsule Take 1 capsule (500 mg total) by mouth 4 (four) times daily. 28 capsule 0  . FLUoxetine (PROZAC) 10 MG capsule Take 3 capsules (30 mg total) by mouth at bedtime. For mood control 30 capsule 0  . gabapentin (NEURONTIN) 300 MG capsule Take  1 capsule (300 mg total) by mouth 3 (three) times daily. For agitation 90 capsule 0  . lamoTRIgine (LAMICTAL) 200 MG tablet Take 200 mg by mouth 2 (two) times daily.    . mirtazapine (REMERON SOL-TAB) 30 MG disintegrating tablet Take 1 tablet (30 mg total) by mouth at bedtime. For depression 30 tablet 0  . omeprazole (PRILOSEC) 40 MG capsule Take 1 capsule (40 mg total) by mouth daily. For acid reflux    . ondansetron (ZOFRAN) 4 MG tablet Take 1 tablet (4 mg total) by mouth every 8 (eight) hours as needed for nausea or vomiting. 45 tablet 0  . potassium chloride SA (K-DUR,KLOR-CON) 20 MEQ tablet Take 1 tablet (20 mEq total) by mouth daily. 30 tablet 1  . risperiDONE (RISPERDAL) 2 MG tablet Take 1 tablet (2 mg total) by mouth at bedtime. For mood control 30 tablet 0  . traZODone (DESYREL) 50 MG tablet Take 1 tablet (50 mg total) by mouth at bedtime. For sleep 30 tablet 0  . [DISCONTINUED] amitriptyline (ELAVIL) 25 MG tablet 2 tablets by mouth at bedtime     . [DISCONTINUED] clidinium-chlordiazePOXIDE (LIBRAX) 2.5-5 MG per capsule 2 capsules by mouth every morning and 1 at bedtime      No current facility-administered medications on file prior to visit.      Objective:  Objective  Physical Exam  Constitutional: She is oriented to person, place, and time. She appears well-developed and well-nourished.  HENT:  Head: Normocephalic and atraumatic.  Eyes: Conjunctivae and EOM are normal.  Neck: Normal range of motion. Neck supple. No JVD present. Carotid bruit is not present. No thyromegaly present.  Cardiovascular: Normal rate, regular rhythm and normal heart sounds.  No murmur heard. Pulmonary/Chest: Effort normal and breath sounds normal. No respiratory distress. She has no wheezes. She has no rales. She exhibits no tenderness.  Musculoskeletal: She exhibits no edema.  Neurological: She is alert and oriented to person, place, and time.  Psychiatric: Her speech is normal and behavior is normal.  Judgment and thought content normal. Her mood appears anxious. Cognition and memory are normal. She exhibits a depressed mood.  Nursing note and vitals reviewed.  BP 114/68 (BP Location: Right Arm, Cuff Size: Normal)   Pulse (!) 114   Temp 98.3 F (36.8 C) (Oral)   Resp 16   Ht 5\' 4"  (1.626 m)   Wt 121 lb 9.6 oz (55.2 kg)   SpO2 99%   BMI 20.87 kg/m  Wt Readings from Last 3 Encounters:  01/07/18 121 lb 9.6 oz (55.2 kg)  01/01/18 125 lb (56.7 kg)  12/18/17 125 lb (56.7 kg)     Lab Results  Component Value Date   WBC 7.9 01/01/2018   HGB 11.8 (L) 01/01/2018   HCT 36.1 01/01/2018   PLT 291 01/01/2018   GLUCOSE 86 01/01/2018   CHOL 143 04/24/2017   TRIG 85 04/24/2017   HDL 49 04/24/2017   LDLCALC 77 04/24/2017   ALT 13 01/01/2018   AST 15 01/01/2018   NA 140 01/01/2018   K 3.5 01/01/2018   CL 108 01/01/2018   CREATININE 0.78 01/01/2018  BUN <5 (L) 01/01/2018   CO2 26 01/01/2018   TSH 1.10 12/11/2017   HGBA1C 5.0 04/24/2017    No results found.   Assessment & Plan:  Plan  I have discontinued Shandel R. Zalesky's QUEtiapine, busPIRone, azithromycin, budesonide-formoterol, hydrOXYzine, and HYDROcodone-acetaminophen. I am also having her start on clonazePAM. Additionally, I am having her maintain her albuterol, FLUoxetine, gabapentin, omeprazole, risperiDONE, traZODone, mirtazapine, lamoTRIgine, ondansetron, potassium chloride SA, and cephALEXin.  Meds ordered this encounter  Medications  . clonazePAM (KLONOPIN) 0.5 MG tablet    Sig: Take 1 tablet (0.5 mg total) by mouth 2 (two) times daily as needed for anxiety.    Dispense:  60 tablet    Refill:  0    Problem List Items Addressed This Visit      Unprioritized   Anxiety - Primary    Add klonopin Pt to find new psych but will keep going for now  Panic attacks are severe      Relevant Medications   clonazePAM (KLONOPIN) 0.5 MG tablet   Bipolar disorder with depression ([place])    F/u psych con't meds           Follow-up: Return if symptoms worsen or fail to improve.  [name], DO

## 2018-01-07 NOTE — Assessment & Plan Note (Signed)
Add klonopin Pt to find new psych but will keep going for now  Panic attacks are severe

## 2018-01-07 NOTE — Patient Instructions (Signed)
Panic Attack A panic attack is a sudden episode of severe anxiety, fear, or discomfort that causes physical and emotional symptoms. The attack may be in response to something frightening, or it may occur for no known reason. Symptoms of a panic attack can be similar to symptoms of a heart attack or stroke. It is important to see your health care provider when you have a panic attack so that these conditions can be ruled out. A panic attack is a symptom of another condition. Most panic attacks go away with treatment of the underlying problem. If you have panic attacks often, you may have a condition called panic disorder. What are the causes? A panic attack may be caused by:  An extreme, life-threatening situation, such as a war or natural disaster.  An anxiety disorder, such as post-traumatic stress disorder.  Depression.  Certain medical conditions, including heart problems, neurological conditions, and infections.  Certain over-the-counter and prescription medicines.  Illegal drugs that increase heart rate and blood pressure, such as methamphetamine.  Alcohol.  Supplements that increase anxiety.  Panic disorder.  What increases the risk? You are more likely to develop this condition if:  You have an anxiety disorder.  You have another mental health condition.  You take certain medicines.  You use alcohol, illegal drugs, or other substances.  You are under extreme stress.  A life event is causing increased feelings of anxiety and depression.  What are the signs or symptoms? A panic attack starts suddenly, usually lasts about 20 minutes, and occurs with one or more of the following:  A pounding heart.  A feeling that your heart is beating irregularly or faster than normal (palpitations).  Sweating.  Trembling or shaking.  Shortness of breath or feeling smothered.  Feeling choked.  Chest pain or discomfort.  Nausea or a strange feeling in your  stomach.  Dizziness, feeling lightheaded, or feeling like you might faint.  Chills or hot flashes.  Numbness or tingling in your lips, hands, or feet.  Feeling confused, or feeling that you are not yourself.  Fear of losing control or being emotionally unstable.  Fear of dying.  How is this diagnosed? A panic attack is diagnosed with an assessment by your health care provider. During the assessment your health care provider will ask questions about:  Your history of anxiety, depression, and panic attacks.  Your medical history.  Whether you drink alcohol, use illegal drugs, take supplements, or take medicines. Be honest about your substance use.  Your health care provider may also:  Order blood tests or other kinds of tests to rule out serious medical conditions.  Refer you to a mental health professional for further evaluation.  How is this treated? Treatment depends on the cause of the panic attack:  If the cause is a medical problem, your health care provider will either treat that problem or refer you to a specialist.  If the cause is emotional, you may be given anti-anxiety medicines or referred to a counselor. These medicines may reduce how often attacks happen, reduce how severe the attacks are, and lower anxiety.  If the cause is a medicine, your health care provider may tell you to stop the medicine, change your dose, or take a different medicine.  If the cause is a drug, treatment may involve letting the drug wear off and taking medicine to help the drug leave your body or to counteract its effects. Attacks caused by drug abuse may continue even if you stop using   the drug.  Follow these instructions at home:  Take over-the-counter and prescription medicines only as told by your health care provider.  If you feel anxious, limit your caffeine intake.  Take good care of your physical and mental health by: ? Eating a balanced diet that includes plenty of fresh  fruits and vegetables, whole grains, lean meats, and low-fat dairy. ? Getting plenty of rest. Try to get 7-8 hours of uninterrupted sleep each night. ? Exercising regularly. Try to get 30 minutes of physical activity at least 5 days a week. ? Not smoking. Talk to your health care provider if you need help quitting. ? Limiting alcohol intake to no more than 1 drink a day for nonpregnant women and 2 drinks a day for men. One drink equals 12 oz of beer, 5 oz of wine, or 1 oz of hard liquor.  Keep all follow-up visits as told by your health care provider. This is important. Panic attacks may have underlying physical or emotional problems that take time to accurately diagnose. Contact a health care provider if:  Your symptoms do not improve, or they get worse.  You are not able to take your medicine as prescribed because of side effects. Get help right away if:  You have serious thoughts about hurting yourself or others.  You have symptoms of a panic attack. Do not drive yourself to the hospital. Have someone else drive you or call an ambulance. If you ever feel like you may hurt yourself or others, or you have thoughts about taking your own life, get help right away. You can go to your nearest emergency department or call:  Your local emergency services (911 in the [place]  A suicide crisis helpline, such as the National Suicide Prevention Lifeline at 1-800-273-8255. This is open 24 hours a day.  Summary  A panic attack is a sign of a serious health or mental health condition. Get help right away. Do not drive yourself to the hospital. Have someone else drive you or call an ambulance.  Always see a health care provider to have the reasons for the panic attack correctly diagnosed.  If your panic attack was caused by a physical problem, follow your health care provider's suggestions for medicine, referral to a specialist, and lifestyle changes.  If your panic attack was caused by an  emotional problem, follow through with counseling from a qualified mental health specialist.  If you feel like you may hurt yourself or others, call 911 and get help right away. This information is not intended to replace advice given to you by your health care provider. Make sure you discuss any questions you have with your health care provider. Document Released: 03/27/2005 Document Revised: 05/05/2016 Document Reviewed: 05/05/2016 Elsevier Interactive Patient Education  2018 [employer] Inc[employer]  

## 2018-01-07 NOTE — Assessment & Plan Note (Signed)
F/u psych con't meds

## 2018-01-09 LAB — PAIN MGMT, PROFILE 8 W/CONF, U
6 Acetylmorphine: NEGATIVE ng/mL (ref <10)
Alcohol Metabolites: NEGATIVE ng/mL (ref <500)
Amphetamines: NEGATIVE ng/mL (ref <500)
Benzodiazepines: NEGATIVE ng/mL (ref <100)
Buprenorphine, Urine: NEGATIVE ng/mL (ref <5)
Cocaine Metabolite: NEGATIVE ng/mL (ref <150)
Creatinine: 69.1 mg/dL
MDMA: NEGATIVE ng/mL (ref <500)
Marijuana Metabolite: 18 ng/mL — ABNORMAL HIGH (ref <5)
Marijuana Metabolite: POSITIVE ng/mL — AB (ref <20)
Opiates: NEGATIVE ng/mL (ref <100)
Oxidant: NEGATIVE ug/mL (ref <200)
Oxycodone: NEGATIVE ng/mL (ref <100)
pH: 6.79 (ref 4.5–9.0)

## 2018-01-11 DIAGNOSIS — F332 Major depressive disorder, recurrent severe without psychotic features: Secondary | ICD-10-CM | POA: Diagnosis not present

## 2018-01-11 DIAGNOSIS — F411 Generalized anxiety disorder: Secondary | ICD-10-CM | POA: Diagnosis not present

## 2018-01-11 DIAGNOSIS — F314 Bipolar disorder, current episode depressed, severe, without psychotic features: Secondary | ICD-10-CM | POA: Diagnosis not present

## 2018-01-14 ENCOUNTER — Other Ambulatory Visit: Payer: Self-pay

## 2018-01-17 ENCOUNTER — Encounter (HOSPITAL_BASED_OUTPATIENT_CLINIC_OR_DEPARTMENT_OTHER): Payer: Self-pay | Admitting: Emergency Medicine

## 2018-01-17 ENCOUNTER — Ambulatory Visit: Payer: Self-pay

## 2018-01-17 ENCOUNTER — Emergency Department (HOSPITAL_BASED_OUTPATIENT_CLINIC_OR_DEPARTMENT_OTHER): Payer: BLUE CROSS/BLUE SHIELD

## 2018-01-17 ENCOUNTER — Other Ambulatory Visit: Payer: Self-pay

## 2018-01-17 ENCOUNTER — Emergency Department (HOSPITAL_BASED_OUTPATIENT_CLINIC_OR_DEPARTMENT_OTHER)
Admission: EM | Admit: 2018-01-17 | Discharge: 2018-01-17 | Disposition: A | Discharge status: Home / Self Care | Payer: BLUE CROSS/BLUE SHIELD | Attending: Emergency Medicine | Admitting: Emergency Medicine

## 2018-01-17 DIAGNOSIS — Z960 Presence of urogenital implants: Secondary | ICD-10-CM | POA: Insufficient documentation

## 2018-01-17 DIAGNOSIS — R296 Repeated falls: Secondary | ICD-10-CM | POA: Insufficient documentation

## 2018-01-17 DIAGNOSIS — M79651 Pain in right thigh: Secondary | ICD-10-CM | POA: Insufficient documentation

## 2018-01-17 DIAGNOSIS — R1031 Right lower quadrant pain: Secondary | ICD-10-CM | POA: Diagnosis not present

## 2018-01-17 DIAGNOSIS — Z79899 Other long term (current) drug therapy: Secondary | ICD-10-CM | POA: Diagnosis not present

## 2018-01-17 DIAGNOSIS — M79652 Pain in left thigh: Secondary | ICD-10-CM | POA: Insufficient documentation

## 2018-01-17 DIAGNOSIS — I10 Essential (primary) hypertension: Secondary | ICD-10-CM | POA: Insufficient documentation

## 2018-01-17 DIAGNOSIS — J45909 Unspecified asthma, uncomplicated: Secondary | ICD-10-CM | POA: Diagnosis not present

## 2018-01-17 DIAGNOSIS — R1030 Lower abdominal pain, unspecified: Secondary | ICD-10-CM | POA: Insufficient documentation

## 2018-01-17 DIAGNOSIS — Z8541 Personal history of malignant neoplasm of cervix uteri: Secondary | ICD-10-CM | POA: Insufficient documentation

## 2018-01-17 DIAGNOSIS — R109 Unspecified abdominal pain: Secondary | ICD-10-CM | POA: Diagnosis not present

## 2018-01-17 DIAGNOSIS — F17210 Nicotine dependence, cigarettes, uncomplicated: Secondary | ICD-10-CM | POA: Diagnosis not present

## 2018-01-17 DIAGNOSIS — H538 Other visual disturbances: Secondary | ICD-10-CM | POA: Diagnosis not present

## 2018-01-17 DIAGNOSIS — R1032 Left lower quadrant pain: Secondary | ICD-10-CM | POA: Diagnosis not present

## 2018-01-17 HISTORY — DX: Other psychoactive substance abuse, uncomplicated: F19.10

## 2018-01-17 LAB — CK: Total CK: 79 U/L (ref 38–234)

## 2018-01-17 LAB — URINALYSIS, ROUTINE W REFLEX MICROSCOPIC
Bilirubin Urine: NEGATIVE
Glucose, UA: NEGATIVE mg/dL
Hgb urine dipstick: NEGATIVE
Ketones, ur: NEGATIVE mg/dL
Nitrite: NEGATIVE
Protein, ur: NEGATIVE mg/dL
Specific Gravity, Urine: <1.005 — ABNORMAL LOW (ref 1.005–1.030)
pH: 6 (ref 5.0–8.0)

## 2018-01-17 LAB — COMPREHENSIVE METABOLIC PANEL
ALT: 17 U/L (ref 0–44)
AST: 16 U/L (ref 15–41)
Albumin: 3.9 g/dL (ref 3.5–5.0)
Alkaline Phosphatase: 99 U/L (ref 38–126)
Anion gap: 9 (ref 5–15)
BUN: 7 mg/dL (ref 6–20)
CO2: 29 mmol/L (ref 22–32)
Calcium: 9.3 mg/dL (ref 8.9–10.3)
Chloride: 101 mmol/L (ref 98–111)
Creatinine, Ser: 0.86 mg/dL (ref 0.44–1.00)
GFR calc Af Amer: >60 mL/min (ref >60)
GFR calc non Af Amer: >60 mL/min (ref >60)
Glucose, Bld: 98 mg/dL (ref 70–99)
Potassium: 3.4 mmol/L — ABNORMAL LOW (ref 3.5–5.1)
Sodium: 139 mmol/L (ref 135–145)
Total Bilirubin: 0.4 mg/dL (ref 0.3–1.2)
Total Protein: 7.4 g/dL (ref 6.5–8.1)

## 2018-01-17 LAB — URINALYSIS, MICROSCOPIC (REFLEX): RBC / HPF: NONE SEEN RBC/hpf (ref 0–5)

## 2018-01-17 LAB — CBC
HCT: 42.6 % (ref 36.0–46.0)
Hemoglobin: 13.6 g/dL (ref 12.0–15.0)
MCH: 31.3 pg (ref 26.0–34.0)
MCHC: 31.9 g/dL (ref 30.0–36.0)
MCV: 97.9 fL (ref 80.0–100.0)
Platelets: 284 10*3/uL (ref 150–400)
RBC: 4.35 MIL/uL (ref 3.87–5.11)
RDW: 11.7 % (ref 11.5–15.5)
WBC: 10.2 10*3/uL (ref 4.0–10.5)
nRBC: 0 % (ref 0.0–0.2)

## 2018-01-17 LAB — LIPASE, BLOOD: Lipase: 26 U/L (ref 11–51)

## 2018-01-17 MED ORDER — HYDROMORPHONE HCL 1 MG/ML IJ SOLN
0.5000 mg | Freq: Once | INTRAMUSCULAR | Status: AC
  Administered 2018-01-17: 0.5 mg via INTRAVENOUS
  Filled 2018-01-17: qty 1

## 2018-01-17 MED ORDER — DICYCLOMINE HCL 20 MG PO TABS: 20.0000 mg | ORAL_TABLET | Freq: Two times a day (BID) | ORAL | 0 refills | Status: DC

## 2018-01-17 MED ORDER — ONDANSETRON HCL 4 MG/2ML IJ SOLN
4.0000 mg | Freq: Once | INTRAMUSCULAR | Status: AC
  Administered 2018-01-17: 4 mg via INTRAVENOUS
  Filled 2018-01-17: qty 2

## 2018-01-17 MED ORDER — POLYETHYLENE GLYCOL 3350 17 GM/SCOOP PO POWD: 17.0000 g | Freq: Every day | ORAL | 0 refills | Status: DC

## 2018-01-17 MED ORDER — IOPAMIDOL (ISOVUE-300) INJECTION 61%
100.0000 mL | Freq: Once | INTRAVENOUS | Status: AC | PRN
  Administered 2018-01-17: 100 mL via INTRAVENOUS

## 2018-01-17 NOTE — ED Provider Notes (Signed)
[facility] Provider Note   CSN: [id number] Arrival date & time: 01/17/18  1802     History   Chief Complaint Chief Complaint  Patient presents with  . Abdominal Pain    HPI [name] is a 42 y.o. female.  Patient with history of cholecystectomy, hysterectomy presents the emergency department today with bilateral lower abdominal pain.  Patient also has a history of opioid and benzodiazepine abuse.  Patient states that she has had 3 days of lower abdominal pain bilaterally.  Pain goes into her bilateral thighs since he states that she has had several falls.  She denies fever, chest pain, shortness of breath, nausea, vomiting, diarrhea.  She denies any urinary symptoms.  She denies lower back pain.  She states that her vision has been blurry recently.  She has been taking Tylenol without any relief. The onset of this condition was acute. The course is constant. Aggravating factors: palpation. Alleviating factors: none.       Past Medical History:  Diagnosis Date  . 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 Anal fissure 03/11/2009  . Anemia   . Anxiety   . Anxiety and depression   . ARTHRITIS 08/31/2007  . Arthritis   . Asthma   . 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 Bipolar 1 disorder ([place])   . Bowel obstruction ([place])   . BRONCHITIS, RECURRENT 08/23/2009   Asthmatic Bronchitis-Dr. Melvyn Novas.....-HFA 75% 12/04/2008>75% 02/05/2009>75% 08/04/2009 -PFT's 01/04/2009 2.56 (86%) ratio 75, no resp to B2 and DLC0 67% > 80 after correction   . Cancer (HCC)    cervical cancer  . Chronic interstitial cystitis 03/11/2009  . Chronic nausea   . Chronic pain   . Colon polyps   . COLONIC POLYPS, HX OF 07/25/2006   ADENOMATOUS POLYP  . COPD (chronic obstructive pulmonary disease) ([place])   . DEPRESSION 08/31/2007  . Endometriosis   . FIBROMYALGIA 08/31/2007  . Fibromyalgia   . GERD 02/05/2009  . Hyperlipidemia   . HYPERTENSION 08/31/2007  . IBS 03/11/2009  .  Internal hemorrhoids   . Migraine headache   . 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 PONV (postoperative nausea and vomiting)   . RECTAL BLEEDING 03/11/2009  . Seizures ([place])    been about 1 year since last seisure per pt  . SLEEP APNEA 08/31/2007  . Substance abuse ([place])   . Thyroid disease   . Uterine cyst     Patient Active Problem List   Diagnosis Date Noted  . MDD (major depressive disorder) 04/23/2017  . Substance abuse ([place]) 04/19/2017  . Hyperthyroidism 02/17/2015  . Anxiety 01/28/2015  . Clinical depression 01/28/2015  . COPD exacerbation ([place]) 11/26/2013  . Vaginitis and vulvovaginitis 11/26/2013  . Recurrent pneumonia 02/02/2013  . Left arm pain 01/13/2013  . Left arm pain 01/13/2013    Class: Acute  . Dizziness and giddiness 01/13/2013  . Acute bronchitis 12/07/2012  . Rib pain 12/07/2012  . Insomnia 08/26/2012  . Diarrhea 06/19/2012  . Heme positive stool 06/19/2012  . Bloating 06/19/2012  . Nausea alone 06/19/2012  . Bladder retention 04/24/2012  . Tremor 03/05/2012  . Dysuria 03/05/2012  . Failure to thrive in adult 06/21/2011  . Airway hyperreactivity 03/08/2011  . Back pain, chronic 03/08/2011  . Chronic obstructive pulmonary disease ([place]) 03/08/2011  . Acid reflux 03/08/2011  . Cystitis 01/22/2011  . Bilateral hand numbness 01/13/2011  . WEIGHT LOSS 06/24/2010  . RIB PAIN, LEFT SIDED 05/04/2010  . ABDOMINAL PAIN, LEFT UPPER QUADRANT 05/04/2010  .  Unspecified otitis media 10/26/2009  . BRONCHITIS, RECURRENT 08/23/2009  . URI, ACUTE 08/04/2009  . FATIGUE 07/06/2009  . IBS 03/11/2009  . ANAL FISSURE 03/11/2009  . RECTAL BLEEDING 03/11/2009  . Chronic interstitial cystitis 03/11/2009  . COLONIC POLYPS, HX OF 03/11/2009  . GERD 02/05/2009  . SMOKER 12/04/2008  . chronic asthma poorly controlled 12/04/2008  . 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 Bipolar disorder with depression ([place]) 08/31/2007  . HYPERTENSION  08/31/2007  . EMPHYSEMA 08/31/2007  . 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 ARTHRITIS 08/31/2007  . FIBROMYALGIA 08/31/2007  . SLEEP APNEA 08/31/2007    Past Surgical History:  Procedure Laterality Date  . ABDOMINAL HYSTERECTOMY    . bladder stretching x6    . BLADDER SURGERY     stimulator placed and stretching   . CHOLECYSTECTOMY    . COLONOSCOPY    . COLONOSCOPY WITH PROPOFOL N/A 09/03/2014   Procedure: COLONOSCOPY WITH PROPOFOL;  Surgeon: Milus Banister, MD;  Location: [facility];  Service: Endoscopy;  Laterality: N/A;  . interstitial cystitis    . PACEMAKER INSERTION     in hip for interstitial cystitis  . removal of uterine cyst and scrapped uterus    . replaced bladder pacemaker       OB History    Gravida  3   Para  2   Term      Preterm      AB  1   Living  2     SAB  1   TAB  0   Ectopic  0   Multiple  0   Live Births  2            Home Medications    Prior to Admission medications   Medication Sig Start Date End Date Taking? Authorizing Provider  albuterol (PROVENTIL HFA;VENTOLIN HFA) 108 (90 Base) MCG/ACT inhaler Inhale 2 puffs into the lungs every 6 (six) hours as needed for wheezing or shortness of breath. 04/25/17   Lindell Spar I, NP  cephALEXin (KEFLEX) 500 MG capsule Take 1 capsule (500 mg total) by mouth 4 (four) times daily. 01/01/18   Hayden Rasmussen, MD  clonazePAM (KLONOPIN) 0.5 MG tablet Take 1 tablet (0.5 mg total) by mouth 2 (two) times daily as needed for anxiety. 01/07/18   [name], DO  FLUoxetine (PROZAC) 10 MG capsule Take 3 capsules (30 mg total) by mouth at bedtime. For mood control 04/25/17   Lindell Spar I, NP  gabapentin (NEURONTIN) 300 MG capsule Take 1 capsule (300 mg total) by mouth 3 (three) times daily. For agitation 04/25/17   Lindell Spar I, NP  lamoTRIgine (LAMICTAL) 200 MG tablet Take 200 mg by mouth 2 (two) times daily.    [provider]  mirtazapine (REMERON SOL-TAB) 30 MG disintegrating  tablet Take 1 tablet (30 mg total) by mouth at bedtime. For depression 04/25/17   Lindell Spar I, NP  omeprazole (PRILOSEC) 40 MG capsule Take 1 capsule (40 mg total) by mouth daily. For acid reflux 04/25/17   Lindell Spar I, NP  ondansetron (ZOFRAN) 4 MG tablet Take 1 tablet (4 mg total) by mouth every 8 (eight) hours as needed for nausea or vomiting. 12/11/17   [name], DO  potassium chloride SA (K-DUR,KLOR-CON) 20 MEQ tablet Take 1 tablet (20 mEq total) by mouth daily. 12/14/17   Roma Schanz R, DO  risperiDONE (RISPERDAL) 2 MG tablet Take 1 tablet (2 mg  total) by mouth at bedtime. For mood control 04/25/17   Lindell Spar I, NP  traZODone (DESYREL) 50 MG tablet Take 1 tablet (50 mg total) by mouth at bedtime. For sleep 04/25/17   Lindell Spar I, NP  amitriptyline (ELAVIL) 25 MG tablet 2 tablets by mouth at bedtime   06/21/11  [provider]  clidinium-chlordiazePOXIDE (LIBRAX) 2.5-5 MG per capsule 2 capsules by mouth every morning and 1 at bedtime   06/21/11  [provider]    Family History Family History  Problem Relation Age of Onset  . Heart disease Father   . Asthma Maternal Grandmother   . Emphysema Maternal Grandfather   . Cancer Maternal Grandfather        Lung Cancer  . Cancer Other        Lung Cancer-Aunt  . Colon cancer Neg Hx   . Esophageal cancer Neg Hx   . Rectal cancer Neg Hx   . Stomach cancer Neg Hx   . Thyroid disease Neg Hx     Social History Social History   Tobacco Use  . Smoking status: Current Every Day Smoker    Packs/day: 1.00    Years: 14.00    Pack years: 14.00    Types: Cigarettes  . Smokeless tobacco: Never Used  . Tobacco comment: trying to quit  Substance Use Topics  . Alcohol use: Not Currently    Alcohol/week: 0.0 standard drinks    Comment: rare wine   . Drug use: Yes    Types: Marijuana    Comment: once a month     Allergies   Abilify [aripiprazole]; Metoclopramide hcl; Propoxyphene; Tramadol; Ambien  [zolpidem tartrate]; Eszopiclone; Amitriptyline; Metoclopramide; Other; Varenicline; Buprenorphine hcl; Demerol [meperidine]; Emetrol; Morphine and related; and Propoxyphene n-acetaminophen   Review of Systems Review of Systems  Constitutional: Negative for fever.  HENT: Negative for rhinorrhea and sore throat.   Eyes: Negative for redness.  Respiratory: Negative for cough.   Cardiovascular: Negative for chest pain.  Gastrointestinal: Positive for abdominal pain. Negative for diarrhea, nausea and vomiting.  Genitourinary: Negative for dysuria.  Musculoskeletal: Negative for myalgias.  Skin: Negative for rash.  Neurological: Negative for headaches.     Physical Exam Updated Vital Signs BP 102/81 (BP Location: Right Arm)   Pulse 85   Temp 98.6 F (37 C) (Oral)   Resp 18   Ht 5\' 4"  (1.626 m)   Wt 55.3 kg   SpO2 96%   BMI 20.94 kg/m   Physical Exam  Constitutional: She appears well-developed and well-nourished.  HENT:  Head: Normocephalic and atraumatic.  Eyes: Conjunctivae are normal. Right eye exhibits no discharge. Left eye exhibits no discharge.  Neck: Normal range of motion. Neck supple.  Cardiovascular: Normal rate, regular rhythm and normal heart sounds.  Pulmonary/Chest: Effort normal and breath sounds normal.  Abdominal: Soft. There is tenderness in the right lower quadrant, suprapubic area and left lower quadrant. There is no rebound, no guarding and no CVA tenderness.  Musculoskeletal:  No swelling or tenderness over the bilateral thighs.  Patient has full range of motion of the legs with some mild discomfort.  Neurological: She is alert.  Skin: Skin is warm and dry.  Psychiatric: She has a normal mood and affect.  Nursing note and vitals reviewed.    ED Treatments / Results  Labs (all labs ordered are listed, but only abnormal results are displayed) Labs Reviewed  URINALYSIS, ROUTINE W REFLEX MICROSCOPIC - Abnormal; Notable for the following components:  Result Value   Specific Gravity, Urine <1.005 (*)    Leukocytes, UA SMALL (*)    All other components within normal limits  URINALYSIS, MICROSCOPIC (REFLEX) - Abnormal; Notable for the following components:   Bacteria, UA RARE (*)    All other components within normal limits  COMPREHENSIVE METABOLIC PANEL - Abnormal; Notable for the following components:   Potassium 3.4 (*)    All other components within normal limits  LIPASE, BLOOD  CBC  CK    EKG None  Radiology No results found.  Procedures Procedures (including critical care time)  Medications Ordered in ED Medications  HYDROmorphone (DILAUDID) injection 0.5 mg (0.5 mg Intravenous Given 01/17/18 2018)  ondansetron (ZOFRAN) injection 4 mg (4 mg Intravenous Given 01/17/18 2017)  iopamidol (ISOVUE-300) 61 % injection 100 mL (100 mLs Intravenous Contrast Given 01/17/18 2023)     Initial Impression / Assessment and Plan / ED Course  I have reviewed the triage vital signs and the nursing notes.  Pertinent labs & imaging results that were available during my care of the patient were reviewed by me and considered in my medical decision making (see chart for details).     Patient seen and examined.  Medications and imaging ordered.  Will follow lab work.  Vital signs reviewed and are as follows: BP 102/81 (BP Location: Right Arm)   Pulse 85   Temp 98.6 F (37 C) (Oral)   Resp 18   Ht 5\' 4"  (1.626 m)   Wt 55.3 kg   SpO2 96%   BMI 20.94 kg/m   Lab work-up is negative.  Awaiting results of CT imaging of the abdomen and pelvis.  9:58 PM Work-up reassuring. Pt updated.  Slight desaturation after Dilaudid.  Improved with conversation.  Patient is awake and alert and in no respiratory distress.  Home with MiraLAX and Bentyl.  Patient requested stronger pain medication, I declined.  Encouraged her to call her doctor tomorrow for follow-up appointment.  The patient was urged to return to the Emergency Department  immediately with worsening of current symptoms, worsening abdominal pain, persistent vomiting, blood noted in stools, fever, or any other concerns. The patient verbalized understanding.    Final Clinical Impressions(s) / ED Diagnoses   Final diagnoses:  Lower abdominal pain   Patient with abdominal pain. Vitals are stable, no fever. Labs reassuring. Imaging essentially negative. No signs of dehydration, patient is tolerating PO's. Lungs are clear and no signs suggestive of PNA. Low concern for appendicitis, cholecystitis, pancreatitis, ruptured viscus, UTI, kidney stone, aortic dissection, aortic aneurysm or other emergent abdominal etiology. Supportive therapy indicated with return if symptoms worsen. She has pain in legs, reported weakness and falls. This seems somewhat chronic.  No focal weakness on exam or signs of stroke.   ED Discharge Orders         Ordered    dicyclomine (BENTYL) 20 MG tablet  2 times daily     01/17/18 2154    polyethylene glycol powder (GLYCOLAX/MIRALAX) powder  Daily     01/17/18 2154           Carlisle Cater, PA-C 01/17/18 2200    Gareth Morgan, MD 01/18/18 1245

## 2018-01-17 NOTE — ED Notes (Signed)
Pt and family verbalize understanding of dc instructions and deny any further needs at this time 

## 2018-01-17 NOTE — ED Notes (Signed)
Pt requesting more pain medication. Pt made aware it has not been very long since last dose of dilaudid was given. Geiple PA made aware. No new orders aware.

## 2018-01-17 NOTE — ED Notes (Signed)
Patient transported to CT 

## 2018-01-17 NOTE — Telephone Encounter (Signed)
Pt called with C/O dizziness and feeling faint.  She says she has fallen and its been hard to get back up. She said this started yesterday. She states she is able to walk but her legs are real weak and the top of her legs and abdomin ache. She says she finds it difficult to see the TV because her vision is blurred. She denies weakness on one side of her body. She denies headache.She has not had a recent cold and is unable to say what has caused these symptoms. Per protocol pt will go to the ED.  She states she has a driver. Care instructions read to patient. Patient verbalized understanding of all instructions.  Reason for Disposition . SEVERE dizziness (e.g., unable to stand, requires support to walk, feels like passing out now)  Answer Assessment - Initial Assessment Questions 1. DESCRIPTION: "Describe your dizziness."     Like im going to pass out 2. LIGHTHEADED: "Do you feel lightheaded?" (e.g., somewhat faint, woozy, weak upon standing)     faint 3. VERTIGO: "Do you feel like either you or the room is spinning or tilting?" (i.e. vertigo)     no 4. SEVERITY: "How bad is it?"  "Do you feel like you are going to faint?" "Can you stand and walk?"   - MILD - walking normally   - MODERATE - interferes with normal activities (e.g., work, school)    - SEVERE - unable to stand, requires support to walk, feels like passing out now.      moderate 5. ONSET:  "When did the dizziness begin?"   yesterday 6. AGGRAVATING FACTORS: "Does anything make it worse?" (e.g., standing, change in head position)     Side to side blurr vision hard to focus on tV 7. HEART RATE: "Can you tell me your heart rate?" "How many beats in 15 seconds?"  (Note: not all patients can do this)       no 8. CAUSE: "What do you think is causing the dizziness?"     no 9. RECURRENT SYMPTOM: "Have you had dizziness before?" If so, ask: "When was the last time?" "What happened that time?"     no 10. OTHER SYMPTOMS: "Do you have any  other symptoms?" (e.g., fever, chest pain, vomiting, diarrhea, bleeding)       Weakness in legs and achy legs abdomin is sore 11. PREGNANCY: "Is there any chance you are pregnant?" "When was your last menstrual period?"       histerectomy  Protocols used: DIZZINESS [facility]

## 2018-01-17 NOTE — Discharge Instructions (Signed)
Please read and follow all provided instructions.  Your diagnoses today include:  1. Lower abdominal pain     Tests performed today include:  Blood counts and electrolytes  Blood tests to check liver and kidney function  Blood tests to check pancreas function  Urine test to look for infection  CK -test for muscle breakdown and weakness was normal  CAT scan of your abdomen -showed a new lower spine compression fracture and a moderate amount of stool in the large intestine  Vital signs. See below for your results today.   Medications prescribed:   Bentyl - medication for intestinal cramps and spasms   Miralax - laxative  This medication can be found over-the-counter.   Take any prescribed medications only as directed.  Home care instructions:   Follow any educational materials contained in this packet.  Follow-up instructions: Please follow-up with your primary care provider in the next 2 days for further evaluation of your symptoms.    Return instructions:  SEEK IMMEDIATE MEDICAL ATTENTION IF:  The pain does not go away or becomes severe   A temperature above 101F develops   Repeated vomiting occurs (multiple episodes)   The pain becomes localized to portions of the abdomen. The right side could possibly be appendicitis. In an adult, the left lower portion of the abdomen could be colitis or diverticulitis.   Blood is being passed in stools or vomit (bright red or black tarry stools)   You develop chest pain, difficulty breathing, dizziness or fainting, or become confused, poorly responsive, or inconsolable (young children)  If you have any other emergent concerns regarding your health  Additional Information: Abdominal (belly) pain can be caused by many things. Your caregiver performed an examination and possibly ordered blood/urine tests and imaging (CT scan, x-rays, ultrasound). Many cases can be observed and treated at home after initial evaluation in the  emergency department. Even though you are being discharged home, abdominal pain can be unpredictable. Therefore, you need a repeated exam if your pain does not resolve, returns, or worsens. Most patients with abdominal pain don't have to be admitted to the hospital or have surgery, but serious problems like appendicitis and gallbladder attacks can start out as nonspecific pain. Many abdominal conditions cannot be diagnosed in one visit, so follow-up evaluations are very important.  Your vital signs today were: BP 96/73 (BP Location: Left Arm)    Pulse 87    Temp 97.8 F (36.6 C) (Oral)    Resp 18    Ht 5\' 4"  (1.626 m)    Wt 55.3 kg    SpO2 92%    BMI 20.94 kg/m  If your blood pressure (bp) was elevated above 135/85 this visit, please have this repeated by your doctor within one month. --------------

## 2018-01-17 NOTE — ED Triage Notes (Signed)
Abd pain since yesterday and the top of her legs feel weak and her vision is blurred

## 2018-01-24 ENCOUNTER — Other Ambulatory Visit: Payer: Self-pay

## 2018-01-24 ENCOUNTER — Encounter (HOSPITAL_COMMUNITY): Payer: Self-pay

## 2018-01-24 ENCOUNTER — Observation Stay (HOSPITAL_COMMUNITY)
Admission: AD | Admit: 2018-01-24 | Discharge: 2018-01-25 | Disposition: A | Discharge status: Home / Self Care | Payer: BLUE CROSS/BLUE SHIELD | Source: Other Acute Inpatient Hospital | Attending: Internal Medicine | Admitting: Internal Medicine

## 2018-01-24 DIAGNOSIS — G4730 Sleep apnea, unspecified: Secondary | ICD-10-CM | POA: Insufficient documentation

## 2018-01-24 DIAGNOSIS — K219 Gastro-esophageal reflux disease without esophagitis: Secondary | ICD-10-CM | POA: Diagnosis not present

## 2018-01-24 DIAGNOSIS — E785 Hyperlipidemia, unspecified: Secondary | ICD-10-CM | POA: Insufficient documentation

## 2018-01-24 DIAGNOSIS — Z825 Family history of asthma and other chronic lower respiratory diseases: Secondary | ICD-10-CM | POA: Insufficient documentation

## 2018-01-24 DIAGNOSIS — Z885 Allergy status to narcotic agent status: Secondary | ICD-10-CM | POA: Insufficient documentation

## 2018-01-24 DIAGNOSIS — T424X1A Poisoning by benzodiazepines, accidental (unintentional), initial encounter: Secondary | ICD-10-CM | POA: Diagnosis not present

## 2018-01-24 DIAGNOSIS — R0902 Hypoxemia: Secondary | ICD-10-CM | POA: Diagnosis not present

## 2018-01-24 DIAGNOSIS — K589 Irritable bowel syndrome without diarrhea: Secondary | ICD-10-CM | POA: Diagnosis not present

## 2018-01-24 DIAGNOSIS — R5381 Other malaise: Secondary | ICD-10-CM | POA: Diagnosis not present

## 2018-01-24 DIAGNOSIS — Z87442 Personal history of urinary calculi: Secondary | ICD-10-CM | POA: Insufficient documentation

## 2018-01-24 DIAGNOSIS — R52 Pain, unspecified: Secondary | ICD-10-CM | POA: Diagnosis not present

## 2018-01-24 DIAGNOSIS — Z7951 Long term (current) use of inhaled steroids: Secondary | ICD-10-CM | POA: Insufficient documentation

## 2018-01-24 DIAGNOSIS — Z86010 Personal history of colonic polyps: Secondary | ICD-10-CM | POA: Insufficient documentation

## 2018-01-24 DIAGNOSIS — F17210 Nicotine dependence, cigarettes, uncomplicated: Secondary | ICD-10-CM | POA: Diagnosis not present

## 2018-01-24 DIAGNOSIS — M797 Fibromyalgia: Secondary | ICD-10-CM | POA: Insufficient documentation

## 2018-01-24 DIAGNOSIS — R4020 Unspecified coma: Secondary | ICD-10-CM | POA: Diagnosis not present

## 2018-01-24 DIAGNOSIS — R4182 Altered mental status, unspecified: Secondary | ICD-10-CM | POA: Diagnosis not present

## 2018-01-24 DIAGNOSIS — R404 Transient alteration of awareness: Secondary | ICD-10-CM

## 2018-01-24 DIAGNOSIS — E079 Disorder of thyroid, unspecified: Secondary | ICD-10-CM | POA: Insufficient documentation

## 2018-01-24 DIAGNOSIS — F419 Anxiety disorder, unspecified: Secondary | ICD-10-CM | POA: Diagnosis not present

## 2018-01-24 DIAGNOSIS — G8929 Other chronic pain: Secondary | ICD-10-CM | POA: Insufficient documentation

## 2018-01-24 DIAGNOSIS — G43909 Migraine, unspecified, not intractable, without status migrainosus: Secondary | ICD-10-CM | POA: Diagnosis not present

## 2018-01-24 DIAGNOSIS — N809 Endometriosis, unspecified: Secondary | ICD-10-CM | POA: Insufficient documentation

## 2018-01-24 DIAGNOSIS — J9811 Atelectasis: Secondary | ICD-10-CM | POA: Diagnosis not present

## 2018-01-24 DIAGNOSIS — R531 Weakness: Secondary | ICD-10-CM | POA: Diagnosis not present

## 2018-01-24 DIAGNOSIS — Z9181 History of falling: Secondary | ICD-10-CM | POA: Insufficient documentation

## 2018-01-24 DIAGNOSIS — Z801 Family history of malignant neoplasm of trachea, bronchus and lung: Secondary | ICD-10-CM | POA: Insufficient documentation

## 2018-01-24 DIAGNOSIS — J441 Chronic obstructive pulmonary disease with (acute) exacerbation: Secondary | ICD-10-CM | POA: Diagnosis not present

## 2018-01-24 DIAGNOSIS — Z8541 Personal history of malignant neoplasm of cervix uteri: Secondary | ICD-10-CM | POA: Insufficient documentation

## 2018-01-24 DIAGNOSIS — I10 Essential (primary) hypertension: Secondary | ICD-10-CM | POA: Diagnosis not present

## 2018-01-24 DIAGNOSIS — Z743 Need for continuous supervision: Secondary | ICD-10-CM | POA: Diagnosis not present

## 2018-01-24 DIAGNOSIS — F319 Bipolar disorder, unspecified: Secondary | ICD-10-CM

## 2018-01-24 DIAGNOSIS — Z79899 Other long term (current) drug therapy: Secondary | ICD-10-CM | POA: Insufficient documentation

## 2018-01-24 DIAGNOSIS — N390 Urinary tract infection, site not specified: Secondary | ICD-10-CM | POA: Diagnosis not present

## 2018-01-24 DIAGNOSIS — M1990 Unspecified osteoarthritis, unspecified site: Secondary | ICD-10-CM | POA: Diagnosis not present

## 2018-01-24 DIAGNOSIS — Z90710 Acquired absence of both cervix and uterus: Secondary | ICD-10-CM | POA: Insufficient documentation

## 2018-01-24 DIAGNOSIS — F1320 Sedative, hypnotic or anxiolytic dependence, uncomplicated: Secondary | ICD-10-CM | POA: Insufficient documentation

## 2018-01-24 DIAGNOSIS — Z9049 Acquired absence of other specified parts of digestive tract: Secondary | ICD-10-CM | POA: Insufficient documentation

## 2018-01-24 DIAGNOSIS — N3020 Other chronic cystitis without hematuria: Secondary | ICD-10-CM | POA: Diagnosis not present

## 2018-01-24 DIAGNOSIS — Z950 Presence of cardiac pacemaker: Secondary | ICD-10-CM | POA: Insufficient documentation

## 2018-01-24 DIAGNOSIS — R569 Unspecified convulsions: Secondary | ICD-10-CM | POA: Insufficient documentation

## 2018-01-24 DIAGNOSIS — Z8249 Family history of ischemic heart disease and other diseases of the circulatory system: Secondary | ICD-10-CM | POA: Insufficient documentation

## 2018-01-24 DIAGNOSIS — R279 Unspecified lack of coordination: Secondary | ICD-10-CM | POA: Diagnosis not present

## 2018-01-24 DIAGNOSIS — Z888 Allergy status to other drugs, medicaments and biological substances status: Secondary | ICD-10-CM | POA: Insufficient documentation

## 2018-01-24 DIAGNOSIS — K648 Other hemorrhoids: Secondary | ICD-10-CM | POA: Insufficient documentation

## 2018-01-24 DIAGNOSIS — T43621A Poisoning by amphetamines, accidental (unintentional), initial encounter: Secondary | ICD-10-CM | POA: Insufficient documentation

## 2018-01-24 DIAGNOSIS — F1510 Other stimulant abuse, uncomplicated: Secondary | ICD-10-CM | POA: Diagnosis not present

## 2018-01-24 LAB — CBC
HCT: 36.9 % (ref 36.0–46.0)
Hemoglobin: 11.8 g/dL — ABNORMAL LOW (ref 12.0–15.0)
MCH: 31.9 pg (ref 26.0–34.0)
MCHC: 32 g/dL (ref 30.0–36.0)
MCV: 99.7 fL (ref 80.0–100.0)
Platelets: 322 10*3/uL (ref 150–400)
RBC: 3.7 MIL/uL — ABNORMAL LOW (ref 3.87–5.11)
RDW: 11.6 % (ref 11.5–15.5)
WBC: 11.9 10*3/uL — ABNORMAL HIGH (ref 4.0–10.5)
nRBC: 0 % (ref 0.0–0.2)

## 2018-01-24 LAB — CREATININE, SERUM
Creatinine, Ser: 0.87 mg/dL (ref 0.44–1.00)
GFR calc Af Amer: >60 mL/min (ref >60)
GFR calc non Af Amer: >60 mL/min (ref >60)

## 2018-01-24 MED ORDER — ONDANSETRON HCL 4 MG PO TABS: 4.0000 mg | ORAL_TABLET | Freq: Four times a day (QID) | ORAL | Status: DC | PRN

## 2018-01-24 MED ORDER — SODIUM CHLORIDE 0.9 % IV SOLN
INTRAVENOUS | Status: DC
  Administered 2018-01-24: 22:00:00 via INTRAVENOUS

## 2018-01-24 MED ORDER — PANTOPRAZOLE SODIUM 40 MG PO TBEC
40.0000 mg | DELAYED_RELEASE_TABLET | Freq: Every day | ORAL | Status: DC
  Administered 2018-01-24 – 2018-01-25 (×2): 40 mg via ORAL
  Filled 2018-01-24 (×2): qty 1

## 2018-01-24 MED ORDER — MOMETASONE FURO-FORMOTEROL FUM 100-5 MCG/ACT IN AERO
2.0000 | INHALATION_SPRAY | Freq: Two times a day (BID) | RESPIRATORY_TRACT | Status: DC
  Filled 2018-01-24: qty 8.8

## 2018-01-24 MED ORDER — ALBUTEROL SULFATE (2.5 MG/3ML) 0.083% IN NEBU: 3.0000 mL | INHALATION_SOLUTION | Freq: Four times a day (QID) | RESPIRATORY_TRACT | Status: DC | PRN

## 2018-01-24 MED ORDER — ENOXAPARIN SODIUM 40 MG/0.4ML ~~LOC~~ SOLN
40.0000 mg | SUBCUTANEOUS | Status: DC
  Administered 2018-01-24: 40 mg via SUBCUTANEOUS
  Filled 2018-01-24: qty 0.4

## 2018-01-24 MED ORDER — HYDROMORPHONE HCL 1 MG/ML IJ SOLN
0.5000 mg | Freq: Once | INTRAMUSCULAR | Status: AC
  Administered 2018-01-24: 0.5 mg via INTRAVENOUS
  Filled 2018-01-24: qty 0.5

## 2018-01-24 MED ORDER — POLYETHYLENE GLYCOL 3350 17 G PO PACK
17.0000 g | PACK | Freq: Every day | ORAL | Status: DC
  Administered 2018-01-24: 17 g via ORAL
  Filled 2018-01-24 (×2): qty 1

## 2018-01-24 MED ORDER — ONDANSETRON HCL 4 MG/2ML IJ SOLN
4.0000 mg | Freq: Four times a day (QID) | INTRAMUSCULAR | Status: DC | PRN
  Administered 2018-01-24: 4 mg via INTRAVENOUS
  Filled 2018-01-24: qty 2

## 2018-01-24 NOTE — H&P (Signed)
TRH H&P    Patient Demographics:    [name], is a 42 y.o. female  MRN: [id number]  DOB - Mar 16, 1976  Admit Date - 01/24/2018  Referring MD/NP/PA: Patient transferred from [place] Primary MD for the patient is Carollee Herter, Alferd Apa, DO  Chief complaint-generalized weakness, altered mental status   HPI:    [name]  is a 42 y.o. female, with history of bipolar disorder, COPD who was initially seen at [place] this morning after patient was found to have incoherent speech, generalized weakness,.  As per patient she has been falling and stumbling for past 1 week, there has been progressive confusion.  Patient was recently seen by her psychiatrist 2 weeks ago who changed her medications including Lamictal and gabapentin and increase the dose of these medications.  Patient at this time as much improved as per daughter.  Patient is alert oriented x3.  Answering all questions appropriately.  She has been coughing up for past few days also complains of generalized pain including both thighs lower back, abdomen.  Also has been having 6-8 loose bowel movements.  Patient says that she has IBS and this has been going on for many months. She denies chest pain. Denies shortness of breath at this time. In the ED at [facility] ER patient was found to be hypoxic with O2 sats 87% on room air.  Chest x-ray showed no significant abnormality.  CT head also was unremarkable(reports in the paper chart). At this time since O2 sats are 95% on room air. She denies dysuria, urgency or frequency of urination. No history of nausea vomiting or diarrhea.    Review of systems:      All other systems reviewed and are negative.   With Past History of the following :    Past Medical History:  Diagnosis Date  . 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 Anal fissure 03/11/2009  . Anemia   . Anxiety   .  Anxiety and depression   . ARTHRITIS 08/31/2007  . Arthritis   . Asthma   . 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 Bipolar 1 disorder ([place])   . Bowel obstruction ([place])   . BRONCHITIS, RECURRENT 08/23/2009   Asthmatic Bronchitis-Dr. Melvyn Novas.....-HFA 75% 12/04/2008>75% 02/05/2009>75% 08/04/2009 -PFT's 01/04/2009 2.56 (86%) ratio 75, no resp to B2 and DLC0 67% > 80 after correction   . Cancer (HCC)    cervical cancer  . Chronic interstitial cystitis 03/11/2009  . Chronic nausea   . Chronic pain   . Colon polyps   . COLONIC POLYPS, HX OF 07/25/2006   ADENOMATOUS POLYP  . COPD (chronic obstructive pulmonary disease) ([place])   . DEPRESSION 08/31/2007  . Endometriosis   . FIBROMYALGIA 08/31/2007  . Fibromyalgia   . GERD 02/05/2009  . Hyperlipidemia   . HYPERTENSION 08/31/2007  . IBS 03/11/2009  . Internal hemorrhoids   . Migraine headache   . 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 PONV (postoperative nausea and vomiting)   . RECTAL BLEEDING 03/11/2009  . Seizures ([place])    been  about 1 year since last seisure per pt  . SLEEP APNEA 08/31/2007  . Substance abuse ([place])   . Thyroid disease   . Uterine cyst       Past Surgical History:  Procedure Laterality Date  . ABDOMINAL HYSTERECTOMY    . bladder stretching x6    . BLADDER SURGERY     stimulator placed and stretching   . CHOLECYSTECTOMY    . COLONOSCOPY    . COLONOSCOPY WITH PROPOFOL N/A 09/03/2014   Procedure: COLONOSCOPY WITH PROPOFOL;  Surgeon: Milus Banister, MD;  Location: [facility] ENDOSCOPY;  Service: Endoscopy;  Laterality: N/A;  . interstitial cystitis    . PACEMAKER INSERTION     in hip for interstitial cystitis  . removal of uterine cyst and scrapped uterus    . replaced bladder pacemaker        Social History:      Social History   Tobacco Use  . Smoking status: Current Every Day Smoker    Packs/day: 1.00    Years: 14.00    Pack years: 14.00    Types: Cigarettes  . Smokeless tobacco: Never Used  . Tobacco comment: trying to  quit  Substance Use Topics  . Alcohol use: Not Currently    Alcohol/week: 0.0 standard drinks    Comment: rare wine        Family History :     Family History  Problem Relation Age of Onset  . Heart disease Father   . Asthma Maternal Grandmother   . Emphysema Maternal Grandfather   . Cancer Maternal Grandfather        Lung Cancer  . Cancer Other        Lung Cancer-Aunt  . Colon cancer Neg Hx   . Esophageal cancer Neg Hx   . Rectal cancer Neg Hx   . Stomach cancer Neg Hx   . Thyroid disease Neg Hx       Home Medications:   Prior to Admission medications   Medication Sig Start Date End Date Taking? Authorizing Provider  albuterol (PROVENTIL HFA;VENTOLIN HFA) 108 (90 Base) MCG/ACT inhaler Inhale 2 puffs into the lungs every 6 (six) hours as needed for wheezing or shortness of breath. 04/25/17  Yes Lindell Spar I, NP  budesonide-formoterol (SYMBICORT) 80-4.5 MCG/ACT inhaler Inhale 2 puffs into the lungs 2 (two) times daily.  12/13/17  Yes [provider]  clonazePAM (KLONOPIN) 0.5 MG tablet Take 1 tablet (0.5 mg total) by mouth 2 (two) times daily as needed for anxiety. 01/07/18  Yes Roma Schanz R, DO  dicyclomine (BENTYL) 20 MG tablet Take 1 tablet (20 mg total) by mouth 2 (two) times daily. 01/17/18  Yes Carlisle Cater, PA-C  FLUoxetine (PROZAC) 10 MG capsule Take 3 capsules (30 mg total) by mouth at bedtime. For mood control 04/25/17  Yes Nwoko, Herbert Pun I, NP  FLUoxetine (PROZAC) 40 MG capsule Take 40 mg by mouth daily. 01/11/18  Yes [provider]  gabapentin (NEURONTIN) 300 MG capsule Take 1 capsule (300 mg total) by mouth 3 (three) times daily. For agitation 04/25/17  Yes Lindell Spar I, NP  gabapentin (NEURONTIN) 300 MG capsule Take one capsule three time a day 01/11/18  Yes [provider]  HYDROcodone-acetaminophen (NORCO/VICODIN) 5-325 MG tablet Take by mouth. 01/01/18  Yes [provider]  hydrOXYzine (ATARAX/VISTARIL) 25 MG tablet  Take by mouth. 01/03/18  Yes [provider]  mirtazapine (REMERON SOL-TAB) 30 MG disintegrating tablet Take 1 tablet (30 mg total)  by mouth at bedtime. For depression 04/25/17  Yes Nwoko, Herbert Pun I, NP  omeprazole (PRILOSEC) 40 MG capsule Take 1 capsule (40 mg total) by mouth daily. For acid reflux 04/25/17  Yes Nwoko, Herbert Pun I, NP  ondansetron (ZOFRAN) 4 MG tablet Take 1 tablet (4 mg total) by mouth every 8 (eight) hours as needed for nausea or vomiting. 12/11/17  Yes [name], DO  oxybutynin (DITROPAN) 5 MG tablet Take by mouth. 01/03/18  Yes [provider]  polyethylene glycol powder (GLYCOLAX/MIRALAX) powder Take 17 g by mouth daily. 01/17/18  Yes Carlisle Cater, PA-C  potassium chloride SA (K-DUR,KLOR-CON) 20 MEQ tablet Take 1 tablet (20 mEq total) by mouth daily. 12/14/17  Yes Roma Schanz R, DO  risperiDONE (RISPERDAL) 2 MG tablet Take 1 tablet (2 mg total) by mouth at bedtime. For mood control 04/25/17  Yes Lindell Spar I, NP  traZODone (DESYREL) 50 MG tablet Take 1 tablet (50 mg total) by mouth at bedtime. For sleep 04/25/17  Yes Lindell Spar I, NP  Vilazodone HCl (VIIBRYD) 20 MG TABS  12/06/17  Yes [provider]  busPIRone (BUSPAR) 10 MG tablet Take by mouth.    [provider]  lamoTRIgine (LAMICTAL) 100 MG tablet  01/08/18   [provider]  amitriptyline (ELAVIL) 25 MG tablet 2 tablets by mouth at bedtime   06/21/11  [provider]  clidinium-chlordiazePOXIDE (LIBRAX) 2.5-5 MG per capsule 2 capsules by mouth every morning and 1 at bedtime   06/21/11  [provider]     Allergies:     Allergies  Allergen Reactions  . Abilify [Aripiprazole] Swelling, Other (See Comments) and Palpitations    tremors Throat swelling, tremors  . Metoclopramide Hcl Other (See Comments)    Causes seizures  . Propoxyphene Rash  . Tramadol Swelling, Other (See Comments) and Rash    Throat swelling, tremors  . Ambien [Zolpidem  Tartrate] Other (See Comments)    hallucinations  . Eszopiclone Other (See Comments)    Hallucinations, hyper, bad taste in mouth   . Amitriptyline     Other reaction(s): Angioedema (ALLERGY/intolerance)  . Metoclopramide     Other reaction(s): Other (See Comments) Seizures  . Other Swelling    Acetaminophen #3 Acetaminophen #3  . Varenicline Other (See Comments)    Suicidal thoughts  . Buprenorphine Hcl Itching and Hives  . Demerol [Meperidine] Rash  . Emetrol Itching, Rash and Hives    Other reaction(s): Rash (ALLERGY/intolerance)  . Morphine And Related Hives and Itching  . Propoxyphene N-Acetaminophen Hives, Itching and Rash    Other reaction(s): Rash (ALLERGY/intolerance)     Physical Exam:   Vitals  There were no vitals taken for this visit.  1.  General: Appears in no acute distress  2. Psychiatric:  Intact judgement and  insight, awake alert, oriented x 3.  3. Neurologic: No focal neurological deficits, all cranial nerves intact.Strength 5/5 all 4 extremities, sensation intact all 4 extremities, plantars down going.  4. Eyes :  anicteric sclerae, moist conjunctivae with no lid lag. PERRLA.  5. ENMT:  Oropharynx clear with moist mucous membranes and good dentition  6. Neck:  supple, no cervical lymphadenopathy appriciated, No thyromegaly  7. Respiratory : Normal respiratory effort, good air movement bilaterally,clear to  auscultation bilaterally  8. Cardiovascular : RRR, no gallops, rubs or murmurs, no leg edema  9. Gastrointestinal:  Positive bowel sounds, abdomen soft, non-tender to palpation,no hepatosplenomegaly, no rigidity or guarding  10. Skin:  No cyanosis, normal texture and turgor, no rash, lesions or ulcers  11.Musculoskeletal:  Good muscle tone,  joints appear normal , no effusions,  normal range of motion, muscle tenderness noted at the quadriceps, lower abdominal wall, lower back    Data Review:    CBC No results for  input(s): WBC, HGB, HCT, PLT, MCV, MCH, MCHC, RDW, LYMPHSABS, MONOABS, EOSABS, BASOSABS, BANDABS in the last 168 hours.  Invalid input(s): NEUTRABS, BANDSABD ------------------------------------------------------------------------------------------------------------------  No results found for this or any previous visit (from the past 70 hour(s)).  Chemistries  No results for input(s): NA, K, CL, CO2, GLUCOSE, BUN, CREATININE, CALCIUM, MG, AST, ALT, ALKPHOS, BILITOT in the last 168 hours.  Invalid input(s): GFRCGP ------------------------------------------------------------------------------------------------------------------  ------------------------------------------------------------------------------------------------------------------ GFR: Estimated Creatinine Clearance: 74.3 mL/min (by C-G formula based on SCr of 0.86 mg/dL). Liver Function Tests: No results for input(s): AST, ALT, ALKPHOS, BILITOT, PROT, ALBUMIN in the last 168 hours. No results for input(s): LIPASE, AMYLASE in the last 168 hours. No results for input(s): AMMONIA in the last 168 hours. Coagulation Profile: No results for input(s): INR, PROTIME in the last 168 hours. Cardiac Enzymes: No results for input(s): CKTOTAL, CKMB, CKMBINDEX, TROPONINI in the last 168 hours. BNP (last 3 results) No results for input(s): PROBNP in the last 8760 hours. HbA1C: No results for input(s): HGBA1C in the last 72 hours. CBG: No results for input(s): GLUCAP in the last 168 hours. Lipid Profile: No results for input(s): CHOL, HDL, LDLCALC, TRIG, CHOLHDL, LDLDIRECT in the last 72 hours. Thyroid Function Tests: No results for input(s): TSH, T4TOTAL, FREET4, T3FREE, THYROIDAB in the last 72 hours. Anemia Panel: No results for input(s): VITAMINB12, FOLATE, FERRITIN, TIBC, IRON, RETICCTPCT in the last 72 hours.     Imaging Results:     Assessment & Plan:    Active Problems:   Generalized weakness   1. Altered mental  status-resolved, likely from polypharmacy.  Patient is on multiple psychiatric medications for bipolar disorder including BuSpar, fluoxetine, gabapentin, Lamictal, mirtazapine, daily aspirin, risperidone, Klonopin.  Will hold all these medications.  Consult psychiatry in a.m. for adjustment of the medication doses.  We will also check UA.  Chest x-ray shows no acute abnormality.  2. Generalized weakness-patient has been falling at home for past 1 week, staggering with loss of balance.  Likely from above.  CT head is negative for stroke.  MRI could not be obtained as patient has bladder stimulator in place.  3. Bipolar disorder-as above patient on multiple psychotropic medications.  Currently on hold due to altered mental status and generalized weakness.  Will consult psychiatry in a.m. for adjustment of the medications.  4. COPD-currently not in exacerbation, O2 sats 95% on room air.  No wheezing or rhonchi auscultated on auscultation.  Start albuterol as needed.   DVT Prophylaxis-   Lovenox   AM Labs Ordered, also please review Full Orders  Family Communication: Admission, patients condition and plan of care including tests being ordered have been discussed with the patient, her daughter and mother at bedside who indicate understanding and agree with the plan and Code Status.  Code Status: Full code  Admission status: Observation  Time spent in minutes : 60 minutes   Oswald Hillock M.D on 01/24/2018 at 8:18 PM  Between 7am to 7pm - Pager - [phone]. After 7pm go to [email or web address] - password [facility]  [facility] - Office  [phone]

## 2018-01-25 DIAGNOSIS — J441 Chronic obstructive pulmonary disease with (acute) exacerbation: Secondary | ICD-10-CM | POA: Diagnosis not present

## 2018-01-25 DIAGNOSIS — F17210 Nicotine dependence, cigarettes, uncomplicated: Secondary | ICD-10-CM | POA: Diagnosis not present

## 2018-01-25 DIAGNOSIS — R4182 Altered mental status, unspecified: Secondary | ICD-10-CM | POA: Diagnosis not present

## 2018-01-25 DIAGNOSIS — M797 Fibromyalgia: Secondary | ICD-10-CM | POA: Diagnosis not present

## 2018-01-25 DIAGNOSIS — Z86010 Personal history of colonic polyps: Secondary | ICD-10-CM | POA: Diagnosis not present

## 2018-01-25 DIAGNOSIS — F319 Bipolar disorder, unspecified: Secondary | ICD-10-CM | POA: Diagnosis not present

## 2018-01-25 DIAGNOSIS — R531 Weakness: Secondary | ICD-10-CM

## 2018-01-25 DIAGNOSIS — K219 Gastro-esophageal reflux disease without esophagitis: Secondary | ICD-10-CM | POA: Diagnosis not present

## 2018-01-25 DIAGNOSIS — T43621A Poisoning by amphetamines, accidental (unintentional), initial encounter: Secondary | ICD-10-CM | POA: Diagnosis not present

## 2018-01-25 DIAGNOSIS — F1320 Sedative, hypnotic or anxiolytic dependence, uncomplicated: Secondary | ICD-10-CM | POA: Diagnosis not present

## 2018-01-25 DIAGNOSIS — Z720 Tobacco use: Secondary | ICD-10-CM | POA: Diagnosis not present

## 2018-01-25 DIAGNOSIS — Z8541 Personal history of malignant neoplasm of cervix uteri: Secondary | ICD-10-CM | POA: Diagnosis not present

## 2018-01-25 DIAGNOSIS — N809 Endometriosis, unspecified: Secondary | ICD-10-CM | POA: Diagnosis not present

## 2018-01-25 DIAGNOSIS — T424X1A Poisoning by benzodiazepines, accidental (unintentional), initial encounter: Secondary | ICD-10-CM | POA: Diagnosis not present

## 2018-01-25 DIAGNOSIS — F419 Anxiety disorder, unspecified: Secondary | ICD-10-CM | POA: Diagnosis not present

## 2018-01-25 DIAGNOSIS — R0902 Hypoxemia: Secondary | ICD-10-CM | POA: Diagnosis not present

## 2018-01-25 DIAGNOSIS — N3020 Other chronic cystitis without hematuria: Secondary | ICD-10-CM | POA: Diagnosis not present

## 2018-01-25 DIAGNOSIS — M1990 Unspecified osteoarthritis, unspecified site: Secondary | ICD-10-CM | POA: Diagnosis not present

## 2018-01-25 LAB — COMPREHENSIVE METABOLIC PANEL
ALT: 9 U/L (ref 0–44)
AST: 16 U/L (ref 15–41)
Albumin: 3.1 g/dL — ABNORMAL LOW (ref 3.5–5.0)
Alkaline Phosphatase: 75 U/L (ref 38–126)
Anion gap: 6 (ref 5–15)
BUN: 5 mg/dL — ABNORMAL LOW (ref 6–20)
CO2: 24 mmol/L (ref 22–32)
Calcium: 8.7 mg/dL — ABNORMAL LOW (ref 8.9–10.3)
Chloride: 109 mmol/L (ref 98–111)
Creatinine, Ser: 0.72 mg/dL (ref 0.44–1.00)
GFR calc Af Amer: >60 mL/min (ref >60)
GFR calc non Af Amer: >60 mL/min (ref >60)
Glucose, Bld: 112 mg/dL — ABNORMAL HIGH (ref 70–99)
Potassium: 4 mmol/L (ref 3.5–5.1)
Sodium: 139 mmol/L (ref 135–145)
Total Bilirubin: 0.2 mg/dL — ABNORMAL LOW (ref 0.3–1.2)
Total Protein: 6 g/dL — ABNORMAL LOW (ref 6.5–8.1)

## 2018-01-25 LAB — CBC
HCT: 35.4 % — ABNORMAL LOW (ref 36.0–46.0)
Hemoglobin: 11.1 g/dL — ABNORMAL LOW (ref 12.0–15.0)
MCH: 31 pg (ref 26.0–34.0)
MCHC: 31.4 g/dL (ref 30.0–36.0)
MCV: 98.9 fL (ref 80.0–100.0)
Platelets: 317 10*3/uL (ref 150–400)
RBC: 3.58 MIL/uL — ABNORMAL LOW (ref 3.87–5.11)
RDW: 11.4 % — ABNORMAL LOW (ref 11.5–15.5)
WBC: 11.3 10*3/uL — ABNORMAL HIGH (ref 4.0–10.5)
nRBC: 0 % (ref 0.0–0.2)

## 2018-01-25 LAB — URINALYSIS, ROUTINE W REFLEX MICROSCOPIC
Bilirubin Urine: NEGATIVE
Glucose, UA: NEGATIVE mg/dL
Hgb urine dipstick: NEGATIVE
Ketones, ur: NEGATIVE mg/dL
Leukocytes, UA: NEGATIVE
Nitrite: NEGATIVE
Protein, ur: NEGATIVE mg/dL
Specific Gravity, Urine: 1.009 (ref 1.005–1.030)
pH: 6 (ref 5.0–8.0)

## 2018-01-25 MED ORDER — PREDNISONE 20 MG PO TABS: ORAL_TABLET | ORAL | 0 refills | Status: DC

## 2018-01-25 MED ORDER — NICOTINE 21 MG/24HR TD PT24: 21.0000 mg | MEDICATED_PATCH | TRANSDERMAL | 1 refills | Status: DC

## 2018-01-25 MED ORDER — ACETAMINOPHEN 325 MG PO TABS
650.0000 mg | ORAL_TABLET | Freq: Four times a day (QID) | ORAL | Status: DC | PRN
  Administered 2018-01-25: 650 mg via ORAL
  Filled 2018-01-25: qty 2

## 2018-01-25 MED ORDER — GABAPENTIN 300 MG PO CAPS: 300.0000 mg | ORAL_CAPSULE | Freq: Two times a day (BID) | ORAL | Status: DC

## 2018-01-25 NOTE — Discharge Summary (Signed)
Physician Discharge Summary  [name] OEV:[id number] DOB: 01/25/1976 DOA: 01/24/2018  PCP: [name], DO  Admit date: 01/24/2018 Discharge date: 01/25/2018  Time spent: 35 minutes  Recommendations for Outpatient Follow-up:  1. Repeat BMET to follow electrolytes and renal function 2. Arrange follow up with psychiatrist 3. Please assist with smoking cessation.   Discharge Diagnoses:  Active Problems:   Bipolar disorder ([place])   COPD with acute exacerbation (HCC)   Generalized weakness GERD Tobacco abuse  Discharge Condition: Stable and improved. Patient discharge home with instructions to follow up with PCP in 10 days.    Diet recommendation: regular diet   History of present illness:  As per Dr. Darrick Orozco H&P written on 01/24/18 42 y.o. female, with history of bipolar disorder, COPD who was initially seen at [facility] this morning after patient was found to have incoherent speech, generalized weakness,.  As per patient she has been falling and stumbling for past 1 week, there has been progressive confusion.  Patient was recently seen by her psychiatrist 2 weeks ago who changed her medications including Lamictal and gabapentin and increase the dose of these medications.  Patient at this time as much improved as per daughter.  Patient is alert oriented x3.  Answering all questions appropriately.  She has been coughing up for past few days also complains of generalized pain including both thighs lower back, abdomen.  Also has been having 6-8 loose bowel movements.  Patient says that she has IBS and this has been going on for many months. She denies chest pain. Denies shortness of breath at this time. In the ED at [facility] ER patient was found to be hypoxic with O2 sats 87% on room air.  Chest x-ray showed no significant abnormality.  CT head also was unremarkable(reports in the paper chart). At this time since O2 sats are 95% on room air. She denies dysuria, urgency  or frequency of urination. No history of nausea vomiting or diarrhea.  Hospital Course:  1-COPD exacerbation -patient with mild hypoxia due to COPD exacerbation at [facility]. -treated with solumedrol, nebulizer treatment and oxygen supplementation. -at discharge with just minimal wheezing, speaking in full sentences and  Not requiring oxygen supplementation. -will discharge with quick steroids tapering and resumption of PRN albuterol and Symbicort BID.  2-altered mental status -likely polypharmacy related -UDS positive for benzodiazepines and amphetamines -will need outpatient follow up with PCP and psychiatrist -mentation is back to baseline  3-generalized weakness -CT head neg -reported chronically using crutches to assist with ambulation  -no electrolytes abnormalities   4-bipolar disorder -as mentioned above; continue home regimen and adjust medications as per psychiatrist rec's during follow up visit  5-GERD -continue PPI  6-tobacco abuse -I have discussed tobacco cessation with the patient.  I have counseled the patient regarding the negative impacts of continued tobacco use including but not limited to lung cancer, COPD, and cardiovascular disease.  I have discussed alternatives to tobacco and modalities that may help facilitate tobacco cessation including but not limited to biofeedback, hypnosis, and medications.  Total time spent with tobacco counseling was 5 minutes -nicoderm prescribed at discharge.  Procedures:  None   Consultations:  None   Discharge Exam: Vitals:   01/25/18 1005 01/25/18 1416  BP: 106/62 104/73  Pulse: (!) 101 77  Resp: 18 18  Temp:  98.2 F (36.8 C)  SpO2: 95% 96%    General: afebrile, no CP, no nausea, no vomiting. Good O2 sat on RA. Patient  AAOX3. Cardiovascular: S1 and S2, no rubs, no gallops Respiratory: good air movement bilaterally, very little wheezing on exam; no using accessory muscles. Positive rhonchi Abd: soft, NT, ND,  positive BS Extremities: no edema, no cyanosis   Discharge Instructions   Discharge Instructions    Call MD for:  redness, tenderness, or signs of infection (pain, swelling, redness, odor or green/yellow discharge around incision site)   Complete by:  As directed    Discharge instructions   Complete by:  As directed    Stop smoking Take medications as prescribed  Keep yourself well hydrated Follow up with PCP in 10 days and psychiatrist in 1 week.     Allergies as of 01/25/2018      Reactions   Abilify [aripiprazole] Swelling, Other (See Comments), Palpitations   tremors Throat swelling, tremors   Metoclopramide Hcl Other (See Comments)   Causes seizures   Propoxyphene Rash   Tramadol Swelling, Other (See Comments), Rash   Throat swelling, tremors   Ambien [zolpidem Tartrate] Other (See Comments)   hallucinations   Eszopiclone Other (See Comments)   Hallucinations, hyper, bad taste in mouth   Amitriptyline    Other reaction(s): Angioedema (ALLERGY/intolerance)   Metoclopramide    Other reaction(s): Other (See Comments) Seizures   Other Swelling   Acetaminophen #3 Acetaminophen #3   Varenicline Other (See Comments)   Suicidal thoughts   Buprenorphine Hcl Itching, Hives   Demerol [meperidine] Rash   Emetrol Itching, Rash, Hives   Other reaction(s): Rash (ALLERGY/intolerance)   Morphine And Related Hives, Itching   Propoxyphene N-acetaminophen Hives, Itching, Rash   Other reaction(s): Rash (ALLERGY/intolerance)      Medication List    STOP taking these medications   hydrOXYzine 25 MG tablet Commonly known as:  ATARAX/VISTARIL     TAKE these medications   albuterol 108 (90 Base) MCG/ACT inhaler Commonly known as:  PROVENTIL HFA;VENTOLIN HFA Inhale 2 puffs into the lungs every 6 (six) hours as needed for wheezing or shortness of breath.   clonazePAM 0.5 MG tablet Commonly known as:  KLONOPIN Take 1 tablet (0.5 mg total) by mouth 2 (two) times daily as needed  for anxiety. What changed:  how much to take   dicyclomine 20 MG tablet Commonly known as:  BENTYL Take 1 tablet (20 mg total) by mouth 2 (two) times daily.   FLUoxetine 10 MG capsule Commonly known as:  PROZAC Take 3 capsules (30 mg total) by mouth at bedtime. For mood control What changed:  Another medication with the same name was removed. Continue taking this medication, and follow the directions you see here.   gabapentin 300 MG capsule Commonly known as:  NEURONTIN Take 1 capsule (300 mg total) by mouth 2 (two) times daily. For agitation What changed:    when to take this  Another medication with the same name was removed. Continue taking this medication, and follow the directions you see here.   lamoTRIgine 100 MG tablet Commonly known as:  LAMICTAL Take 100 mg by mouth 2 (two) times daily.   mirtazapine 30 MG disintegrating tablet Commonly known as:  REMERON SOL-TAB Take 1 tablet (30 mg total) by mouth at bedtime. For depression   nicotine 21 mg/24hr patch Commonly known as:  NICODERM CQ - dosed in mg/24 hours Place 1 patch (21 mg total) onto the skin daily.   omeprazole 40 MG capsule Commonly known as:  PRILOSEC Take 1 capsule (40 mg total) by mouth daily. For acid reflux  ondansetron 4 MG tablet Commonly known as:  ZOFRAN Take 1 tablet (4 mg total) by mouth every 8 (eight) hours as needed for nausea or vomiting.   oxybutynin 5 MG tablet Commonly known as:  DITROPAN Take 5 mg by mouth 3 (three) times daily.   polyethylene glycol powder powder Commonly known as:  GLYCOLAX/MIRALAX Take 17 g by mouth daily.   potassium chloride SA 20 MEQ tablet Commonly known as:  K-DUR,KLOR-CON Take 1 tablet (20 mEq total) by mouth daily.   predniSONE 20 MG tablet Commonly known as:  DELTASONE Take 2 tablets by mouth daily X 2 days; then 1 tablet by mouth daily X 2 days; then 1/2 tablet by mouth daily X 3 days and stop prednisone.   risperiDONE 2 MG tablet Commonly  known as:  RISPERDAL Take 1 tablet (2 mg total) by mouth at bedtime. For mood control   SYMBICORT 80-4.5 MCG/ACT inhaler Generic drug:  budesonide-formoterol Inhale 2 puffs into the lungs 2 (two) times daily.   traZODone 50 MG tablet Commonly known as:  DESYREL Take 1 tablet (50 mg total) by mouth at bedtime. For sleep      Allergies  Allergen Reactions  . Abilify [Aripiprazole] Swelling, Other (See Comments) and Palpitations    tremors Throat swelling, tremors  . Metoclopramide Hcl Other (See Comments)    Causes seizures  . Propoxyphene Rash  . Tramadol Swelling, Other (See Comments) and Rash    Throat swelling, tremors  . Ambien [Zolpidem Tartrate] Other (See Comments)    hallucinations  . Eszopiclone Other (See Comments)    Hallucinations, hyper, bad taste in mouth   . Amitriptyline     Other reaction(s): Angioedema (ALLERGY/intolerance)  . Metoclopramide     Other reaction(s): Other (See Comments) Seizures  . Other Swelling    Acetaminophen #3 Acetaminophen #3  . Varenicline Other (See Comments)    Suicidal thoughts  . Buprenorphine Hcl Itching and Hives  . Demerol [Meperidine] Rash  . Emetrol Itching, Rash and Hives    Other reaction(s): Rash (ALLERGY/intolerance)  . Morphine And Related Hives and Itching  . Propoxyphene N-Acetaminophen Hives, Itching and Rash    Other reaction(s): Rash (ALLERGY/intolerance)   Follow-up Information    Roma Schanz R, DO. Schedule an appointment as soon as possible for a visit in 10 day(s).   Specialty:  Family Medicine Contact information: [address] [place] [address] [phone]          The results of significant diagnostics from this hospitalization (including imaging, microbiology, ancillary and laboratory) are listed below for reference.    Significant Diagnostic Studies: Ct Abdomen Pelvis W Contrast  Result Date: 01/17/2018 CLINICAL DATA:  Abd pain since yesterday and the top of  her legs feel weak and her vision is blurred Lower Abd pain since yesterday and the top of her legs feel weak and her vision is blurred. EXAM: CT ABDOMEN AND PELVIS WITH CONTRAST TECHNIQUE: Multidetector CT imaging of the abdomen and pelvis was performed using the standard protocol following bolus administration of intravenous contrast. CONTRAST:  113mL ISOVUE-300 IOPAMIDOL (ISOVUE-300) INJECTION 61% COMPARISON:  CT of the abdomen and pelvis on 07/31/2016 FINDINGS: Lower chest: There is bibasilar atelectasis, RIGHT greater than LEFT. Heart size is normal. Hepatobiliary: Status post cholecystectomy. The liver is homogeneous. Pancreas: Unremarkable. No pancreatic ductal dilatation or surrounding inflammatory changes. Spleen: Normal in size without focal abnormality. Adrenals/Urinary Tract: Adrenal glands are unremarkable. Kidneys are normal, without renal calculi, focal  lesion, or hydronephrosis. Bladder is unremarkable. Stomach/Bowel: Stomach is within normal limits. Appendix appears normal. No evidence of bowel wall thickening, distention, or inflammatory changes. Moderate stool burden. Vascular/Lymphatic: No significant vascular findings are present. No enlarged abdominal or pelvic lymph nodes. Reproductive: Hysterectomy.  No adnexal mass. Other: Patient has a LEFT pelvic stimulator. Musculoskeletal: Interval but chronic wedge compression fracture of L5. IMPRESSION: 1.  No evidence for acute  abnormality. 2. Moderate stool burden. 3. Cholecystectomy, hysterectomy. 4. Chronic wedge compression of at L5 is new since study in April 2018. Electronically Signed   By: Nolon Nations M.D.   On: 01/17/2018 20:56   Labs: Basic Metabolic Panel: Recent Labs  Lab 01/24/18 2049 01/25/18 0541  NA  --  139  K  --  4.0  CL  --  109  CO2  --  24  GLUCOSE  --  112*  BUN  --  5*  CREATININE 0.87 0.72  CALCIUM  --  8.7*   Liver Function Tests: Recent Labs  Lab 01/25/18 0541  AST 16  ALT 9  ALKPHOS 75  BILITOT  0.2*  PROT 6.0*  ALBUMIN 3.1*   CBC: Recent Labs  Lab 01/24/18 2049 01/25/18 0541  WBC 11.9* 11.3*  HGB 11.8* 11.1*  HCT 36.9 35.4*  MCV 99.7 98.9  PLT 322 317    Signed:  Barton Dubois MD.  [facility] 01/25/2018, 3:51 PM

## 2018-01-25 NOTE — Progress Notes (Signed)
Pt ambulated approximately 50 feet in hallway. Tolerated fair. Pt was a little weak and un-steady. States she uses crutches to get around at home and she feels those would be acceptable when she is discharged. MD made aware. Will continue to monitor.

## 2018-01-26 LAB — HIV ANTIBODY (ROUTINE TESTING W REFLEX): HIV Screen 4th Generation wRfx: NONREACTIVE

## 2018-02-05 ENCOUNTER — Inpatient Hospital Stay: Payer: Self-pay | Admitting: Family Medicine

## 2018-02-07 ENCOUNTER — Encounter: Payer: Self-pay | Admitting: Family Medicine

## 2018-02-07 ENCOUNTER — Ambulatory Visit: Payer: BLUE CROSS/BLUE SHIELD | Admitting: Family Medicine

## 2018-02-07 VITALS — BP 102/68 | HR 65 | Temp 98.1°F | Resp 16 | Ht 64.0 in | Wt 126.2 lb

## 2018-02-07 DIAGNOSIS — E2839 Other primary ovarian failure: Secondary | ICD-10-CM

## 2018-02-07 DIAGNOSIS — R4182 Altered mental status, unspecified: Secondary | ICD-10-CM | POA: Diagnosis not present

## 2018-02-07 DIAGNOSIS — S32050A Wedge compression fracture of fifth lumbar vertebra, initial encounter for closed fracture: Secondary | ICD-10-CM | POA: Diagnosis not present

## 2018-02-07 DIAGNOSIS — R531 Weakness: Secondary | ICD-10-CM

## 2018-02-07 LAB — COMPREHENSIVE METABOLIC PANEL
ALT: 7 U/L (ref 0–35)
AST: 11 U/L (ref 0–37)
Albumin: 3.7 g/dL (ref 3.5–5.2)
Alkaline Phosphatase: 98 U/L (ref 39–117)
BUN: 11 mg/dL (ref 6–23)
CO2: 32 mEq/L (ref 19–32)
Calcium: 8.8 mg/dL (ref 8.4–10.5)
Chloride: 101 mEq/L (ref 96–112)
Creatinine, Ser: 0.89 mg/dL (ref 0.40–1.20)
GFR: 74 mL/min (ref >60.00)
Glucose, Bld: 77 mg/dL (ref 70–99)
Potassium: 3.3 mEq/L — ABNORMAL LOW (ref 3.5–5.1)
Sodium: 138 mEq/L (ref 135–145)
Total Bilirubin: 0.2 mg/dL (ref 0.2–1.2)
Total Protein: 6 g/dL (ref 6.0–8.3)

## 2018-02-07 NOTE — Assessment & Plan Note (Signed)
Seen on ct in hosp Refer to ortho--- ? If etiology of weanness  Consider neuro

## 2018-02-07 NOTE — Progress Notes (Signed)
Patient ID: [name], female    DOB: Aug 25, 1975  Age: 42 y.o. MRN: [id number]    Subjective:  Subjective  HPI [name] presents for f/u er for synope --- pt meds had been inc by psych and she fell and passed out.  They deny head injury.  She was admitted   Review of Systems  Constitutional: Negative for appetite change, diaphoresis, fatigue and unexpected weight change.  Eyes: Negative for pain, redness and visual disturbance.  Respiratory: Negative for cough, chest tightness, shortness of breath and wheezing.   Cardiovascular: Negative for chest pain, palpitations and leg swelling.  Endocrine: Negative for cold intolerance, heat intolerance, polydipsia, polyphagia and polyuria.  Genitourinary: Negative for difficulty urinating, dysuria and frequency.  Neurological: Positive for weakness. Negative for dizziness, light-headedness, numbness and headaches.    History Past Medical History:  Diagnosis Date  . 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 Anal fissure 03/11/2009  . Anemia   . Anxiety   . Anxiety and depression   . ARTHRITIS 08/31/2007  . Arthritis   . Asthma   . 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 Bipolar 1 disorder ([place])   . Bowel obstruction ([place])   . BRONCHITIS, RECURRENT 08/23/2009   Asthmatic Bronchitis-Dr. Melvyn Novas.....-HFA 75% 12/04/2008>75% 02/05/2009>75% 08/04/2009 -PFT's 01/04/2009 2.56 (86%) ratio 75, no resp to B2 and DLC0 67% > 80 after correction   . Cancer (HCC)    cervical cancer  . Chronic interstitial cystitis 03/11/2009  . Chronic nausea   . Chronic pain   . Colon polyps   . COLONIC POLYPS, HX OF 07/25/2006   ADENOMATOUS POLYP  . COPD (chronic obstructive pulmonary disease) ([place])   . DEPRESSION 08/31/2007  . Endometriosis   . FIBROMYALGIA 08/31/2007  . Fibromyalgia   . GERD 02/05/2009  . Hyperlipidemia   . HYPERTENSION 08/31/2007  . IBS 03/11/2009  . Internal hemorrhoids   . Migraine headache   . 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 PONV (postoperative  nausea and vomiting)   . RECTAL BLEEDING 03/11/2009  . Seizures ([place])    been about 1 year since last seisure per pt  . SLEEP APNEA 08/31/2007  . Substance abuse ([place])   . Thyroid disease   . Uterine cyst     She has a past surgical history that includes Cholecystectomy; Abdominal hysterectomy; Pacemaker insertion; Bladder surgery; interstitial cystitis; bladder stretching x6; replaced bladder pacemaker; removal of uterine cyst and scrapped uterus; Colonoscopy; and Colonoscopy with propofol (N/A, 09/03/2014).   Her family history includes Asthma in her maternal grandmother; Cancer in her maternal grandfather and other; Emphysema in her maternal grandfather; Heart disease in her father.She reports that she has been smoking cigarettes. She has a 14.00 pack-year smoking history. She has never used smokeless tobacco. She reports that she drank alcohol. She reports that she has current or past drug history. Drug: Marijuana.  Current Outpatient Medications on File Prior to Visit  Medication Sig Dispense Refill  . albuterol (PROVENTIL HFA;VENTOLIN HFA) 108 (90 Base) MCG/ACT inhaler Inhale 2 puffs into the lungs every 6 (six) hours as needed for wheezing or shortness of breath. 1 Inhaler 0  . budesonide-formoterol (SYMBICORT) 80-4.5 MCG/ACT inhaler Inhale 2 puffs into the lungs 2 (two) times daily.     Marland Kitchen FLUoxetine (PROZAC) 10 MG capsule Take 3 capsules (30 mg total) by mouth at bedtime. For mood control 30 capsule 0  . gabapentin (NEURONTIN) 300 MG capsule Take 1 capsule (300 mg total) by mouth 2 (two) times daily. For agitation    .  lamoTRIgine (LAMICTAL) 100 MG tablet Take 100 mg by mouth 2 (two) times daily.   2  . mirtazapine (REMERON SOL-TAB) 30 MG disintegrating tablet Take 1 tablet (30 mg total) by mouth at bedtime. For depression 30 tablet 0  . nicotine (NICODERM CQ - DOSED IN MG/24 HOURS) 21 mg/24hr patch Place 1 patch (21 mg total) onto the skin daily. 30 patch 1  . omeprazole (PRILOSEC) 40 MG  capsule Take 1 capsule (40 mg total) by mouth daily. For acid reflux    . ondansetron (ZOFRAN) 4 MG tablet Take 1 tablet (4 mg total) by mouth every 8 (eight) hours as needed for nausea or vomiting. 45 tablet 0  . oxybutynin (DITROPAN) 5 MG tablet Take 5 mg by mouth 3 (three) times daily.     . polyethylene glycol powder (GLYCOLAX/MIRALAX) powder Take 17 g by mouth daily. 255 g 0  . potassium chloride SA (K-DUR,KLOR-CON) 20 MEQ tablet Take 1 tablet (20 mEq total) by mouth daily. 30 tablet 1  . risperiDONE (RISPERDAL) 2 MG tablet Take 1 tablet (2 mg total) by mouth at bedtime. For mood control 30 tablet 0  . traZODone (DESYREL) 50 MG tablet Take 1 tablet (50 mg total) by mouth at bedtime. For sleep 30 tablet 0  . [DISCONTINUED] amitriptyline (ELAVIL) 25 MG tablet 2 tablets by mouth at bedtime     . [DISCONTINUED] clidinium-chlordiazePOXIDE (LIBRAX) 2.5-5 MG per capsule 2 capsules by mouth every morning and 1 at bedtime      No current facility-administered medications on file prior to visit.      Objective:  Objective  Physical Exam  Constitutional: She is oriented to person, place, and time. She appears well-developed and well-nourished.  HENT:  Head: Normocephalic and atraumatic.  Eyes: Conjunctivae and EOM are normal.  Neck: Normal range of motion. Neck supple. No JVD present. Carotid bruit is not present. No thyromegaly present.  Cardiovascular: Normal rate, regular rhythm and normal heart sounds.  No murmur heard. Pulmonary/Chest: Effort normal and breath sounds normal. No respiratory distress. She has no wheezes. She has no rales. She exhibits no tenderness.  Musculoskeletal: She exhibits no edema.  Neurological: She is alert and oriented to person, place, and time.  Psychiatric: She has a normal mood and affect.  Nursing note and vitals reviewed.  BP 102/68 (BP Location: Left Arm, Cuff Size: Normal)   Pulse 65   Temp 98.1 F (36.7 C) (Oral)   Resp 16   Ht 5\' 4"  (1.626 m)   Wt  126 lb 3.2 oz (57.2 kg)   SpO2 96%   BMI 21.66 kg/m  Wt Readings from Last 3 Encounters:  02/07/18 126 lb 3.2 oz (57.2 kg)  01/17/18 122 lb (55.3 kg)  01/07/18 121 lb 9.6 oz (55.2 kg)     Lab Results  Component Value Date   WBC 11.3 (H) 01/25/2018   HGB 11.1 (L) 01/25/2018   HCT 35.4 (L) 01/25/2018   PLT 317 01/25/2018   GLUCOSE 112 (H) 01/25/2018   CHOL 143 04/24/2017   TRIG 85 04/24/2017   HDL 49 04/24/2017   LDLCALC 77 04/24/2017   ALT 9 01/25/2018   AST 16 01/25/2018   NA 139 01/25/2018   K 4.0 01/25/2018   CL 109 01/25/2018   CREATININE 0.72 01/25/2018   BUN 5 (L) 01/25/2018   CO2 24 01/25/2018   TSH 1.10 12/11/2017   HGBA1C 5.0 04/24/2017    No results found.   Assessment & Plan:  Plan  I have discontinued Damani R. Scholz's clonazePAM, dicyclomine, and predniSONE. I am also having her maintain her albuterol, FLUoxetine, omeprazole, risperiDONE, traZODone, mirtazapine, ondansetron, potassium chloride SA, polyethylene glycol powder, budesonide-formoterol, lamoTRIgine, oxybutynin, gabapentin, and nicotine.  No orders of the defined types were placed in this encounter.   Problem List Items Addressed This Visit      Unprioritized   Altered mental status    Pt is back to baseline Etiology --- poly pharmacy Pt will go to psych tomorrow       Relevant Orders   Comprehensive metabolic panel   Compression fracture of fifth lumbar vertebra (HCC) - Primary    Seen on ct in hosp Refer to ortho--- ? If etiology of weanness  Consider neuro       Relevant Orders   Ambulatory referral to Orthopedic Surgery   DG Bone Density   Generalized weakness   Relevant Orders   Comprehensive metabolic panel    Other Visit Diagnoses    Estrogen deficiency       Relevant Orders   DG Bone Density      Follow-up: Return in about 6 months (around 08/08/2018), or if symptoms worsen or fail to improve.  [name], DO

## 2018-02-07 NOTE — Patient Instructions (Signed)

## 2018-02-07 NOTE — Assessment & Plan Note (Signed)
Pt is back to baseline Etiology --- poly pharmacy Pt will go to psych tomorrow

## 2018-02-08 ENCOUNTER — Other Ambulatory Visit: Payer: Self-pay

## 2018-02-08 DIAGNOSIS — E876 Hypokalemia: Secondary | ICD-10-CM

## 2018-02-08 DIAGNOSIS — F411 Generalized anxiety disorder: Secondary | ICD-10-CM | POA: Diagnosis not present

## 2018-02-08 DIAGNOSIS — F3132 Bipolar disorder, current episode depressed, moderate: Secondary | ICD-10-CM | POA: Diagnosis not present

## 2018-02-12 DIAGNOSIS — R457 State of emotional shock and stress, unspecified: Secondary | ICD-10-CM | POA: Diagnosis not present

## 2018-02-19 ENCOUNTER — Ambulatory Visit (INDEPENDENT_AMBULATORY_CARE_PROVIDER_SITE_OTHER): Payer: Self-pay | Admitting: Family Medicine

## 2018-02-25 ENCOUNTER — Other Ambulatory Visit: Payer: BLUE CROSS/BLUE SHIELD

## 2018-03-04 DIAGNOSIS — F411 Generalized anxiety disorder: Secondary | ICD-10-CM | POA: Diagnosis not present

## 2018-03-04 DIAGNOSIS — F3132 Bipolar disorder, current episode depressed, moderate: Secondary | ICD-10-CM | POA: Diagnosis not present

## 2018-03-06 ENCOUNTER — Telehealth: Payer: Self-pay

## 2018-03-06 NOTE — Telephone Encounter (Signed)
TCM hospital follow up call made to patient. Patient states she know what I was talking saout but she does'n' t feel like she needs it.

## 2018-03-21 ENCOUNTER — Ambulatory Visit: Payer: BLUE CROSS/BLUE SHIELD | Admitting: Family Medicine

## 2018-03-21 ENCOUNTER — Other Ambulatory Visit (HOSPITAL_COMMUNITY)
Admission: RE | Admit: 2018-03-21 | Discharge: 2018-03-21 | Disposition: A | Discharge status: Home / Self Care | Payer: BLUE CROSS/BLUE SHIELD | Source: Ambulatory Visit | Attending: Family Medicine | Admitting: Family Medicine

## 2018-03-21 ENCOUNTER — Encounter: Payer: Self-pay | Admitting: Family Medicine

## 2018-03-21 VITALS — BP 108/78 | HR 80 | Temp 98.2°F | Resp 16 | Ht 64.0 in | Wt 121.4 lb

## 2018-03-21 DIAGNOSIS — R82998 Other abnormal findings in urine: Secondary | ICD-10-CM

## 2018-03-21 DIAGNOSIS — N898 Other specified noninflammatory disorders of vagina: Secondary | ICD-10-CM

## 2018-03-21 DIAGNOSIS — R350 Frequency of micturition: Secondary | ICD-10-CM | POA: Diagnosis not present

## 2018-03-21 LAB — POC URINALSYSI DIPSTICK (AUTOMATED)
Bilirubin, UA: NEGATIVE
Blood, UA: NEGATIVE
Glucose, UA: NEGATIVE
Ketones, UA: NEGATIVE
Leukocytes, UA: NEGATIVE
Nitrite, UA: POSITIVE
Protein, UA: NEGATIVE
Spec Grav, UA: 1.015 (ref 1.010–1.025)
Urobilinogen, UA: 0.2 E.U./dL
pH, UA: 6 (ref 5.0–8.0)

## 2018-03-21 MED ORDER — CIPROFLOXACIN HCL 250 MG PO TABS: 250.0000 mg | ORAL_TABLET | Freq: Two times a day (BID) | ORAL | 0 refills | Status: DC

## 2018-03-21 NOTE — Patient Instructions (Signed)

## 2018-03-21 NOTE — Progress Notes (Signed)
Patient ID: MALARIE TAPPEN, female    DOB: 1975/05/27  Age: 42 y.o. MRN: [id number]    Subjective:  Subjective  HPI CYERRA YIM presents for c/o vaginal d/c and odor.  No abd pain.  No dysuria.  No fever.    Review of Systems  Constitutional: Negative for chills and fever.  HENT: Negative for congestion and hearing loss.   Eyes: Negative for discharge.  Respiratory: Negative for cough and shortness of breath.   Cardiovascular: Negative for chest pain, palpitations and leg swelling.  Gastrointestinal: Negative for abdominal pain, blood in stool, constipation, diarrhea, nausea and vomiting.  Genitourinary: Positive for vaginal discharge. Negative for dysuria, frequency, hematuria and urgency.  Musculoskeletal: Negative for back pain and myalgias.  Skin: Negative for rash.  Allergic/Immunologic: Negative for environmental allergies.  Neurological: Negative for dizziness, weakness and headaches.  Hematological: Does not bruise/bleed easily.  Psychiatric/Behavioral: Negative for suicidal ideas. The patient is not nervous/anxious.     History Past Medical History:  Diagnosis Date  . 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 Anal fissure 03/11/2009  . Anemia   . Anxiety   . Anxiety and depression   . ARTHRITIS 08/31/2007  . Arthritis   . Asthma   . 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 Bipolar 1 disorder ([place])   . Bowel obstruction ([place])   . BRONCHITIS, RECURRENT 08/23/2009   Asthmatic Bronchitis-Dr. Melvyn Novas.....-HFA 75% 12/04/2008>75% 02/05/2009>75% 08/04/2009 -PFT's 01/04/2009 2.56 (86%) ratio 75, no resp to B2 and DLC0 67% > 80 after correction   . Cancer (HCC)    cervical cancer  . Chronic interstitial cystitis 03/11/2009  . Chronic nausea   . Chronic pain   . Colon polyps   . COLONIC POLYPS, HX OF 07/25/2006   ADENOMATOUS POLYP  . COPD (chronic obstructive pulmonary disease) ([place])   . DEPRESSION 08/31/2007  . Endometriosis   . FIBROMYALGIA 08/31/2007  . Fibromyalgia   . GERD  02/05/2009  . Hyperlipidemia   . HYPERTENSION 08/31/2007  . IBS 03/11/2009  . Internal hemorrhoids   . Migraine headache   . 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 PONV (postoperative nausea and vomiting)   . RECTAL BLEEDING 03/11/2009  . Seizures ([place])    been about 1 year since last seisure per pt  . SLEEP APNEA 08/31/2007  . Substance abuse ([place])   . Thyroid disease   . Uterine cyst     She has a past surgical history that includes Cholecystectomy; Abdominal hysterectomy; Pacemaker insertion; Bladder surgery; interstitial cystitis; bladder stretching x6; replaced bladder pacemaker; removal of uterine cyst and scrapped uterus; Colonoscopy; and Colonoscopy with propofol (N/A, 09/03/2014).   Her family history includes Asthma in her maternal grandmother; Cancer in her maternal grandfather and another family member; Emphysema in her maternal grandfather; Heart disease in her father.She reports that she has been smoking cigarettes. She has a 14.00 pack-year smoking history. She has never used smokeless tobacco. She reports previous alcohol use. She reports current drug use. Drug: Marijuana.  Current Outpatient Medications on File Prior to Visit  Medication Sig Dispense Refill  . albuterol (PROVENTIL HFA;VENTOLIN HFA) 108 (90 Base) MCG/ACT inhaler Inhale 2 puffs into the lungs every 6 (six) hours as needed for wheezing or shortness of breath. 1 Inhaler 0  . budesonide-formoterol (SYMBICORT) 80-4.5 MCG/ACT inhaler Inhale 2 puffs into the lungs 2 (two) times daily.     Marland Kitchen FLUoxetine (PROZAC) 10 MG capsule Take 3 capsules (30 mg total) by mouth at bedtime. For mood control 30 capsule 0  .  gabapentin (NEURONTIN) 300 MG capsule Take 1 capsule (300 mg total) by mouth 2 (two) times daily. For agitation    . lamoTRIgine (LAMICTAL) 100 MG tablet Take 100 mg by mouth 2 (two) times daily.   2  . mirtazapine (REMERON SOL-TAB) 30 MG disintegrating tablet Take 1 tablet (30 mg total) by mouth at bedtime. For depression 30  tablet 0  . ondansetron (ZOFRAN) 4 MG tablet Take 1 tablet (4 mg total) by mouth every 8 (eight) hours as needed for nausea or vomiting. 45 tablet 0  . oxybutynin (DITROPAN) 5 MG tablet Take 5 mg by mouth 3 (three) times daily.     . polyethylene glycol powder (GLYCOLAX/MIRALAX) powder Take 17 g by mouth daily. 255 g 0  . potassium chloride SA (K-DUR,KLOR-CON) 20 MEQ tablet Take 1 tablet (20 mEq total) by mouth daily. 30 tablet 1  . risperiDONE (RISPERDAL) 2 MG tablet Take 1 tablet (2 mg total) by mouth at bedtime. For mood control 30 tablet 0  . [DISCONTINUED] amitriptyline (ELAVIL) 25 MG tablet 2 tablets by mouth at bedtime     . [DISCONTINUED] clidinium-chlordiazePOXIDE (LIBRAX) 2.5-5 MG per capsule 2 capsules by mouth every morning and 1 at bedtime      No current facility-administered medications on file prior to visit.      Objective:  Objective  Physical Exam Vitals signs and nursing note reviewed. Exam conducted with a chaperone present.  Constitutional:      Appearance: She is well-developed.  HENT:     Head: Normocephalic and atraumatic.  Eyes:     Conjunctiva/sclera: Conjunctivae normal.  Neck:     Musculoskeletal: Normal range of motion and neck supple.     Thyroid: No thyromegaly.     Vascular: No carotid bruit or JVD.  Cardiovascular:     Rate and Rhythm: Normal rate and regular rhythm.     Heart sounds: Normal heart sounds. No murmur.  Pulmonary:     Effort: Pulmonary effort is normal. No respiratory distress.     Breath sounds: Normal breath sounds. No wheezing or rales.  Chest:     Chest wall: No tenderness.  Genitourinary:    Vagina: Normal.     Cervix: Discharge present. No cervical motion tenderness, friability, lesion, erythema, cervical bleeding or eversion.  Neurological:     Mental Status: She is alert and oriented to person, place, and time.    BP 108/78 (BP Location: Right Arm, Cuff Size: Normal)   Pulse 80   Temp 98.2 F (36.8 C) (Oral)   Resp  16   Ht 5\' 4"  (1.626 m)   Wt 121 lb 6.4 oz (55.1 kg)   SpO2 96%   BMI 20.84 kg/m  Wt Readings from Last 3 Encounters:  03/21/18 121 lb 6.4 oz (55.1 kg)  02/07/18 126 lb 3.2 oz (57.2 kg)  01/17/18 122 lb (55.3 kg)     Lab Results  Component Value Date   WBC 11.3 (H) 01/25/2018   HGB 11.1 (L) 01/25/2018   HCT 35.4 (L) 01/25/2018   PLT 317 01/25/2018   GLUCOSE 77 02/07/2018   CHOL 143 04/24/2017   TRIG 85 04/24/2017   HDL 49 04/24/2017   LDLCALC 77 04/24/2017   ALT 7 02/07/2018   AST 11 02/07/2018   NA 138 02/07/2018   K 3.3 (L) 02/07/2018   CL 101 02/07/2018   CREATININE 0.89 02/07/2018   BUN 11 02/07/2018   CO2 32 02/07/2018   TSH 1.10 12/11/2017  HGBA1C 5.0 04/24/2017    No results found.   Assessment & Plan:  Plan  I have discontinued Alawna R. Sansone's omeprazole, traZODone, and nicotine. I am also having her start on ciprofloxacin. Additionally, I am having her maintain her albuterol, FLUoxetine, risperiDONE, mirtazapine, ondansetron, potassium chloride SA, polyethylene glycol powder, budesonide-formoterol, lamoTRIgine, oxybutynin, and gabapentin.  Meds ordered this encounter  Medications  . ciprofloxacin (CIPRO) 250 MG tablet    Sig: Take 1 tablet (250 mg total) by mouth 2 (two) times daily.    Dispense:  6 tablet    Refill:  0    Problem List Items Addressed This Visit    None    Visit Diagnoses    Vaginal discharge    -  Primary   Relevant Orders   Cervicovaginal ancillary only( [place])   Urinary frequency       Relevant Medications   ciprofloxacin (CIPRO) 250 MG tablet   Other Relevant Orders   POCT Urinalysis Dipstick (Automated) (Completed)   Urine Culture   Other abnormal findings in urine       Relevant Orders   Urine Culture    ua+ nitrates--- culture sent  Follow-up: No follow-ups on file.  [place], DO    [place]

## 2018-03-25 LAB — URINE CULTURE
MICRO NUMBER:: 91495529
SPECIMEN QUALITY:: ADEQUATE

## 2018-03-26 ENCOUNTER — Other Ambulatory Visit: Payer: Self-pay | Admitting: Family Medicine

## 2018-03-26 DIAGNOSIS — B9689 Other specified bacterial agents as the cause of diseases classified elsewhere: Secondary | ICD-10-CM

## 2018-03-26 DIAGNOSIS — N760 Acute vaginitis: Secondary | ICD-10-CM

## 2018-03-26 LAB — CERVICOVAGINAL ANCILLARY ONLY
Bacterial vaginitis: POSITIVE — AB
Candida vaginitis: POSITIVE — AB
Chlamydia: NEGATIVE
Neisseria Gonorrhea: NEGATIVE
Trichomonas: NEGATIVE

## 2018-03-26 MED ORDER — FLUCONAZOLE 150 MG PO TABS: ORAL_TABLET | ORAL | 0 refills | Status: DC

## 2018-03-26 MED ORDER — METRONIDAZOLE 500 MG PO TABS: 500.0000 mg | ORAL_TABLET | Freq: Two times a day (BID) | ORAL | 0 refills | Status: DC

## 2018-04-05 DIAGNOSIS — F3132 Bipolar disorder, current episode depressed, moderate: Secondary | ICD-10-CM | POA: Diagnosis not present

## 2018-04-05 DIAGNOSIS — F411 Generalized anxiety disorder: Secondary | ICD-10-CM | POA: Diagnosis not present

## 2018-05-07 DIAGNOSIS — F411 Generalized anxiety disorder: Secondary | ICD-10-CM | POA: Diagnosis not present

## 2018-05-07 DIAGNOSIS — F3132 Bipolar disorder, current episode depressed, moderate: Secondary | ICD-10-CM | POA: Diagnosis not present

## 2018-05-30 ENCOUNTER — Other Ambulatory Visit: Payer: Self-pay | Admitting: Family Medicine

## 2018-05-30 DIAGNOSIS — R112 Nausea with vomiting, unspecified: Secondary | ICD-10-CM

## 2018-07-10 DIAGNOSIS — Z79899 Other long term (current) drug therapy: Secondary | ICD-10-CM | POA: Diagnosis not present

## 2018-07-10 DIAGNOSIS — F411 Generalized anxiety disorder: Secondary | ICD-10-CM | POA: Diagnosis not present

## 2018-07-10 DIAGNOSIS — F319 Bipolar disorder, unspecified: Secondary | ICD-10-CM | POA: Diagnosis not present

## 2018-08-09 ENCOUNTER — Encounter: Payer: Self-pay | Admitting: Family Medicine

## 2018-08-09 ENCOUNTER — Other Ambulatory Visit: Payer: Self-pay

## 2018-08-09 ENCOUNTER — Ambulatory Visit (INDEPENDENT_AMBULATORY_CARE_PROVIDER_SITE_OTHER): Payer: BLUE CROSS/BLUE SHIELD | Admitting: Family Medicine

## 2018-08-09 DIAGNOSIS — R1110 Vomiting, unspecified: Secondary | ICD-10-CM | POA: Insufficient documentation

## 2018-08-09 DIAGNOSIS — R112 Nausea with vomiting, unspecified: Secondary | ICD-10-CM | POA: Diagnosis not present

## 2018-08-09 MED ORDER — ONDANSETRON HCL 4 MG PO TABS: ORAL_TABLET | ORAL | 0 refills | Status: DC

## 2018-08-09 NOTE — Progress Notes (Signed)
Virtual Visit via Video Note  I connected with [name] on 08/09/18 at 10:00 AM EDT by a video enabled telemedicine application and verified that I am speaking with the correct person using two identifiers.  Location: Patient: home Provider: work    I discussed the limitations of evaluation and management by telemedicine and the availability of in person appointments. The patient expressed understanding and agreed to proceed.  History of Present Illness: Pt is home c/o NVD---  Loose stools 6-7 x a day and vomiting 4-5 x a day  Symptoms x 3 weeks --- unable to hold any liquid or solid food down. No abd pain + fevers 102 She is also coughing some-- but its not severe--- NVD are the worst problem right now   Past Medical History:  Diagnosis Date  . 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 Anal fissure 03/11/2009  . Anemia   . Anxiety   . Anxiety and depression   . ARTHRITIS 08/31/2007  . Arthritis   . Asthma   . 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 Bipolar 1 disorder ([place])   . Bowel obstruction ([place])   . BRONCHITIS, RECURRENT 08/23/2009   Asthmatic Bronchitis-Dr. Melvyn Novas.....-HFA 75% 12/04/2008>75% 02/05/2009>75% 08/04/2009 -PFT's 01/04/2009 2.56 (86%) ratio 75, no resp to B2 and DLC0 67% > 80 after correction   . Cancer (HCC)    cervical cancer  . Chronic interstitial cystitis 03/11/2009  . Chronic nausea   . Chronic pain   . Colon polyps   . COLONIC POLYPS, HX OF 07/25/2006   ADENOMATOUS POLYP  . COPD (chronic obstructive pulmonary disease) ([place])   . DEPRESSION 08/31/2007  . Endometriosis   . FIBROMYALGIA 08/31/2007  . Fibromyalgia   . GERD 02/05/2009  . Hyperlipidemia   . HYPERTENSION 08/31/2007  . IBS 03/11/2009  . Internal hemorrhoids   . Migraine headache   . 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 PONV (postoperative nausea and vomiting)   . RECTAL BLEEDING 03/11/2009  . Seizures ([place])    been about 1 year since last seisure per pt  . SLEEP APNEA 08/31/2007  . Substance abuse  ([place])   . Thyroid disease   . Uterine cyst    Outpatient Encounter Medications as of 08/09/2018  Medication Sig  . albuterol (PROVENTIL HFA;VENTOLIN HFA) 108 (90 Base) MCG/ACT inhaler Inhale 2 puffs into the lungs every 6 (six) hours as needed for wheezing or shortness of breath.  . budesonide-formoterol (SYMBICORT) 80-4.5 MCG/ACT inhaler Inhale 2 puffs into the lungs 2 (two) times daily.   Marland Kitchen FLUoxetine (PROZAC) 10 MG capsule Take 3 capsules (30 mg total) by mouth at bedtime. For mood control  . gabapentin (NEURONTIN) 300 MG capsule Take 1 capsule (300 mg total) by mouth 2 (two) times daily. For agitation  . lamoTRIgine (LAMICTAL) 100 MG tablet Take 100 mg by mouth 2 (two) times daily.   . mirtazapine (REMERON SOL-TAB) 30 MG disintegrating tablet Take 1 tablet (30 mg total) by mouth at bedtime. For depression  . ondansetron (ZOFRAN) 4 MG tablet TAKE 1-2 TABLET BY MOUTH EVERY 8 HOURS AS NEEDED FOR NAUSEA AND VOMITING.  Marland Kitchen oxybutynin (DITROPAN) 5 MG tablet Take 5 mg by mouth 3 (three) times daily.   . polyethylene glycol powder (GLYCOLAX/MIRALAX) powder Take 17 g by mouth daily.  . potassium chloride SA (K-DUR,KLOR-CON) 20 MEQ tablet Take 1 tablet (20 mEq total) by mouth daily.  . risperiDONE (RISPERDAL) 2 MG tablet Take 1 tablet (2 mg total) by mouth at bedtime. For mood control  . [  DISCONTINUED] ciprofloxacin (CIPRO) 250 MG tablet Take 1 tablet (250 mg total) by mouth 2 (two) times daily.  . [DISCONTINUED] fluconazole (DIFLUCAN) 150 MG tablet 1 po x1, may repeat in 3 days prn  . [DISCONTINUED] metroNIDAZOLE (FLAGYL) 500 MG tablet Take 1 tablet (500 mg total) by mouth 2 (two) times daily.  . [DISCONTINUED] ondansetron (ZOFRAN) 4 MG tablet TAKE 1 TABLET BY MOUTH EVERY 8 HOURS AS NEEDED FOR NAUSEA AND VOMITING.  . [DISCONTINUED] amitriptyline (ELAVIL) 25 MG tablet 2 tablets by mouth at bedtime   . [DISCONTINUED] clidinium-chlordiazePOXIDE (LIBRAX) 2.5-5 MG per capsule 2 capsules by mouth every  morning and 1 at bedtime    No facility-administered encounter medications on file as of 08/09/2018.    Observations/Objective: Afebrile today Pt in NAD Resting comfortable on couch Her daughter is with her  No other vitals available   Assessment and Plan: 1. Intractable vomiting with nausea, unspecified vomiting type Viral gastro enteritis---- will inc zofran to 8 mg Try to sip fluids slowly-- pedialyte, gatorade etc If she is unable to hold down anything she was instructed to go to the ER  Pt understands and agrees with plan  - ondansetron (ZOFRAN) 4 MG tablet; TAKE 1-2 TABLET BY MOUTH EVERY 8 HOURS AS NEEDED FOR NAUSEA AND VOMITING.  Dispense: 60 tablet; Refill: 0 Viral gastro enteritis---- will inc zofran to 8 mg Try to sip fluids slowly-- pedialyte, gatorade etc If she is unable to hold down anything she was instructed to go to the ER  Pt understands and agrees with plan  Follow Up Instructions:    I discussed the assessment and treatment plan with the patient. The patient was provided an opportunity to ask questions and all were answered. The patient agreed with the plan and demonstrated an understanding of the instructions.   The patient was advised to call back or seek an in-person evaluation if the symptoms worsen or if the condition fails to improve as anticipated.  I provided 25 minutes of non-face-to-face time during this encounter.   [name], DO

## 2018-08-09 NOTE — Assessment & Plan Note (Signed)
Viral gastro enteritis---- will inc zofran to 8 mg Try to sip fluids slowly-- pedialyte, gatorade etc If she is unable to hold down anything she was instructed to go to the ER  Pt understands and agrees with plan

## 2018-08-21 ENCOUNTER — Telehealth: Payer: Self-pay

## 2018-08-21 NOTE — Telephone Encounter (Signed)
Copied from [place] [id number]. Topic: Appointment Scheduling - Scheduling Inquiry for Clinic >> Aug 21, 2018  1:01 PM Virl Axe D wrote: Reason for CRM: Pt called to schedule appt with Dr. Etter Sjogren. No answer on FC line. Please return call. Pt's phone is not working so please contact her on daughter's line [phone]

## 2018-08-22 ENCOUNTER — Telehealth: Payer: Self-pay | Admitting: *Deleted

## 2018-08-22 ENCOUNTER — Other Ambulatory Visit: Payer: Self-pay

## 2018-08-22 ENCOUNTER — Ambulatory Visit (INDEPENDENT_AMBULATORY_CARE_PROVIDER_SITE_OTHER): Payer: BLUE CROSS/BLUE SHIELD | Admitting: Family Medicine

## 2018-08-22 ENCOUNTER — Encounter: Payer: Self-pay | Admitting: Family Medicine

## 2018-08-22 DIAGNOSIS — R51 Headache: Secondary | ICD-10-CM

## 2018-08-22 DIAGNOSIS — Z7951 Long term (current) use of inhaled steroids: Secondary | ICD-10-CM | POA: Diagnosis not present

## 2018-08-22 DIAGNOSIS — Z79899 Other long term (current) drug therapy: Secondary | ICD-10-CM | POA: Diagnosis not present

## 2018-08-22 DIAGNOSIS — R300 Dysuria: Secondary | ICD-10-CM

## 2018-08-22 DIAGNOSIS — N760 Acute vaginitis: Secondary | ICD-10-CM | POA: Diagnosis not present

## 2018-08-22 DIAGNOSIS — F319 Bipolar disorder, unspecified: Secondary | ICD-10-CM | POA: Diagnosis not present

## 2018-08-22 DIAGNOSIS — B9689 Other specified bacterial agents as the cause of diseases classified elsewhere: Secondary | ICD-10-CM | POA: Diagnosis not present

## 2018-08-22 DIAGNOSIS — G43909 Migraine, unspecified, not intractable, without status migrainosus: Secondary | ICD-10-CM | POA: Diagnosis not present

## 2018-08-22 DIAGNOSIS — R519 Headache, unspecified: Secondary | ICD-10-CM

## 2018-08-22 DIAGNOSIS — F17200 Nicotine dependence, unspecified, uncomplicated: Secondary | ICD-10-CM | POA: Diagnosis not present

## 2018-08-22 MED ORDER — CIPROFLOXACIN HCL 250 MG PO TABS: 250.0000 mg | ORAL_TABLET | Freq: Two times a day (BID) | ORAL | 0 refills | Status: DC

## 2018-08-22 NOTE — Telephone Encounter (Signed)
Patient had appt today

## 2018-08-22 NOTE — Telephone Encounter (Signed)
Called to move patient appt time.  No answer at all 3 phone numbers.

## 2018-08-22 NOTE — Progress Notes (Signed)
Virtual Visit via Video Note  I connected with [name] on 08/22/18 at  1:15 PM EDT by a video enabled telemedicine application and verified that I am speaking with the correct person using two identifiers.  Location: Patient: in car---  As passenger  Provider: home   I discussed the limitations of evaluation and management by telemedicine and the availability of in person appointments. The patient expressed understanding and agreed to proceed.  History of Present Illness: Pt is in there car as a passenger -- she has had a migraine for 6 days with no relief .   Pt has tried excedrin, aleve, tylenol, BC with no relief.  It is waking her up in the middle of the night   It is the worst headache she has ever had---  + photophobia , phonophobia She also c/o dysuria and urinary urgency    Observations/Objective: No vitals due to pt passenger in a car  Pt in pain with headache Eyes closed during videovisit and then we had to change to tele visit due to losing reception Pt decided to go to Er for her headache.   Assessment and Plan: 1. Acute intractable headache, unspecified headache type Ct was ordered but pt wanted pain meds--- she has tylenol with codeine at home but she said it does not work She said tramadol would not work and neither would a muscle relaxer  Pt decided to go to Er in [place]  2. Dysuria If no relief f/u urology  - ciprofloxacin (CIPRO) 250 MG tablet; Take 1 tablet (250 mg total) by mouth 2 (two) times daily.  Dispense: 6 tablet; Refill: 0   Follow Up Instructions:    I discussed the assessment and treatment plan with the patient. The patient was provided an opportunity to ask questions and all were answered. The patient agreed with the plan and demonstrated an understanding of the instructions.   The patient was advised to call back or seek an in-person evaluation if the symptoms worsen or if the condition fails to improve as anticipated.  I provided 15 minutes of  non-face-to-face time during this encounter.   [name], DO

## 2018-08-22 NOTE — Telephone Encounter (Signed)
Patient is returning a call regarding setting up an appt.  Patient states she has migraines and would like an appt. As soon as possible.  CB# [phone]

## 2018-08-22 NOTE — Telephone Encounter (Signed)
Patient was contacted by Dr. Etter Sjogren.

## 2018-08-23 ENCOUNTER — Other Ambulatory Visit: Payer: Self-pay

## 2018-08-23 ENCOUNTER — Telehealth: Payer: Self-pay | Admitting: Family Medicine

## 2018-08-23 ENCOUNTER — Encounter: Payer: Self-pay | Admitting: Family Medicine

## 2018-08-23 ENCOUNTER — Ambulatory Visit (HOSPITAL_BASED_OUTPATIENT_CLINIC_OR_DEPARTMENT_OTHER)
Admission: RE | Admit: 2018-08-23 | Discharge: 2018-08-23 | Disposition: A | Discharge status: Home / Self Care | Payer: BLUE CROSS/BLUE SHIELD | Source: Ambulatory Visit | Attending: Family Medicine | Admitting: Family Medicine

## 2018-08-23 ENCOUNTER — Ambulatory Visit: Payer: BLUE CROSS/BLUE SHIELD | Admitting: Family Medicine

## 2018-08-23 VITALS — BP 116/77 | HR 104 | Temp 98.2°F | Resp 12 | Ht 64.0 in | Wt 121.6 lb

## 2018-08-23 DIAGNOSIS — G4452 New daily persistent headache (NDPH): Secondary | ICD-10-CM

## 2018-08-23 DIAGNOSIS — R51 Headache: Secondary | ICD-10-CM | POA: Diagnosis not present

## 2018-08-23 DIAGNOSIS — R519 Headache, unspecified: Secondary | ICD-10-CM

## 2018-08-23 MED ORDER — KETOROLAC TROMETHAMINE 60 MG/2ML IM SOLN
60.0000 mg | Freq: Once | INTRAMUSCULAR | Status: AC
  Administered 2018-08-23: 60 mg via INTRAMUSCULAR

## 2018-08-23 MED ORDER — PROMETHAZINE HCL 25 MG/ML IJ SOLN
25.0000 mg | Freq: Once | INTRAMUSCULAR | Status: AC
  Administered 2018-08-23: 25 mg via INTRAMUSCULAR

## 2018-08-23 MED ORDER — CYCLOBENZAPRINE HCL 10 MG PO TABS: 10.0000 mg | ORAL_TABLET | Freq: Three times a day (TID) | ORAL | 0 refills | Status: DC | PRN

## 2018-08-23 NOTE — Progress Notes (Signed)
Patient ID: [name], female    DOB: 1976/02/18  Age: 43 y.o. MRN: [id number]    Subjective:  Subjective  HPI [name] presents for c/o worse headache of her life.  She went to the er after we spoke yesterday .  They were able to give her a med that eased the headache but 2 hours after she left it was right back   Review of Systems  Constitutional: Negative for appetite change, diaphoresis, fatigue and unexpected weight change.  Eyes: Negative for pain, redness and visual disturbance.  Respiratory: Negative for cough, chest tightness, shortness of breath and wheezing.   Cardiovascular: Negative for chest pain, palpitations and leg swelling.  Gastrointestinal: Positive for nausea.  Endocrine: Negative for cold intolerance, heat intolerance, polydipsia, polyphagia and polyuria.  Genitourinary: Negative for difficulty urinating, dysuria and frequency.  Musculoskeletal: Positive for myalgias, neck pain and neck stiffness.  Neurological: Positive for headaches. Negative for dizziness, tremors, syncope, speech difficulty, weakness, light-headedness and numbness.    History Past Medical History:  Diagnosis Date  . 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 Anal fissure 03/11/2009  . Anemia   . Anxiety   . Anxiety and depression   . ARTHRITIS 08/31/2007  . Arthritis   . Asthma   . 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 Bipolar 1 disorder ([place])   . Bowel obstruction ([place])   . BRONCHITIS, RECURRENT 08/23/2009   Asthmatic Bronchitis-Dr. Melvyn Novas.....-HFA 75% 12/04/2008>75% 02/05/2009>75% 08/04/2009 -PFT's 01/04/2009 2.56 (86%) ratio 75, no resp to B2 and DLC0 67% > 80 after correction   . Cancer (HCC)    cervical cancer  . Chronic interstitial cystitis 03/11/2009  . Chronic nausea   . Chronic pain   . Colon polyps   . COLONIC POLYPS, HX OF 07/25/2006   ADENOMATOUS POLYP  . COPD (chronic obstructive pulmonary disease) ([place])   . DEPRESSION 08/31/2007  . Endometriosis   . FIBROMYALGIA  08/31/2007  . Fibromyalgia   . GERD 02/05/2009  . Hyperlipidemia   . HYPERTENSION 08/31/2007  . IBS 03/11/2009  . Internal hemorrhoids   . Migraine headache   . 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 PONV (postoperative nausea and vomiting)   . RECTAL BLEEDING 03/11/2009  . Seizures ([place])    been about 1 year since last seisure per pt  . SLEEP APNEA 08/31/2007  . Substance abuse ([place])   . Thyroid disease   . Uterine cyst     She has a past surgical history that includes Cholecystectomy; Abdominal hysterectomy; Pacemaker insertion; Bladder surgery; interstitial cystitis; bladder stretching x6; replaced bladder pacemaker; removal of uterine cyst and scrapped uterus; Colonoscopy; and Colonoscopy with propofol (N/A, 09/03/2014).   Her family history includes Asthma in her maternal grandmother; Cancer in her maternal grandfather and another family member; Emphysema in her maternal grandfather; Heart disease in her father.She reports that she has been smoking cigarettes. She has a 14.00 pack-year smoking history. She has never used smokeless tobacco. She reports previous alcohol use. She reports current drug use. Drug: Marijuana.  Current Outpatient Medications on File Prior to Visit  Medication Sig Dispense Refill  . albuterol (PROVENTIL HFA;VENTOLIN HFA) 108 (90 Base) MCG/ACT inhaler Inhale 2 puffs into the lungs every 6 (six) hours as needed for wheezing or shortness of breath. 1 Inhaler 0  . budesonide-formoterol (SYMBICORT) 80-4.5 MCG/ACT inhaler Inhale 2 puffs into the lungs 2 (two) times daily.     . ciprofloxacin (CIPRO) 250 MG tablet Take 1 tablet (250 mg total) by mouth 2 (two)  times daily. 6 tablet 0  . FLUoxetine (PROZAC) 10 MG capsule Take 3 capsules (30 mg total) by mouth at bedtime. For mood control 30 capsule 0  . gabapentin (NEURONTIN) 300 MG capsule Take 1 capsule (300 mg total) by mouth 2 (two) times daily. For agitation    . lamoTRIgine (LAMICTAL) 100 MG tablet Take 100 mg by mouth 2  (two) times daily.   2  . mirtazapine (REMERON SOL-TAB) 30 MG disintegrating tablet Take 1 tablet (30 mg total) by mouth at bedtime. For depression 30 tablet 0  . ondansetron (ZOFRAN) 4 MG tablet TAKE 1-2 TABLET BY MOUTH EVERY 8 HOURS AS NEEDED FOR NAUSEA AND VOMITING. 60 tablet 0  . oxybutynin (DITROPAN) 5 MG tablet Take 5 mg by mouth 3 (three) times daily.     . polyethylene glycol powder (GLYCOLAX/MIRALAX) powder Take 17 g by mouth daily. 255 g 0  . potassium chloride SA (K-DUR,KLOR-CON) 20 MEQ tablet Take 1 tablet (20 mEq total) by mouth daily. 30 tablet 1  . risperiDONE (RISPERDAL) 2 MG tablet Take 1 tablet (2 mg total) by mouth at bedtime. For mood control 30 tablet 0  . [DISCONTINUED] amitriptyline (ELAVIL) 25 MG tablet 2 tablets by mouth at bedtime     . [DISCONTINUED] clidinium-chlordiazePOXIDE (LIBRAX) 2.5-5 MG per capsule 2 capsules by mouth every morning and 1 at bedtime      No current facility-administered medications on file prior to visit.      Objective:  Objective  Physical Exam Vitals signs and nursing note reviewed.  Constitutional:      Appearance: She is well-developed.  HENT:     Head: Normocephalic and atraumatic.  Eyes:     Conjunctiva/sclera: Conjunctivae normal.  Neck:     Musculoskeletal: Normal range of motion and neck supple.     Thyroid: No thyromegaly.     Vascular: No carotid bruit or JVD.  Cardiovascular:     Rate and Rhythm: Normal rate and regular rhythm.     Heart sounds: Normal heart sounds. No murmur.  Pulmonary:     Effort: Pulmonary effort is normal. No respiratory distress.     Breath sounds: Normal breath sounds. No wheezing or rales.  Chest:     Chest wall: No tenderness.  Neurological:     Mental Status: She is alert and oriented to person, place, and time.    BP 116/77 (BP Location: Left Arm, Cuff Size: Normal)   Pulse (!) 104   Temp 98.2 F (36.8 C) (Oral)   Resp 12   Ht 5\' 4"  (1.626 m)   Wt 121 lb 9.6 oz (55.2 kg)   SpO2  98%   BMI 20.87 kg/m  Wt Readings from Last 3 Encounters:  08/23/18 121 lb 9.6 oz (55.2 kg)  08/09/18 121 lb (54.9 kg)  03/21/18 121 lb 6.4 oz (55.1 kg)     Lab Results  Component Value Date   WBC 11.3 (H) 01/25/2018   HGB 11.1 (L) 01/25/2018   HCT 35.4 (L) 01/25/2018   PLT 317 01/25/2018   GLUCOSE 77 02/07/2018   CHOL 143 04/24/2017   TRIG 85 04/24/2017   HDL 49 04/24/2017   LDLCALC 77 04/24/2017   ALT 7 02/07/2018   AST 11 02/07/2018   NA 138 02/07/2018   K 3.3 (L) 02/07/2018   CL 101 02/07/2018   CREATININE 0.89 02/07/2018   BUN 11 02/07/2018   CO2 32 02/07/2018   TSH 1.10 12/11/2017   HGBA1C 5.0 04/24/2017  No results found.   Assessment & Plan:  Plan  I am having Tine R. Wilmeth start on cyclobenzaprine. I am also having her maintain her albuterol, FLUoxetine, risperiDONE, mirtazapine, potassium chloride SA, polyethylene glycol powder, budesonide-formoterol, lamoTRIgine, oxybutynin, gabapentin, ondansetron, and ciprofloxacin. We administered ketorolac and promethazine.  Meds ordered this encounter  Medications  . ketorolac (TORADOL) injection 60 mg  . cyclobenzaprine (FLEXERIL) 10 MG tablet    Sig: Take 1 tablet (10 mg total) by mouth 3 (three) times daily as needed for muscle spasms.    Dispense:  30 tablet    Refill:  0  . promethazine (PHENERGAN) injection 25 mg    Problem List Items Addressed This Visit    None    Visit Diagnoses    Acute intractable headache, unspecified headache type    -  Primary   Relevant Medications   ketorolac (TORADOL) injection 60 mg (Completed)   cyclobenzaprine (FLEXERIL) 10 MG tablet   promethazine (PHENERGAN) injection 25 mg (Completed)   Other Relevant Orders   CBC with Differential/Platelet   Comprehensive metabolic panel   Sedimentation rate   New daily persistent headache       Relevant Medications   ketorolac (TORADOL) injection 60 mg (Completed)   cyclobenzaprine (FLEXERIL) 10 MG tablet   Other Relevant  Orders   CT Head Wo Contrast   CBC with Differential/Platelet   Comprehensive metabolic panel   Sedimentation rate      Follow-up: Return if symptoms worsen or fail to improve.-- if headache worsens -- go to [place], DO

## 2018-08-23 NOTE — Patient Instructions (Signed)
General Headache Without Cause  A headache is pain or discomfort felt around the head or neck area. The specific cause of a headache may not be found. There are many causes and types of headaches. A few common ones are:   Tension headaches.   Migraine headaches.   Cluster headaches.   Chronic daily headaches.  Follow these instructions at home:  Watch your condition for any changes. Let your health care provider know about them. Take these steps to help with your condition:  Managing pain          Take over-the-counter and prescription medicines only as told by your health care provider.   Lie down in a dark, quiet room when you have a headache.   If directed, put ice on your head and neck area:  ? Put ice in a plastic bag.  ? Place a towel between your skin and the bag.  ? Leave the ice on for 20 minutes, 2-3 times per day.   If directed, apply heat to the affected area. Use the heat source that your health care provider recommends, such as a moist heat pack or a heating pad.  ? Place a towel between your skin and the heat source.  ? Leave the heat on for 20-30 minutes.  ? Remove the heat if your skin turns bright red. This is especially important if you are unable to feel pain, heat, or cold. You may have a greater risk of getting burned.   Keep lights dim if bright lights bother you or make your headaches worse.  Eating and drinking   Eat meals on a regular schedule.   If you drink alcohol:  ? Limit how much you use to:   0-1 drink a day for women.   0-2 drinks a day for men.  ? Be aware of how much alcohol is in your drink. In the [place] one drink equals one 12 oz bottle of beer (355 mL), one 5 oz glass of wine (148 mL), or one 1 oz glass of hard liquor (44 mL).   Stop drinking caffeine, or decrease the amount of caffeine you drink.  General instructions     Keep a headache journal to help find out what may trigger your headaches. For example, write down:  ? What you eat and drink.  ? How much  sleep you get.  ? Any change to your diet or medicines.   Try massage or other relaxation techniques.   Limit stress.   Sit up straight, and do not tense your muscles.   Do not use any products that contain nicotine or tobacco, such as cigarettes, e-cigarettes, and chewing tobacco. If you need help quitting, ask your health care provider.   Exercise regularly as told by your health care provider.   Sleep on a regular schedule. Get 7-9 hours of sleep each night, or the amount recommended by your health care provider.   Keep all follow-up visits as told by your health care provider. This is important.  Contact a health care provider if:   Your symptoms are not helped by medicine.   You have a headache that is different from the usual headache.   You have nausea or you vomit.   You have a fever.  Get help right away if:   Your headache becomes severe quickly.   Your headache gets worse after moderate to intense physical activity.   You have repeated vomiting.   You have a stiff neck.     You have a loss of vision.   You have problems with speech.   You have pain in the eye or ear.   You have muscular weakness or loss of muscle control.   You lose your balance or have trouble walking.   You feel faint or pass out.   You have confusion.   You have a seizure.  Summary   A headache is pain or discomfort felt around the head or neck area.   There are many causes and types of headaches. In some cases, the cause may not be found.   Keep a headache journal to help find out what may trigger your headaches. Watch your condition for any changes. Let your health care provider know about them.   Contact a health care provider if you have a headache that is different from the usual headache, or if your symptoms are not helped by medicine.   Get help right away if your headache becomes severe, you vomit, you have a loss of vision, you lose your balance, or you have a seizure.  This information is not  intended to replace advice given to you by your health care provider. Make sure you discuss any questions you have with your health care provider.  Document Released: 03/27/2005 Document Revised: 10/15/2017 Document Reviewed: 10/15/2017  [employer] Interactive Patient Education  2019 [employer]

## 2018-08-23 NOTE — Telephone Encounter (Signed)
ok 

## 2018-08-23 NOTE — Telephone Encounter (Signed)
Ok -- needs 30 min

## 2018-08-24 ENCOUNTER — Encounter (HOSPITAL_COMMUNITY): Payer: Self-pay | Admitting: *Deleted

## 2018-08-24 ENCOUNTER — Other Ambulatory Visit: Payer: Self-pay

## 2018-08-24 ENCOUNTER — Emergency Department (HOSPITAL_COMMUNITY)
Admission: EM | Admit: 2018-08-24 | Discharge: 2018-08-24 | Disposition: A | Discharge status: Home / Self Care | Payer: BLUE CROSS/BLUE SHIELD | Attending: Emergency Medicine | Admitting: Emergency Medicine

## 2018-08-24 DIAGNOSIS — R519 Headache, unspecified: Secondary | ICD-10-CM

## 2018-08-24 DIAGNOSIS — Z79899 Other long term (current) drug therapy: Secondary | ICD-10-CM | POA: Diagnosis not present

## 2018-08-24 DIAGNOSIS — J449 Chronic obstructive pulmonary disease, unspecified: Secondary | ICD-10-CM | POA: Insufficient documentation

## 2018-08-24 DIAGNOSIS — Z950 Presence of cardiac pacemaker: Secondary | ICD-10-CM | POA: Diagnosis not present

## 2018-08-24 DIAGNOSIS — J45909 Unspecified asthma, uncomplicated: Secondary | ICD-10-CM | POA: Diagnosis not present

## 2018-08-24 DIAGNOSIS — F17210 Nicotine dependence, cigarettes, uncomplicated: Secondary | ICD-10-CM | POA: Diagnosis not present

## 2018-08-24 DIAGNOSIS — I10 Essential (primary) hypertension: Secondary | ICD-10-CM | POA: Diagnosis not present

## 2018-08-24 DIAGNOSIS — R51 Headache: Secondary | ICD-10-CM | POA: Insufficient documentation

## 2018-08-24 DIAGNOSIS — Z8541 Personal history of malignant neoplasm of cervix uteri: Secondary | ICD-10-CM | POA: Insufficient documentation

## 2018-08-24 LAB — CSF CELL COUNT WITH DIFFERENTIAL
RBC Count, CSF: 1 /mm3 — ABNORMAL HIGH
RBC Count, CSF: 16 /mm3 — ABNORMAL HIGH
Tube #: 1
Tube #: 4
WBC, CSF: 0 /mm3 (ref 0–5)
WBC, CSF: 1 /mm3 (ref 0–5)

## 2018-08-24 LAB — CBC WITH DIFFERENTIAL/PLATELET
Abs Immature Granulocytes: 0.02 10*3/uL (ref 0.00–0.07)
Absolute Monocytes: 730 cells/uL (ref 200–950)
Basophils Absolute: 44 cells/uL (ref 0–200)
Basophils Absolute: 0.1 10*3/uL (ref 0.0–0.1)
Basophils Relative: 0.3 %
Basophils Relative: 1 %
Eosinophils Absolute: 29 cells/uL (ref 15–500)
Eosinophils Absolute: 0.1 10*3/uL (ref 0.0–0.5)
Eosinophils Relative: 0.2 %
Eosinophils Relative: 1 %
HCT: 39.1 % (ref 35.0–45.0)
HCT: 38.1 % (ref 36.0–46.0)
Hemoglobin: 13.4 g/dL (ref 11.7–15.5)
Hemoglobin: 12.7 g/dL (ref 12.0–15.0)
Immature Granulocytes: 0 %
Lymphocytes Relative: 48 %
Lymphs Abs: 5592 cells/uL — ABNORMAL HIGH (ref 850–3900)
Lymphs Abs: 4.7 10*3/uL — ABNORMAL HIGH (ref 0.7–4.0)
MCH: 31.4 pg (ref 27.0–33.0)
MCH: 31.7 pg (ref 26.0–34.0)
MCHC: 34.3 g/dL (ref 32.0–36.0)
MCHC: 33.3 g/dL (ref 30.0–36.0)
MCV: 91.6 fL (ref 80.0–100.0)
MCV: 95 fL (ref 80.0–100.0)
MPV: 10.2 fL (ref 7.5–12.5)
Monocytes Absolute: 0.4 10*3/uL (ref 0.1–1.0)
Monocytes Relative: 5 %
Monocytes Relative: 4 %
Neutro Abs: 8205 cells/uL — ABNORMAL HIGH (ref 1500–7800)
Neutro Abs: 4.4 10*3/uL (ref 1.7–7.7)
Neutrophils Relative %: 56.2 %
Neutrophils Relative %: 46 %
Platelets: 451 10*3/uL — ABNORMAL HIGH (ref 140–400)
Platelets: 360 10*3/uL (ref 150–400)
RBC: 4.27 10*6/uL (ref 3.80–5.10)
RBC: 4.01 MIL/uL (ref 3.87–5.11)
RDW: 12.9 % (ref 11.0–15.0)
RDW: 13.8 % (ref 11.5–15.5)
Total Lymphocyte: 38.3 %
WBC: 14.6 10*3/uL — ABNORMAL HIGH (ref 3.8–10.8)
WBC: 9.5 10*3/uL (ref 4.0–10.5)
nRBC: 0 % (ref 0.0–0.2)

## 2018-08-24 LAB — PROTIME-INR
INR: 0.9 (ref 0.8–1.2)
Prothrombin Time: 11.7 seconds (ref 11.4–15.2)

## 2018-08-24 LAB — COMPREHENSIVE METABOLIC PANEL
AG Ratio: 1.3 (calc) (ref 1.0–2.5)
ALT: 15 U/L (ref 6–29)
ALT: 19 U/L (ref 0–44)
AST: 21 U/L (ref 10–30)
AST: 23 U/L (ref 15–41)
Albumin: 3.9 g/dL (ref 3.6–5.1)
Albumin: 3.6 g/dL (ref 3.5–5.0)
Alkaline Phosphatase: 102 U/L (ref 38–126)
Alkaline phosphatase (APISO): 110 U/L (ref 31–125)
Anion gap: 11 (ref 5–15)
BUN/Creatinine Ratio: 6 (calc) (ref 6–22)
BUN: 5 mg/dL — ABNORMAL LOW (ref 7–25)
BUN: 7 mg/dL (ref 6–20)
CO2: 26 mmol/L (ref 20–32)
CO2: 21 mmol/L — ABNORMAL LOW (ref 22–32)
Calcium: 10.1 mg/dL (ref 8.6–10.2)
Calcium: 9.1 mg/dL (ref 8.9–10.3)
Chloride: 109 mmol/L (ref 98–110)
Chloride: 108 mmol/L (ref 98–111)
Creat: 0.85 mg/dL (ref 0.50–1.10)
Creatinine, Ser: 0.73 mg/dL (ref 0.44–1.00)
GFR calc Af Amer: >60 mL/min (ref >60)
GFR calc non Af Amer: >60 mL/min (ref >60)
Globulin: 2.9 g/dL (calc) (ref 1.9–3.7)
Glucose, Bld: 85 mg/dL (ref 65–99)
Glucose, Bld: 142 mg/dL — ABNORMAL HIGH (ref 70–99)
Potassium: 4.3 mmol/L (ref 3.5–5.3)
Potassium: 3.6 mmol/L (ref 3.5–5.1)
Sodium: 143 mmol/L (ref 135–146)
Sodium: 140 mmol/L (ref 135–145)
Total Bilirubin: 0.2 mg/dL (ref 0.2–1.2)
Total Bilirubin: 0.2 mg/dL — ABNORMAL LOW (ref 0.3–1.2)
Total Protein: 6.8 g/dL (ref 6.1–8.1)
Total Protein: 7.1 g/dL (ref 6.5–8.1)

## 2018-08-24 LAB — SALICYLATE LEVEL: Salicylate Lvl: 10 mg/dL (ref 2.8–30.0)

## 2018-08-24 LAB — ACETAMINOPHEN LEVEL: Acetaminophen (Tylenol), Serum: 23 ug/mL (ref 10–30)

## 2018-08-24 LAB — CRYPTOCOCCAL ANTIGEN, CSF: Crypto Ag: NEGATIVE

## 2018-08-24 LAB — GLUCOSE, CSF: Glucose, CSF: 58 mg/dL (ref 40–70)

## 2018-08-24 LAB — PROTEIN, CSF: Total  Protein, CSF: 22 mg/dL (ref 15–45)

## 2018-08-24 LAB — SEDIMENTATION RATE: Sed Rate: 9 mm/h (ref 0–20)

## 2018-08-24 MED ORDER — SUMATRIPTAN SUCCINATE 50 MG PO TABS: 50.0000 mg | ORAL_TABLET | ORAL | 0 refills | Status: DC | PRN

## 2018-08-24 MED ORDER — VALPROATE SODIUM 500 MG/5ML IV SOLN
INTRAVENOUS | Status: AC
  Filled 2018-08-24: qty 5

## 2018-08-24 MED ORDER — HYDROMORPHONE HCL 1 MG/ML IJ SOLN
0.5000 mg | Freq: Once | INTRAMUSCULAR | Status: AC
  Administered 2018-08-24: 15:00:00 0.5 mg via INTRAVENOUS
  Filled 2018-08-24: qty 1

## 2018-08-24 MED ORDER — PROCHLORPERAZINE EDISYLATE 10 MG/2ML IJ SOLN
10.0000 mg | Freq: Once | INTRAMUSCULAR | Status: AC
  Administered 2018-08-24: 14:00:00 10 mg via INTRAVENOUS
  Filled 2018-08-24: qty 2

## 2018-08-24 MED ORDER — ONDANSETRON HCL 4 MG/2ML IJ SOLN
4.0000 mg | Freq: Once | INTRAMUSCULAR | Status: AC
  Administered 2018-08-24: 16:00:00 4 mg via INTRAVENOUS
  Filled 2018-08-24: qty 2

## 2018-08-24 MED ORDER — SODIUM CHLORIDE 0.9 % IV SOLN
INTRAVENOUS | Status: DC
  Administered 2018-08-24: 14:00:00 250 mL/h via INTRAVENOUS

## 2018-08-24 MED ORDER — VALPROATE SODIUM 500 MG/5ML IV SOLN
500.0000 mg | Freq: Once | INTRAVENOUS | Status: AC
  Administered 2018-08-24: 16:00:00 500 mg via INTRAVENOUS
  Filled 2018-08-24: qty 5

## 2018-08-24 MED ORDER — DIPHENHYDRAMINE HCL 50 MG/ML IJ SOLN
25.0000 mg | Freq: Once | INTRAMUSCULAR | Status: AC
  Administered 2018-08-24: 25 mg via INTRAVENOUS
  Filled 2018-08-24: qty 1

## 2018-08-24 NOTE — ED Provider Notes (Signed)
[facility] EMERGENCY DEPARTMENT Provider Note   CSN: [id number] Arrival date & time: 08/24/18  1327    History   Chief Complaint Chief Complaint  Patient presents with  . Headache    HPI [name] is a 43 y.o. female.     HPI  43 year old female, she has multiple medical problems including bipolar disorder, history of chronic pain, chronic nausea, fibromyalgia, history of seizures, chronic interstitial cystitis who currently takes multiple medications including Lamictal, gabapentin and recently started on Flexeril for her headaches.  She was seen initially at an outside ED where she was treated for headache, got slightly better and was discharged.  Yesterday she followed up with her family doctor because her headache was still very bad and a CT scan was ordered which was read as negative for any acute findings.  The patient reports that this is the worst headache of her life, she has never been diagnosed formally with migraines, the headache is persistent, it started 7 days ago rather acute in onset and was behind bilateral eyes which is now radiated to the top and the back of the head.  She is nauseated and photophobic and has some stiffness in the back of her neck.  There is been no fevers, she does endorse some slight blurred vision, she has no numbness or weakness of the arms or the legs and has no difficulty with balance.  She has been taking Excedrin and states that she has taken an entire bottle of Excedrin over 4 to 5 days.  Past Medical History:  Diagnosis Date  . 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 Anal fissure 03/11/2009  . Anemia   . Anxiety   . Anxiety and depression   . ARTHRITIS 08/31/2007  . Arthritis   . Asthma   . 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 Bipolar 1 disorder ([place])   . Bowel obstruction ([place])   . BRONCHITIS, RECURRENT 08/23/2009   Asthmatic Bronchitis-Dr. Melvyn Novas.....-HFA 75% 12/04/2008>75% 02/05/2009>75% 08/04/2009 -PFT's 01/04/2009 2.56 (86%) ratio  75, no resp to B2 and DLC0 67% > 80 after correction   . Cancer (HCC)    cervical cancer  . Chronic interstitial cystitis 03/11/2009  . Chronic nausea   . Chronic pain   . Colon polyps   . COLONIC POLYPS, HX OF 07/25/2006   ADENOMATOUS POLYP  . COPD (chronic obstructive pulmonary disease) ([place])   . DEPRESSION 08/31/2007  . Endometriosis   . FIBROMYALGIA 08/31/2007  . Fibromyalgia   . GERD 02/05/2009  . Hyperlipidemia   . HYPERTENSION 08/31/2007  . IBS 03/11/2009  . Internal hemorrhoids   . Migraine headache   . 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 PONV (postoperative nausea and vomiting)   . RECTAL BLEEDING 03/11/2009  . Seizures ([place])    been about 1 year since last seisure per pt  . SLEEP APNEA 08/31/2007  . Substance abuse ([place])   . Thyroid disease   . Uterine cyst     Patient Active Problem List   Diagnosis Date Noted  . Intractable vomiting 08/09/2018  . Compression fracture of fifth lumbar vertebra (HCC) 02/07/2018  . Altered mental status 02/07/2018  . Generalized weakness 01/24/2018  . MDD (major depressive disorder) 04/23/2017  . Substance abuse ([place]) 04/19/2017  . Hyperthyroidism 02/17/2015  . Anxiety 01/28/2015  . Clinical depression 01/28/2015  . COPD with acute exacerbation ([place]) 11/26/2013  . Vaginitis and vulvovaginitis 11/26/2013  . Recurrent pneumonia 02/02/2013  . Left arm pain 01/13/2013  . Left arm pain 01/13/2013  Class: Acute  . Dizziness and giddiness 01/13/2013  . Acute bronchitis 12/07/2012  . Rib pain 12/07/2012  . Insomnia 08/26/2012  . Diarrhea 06/19/2012  . Heme positive stool 06/19/2012  . Bloating 06/19/2012  . Nausea alone 06/19/2012  . Bladder retention 04/24/2012  . Tremor 03/05/2012  . Dysuria 03/05/2012  . Failure to thrive in adult 06/21/2011  . Airway hyperreactivity 03/08/2011  . Back pain, chronic 03/08/2011  . Chronic obstructive pulmonary disease ([place]) 03/08/2011  . Acid reflux 03/08/2011  . Cystitis 01/22/2011  . Bilateral  hand numbness 01/13/2011  . WEIGHT LOSS 06/24/2010  . RIB PAIN, LEFT SIDED 05/04/2010  . ABDOMINAL PAIN, LEFT UPPER QUADRANT 05/04/2010  . Unspecified otitis media 10/26/2009  . BRONCHITIS, RECURRENT 08/23/2009  . URI, ACUTE 08/04/2009  . FATIGUE 07/06/2009  . IBS 03/11/2009  . ANAL FISSURE 03/11/2009  . RECTAL BLEEDING 03/11/2009  . Chronic interstitial cystitis 03/11/2009  . COLONIC POLYPS, HX OF 03/11/2009  . GERD 02/05/2009  . SMOKER 12/04/2008  . chronic asthma poorly controlled 12/04/2008  . 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 Bipolar disorder ([place]) 08/31/2007  . HYPERTENSION 08/31/2007  . EMPHYSEMA 08/31/2007  . 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 ARTHRITIS 08/31/2007  . FIBROMYALGIA 08/31/2007  . SLEEP APNEA 08/31/2007    Past Surgical History:  Procedure Laterality Date  . ABDOMINAL HYSTERECTOMY    . bladder stretching x6    . BLADDER SURGERY     stimulator placed and stretching   . CHOLECYSTECTOMY    . COLONOSCOPY    . COLONOSCOPY WITH PROPOFOL N/A 09/03/2014   Procedure: COLONOSCOPY WITH PROPOFOL;  Surgeon: Milus Banister, MD;  Location: [facility] END[facility]COPY;  Service: Endoscopy;  Laterality: N/A;  . interstitial cystitis    . PACEMAKER INSERTION     in hip for interstitial cystitis  . removal of uterine cyst and scrapped uterus    . replaced bladder pacemaker       OB History    Gravida  3   Para  2   Term      Preterm      AB  1   Living  2     SAB  1   TAB  0   Ectopic  0   Multiple  0   Live Births  2            Home Medications    Prior to Admission medications   Medication Sig Start Date End Date Taking? Authorizing Provider  albuterol (PROVENTIL HFA;VENTOLIN HFA) 108 (90 Base) MCG/ACT inhaler Inhale 2 puffs into the lungs every 6 (six) hours as needed for wheezing or shortness of breath. 04/25/17  Yes Lindell Spar I, NP  budesonide-formoterol (SYMBICORT) 80-4.5 MCG/ACT inhaler Inhale 2 puffs  into the lungs 2 (two) times daily.  12/13/17  Yes [provider]  ciprofloxacin (CIPRO) 250 MG tablet Take 1 tablet (250 mg total) by mouth 2 (two) times daily. 08/22/18  Yes Roma Schanz R, DO  cyclobenzaprine (FLEXERIL) 10 MG tablet Take 1 tablet (10 mg total) by mouth 3 (three) times daily as needed for muscle spasms. 08/23/18  Yes Roma Schanz R, DO  FLUoxetine (PROZAC) 10 MG capsule Take 3 capsules (30 mg total) by mouth at bedtime. For mood control 04/25/17  Yes Lindell Spar I, NP  gabapentin (NEURONTIN) 300 MG capsule Take 1 capsule (300 mg total) by mouth 2 (two) times daily. For agitation 01/25/18  Yes Barton Dubois, MD  lamoTRIgine (LAMICTAL) 100 MG tablet Take 100 mg by mouth 2 (two) times daily.  01/08/18  Yes [provider]  mirtazapine (REMERON SOL-TAB) 30 MG disintegrating tablet Take 1 tablet (30 mg total) by mouth at bedtime. For depression 04/25/17  Yes Lindell Spar I, NP  oxybutynin (DITROPAN) 5 MG tablet Take 5 mg by mouth 3 (three) times daily.  01/03/18  Yes [provider]  polyethylene glycol powder (GLYCOLAX/MIRALAX) powder Take 17 g by mouth daily. 01/17/18  Yes Carlisle Cater, PA-C  potassium chloride SA (K-DUR,KLOR-CON) 20 MEQ tablet Take 1 tablet (20 mEq total) by mouth daily. 12/14/17  Yes Roma Schanz R, DO  risperiDONE (RISPERDAL) 2 MG tablet Take 1 tablet (2 mg total) by mouth at bedtime. For mood control 04/25/17  Yes Nwoko, Herbert Pun I, NP  ondansetron (ZOFRAN) 4 MG tablet TAKE 1-2 TABLET BY MOUTH EVERY 8 HOURS AS NEEDED FOR NAUSEA AND VOMITING. 08/09/18   Carollee Herter, Alferd Apa, DO  SUMAtriptan (IMITREX) 50 MG tablet Take 1 tablet (50 mg total) by mouth every 2 (two) hours as needed for migraine (maximum 2 tabs in 24 hours). May repeat in 2 hours if headache persists or recurs. 08/24/18   Noemi Chapel, MD  amitriptyline (ELAVIL) 25 MG tablet 2 tablets by mouth at bedtime   06/21/11  [provider]  clidinium-chlordiazePOXIDE  (LIBRAX) 2.5-5 MG per capsule 2 capsules by mouth every morning and 1 at bedtime   06/21/11  [provider]    Family History Family History  Problem Relation Age of Onset  . Heart disease Father   . Asthma Maternal Grandmother   . Emphysema Maternal Grandfather   . Cancer Maternal Grandfather        Lung Cancer  . Cancer Other        Lung Cancer-Aunt  . Colon cancer Neg Hx   . Esophageal cancer Neg Hx   . Rectal cancer Neg Hx   . Stomach cancer Neg Hx   . Thyroid disease Neg Hx     Social History Social History   Tobacco Use  . Smoking status: Current Every Day Smoker    Packs/day: 1.00    Years: 14.00    Pack years: 14.00    Types: Cigarettes  . Smokeless tobacco: Never Used  . Tobacco comment: trying to quit  Substance Use Topics  . Alcohol use: Not Currently    Alcohol/week: 0.0 standard drinks    Comment: rare wine   . Drug use: Yes    Types: Marijuana    Comment: once a month     Allergies   Abilify [aripiprazole]; Metoclopramide hcl; Propoxyphene; Tramadol; Ambien [zolpidem tartrate]; Eszopiclone; Amitriptyline; Metoclopramide; Other; Varenicline; Buprenorphine hcl; Demerol [meperidine]; Emetrol; Morphine and related; and Propoxyphene n-acetaminophen   Review of Systems Review of Systems  All other systems reviewed and are negative.    Physical Exam Updated Vital Signs BP 109/82   Pulse 64   Temp 98 F (36.7 C) (Oral)   Resp 20   Ht 1.626 m (5\' 4" )   Wt 54.9 kg   SpO2 94%   BMI 20.77 kg/m   Physical Exam Vitals signs and nursing note reviewed.  Constitutional:      General: She is not in acute distress.    Appearance: She is well-developed.  HENT:     Head: Normocephalic and atraumatic.     Comments: Normal-appearing head, no tenderness over the sinuses over the temporal arteries    Mouth/Throat:  Pharynx: No oropharyngeal exudate.  Eyes:     General: No scleral icterus.       Right eye: No discharge.        Left eye: No  discharge.     Conjunctiva/sclera: Conjunctivae normal.     Pupils: Pupils are equal, round, and reactive to light.  Neck:     Musculoskeletal: Normal range of motion and neck supple.     Thyroid: No thyromegaly.     Vascular: No JVD.  Cardiovascular:     Rate and Rhythm: Normal rate and regular rhythm.     Heart sounds: Normal heart sounds. No murmur. No friction rub. No gallop.   Pulmonary:     Effort: Pulmonary effort is normal. No respiratory distress.     Breath sounds: Normal breath sounds. No wheezing or rales.  Abdominal:     General: Bowel sounds are normal. There is no distension.     Palpations: Abdomen is soft. There is no mass.     Tenderness: There is no abdominal tenderness.  Musculoskeletal: Normal range of motion.        General: No tenderness.  Lymphadenopathy:     Cervical: No cervical adenopathy.  Skin:    General: Skin is warm and dry.     Findings: No erythema or rash.  Neurological:     Mental Status: She is alert.     Coordination: Coordination normal.     Comments: Speech is clear, cranial nerves III through XII are intact, memory is intact, strength is normal in all 4 extremities including grips, sensation is intact to light touch and pinprick in all 4 extremities. Coordination as tested by finger-nose-finger is normal, no limb ataxia. Normal gait, normal reflexes at the patellar tendons bilaterally  Psychiatric:        Behavior: Behavior normal.      ED Treatments / Results  Labs (all labs ordered are listed, but only abnormal results are displayed) Labs Reviewed  CSF CELL COUNT WITH DIFFERENTIAL - Abnormal; Notable for the following components:      Result Value   RBC Count, CSF 16 (*)    All other components within normal limits  CSF CELL COUNT WITH DIFFERENTIAL - Abnormal; Notable for the following components:   RBC Count, CSF 1 (*)    All other components within normal limits  CBC WITH DIFFERENTIAL/PLATELET - Abnormal; Notable for the  following components:   Lymphs Abs 4.7 (*)    All other components within normal limits  COMPREHENSIVE METABOLIC PANEL - Abnormal; Notable for the following components:   CO2 21 (*)    Glucose, Bld 142 (*)    Total Bilirubin 0.2 (*)    All other components within normal limits  CSF CULTURE  GLUCOSE, CSF  PROTEIN, CSF  PROTIME-INR  SALICYLATE LEVEL  ACETAMINOPHEN LEVEL  VDRL, CSF  ARBOVIRUS IGG, CSF  CRYPTOCOCCAL ANTIGEN, CSF  HERPES SIMPLEX VIRUS(HSV) DNA BY PCR    EKG None  Radiology Ct Head Wo Contrast  Result Date: 08/23/2018 CLINICAL DATA:  Migraine EXAM: CT HEAD WITHOUT CONTRAST TECHNIQUE: Contiguous axial images were obtained from the base of the skull through the vertex without intravenous contrast. COMPARISON:  01/24/2018 FINDINGS: Brain: No evidence of acute infarction, hemorrhage, hydrocephalus, extra-axial collection or mass lesion/mass effect. Vascular: No hyperdense vessel or unexpected calcification. Skull: Normal. Negative for fracture or focal lesion. Sinuses/Orbits: No acute finding. Other: None. IMPRESSION: No acute intracranial pathology. No non-contrast CT findings to explain headache. Electronically Signed  By: Eddie Candle M.D.   On: 08/23/2018 15:36    Procedures .Lumbar Puncture Date/Time: 08/24/2018 3:28 PM Performed by: Noemi Chapel, MD Authorized by: Noemi Chapel, MD   Consent:    Consent obtained:  Written and verbal   Consent given by:  Patient   Risks discussed:  Bleeding, infection, headache and pain   Alternatives discussed:  No treatment Universal protocol:    Procedure explained and questions answered to patient or proxy's satisfaction: yes     Relevant documents present and verified: yes     Test results available and properly labeled: yes     Imaging studies available: yes     Required blood products, implants, devices, and special equipment available: yes     Immediately prior to procedure a time out was called: yes     Site/side  marked: yes     Patient identity confirmed:  Verbally with patient Pre-procedure details:    Procedure purpose:  Diagnostic   Preparation: Patient was prepped and draped in usual sterile fashion   Anesthesia (see MAR for exact dosages):    Anesthesia method:  Local infiltration   Local anesthetic:  Lidocaine 1% WITH epi Procedure details:    Lumbar space:  L3-L4 interspace   Patient position:  R lateral decubitus   Needle gauge:  20   Needle type:  Diamond point   Needle length (in):  3.5   Ultrasound guidance: no     Number of attempts:  2   Fluid appearance:  Clear   Tubes of fluid:  4   Total volume (ml):  6.5 Post-procedure:    Puncture site:  Adhesive bandage applied   Patient tolerance of procedure:  Tolerated well, no immediate complications   (including critical care time)  Medications Ordered in ED Medications  0.9 %  sodium chloride infusion (250 mL/hr Intravenous New Bag/Given 08/24/18 1400)  prochlorperazine (COMPAZINE) injection 10 mg (10 mg Intravenous Given 08/24/18 1359)  diphenhydrAMINE (BENADRYL) injection 25 mg (25 mg Intravenous Given 08/24/18 1359)  HYDROmorphone (DILAUDID) injection 0.5 mg (0.5 mg Intravenous Given 08/24/18 1440)  valproate (DEPACON) 500 mg in dextrose 5 % 50 mL IVPB (0 mg Intravenous Stopped 08/24/18 1625)  ondansetron (ZOFRAN) injection 4 mg (4 mg Intravenous Given 08/24/18 1620)     Initial Impression / Assessment and Plan / ED Course  I have reviewed the triage vital signs and the nursing notes.  Pertinent labs & imaging results that were available during my care of the patient were reviewed by me and considered in my medical decision making (see chart for details).  Clinical Course as of Aug 23 1745  Sat Aug 24, 3974  7341 Salicylate Lvl: 93.7 [BM]  1620 Acetaminophen (Tylenol), S: 23 [BM]    Clinical Course User Index [BM] Noemi Chapel, MD       The patient's presentation is concerning for the need for follow-up with a lumbar  unsure since the advanced imaging of her brain showed no signs of acute findings.  I would consider subarachnoid hemorrhage as well as infection, meningitis, encephalitis though less likely given normal mental status and exam.  She reports a blurred vision but has no diplopia and normal cranial nerves otherwise.  Patient agreeable to treatment with medications including Compazine, Benadryl, IV fluids, hold NSAIDs until lumbar puncture complete.  We will also get salicylate and acetaminophen levels given her use of excessive amounts of pain medication.  It is unclear what the exact preparation of Excedrin was  After the patient's lumbar puncture was completed lab started to show no leukocytosis, no anemia, no electrolyte abnormalities, acetaminophen level of 23 and a salicylate level of 10, no signs of liver dysfunction, renal dysfunction or abnormalities in the INR.  CSF studies pending at this time.  She has required re-dosing of medications and Dilaudid was given.  I have also ordered Depakote due to the poor tolerance of these medicines thus far.  Thankfully she is neurologically and hemodynamically stable.  The patient was counseled on her over-the-counter medication use, she has been counseled to avoid Excedrin, I have given her a prescription for Imitrex in hopes that this may give her some relief as well as referral to neurology.  I reexamined the patient at 5:45 and witnessed her walking back and forth to the bathroom.  She is bright, happy and states that her headache is almost completely relieved and she feels great.  She is concerned that it will come back and thus she will be referred to neurology.  Patient agreeable, vital signs stable and reassuring.    Final Clinical Impressions(s) / ED Diagnoses   Final diagnoses:  Bad headache    ED Discharge Orders         Ordered    SUMAtriptan (IMITREX) 50 MG tablet  Every 2 hours PRN     08/24/18 1743           Noemi Chapel, MD  08/24/18 1747

## 2018-08-24 NOTE — ED Triage Notes (Signed)
Pt with HA for a week, seen at [facility] on the 14th and seen at pcp yesterday.

## 2018-08-24 NOTE — ED Notes (Signed)
Pt tolerated spinal tap well

## 2018-08-24 NOTE — Discharge Instructions (Signed)
Your exam today was reassuring, the x-rays that you have had of your brain have been reassuring and the spinal tap which we did was also reassuring and there was no signs of infection or bleeding.  Please take the following medications exactly as prescribed  Ibuprofen, up to 600 mg 3 times a day if you are not allergic to this medication  Imitrex, 50 mg immediately and repeat again in 2 hours if still having a headache, maximum doses 2 pills in 1 day.  Return to the emergency department for severe or worsening symptoms.  Thank you for letting us take care of you today!  Please obtain all of your results from medical records or have your doctors office obtain the results - share them with your doctor - you should be seen at your doctors office in the next 2 days. Call today to arrange your follow up. Take the medications as prescribed. Please review all of the medicines and only take them if you do not have an allergy to them. Please be aware that if you are taking birth control pills, taking other prescriptions, ESPECIALLY ANTIBIOTICS may make the birth control ineffective - if this is the case, either do not engage in sexual activity or use alternative methods of birth control such as condoms until you have finished the medicine and your family doctor says it is OK to restart them. If you are on a blood thinner such as COUMADIN, be aware that any other medicine that you take may cause the coumadin to either work too much, or not enough - you should have your coumadin level rechecked in next 7 days if this is the case.  ?  It is also a possibility that you have an allergic reaction to any of the medicines that you have been prescribed - Everybody reacts differently to medications and while MOST people have no trouble with most medicines, you may have a reaction such as nausea, vomiting, rash, swelling, shortness of breath. If this is the case, please stop taking the medicine immediately and contact your  physician.   If you were given a medication in the ED such as percocet, vicodin, or morphine, be aware that these medicines are sedating and may change your ability to take care of yourself adequately for several hours after being given this medicines - you should not drive or take care of small children if you were given this medicine in the Emergency Department or if you have been prescribed these types of medicines. ?   You should return to the ER IMMEDIATELY if you develop severe or worsening symptoms.

## 2018-08-25 ENCOUNTER — Emergency Department (HOSPITAL_BASED_OUTPATIENT_CLINIC_OR_DEPARTMENT_OTHER)
Admission: EM | Admit: 2018-08-25 | Discharge: 2018-08-25 | Disposition: A | Discharge status: Home / Self Care | Payer: BLUE CROSS/BLUE SHIELD | Attending: Emergency Medicine | Admitting: Emergency Medicine

## 2018-08-25 ENCOUNTER — Encounter (HOSPITAL_BASED_OUTPATIENT_CLINIC_OR_DEPARTMENT_OTHER): Payer: Self-pay | Admitting: Emergency Medicine

## 2018-08-25 ENCOUNTER — Other Ambulatory Visit: Payer: Self-pay

## 2018-08-25 DIAGNOSIS — I10 Essential (primary) hypertension: Secondary | ICD-10-CM | POA: Insufficient documentation

## 2018-08-25 DIAGNOSIS — Z950 Presence of cardiac pacemaker: Secondary | ICD-10-CM | POA: Insufficient documentation

## 2018-08-25 DIAGNOSIS — F17210 Nicotine dependence, cigarettes, uncomplicated: Secondary | ICD-10-CM | POA: Diagnosis not present

## 2018-08-25 DIAGNOSIS — Z79899 Other long term (current) drug therapy: Secondary | ICD-10-CM | POA: Diagnosis not present

## 2018-08-25 DIAGNOSIS — J449 Chronic obstructive pulmonary disease, unspecified: Secondary | ICD-10-CM | POA: Diagnosis not present

## 2018-08-25 DIAGNOSIS — G43909 Migraine, unspecified, not intractable, without status migrainosus: Secondary | ICD-10-CM | POA: Insufficient documentation

## 2018-08-25 DIAGNOSIS — Z8541 Personal history of malignant neoplasm of cervix uteri: Secondary | ICD-10-CM | POA: Diagnosis not present

## 2018-08-25 DIAGNOSIS — F1210 Cannabis abuse, uncomplicated: Secondary | ICD-10-CM | POA: Diagnosis not present

## 2018-08-25 DIAGNOSIS — R51 Headache: Secondary | ICD-10-CM | POA: Diagnosis not present

## 2018-08-25 LAB — CBC
HCT: 43 % (ref 36.0–46.0)
Hemoglobin: 13.9 g/dL (ref 12.0–15.0)
MCH: 30.8 pg (ref 26.0–34.0)
MCHC: 32.3 g/dL (ref 30.0–36.0)
MCV: 95.1 fL (ref 80.0–100.0)
Platelets: 399 10*3/uL (ref 150–400)
RBC: 4.52 MIL/uL (ref 3.87–5.11)
RDW: 13.7 % (ref 11.5–15.5)
WBC: 10 10*3/uL (ref 4.0–10.5)
nRBC: 0 % (ref 0.0–0.2)

## 2018-08-25 LAB — BASIC METABOLIC PANEL
Anion gap: 9 (ref 5–15)
BUN: <5 mg/dL — ABNORMAL LOW (ref 6–20)
CO2: 23 mmol/L (ref 22–32)
Calcium: 9.6 mg/dL (ref 8.9–10.3)
Chloride: 110 mmol/L (ref 98–111)
Creatinine, Ser: 0.85 mg/dL (ref 0.44–1.00)
GFR calc Af Amer: >60 mL/min (ref >60)
GFR calc non Af Amer: >60 mL/min (ref >60)
Glucose, Bld: 87 mg/dL (ref 70–99)
Potassium: 3.9 mmol/L (ref 3.5–5.1)
Sodium: 142 mmol/L (ref 135–145)

## 2018-08-25 MED ORDER — PROCHLORPERAZINE EDISYLATE 10 MG/2ML IJ SOLN
10.0000 mg | Freq: Once | INTRAMUSCULAR | Status: AC
  Administered 2018-08-25: 10 mg via INTRAVENOUS
  Filled 2018-08-25: qty 2

## 2018-08-25 MED ORDER — HYDROMORPHONE HCL 1 MG/ML IJ SOLN
1.0000 mg | Freq: Once | INTRAMUSCULAR | Status: AC
  Administered 2018-08-25: 22:00:00 1 mg via INTRAVENOUS
  Filled 2018-08-25: qty 1

## 2018-08-25 MED ORDER — SODIUM CHLORIDE 0.9 % IV BOLUS
1000.0000 mL | Freq: Once | INTRAVENOUS | Status: AC
  Administered 2018-08-25: 1000 mL via INTRAVENOUS

## 2018-08-25 MED ORDER — KETOROLAC TROMETHAMINE 30 MG/ML IJ SOLN
30.0000 mg | Freq: Once | INTRAMUSCULAR | Status: AC
  Administered 2018-08-25: 30 mg via INTRAVENOUS
  Filled 2018-08-25: qty 1

## 2018-08-25 MED ORDER — ONDANSETRON HCL 4 MG/2ML IJ SOLN
4.0000 mg | Freq: Once | INTRAMUSCULAR | Status: AC
  Administered 2018-08-25: 22:00:00 4 mg via INTRAVENOUS
  Filled 2018-08-25: qty 2

## 2018-08-25 NOTE — ED Triage Notes (Addendum)
"   Migraine" for last 9 days, this is 3rd visit in the last 3 days in ED's for the same . Had a LP done last night and a CT last week

## 2018-08-25 NOTE — ED Provider Notes (Signed)
[facility] EMERGENCY DEPARTMENT Provider Note   CSN: [id number] Arrival date & time: 08/25/18  2056    History   Chief Complaint Chief Complaint  Patient presents with  . Migraine    HPI [name] is a 43 y.o. female.     HPI 43 year old female presents the emergency department with severe headache over the past 9 days.  She has been seen in the ER twice for this and by her primary care physician.  She has had a CT scan which demonstrates no acute pathology.  She underwent lumbar puncture yesterday by Dr. Sabra Heck which demonstrated no significant abnormality.  CSF has had no growth to date.  She reports no history of significant headaches.  She denies fevers and chills.  No neck stiffness.  No altered mental status.  She is on Tylenol for chronically and reports this has not been helping with her pain.  She denies chest pain.  No abdominal pain.  Nausea without vomiting.  Denies diarrhea.      Past Medical History:  Diagnosis Date  . 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 Anal fissure 03/11/2009  . Anemia   . Anxiety   . Anxiety and depression   . ARTHRITIS 08/31/2007  . Arthritis   . Asthma   . 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 Bipolar 1 disorder ([place])   . Bowel obstruction ([place])   . BRONCHITIS, RECURRENT 08/23/2009   Asthmatic Bronchitis-Dr. Melvyn Novas.....-HFA 75% 12/04/2008>75% 02/05/2009>75% 08/04/2009 -PFT's 01/04/2009 2.56 (86%) ratio 75, no resp to B2 and DLC0 67% > 80 after correction   . Cancer (HCC)    cervical cancer  . Chronic interstitial cystitis 03/11/2009  . Chronic nausea   . Chronic pain   . Colon polyps   . COLONIC POLYPS, HX OF 07/25/2006   ADENOMATOUS POLYP  . COPD (chronic obstructive pulmonary disease) ([place])   . DEPRESSION 08/31/2007  . Endometriosis   . FIBROMYALGIA 08/31/2007  . Fibromyalgia   . GERD 02/05/2009  . Hyperlipidemia   . HYPERTENSION 08/31/2007  . IBS 03/11/2009  . Internal hemorrhoids   . Migraine headache   .  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 PONV (postoperative nausea and vomiting)   . RECTAL BLEEDING 03/11/2009  . Seizures ([place])    been about 1 year since last seisure per pt  . SLEEP APNEA 08/31/2007  . Substance abuse ([place])   . Thyroid disease   . Uterine cyst     Patient Active Problem List   Diagnosis Date Noted  . Intractable vomiting 08/09/2018  . Compression fracture of fifth lumbar vertebra (HCC) 02/07/2018  . Altered mental status 02/07/2018  . Generalized weakness 01/24/2018  . MDD (major depressive disorder) 04/23/2017  . Substance abuse ([place]) 04/19/2017  . Hyperthyroidism 02/17/2015  . Anxiety 01/28/2015  . Clinical depression 01/28/2015  . COPD with acute exacerbation ([place]) 11/26/2013  . Vaginitis and vulvovaginitis 11/26/2013  . Recurrent pneumonia 02/02/2013  . Left arm pain 01/13/2013  . Left arm pain 01/13/2013    Class: Acute  . Dizziness and giddiness 01/13/2013  . Acute bronchitis 12/07/2012  . Rib pain 12/07/2012  . Insomnia 08/26/2012  . Diarrhea 06/19/2012  . Heme positive stool 06/19/2012  . Bloating 06/19/2012  . Nausea alone 06/19/2012  . Bladder retention 04/24/2012  . Tremor 03/05/2012  . Dysuria 03/05/2012  . Failure to thrive in adult 06/21/2011  . Airway hyperreactivity 03/08/2011  . Back pain, chronic 03/08/2011  . Chronic obstructive pulmonary disease ([place]) 03/08/2011  . Acid  reflux 03/08/2011  . Cystitis 01/22/2011  . Bilateral hand numbness 01/13/2011  . WEIGHT LOSS 06/24/2010  . RIB PAIN, LEFT SIDED 05/04/2010  . ABDOMINAL PAIN, LEFT UPPER QUADRANT 05/04/2010  . Unspecified otitis media 10/26/2009  . BRONCHITIS, RECURRENT 08/23/2009  . URI, ACUTE 08/04/2009  . FATIGUE 07/06/2009  . IBS 03/11/2009  . ANAL FISSURE 03/11/2009  . RECTAL BLEEDING 03/11/2009  . Chronic interstitial cystitis 03/11/2009  . COLONIC POLYPS, HX OF 03/11/2009  . GERD 02/05/2009  . SMOKER 12/04/2008  . chronic asthma poorly controlled 12/04/2008  . ADENOMATOUS  COLONIC POLYP 08/31/2007  . 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 Bipolar disorder ([place]) 08/31/2007  . HYPERTENSION 08/31/2007  . EMPHYSEMA 08/31/2007  . 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 ARTHRITIS 08/31/2007  . FIBROMYALGIA 08/31/2007  . SLEEP APNEA 08/31/2007    Past Surgical History:  Procedure Laterality Date  . ABDOMINAL HYSTERECTOMY    . bladder stretching x6    . BLADDER SURGERY     stimulator placed and stretching   . CHOLECYSTECTOMY    . COLONOSCOPY    . COLONOSCOPY WITH PROPOFOL N/A 09/03/2014   Procedure: COLONOSCOPY WITH PROPOFOL;  Surgeon: Milus Banister, MD;  Location: [facility];  Service: Endoscopy;  Laterality: N/A;  . interstitial cystitis    . PACEMAKER INSERTION     in hip for interstitial cystitis  . removal of uterine cyst and scrapped uterus    . replaced bladder pacemaker       OB History    Gravida  3   Para  2   Term      Preterm      AB  1   Living  2     SAB  1   TAB  0   Ectopic  0   Multiple  0   Live Births  2            Home Medications    Prior to Admission medications   Medication Sig Start Date End Date Taking? Authorizing Provider  albuterol (PROVENTIL HFA;VENTOLIN HFA) 108 (90 Base) MCG/ACT inhaler Inhale 2 puffs into the lungs every 6 (six) hours as needed for wheezing or shortness of breath. 04/25/17   Lindell Spar I, NP  budesonide-formoterol (SYMBICORT) 80-4.5 MCG/ACT inhaler Inhale 2 puffs into the lungs 2 (two) times daily.  12/13/17   [provider]  ciprofloxacin (CIPRO) 250 MG tablet Take 1 tablet (250 mg total) by mouth 2 (two) times daily. 08/22/18   [name], DO  cyclobenzaprine (FLEXERIL) 10 MG tablet Take 1 tablet (10 mg total) by mouth 3 (three) times daily as needed for muscle spasms. 08/23/18   [name], DO  FLUoxetine (PROZAC) 10 MG capsule Take 3 capsules (30 mg total) by mouth at bedtime. For mood control 04/25/17   Lindell Spar I, NP  gabapentin (NEURONTIN) 300 MG  capsule Take 1 capsule (300 mg total) by mouth 2 (two) times daily. For agitation 01/25/18   Barton Dubois, MD  lamoTRIgine (LAMICTAL) 100 MG tablet Take 100 mg by mouth 2 (two) times daily.  01/08/18   [provider]  mirtazapine (REMERON SOL-TAB) 30 MG disintegrating tablet Take 1 tablet (30 mg total) by mouth at bedtime. For depression 04/25/17   Lindell Spar I, NP  ondansetron (ZOFRAN) 4 MG tablet TAKE 1-2 TABLET BY MOUTH EVERY 8 HOURS AS NEEDED FOR NAUSEA AND VOMITING. 08/09/18   Carollee Herter, Alferd Apa, DO  oxybutynin (DITROPAN) 5 MG tablet Take 5  mg by mouth 3 (three) times daily.  01/03/18   [provider]  polyethylene glycol powder (GLYCOLAX/MIRALAX) powder Take 17 g by mouth daily. 01/17/18   Carlisle Cater, PA-C  potassium chloride SA (K-DUR,KLOR-CON) 20 MEQ tablet Take 1 tablet (20 mEq total) by mouth daily. 12/14/17   [name], DO  risperiDONE (RISPERDAL) 2 MG tablet Take 1 tablet (2 mg total) by mouth at bedtime. For mood control 04/25/17   Lindell Spar I, NP  SUMAtriptan (IMITREX) 50 MG tablet Take 1 tablet (50 mg total) by mouth every 2 (two) hours as needed for migraine (maximum 2 tabs in 24 hours). May repeat in 2 hours if headache persists or recurs. 08/24/18   Noemi Chapel, MD  amitriptyline (ELAVIL) 25 MG tablet 2 tablets by mouth at bedtime   06/21/11  [provider]  clidinium-chlordiazePOXIDE (LIBRAX) 2.5-5 MG per capsule 2 capsules by mouth every morning and 1 at bedtime   06/21/11  [provider]    Family History Family History  Problem Relation Age of Onset  . Heart disease Father   . Asthma Maternal Grandmother   . Emphysema Maternal Grandfather   . Cancer Maternal Grandfather        Lung Cancer  . Cancer Other        Lung Cancer-Aunt  . Colon cancer Neg Hx   . Esophageal cancer Neg Hx   . Rectal cancer Neg Hx   . Stomach cancer Neg Hx   . Thyroid disease Neg Hx     Social History Social History   Tobacco Use  .  Smoking status: Current Every Day Smoker    Packs/day: 1.00    Years: 14.00    Pack years: 14.00    Types: Cigarettes  . Smokeless tobacco: Never Used  . Tobacco comment: trying to quit  Substance Use Topics  . Alcohol use: Not Currently    Alcohol/week: 0.0 standard drinks    Comment: rare wine   . Drug use: Yes    Types: Marijuana    Comment: once a month     Allergies   Abilify [aripiprazole]; Metoclopramide hcl; Propoxyphene; Tramadol; Ambien [zolpidem tartrate]; Eszopiclone; Amitriptyline; Metoclopramide; Other; Varenicline; Buprenorphine hcl; Demerol [meperidine]; Emetrol; Morphine and related; and Propoxyphene n-acetaminophen   Review of Systems Review of Systems  All other systems reviewed and are negative.    Physical Exam Updated Vital Signs BP 116/82 (BP Location: Right Arm)   Pulse 99   Temp 98 F (36.7 C) (Oral)   Resp 18   Ht 5\' 4"  (1.626 m)   Wt 54.4 kg   SpO2 97%   BMI 20.59 kg/m   Physical Exam Vitals signs and nursing note reviewed.  Constitutional:      General: She is not in acute distress.    Appearance: She is well-developed.  HENT:     Head: Normocephalic and atraumatic.  Neck:     Musculoskeletal: Normal range of motion.  Cardiovascular:     Rate and Rhythm: Normal rate and regular rhythm.     Heart sounds: Normal heart sounds.  Pulmonary:     Effort: Pulmonary effort is normal.     Breath sounds: Normal breath sounds.  Abdominal:     General: There is no distension.     Palpations: Abdomen is soft.     Tenderness: There is no abdominal tenderness.  Musculoskeletal: Normal range of motion.  Skin:    General: Skin is warm and dry.  Neurological:  Mental Status: She is alert and oriented to person, place, and time.  Psychiatric:        Judgment: Judgment normal.      ED Treatments / Results  Labs (all labs ordered are listed, but only abnormal results are displayed) Labs Reviewed  BASIC METABOLIC PANEL - Abnormal;  Notable for the following components:      Result Value   BUN <5 (*)    All other components within normal limits  CBC    EKG None  Radiology No results found.  Procedures Procedures (including critical care time)  Medications Ordered in ED Medications  HYDROmorphone (DILAUDID) injection 1 mg (1 mg Intravenous Given 08/25/18 2152)  ondansetron (ZOFRAN) injection 4 mg (4 mg Intravenous Given 08/25/18 2151)  prochlorperazine (COMPAZINE) injection 10 mg (10 mg Intravenous Given 08/25/18 2159)  ketorolac (TORADOL) 30 MG/ML injection 30 mg (30 mg Intravenous Given 08/25/18 2153)  sodium chloride 0.9 % bolus 1,000 mL (1,000 mLs Intravenous New Bag/Given 08/25/18 2149)     Initial Impression / Assessment and Plan / ED Course  I have reviewed the triage vital signs and the nursing notes.  Pertinent labs & imaging results that were available during my care of the patient were reviewed by me and considered in my medical decision making (see chart for details).        Patient feels better this time.  Work-up reviewed including CT imaging and CSF from previous visits.  She does not need additional work-up at this time.  She feels better.  She will need outpatient neurology follow-up and an MRI.  She understands the importance of close follow-up with her primary care physician and a neurologist.  Final Clinical Impressions(s) / ED Diagnoses   Final diagnoses:  Migraine without status migrainosus, not intractable, unspecified migraine type    ED Discharge Orders    None       Jola Schmidt, MD 08/25/18 2302

## 2018-08-26 ENCOUNTER — Other Ambulatory Visit: Payer: Self-pay | Admitting: Family Medicine

## 2018-08-26 ENCOUNTER — Telehealth: Payer: Self-pay | Admitting: *Deleted

## 2018-08-26 DIAGNOSIS — G4489 Other headache syndrome: Secondary | ICD-10-CM

## 2018-08-26 LAB — VDRL, CSF: VDRL Quant, CSF: NONREACTIVE

## 2018-08-26 NOTE — Telephone Encounter (Signed)
[name] is unable to do MRI on patient because of the stimulator that she has going to her bladder.  They told her to call [facility] and they are unable to do it.  I advised patient that we did put in a referral for neurology.

## 2018-08-26 NOTE — Telephone Encounter (Signed)
Patient notified to await appt for neurology

## 2018-08-26 NOTE — Telephone Encounter (Signed)
Ok--- hopefully they can see her quickly

## 2018-08-27 LAB — HSV DNA BY PCR (REFERENCE LAB)
HSV 1 DNA: NEGATIVE
HSV 2 DNA: NEGATIVE

## 2018-08-27 LAB — CSF CULTURE W GRAM STAIN
Culture: NO GROWTH
Special Requests: NORMAL

## 2018-08-28 ENCOUNTER — Ambulatory Visit (INDEPENDENT_AMBULATORY_CARE_PROVIDER_SITE_OTHER): Payer: BLUE CROSS/BLUE SHIELD | Admitting: Family Medicine

## 2018-08-28 ENCOUNTER — Telehealth: Payer: Self-pay | Admitting: Family Medicine

## 2018-08-28 ENCOUNTER — Other Ambulatory Visit: Payer: Self-pay

## 2018-08-28 ENCOUNTER — Encounter: Payer: Self-pay | Admitting: Family Medicine

## 2018-08-28 DIAGNOSIS — G43909 Migraine, unspecified, not intractable, without status migrainosus: Secondary | ICD-10-CM

## 2018-08-28 MED ORDER — TOPIRAMATE 25 MG PO TABS: ORAL_TABLET | ORAL | 0 refills | Status: DC

## 2018-08-28 MED ORDER — SUMATRIPTAN SUCCINATE 50 MG PO TABS: 50.0000 mg | ORAL_TABLET | ORAL | 0 refills | Status: DC | PRN

## 2018-08-28 NOTE — Telephone Encounter (Signed)
Copied from [place] [id number]. Topic: General - Other >> Aug 28, 2018 11:43 AM Oneta Rack wrote:  Relation to pt: self  Call back [phone] Pharmacy: [facility], [place] [phone] (Phone) [phone] (Fax   Reason for call:  Patient requesting PCP to prescribe SUMAtriptan (IMITREX) 50 MG tablet for head aches. Patient was seen in the ED on 08/25/2018 and was referred to neuro. Patient has a scheduled appointment with specialist on 09/03/2018 with Pieter Partridge, DO. Patient would like to know if PCP can prescribed medication to hold her over until appointment, please advise

## 2018-08-28 NOTE — Progress Notes (Signed)
Virtual Visit via Video Note  I connected with [name] on 08/28/18 at  2:20 PM EDT by a video enabled telemedicine application and verified that I am speaking with the correct person using two identifiers.  Location: Patient: home  Provider: office    I discussed the limitations of evaluation and management by telemedicine and the availability of in person appointments. The patient expressed understanding and agreed to proceed.  History of Present Illness: Pt is home f/u from er .  She was given immitrex for her headaches which helps but not completely and she has to take 2x a day every day    Her app with neuro is next week .   Er visit reviewed  Mri unable to be done due to bladder pacemaker  Observations/Objective: .no vitals obtained  Pt in NAD  Assessment and Plan: 1. Migraine without status migrainosus, not intractable, unspecified migraine type Neuro pending  Titrate topamax con't immitrex  - topiramate (TOPAMAX) 25 MG tablet; 1 po qd x 1 week then 1 po bid x 1 week then 1 po qam and 2 po qpm  Dispense: 42 tablet; Refill: 0 - SUMAtriptan (IMITREX) 50 MG tablet; Take 1 tablet (50 mg total) by mouth every 2 (two) hours as needed for migraine (maximum 2 tabs in 24 hours). May repeat in 2 hours if headache persists or recurs.  Dispense: 10 tablet; Refill: 0   Follow Up Instructions:    I discussed the assessment and treatment plan with the patient. The patient was provided an opportunity to ask questions and all were answered. The patient agreed with the plan and demonstrated an understanding of the instructions.   The patient was advised to call back or seek an in-person evaluation if the symptoms worsen or if the condition fails to improve as anticipated.  I provided 15 minutes of non-face-to-face time during this encounter.   [name], DO

## 2018-08-28 NOTE — Telephone Encounter (Signed)
Needs ED f/u- virtual visit.

## 2018-08-28 NOTE — Telephone Encounter (Signed)
Schedule pt for today with provider 08-28-2018. Done.

## 2018-08-30 ENCOUNTER — Encounter: Payer: Self-pay | Admitting: Family Medicine

## 2018-08-30 ENCOUNTER — Ambulatory Visit (INDEPENDENT_AMBULATORY_CARE_PROVIDER_SITE_OTHER): Payer: BLUE CROSS/BLUE SHIELD | Admitting: Family Medicine

## 2018-08-30 ENCOUNTER — Other Ambulatory Visit: Payer: Self-pay

## 2018-08-30 DIAGNOSIS — G43719 Chronic migraine without aura, intractable, without status migrainosus: Secondary | ICD-10-CM

## 2018-08-30 MED ORDER — TRAMADOL HCL 50 MG PO TABS: ORAL_TABLET | ORAL | 0 refills | Status: DC

## 2018-08-30 NOTE — Progress Notes (Signed)
Virtual Visit via Video Note  I connected with [name] on 08/30/18 at  1:45 PM EDT by a video enabled telemedicine application and verified that I am speaking with the correct person using two identifiers.  Location: Patient: home Provider: office   I discussed the limitations of evaluation and management by telemedicine and the availability of in person appointments. The patient expressed understanding and agreed to proceed.  History of Present Illness: Pt calling back because topamax is not working.  She has only taken it for 1 day.   No other complaints.  No new symptoms    Observations/Objective: Pt is in NAD Unable to obtain vitals  Assessment and Plan: 1. Intractable chronic migraine without aura and without status migrainosus Explained to pt it will take time for the topamax to work -- it normally will not work in 1 day Tramadol for acute pain--- if no better -- go back to er Neuro app is Tuesday  - traMADol (ULTRAM) 50 MG tablet; 1-2 po qd 6h prn  Dispense: 30 tablet; Refill: 0   Follow Up Instructions:    I discussed the assessment and treatment plan with the patient. The patient was provided an opportunity to ask questions and all were answered. The patient agreed with the plan and demonstrated an understanding of the instructions.   The patient was advised to call back or seek an in-person evaluation if the symptoms worsen or if the condition fails to improve as anticipated.  I provided 15 minutes of non-face-to-face time during this encounter.   [name], DO

## 2018-08-31 ENCOUNTER — Emergency Department (HOSPITAL_BASED_OUTPATIENT_CLINIC_OR_DEPARTMENT_OTHER)
Admission: EM | Admit: 2018-08-31 | Discharge: 2018-08-31 | Disposition: A | Discharge status: Home / Self Care | Payer: BLUE CROSS/BLUE SHIELD | Attending: Emergency Medicine | Admitting: Emergency Medicine

## 2018-08-31 ENCOUNTER — Other Ambulatory Visit: Payer: Self-pay

## 2018-08-31 ENCOUNTER — Encounter (HOSPITAL_BASED_OUTPATIENT_CLINIC_OR_DEPARTMENT_OTHER): Payer: Self-pay | Admitting: *Deleted

## 2018-08-31 DIAGNOSIS — R51 Headache: Secondary | ICD-10-CM | POA: Diagnosis not present

## 2018-08-31 DIAGNOSIS — R519 Headache, unspecified: Secondary | ICD-10-CM

## 2018-08-31 DIAGNOSIS — Z79899 Other long term (current) drug therapy: Secondary | ICD-10-CM | POA: Diagnosis not present

## 2018-08-31 DIAGNOSIS — J449 Chronic obstructive pulmonary disease, unspecified: Secondary | ICD-10-CM | POA: Insufficient documentation

## 2018-08-31 DIAGNOSIS — Z8541 Personal history of malignant neoplasm of cervix uteri: Secondary | ICD-10-CM | POA: Insufficient documentation

## 2018-08-31 DIAGNOSIS — F17210 Nicotine dependence, cigarettes, uncomplicated: Secondary | ICD-10-CM | POA: Insufficient documentation

## 2018-08-31 DIAGNOSIS — I10 Essential (primary) hypertension: Secondary | ICD-10-CM | POA: Insufficient documentation

## 2018-08-31 MED ORDER — PROCHLORPERAZINE EDISYLATE 10 MG/2ML IJ SOLN
10.0000 mg | Freq: Once | INTRAMUSCULAR | Status: AC
  Administered 2018-08-31: 10 mg via INTRAVENOUS
  Filled 2018-08-31: qty 2

## 2018-08-31 MED ORDER — DEXAMETHASONE 6 MG PO TABS
10.0000 mg | ORAL_TABLET | Freq: Once | ORAL | Status: AC
  Administered 2018-08-31: 10 mg via ORAL
  Filled 2018-08-31: qty 1

## 2018-08-31 MED ORDER — KETOROLAC TROMETHAMINE 15 MG/ML IJ SOLN
15.0000 mg | Freq: Once | INTRAMUSCULAR | Status: AC
  Administered 2018-08-31: 19:00:00 15 mg via INTRAVENOUS
  Filled 2018-08-31: qty 1

## 2018-08-31 MED ORDER — DIPHENHYDRAMINE HCL 50 MG/ML IJ SOLN
25.0000 mg | Freq: Once | INTRAMUSCULAR | Status: AC
  Administered 2018-08-31: 25 mg via INTRAVENOUS
  Filled 2018-08-31: qty 1

## 2018-08-31 NOTE — Discharge Instructions (Signed)
Follow up with your PCP.  Try to avoid pain medicines unless you need them.

## 2018-08-31 NOTE — ED Provider Notes (Signed)
[facility] EMERGENCY DEPARTMENT Provider Note   CSN: [id number] Arrival date & time: 08/31/18  1746    History   Chief Complaint Chief Complaint  Patient presents with  . Headache    HPI [name] is a 43 y.o. female.     43 yo F with a chief complaint of a headache.  Patient states is been pretty consistent for the past couple weeks but seems to get worse in certain scenarios.  She has been to the ED now 4 times for this.  Typically improves with a headache cocktail but does not go away.  She has a appointment with a neurologist on Tuesday.  She has been unable to get an MRI due to a bladder stimulator for her interstitial cystitis.  States that she woke up this morning with it having worsened.  She denies unilateral numbness or weakness denies difficulty with speech or swallowing.  Denies fever denies head trauma.  The history is provided by the patient.  Headache  Location: Left periorbital. Quality:  Sharp Radiates to:  Does not radiate Severity currently:  10/10 Severity at highest:  10/10 Onset quality:  Gradual Duration:  2 weeks Timing:  Constant Progression:  Worsening Chronicity:  Recurrent Similar to prior headaches: yes   Context: bright light and loud noise   Relieved by:  Nothing Worsened by:  Nothing Ineffective treatments:  None tried Associated symptoms: no congestion, no dizziness, no fever, no myalgias, no nausea and no vomiting     Past Medical History:  Diagnosis Date  . 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 Anal fissure 03/11/2009  . Anemia   . Anxiety   . Anxiety and depression   . ARTHRITIS 08/31/2007  . Arthritis   . Asthma   . 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 Bipolar 1 disorder ([place])   . Bowel obstruction ([place])   . BRONCHITIS, RECURRENT 08/23/2009   Asthmatic Bronchitis-Dr. Melvyn Novas.....-HFA 75% 12/04/2008>75% 02/05/2009>75% 08/04/2009 -PFT's 01/04/2009 2.56 (86%) ratio 75, no resp to B2 and DLC0 67% > 80 after correction    . Cancer (HCC)    cervical cancer  . Chronic interstitial cystitis 03/11/2009  . Chronic nausea   . Chronic pain   . Colon polyps   . COLONIC POLYPS, HX OF 07/25/2006   ADENOMATOUS POLYP  . COPD (chronic obstructive pulmonary disease) ([place])   . DEPRESSION 08/31/2007  . Endometriosis   . FIBROMYALGIA 08/31/2007  . Fibromyalgia   . GERD 02/05/2009  . Hyperlipidemia   . HYPERTENSION 08/31/2007  . IBS 03/11/2009  . Internal hemorrhoids   . Migraine headache   . 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 PONV (postoperative nausea and vomiting)   . RECTAL BLEEDING 03/11/2009  . Seizures ([place])    been about 1 year since last seisure per pt  . SLEEP APNEA 08/31/2007  . Substance abuse ([place])   . Thyroid disease   . Uterine cyst     Patient Active Problem List   Diagnosis Date Noted  . Intractable vomiting 08/09/2018  . Compression fracture of fifth lumbar vertebra (HCC) 02/07/2018  . Altered mental status 02/07/2018  . Generalized weakness 01/24/2018  . MDD (major depressive disorder) 04/23/2017  . Substance abuse ([place]) 04/19/2017  . Hyperthyroidism 02/17/2015  . Anxiety 01/28/2015  . Clinical depression 01/28/2015  . COPD with acute exacerbation ([place]) 11/26/2013  . Vaginitis and vulvovaginitis 11/26/2013  . Recurrent pneumonia 02/02/2013  . Left arm pain 01/13/2013  . Left arm pain 01/13/2013    Class: Acute  .  Dizziness and giddiness 01/13/2013  . Acute bronchitis 12/07/2012  . Rib pain 12/07/2012  . Insomnia 08/26/2012  . Diarrhea 06/19/2012  . Heme positive stool 06/19/2012  . Bloating 06/19/2012  . Nausea alone 06/19/2012  . Bladder retention 04/24/2012  . Tremor 03/05/2012  . Dysuria 03/05/2012  . Failure to thrive in adult 06/21/2011  . Airway hyperreactivity 03/08/2011  . Back pain, chronic 03/08/2011  . Chronic obstructive pulmonary disease ([place]) 03/08/2011  . Acid reflux 03/08/2011  . Cystitis 01/22/2011  . Bilateral hand numbness 01/13/2011  . WEIGHT LOSS 06/24/2010   . RIB PAIN, LEFT SIDED 05/04/2010  . ABDOMINAL PAIN, LEFT UPPER QUADRANT 05/04/2010  . Unspecified otitis media 10/26/2009  . BRONCHITIS, RECURRENT 08/23/2009  . URI, ACUTE 08/04/2009  . FATIGUE 07/06/2009  . IBS 03/11/2009  . ANAL FISSURE 03/11/2009  . RECTAL BLEEDING 03/11/2009  . Chronic interstitial cystitis 03/11/2009  . COLONIC POLYPS, HX OF 03/11/2009  . GERD 02/05/2009  . SMOKER 12/04/2008  . chronic asthma poorly controlled 12/04/2008  . 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 Bipolar disorder ([place]) 08/31/2007  . HYPERTENSION 08/31/2007  . EMPHYSEMA 08/31/2007  . 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 ARTHRITIS 08/31/2007  . FIBROMYALGIA 08/31/2007  . SLEEP APNEA 08/31/2007    Past Surgical History:  Procedure Laterality Date  . ABDOMINAL HYSTERECTOMY    . bladder stretching x6    . BLADDER SURGERY     stimulator placed and stretching   . CHOLECYSTECTOMY    . COLONOSCOPY    . COLONOSCOPY WITH PROPOFOL N/A 09/03/2014   Procedure: COLONOSCOPY WITH PROPOFOL;  Surgeon: Milus Banister, MD;  Location: [facility] ENDOS[facility];  Service: Endoscopy;  Laterality: N/A;  . interstitial cystitis    . PACEMAKER INSERTION     in hip for interstitial cystitis  . removal of uterine cyst and scrapped uterus    . replaced bladder pacemaker       OB History    Gravida  3   Para  2   Term      Preterm      AB  1   Living  2     SAB  1   TAB  0   Ectopic  0   Multiple  0   Live Births  2            Home Medications    Prior to Admission medications   Medication Sig Start Date End Date Taking? Authorizing Provider  albuterol (PROVENTIL HFA;VENTOLIN HFA) 108 (90 Base) MCG/ACT inhaler Inhale 2 puffs into the lungs every 6 (six) hours as needed for wheezing or shortness of breath. 04/25/17   Lindell Spar I, NP  budesonide-formoterol (SYMBICORT) 80-4.5 MCG/ACT inhaler Inhale 2 puffs into the lungs 2 (two) times daily.  12/13/17   [provider]  ciprofloxacin (CIPRO) 250 MG tablet Take 1 tablet (250 mg total) by mouth 2 (two) times daily. Patient not taking: Reported on 08/28/2018 08/22/18   Carollee Herter, Alferd Apa, DO  cyclobenzaprine (FLEXERIL) 10 MG tablet Take 1 tablet (10 mg total) by mouth 3 (three) times daily as needed for muscle spasms. 08/23/18   [name], DO  FLUoxetine (PROZAC) 10 MG capsule Take 3 capsules (30 mg total) by mouth at bedtime. For mood control 04/25/17   Lindell Spar I, NP  gabapentin (NEURONTIN) 300 MG capsule Take 1 capsule (300 mg total) by mouth 2 (two) times daily. For agitation 01/25/18   Barton Dubois,  MD  lamoTRIgine (LAMICTAL) 100 MG tablet Take 100 mg by mouth 2 (two) times daily.  01/08/18   [provider]  mirtazapine (REMERON SOL-TAB) 30 MG disintegrating tablet Take 1 tablet (30 mg total) by mouth at bedtime. For depression 04/25/17   Lindell Spar I, NP  ondansetron (ZOFRAN) 4 MG tablet TAKE 1-2 TABLET BY MOUTH EVERY 8 HOURS AS NEEDED FOR NAUSEA AND VOMITING. 08/09/18   Carollee Herter, Alferd Apa, DO  oxybutynin (DITROPAN) 5 MG tablet Take 5 mg by mouth 3 (three) times daily.  01/03/18   [provider]  polyethylene glycol powder (GLYCOLAX/MIRALAX) powder Take 17 g by mouth daily. 01/17/18   Carlisle Cater, PA-C  potassium chloride SA (K-DUR,KLOR-CON) 20 MEQ tablet Take 1 tablet (20 mEq total) by mouth daily. 12/14/17   [name], DO  risperiDONE (RISPERDAL) 2 MG tablet Take 1 tablet (2 mg total) by mouth at bedtime. For mood control 04/25/17   Lindell Spar I, NP  SUMAtriptan (IMITREX) 50 MG tablet Take 1 tablet (50 mg total) by mouth every 2 (two) hours as needed for migraine (maximum 2 tabs in 24 hours). May repeat in 2 hours if headache persists or recurs. 08/28/18   [name], DO  topiramate (TOPAMAX) 25 MG tablet 1 po qd x 1 week then 1 po bid x 1 week then 1 po qam and 2 po qpm 08/28/18   Carollee Herter, Alferd Apa, DO  traMADol (ULTRAM) 50 MG  tablet 1-2 po qd 6h prn 08/30/18   Carollee Herter, Alferd Apa, DO  amitriptyline (ELAVIL) 25 MG tablet 2 tablets by mouth at bedtime   06/21/11  [provider]  clidinium-chlordiazePOXIDE (LIBRAX) 2.5-5 MG per capsule 2 capsules by mouth every morning and 1 at bedtime   06/21/11  [provider]    Family History Family History  Problem Relation Age of Onset  . Heart disease Father   . Asthma Maternal Grandmother   . Emphysema Maternal Grandfather   . Cancer Maternal Grandfather        Lung Cancer  . Cancer Other        Lung Cancer-Aunt  . Colon cancer Neg Hx   . Esophageal cancer Neg Hx   . Rectal cancer Neg Hx   . Stomach cancer Neg Hx   . Thyroid disease Neg Hx     Social History Social History   Tobacco Use  . Smoking status: Current Every Day Smoker    Packs/day: 1.00    Years: 14.00    Pack years: 14.00    Types: Cigarettes  . Smokeless tobacco: Never Used  . Tobacco comment: trying to quit  Substance Use Topics  . Alcohol use: Not Currently    Alcohol/week: 0.0 standard drinks    Comment: rare wine   . Drug use: Not Currently    Types: Marijuana    Comment: once a month     Allergies   Abilify [aripiprazole]; Metoclopramide hcl; Propoxyphene; Tramadol; Ambien [zolpidem tartrate]; Eszopiclone; Amitriptyline; Metoclopramide; Other; Varenicline; Buprenorphine hcl; Demerol [meperidine]; Emetrol; Morphine and related; and Propoxyphene n-acetaminophen   Review of Systems Review of Systems  Constitutional: Negative for chills and fever.  HENT: Negative for congestion and rhinorrhea.   Eyes: Negative for redness and visual disturbance.  Respiratory: Negative for shortness of breath and wheezing.   Cardiovascular: Negative for chest pain and palpitations.  Gastrointestinal: Negative for nausea and vomiting.  Genitourinary: Negative for dysuria and urgency.  Musculoskeletal: Negative for  arthralgias and myalgias.  Skin: Negative for pallor and wound.   Neurological: Positive for headaches. Negative for dizziness.     Physical Exam Updated Vital Signs BP 112/82   Pulse (!) 115   Temp 98.6 F (37 C) (Oral)   Resp 20   Ht 5\' 4"  (1.626 m)   Wt 54.9 kg   SpO2 95%   BMI 20.77 kg/m   Physical Exam Vitals signs and nursing note reviewed.  Constitutional:      General: She is not in acute distress.    Appearance: She is well-developed. She is not diaphoretic.  HENT:     Head: Normocephalic and atraumatic.  Eyes:     Pupils: Pupils are equal, round, and reactive to light.  Neck:     Musculoskeletal: Normal range of motion and neck supple.  Cardiovascular:     Rate and Rhythm: Normal rate and regular rhythm.     Heart sounds: No murmur. No friction rub. No gallop.   Pulmonary:     Effort: Pulmonary effort is normal.     Breath sounds: No wheezing or rales.  Abdominal:     General: There is no distension.     Palpations: Abdomen is soft.     Tenderness: There is no abdominal tenderness.  Musculoskeletal:        General: No tenderness.  Skin:    General: Skin is warm and dry.  Neurological:     Mental Status: She is alert and oriented to person, place, and time.     Cranial Nerves: Cranial nerves are intact.     Sensory: Sensation is intact.     Motor: Motor function is intact.     Coordination: Coordination is intact.     Gait: Gait is intact.     Comments: Pupils are 8 mm and reactive bilaterally neuro exam is otherwise benign  Psychiatric:        Behavior: Behavior normal.      ED Treatments / Results  Labs (all labs ordered are listed, but only abnormal results are displayed) Labs Reviewed - No data to display  EKG None  Radiology No results found.  Procedures Procedures (including critical care time)  Medications Ordered in ED Medications  prochlorperazine (COMPAZINE) injection 10 mg (10 mg Intravenous Given 08/31/18 1833)  ketorolac (TORADOL) 15 MG/ML injection 15 mg (15 mg Intravenous Given 08/31/18  1833)  dexamethasone (DECADRON) tablet 10 mg (10 mg Oral Given 08/31/18 1833)  diphenhydrAMINE (BENADRYL) injection 25 mg (25 mg Intravenous Given 08/31/18 1834)     Initial Impression / Assessment and Plan / ED Course  I have reviewed the triage vital signs and the nursing notes.  Pertinent labs & imaging results that were available during my care of the patient were reviewed by me and considered in my medical decision making (see chart for details).        43 yo F with a cc of a headache.  4th visit to the ED in the past two weeks.  She has a benign neurologic exam.  Has had a CT and LP done previously that were unremarkable.  She is unable to get an MRI due to a bladder stimulator.  I will treat her headache here.  Reassess.  Patient feeling better on reassessment.  Discharge home.  9:45 PM:  I have discussed the diagnosis/risks/treatment options with the patient and believe the pt to be eligible for discharge home to follow-up with PCP, neuro. We also discussed returning to the  ED immediately if new or worsening sx occur. We discussed the sx which are most concerning (e.g., sudden worsening pain, fever, inability to tolerate by mouth) that necessitate immediate return. Medications administered to the patient during their visit and any new prescriptions provided to the patient are listed below.  Medications given during this visit Medications  prochlorperazine (COMPAZINE) injection 10 mg (10 mg Intravenous Given 08/31/18 1833)  ketorolac (TORADOL) 15 MG/ML injection 15 mg (15 mg Intravenous Given 08/31/18 1833)  dexamethasone (DECADRON) tablet 10 mg (10 mg Oral Given 08/31/18 1833)  diphenhydrAMINE (BENADRYL) injection 25 mg (25 mg Intravenous Given 08/31/18 1834)     The patient appears reasonably screen and/or stabilized for discharge and I doubt any other medical condition or other [facility] requiring further screening, evaluation, or treatment in the ED at this time prior to discharge.     Final Clinical Impressions(s) / ED Diagnoses   Final diagnoses:  Left-sided headache    ED Discharge Orders    None       Deno Etienne, DO 08/31/18 2145

## 2018-08-31 NOTE — ED Triage Notes (Signed)
Pt reports she has been having recurrent "migraines" and has seen her PCP and had a work up and is seeing neurologist next week. Her current headache started yesterday. She was here last week and had a migraine cocktail which helped

## 2018-08-31 NOTE — ED Notes (Signed)
Dr. Floyd at the bedside.  

## 2018-08-31 NOTE — ED Notes (Signed)
Dr. Tyrone Nine updates on pt's VS and HR 135

## 2018-09-03 ENCOUNTER — Other Ambulatory Visit: Payer: Self-pay | Admitting: Family Medicine

## 2018-09-03 ENCOUNTER — Other Ambulatory Visit: Payer: Self-pay

## 2018-09-03 ENCOUNTER — Encounter: Payer: Self-pay | Admitting: Neurology

## 2018-09-03 ENCOUNTER — Telehealth (INDEPENDENT_AMBULATORY_CARE_PROVIDER_SITE_OTHER): Payer: BLUE CROSS/BLUE SHIELD | Admitting: Neurology

## 2018-09-03 VITALS — Ht 64.0 in | Wt 121.0 lb

## 2018-09-03 DIAGNOSIS — G43719 Chronic migraine without aura, intractable, without status migrainosus: Secondary | ICD-10-CM

## 2018-09-03 DIAGNOSIS — R519 Headache, unspecified: Secondary | ICD-10-CM

## 2018-09-03 DIAGNOSIS — G4452 New daily persistent headache (NDPH): Secondary | ICD-10-CM

## 2018-09-03 DIAGNOSIS — R112 Nausea with vomiting, unspecified: Secondary | ICD-10-CM

## 2018-09-03 LAB — ARBOVIRUS IGG, CSF
California Enceph IgG: <1:1 {titer}
Eastern eq encephalitis, IgG: <1:1 {titer}
St Louis encephalitis, IgG: <1:1 {titer}
West Nile IgG CSF: 0.15 IV (ref <1.30)
Western eq encephalitis, IgG: <1:1 {titer}

## 2018-09-03 MED ORDER — RIZATRIPTAN BENZOATE 10 MG PO TBDP: ORAL_TABLET | ORAL | 3 refills | Status: DC

## 2018-09-03 NOTE — Progress Notes (Addendum)
Virtual Visit via Video Note The purpose of this virtual visit is to provide medical care while limiting exposure to the novel coronavirus.    Consent was obtained for video visit:  Yes.   Answered questions that patient had about telehealth interaction:  Yes.   I discussed the limitations, risks, security and privacy concerns of performing an evaluation and management service by telemedicine. I also discussed with the patient that there may be a patient responsible charge related to this service. The patient expressed understanding and agreed to proceed.  Pt location: Home Physician Location: office Name of referring provider:  [name], * I connected with [name] at patients initiation/request on 09/03/2018 at  2:00 PM EDT by video enabled telemedicine application and verified that I am speaking with the correct person using two identifiers. Pt MRN:  [id number] Pt DOB:  1975-04-15 Video Participants:  [name]   History of Present Illness:  [name] is a 43 year old woman with bipolar disorder, generalized anxiety disorder, fibromyalgia and depression who presents for migraines.  History supplemented by ED and referring provider notes.  Onset:  2 1/2 weeks ago.  No preceding trigger such as head injury.  She has had several ED visits over the past month for headache cocktails but they keep recurring.  CT of the head without contrast from 08/23/2018 was personally reviewed and was unremarkable.  She currently cannot have an MRI because of a stimulator for her bladder.  She has a persistent dull headache with intermittent severe headaches.  No prior history of headaches Location:  Bifrontal/bi-retro-orbital/occipital, back of neck and Quality:  Pressure behind eyes, throbbing in back of head Intensity:  10/10 when severe.  Nauseous and throw up Aura:  no Premonitory Phase:  no Postdrome:  no Associated symptoms:  Nausea, vomiting, photophobia, phonophobia and  neurologist to have any visual symptoms when he has the headaches, blurred vision.  She denies associated unilateral numbness or weakness. Duration:  Until she gets a headache cocktail Frequency:  4 severe migraines over past 2 1/2 weeks requiring attention at ED. Frequency of abortive medication: takes Excedrin or Aleve daily Triggers:  unknown Relieving factors:  Headache cocktail Activity:  aggravates  Current NSAIDS:  Aleve Current analgesics:  Excedrin, Tylenol 4 Current triptans: none Current ergotamine:  none Current anti-emetic: Zofran 4 mg - 8 mg Current muscle relaxants:  none Current anti-anxiolytic:  hydroxyzine Current sleep aide:  none Current Antihypertensive medications:  none Current Antidepressant medications: Prozac 30 mg, mirtazapine 30 mg Current Anticonvulsant medications to: Started on topiramate last week, on 50mg  twice daily for 3 days.  Gabapentin 300 mg twice daily, lamotrigine 100 mg twice daily Current anti-CGRP:  none Current Vitamins/Herbal/Supplements:  none Current Antihistamines/Decongestants:  none Other therapy:  none Hormone/birth control:  none Other medications: Risperidone 2 mg, Seroquel 100mg   Past NSAIDS:  ibuprofen Past analgesics:  Tramadol, Excedrin Migraine, BC Past abortive triptans:  sumatriptan Past abortive ergotamine:  none Past muscle relaxants:  none Past anti-emetic:  promethaizne Past antihypertensive medications:  none Past antidepressant medications:  Amitriptyline 50mg , sertraline 100mg  Past anticonvulsant medications:  none Past anti-CGRP:  none Past vitamins/Herbal/Supplements:  none Past antihistamines/decongestants:  none Other past therapies:  none  Caffeine:  No coffee.  Sometimes soda Diet:  water Depression:  yes; Anxiety:  yes Other pain:  Generalized pain Sleep hygiene:  poor Family history of headache:  Sister (migraines)  Past Medical History: Past Medical History:  Diagnosis Date  .  ADENOMATOUS  COLONIC POLYP 08/31/2007  . Anal fissure 03/11/2009  . Anemia   . Anxiety   . Anxiety and depression   . ARTHRITIS 08/31/2007  . Arthritis   . Asthma   . 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 Bipolar 1 disorder ([place])   . Bowel obstruction ([place])   . BRONCHITIS, RECURRENT 08/23/2009   Asthmatic Bronchitis-Dr. Melvyn Novas.....-HFA 75% 12/04/2008>75% 02/05/2009>75% 08/04/2009 -PFT's 01/04/2009 2.56 (86%) ratio 75, no resp to B2 and DLC0 67% > 80 after correction   . Cancer (HCC)    cervical cancer  . Chronic interstitial cystitis 03/11/2009  . Chronic nausea   . Chronic pain   . Colon polyps   . COLONIC POLYPS, HX OF 07/25/2006   ADENOMATOUS POLYP  . COPD (chronic obstructive pulmonary disease) ([place])   . DEPRESSION 08/31/2007  . Endometriosis   . FIBROMYALGIA 08/31/2007  . Fibromyalgia   . GERD 02/05/2009  . Hyperlipidemia   . HYPERTENSION 08/31/2007  . IBS 03/11/2009  . Internal hemorrhoids   . Migraine headache   . 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 PONV (postoperative nausea and vomiting)   . RECTAL BLEEDING 03/11/2009  . Seizures ([place])    been about 1 year since last seisure per pt  . SLEEP APNEA 08/31/2007  . Substance abuse ([place])   . Thyroid disease   . Uterine cyst     Medications: Outpatient Encounter Medications as of 09/03/2018  Medication Sig  . albuterol (PROVENTIL HFA;VENTOLIN HFA) 108 (90 Base) MCG/ACT inhaler Inhale 2 puffs into the lungs every 6 (six) hours as needed for wheezing or shortness of breath.  . budesonide-formoterol (SYMBICORT) 80-4.5 MCG/ACT inhaler Inhale 2 puffs into the lungs 2 (two) times daily.   . ciprofloxacin (CIPRO) 250 MG tablet Take 1 tablet (250 mg total) by mouth 2 (two) times daily. (Patient not taking: Reported on 08/28/2018)  . cyclobenzaprine (FLEXERIL) 10 MG tablet Take 1 tablet (10 mg total) by mouth 3 (three) times daily as needed for muscle spasms.  Marland Kitchen FLUoxetine (PROZAC) 10 MG capsule Take 3 capsules (30 mg total) by mouth at bedtime. For mood  control  . gabapentin (NEURONTIN) 300 MG capsule Take 1 capsule (300 mg total) by mouth 2 (two) times daily. For agitation  . lamoTRIgine (LAMICTAL) 100 MG tablet Take 100 mg by mouth 2 (two) times daily.   . mirtazapine (REMERON SOL-TAB) 30 MG disintegrating tablet Take 1 tablet (30 mg total) by mouth at bedtime. For depression  . ondansetron (ZOFRAN) 4 MG tablet TAKE 1-2 TABLET BY MOUTH EVERY 8 HOURS AS NEEDED FOR NAUSEA AND VOMITING.  Marland Kitchen oxybutynin (DITROPAN) 5 MG tablet Take 5 mg by mouth 3 (three) times daily.   . polyethylene glycol powder (GLYCOLAX/MIRALAX) powder Take 17 g by mouth daily.  . potassium chloride SA (K-DUR,KLOR-CON) 20 MEQ tablet Take 1 tablet (20 mEq total) by mouth daily.  . risperiDONE (RISPERDAL) 2 MG tablet Take 1 tablet (2 mg total) by mouth at bedtime. For mood control  . SUMAtriptan (IMITREX) 50 MG tablet Take 1 tablet (50 mg total) by mouth every 2 (two) hours as needed for migraine (maximum 2 tabs in 24 hours). May repeat in 2 hours if headache persists or recurs.  . topiramate (TOPAMAX) 25 MG tablet 1 po qd x 1 week then 1 po bid x 1 week then 1 po qam and 2 po qpm  . traMADol (ULTRAM) 50 MG tablet 1-2 po qd 6h prn  . [DISCONTINUED] amitriptyline (ELAVIL) 25 MG tablet 2 tablets by  mouth at bedtime   . [DISCONTINUED] clidinium-chlordiazePOXIDE (LIBRAX) 2.5-5 MG per capsule 2 capsules by mouth every morning and 1 at bedtime    No facility-administered encounter medications on file as of 09/03/2018.     Allergies: Allergies  Allergen Reactions  . Abilify [Aripiprazole] Swelling, Other (See Comments) and Palpitations    tremors Throat swelling, tremors  . Metoclopramide Hcl Other (See Comments)    Causes seizures  . Propoxyphene Rash  . Tramadol Swelling, Other (See Comments) and Rash    Throat swelling, tremors  . Ambien [Zolpidem Tartrate] Other (See Comments)    hallucinations  . Eszopiclone Other (See Comments)    Hallucinations, hyper, bad taste in  mouth   . Amitriptyline     Other reaction(s): Angioedema (ALLERGY/intolerance)  . Metoclopramide     Other reaction(s): Other (See Comments) Seizures  . Other Swelling    Acetaminophen #3 Acetaminophen #3  . Varenicline Other (See Comments)    Suicidal thoughts  . Buprenorphine Hcl Itching and Hives  . Demerol [Meperidine] Rash  . Emetrol Itching, Rash and Hives    Other reaction(s): Rash (ALLERGY/intolerance)  . Morphine And Related Hives and Itching  . Propoxyphene N-Acetaminophen Hives, Itching and Rash    Other reaction(s): Rash (ALLERGY/intolerance)    Family History: Family History  Problem Relation Age of Onset  . Heart disease Father   . Asthma Maternal Grandmother   . Emphysema Maternal Grandfather   . Cancer Maternal Grandfather        Lung Cancer  . Cancer Other        Lung Cancer-Aunt  . Colon cancer Neg Hx   . Esophageal cancer Neg Hx   . Rectal cancer Neg Hx   . Stomach cancer Neg Hx   . Thyroid disease Neg Hx     Social History: Social History   Socioeconomic History  . Marital status: Married    Spouse name: Not on file  . Number of children: 2  . Years of education: Not on file  . Highest education level: Not on file  Occupational History    Employer: UNEMPLOYED  Social Needs  . Financial resource strain: Not on file  . Food insecurity:    Worry: Not on file    Inability: Not on file  . Transportation needs:    Medical: Not on file    Non-medical: Not on file  Tobacco Use  . Smoking status: Current Every Day Smoker    Packs/day: 1.00    Years: 14.00    Pack years: 14.00    Types: Cigarettes  . Smokeless tobacco: Never Used  . Tobacco comment: trying to quit  Substance and Sexual Activity  . Alcohol use: Not Currently    Alcohol/week: 0.0 standard drinks    Comment: rare wine   . Drug use: Not Currently    Types: Marijuana    Comment: once a month  . Sexual activity: Not on file  Lifestyle  . Physical activity:    Days per  week: Not on file    Minutes per session: Not on file  . Stress: Not on file  Relationships  . Social connections:    Talks on phone: Not on file    Gets together: Not on file    Attends religious service: Not on file    Active member of club or organization: Not on file    Attends meetings of clubs or organizations: Not on file    Relationship status: Not on file  .  Intimate partner violence:    Fear of current or ex partner: Not on file    Emotionally abused: Not on file    Physically abused: Not on file    Forced sexual activity: Not on file  Other Topics Concern  . Not on file  Social History Narrative   Homemaker   Daily Caffeine Use-Mtn. Dew          Observations/Objective:   Height 5\' 4"  (1.626 m), weight 121 lb (54.9 kg). No acute distress.  Alert and oriented.  Speech fluent and not dysarthric.  Language intact.  Face symmetric.  Assessment and Plan:   Chronic migraine without aura, without status migrainosus, intractable  1.  For preventative management, she will continue topiramate 50 mg twice daily.  I educated her that we need to give the medication some time before determining if it is ineffective.  I advised her to contact me in 4 weeks with an update and we can either increase dose or change the prophylactic medication. 2.  For abortive therapy, she will try rizatriptan 10 mg.  Zofran for nausea. 3.  Limit use of pain relievers to no more than 2 days out of week to prevent risk of rebound or medication-overuse headache. 4.  Keep headache diary 5.  Exercise, hydration, caffeine cessation, sleep hygiene, monitor for and avoid triggers 6.  Consider:  magnesium citrate 400mg  daily, riboflavin 400mg  daily, and coenzyme Q10 100mg  three times daily 7. Always keep in mind that currently taking a hormone or birth control may be a possible trigger or aggravating factor for migraine. 8. Follow up 4 months  Follow Up Instructions:    -I discussed the assessment and  treatment plan with the patient. The patient was provided an opportunity to ask questions and all were answered. The patient agreed with the plan and demonstrated an understanding of the instructions.   The patient was advised to call back or seek an in-person evaluation if the symptoms worsen or if the condition fails to improve as anticipated.  Dudley Major, DO

## 2018-09-04 ENCOUNTER — Telehealth: Payer: Self-pay

## 2018-09-04 NOTE — Telephone Encounter (Signed)
Copied from [place] [id number]. Topic: General - Inquiry >> Sep 04, 2018 10:20 AM Richardo Priest, NT wrote: Reason for CRM: Patient calling in asking for an higher dosage of ondansetron (ZOFRAN) 4 MG tablet or something stronger due to still having nausea and vomiting as well. Patient states she cannot keep anything down. Call back is [phone].

## 2018-09-04 NOTE — Telephone Encounter (Signed)
Please advise. Pt seen in ED again on 05/23

## 2018-09-05 ENCOUNTER — Telehealth: Payer: Self-pay | Admitting: Neurology

## 2018-09-05 NOTE — Telephone Encounter (Signed)
Zofran not helping and she is unable to keep anything down from getting sick. She is nauseous and was wanting to know if there is anything else she can take. I already verified pharm on file is correct. Please call her back at [phone] or send in something else. Thanks!

## 2018-09-05 NOTE — Telephone Encounter (Signed)
Promethazine 25mg  is fine

## 2018-09-05 NOTE — Telephone Encounter (Signed)
Left detailed message on machine.

## 2018-09-05 NOTE — Telephone Encounter (Signed)
She can take 8 mg zofran ----- she has 4 mg -- she can take 2 If it is related to the migraine she should call neuro also

## 2018-09-06 ENCOUNTER — Other Ambulatory Visit: Payer: Self-pay

## 2018-09-06 MED ORDER — PROMETHAZINE HCL 25 MG PO TABS: 25.0000 mg | ORAL_TABLET | Freq: Four times a day (QID) | ORAL | 1 refills | Status: DC | PRN

## 2018-09-06 NOTE — Progress Notes (Signed)
Called pt to inform. No voicemail on home number. Left message on [phone] number for pt to return call. Promethazine sent to [facility]

## 2018-09-06 NOTE — Telephone Encounter (Signed)
Promethazine sent to [name]'s. Unable to reach pt at home number. Message left on [phone] number for pt to return call.

## 2018-09-10 ENCOUNTER — Emergency Department (HOSPITAL_COMMUNITY)
Admission: EM | Admit: 2018-09-10 | Discharge: 2018-09-10 | Disposition: A | Discharge status: Home / Self Care | Payer: BC Managed Care – PPO | Attending: Emergency Medicine | Admitting: Emergency Medicine

## 2018-09-10 ENCOUNTER — Other Ambulatory Visit: Payer: Self-pay

## 2018-09-10 ENCOUNTER — Emergency Department (HOSPITAL_COMMUNITY): Payer: BC Managed Care – PPO

## 2018-09-10 ENCOUNTER — Encounter (HOSPITAL_COMMUNITY): Payer: Self-pay

## 2018-09-10 DIAGNOSIS — R079 Chest pain, unspecified: Secondary | ICD-10-CM | POA: Diagnosis not present

## 2018-09-10 DIAGNOSIS — J45909 Unspecified asthma, uncomplicated: Secondary | ICD-10-CM | POA: Insufficient documentation

## 2018-09-10 DIAGNOSIS — I10 Essential (primary) hypertension: Secondary | ICD-10-CM | POA: Insufficient documentation

## 2018-09-10 DIAGNOSIS — F17210 Nicotine dependence, cigarettes, uncomplicated: Secondary | ICD-10-CM | POA: Insufficient documentation

## 2018-09-10 DIAGNOSIS — Z9049 Acquired absence of other specified parts of digestive tract: Secondary | ICD-10-CM | POA: Insufficient documentation

## 2018-09-10 DIAGNOSIS — R5381 Other malaise: Secondary | ICD-10-CM | POA: Insufficient documentation

## 2018-09-10 DIAGNOSIS — Z9689 Presence of other specified functional implants: Secondary | ICD-10-CM | POA: Diagnosis not present

## 2018-09-10 DIAGNOSIS — J449 Chronic obstructive pulmonary disease, unspecified: Secondary | ICD-10-CM | POA: Diagnosis not present

## 2018-09-10 DIAGNOSIS — Z8541 Personal history of malignant neoplasm of cervix uteri: Secondary | ICD-10-CM | POA: Insufficient documentation

## 2018-09-10 DIAGNOSIS — R112 Nausea with vomiting, unspecified: Secondary | ICD-10-CM | POA: Diagnosis not present

## 2018-09-10 DIAGNOSIS — Z79899 Other long term (current) drug therapy: Secondary | ICD-10-CM | POA: Insufficient documentation

## 2018-09-10 DIAGNOSIS — F419 Anxiety disorder, unspecified: Secondary | ICD-10-CM | POA: Diagnosis not present

## 2018-09-10 DIAGNOSIS — R000 Tachycardia, unspecified: Secondary | ICD-10-CM | POA: Diagnosis not present

## 2018-09-10 LAB — URINALYSIS, ROUTINE W REFLEX MICROSCOPIC
Bilirubin Urine: NEGATIVE
Glucose, UA: NEGATIVE mg/dL
Hgb urine dipstick: NEGATIVE
Ketones, ur: NEGATIVE mg/dL
Leukocytes,Ua: NEGATIVE
Nitrite: NEGATIVE
Protein, ur: NEGATIVE mg/dL
Specific Gravity, Urine: 1.002 — ABNORMAL LOW (ref 1.005–1.030)
pH: 7 (ref 5.0–8.0)

## 2018-09-10 LAB — BASIC METABOLIC PANEL
Anion gap: 13 (ref 5–15)
BUN: 5 mg/dL — ABNORMAL LOW (ref 6–20)
CO2: 17 mmol/L — ABNORMAL LOW (ref 22–32)
Calcium: 9.3 mg/dL (ref 8.9–10.3)
Chloride: 107 mmol/L (ref 98–111)
Creatinine, Ser: 0.72 mg/dL (ref 0.44–1.00)
GFR calc Af Amer: >60 mL/min (ref >60)
GFR calc non Af Amer: >60 mL/min (ref >60)
Glucose, Bld: 118 mg/dL — ABNORMAL HIGH (ref 70–99)
Potassium: 2.9 mmol/L — ABNORMAL LOW (ref 3.5–5.1)
Sodium: 137 mmol/L (ref 135–145)

## 2018-09-10 LAB — CBC
HCT: 41.1 % (ref 36.0–46.0)
Hemoglobin: 13.9 g/dL (ref 12.0–15.0)
MCH: 31.2 pg (ref 26.0–34.0)
MCHC: 33.8 g/dL (ref 30.0–36.0)
MCV: 92.4 fL (ref 80.0–100.0)
Platelets: 370 10*3/uL (ref 150–400)
RBC: 4.45 MIL/uL (ref 3.87–5.11)
RDW: 13.2 % (ref 11.5–15.5)
WBC: 7.9 10*3/uL (ref 4.0–10.5)
nRBC: 0 % (ref 0.0–0.2)

## 2018-09-10 LAB — TROPONIN I: Troponin I: <0.03 ng/mL (ref <0.03)

## 2018-09-10 LAB — RAPID URINE DRUG SCREEN, HOSP PERFORMED
Amphetamines: NOT DETECTED
Barbiturates: NOT DETECTED
Benzodiazepines: NOT DETECTED
Cocaine: NOT DETECTED
Opiates: POSITIVE — AB
Tetrahydrocannabinol: NOT DETECTED

## 2018-09-10 LAB — TSH: TSH: 1.453 u[IU]/mL (ref 0.350–4.500)

## 2018-09-10 MED ORDER — SODIUM CHLORIDE 0.9% FLUSH: 3.0000 mL | Freq: Once | INTRAVENOUS | Status: DC

## 2018-09-10 MED ORDER — ONDANSETRON HCL 4 MG/2ML IJ SOLN
4.0000 mg | Freq: Once | INTRAMUSCULAR | Status: AC
  Administered 2018-09-10: 4 mg via INTRAVENOUS
  Filled 2018-09-10: qty 2

## 2018-09-10 MED ORDER — SODIUM CHLORIDE 0.9 % IV BOLUS
1000.0000 mL | Freq: Once | INTRAVENOUS | Status: AC
  Administered 2018-09-10: 1000 mL via INTRAVENOUS

## 2018-09-10 NOTE — ED Provider Notes (Signed)
[facility] EMERGENCY DEPARTMENT Provider Note   CSN: [id number] Arrival date & time: 09/10/18  7062    History   Chief Complaint Chief Complaint  Patient presents with   Tachycardia    HPI [name] is a 43 y.o. female.     HPI   She presents for evaluation of a sensation of rapid heartbeat, which started suddenly around 7 AM this morning.  It was accompanied with a sensation of nausea.  She did not eat breakfast or take her morning medications.  Additionally, she complains of frequent episodes of nausea with vomiting, and stomach cramping when eating for the last month.  Recently she has had a bout of headaches, but these have improved after starting Topamax, about 3 weeks ago.  During this time she had multiple ED evaluations.  Also, she states she had a "stomach flu," which abated about 7 days ago.  Currently, she is seeing a psychiatrist but not a therapist for bipolar disorder.  She denies fever, chills, cough, shortness of breath, exposure to Covid-19, diarrhea, dysuria or constipation.  There are no other known modifying factors.  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 Melvyn Novas.....-HFA 75% 12/04/2008>75% 02/05/2009>75% 08/04/2009 -PFT's 01/04/2009 2.56 (86%) ratio 75, no resp to B2 and DLC0 67% > 80 after correction    Cancer (HCC)    cervical cancer   Chronic interstitial cystitis 03/11/2009   Chronic nausea    Chronic pain    Colon polyps    COLONIC POLYPS, HX OF 07/25/2006   ADENOMATOUS POLYP   COPD (chronic obstructive pulmonary disease) ([place])    DEPRESSION 08/31/2007   Endometriosis    FIBROMYALGIA 08/31/2007   Fibromyalgia    GERD  02/05/2009   Hyperlipidemia    HYPERTENSION 08/31/2007   IBS 03/11/2009   Internal hemorrhoids    Migraine headache    NEPHROLITHIASIS 08/31/2007   PONV (postoperative nausea and vomiting)    RECTAL BLEEDING 03/11/2009   Seizures ([place])    been about 1 year since last seisure per pt   SLEEP APNEA 08/31/2007   Substance abuse ([place])    Thyroid disease    Uterine cyst     Patient Active Problem List   Diagnosis Date Noted   Intractable vomiting 08/09/2018   Compression fracture of fifth lumbar vertebra ([place]) 02/07/2018   Altered mental status 02/07/2018   Generalized weakness 01/24/2018   MDD (major depressive disorder) 04/23/2017   Substance abuse ([place]) 04/19/2017   Hyperthyroidism 02/17/2015   Anxiety 01/28/2015   Clinical depression 01/28/2015   COPD with acute exacerbation ([place]) 11/26/2013   Vaginitis and vulvovaginitis 11/26/2013   Recurrent pneumonia 02/02/2013   Left arm pain 01/13/2013   Left arm pain 01/13/2013    Class: Acute   Dizziness and giddiness 01/13/2013   Acute bronchitis 12/07/2012   Rib pain 12/07/2012   Insomnia 08/26/2012   Diarrhea 06/19/2012   Heme positive stool 06/19/2012   Bloating 06/19/2012   Nausea alone 06/19/2012   Bladder retention 04/24/2012   Tremor 03/05/2012   Dysuria 03/05/2012   Failure to thrive in adult 06/21/2011   Airway hyperreactivity 03/08/2011  Back pain, chronic 03/08/2011   Chronic obstructive pulmonary disease ([place]) 03/08/2011   Acid reflux 03/08/2011   Cystitis 01/22/2011   Bilateral hand numbness 01/13/2011   WEIGHT LOSS 06/24/2010   RIB PAIN, LEFT SIDED 05/04/2010   ABDOMINAL PAIN, LEFT UPPER QUADRANT 05/04/2010   Unspecified otitis media 10/26/2009   BRONCHITIS, RECURRENT 08/23/2009   URI, ACUTE 08/04/2009   FATIGUE 07/06/2009   IBS 03/11/2009   ANAL FISSURE 03/11/2009   RECTAL BLEEDING 03/11/2009   Chronic interstitial cystitis 03/11/2009   COLONIC  POLYPS, HX OF 03/11/2009   GERD 02/05/2009   SMOKER 12/04/2008   chronic asthma poorly controlled 12/04/2008   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   BENZODIAZEPINE ADDICTION 08/31/2007   Bipolar disorder ([place]) 08/31/2007   HYPERTENSION 08/31/2007   EMPHYSEMA 08/31/2007   NEPHROLITHIASIS 08/31/2007   ARTHRITIS 08/31/2007   FIBROMYALGIA 08/31/2007   SLEEP APNEA 08/31/2007    Past Surgical History:  Procedure Laterality Date   ABDOMINAL HYSTERECTOMY     bladder stretching x6     BLADDER SURGERY     stimulator placed and stretching    CHOLECYSTECTOMY     COLONOSCOPY     COLONOSCOPY WITH PROPOFOL N/A 09/03/2014   Procedure: COLONOSCOPY WITH PROPOFOL;  Surgeon: Milus Banister, MD;  Location: [facility];  Service: Endoscopy;  Laterality: N/A;   interstitial cystitis     PACEMAKER INSERTION     in hip for interstitial cystitis   removal of uterine cyst and scrapped uterus     replaced bladder pacemaker       OB History    Gravida  3   Para  2   Term      Preterm      AB  1   Living  2     SAB  1   TAB  0   Ectopic  0   Multiple  0   Live Births  2            Home Medications    Prior to Admission medications   Medication Sig Start Date End Date Taking? Authorizing Provider  acetaminophen-codeine (TYLENOL #4) 300-60 MG tablet Take by mouth. 07/16/18   [provider]  albuterol (PROVENTIL HFA;VENTOLIN HFA) 108 (90 Base) MCG/ACT inhaler Inhale 2 puffs into the lungs every 6 (six) hours as needed for wheezing or shortness of breath. 04/25/17   Lindell Spar I, NP  budesonide-formoterol (SYMBICORT) 80-4.5 MCG/ACT inhaler Inhale 2 puffs into the lungs 2 (two) times daily.  12/13/17   [provider]  clonazePAM Bobbye [place]) 0.5 MG tablet  08/12/18   [provider]  cyclobenzaprine (FLEXERIL) 10 MG tablet Take 1 tablet (10 mg total) by mouth 3 (three) times daily as needed for muscle spasms. 08/23/18   [name], DO  FLUoxetine (PROZAC) 10 MG capsule Take 3 capsules (30 mg total) by mouth at bedtime. For mood control 04/25/17   Lindell Spar I, NP  FLUoxetine (PROZAC) 40 MG capsule  06/24/18   [provider]  gabapentin (NEURONTIN) 300 MG capsule Take 1 capsule (300 mg total) by mouth 2 (two) times daily. For agitation 01/25/18   Barton Dubois, MD  hydrOXYzine (ATARAX/VISTARIL) 25 MG tablet TAKE 1 TABLET ONCE DAILY AT NIGHT 08/26/18   [provider]  mirtazapine (REMERON SOL-TAB) 30 MG disintegrating tablet Take 1 tablet (30 mg total) by mouth at bedtime. For depression 04/25/17   Lindell Spar I, NP  ondansetron (ZOFRAN) 4 MG tablet TAKE 1-2  TABLETS EVERY 8 HOURS AS NEEDED FOR NAUSEA AND VOMITING. 09/04/18   Carollee Herter, Alferd Apa, DO  oxybutynin (DITROPAN) 5 MG tablet Take 5 mg by mouth 3 (three) times daily.  01/03/18   [provider]  polyethylene glycol powder (GLYCOLAX/MIRALAX) powder Take 17 g by mouth daily. 01/17/18   Carlisle Cater, PA-C  potassium chloride SA (K-DUR,KLOR-CON) 20 MEQ tablet Take 1 tablet (20 mEq total) by mouth daily. 12/14/17   [name], DO  promethazine (PHENERGAN) 25 MG tablet Take 1 tablet (25 mg total) by mouth every 6 (six) hours as needed for nausea or vomiting. 09/06/18   Pieter Partridge, DO  QUEtiapine (SEROQUEL) 50 MG tablet Take 1 at night for 2 nights, then 2 at night 07/10/18   [provider]  risperiDONE (RISPERDAL) 2 MG tablet Take 1 tablet (2 mg total) by mouth at bedtime. For mood control 04/25/17   Lindell Spar I, NP  rizatriptan (MAXALT-MLT) 10 MG disintegrating tablet Take 1 tablet earliest onset of migraine.  May repeat in 2 hours if needed.  Maximum 2 tablets in 24 hours 09/03/18   Pieter Partridge, DO  topiramate (TOPAMAX) 25 MG tablet 1 po qd x 1 week then 1 po bid x 1 week then 1 po qam and 2 po qpm 08/28/18   Carollee Herter, Alferd Apa, DO  amitriptyline (ELAVIL) 25 MG tablet 2 tablets by mouth at bedtime   06/21/11   [provider]  clidinium-chlordiazePOXIDE (LIBRAX) 2.5-5 MG per capsule 2 capsules by mouth every morning and 1 at bedtime   06/21/11  [provider]    Family History Family History  Problem Relation Age of Onset   Heart disease Father    Asthma Maternal Grandmother    Emphysema Maternal Grandfather    Cancer Maternal Grandfather        Lung Cancer   Cancer Other        Lung Cancer-Aunt   Colon cancer Neg Hx    Esophageal cancer Neg Hx    Rectal cancer Neg Hx    Stomach cancer Neg Hx    Thyroid disease Neg Hx     Social History Social History   Tobacco Use   Smoking status: Current Every Day Smoker    Packs/day: 1.00    Years: 14.00    Pack years: 14.00    Types: Cigarettes   Smokeless tobacco: Never Used   Tobacco comment: trying to quit  Substance Use Topics   Alcohol use: Not Currently    Alcohol/week: 0.0 standard drinks    Comment: rare wine    Drug use: Not Currently    Types: Marijuana    Comment: once a month     Allergies   Abilify [aripiprazole]; Metoclopramide hcl; Propoxyphene; Tramadol; Ambien [zolpidem tartrate]; Eszopiclone; Amitriptyline; Metoclopramide; Other; Varenicline; Buprenorphine hcl; Demerol [meperidine]; Emetrol; Morphine and related; and Propoxyphene n-acetaminophen   Review of Systems Review of Systems  All other systems reviewed and are negative.    Physical Exam Updated Vital Signs BP (!) 123/92    Pulse 99    Resp (!) 21    Ht 5\' 4"  (1.626 m)    Wt 54.9 kg    SpO2 99%    BMI 20.77 kg/m   Physical Exam Vitals signs and nursing note reviewed.  Constitutional:      General: She is not in acute distress.    Appearance: She is well-developed. She is not ill-appearing, toxic-appearing or diaphoretic.  HENT:     Head: Normocephalic and atraumatic.     Right Ear: External ear normal.     Left Ear: External ear normal.  Eyes:     Conjunctiva/sclera: Conjunctivae normal.     Pupils: Pupils are  equal, round, and reactive to light.  Neck:     Musculoskeletal: Normal range of motion and neck supple.     Trachea: Phonation normal.  Cardiovascular:     Rate and Rhythm: Regular rhythm. Tachycardia present.     Heart sounds: Normal heart sounds. No murmur. No friction rub.  Pulmonary:     Effort: Pulmonary effort is normal. No respiratory distress.     Breath sounds: Normal breath sounds. No stridor. No wheezing or rhonchi.  Abdominal:     Palpations: Abdomen is soft.     Tenderness: There is no abdominal tenderness.  Musculoskeletal: Normal range of motion.     Right lower leg: No edema.     Left lower leg: No edema.  Skin:    General: Skin is warm and dry.  Neurological:     Mental Status: She is alert and oriented to person, place, and time.     Cranial Nerves: No cranial nerve deficit.     Sensory: No sensory deficit.     Motor: No abnormal muscle tone.     Coordination: Coordination normal.  Psychiatric:        Behavior: Behavior normal.        Thought Content: Thought content normal.        Judgment: Judgment normal.     Comments: Mild anxiousness      ED Treatments / Results  Labs (all labs ordered are listed, but only abnormal results are displayed) Labs Reviewed  BASIC METABOLIC PANEL - Abnormal; Notable for the following components:      Result Value   Potassium 2.9 (*)    CO2 17 (*)    Glucose, Bld 118 (*)    BUN 5 (*)    All other components within normal limits  URINALYSIS, ROUTINE W REFLEX MICROSCOPIC - Abnormal; Notable for the following components:   Color, Urine STRAW (*)    Specific Gravity, Urine 1.002 (*)    All other components within normal limits  RAPID URINE DRUG SCREEN, HOSP PERFORMED - Abnormal; Notable for the following components:   Opiates POSITIVE (*)    All other components within normal limits  CBC  TROPONIN I  TSH    EKG EKG Interpretation  Date/Time:  Tuesday September 10 2018 09:33:10 EDT Ventricular Rate:  100 PR  Interval:    QRS Duration: 71 QT Interval:  366 QTC Calculation: 473 R Axis:   74 Text Interpretation:  Sinus tachycardia Nonspecific repol abnormality, diffuse leads Baseline wander in lead(s) V1 V2 V3 V5 V6 since last tracing no significant change Confirmed by Daleen Bo [phone]) on 09/10/2018 9:45:34 AM   Radiology Dg Chest Port 1 View  Result Date: 09/10/2018 CLINICAL DATA:  Chest pain EXAM: PORTABLE CHEST 1 VIEW COMPARISON:  January 24, 2018 FINDINGS: There is no edema or consolidation. Heart size and pulmonary vascularity are normal. No adenopathy. No pneumothorax. No bone lesions. IMPRESSION: No edema or consolidation.  Cardiac silhouette within normal limits. Electronically Signed   By: Lowella Grip III M.D.   On: 09/10/2018 11:27    Procedures Procedures (including critical care time)  Medications Ordered in ED Medications  sodium chloride flush (NS) 0.9 % injection 3 mL (3 mLs Intravenous Not Given 09/10/18  1610)  sodium chloride 0.9 % bolus 1,000 mL (0 mLs Intravenous Stopped 09/10/18 1117)  ondansetron (ZOFRAN) injection 4 mg (4 mg Intravenous Given 09/10/18 1008)     Initial Impression / Assessment and Plan / ED Course  I have reviewed the triage vital signs and the nursing notes.  Pertinent labs & imaging results that were available during my care of the patient were reviewed by me and considered in my medical decision making (see chart for details).  Clinical Course as of Sep 10 1210  Tue Sep 10, 2018  1138 Normal except opiates  Urine rapid drug screen (hosp performed)(!) [EW]  1138 Normal  Troponin I - ONCE - STAT [EW]  1138 Normal  CBC [EW]  1138 Normal  Urinalysis, Routine w reflex microscopic(!) [EW]  1138 Normal  Basic metabolic panel(!) [EW]  9604 No infiltrate or CHF, images reviewed by me   [EW]    Clinical Course User Index [EW] Daleen Bo, MD        Patient Vitals for the past 24 hrs:  BP Pulse Resp SpO2 Height Weight  09/10/18 1115  (!) 123/92 99 (!) 21 99 % -- --  09/10/18 1030 (!) 121/92 95 18 97 % -- --  09/10/18 1000 (!) 121/93 98 14 95 % -- --  09/10/18 0930 -- -- -- -- 5\' 4"  (1.626 m) 54.9 kg    11:39 AM Reevaluation with update and discussion. After initial assessment and treatment, an updated evaluation reveals she is comfortable now and has no further complaints.  Findings discussed with the patient and all questions were answered. Daleen Bo   Medical Decision Making: Reported tachycardia, reassuring vitals in the emergency department.  Doubt PE, pneumonia, ACS or metabolic instability.  Suspect anxiety related discomfort.  Patient had secondary complaints after initially saw her of nausea, vomiting and difficulty eating for a month.  These complaints were not mentioned on for prior visits in May 2020.  I doubt that she has an acute gastrointestinal disorder.  She is stable for discharge without further treatment.  CRITICAL CARE-no Performed by: Daleen Bo  Nursing Notes Reviewed/ Care Coordinated Applicable Imaging Reviewed Interpretation of Laboratory Data incorporated into ED treatment  The patient appears reasonably screened and/or stabilized for discharge and I doubt any other medical condition or other [facility] requiring further screening, evaluation, or treatment in the ED at this time prior to discharge.  Plan: Home Medications-continue usual medications; Home Treatments-gradually advance diet; return here if the recommended treatment, does not improve the symptoms; Recommended follow up-follow-up with PCP, psychiatry, and GI referral for difficulty eating and vomiting.   Final Clinical Impressions(s) / ED Diagnoses   Final diagnoses:  Sinus tachycardia  Malaise  Non-intractable vomiting with nausea, unspecified vomiting type    ED Discharge Orders    None       Daleen Bo, MD 09/10/18 1217

## 2018-09-10 NOTE — ED Triage Notes (Signed)
Pt presents to ED with complaints of heart racing then felt SOB, light headed and nauseated. Pt reports it happened appox 2 hours ago. Pt denies ever having episode like this before.

## 2018-09-10 NOTE — Discharge Instructions (Addendum)
There were no complications seen, to explain your rapid heartbeat.  We have done multiple tests which are reassuring.  For the problem eating, call the on-call gastroenterologist, Dr. Oneida Alar to be seen for further evaluation and treatment.  Consider seeing your psychiatrist to discuss treatment of possible anxiety contributing to your discomfort.

## 2018-09-13 ENCOUNTER — Telehealth: Payer: Self-pay | Admitting: Family Medicine

## 2018-09-13 NOTE — Telephone Encounter (Unsigned)
Copied from [place] [phone]. Topic: Referral - Request for Referral >> Sep 13, 2018  1:34 PM Mathis Bud wrote: Has patient seen PCP for this complaint? Yes Referral for which specialty: gastroenterologist  Preferred provider/office: [facility] : [address], [place], [place] [address] Reason for referral: patient states she has not be able to hold anything down for 2 months.  Patient call back # [phone]

## 2018-09-16 ENCOUNTER — Other Ambulatory Visit: Payer: Self-pay | Admitting: Family Medicine

## 2018-09-16 ENCOUNTER — Other Ambulatory Visit: Payer: Self-pay

## 2018-09-16 DIAGNOSIS — R112 Nausea with vomiting, unspecified: Secondary | ICD-10-CM

## 2018-09-16 DIAGNOSIS — J209 Acute bronchitis, unspecified: Secondary | ICD-10-CM

## 2018-09-16 DIAGNOSIS — F411 Generalized anxiety disorder: Secondary | ICD-10-CM | POA: Diagnosis not present

## 2018-09-16 DIAGNOSIS — F319 Bipolar disorder, unspecified: Secondary | ICD-10-CM | POA: Diagnosis not present

## 2018-09-16 NOTE — Telephone Encounter (Signed)
Referral sent 

## 2018-09-16 NOTE — Telephone Encounter (Signed)
Ok to put referral in 

## 2018-09-16 NOTE — Telephone Encounter (Signed)
Please advise 

## 2018-09-18 ENCOUNTER — Other Ambulatory Visit: Payer: Self-pay | Admitting: Family Medicine

## 2018-09-18 DIAGNOSIS — G43909 Migraine, unspecified, not intractable, without status migrainosus: Secondary | ICD-10-CM

## 2018-09-19 ENCOUNTER — Other Ambulatory Visit: Payer: Self-pay | Admitting: Family Medicine

## 2018-09-19 DIAGNOSIS — G43809 Other migraine, not intractable, without status migrainosus: Secondary | ICD-10-CM

## 2018-09-19 MED ORDER — TOPIRAMATE 50 MG PO TABS: 50.0000 mg | ORAL_TABLET | Freq: Two times a day (BID) | ORAL | 5 refills | Status: DC

## 2018-09-19 NOTE — Telephone Encounter (Signed)
Patient called to check the status of her refill request.  Patient stated that she only has a few pills left.  Please advise and cb at [phone]

## 2018-09-19 NOTE — Telephone Encounter (Signed)
This is not a [place] Patient, please advise.

## 2018-09-23 ENCOUNTER — Encounter (INDEPENDENT_AMBULATORY_CARE_PROVIDER_SITE_OTHER): Payer: Self-pay | Admitting: Internal Medicine

## 2018-09-23 ENCOUNTER — Other Ambulatory Visit: Payer: Self-pay

## 2018-09-23 ENCOUNTER — Ambulatory Visit (INDEPENDENT_AMBULATORY_CARE_PROVIDER_SITE_OTHER): Payer: BC Managed Care – PPO | Admitting: Internal Medicine

## 2018-09-23 ENCOUNTER — Other Ambulatory Visit: Payer: Self-pay | Admitting: Family Medicine

## 2018-09-23 VITALS — BP 99/51 | HR 118 | Temp 98.1°F | Ht 64.0 in | Wt 119.8 lb

## 2018-09-23 DIAGNOSIS — R1013 Epigastric pain: Secondary | ICD-10-CM

## 2018-09-23 DIAGNOSIS — R197 Diarrhea, unspecified: Secondary | ICD-10-CM | POA: Diagnosis not present

## 2018-09-23 DIAGNOSIS — G43909 Migraine, unspecified, not intractable, without status migrainosus: Secondary | ICD-10-CM

## 2018-09-23 MED ORDER — PANTOPRAZOLE SODIUM 40 MG PO TBEC: 40.0000 mg | DELAYED_RELEASE_TABLET | Freq: Every day | ORAL | 3 refills | Status: DC

## 2018-09-23 NOTE — Progress Notes (Signed)
Subjective:    Patient ID: [name], female    DOB: [birth year], 43 y.o.   MRN: [id number]  HPI Referred by Dr. Roma Schanz for nausea. About 2 months ago, she started having nausea every day. She says everytime she eats, she will vomit. She vomits 30 minutes after eating. She is taking Phenergan and Zofran for the nausea which is not helping. Last weight in Sep 03, 2018 was 121. Today her weight is 119.8.  The nausea is worse when she moves around. She tries to stay away from fried foods. Appetite is not good. Doesn't want to eat because of the vomiting. States she thinks she has IBS. Has had diarrhea x 2 months. Stools are loose and watery.  She had a UTI infection 2 weeks ago and was on 2 antibiotics.  She does keep liquids down.   She has epigastric tenderness.  She has chronic diarrhea.   She had an EGD back in 2014 which was normal by Dr. Sharlett Iles Biopsy: Benign duodenal mucosa. No changes of sprue, active inflammation or granulomas identified.  CBC    Component Value Date/Time   WBC 7.9 09/10/2018 0933   RBC 4.45 09/10/2018 0933   HGB 13.9 09/10/2018 0933   HCT 41.1 09/10/2018 0933   PLT 370 09/10/2018 0933   MCV 92.4 09/10/2018 0933   MCH 31.2 09/10/2018 0933   MCHC 33.8 09/10/2018 0933   RDW 13.2 09/10/2018 0933   LYMPHSABS 4.7 (H) 08/24/2018 1347   MONOABS 0.4 08/24/2018 1347   EOSABS 0.1 08/24/2018 1347   BASOSABS 0.1 08/24/2018 1347   CMP Latest Ref Rng & Units 09/10/2018 08/25/2018 08/24/2018  Glucose 70 - 99 mg/dL 118(H) 87 142(H)  BUN 6 - 20 mg/dL 5(L) <5(L) 7  Creatinine 0.44 - 1.00 mg/dL 0.72 0.85 0.73  Sodium 135 - 145 mmol/L 137 142 140  Potassium 3.5 - 5.1 mmol/L 2.9(L) 3.9 3.6  Chloride 98 - 111 mmol/L 107 110 108  CO2 22 - 32 mmol/L 17(L) 23 21(L)  Calcium 8.9 - 10.3 mg/dL 9.3 9.6 9.1  Total Protein 6.5 - 8.1 g/dL - - 7.1  Total Bilirubin 0.3 - 1.2 mg/dL - - 0.2(L)  Alkaline Phos 38 - 126 U/L - - 102  AST 15 - 41 U/L - - 23  ALT 0 - 44 U/L -  - 19     Review of Systems Past Medical History:  Diagnosis Date  . 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 Anal fissure 03/11/2009  . Anemia   . Anxiety   . Anxiety and depression   . ARTHRITIS 08/31/2007  . Arthritis   . Asthma   . 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 Bipolar 1 disorder ([place])   . Bowel obstruction ([place])   . BRONCHITIS, RECURRENT 08/23/2009   Asthmatic Bronchitis-Dr. Melvyn Novas.....-HFA 75% 12/04/2008>75% 02/05/2009>75% 08/04/2009 -PFT's 01/04/2009 2.56 (86%) ratio 75, no resp to B2 and DLC0 67% > 80 after correction   . Cancer (HCC)    cervical cancer  . Chronic interstitial cystitis 03/11/2009  . Chronic nausea   . Chronic pain   . Colon polyps   . COLONIC POLYPS, HX OF 07/25/2006   ADENOMATOUS POLYP  . COPD (chronic obstructive pulmonary disease) ([place])   . DEPRESSION 08/31/2007  . Endometriosis   . FIBROMYALGIA 08/31/2007  . Fibromyalgia   . GERD 02/05/2009  . Hyperlipidemia   . HYPERTENSION 08/31/2007  . IBS 03/11/2009  . Internal hemorrhoids   . Migraine headache   .  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 PONV (postoperative nausea and vomiting)   . RECTAL BLEEDING 03/11/2009  . Seizures ([place])    been about 1 year since last seisure per pt  . SLEEP APNEA 08/31/2007  . Substance abuse ([place])   . Thyroid disease   . Uterine cyst     Past Surgical History:  Procedure Laterality Date  . ABDOMINAL HYSTERECTOMY    . bladder stretching x6    . BLADDER SURGERY     stimulator placed and stretching   . CHOLECYSTECTOMY    . COLONOSCOPY    . COLONOSCOPY WITH PROPOFOL N/A 09/03/2014   Procedure: COLONOSCOPY WITH PROPOFOL;  Surgeon: Milus Banister, MD;  Location: [facility];  Service: Endoscopy;  Laterality: N/A;  . interstitial cystitis    . PACEMAKER INSERTION     in hip for interstitial cystitis  . removal of uterine cyst and scrapped uterus    . replaced bladder pacemaker      Allergies  Allergen Reactions  . Abilify [Aripiprazole] Swelling, Other (See  Comments) and Palpitations    tremors Throat swelling, tremors  . Metoclopramide Hcl Other (See Comments)    Causes seizures  . Propoxyphene Rash  . Tramadol Swelling, Other (See Comments) and Rash    Throat swelling, tremors  . Ambien [Zolpidem Tartrate] Other (See Comments)    hallucinations  . Eszopiclone Other (See Comments)    Hallucinations, hyper, bad taste in mouth   . Amitriptyline     Other reaction(s): Angioedema (ALLERGY/intolerance)  . Metoclopramide     Other reaction(s): Other (See Comments) Seizures  . Other Swelling    Acetaminophen #3 Acetaminophen #3  . Varenicline Other (See Comments)    Suicidal thoughts  . Buprenorphine Hcl Itching and Hives  . Demerol [Meperidine] Rash  . Emetrol Itching, Rash and Hives    Other reaction(s): Rash (ALLERGY/intolerance)  . Morphine And Related Hives and Itching  . Propoxyphene N-Acetaminophen Hives, Itching and Rash    Other reaction(s): Rash (ALLERGY/intolerance)    Current Outpatient Medications on File Prior to Visit  Medication Sig Dispense Refill  . acetaminophen-codeine (TYLENOL #4) 300-60 MG tablet Take by mouth.    . budesonide-formoterol (SYMBICORT) 80-4.5 MCG/ACT inhaler Inhale 2 puffs into the lungs 2 (two) times daily.     . clonazePAM (KLONOPIN) 0.5 MG tablet Takes 1-2 a day    . FLUoxetine (PROZAC) 10 MG capsule Take 3 capsules (30 mg total) by mouth at bedtime. For mood control 30 capsule 0  . gabapentin (NEURONTIN) 300 MG capsule Take 1 capsule (300 mg total) by mouth 2 (two) times daily. For agitation    . hydrOXYzine (ATARAX/VISTARIL) 25 MG tablet TAKE 1 TABLET ONCE DAILY AT NIGHT    . mirtazapine (REMERON SOL-TAB) 30 MG disintegrating tablet Take 1 tablet (30 mg total) by mouth at bedtime. For depression 30 tablet 0  . ondansetron (ZOFRAN) 4 MG tablet TAKE 1-2 TABLETS EVERY 8 HOURS AS NEEDED FOR NAUSEA AND VOMITING. 60 tablet 0  . polyethylene glycol powder (GLYCOLAX/MIRALAX) powder Take 17 g by mouth  daily. 255 g 0  . potassium chloride SA (K-DUR,KLOR-CON) 20 MEQ tablet Take 1 tablet (20 mEq total) by mouth daily. 30 tablet 1  . PROAIR HFA 108 (90 Base) MCG/ACT inhaler INHALE 2 PUFFS EVERY 6 HOURS AS NEEDED FOR SHORTNESS OF BREATH AND WHEEZING. 8.5 g 1  . promethazine (PHENERGAN) 25 MG tablet Take 1 tablet (25 mg total) by mouth every 6 (six) hours as needed for nausea  or vomiting. 20 tablet 1  . QUEtiapine (SEROQUEL) 50 MG tablet Take 1 at night for 2 nights, then 2 at night    . risperiDONE (RISPERDAL) 2 MG tablet Take 1 tablet (2 mg total) by mouth at bedtime. For mood control 30 tablet 0  . rizatriptan (MAXALT-MLT) 10 MG disintegrating tablet Take 1 tablet earliest onset of migraine.  May repeat in 2 hours if needed.  Maximum 2 tablets in 24 hours 9 tablet 3  . topiramate (TOPAMAX) 50 MG tablet Take 1 tablet (50 mg total) by mouth 2 (two) times daily. 60 tablet 5  . [DISCONTINUED] amitriptyline (ELAVIL) 25 MG tablet 2 tablets by mouth at bedtime     . [DISCONTINUED] clidinium-chlordiazePOXIDE (LIBRAX) 2.5-5 MG per capsule 2 capsules by mouth every morning and 1 at bedtime      No current facility-administered medications on file prior to visit.         Objective:   Physical Exam Blood pressure (!) 99/51, pulse (!) 118, temperature 98.1 F (36.7 C), height 5\' 4"  (1.626 m), weight 119 lb 12.8 oz (54.3 kg). Alert and oriented. Skin warm and dry. Oral mucosa is moist.   . Sclera anicteric, conjunctivae is pink. Thyroid not enlarged. No cervical lymphadenopathy. Lungs clear. Heart regular rate and rhythm.  Abdomen is soft. Bowel sounds are positive. No hepatomegaly. No abdominal masses felt. Epigastric tenderness.  No edema to lower extremities.          Assessment & Plan:  Chronic nausea and vomiting. Needs EGD. Rx for Protonix sent to her pharmacy.  Diarrhea: GI pathogen. Takes Imodium twice a day.

## 2018-09-23 NOTE — Telephone Encounter (Signed)
Copied from [place] [phone]. Topic: Quick Communication - Rx Refill/Question >> Sep 23, 2018  4:30 PM Jeri Cos wrote: Medication: topiramate (TOPAMAX) 50 MG tablet / PROAIR HFA 108 (90 Base) MCG/ACT inhaler  Has the patient contacted their pharmacy? Yes.    (Agent: If yes, when and what did the pharmacy advise?) Pharmacy advised that there was no prescription on file  Preferred Pharmacy (with phone number or street name): [place], [place] [phone] (Phone) [phone] (Fax)    Agent: Please be advised that RX refills may take up to 3 business days. We ask that you follow-up with your pharmacy.

## 2018-09-23 NOTE — Patient Instructions (Signed)
The risks of bleeding, perforation and infection were reviewed with patient.  

## 2018-09-24 NOTE — Telephone Encounter (Signed)
Both Rx's sent on 09/19/2018

## 2018-09-30 DIAGNOSIS — R197 Diarrhea, unspecified: Secondary | ICD-10-CM | POA: Diagnosis not present

## 2018-10-03 ENCOUNTER — Ambulatory Visit (INDEPENDENT_AMBULATORY_CARE_PROVIDER_SITE_OTHER): Payer: BC Managed Care – PPO | Admitting: Family Medicine

## 2018-10-03 ENCOUNTER — Encounter: Payer: Self-pay | Admitting: Family Medicine

## 2018-10-03 ENCOUNTER — Other Ambulatory Visit: Payer: Self-pay

## 2018-10-03 DIAGNOSIS — G4700 Insomnia, unspecified: Secondary | ICD-10-CM

## 2018-10-03 NOTE — Progress Notes (Signed)
Virtual Visit via Video Note  I connected with [name] on 10/03/18 at 10:45 AM EDT by a video enabled telemedicine application and verified that I am speaking with the correct person using two identifiers.  Location: Patient: home Provider: office    I discussed the limitations of evaluation and management by telemedicine and the availability of in person appointments. The patient expressed understanding and agreed to proceed.  History of Present Illness: Pt was home c/o insomnia.  She is on a lot of meds with psych.   She has tried melatonin with no relief.   She falls asleep but does not stay a sleep  Observations/Objective: No vitals obtained Pt in NAD   Assessment and Plan: 1. Insomnia, unspecified type  inc hydrozyzine to 50 mg po qhs and f/u psych  I am uncomfortable changing / adding medication due to all the meds she is on with psych    Follow Up Instructions:    I discussed the assessment and treatment plan with the patient. The patient was provided an opportunity to ask questions and all were answered. The patient agreed with the plan and demonstrated an understanding of the instructions.   The patient was advised to call back or seek an in-person evaluation if the symptoms worsen or if the condition fails to improve as anticipated.  I provided 15 minutes of non-face-to-face time during this encounter.   [name], DO

## 2018-10-03 NOTE — Assessment & Plan Note (Signed)
Inc hydrozyzine to 50 mg po qhs and f/u psych  I am uncomfortable changing / adding medication due to all the meds she is on with psych

## 2018-10-04 LAB — GASTROINTESTINAL PATHOGEN PANEL PCR
C. difficile Tox A/B, PCR: DETECTED — AB
Campylobacter, PCR: UNDETERMINED — AB
Cryptosporidium, PCR: UNDETERMINED — AB
E coli (ETEC) LT/ST PCR: UNDETERMINED — AB
E coli (STEC) stx1/stx2, PCR: UNDETERMINED — AB
E coli 0157, PCR: UNDETERMINED — AB
Giardia lamblia, PCR: UNDETERMINED — AB
Norovirus, PCR: UNDETERMINED — AB
Rotavirus A, PCR: UNDETERMINED — AB
Salmonella, PCR: UNDETERMINED — AB
Shigella, PCR: UNDETERMINED — AB

## 2018-10-07 ENCOUNTER — Other Ambulatory Visit (INDEPENDENT_AMBULATORY_CARE_PROVIDER_SITE_OTHER): Payer: Self-pay | Admitting: Internal Medicine

## 2018-10-07 ENCOUNTER — Encounter (INDEPENDENT_AMBULATORY_CARE_PROVIDER_SITE_OTHER): Payer: Self-pay | Admitting: *Deleted

## 2018-10-07 DIAGNOSIS — R1013 Epigastric pain: Secondary | ICD-10-CM

## 2018-10-07 DIAGNOSIS — A0472 Enterocolitis due to Clostridium difficile, not specified as recurrent: Secondary | ICD-10-CM

## 2018-10-07 DIAGNOSIS — R112 Nausea with vomiting, unspecified: Secondary | ICD-10-CM

## 2018-10-07 MED ORDER — VANCOMYCIN HCL 125 MG PO CAPS: 125.0000 mg | ORAL_CAPSULE | Freq: Four times a day (QID) | ORAL | 0 refills | Status: DC

## 2018-10-07 NOTE — Progress Notes (Signed)
Rx for Vancomycin sent to her pharmacy

## 2018-10-18 NOTE — Patient Instructions (Signed)
[name]  10/18/2018     @PREFPERIOPPHARMACY @   Your procedure is scheduled on  10/25/2018.  Report to [facility] at  945  A.M.  Call this number if you have problems the morning of surgery:  [phone]   Remember:  Do not eat or drink after midnight.  Follow the diet and prep instructions given to you by Dr Olevia Perches office                      Take these medicines the morning of surgery with A SIP OF WATER Clonazepam, fluoxetine, gabapentin, protonix, phenergan(if needed), maxalt(if needed), topamax, Use your inhalers before you come.    Do not wear jewelry, make-up or nail polish.  Do not wear lotions, powders, or perfumes, or deodorant.  Do not shave 48 hours prior to surgery.  Men may shave face and neck.  Do not bring valuables to the hospital.  [facility] is not responsible for any belongings or valuables.  Contacts, dentures or bridgework may not be worn into surgery.  Leave your suitcase in the car.  After surgery it may be brought to your room.  For patients admitted to the hospital, discharge time will be determined by your treatment team.  Patients discharged the day of surgery will not be allowed to drive home.   Name and phone number of your driver:   family Special instructions:  None  Please read over the following fact sheets that you were given. Anesthesia Post-op Instructions and Care and Recovery After Surgery       Upper Endoscopy, Adult, Care After This sheet gives you information about how to care for yourself after your procedure. Your health care provider may also give you more specific instructions. If you have problems or questions, contact your health care provider. What can I expect after the procedure? After the procedure, it is common to have:  A sore throat.  Mild stomach pain or discomfort.  Bloating.  Nausea. Follow these instructions at home:   Follow instructions from your health care provider about what to eat or  drink after your procedure.  Return to your normal activities as told by your health care provider. Ask your health care provider what activities are safe for you.  Take over-the-counter and prescription medicines only as told by your health care provider.  Do not drive for 24 hours if you were given a sedative during your procedure.  Keep all follow-up visits as told by your health care provider. This is important. Contact a health care provider if you have:  A sore throat that lasts longer than one day.  Trouble swallowing. Get help right away if:  You vomit blood or your vomit looks like coffee grounds.  You have: ? A fever. ? Bloody, black, or tarry stools. ? A severe sore throat or you cannot swallow. ? Difficulty breathing. ? Severe pain in your chest or abdomen. Summary  After the procedure, it is common to have a sore throat, mild stomach discomfort, bloating, and nausea.  Do not drive for 24 hours if you were given a sedative during the procedure.  Follow instructions from your health care provider about what to eat or drink after your procedure.  Return to your normal activities as told by your health care provider. This information is not intended to replace advice given to you by your health care provider. Make sure you discuss any questions you have  with your health care provider. Document Released: 09/26/2011 Document Revised: 09/18/2017 Document Reviewed: 08/27/2017 Elsevier Patient Education  2020 [employer] After These instructions provide you with information about caring for yourself after your procedure. Your health care provider may also give you more specific instructions. Your treatment has been planned according to current medical practices, but problems sometimes occur. Call your health care provider if you have any problems or questions after your procedure. What can I expect after the procedure? After your  procedure, you may:  Feel sleepy for several hours.  Feel clumsy and have poor balance for several hours.  Feel forgetful about what happened after the procedure.  Have poor judgment for several hours.  Feel nauseous or vomit.  Have a sore throat if you had a breathing tube during the procedure. Follow these instructions at home: For at least 24 hours after the procedure:      Have a responsible adult stay with you. It is important to have someone help care for you until you are awake and alert.  Rest as needed.  Do not: ? Participate in activities in which you could fall or become injured. ? Drive. ? Use heavy machinery. ? Drink alcohol. ? Take sleeping pills or medicines that cause drowsiness. ? Make important decisions or sign legal documents. ? Take care of children on your own. Eating and drinking  Follow the diet that is recommended by your health care provider.  If you vomit, drink water, juice, or soup when you can drink without vomiting.  Make sure you have little or no nausea before eating solid foods. General instructions  Take over-the-counter and prescription medicines only as told by your health care provider.  If you have sleep apnea, surgery and certain medicines can increase your risk for breathing problems. Follow instructions from your health care provider about wearing your sleep device: ? Anytime you are sleeping, including during daytime naps. ? While taking prescription pain medicines, sleeping medicines, or medicines that make you drowsy.  If you smoke, do not smoke without supervision.  Keep all follow-up visits as told by your health care provider. This is important. Contact a health care provider if:  You keep feeling nauseous or you keep vomiting.  You feel light-headed.  You develop a rash.  You have a fever. Get help right away if:  You have trouble breathing. Summary  For several hours after your procedure, you may feel  sleepy and have poor judgment.  Have a responsible adult stay with you for at least 24 hours or until you are awake and alert. This information is not intended to replace advice given to you by your health care provider. Make sure you discuss any questions you have with your health care provider. Document Released: 07/18/2015 Document Revised: 06/25/2017 Document Reviewed: 07/18/2015 Elsevier Patient Education  2020 [employer].

## 2018-10-22 ENCOUNTER — Other Ambulatory Visit (HOSPITAL_COMMUNITY)
Admission: RE | Admit: 2018-10-22 | Discharge: 2018-10-22 | Disposition: A | Discharge status: Home / Self Care | Payer: BC Managed Care – PPO | Source: Ambulatory Visit | Attending: Internal Medicine | Admitting: Internal Medicine

## 2018-10-22 ENCOUNTER — Encounter (HOSPITAL_COMMUNITY)
Admission: RE | Admit: 2018-10-22 | Discharge: 2018-10-22 | Disposition: A | Discharge status: Home / Self Care | Payer: BC Managed Care – PPO | Source: Ambulatory Visit | Attending: Internal Medicine | Admitting: Internal Medicine

## 2018-10-22 ENCOUNTER — Other Ambulatory Visit: Payer: Self-pay

## 2018-10-22 ENCOUNTER — Other Ambulatory Visit (INDEPENDENT_AMBULATORY_CARE_PROVIDER_SITE_OTHER): Payer: Self-pay | Admitting: Internal Medicine

## 2018-10-22 ENCOUNTER — Encounter (HOSPITAL_COMMUNITY): Payer: Self-pay

## 2018-10-22 DIAGNOSIS — Z1159 Encounter for screening for other viral diseases: Secondary | ICD-10-CM | POA: Diagnosis not present

## 2018-10-22 DIAGNOSIS — Z20822 Contact with and (suspected) exposure to covid-19: Secondary | ICD-10-CM

## 2018-10-22 DIAGNOSIS — Z01812 Encounter for preprocedural laboratory examination: Secondary | ICD-10-CM | POA: Insufficient documentation

## 2018-10-22 DIAGNOSIS — E876 Hypokalemia: Secondary | ICD-10-CM

## 2018-10-22 HISTORY — DX: Breakdown (mechanical) of urinary electronic stimulator device, initial encounter: T83.110A

## 2018-10-22 LAB — BASIC METABOLIC PANEL
Anion gap: 9 (ref 5–15)
BUN: 6 mg/dL (ref 6–20)
CO2: 26 mmol/L (ref 22–32)
Calcium: 7.9 mg/dL — ABNORMAL LOW (ref 8.9–10.3)
Chloride: 101 mmol/L (ref 98–111)
Creatinine, Ser: 0.8 mg/dL (ref 0.44–1.00)
GFR calc Af Amer: >60 mL/min (ref >60)
GFR calc non Af Amer: >60 mL/min (ref >60)
Glucose, Bld: 90 mg/dL (ref 70–99)
Potassium: 2.5 mmol/L — CL (ref 3.5–5.1)
Sodium: 136 mmol/L (ref 135–145)

## 2018-10-22 LAB — MAGNESIUM: Magnesium: 1.5 mg/dL — ABNORMAL LOW (ref 1.7–2.4)

## 2018-10-22 MED ORDER — POTASSIUM CHLORIDE CRYS ER 20 MEQ PO TBCR: 40.0000 meq | EXTENDED_RELEASE_TABLET | Freq: Two times a day (BID) | ORAL | 1 refills | Status: DC

## 2018-10-22 NOTE — Progress Notes (Signed)
Potassium 2.5 - Instructed patient to eat multiple bananas before procedure on 10/25/2018

## 2018-10-23 ENCOUNTER — Telehealth (INDEPENDENT_AMBULATORY_CARE_PROVIDER_SITE_OTHER): Payer: Self-pay | Admitting: *Deleted

## 2018-10-23 LAB — SARS CORONAVIRUS 2 (TAT 6-24 HRS): SARS Coronavirus 2: NEGATIVE

## 2018-10-23 NOTE — Telephone Encounter (Signed)
Talked with the patient and she is taking her potassium.

## 2018-10-25 ENCOUNTER — Ambulatory Visit (HOSPITAL_COMMUNITY): Payer: BC Managed Care – PPO | Admitting: Anesthesiology

## 2018-10-25 ENCOUNTER — Encounter (HOSPITAL_COMMUNITY)
Admission: RE | Disposition: A | Discharge status: Home / Self Care | Payer: Self-pay | Source: Home / Self Care | Attending: Internal Medicine

## 2018-10-25 ENCOUNTER — Ambulatory Visit (HOSPITAL_COMMUNITY)
Admission: RE | Admit: 2018-10-25 | Discharge: 2018-10-25 | Disposition: A | Discharge status: Home / Self Care | Payer: BC Managed Care – PPO | Attending: Internal Medicine | Admitting: Internal Medicine

## 2018-10-25 ENCOUNTER — Other Ambulatory Visit: Payer: Self-pay

## 2018-10-25 DIAGNOSIS — K3189 Other diseases of stomach and duodenum: Secondary | ICD-10-CM

## 2018-10-25 DIAGNOSIS — F329 Major depressive disorder, single episode, unspecified: Secondary | ICD-10-CM | POA: Diagnosis not present

## 2018-10-25 DIAGNOSIS — I10 Essential (primary) hypertension: Secondary | ICD-10-CM | POA: Insufficient documentation

## 2018-10-25 DIAGNOSIS — G43909 Migraine, unspecified, not intractable, without status migrainosus: Secondary | ICD-10-CM | POA: Diagnosis not present

## 2018-10-25 DIAGNOSIS — K319 Disease of stomach and duodenum, unspecified: Secondary | ICD-10-CM | POA: Insufficient documentation

## 2018-10-25 DIAGNOSIS — F17210 Nicotine dependence, cigarettes, uncomplicated: Secondary | ICD-10-CM | POA: Diagnosis not present

## 2018-10-25 DIAGNOSIS — Z7951 Long term (current) use of inhaled steroids: Secondary | ICD-10-CM | POA: Insufficient documentation

## 2018-10-25 DIAGNOSIS — K219 Gastro-esophageal reflux disease without esophagitis: Secondary | ICD-10-CM | POA: Insufficient documentation

## 2018-10-25 DIAGNOSIS — R112 Nausea with vomiting, unspecified: Secondary | ICD-10-CM | POA: Insufficient documentation

## 2018-10-25 DIAGNOSIS — J449 Chronic obstructive pulmonary disease, unspecified: Secondary | ICD-10-CM | POA: Insufficient documentation

## 2018-10-25 DIAGNOSIS — F419 Anxiety disorder, unspecified: Secondary | ICD-10-CM | POA: Insufficient documentation

## 2018-10-25 DIAGNOSIS — Z8541 Personal history of malignant neoplasm of cervix uteri: Secondary | ICD-10-CM | POA: Insufficient documentation

## 2018-10-25 DIAGNOSIS — R1110 Vomiting, unspecified: Secondary | ICD-10-CM | POA: Insufficient documentation

## 2018-10-25 DIAGNOSIS — Z79899 Other long term (current) drug therapy: Secondary | ICD-10-CM | POA: Insufficient documentation

## 2018-10-25 DIAGNOSIS — R1013 Epigastric pain: Secondary | ICD-10-CM | POA: Diagnosis not present

## 2018-10-25 DIAGNOSIS — G4730 Sleep apnea, unspecified: Secondary | ICD-10-CM | POA: Diagnosis not present

## 2018-10-25 DIAGNOSIS — K228 Other specified diseases of esophagus: Secondary | ICD-10-CM

## 2018-10-25 HISTORY — PX: BIOPSY: SHX5522

## 2018-10-25 HISTORY — PX: ESOPHAGOGASTRODUODENOSCOPY (EGD) WITH PROPOFOL: SHX5813

## 2018-10-25 SURGERY — ESOPHAGOGASTRODUODENOSCOPY (EGD) WITH PROPOFOL
Anesthesia: General

## 2018-10-25 MED ORDER — KETAMINE HCL 50 MG/5ML IJ SOSY
PREFILLED_SYRINGE | INTRAMUSCULAR | Status: AC
  Filled 2018-10-25: qty 5

## 2018-10-25 MED ORDER — PROPOFOL 10 MG/ML IV BOLUS
INTRAVENOUS | Status: AC
  Filled 2018-10-25: qty 20

## 2018-10-25 MED ORDER — PROPOFOL 10 MG/ML IV BOLUS
INTRAVENOUS | Status: DC | PRN
  Administered 2018-10-25: 20 mg via INTRAVENOUS

## 2018-10-25 MED ORDER — LACTATED RINGERS IV SOLN
INTRAVENOUS | Status: DC | PRN
  Administered 2018-10-25: 11:00:00 via INTRAVENOUS

## 2018-10-25 MED ORDER — PROPOFOL 500 MG/50ML IV EMUL
INTRAVENOUS | Status: DC | PRN
  Administered 2018-10-25: 150 ug/kg/min via INTRAVENOUS

## 2018-10-25 MED ORDER — LIDOCAINE HCL (CARDIAC) PF 100 MG/5ML IV SOSY
PREFILLED_SYRINGE | INTRAVENOUS | Status: DC | PRN
  Administered 2018-10-25: 30 mg via INTRAVENOUS

## 2018-10-25 MED ORDER — POTASSIUM CHLORIDE 20 MEQ PO PACK: 20.0000 meq | PACK | Freq: Every day | ORAL | 1 refills | Status: DC

## 2018-10-25 MED ORDER — GLYCOPYRROLATE 0.2 MG/ML IJ SOLN
INTRAMUSCULAR | Status: DC | PRN
  Administered 2018-10-25: 0.2 mg via INTRAVENOUS

## 2018-10-25 MED ORDER — KETAMINE HCL 10 MG/ML IJ SOLN
INTRAMUSCULAR | Status: DC | PRN
  Administered 2018-10-25 (×2): 10 mg via INTRAVENOUS

## 2018-10-25 MED ORDER — ONDANSETRON HCL 4 MG/2ML IJ SOLN
INTRAMUSCULAR | Status: AC
  Filled 2018-10-25: qty 2

## 2018-10-25 MED ORDER — ONDANSETRON HCL 4 MG/2ML IJ SOLN
INTRAMUSCULAR | Status: DC | PRN
  Administered 2018-10-25: 4 mg via INTRAVENOUS

## 2018-10-25 NOTE — Discharge Instructions (Signed)
No aspirin or NSAIDs for 24 hours. Resume usual medications as before. KCl liquid 20 mEq by mouth daily.  Can discontinue KCl tablets. Low-fat diet. No driving for 24 hours. Patient will call with biopsy results and further recommendations.]  PATIENT INSTRUCTIONS POST-ANESTHESIA  IMMEDIATELY FOLLOWING SURGERY:  Do not drive or operate machinery for the first twenty four hours after surgery.  Do not make any important decisions for twenty four hours after surgery or while taking narcotic pain medications or sedatives.  If you develop intractable nausea and vomiting or a severe headache please notify your doctor immediately.  FOLLOW-UP:  Please make an appointment with your surgeon as instructed. You do not need to follow up with anesthesia unless specifically instructed to do so.  WOUND CARE INSTRUCTIONS (if applicable):  Keep a dry clean dressing on the anesthesia/puncture wound site if there is drainage.  Once the wound has quit draining you may leave it open to air.  Generally you should leave the bandage intact for twenty four hours unless there is drainage.  If the epidural site drains for more than 36-48 hours please call the anesthesia department.  QUESTIONS?:  Please feel free to call your physician or the hospital operator if you have any questions, and they will be happy to assist you.       Upper Endoscopy, Adult, Care After This sheet gives you information about how to care for yourself after your procedure. Your health care provider may also give you more specific instructions. If you have problems or questions, contact your health care provider. What can I expect after the procedure? After the procedure, it is common to have:  A sore throat.  Mild stomach pain or discomfort.  Bloating.  Nausea. Follow these instructions at home:   Follow instructions from your health care provider about what to eat or drink after your procedure.  Return to your normal activities as  told by your health care provider. Ask your health care provider what activities are safe for you.  Take over-the-counter and prescription medicines only as told by your health care provider.  Do not drive for 24 hours if you were given a sedative during your procedure.  Keep all follow-up visits as told by your health care provider. This is important. Contact a health care provider if you have:  A sore throat that lasts longer than one day.  Trouble swallowing. Get help right away if:  You vomit blood or your vomit looks like coffee grounds.  You have: ? A fever. ? Bloody, black, or tarry stools. ? A severe sore throat or you cannot swallow. ? Difficulty breathing. ? Severe pain in your chest or abdomen. Summary  After the procedure, it is common to have a sore throat, mild stomach discomfort, bloating, and nausea.  Do not drive for 24 hours if you were given a sedative during the procedure.  Follow instructions from your health care provider about what to eat or drink after your procedure.  Return to your normal activities as told by your health care provider. This information is not intended to replace advice given to you by your health care provider. Make sure you discuss any questions you have with your health care provider. Document Released: 09/26/2011 Document Revised: 09/18/2017 Document Reviewed: 08/27/2017 Elsevier Patient Education  2020 [employer].

## 2018-10-25 NOTE — Transfer of Care (Signed)
Immediate Anesthesia Transfer of Care Note  Patient: [name]  Procedure(s) Performed: ESOPHAGOGASTRODUODENOSCOPY (EGD) WITH PROPOFOL (N/A ) BIOPSY  Patient Location: PACU  Anesthesia Type:General  Level of Consciousness: awake, alert , oriented and patient cooperative  Airway & Oxygen Therapy: Patient Spontanous Breathing  Post-op Assessment: Report given to RN and Post -op Vital signs reviewed and stable  Post vital signs: Reviewed and stable  Last Vitals:  Vitals Value Taken Time  BP 113/90 10/25/18 1135  Temp    Pulse 99 10/25/18 1136  Resp 24 10/25/18 1136  SpO2 98 % 10/25/18 1136  Vitals shown include unvalidated device data.  Last Pain:  Vitals:   10/25/18 1045  TempSrc: Oral      Patients Stated Pain Goal: 5 (46/00/29 8473)  Complications: No apparent anesthesia complications

## 2018-10-25 NOTE — Anesthesia Postprocedure Evaluation (Signed)
Anesthesia Post Note  Patient: [name]  Procedure(s) Performed: ESOPHAGOGASTRODUODENOSCOPY (EGD) WITH PROPOFOL (N/A ) BIOPSY  Patient location during evaluation: PACU Anesthesia Type: General Level of consciousness: awake and alert and oriented Pain management: pain level controlled Vital Signs Assessment: post-procedure vital signs reviewed and stable Respiratory status: spontaneous breathing Cardiovascular status: stable : Some nausea treated with Zofran. Anesthetic complications: no     Last Vitals:  Vitals:   10/25/18 1045  BP: 115/82  Pulse: 91  Resp: 16  Temp: 36.8 C  SpO2: 99%    Last Pain:  Vitals:   10/25/18 1045  TempSrc: Oral                 Kaheem Halleck A

## 2018-10-25 NOTE — Anesthesia Procedure Notes (Signed)
Procedure Name: General with mask airway Performed by: Andree Elk Dietrich Ke A, CRNA Pre-anesthesia Checklist: Patient identified, Emergency Drugs available, Suction available, Timeout performed and Patient being monitored Patient Re-evaluated:Patient Re-evaluated prior to induction Oxygen Delivery Method: Non-rebreather mask

## 2018-10-25 NOTE — H&P (Signed)
[name] is an 43 y.o. female.   Chief Complaint: Patient is here for EGD HPI: Patient is 43 year old Caucasian female who presents with few year history of nausea with sporadic vomiting.  She says her symptoms of gotten worse over the last 3 months.  She has been vomiting every day.  She states she vomits within 30 minutes of her meals.  She also complains of abdominal as well as epigastric pain.  She feels some of her pain is due to cessation cystitis.  She denies frequent heartburn.  She has noted small amount of blood in her vomitus on 2 occasions.  At times she does vomit food that she had eaten several hours earlier.  She denies melena or rectal bleeding.  She says she is maintaining her weight.  She is trying to quit cigarette smoking.  Pack lasted 3 days.  She drinks alcohol occasionally.  She does not take OTC NSAIDs.  She has not noted any symptomatic relief with PPIs.  She is status post cholecystectomy 20 years ago.  Past Medical History:  Diagnosis Date  . 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 Anal fissure 03/11/2009  . Anemia   . Anxiety   . Anxiety and depression   . ARTHRITIS 08/31/2007  . Arthritis   . Asthma   . 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 Bipolar 1 disorder ([place])   . Bowel obstruction ([place])   . Breakdown of urinary electronic stimulator device, init ([place])   . BRONCHITIS, RECURRENT 08/23/2009   Asthmatic Bronchitis-Dr. Melvyn Novas.....-HFA 75% 12/04/2008>75% 02/05/2009>75% 08/04/2009 -PFT's 01/04/2009 2.56 (86%) ratio 75, no resp to B2 and DLC0 67% > 80 after correction   . Cancer (HCC)    cervical cancer  . Chronic interstitial cystitis 03/11/2009  . Chronic nausea   . Chronic pain   . Colon polyps   . COLONIC POLYPS, HX OF 07/25/2006   ADENOMATOUS POLYP  . COPD (chronic obstructive pulmonary disease) ([place])   . DEPRESSION 08/31/2007  . Endometriosis   . FIBROMYALGIA 08/31/2007  . Fibromyalgia   . GERD 02/05/2009  . Hyperlipidemia   . HYPERTENSION 08/31/2007  .  IBS 03/11/2009  . Internal hemorrhoids   . Migraine headache   . 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 PONV (postoperative nausea and vomiting)   . RECTAL BLEEDING 03/11/2009  . Seizures ([place])    been about 1 year since last seisure per pt  . SLEEP APNEA 08/31/2007  . Substance abuse ([place])   . Thyroid disease   . Uterine cyst     Past Surgical History:  Procedure Laterality Date  . ABDOMINAL HYSTERECTOMY    . bladder stretching x6    . BLADDER SURGERY     stimulator placed and stretching   . CHOLECYSTECTOMY    . COLONOSCOPY    . COLONOSCOPY WITH PROPOFOL N/A 09/03/2014   Procedure: COLONOSCOPY WITH PROPOFOL;  Surgeon: Milus Banister, MD;  Location: [facility];  Service: Endoscopy;  Laterality: N/A;  . interstitial cystitis    . PACEMAKER INSERTION     in hip for interstitial cystitis  . removal of uterine cyst and scrapped uterus    . replaced bladder pacemaker      Family History  Problem Relation Age of Onset  . Heart disease Father   . Asthma Maternal Grandmother   . Emphysema Maternal Grandfather   . Cancer Maternal Grandfather        Lung Cancer  . Cancer Other        Lung Cancer-Aunt  .  Colon cancer Neg Hx   . Esophageal cancer Neg Hx   . Rectal cancer Neg Hx   . Stomach cancer Neg Hx   . Thyroid disease Neg Hx    Social History:  reports that she has been smoking cigarettes. She has a 7.00 pack-year smoking history. She has never used smokeless tobacco. She reports previous alcohol use. She reports previous drug use. Drug: Marijuana.  Allergies:  Allergies  Allergen Reactions  . Abilify [Aripiprazole] Swelling, Palpitations and Other (See Comments)    Throat swelling, tremors   . Amitriptyline Anaphylaxis    Angioedema  . Metoclopramide Hcl Other (See Comments)    Causes seizures  . Propoxyphene Rash  . Tramadol Swelling, Other (See Comments) and Rash    Throat swelling, tremors  . Ambien [Zolpidem Tartrate] Other (See Comments)    hallucinations  .  Eszopiclone Other (See Comments)    Hallucinations, hyperactivity, and bad taste in mouth   . Codeine Other (See Comments)    Tylenol #3 caused itching  . Varenicline Other (See Comments)    Suicidal thoughts  . Buprenorphine Hcl Itching and Hives  . Demerol [Meperidine] Rash  . Emetrol Hives, Itching and Rash  . Morphine And Related Hives and Itching    Medications Prior to Admission  Medication Sig Dispense Refill  . acetaminophen-codeine (TYLENOL #4) 300-60 MG tablet Take 1-2 tablets by mouth every 6 (six) hours as needed for severe pain.     . budesonide-formoterol (SYMBICORT) 80-4.5 MCG/ACT inhaler Inhale 2 puffs into the lungs 2 (two) times daily.     . clonazePAM (KLONOPIN) 0.5 MG tablet Take 0.5 mg by mouth 2 (two) times daily as needed for anxiety. Takes 1-2 a day    . FLUoxetine (PROZAC) 40 MG capsule Take 40 mg by mouth daily.    Marland Kitchen gabapentin (NEURONTIN) 300 MG capsule Take 1 capsule (300 mg total) by mouth 2 (two) times daily. For agitation (Patient taking differently: Take 300 mg by mouth 3 (three) times daily. )    . hydrOXYzine (ATARAX/VISTARIL) 25 MG tablet Take 25 mg by mouth at bedtime.     Marland Kitchen oxybutynin (DITROPAN) 5 MG tablet Take 5 mg by mouth every 8 (eight) hours as needed for bladder spasms.    . pantoprazole (PROTONIX) 40 MG tablet Take 1 tablet (40 mg total) by mouth daily. 90 tablet 3  . PROAIR HFA 108 (90 Base) MCG/ACT inhaler INHALE 2 PUFFS EVERY 6 HOURS AS NEEDED FOR SHORTNESS OF BREATH AND WHEEZING. (Patient taking differently: Inhale 2 puffs into the lungs every 6 (six) hours as needed for wheezing or shortness of breath. ) 8.5 g 1  . promethazine (PHENERGAN) 25 MG tablet Take 1 tablet (25 mg total) by mouth every 6 (six) hours as needed for nausea or vomiting. 20 tablet 1  . QUEtiapine (SEROQUEL) 100 MG tablet Take 200 mg by mouth at bedtime.    . rizatriptan (MAXALT-MLT) 10 MG disintegrating tablet Take 1 tablet earliest onset of migraine.  May repeat in 2  hours if needed.  Maximum 2 tablets in 24 hours (Patient taking differently: Take 10 mg by mouth See admin instructions. Take 1 tablet earliest onset of migraine.  May repeat in 2 hours if needed.  Maximum 2 tablets in 24 hours) 9 tablet 3  . topiramate (TOPAMAX) 50 MG tablet Take 1 tablet (50 mg total) by mouth 2 (two) times daily. 60 tablet 5  . vancomycin (VANCOCIN) 125 MG capsule Take 1 capsule (125 mg  total) by mouth 4 (four) times daily. 40 capsule 0  . potassium chloride SA (K-DUR) 20 MEQ tablet Take 2 tablets (40 mEq total) by mouth 2 (two) times daily. 40 mg daily for 2 days and thereafter 20 mEq twice daily. 30 tablet 1    No results found for this or any previous visit (from the past 48 hour(s)). No results found.  ROS  Blood pressure 115/82, pulse 91, temperature 98.3 F (36.8 C), temperature source Oral, resp. rate 16, SpO2 99 %. Physical Exam  Constitutional:  Well-developed thin Caucasian female in NAD.  HENT:  Mouth/Throat: Oropharynx is clear and moist.  Eyes: Conjunctivae are normal. No scleral icterus.  Neck: No thyromegaly present.  Cardiovascular: Normal rate, regular rhythm and normal heart sounds.  No murmur heard. Respiratory: Effort normal and breath sounds normal.  GI:  Abdomen is symmetrical.  She has tattooing left midabdomen.  Abdomen is soft with mild generalized tenderness which appears to be more pronounced than epigastric region.  No organomegaly or masses.  Musculoskeletal:        General: No edema.  Lymphadenopathy:    She has no cervical adenopathy.  Neurological: She is alert.  Skin: Skin is warm and dry.  She has tattoos over both forearms.     Assessment/Plan Nausea and vomiting. Diagnostic EGD.  Hildred Laser, MD 10/25/2018, 11:12 AM

## 2018-10-25 NOTE — Op Note (Signed)
[facility] Patient Name: [name] Procedure Date: 10/25/2018 11:15 AM MRN: [id number] Date of Birth: [birth year] Attending MD: Hildred Laser , MD CSN: [id number] Age: 43 Admit Type: Outpatient Procedure:                Upper GI endoscopy Indications:              Nausea with vomiting Providers:                Hildred Laser, MD, Otis Peak B. Sharon Seller, RN, Nelma Rothman, Technician Referring MD:             [name], DO Medicines:                Propofol per Anesthesia Complications:            No immediate complications. Estimated Blood Loss:     Estimated blood loss was minimal. Procedure:                Pre-Anesthesia Assessment:                           - Prior to the procedure, a History and Physical                            was performed, and patient medications and                            allergies were reviewed. The patient's tolerance of                            previous anesthesia was also reviewed. The risks                            and benefits of the procedure and the sedation                            options and risks were discussed with the patient.                            All questions were answered, and informed consent                            was obtained. Prior Anticoagulants: The patient has                            taken no previous anticoagulant or antiplatelet                            agents. ASA Grade Assessment: III - A patient with                            severe systemic disease. After reviewing the risks  and benefits, the patient was deemed in                            satisfactory condition to undergo the procedure.                           After obtaining informed consent, the endoscope was                            passed under direct vision. Throughout the                            procedure, the patient's blood pressure, pulse, and   oxygen saturations were monitored continuously. The                            GIF-[id number] ([id number]) was introduced through the                            mouth, and advanced to the second part of duodenum.                            The upper GI endoscopy was accomplished without                            difficulty. The patient tolerated the procedure                            well. Scope In: 11:21:00 AM Scope Out: 11:29:09 AM Total Procedure Duration: 0 hours 8 minutes 9 seconds  Findings:      The examined esophagus was normal.      The Z-line was irregular and was found 39 cm from the incisors.      Bilious fluid was found in the gastric body.      Patchy mildly erythematous mucosa without bleeding was found in the       gastric antrum. This was biopsied with a cold forceps for histology. The       pathology specimen was placed into Bottle Number 1.      The exam of the stomach was otherwise normal.      The duodenal bulb and second portion of the duodenum were normal. Impression:               - Normal esophagus.                           - Z-line irregular, 39 cm from the incisors.                           - Bilious gastric fluid.                           - Erythematous mucosa in the antrum. Biopsied.                           - Normal duodenal bulb and second portion of the  duodenum. Moderate Sedation:      Per Anesthesia Care Recommendation:           - Patient has a contact number available for                            emergencies. The signs and symptoms of potential                            delayed complications were discussed with the                            patient. Return to normal activities tomorrow.                            Written discharge instructions were provided to the                            patient.                           - Low fat diet today.                           - Continue present medications.                            - No aspirin, ibuprofen, naproxen, or other                            non-steroidal anti-inflammatory drugs for 1 day.                           - Await pathology results. Procedure Code(s):        --- Professional ---                           [phone], Esophagogastroduodenoscopy, flexible,                            transoral; with biopsy, single or multiple Diagnosis Code(s):        --- Professional ---                           K22.8, Other specified diseases of esophagus                           K31.89, Other diseases of stomach and duodenum                           R11.2, Nausea with vomiting, unspecified CPT copyright 2019 American Medical Association. All rights reserved. The codes documented in this report are preliminary and upon coder review may  be revised to meet current compliance requirements. Hildred Laser, MD Hildred Laser, MD 10/25/2018 11:40:54 AM This report has been signed electronically. Number of Addenda: 0

## 2018-10-25 NOTE — Anesthesia Preprocedure Evaluation (Signed)
Anesthesia Evaluation  Patient identified by MRN, date of birth, ID band Patient awake    Reviewed: Allergy & Precautions, H&P , NPO status , Patient's Chart, lab work & pertinent test results, reviewed documented beta blocker date and time   History of Anesthesia Complications (+) PONV and history of anesthetic complications  Airway Mallampati: II  TM Distance: >3 FB Neck ROM: full    Dental no notable dental hx. (+) Teeth Intact   Pulmonary asthma , sleep apnea , pneumonia, resolved, COPD, Current Smoker,    Pulmonary exam normal        Cardiovascular hypertension, Normal cardiovascular exam     Neuro/Psych  Headaches, Seizures -,  PSYCHIATRIC DISORDERS Anxiety Depression Bipolar Disorder  Neuromuscular disease    GI/Hepatic GERD  Medicated,  Endo/Other  Hyperthyroidism   Renal/GU CRFRenal disease     Musculoskeletal   Abdominal   Peds  Hematology  (+) Blood dyscrasia, anemia ,   Anesthesia Other Findings   Reproductive/Obstetrics                             Anesthesia Physical Anesthesia Plan  ASA: III  Anesthesia Plan: General   Post-op Pain Management:  Regional for Post-op pain   Induction:   PONV Risk Score and Plan: 3 and TIVA  Airway Management Planned:   Additional Equipment:   Intra-op Plan:   Post-operative Plan:   Informed Consent: I have reviewed the patients History and Physical, chart, labs and discussed the procedure including the risks, benefits and alternatives for the proposed anesthesia with the patient or authorized representative who has indicated his/her understanding and acceptance.       Plan Discussed with: CRNA  Anesthesia Plan Comments:         Anesthesia Quick Evaluation

## 2018-10-28 LAB — POCT I-STAT 4, (NA,K, GLUC, HGB,HCT)
Glucose, Bld: 89 mg/dL (ref 70–99)
HCT: 34 % — ABNORMAL LOW (ref 36.0–46.0)
Hemoglobin: 11.6 g/dL — ABNORMAL LOW (ref 12.0–15.0)
Potassium: 2.8 mmol/L — ABNORMAL LOW (ref 3.5–5.1)
Sodium: 139 mmol/L (ref 135–145)

## 2018-11-01 ENCOUNTER — Encounter (HOSPITAL_COMMUNITY): Payer: Self-pay | Admitting: Internal Medicine

## 2018-11-04 ENCOUNTER — Other Ambulatory Visit (INDEPENDENT_AMBULATORY_CARE_PROVIDER_SITE_OTHER): Payer: Self-pay | Admitting: *Deleted

## 2018-11-04 DIAGNOSIS — R109 Unspecified abdominal pain: Secondary | ICD-10-CM

## 2018-11-04 NOTE — Addendum Note (Signed)
Addended by: Grayland Ormond on: 11/04/2018 12:01 PM   Modules accepted: Orders

## 2018-11-05 ENCOUNTER — Encounter (INDEPENDENT_AMBULATORY_CARE_PROVIDER_SITE_OTHER): Payer: Self-pay | Admitting: *Deleted

## 2018-11-06 ENCOUNTER — Telehealth (INDEPENDENT_AMBULATORY_CARE_PROVIDER_SITE_OTHER): Payer: Self-pay | Admitting: *Deleted

## 2018-11-06 NOTE — Telephone Encounter (Signed)
Patient was called (return). The rx was cleared up after calling [facility]. They will call the patient when ready.  Patient was told that her CT was to be on 11/19/18 and she states that they will be on vacation 08/09-08/15. She ask if this could be done sooner?  Patient says that she is still real sick on her stomach and throwing up. No better. She takes the Phenergan and the Zofran, and she says that 1/2 time she throws this up. Up.She becomes emotional and says that she does not know what is wrong and iist here anything that she take to help with this.  To be addressed with Dr.Rehman and patient will be called. [phone].  Per Dr.Rehman- ask patient for the name of her Psychiatrist is Dr.Love and doctor may reached at [phone]. Patient called and made aware that Dr.Rehman was going to talk to this doctor before ordering any other medication. Also her CT has been moved to 11/15/18 @ 12 pm , she will need to arrive @ 11:45.  She cannot have anything to eat or drink 4 hours prior to the procedure, She will need to go to Radiology to pick up contrat and instruction for the contrast.

## 2018-11-13 ENCOUNTER — Telehealth (INDEPENDENT_AMBULATORY_CARE_PROVIDER_SITE_OTHER): Payer: Self-pay | Admitting: Internal Medicine

## 2018-11-13 NOTE — Telephone Encounter (Signed)
Patient called stated she had a procedure last month and wanted to let Dr Laural Golden know she is still vomiting - her ph# [phone]

## 2018-11-14 NOTE — Telephone Encounter (Signed)
To review with Dr.Rehman.

## 2018-11-15 ENCOUNTER — Ambulatory Visit (HOSPITAL_COMMUNITY): Payer: BC Managed Care – PPO | Attending: Internal Medicine

## 2018-11-19 ENCOUNTER — Ambulatory Visit (HOSPITAL_COMMUNITY): Payer: BC Managed Care – PPO

## 2018-11-27 NOTE — Telephone Encounter (Signed)
Patient no showed then canceled the CT.

## 2018-11-27 NOTE — Telephone Encounter (Signed)
Patient has a CT ordered per Dr.Rehman but has not had it done yet. Awaiting those results.

## 2018-11-28 ENCOUNTER — Other Ambulatory Visit: Payer: Self-pay

## 2018-11-28 ENCOUNTER — Encounter (HOSPITAL_COMMUNITY): Payer: Self-pay | Admitting: Emergency Medicine

## 2018-11-28 ENCOUNTER — Emergency Department (HOSPITAL_COMMUNITY): Payer: BC Managed Care – PPO

## 2018-11-28 ENCOUNTER — Emergency Department (HOSPITAL_COMMUNITY)
Admission: EM | Admit: 2018-11-28 | Discharge: 2018-11-29 | Disposition: A | Discharge status: Other Acute Inpatient Hospital | Payer: BC Managed Care – PPO | Attending: Emergency Medicine | Admitting: Emergency Medicine

## 2018-11-28 DIAGNOSIS — F17210 Nicotine dependence, cigarettes, uncomplicated: Secondary | ICD-10-CM | POA: Insufficient documentation

## 2018-11-28 DIAGNOSIS — S299XXA Unspecified injury of thorax, initial encounter: Secondary | ICD-10-CM | POA: Diagnosis not present

## 2018-11-28 DIAGNOSIS — S3992XA Unspecified injury of lower back, initial encounter: Secondary | ICD-10-CM | POA: Diagnosis not present

## 2018-11-28 DIAGNOSIS — J449 Chronic obstructive pulmonary disease, unspecified: Secondary | ICD-10-CM | POA: Insufficient documentation

## 2018-11-28 DIAGNOSIS — F1020 Alcohol dependence, uncomplicated: Secondary | ICD-10-CM | POA: Insufficient documentation

## 2018-11-28 DIAGNOSIS — R45851 Suicidal ideations: Secondary | ICD-10-CM | POA: Insufficient documentation

## 2018-11-28 DIAGNOSIS — F4310 Post-traumatic stress disorder, unspecified: Secondary | ICD-10-CM | POA: Diagnosis not present

## 2018-11-28 DIAGNOSIS — Z950 Presence of cardiac pacemaker: Secondary | ICD-10-CM | POA: Diagnosis not present

## 2018-11-28 DIAGNOSIS — Z79899 Other long term (current) drug therapy: Secondary | ICD-10-CM | POA: Insufficient documentation

## 2018-11-28 DIAGNOSIS — E876 Hypokalemia: Secondary | ICD-10-CM

## 2018-11-28 DIAGNOSIS — Z20828 Contact with and (suspected) exposure to other viral communicable diseases: Secondary | ICD-10-CM | POA: Diagnosis not present

## 2018-11-28 DIAGNOSIS — Y908 Blood alcohol level of 240 mg/100 ml or more: Secondary | ICD-10-CM | POA: Diagnosis not present

## 2018-11-28 DIAGNOSIS — Z03818 Encounter for observation for suspected exposure to other biological agents ruled out: Secondary | ICD-10-CM | POA: Diagnosis not present

## 2018-11-28 DIAGNOSIS — F1010 Alcohol abuse, uncomplicated: Secondary | ICD-10-CM

## 2018-11-28 DIAGNOSIS — M25531 Pain in right wrist: Secondary | ICD-10-CM | POA: Diagnosis not present

## 2018-11-28 DIAGNOSIS — I10 Essential (primary) hypertension: Secondary | ICD-10-CM | POA: Insufficient documentation

## 2018-11-28 DIAGNOSIS — J45909 Unspecified asthma, uncomplicated: Secondary | ICD-10-CM | POA: Insufficient documentation

## 2018-11-28 DIAGNOSIS — F332 Major depressive disorder, recurrent severe without psychotic features: Secondary | ICD-10-CM | POA: Insufficient documentation

## 2018-11-28 DIAGNOSIS — S6991XA Unspecified injury of right wrist, hand and finger(s), initial encounter: Secondary | ICD-10-CM | POA: Diagnosis not present

## 2018-11-28 DIAGNOSIS — R42 Dizziness and giddiness: Secondary | ICD-10-CM | POA: Diagnosis not present

## 2018-11-28 DIAGNOSIS — S0990XA Unspecified injury of head, initial encounter: Secondary | ICD-10-CM | POA: Diagnosis not present

## 2018-11-28 DIAGNOSIS — R112 Nausea with vomiting, unspecified: Secondary | ICD-10-CM | POA: Diagnosis not present

## 2018-11-28 LAB — CBC WITH DIFFERENTIAL/PLATELET
Abs Immature Granulocytes: 0.05 10*3/uL (ref 0.00–0.07)
Basophils Absolute: 0.1 10*3/uL (ref 0.0–0.1)
Basophils Relative: 1 %
Eosinophils Absolute: 0 10*3/uL (ref 0.0–0.5)
Eosinophils Relative: 0 %
HCT: 38.4 % (ref 36.0–46.0)
Hemoglobin: 12.5 g/dL (ref 12.0–15.0)
Immature Granulocytes: 1 %
Lymphocytes Relative: 25 %
Lymphs Abs: 1.8 10*3/uL (ref 0.7–4.0)
MCH: 33.3 pg (ref 26.0–34.0)
MCHC: 32.6 g/dL (ref 30.0–36.0)
MCV: 102.4 fL — ABNORMAL HIGH (ref 80.0–100.0)
Monocytes Absolute: 0.5 10*3/uL (ref 0.1–1.0)
Monocytes Relative: 7 %
Neutro Abs: 4.6 10*3/uL (ref 1.7–7.7)
Neutrophils Relative %: 66 %
Platelets: 537 10*3/uL — ABNORMAL HIGH (ref 150–400)
RBC: 3.75 MIL/uL — ABNORMAL LOW (ref 3.87–5.11)
RDW: 19 % — ABNORMAL HIGH (ref 11.5–15.5)
WBC: 7 10*3/uL (ref 4.0–10.5)
nRBC: 0 % (ref 0.0–0.2)

## 2018-11-28 LAB — URINALYSIS, ROUTINE W REFLEX MICROSCOPIC
Bilirubin Urine: NEGATIVE
Glucose, UA: NEGATIVE mg/dL
Hgb urine dipstick: NEGATIVE
Ketones, ur: NEGATIVE mg/dL
Nitrite: NEGATIVE
Protein, ur: NEGATIVE mg/dL
Specific Gravity, Urine: 1.009 (ref 1.005–1.030)
pH: 6 (ref 5.0–8.0)

## 2018-11-28 LAB — COMPREHENSIVE METABOLIC PANEL
ALT: 83 U/L — ABNORMAL HIGH (ref 0–44)
AST: 148 U/L — ABNORMAL HIGH (ref 15–41)
Albumin: 3.1 g/dL — ABNORMAL LOW (ref 3.5–5.0)
Alkaline Phosphatase: 352 U/L — ABNORMAL HIGH (ref 38–126)
Anion gap: 16 — ABNORMAL HIGH (ref 5–15)
BUN: <5 mg/dL — ABNORMAL LOW (ref 6–20)
CO2: 24 mmol/L (ref 22–32)
Calcium: 7.9 mg/dL — ABNORMAL LOW (ref 8.9–10.3)
Chloride: 105 mmol/L (ref 98–111)
Creatinine, Ser: 0.76 mg/dL (ref 0.44–1.00)
GFR calc Af Amer: >60 mL/min (ref >60)
GFR calc non Af Amer: >60 mL/min (ref >60)
Glucose, Bld: 124 mg/dL — ABNORMAL HIGH (ref 70–99)
Potassium: 2.5 mmol/L — CL (ref 3.5–5.1)
Sodium: 145 mmol/L (ref 135–145)
Total Bilirubin: 0.3 mg/dL (ref 0.3–1.2)
Total Protein: 6.5 g/dL (ref 6.5–8.1)

## 2018-11-28 LAB — RAPID URINE DRUG SCREEN, HOSP PERFORMED
Amphetamines: NOT DETECTED
Barbiturates: NOT DETECTED
Benzodiazepines: POSITIVE — AB
Cocaine: NOT DETECTED
Opiates: POSITIVE — AB
Tetrahydrocannabinol: NOT DETECTED

## 2018-11-28 LAB — SARS CORONAVIRUS 2 BY RT PCR (HOSPITAL ORDER, PERFORMED IN ~~LOC~~ HOSPITAL LAB): SARS Coronavirus 2: NEGATIVE

## 2018-11-28 LAB — MAGNESIUM: Magnesium: 1.9 mg/dL (ref 1.7–2.4)

## 2018-11-28 LAB — I-STAT BETA HCG BLOOD, ED (MC, WL, AP ONLY): I-stat hCG, quantitative: <5 m[IU]/mL (ref <5)

## 2018-11-28 LAB — ETHANOL: Alcohol, Ethyl (B): 245 mg/dL — ABNORMAL HIGH (ref <10)

## 2018-11-28 MED ORDER — POTASSIUM CHLORIDE CRYS ER 20 MEQ PO TBCR
40.0000 meq | EXTENDED_RELEASE_TABLET | Freq: Once | ORAL | Status: DC
  Filled 2018-11-28: qty 2

## 2018-11-28 MED ORDER — MAGNESIUM SULFATE 2 GM/50ML IV SOLN
2.0000 g | Freq: Once | INTRAVENOUS | Status: AC
  Administered 2018-11-28: 2 g via INTRAVENOUS
  Filled 2018-11-28: qty 50

## 2018-11-28 MED ORDER — RIZATRIPTAN BENZOATE 10 MG PO TBDP: 10.0000 mg | ORAL_TABLET | Freq: Every day | ORAL | Status: DC | PRN

## 2018-11-28 MED ORDER — SODIUM CHLORIDE 0.9 % IV BOLUS
1000.0000 mL | Freq: Once | INTRAVENOUS | Status: AC
  Administered 2018-11-28: 1000 mL via INTRAVENOUS

## 2018-11-28 MED ORDER — ALUM & MAG HYDROXIDE-SIMETH 200-200-20 MG/5ML PO SUSP: 30.0000 mL | Freq: Four times a day (QID) | ORAL | Status: DC | PRN

## 2018-11-28 MED ORDER — PANTOPRAZOLE SODIUM 40 MG PO TBEC
40.0000 mg | DELAYED_RELEASE_TABLET | Freq: Every day | ORAL | Status: DC
  Administered 2018-11-29: 40 mg via ORAL
  Filled 2018-11-28: qty 1

## 2018-11-28 MED ORDER — MOMETASONE FURO-FORMOTEROL FUM 100-5 MCG/ACT IN AERO
2.0000 | INHALATION_SPRAY | Freq: Two times a day (BID) | RESPIRATORY_TRACT | Status: DC
  Filled 2018-11-28: qty 8.8

## 2018-11-28 MED ORDER — ONDANSETRON HCL 4 MG PO TABS
4.0000 mg | ORAL_TABLET | Freq: Three times a day (TID) | ORAL | Status: DC | PRN
  Administered 2018-11-29: 4 mg via ORAL
  Filled 2018-11-28: qty 1

## 2018-11-28 MED ORDER — HYDROXYZINE HCL 25 MG PO TABS: 25.0000 mg | ORAL_TABLET | Freq: Every day | ORAL | Status: DC

## 2018-11-28 MED ORDER — ALBUTEROL SULFATE HFA 108 (90 BASE) MCG/ACT IN AERS: 2.0000 | INHALATION_SPRAY | Freq: Four times a day (QID) | RESPIRATORY_TRACT | Status: DC | PRN

## 2018-11-28 MED ORDER — POTASSIUM CHLORIDE CRYS ER 20 MEQ PO TBCR
40.0000 meq | EXTENDED_RELEASE_TABLET | Freq: Every day | ORAL | Status: DC
  Administered 2018-11-29: 40 meq via ORAL

## 2018-11-28 MED ORDER — LORAZEPAM 2 MG/ML IJ SOLN: 0.0000 mg | Freq: Two times a day (BID) | INTRAMUSCULAR | Status: DC

## 2018-11-28 MED ORDER — SUMATRIPTAN SUCCINATE 50 MG PO TABS
100.0000 mg | ORAL_TABLET | Freq: Every day | ORAL | Status: DC | PRN
  Administered 2018-11-29: 100 mg via ORAL
  Filled 2018-11-28 (×3): qty 2

## 2018-11-28 MED ORDER — THIAMINE HCL 100 MG/ML IJ SOLN: 100.0000 mg | Freq: Every day | INTRAMUSCULAR | Status: DC

## 2018-11-28 MED ORDER — OXYBUTYNIN CHLORIDE 5 MG PO TABS: 5.0000 mg | ORAL_TABLET | Freq: Three times a day (TID) | ORAL | Status: DC | PRN

## 2018-11-28 MED ORDER — NICOTINE 21 MG/24HR TD PT24
21.0000 mg | MEDICATED_PATCH | Freq: Every day | TRANSDERMAL | Status: DC
  Administered 2018-11-29: 21 mg via TRANSDERMAL
  Filled 2018-11-28: qty 1

## 2018-11-28 MED ORDER — QUETIAPINE FUMARATE 100 MG PO TABS: 200.0000 mg | ORAL_TABLET | Freq: Every day | ORAL | Status: DC

## 2018-11-28 MED ORDER — GABAPENTIN 300 MG PO CAPS
300.0000 mg | ORAL_CAPSULE | Freq: Three times a day (TID) | ORAL | Status: DC
  Administered 2018-11-29 (×2): 300 mg via ORAL
  Filled 2018-11-28 (×2): qty 1

## 2018-11-28 MED ORDER — FLUOXETINE HCL 20 MG PO CAPS
40.0000 mg | ORAL_CAPSULE | Freq: Every day | ORAL | Status: DC
  Administered 2018-11-29: 40 mg via ORAL
  Filled 2018-11-28: qty 2

## 2018-11-28 MED ORDER — TOPIRAMATE 25 MG PO TABS
50.0000 mg | ORAL_TABLET | Freq: Two times a day (BID) | ORAL | Status: DC
  Administered 2018-11-29: 50 mg via ORAL
  Filled 2018-11-28: qty 2

## 2018-11-28 MED ORDER — VITAMIN B-1 100 MG PO TABS
100.0000 mg | ORAL_TABLET | Freq: Every day | ORAL | Status: DC
  Administered 2018-11-29: 100 mg via ORAL
  Filled 2018-11-28: qty 1

## 2018-11-28 MED ORDER — LORAZEPAM 2 MG/ML IJ SOLN
0.0000 mg | Freq: Four times a day (QID) | INTRAMUSCULAR | Status: DC
  Administered 2018-11-29: 2 mg via INTRAVENOUS
  Administered 2018-11-29: 1 mg via INTRAVENOUS
  Filled 2018-11-28 (×3): qty 1

## 2018-11-28 MED ORDER — LORAZEPAM 1 MG PO TABS: 0.0000 mg | ORAL_TABLET | Freq: Two times a day (BID) | ORAL | Status: DC

## 2018-11-28 MED ORDER — PROMETHAZINE HCL 25 MG/ML IJ SOLN
25.0000 mg | Freq: Once | INTRAMUSCULAR | Status: AC
  Administered 2018-11-28: 22:00:00 25 mg via INTRAVENOUS
  Filled 2018-11-28: qty 1

## 2018-11-28 MED ORDER — ONDANSETRON HCL 4 MG/2ML IJ SOLN
4.0000 mg | Freq: Once | INTRAMUSCULAR | Status: AC
  Administered 2018-11-28: 4 mg via INTRAVENOUS
  Filled 2018-11-28: qty 2

## 2018-11-28 MED ORDER — PROMETHAZINE HCL 25 MG PO TABS
25.0000 mg | ORAL_TABLET | Freq: Four times a day (QID) | ORAL | Status: DC | PRN
  Administered 2018-11-29: 25 mg via ORAL
  Filled 2018-11-28: qty 1

## 2018-11-28 MED ORDER — LORAZEPAM 1 MG PO TABS: 0.0000 mg | ORAL_TABLET | Freq: Four times a day (QID) | ORAL | Status: DC

## 2018-11-28 NOTE — ED Notes (Addendum)
Patient ambulated to and from the bathroom with stand by assist. Gait steady.

## 2018-11-28 NOTE — ED Notes (Signed)
Patient transported to CT 

## 2018-11-28 NOTE — ED Notes (Addendum)
Pts family continues to call harassing staff questioning pt care. An additional family member called from [place] questioning why pt could not have a visitor. Informed her that the pt is in the hallway and it is unsafe to have visitors in the hall. Also explained to her that if the pt was moved into a closed door room, she could then have 1 visitor.

## 2018-11-28 NOTE — ED Notes (Signed)
Spoke with pt and asked again if she was having any thoughts of killing herself. Pt denies SI.

## 2018-11-28 NOTE — ED Notes (Addendum)
Pts sister in lobby demanding to speak with charge nurse. This nurse asked what her concerns were and pts sister stated that the pt is altered and needs a family member at bedside. Nurse expressed to family that pt is alert and oriented and is having no issues expressing her needs. Pts sister then states "I brought her here because she said she's going to kill herself." Nurse explained to pts sister that the pt is not expressing any thought/desire of killing herself. 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

## 2018-11-28 NOTE — ED Notes (Signed)
CRITICAL VALUE STICKER  CRITICAL VALUE: K 2.5  RECEIVER (on-site recipient of call): Jake T RN  DATE & TIME NOTIFIED: 11/28/18 809p  MESSENGER (representative from lab): Ovid Curd  MD NOTIFIED: Gilford Raid  TIME OF NOTIFICATION: 809p  RESPONSE: see orders

## 2018-11-28 NOTE — ED Triage Notes (Signed)
Pt reports that she was brought her by her mother for being drunk. Reports started drinking ETOH the past month. Denies SI or HI.
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
## 2018-11-29 DIAGNOSIS — F17210 Nicotine dependence, cigarettes, uncomplicated: Secondary | ICD-10-CM | POA: Diagnosis not present

## 2018-11-29 DIAGNOSIS — F4310 Post-traumatic stress disorder, unspecified: Secondary | ICD-10-CM | POA: Diagnosis not present

## 2018-11-29 DIAGNOSIS — R45851 Suicidal ideations: Secondary | ICD-10-CM | POA: Diagnosis not present

## 2018-11-29 DIAGNOSIS — F332 Major depressive disorder, recurrent severe without psychotic features: Secondary | ICD-10-CM | POA: Diagnosis not present

## 2018-11-29 DIAGNOSIS — R42 Dizziness and giddiness: Secondary | ICD-10-CM | POA: Diagnosis not present

## 2018-11-29 DIAGNOSIS — R112 Nausea with vomiting, unspecified: Secondary | ICD-10-CM | POA: Diagnosis not present

## 2018-11-29 DIAGNOSIS — Z79899 Other long term (current) drug therapy: Secondary | ICD-10-CM | POA: Diagnosis not present

## 2018-11-29 DIAGNOSIS — J45909 Unspecified asthma, uncomplicated: Secondary | ICD-10-CM | POA: Diagnosis not present

## 2018-11-29 DIAGNOSIS — E876 Hypokalemia: Secondary | ICD-10-CM | POA: Diagnosis not present

## 2018-11-29 DIAGNOSIS — J449 Chronic obstructive pulmonary disease, unspecified: Secondary | ICD-10-CM | POA: Diagnosis not present

## 2018-11-29 DIAGNOSIS — I10 Essential (primary) hypertension: Secondary | ICD-10-CM | POA: Diagnosis not present

## 2018-11-29 DIAGNOSIS — F1020 Alcohol dependence, uncomplicated: Secondary | ICD-10-CM | POA: Diagnosis not present

## 2018-11-29 DIAGNOSIS — Z950 Presence of cardiac pacemaker: Secondary | ICD-10-CM | POA: Diagnosis not present

## 2018-11-29 DIAGNOSIS — Z20828 Contact with and (suspected) exposure to other viral communicable diseases: Secondary | ICD-10-CM | POA: Diagnosis not present

## 2018-11-29 DIAGNOSIS — Y908 Blood alcohol level of 240 mg/100 ml or more: Secondary | ICD-10-CM | POA: Diagnosis not present

## 2018-11-29 MED ORDER — LORAZEPAM 2 MG/ML IJ SOLN
0.0000 mg | INTRAMUSCULAR | Status: DC
  Administered 2018-11-29 (×2): 2 mg via INTRAVENOUS
  Filled 2018-11-29: qty 1

## 2018-11-29 MED ORDER — LORAZEPAM 1 MG PO TABS: 0.0000 mg | ORAL_TABLET | ORAL | Status: DC

## 2018-11-29 MED ORDER — KETOROLAC TROMETHAMINE 15 MG/ML IJ SOLN
15.0000 mg | Freq: Once | INTRAMUSCULAR | Status: AC
  Administered 2018-11-29: 15 mg via INTRAVENOUS
  Filled 2018-11-29: qty 1

## 2018-11-29 MED ORDER — PROMETHAZINE HCL 25 MG/ML IJ SOLN
25.0000 mg | Freq: Once | INTRAMUSCULAR | Status: AC
  Administered 2018-11-29: 25 mg via INTRAVENOUS
  Filled 2018-11-29: qty 1

## 2018-11-29 MED ORDER — PROCHLORPERAZINE EDISYLATE 10 MG/2ML IJ SOLN
10.0000 mg | Freq: Once | INTRAMUSCULAR | Status: AC
  Administered 2018-11-29: 10 mg via INTRAVENOUS
  Filled 2018-11-29: qty 2

## 2018-11-29 NOTE — Progress Notes (Signed)
Pt meets inpatient criteria per Anette Riedel, NP. Referral information has been sent to the following hospitals for review:  [facility]      Disposition will continue to assist with inpatient placement needs.   Audree Camel, LCSW, [facility] Disposition [facility]/TTS [phone]

## 2018-11-29 NOTE — ED Notes (Addendum)
Attempted to call report to facility. Will try again later. [facility] called as well.

## 2018-11-29 NOTE — Discharge Instructions (Signed)
Patient accepted by [facility] for [facility] health intervention.  Dr.CornWall is the accepting doctor.  EMTALA completed.
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
## 2018-11-29 NOTE — ED Notes (Signed)
Patient sent to [facility] per Agricultural consultant.

## 2018-11-29 NOTE — ED Provider Notes (Signed)
Patient accepted by [facility] for psychiatric behavioral health care.  Accepting physician is [facility] patient will be transported by Hulan Fess, MD 11/29/18 [phone]

## 2018-11-29 NOTE — Progress Notes (Signed)
Pt accepted to [facility], main campus    Dr. Jonelle Sports is the accepting/attending provider.    Call report to [place] @ [facility] ED notified.     Pt is voluntary and can be transported by [employer].     Pt is scheduled to arrive at [facility] at [place], [place], [place] Disposition [facility]/TTS [phone]

## 2018-11-29 NOTE — ED Notes (Signed)
Attempted report to receiving facility.

## 2018-11-29 NOTE — ED Notes (Signed)
Patient is resting, complains of having the shakes

## 2018-11-29 NOTE — ED Provider Notes (Signed)
Patient anxious, requests assistance with headache, states that she has migraines, this 1 has not improved with Imitrex. She will receive Compazine, Toradol. She also complains of hand pain, and though she initially states that she has difficulty moving her right hand, there is no appreciable deformity, and x-ray performed earlier in the day was unremarkable. However, the patient is holding it with particular wrist flexion, and states that it hurts in her arm with motion of any digit, inconsistent with tendon disruption. While moving the right hand for evaluation, the patient begins to have similar movement with her left hand, holding it in wrist flexion, stating that it now hurts as well. Patient's evaluation earlier today included unremarkable head CT, x-ray of the wrist that was not notable for fracture. Patient will receive anti-inflammatories, Compazine, may require reassessment, but remains medically appropriate for ongoing psychiatric management.    Carmin Muskrat, MD 11/29/18 [phone]

## 2018-11-29 NOTE — ED Notes (Signed)
Pt ambulated from the room to the restroom and back to the room done very well

## 2018-11-29 NOTE — ED Notes (Signed)
Per chart review, Pt reported SI to TTS.

## 2018-12-02 DIAGNOSIS — R2232 Localized swelling, mass and lump, left upper limb: Secondary | ICD-10-CM | POA: Diagnosis not present

## 2018-12-02 DIAGNOSIS — L03114 Cellulitis of left upper limb: Secondary | ICD-10-CM | POA: Diagnosis not present

## 2018-12-02 DIAGNOSIS — R300 Dysuria: Secondary | ICD-10-CM | POA: Diagnosis not present

## 2018-12-03 DIAGNOSIS — M7989 Other specified soft tissue disorders: Secondary | ICD-10-CM | POA: Diagnosis not present

## 2018-12-03 DIAGNOSIS — I809 Phlebitis and thrombophlebitis of unspecified site: Secondary | ICD-10-CM | POA: Diagnosis not present

## 2018-12-03 DIAGNOSIS — M79602 Pain in left arm: Secondary | ICD-10-CM | POA: Diagnosis not present

## 2018-12-12 DIAGNOSIS — F3181 Bipolar II disorder: Secondary | ICD-10-CM | POA: Diagnosis not present

## 2018-12-13 DIAGNOSIS — F319 Bipolar disorder, unspecified: Secondary | ICD-10-CM | POA: Diagnosis not present

## 2018-12-13 DIAGNOSIS — F411 Generalized anxiety disorder: Secondary | ICD-10-CM | POA: Diagnosis not present

## 2018-12-18 ENCOUNTER — Other Ambulatory Visit: Payer: Self-pay | Admitting: Neurology

## 2018-12-18 ENCOUNTER — Other Ambulatory Visit: Payer: Self-pay

## 2018-12-18 ENCOUNTER — Other Ambulatory Visit: Payer: Self-pay | Admitting: Family Medicine

## 2018-12-18 DIAGNOSIS — R112 Nausea with vomiting, unspecified: Secondary | ICD-10-CM

## 2018-12-19 ENCOUNTER — Emergency Department (HOSPITAL_COMMUNITY)
Admission: EM | Admit: 2018-12-19 | Discharge: 2018-12-19 | Disposition: A | Discharge status: Home / Self Care | Payer: BC Managed Care – PPO | Attending: Emergency Medicine | Admitting: Emergency Medicine

## 2018-12-19 ENCOUNTER — Ambulatory Visit: Payer: BC Managed Care – PPO | Admitting: Family Medicine

## 2018-12-19 ENCOUNTER — Encounter: Payer: Self-pay | Admitting: Family Medicine

## 2018-12-19 ENCOUNTER — Other Ambulatory Visit: Payer: Self-pay

## 2018-12-19 ENCOUNTER — Telehealth: Payer: Self-pay | Admitting: Neurology

## 2018-12-19 ENCOUNTER — Emergency Department (HOSPITAL_COMMUNITY): Payer: BC Managed Care – PPO

## 2018-12-19 ENCOUNTER — Encounter (HOSPITAL_COMMUNITY): Payer: Self-pay

## 2018-12-19 ENCOUNTER — Other Ambulatory Visit (HOSPITAL_COMMUNITY)
Admission: RE | Admit: 2018-12-19 | Discharge: 2018-12-19 | Disposition: A | Discharge status: Home / Self Care | Payer: BC Managed Care – PPO | Source: Ambulatory Visit | Attending: Family Medicine | Admitting: Family Medicine

## 2018-12-19 VITALS — BP 100/88 | HR 114 | Temp 98.2°F | Resp 18 | Ht 64.0 in | Wt 113.0 lb

## 2018-12-19 DIAGNOSIS — S3991XA Unspecified injury of abdomen, initial encounter: Secondary | ICD-10-CM | POA: Diagnosis not present

## 2018-12-19 DIAGNOSIS — E079 Disorder of thyroid, unspecified: Secondary | ICD-10-CM | POA: Insufficient documentation

## 2018-12-19 DIAGNOSIS — Z79899 Other long term (current) drug therapy: Secondary | ICD-10-CM | POA: Insufficient documentation

## 2018-12-19 DIAGNOSIS — R350 Frequency of micturition: Secondary | ICD-10-CM | POA: Diagnosis not present

## 2018-12-19 DIAGNOSIS — I808 Phlebitis and thrombophlebitis of other sites: Secondary | ICD-10-CM

## 2018-12-19 DIAGNOSIS — M546 Pain in thoracic spine: Secondary | ICD-10-CM | POA: Insufficient documentation

## 2018-12-19 DIAGNOSIS — S27321A Contusion of lung, unilateral, initial encounter: Secondary | ICD-10-CM | POA: Diagnosis not present

## 2018-12-19 DIAGNOSIS — N760 Acute vaginitis: Secondary | ICD-10-CM | POA: Diagnosis not present

## 2018-12-19 DIAGNOSIS — R52 Pain, unspecified: Secondary | ICD-10-CM | POA: Diagnosis not present

## 2018-12-19 DIAGNOSIS — J449 Chronic obstructive pulmonary disease, unspecified: Secondary | ICD-10-CM | POA: Diagnosis not present

## 2018-12-19 DIAGNOSIS — I10 Essential (primary) hypertension: Secondary | ICD-10-CM | POA: Diagnosis not present

## 2018-12-19 DIAGNOSIS — S3993XA Unspecified injury of pelvis, initial encounter: Secondary | ICD-10-CM | POA: Diagnosis not present

## 2018-12-19 DIAGNOSIS — S0990XA Unspecified injury of head, initial encounter: Secondary | ICD-10-CM | POA: Diagnosis not present

## 2018-12-19 DIAGNOSIS — K219 Gastro-esophageal reflux disease without esophagitis: Secondary | ICD-10-CM

## 2018-12-19 DIAGNOSIS — Z7251 High risk heterosexual behavior: Secondary | ICD-10-CM | POA: Diagnosis not present

## 2018-12-19 DIAGNOSIS — Y929 Unspecified place or not applicable: Secondary | ICD-10-CM | POA: Diagnosis not present

## 2018-12-19 DIAGNOSIS — F17210 Nicotine dependence, cigarettes, uncomplicated: Secondary | ICD-10-CM | POA: Insufficient documentation

## 2018-12-19 DIAGNOSIS — Y999 Unspecified external cause status: Secondary | ICD-10-CM | POA: Insufficient documentation

## 2018-12-19 DIAGNOSIS — R000 Tachycardia, unspecified: Secondary | ICD-10-CM | POA: Diagnosis not present

## 2018-12-19 DIAGNOSIS — Y939 Activity, unspecified: Secondary | ICD-10-CM | POA: Diagnosis not present

## 2018-12-19 DIAGNOSIS — F1290 Cannabis use, unspecified, uncomplicated: Secondary | ICD-10-CM | POA: Insufficient documentation

## 2018-12-19 DIAGNOSIS — M542 Cervicalgia: Secondary | ICD-10-CM | POA: Diagnosis not present

## 2018-12-19 DIAGNOSIS — R1084 Generalized abdominal pain: Secondary | ICD-10-CM | POA: Diagnosis not present

## 2018-12-19 DIAGNOSIS — S3992XA Unspecified injury of lower back, initial encounter: Secondary | ICD-10-CM | POA: Diagnosis not present

## 2018-12-19 DIAGNOSIS — M549 Dorsalgia, unspecified: Secondary | ICD-10-CM

## 2018-12-19 DIAGNOSIS — S199XXA Unspecified injury of neck, initial encounter: Secondary | ICD-10-CM | POA: Diagnosis not present

## 2018-12-19 DIAGNOSIS — S299XXA Unspecified injury of thorax, initial encounter: Secondary | ICD-10-CM | POA: Diagnosis not present

## 2018-12-19 LAB — CBC
HCT: 33.9 % — ABNORMAL LOW (ref 36.0–46.0)
Hemoglobin: 11 g/dL — ABNORMAL LOW (ref 12.0–15.0)
MCH: 34 pg (ref 26.0–34.0)
MCHC: 32.4 g/dL (ref 30.0–36.0)
MCV: 104.6 fL — ABNORMAL HIGH (ref 80.0–100.0)
Platelets: 411 10*3/uL — ABNORMAL HIGH (ref 150–400)
RBC: 3.24 MIL/uL — ABNORMAL LOW (ref 3.87–5.11)
RDW: 14.2 % (ref 11.5–15.5)
WBC: 10.7 10*3/uL — ABNORMAL HIGH (ref 4.0–10.5)
nRBC: 0 % (ref 0.0–0.2)

## 2018-12-19 LAB — URINALYSIS, ROUTINE W REFLEX MICROSCOPIC
Bilirubin Urine: NEGATIVE
Glucose, UA: NEGATIVE mg/dL
Hgb urine dipstick: NEGATIVE
Ketones, ur: NEGATIVE mg/dL
Leukocytes,Ua: NEGATIVE
Nitrite: NEGATIVE
Protein, ur: NEGATIVE mg/dL
Specific Gravity, Urine: 1.011 (ref 1.005–1.030)
pH: 7 (ref 5.0–8.0)

## 2018-12-19 LAB — POCT URINALYSIS DIP (MANUAL ENTRY)
Bilirubin, UA: NEGATIVE
Blood, UA: NEGATIVE
Glucose, UA: NEGATIVE mg/dL
Ketones, POC UA: NEGATIVE mg/dL
Leukocytes, UA: NEGATIVE
Nitrite, UA: NEGATIVE
Protein Ur, POC: NEGATIVE mg/dL
Spec Grav, UA: 1.015 (ref 1.010–1.025)
Urobilinogen, UA: 0.2 E.U./dL
pH, UA: 6.5 (ref 5.0–8.0)

## 2018-12-19 LAB — COMPREHENSIVE METABOLIC PANEL
ALT: 17 U/L (ref 0–44)
AST: 21 U/L (ref 15–41)
Albumin: 2.9 g/dL — ABNORMAL LOW (ref 3.5–5.0)
Alkaline Phosphatase: 126 U/L (ref 38–126)
Anion gap: 8 (ref 5–15)
BUN: 7 mg/dL (ref 6–20)
CO2: 22 mmol/L (ref 22–32)
Calcium: 8.9 mg/dL (ref 8.9–10.3)
Chloride: 111 mmol/L (ref 98–111)
Creatinine, Ser: 0.9 mg/dL (ref 0.44–1.00)
GFR calc Af Amer: >60 mL/min (ref >60)
GFR calc non Af Amer: >60 mL/min (ref >60)
Glucose, Bld: 120 mg/dL — ABNORMAL HIGH (ref 70–99)
Potassium: 3.2 mmol/L — ABNORMAL LOW (ref 3.5–5.1)
Sodium: 141 mmol/L (ref 135–145)
Total Bilirubin: 0.3 mg/dL (ref 0.3–1.2)
Total Protein: 6.5 g/dL (ref 6.5–8.1)

## 2018-12-19 LAB — I-STAT CHEM 8, ED
BUN: 7 mg/dL (ref 6–20)
Calcium, Ion: 1.24 mmol/L (ref 1.15–1.40)
Chloride: 108 mmol/L (ref 98–111)
Creatinine, Ser: 0.8 mg/dL (ref 0.44–1.00)
Glucose, Bld: 118 mg/dL — ABNORMAL HIGH (ref 70–99)
HCT: 35 % — ABNORMAL LOW (ref 36.0–46.0)
Hemoglobin: 11.9 g/dL — ABNORMAL LOW (ref 12.0–15.0)
Potassium: 3.2 mmol/L — ABNORMAL LOW (ref 3.5–5.1)
Sodium: 142 mmol/L (ref 135–145)
TCO2: 22 mmol/L (ref 22–32)

## 2018-12-19 LAB — I-STAT BETA HCG BLOOD, ED (MC, WL, AP ONLY): I-stat hCG, quantitative: <5 m[IU]/mL (ref <5)

## 2018-12-19 LAB — LACTIC ACID, PLASMA: Lactic Acid, Venous: 1.1 mmol/L (ref 0.5–1.9)

## 2018-12-19 LAB — ETHANOL: Alcohol, Ethyl (B): <10 mg/dL (ref <10)

## 2018-12-19 LAB — CDS SEROLOGY

## 2018-12-19 LAB — PROTIME-INR
INR: 0.9 (ref 0.8–1.2)
Prothrombin Time: 12 seconds (ref 11.4–15.2)

## 2018-12-19 MED ORDER — LORAZEPAM 2 MG/ML IJ SOLN
2.0000 mg | Freq: Once | INTRAMUSCULAR | Status: AC
  Administered 2018-12-19: 2 mg via INTRAVENOUS
  Filled 2018-12-19: qty 1

## 2018-12-19 MED ORDER — HYDROMORPHONE HCL 1 MG/ML IJ SOLN
1.0000 mg | Freq: Once | INTRAMUSCULAR | Status: AC
  Administered 2018-12-19: 1 mg via INTRAVENOUS
  Filled 2018-12-19: qty 1

## 2018-12-19 MED ORDER — PROMETHAZINE HCL 25 MG/ML IJ SOLN
25.0000 mg | Freq: Once | INTRAMUSCULAR | Status: AC
  Administered 2018-12-19: 21:00:00 25 mg via INTRAVENOUS
  Filled 2018-12-19: qty 1

## 2018-12-19 MED ORDER — HYDROMORPHONE HCL 1 MG/ML IJ SOLN
INTRAMUSCULAR | Status: AC | PRN
  Administered 2018-12-19: 1 mg via INTRAVENOUS

## 2018-12-19 MED ORDER — METRONIDAZOLE 500 MG PO TABS: 500.0000 mg | ORAL_TABLET | Freq: Two times a day (BID) | ORAL | 0 refills | Status: DC

## 2018-12-19 MED ORDER — ACETAMINOPHEN-CODEINE #4 300-60 MG PO TABS: 1.0000 | ORAL_TABLET | ORAL | 0 refills | Status: DC | PRN

## 2018-12-19 MED ORDER — IOHEXOL 300 MG/ML  SOLN
100.0000 mL | Freq: Once | INTRAMUSCULAR | Status: AC | PRN
  Administered 2018-12-19: 100 mL via INTRAVENOUS

## 2018-12-19 MED ORDER — OMEPRAZOLE 40 MG PO CPDR: 40.0000 mg | DELAYED_RELEASE_CAPSULE | Freq: Every day | ORAL | 3 refills | Status: DC

## 2018-12-19 MED ORDER — HYDROMORPHONE HCL 1 MG/ML IJ SOLN
INTRAMUSCULAR | Status: AC
  Filled 2018-12-19: qty 1

## 2018-12-19 MED ORDER — HYDROMORPHONE HCL 1 MG/ML IJ SOLN
1.0000 mg | Freq: Once | INTRAMUSCULAR | Status: AC
  Administered 2018-12-19: 1 mg via INTRAVENOUS

## 2018-12-19 NOTE — Patient Instructions (Signed)
Thrombophlebitis Thrombophlebitis is a condition in which a blood clot forms in a vein. This can happen in your arms or legs, or in the area between your neck and groin (torso). When this condition happens in a vein that is close to the surface of the body (superficial thrombophlebitis), it is usually not serious.However, when the condition happens in a vein that is deep inside the body (deep vein thrombosis, DVT), it can cause serious problems. What are the causes? This condition may be caused by:  Damage to a vein.  Inflammation of the veins.  A condition that causes blood to clot more easily.  Reduced blood flow through the veins. What increases the risk? The following factors may make you more likely to develop this condition:  Having a condition that makes blood thicker or more likely to clot.  Having an infection.  Having major surgery.  Experiencing a traumatic injury or a broken bone.  Having a catheter in a vein (central line).  Having a condition in which valves in the veins do not work properly, causing blood to collect (pool) in the veins (chronic venous insufficiency).  An inactive (sedentary) lifestyle.  Pregnancy or having recently given birth.  Cancer.  Older age, especially being 60 or older.  Obesity.  Smoking.  Taking medicines that contain estrogen, such as birth control pills.  Having varicose veins.  Using drugs that are injected into the veins (intravenous, IV). What are the signs or symptoms? The main symptoms of this condition are:  Swelling and pain in an arm or leg. If the affected vein is in the leg, you may feel pain while standing or walking.  Warmth or redness in an arm or leg. Other symptoms include:  Low-grade fever.  Muscle aches.  A bulging vein (venous distension). In some cases, there are no symptoms. How is this diagnosed? This condition may be diagnosed based on:  Your symptoms and medical history.  A physical exam.   Tests, such as: ? Blood tests. ? A test that uses sound waves to make images (ultrasound). How is this treated? Treatment depends on how severe the condition is and which area of the body is affected. Treatment may include:  Applying a warm compress or heating pad to affected areas.  Wearing compression stockings to help prevent blood clots and reduce swelling in your legs.  Raising (elevating) the affected arm or leg above the level of your heart.  Medicines, such as: ? Anti-inflammatory medicines, such as ibuprofen. ? Blood thinners (anticoagulants), such as heparin. ? Antibiotic medicine, if you have an infection.  Removing an IV that may be causing the problem. In rare cases, surgery may be needed to:  Remove a damaged section of a vein.  Place a filter in a large vein to catch blood clots before they reach the lungs. Follow these instructions at home: Medicines  Take over-the-counter and prescription medicines only as told by your health care provider.  If you were prescribed an antibiotic, take it as told by your health care provider. Do not stop using the antibiotic even if you feel better. Managing pain, stiffness, and swelling   If directed, put heat on the affected area as often as told by your health care provider. Use the heat source that your health care provider recommends, such as a moist heat pack or a heating pad. ? Place a towel between your skin and the heat source. ? Leave the heat on for 20-30 minutes. ? Remove the heat   if your skin turns bright red. This is especially important if you are not able to feel pain, heat, or cold. You may have a greater risk of getting burned.  Elevate the affected area above the level of your heart while you are sitting or lying down. Activity  Return to your normal activities as told by your health care provider. Ask your health care provider what activities are safe for you.  Avoid sitting or lying down for long  periods. If possible, stand up and walk around regularly. If you are taking blood thinners:  Take your medicine exactly as told, at the same time every day.  Avoid activities that could cause injury or bruising, and follow instructions about how to prevent falls.  Wear a medical alert bracelet or carry a card that lists what medicines you take. General instructions  Drink enough fluid to keep your urine pale yellow.  Wear compression stockings as told by your health care provider.  Do not use any products that contain nicotine or tobacco, such as cigarettes and e-cigarettes. If you need help quitting, ask your health care provider.  Keep all follow-up visits as told by your health care provider. This is important. Contact a health care provider if:  You miss a dose of your blood thinner, if applicable.  Your symptoms do not improve.  You have unusual bruising.  You have nausea, vomiting, or diarrhea that lasts for more than one day. Get help right away if:  You have any of these problems: ? New or worse pain, swelling, or redness in an arm or leg. ? Numbness or tingling in an arm or leg. ? Shortness of breath. ? Chest pain. ? Severe pain in your abdomen. ? Fast breathing. ? A fast or irregular heartbeat. ? Blood in your vomit, stool, or urine. ? A severe headache or confusion. ? A cut that does not stop bleeding.  You feel light-headed or dizzy.  You cough up blood.  You have a serious fall or accident, or you hit your head. These symptoms may represent a serious problem that is an emergency. Do not wait to see if the symptoms will go away. Get medical help right away. Call your local emergency services (911 in the [place] Do not drive yourself to the hospital. Summary  Thrombophlebitis is a condition in which a blood clot forms in a vein. This can happen in a vein close to the surface of the body or a vein deep inside the body.  This condition can cause serious  problems when it happens in a vein deep inside the body (deep vein thrombosis, DVT).  The main symptom of this condition is swelling and pain around the affected vein.  Treatment may include warm compresses, anti-inflammatory medicines, or blood thinners. This information is not intended to replace advice given to you by your health care provider. Make sure you discuss any questions you have with your health care provider. Document Released: 09/20/2016 Document Revised: 07/17/2018 Document Reviewed: 09/20/2016 Elsevier Patient Education  2020 [employer]  
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
## 2018-12-19 NOTE — ED Notes (Signed)
Per pink sheet MD entered room at 1806

## 2018-12-19 NOTE — Telephone Encounter (Signed)
Left message with the after hour service on 12-19-18 @ 1:21 pm    Patient states that she is having trouble seeing for the past week. She states that she can see far away but not close up can not read anything feels like there is a film over her eyes  Please call

## 2018-12-19 NOTE — Discharge Instructions (Signed)
Use incentive spirometer 5 times daily   Take tylenol with codeine for severe pain. Do NOT drive with it   See your doctor for follow up in a week   Return to ER if you have worse shortness of breath, chest pain, abdominal pain, vomiting, fever

## 2018-12-19 NOTE — Progress Notes (Signed)
Patient ID: [name], female    DOB: 1975-08-20  Age: 43 y.o. MRN: [id number]    Subjective:  Subjective  HPI  [name] presents for urinary frequency , vaginal d/c -- yellow with odor- fishy She also was in detox in [place] and IV caused thrombophlebitis --she was put on abx and aspirin and told to f/u for repeat US  D/c summary reviewed from [facility]  Review of Systems  Constitutional: Negative for activity change, appetite change, fatigue and unexpected weight change.  Respiratory: Negative for cough and shortness of breath.   Cardiovascular: Negative for chest pain and palpitations.  Psychiatric/Behavioral: Negative for behavioral problems and dysphoric mood. The patient is not nervous/anxious.     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 Melvyn Novas.....-HFA 75% 12/04/2008>75% 02/05/2009>75% 08/04/2009 -PFT's 01/04/2009 2.56 (86%) ratio 75, no resp to B2 and DLC0 67% > 80 after correction    Cancer (HCC)    cervical cancer   Chronic interstitial cystitis 03/11/2009   Chronic nausea    Chronic pain    Colon polyps    COLONIC POLYPS, HX OF 07/25/2006   ADENOMATOUS POLYP   COPD (chronic obstructive pulmonary disease) ([place])    DEPRESSION 08/31/2007   Endometriosis    FIBROMYALGIA 08/31/2007   Fibromyalgia    GERD 02/05/2009   Hyperlipidemia    HYPERTENSION 08/31/2007   IBS 03/11/2009   Internal hemorrhoids    Migraine headache    NEPHROLITHIASIS 08/31/2007   PONV (postoperative nausea and vomiting)    RECTAL BLEEDING 03/11/2009   Seizures ([place])    been about 1  year since last seisure per pt   SLEEP APNEA 08/31/2007   Substance abuse ([place])    Thyroid disease    Uterine cyst     She has a past surgical history that includes Cholecystectomy; Abdominal hysterectomy; Pacemaker insertion; Bladder surgery; interstitial cystitis; bladder stretching x6; replaced bladder pacemaker; removal of uterine cyst and scrapped uterus; Colonoscopy; Colonoscopy with propofol (N/A, 09/03/2014); Esophagogastroduodenoscopy (egd) with propofol (N/A, 10/25/2018); and biopsy (10/25/2018).   Her family history includes Asthma in her maternal grandmother; Cancer in her maternal grandfather and another family member; Emphysema in her maternal grandfather; Heart disease in her father.She reports that she has been smoking cigarettes. She has a 7.00 pack-year smoking history. She has never used smokeless tobacco. She reports current alcohol use. She reports previous drug use. Drug: Marijuana.  Current Outpatient Medications on File Prior to Visit  Medication Sig Dispense Refill   acetaminophen-codeine (TYLENOL #4) 300-60 MG tablet Take 1-2 tablets by mouth every 6 (six) hours as needed for severe pain.      budesonide-formoterol (SYMBICORT) 80-4.5 MCG/ACT inhaler Inhale 2 puffs into the lungs 2 (two) times daily.      clonazePAM (KLONOPIN) 0.5 MG tablet Take 0.5 mg by mouth 2 (two) times daily as needed for anxiety. Takes 1-2 a day     FLUoxetine (PROZAC) 40 MG capsule Take 40 mg by mouth daily.     gabapentin (NEURONTIN) 300 MG capsule Take 1 capsule (300 mg  total) by mouth 2 (two) times daily. For agitation (Patient taking differently: Take 300 mg by mouth 3 (three) times daily. )     hydrOXYzine (ATARAX/VISTARIL) 25 MG tablet Take 25 mg by mouth at bedtime.      lamoTRIgine (LAMICTAL) 25 MG tablet      mirtazapine (REMERON) 45 MG tablet Take 1/2 tab at night     oxybutynin (DITROPAN) 5 MG tablet Take 5 mg by mouth every 8 (eight) hours as needed for bladder spasms.      pantoprazole (PROTONIX) 40 MG tablet Take 1 tablet (40 mg total) by mouth daily. 90 tablet 3   potassium chloride 20 MEQ/15ML (10%) SOLN Take 20 mEq by mouth daily.      PROAIR HFA 108 (90 Base) MCG/ACT inhaler INHALE 2 PUFFS EVERY 6 HOURS AS NEEDED FOR SHORTNESS OF BREATH AND WHEEZING. (Patient taking differently: Inhale 2 puffs into the lungs every 6 (six) hours as needed for wheezing or shortness of breath. ) 8.5 g 1   promethazine (PHENERGAN) 25 MG tablet TAKE 1 TABLET EVERY 6 HOURS AS NEEDED FOR NAUSEA & VOMITING. 20 tablet 5   QUEtiapine (SEROQUEL) 100 MG tablet Take 200 mg by mouth at bedtime.     rizatriptan (MAXALT-MLT) 10 MG disintegrating tablet Take 1 tablet earliest onset of migraine.  May repeat in 2 hours if needed.  Maximum 2 tablets in 24 hours (Patient taking differently: Take 10 mg by mouth daily as needed for migraine. Take 1 tablet earliest onset of migraine.  May repeat in 2 hours if needed.  Maximum 2 tablets in 24 hours) 9 tablet 3   topiramate (TOPAMAX) 50 MG tablet Take 1 tablet (50 mg total) by mouth 2 (two) times daily. 60 tablet 5   potassium chloride (KLOR-CON) 20 MEQ packet Take 20 mEq by mouth daily. (Patient not taking: Reported on 12/19/2018) 30 packet 1   [DISCONTINUED] amitriptyline (ELAVIL) 25 MG tablet 2 tablets by mouth at bedtime      [DISCONTINUED] clidinium-chlordiazePOXIDE (LIBRAX) 2.5-5 MG per capsule 2 capsules by mouth every morning and 1 at bedtime      No current facility-administered medications on file prior to visit.      Objective:  Objective  Physical Exam Vitals signs and nursing note reviewed.  Constitutional:      Appearance: She is well-developed.  HENT:     Head: Normocephalic and atraumatic.  Eyes:     Conjunctiva/sclera: Conjunctivae normal.  Neck:     Musculoskeletal: Normal range of motion and neck supple.     Thyroid: No thyromegaly.     Vascular: No carotid bruit or JVD.  Cardiovascular:     Rate and Rhythm: Normal  rate and regular rhythm.     Heart sounds: Normal heart sounds. No murmur.  Pulmonary:     Effort: Pulmonary effort is normal. No respiratory distress.     Breath sounds: Normal breath sounds. No wheezing or rales.  Chest:     Chest wall: No tenderness.  Musculoskeletal:        General: Swelling and tenderness present.     Left upper arm: She exhibits tenderness and swelling.       Arms:  Neurological:     Mental Status: She is alert and oriented to person, place, and time.    BP 100/88 (BP Location: Right Arm, Patient Position: Sitting, Cuff Size: Normal)    Pulse (!) 114    Temp 98.2 F (36.8 C) (Temporal)    Resp  18    Ht 5\' 4"  (1.626 m)    Wt 113 lb (51.3 kg)    SpO2 98%    BMI 19.40 kg/m  Wt Readings from Last 3 Encounters:  12/19/18 113 lb (51.3 kg)  10/22/18 113 lb (51.3 kg)  09/23/18 119 lb 12.8 oz (54.3 kg)     Lab Results  Component Value Date   WBC 7.0 11/28/2018   HGB 12.5 11/28/2018   HCT 38.4 11/28/2018   PLT 537 (H) 11/28/2018   GLUCOSE 124 (H) 11/28/2018   CHOL 143 04/24/2017   TRIG 85 04/24/2017   HDL 49 04/24/2017   LDLCALC 77 04/24/2017   ALT 83 (H) 11/28/2018   AST 148 (H) 11/28/2018   NA 145 11/28/2018   K 2.5 (LL) 11/28/2018   CL 105 11/28/2018   CREATININE 0.76 11/28/2018   BUN <5 (L) 11/28/2018   CO2 24 11/28/2018   TSH 1.453 09/10/2018   INR 0.9 08/24/2018   HGBA1C 5.0 04/24/2017    Dg Chest 2 View  Result Date: 11/28/2018 CLINICAL DATA:  Fall, right wrist pain, history of cervical cancer, intoxication EXAM: CHEST - 2 VIEW COMPARISON:  Radiograph 09/10/2018 FINDINGS: No consolidation, features of edema, pneumothorax, or effusion. Pulmonary vascularity is normally distributed. The cardiomediastinal contours are unremarkable. No acute osseous or soft tissue abnormality. IMPRESSION: No acute cardiopulmonary abnormality. Electronically Signed   By: Lovena Le M.D.   On: 11/28/2018 21:46   Dg Sacrum/coccyx  Result Date: 11/28/2018 CLINICAL  DATA:  Fall, intoxication EXAM: SACRUM AND COCCYX - 2+ VIEW COMPARISON:  CT abdomen pelvis 01/17/2018 FINDINGS: Sacral nerve stimulator battery pack projects over the left pelvis with lead positioned at the third left sacral foramen. Portions of the sacrum are poorly visualized due to overlying bowel gas. The arcuate lines appear contiguous. Abrupt angulation at the sacrococcygeal junction is unchanged from comparison. Remaining bones of the pelvis are congruent. There is a chronic superior end plate compression deformity at L5 which is also unchanged from prior. No acute fracture or traumatic listhesis is identified. IMPRESSION: No acute osseous abnormality. Coccygeal angulation is stable from comparison, likely remote fracture. Unchanged compression deformity of the superior endplate L5. Electronically Signed   By: Lovena Le M.D.   On: 11/28/2018 21:48   Dg Wrist Complete Right  Result Date: 11/28/2018 CLINICAL DATA:  Fall, wrist pain, intoxication EXAM: RIGHT WRIST - COMPLETE 3+ VIEW COMPARISON:  Radiographs 11/15/2004 FINDINGS: Corticated remote nonunion fracture of the ulnar styloid process. No acute fracture or traumatic malalignment.Soft tissues are unremarkable. Minimal triscaphe arthrosis. IMPRESSION: No acute fracture or traumatic malalignment. Electronically Signed   By: Lovena Le M.D.   On: 11/28/2018 21:50   Ct Head Wo Contrast  Result Date: 11/28/2018 CLINICAL DATA:  Fall, intoxicated, dizziness EXAM: CT HEAD WITHOUT CONTRAST TECHNIQUE: Contiguous axial images were obtained from the base of the skull through the vertex without intravenous contrast. COMPARISON:  CT head 08/23/2018 FINDINGS: Brain: No evidence of acute infarction, hemorrhage, hydrocephalus, extra-axial collection or mass lesion/mass effect. Vascular: No hyperdense vessel or unexpected calcification. Skull: No calvarial fracture or suspicious osseous lesion. No scalp swelling or hematoma. Sinuses/Orbits: Paranasal sinuses  and mastoid air cells are predominantly clear. Orbital structures are unremarkable. Other: None IMPRESSION: No acute intracranial abnormality Electronically Signed   By: Lovena Le M.D.   On: 11/28/2018 21:53     Assessment & Plan:  Plan  I have discontinued [name]'s vancomycin. I am also having her start on  omeprazole and metroNIDAZOLE. Additionally, I am having her maintain her budesonide-formoterol, gabapentin, hydrOXYzine, clonazePAM, acetaminophen-codeine, rizatriptan, ProAir HFA, topiramate, pantoprazole, FLUoxetine, QUEtiapine, oxybutynin, potassium chloride, potassium chloride, promethazine, lamoTRIgine, and mirtazapine.  Meds ordered this encounter  Medications   omeprazole (PRILOSEC) 40 MG capsule    Sig: Take 1 capsule (40 mg total) by mouth daily.    Dispense:  30 capsule    Refill:  3   metroNIDAZOLE (FLAGYL) 500 MG tablet    Sig: Take 1 tablet (500 mg total) by mouth 2 (two) times daily.    Dispense:  14 tablet    Refill:  0    Problem List Items Addressed This Visit      Unprioritized   GERD   Relevant Medications   omeprazole (PRILOSEC) 40 MG capsule    Other Visit Diagnoses    Urinary frequency    -  Primary   Relevant Orders   POCT urinalysis dipstick (Completed)   Urine Culture   Acute vaginitis       Relevant Medications   metroNIDAZOLE (FLAGYL) 500 MG tablet   Other Relevant Orders   Urine cytology ancillary only([place])   High risk heterosexual behavior       Relevant Orders   HIV antibody (with reflex)   RPR   Hepatitis B surface antibody,quantitative   Hepatitis B Surface AntiGEN   Hepatitis C antibody   HSV 2 antibody, IgG   Thrombophlebitis arm       Relevant Orders   VAS [place] UPPER EXTREMITY VENOUS DUPLEX      Follow-up: Return if symptoms worsen or fail to improve.  [name], DO
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
## 2018-12-19 NOTE — ED Notes (Signed)
Pt currently in CT.

## 2018-12-20 LAB — SAMPLE TO BLOOD BANK

## 2018-12-21 LAB — URINE CULTURE
MICRO NUMBER:: 866459
Result:: NO GROWTH
SPECIMEN QUALITY:: ADEQUATE

## 2018-12-21 LAB — URINE CYTOLOGY ANCILLARY ONLY
Chlamydia: NEGATIVE
Neisseria Gonorrhea: NEGATIVE
Trichomonas: NEGATIVE

## 2018-12-23 ENCOUNTER — Other Ambulatory Visit: Payer: Self-pay | Admitting: Family Medicine

## 2018-12-23 DIAGNOSIS — B9689 Other specified bacterial agents as the cause of diseases classified elsewhere: Secondary | ICD-10-CM

## 2018-12-23 LAB — URINE CYTOLOGY ANCILLARY ONLY
Bacterial vaginitis: POSITIVE — AB
Candida vaginitis: NEGATIVE

## 2018-12-23 MED ORDER — METRONIDAZOLE 500 MG PO TABS: 500.0000 mg | ORAL_TABLET | Freq: Two times a day (BID) | ORAL | 0 refills | Status: DC
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
## 2018-12-23 NOTE — Telephone Encounter (Signed)
noted 

## 2018-12-23 NOTE — Telephone Encounter (Signed)
She is scheduled for appointment with me tomorrow
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
## 2018-12-24 ENCOUNTER — Ambulatory Visit: Payer: BC Managed Care – PPO | Admitting: Neurology

## 2018-12-25 ENCOUNTER — Ambulatory Visit (HOSPITAL_BASED_OUTPATIENT_CLINIC_OR_DEPARTMENT_OTHER): Payer: BC Managed Care – PPO | Attending: Family Medicine

## 2019-01-07 ENCOUNTER — Ambulatory Visit: Payer: BLUE CROSS/BLUE SHIELD | Admitting: Neurology

## 2019-01-14 ENCOUNTER — Ambulatory Visit: Payer: BC Managed Care – PPO | Admitting: Family

## 2019-01-14 ENCOUNTER — Other Ambulatory Visit: Payer: Self-pay

## 2019-01-14 ENCOUNTER — Encounter: Payer: Self-pay | Admitting: Family

## 2019-01-14 VITALS — BP 111/88 | HR 99 | Temp 98.2°F | Resp 16 | Ht 64.0 in | Wt 113.0 lb

## 2019-01-14 DIAGNOSIS — R350 Frequency of micturition: Secondary | ICD-10-CM | POA: Diagnosis not present

## 2019-01-14 DIAGNOSIS — R0989 Other specified symptoms and signs involving the circulatory and respiratory systems: Secondary | ICD-10-CM | POA: Diagnosis not present

## 2019-01-14 LAB — POC URINALSYSI DIPSTICK (AUTOMATED)
Bilirubin, UA: NEGATIVE
Blood, UA: NEGATIVE
Glucose, UA: NEGATIVE
Ketones, UA: NEGATIVE
Leukocytes, UA: NEGATIVE
Nitrite, UA: NEGATIVE
Protein, UA: NEGATIVE
Spec Grav, UA: 1.01 (ref 1.010–1.025)
Urobilinogen, UA: 0.2 E.U./dL
pH, UA: 6.5 (ref 5.0–8.0)

## 2019-01-14 NOTE — Progress Notes (Signed)
Subjective:    Patient ID: [name], female    DOB: 1975-11-21, 43 y.o.   MRN: [id number]  HPI  Patient is a 43 yr old female who presents today with c/o urinary frequency/urgency. Denies unusual low back pain.  Reports + hx of interstitial cystitis.    Chest congestion- reports + chest congestion. Denies cough.  Reports that this started about 2 weeks ago.  She reports + hx of COPD. Denies fever, body aches, nausea/vomitting or diarrhea.     Review of Systems   See HPI Past Medical History:  Diagnosis Date  . 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 Anal fissure 03/11/2009  . Anemia   . Anxiety   . Anxiety and depression   . ARTHRITIS 08/31/2007  . Arthritis   . Asthma   . 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 Bipolar 1 disorder ([place])   . Bowel obstruction ([place])   . Breakdown of urinary electronic stimulator device, init ([place])   . BRONCHITIS, RECURRENT 08/23/2009   Asthmatic Bronchitis-Dr. Melvyn Novas.....-HFA 75% 12/04/2008>75% 02/05/2009>75% 08/04/2009 -PFT's 01/04/2009 2.56 (86%) ratio 75, no resp to B2 and DLC0 67% > 80 after correction   . Cancer (HCC)    cervical cancer  . Chronic interstitial cystitis 03/11/2009  . Chronic nausea   . Chronic pain   . Colon polyps   . COLONIC POLYPS, HX OF 07/25/2006   ADENOMATOUS POLYP  . COPD (chronic obstructive pulmonary disease) ([place])   . DEPRESSION 08/31/2007  . Endometriosis   . FIBROMYALGIA 08/31/2007  . Fibromyalgia   . GERD 02/05/2009  . Hyperlipidemia   . HYPERTENSION 08/31/2007  . IBS 03/11/2009  . Internal hemorrhoids   . Migraine headache   . 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 PONV (postoperative nausea and vomiting)   . RECTAL BLEEDING 03/11/2009  . Seizures ([place])    been about 1 year since last seisure per pt  . SLEEP APNEA 08/31/2007  . Substance abuse ([place])   . Thyroid disease   . Uterine cyst      Social History   Socioeconomic History  . Marital status: Married    Spouse name: Not on file  . Number of children:  2  . Years of education: Not on file  . Highest education level: Not on file  Occupational History    Employer: UNEMPLOYED  Social Needs  . Financial resource strain: Not on file  . Food insecurity    Worry: Not on file    Inability: Not on file  . Transportation needs    Medical: Not on file    Non-medical: Not on file  Tobacco Use  . Smoking status: Current Every Day Smoker    Packs/day: 0.50    Years: 14.00    Pack years: 7.00    Types: Cigarettes  . Smokeless tobacco: Never Used  Substance and Sexual Activity  . Alcohol use: Not Currently    Comment: last drink 3 weeks ago; recently released from rehab  . Drug use: Not Currently    Types: Marijuana    Comment: once a month  . Sexual activity: Not on file  Lifestyle  . Physical activity    Days per week: Not on file    Minutes per session: Not on file  . Stress: Not on file  Relationships  . Social Herbalist on phone: Not on file    Gets together: Not on file    Attends religious service: Not on file    Active  member of club or organization: Not on file    Attends meetings of clubs or organizations: Not on file    Relationship status: Not on file  . Intimate partner violence    Fear of current or ex partner: Not on file    Emotionally abused: Not on file    Physically abused: Not on file    Forced sexual activity: Not on file  Other Topics Concern  . Not on file  Social History Narrative   Homemaker   Daily Caffeine Use-Mtn. Dew          Past Surgical History:  Procedure Laterality Date  . ABDOMINAL HYSTERECTOMY    . BIOPSY  10/25/2018   Procedure: BIOPSY;  Surgeon: Rogene Houston, MD;  Location: [facility];  Service: Endoscopy;;  gastric  . bladder stretching x6    . BLADDER SURGERY     stimulator placed and stretching   . CHOLECYSTECTOMY    . COLONOSCOPY    . COLONOSCOPY WITH PROPOFOL N/A 09/03/2014   Procedure: COLONOSCOPY WITH PROPOFOL;  Surgeon: Milus Banister, MD;  Location: [facility]  ENDOSCOPY;  Service: Endoscopy;  Laterality: N/A;  . ESOPHAGOGASTRODUODENOSCOPY (EGD) WITH PROPOFOL N/A 10/25/2018   Procedure: ESOPHAGOGASTRODUODENOSCOPY (EGD) WITH PROPOFOL;  Surgeon: Rogene Houston, MD;  Location: [facility];  Service: Endoscopy;  Laterality: N/A;  11:15  . interstitial cystitis    . PACEMAKER INSERTION     in hip for interstitial cystitis  . removal of uterine cyst and scrapped uterus    . replaced bladder pacemaker      Family History  Problem Relation Age of Onset  . Heart disease Father   . Asthma Maternal Grandmother   . Emphysema Maternal Grandfather   . Cancer Maternal Grandfather        Lung Cancer  . Cancer Other        Lung Cancer-Aunt  . Colon cancer Neg Hx   . Esophageal cancer Neg Hx   . Rectal cancer Neg Hx   . Stomach cancer Neg Hx   . Thyroid disease Neg Hx     Allergies  Allergen Reactions  . Abilify [Aripiprazole] Swelling, Palpitations and Other (See Comments)    Throat swelling, tremors   . Amitriptyline Anaphylaxis    Angioedema  . Metoclopramide Hcl Other (See Comments)    Causes seizures  . Propoxyphene Rash  . Tramadol Swelling, Other (See Comments) and Rash    Throat swelling, tremors  . Ambien [Zolpidem Tartrate] Other (See Comments)    hallucinations  . Eszopiclone Other (See Comments)    Hallucinations, hyperactivity, and bad taste in mouth   . Codeine Other (See Comments)    Tylenol #3 caused itching  . Varenicline Other (See Comments)    Suicidal thoughts  . Buprenorphine Hcl Itching and Hives  . Demerol [Meperidine] Rash  . Emetrol Hives, Itching and Rash  . Morphine And Related Hives and Itching    Current Outpatient Medications on File Prior to Visit  Medication Sig Dispense Refill  . acetaminophen-codeine (TYLENOL #4) 300-60 MG tablet Take 1 tablet by mouth every 4 (four) hours as needed for moderate pain. 10 tablet 0  . budesonide-formoterol (SYMBICORT) 80-4.5 MCG/ACT inhaler Inhale 2 puffs into the  lungs 2 (two) times daily.     Marland Kitchen FLUoxetine (PROZAC) 40 MG capsule Take 40 mg by mouth daily.    Marland Kitchen gabapentin (NEURONTIN) 300 MG capsule Take 1 capsule (300 mg total) by mouth 2 (two) times daily.  For agitation (Patient taking differently: Take 300 mg by mouth 3 (three) times daily. )    . hydrOXYzine (ATARAX/VISTARIL) 25 MG tablet Take 25 mg by mouth at bedtime.     . lamoTRIgine (LAMICTAL) 25 MG tablet Take 25 mg by mouth at bedtime.     . mirtazapine (REMERON) 45 MG tablet Take 22.5 mg by mouth at bedtime.     Marland Kitchen oxybutynin (DITROPAN) 5 MG tablet Take 5 mg by mouth every 8 (eight) hours as needed for bladder spasms.    . pantoprazole (PROTONIX) 40 MG tablet Take 1 tablet (40 mg total) by mouth daily. 90 tablet 3  . potassium chloride 20 MEQ/15ML (10%) SOLN Take 20 mEq by mouth daily.     Marland Kitchen PROAIR HFA 108 (90 Base) MCG/ACT inhaler INHALE 2 PUFFS EVERY 6 HOURS AS NEEDED FOR SHORTNESS OF BREATH AND WHEEZING. (Patient taking differently: Inhale 2 puffs into the lungs every 6 (six) hours as needed for wheezing or shortness of breath. ) 8.5 g 1  . QUEtiapine (SEROQUEL) 100 MG tablet Take 400 mg by mouth at bedtime.     . rizatriptan (MAXALT-MLT) 10 MG disintegrating tablet Take 1 tablet earliest onset of migraine.  May repeat in 2 hours if needed.  Maximum 2 tablets in 24 hours (Patient taking differently: Take 10 mg by mouth daily as needed for migraine. Take 1 tablet earliest onset of migraine.  May repeat in 2 hours if needed.  Maximum 2 tablets in 24 hours) 9 tablet 3  . topiramate (TOPAMAX) 50 MG tablet Take 1 tablet (50 mg total) by mouth 2 (two) times daily. 60 tablet 5  . [DISCONTINUED] amitriptyline (ELAVIL) 25 MG tablet 2 tablets by mouth at bedtime     . [DISCONTINUED] clidinium-chlordiazePOXIDE (LIBRAX) 2.5-5 MG per capsule 2 capsules by mouth every morning and 1 at bedtime      No current facility-administered medications on file prior to visit.     BP 111/88 (BP Location: Right Arm,  Patient Position: Sitting, Cuff Size: Small)   Pulse 99   Temp 98.2 F (36.8 C) (Temporal)   Resp 16   Ht 5\' 4"  (1.626 m)   Wt 113 lb (51.3 kg)   SpO2 100%   BMI 19.40 kg/m       Objective:   Physical Exam Constitutional:      Appearance: She is underweight.     Comments: + suprapubic tenderess   Neck:     Musculoskeletal: Neck supple.     Thyroid: No thyromegaly.  Cardiovascular:     Rate and Rhythm: Normal rate and regular rhythm.     Heart sounds: Normal heart sounds. No murmur.  Pulmonary:     Effort: Pulmonary effort is normal. No respiratory distress.     Breath sounds: Normal breath sounds. No wheezing.  Skin:    General: Skin is warm and dry.  Neurological:     Mental Status: She is alert and oriented to person, place, and time.  Psychiatric:        Behavior: Behavior normal.        Thought Content: Thought content normal.        Judgment: Judgment normal.     Comments: Nervous appearing           Assessment & Plan:  Urinary frequency- ? Due to IC versus infection. She reports that her suprapubic tenderness is at baseline.  UA is unremarkable. Will obtain urine culture. Plan to treat if +.  Chest congestion- ? Secondary to COPD. Recommended CXR to rule out PNA.  Recommended prn mucinex.

## 2019-01-14 NOTE — Patient Instructions (Addendum)
Please complete chest x-ray on the first floor. You may add mucinex as needed for chest congestion. We will let you know how your urine culture turns out. Please call if symptoms worsen or if symptoms do not improve in the next 1 week.

## 2019-01-16 ENCOUNTER — Telehealth: Payer: Self-pay | Admitting: Family

## 2019-01-16 LAB — URINE CULTURE
MICRO NUMBER:: 959314
SPECIMEN QUALITY:: ADEQUATE

## 2019-01-16 MED ORDER — CIPROFLOXACIN HCL 500 MG PO TABS: 500.0000 mg | ORAL_TABLET | Freq: Two times a day (BID) | ORAL | 0 refills | Status: DC

## 2019-01-16 NOTE — Telephone Encounter (Signed)
Patient advised of culture and to pick up antibiotics.

## 2019-01-16 NOTE — Telephone Encounter (Signed)
Urine culture shows multiple types of bacteria.  Since she is having symptoms, I would like for her to start an antibiotic.  Rx sent to her pharmacy.

## 2019-01-29 DIAGNOSIS — Z87448 Personal history of other diseases of urinary system: Secondary | ICD-10-CM | POA: Diagnosis not present

## 2019-01-29 DIAGNOSIS — Z79891 Long term (current) use of opiate analgesic: Secondary | ICD-10-CM | POA: Diagnosis not present

## 2019-02-11 ENCOUNTER — Other Ambulatory Visit: Payer: Self-pay | Admitting: Neurology

## 2019-02-11 NOTE — Telephone Encounter (Signed)
Requested Prescriptions   Pending Prescriptions Disp Refills  . promethazine (PHENERGAN) 25 MG tablet [Pharmacy Med Name: PROMETHAZINE 25 MG TABLET] 20 tablet 0    Sig: TAKE 1 TABLET EVERY 6 HOURS AS NEEDED FOR NAUSEA & VOMITING.   Rx last filled: not on current medication Provider approved this Rx on 09/05/18   Pt last seen: 09/03/18  Follow up appt scheduled:none

## 2019-03-12 DIAGNOSIS — F314 Bipolar disorder, current episode depressed, severe, without psychotic features: Secondary | ICD-10-CM | POA: Diagnosis not present

## 2019-03-12 DIAGNOSIS — F411 Generalized anxiety disorder: Secondary | ICD-10-CM | POA: Diagnosis not present

## 2019-03-20 ENCOUNTER — Other Ambulatory Visit: Payer: Self-pay

## 2019-03-20 ENCOUNTER — Encounter: Payer: Self-pay | Admitting: Family Medicine

## 2019-03-20 ENCOUNTER — Ambulatory Visit (INDEPENDENT_AMBULATORY_CARE_PROVIDER_SITE_OTHER): Payer: BC Managed Care – PPO | Admitting: Family Medicine

## 2019-03-20 DIAGNOSIS — R112 Nausea with vomiting, unspecified: Secondary | ICD-10-CM | POA: Diagnosis not present

## 2019-03-20 DIAGNOSIS — Z20822 Contact with and (suspected) exposure to covid-19: Secondary | ICD-10-CM | POA: Insufficient documentation

## 2019-03-20 DIAGNOSIS — Z20828 Contact with and (suspected) exposure to other viral communicable diseases: Secondary | ICD-10-CM

## 2019-03-20 DIAGNOSIS — G43809 Other migraine, not intractable, without status migrainosus: Secondary | ICD-10-CM

## 2019-03-20 MED ORDER — TOPIRAMATE 50 MG PO TABS: 50.0000 mg | ORAL_TABLET | Freq: Two times a day (BID) | ORAL | 5 refills | Status: DC

## 2019-03-20 NOTE — Progress Notes (Signed)
Virtual Visit via Video Note  I connected with NYELLIE WINDING on 03/20/19 at  3:40 PM EST by a video enabled telemedicine application and verified that I am speaking with the correct person using two identifiers.  Location: Patient: office  Provider: home alone    I discussed the limitations of evaluation and management by telemedicine and the availability of in person appointments. The patient expressed understanding and agreed to proceed.  History of Present Illness: Pt is home c/o 12 day hx of nausea and vomiting.  She is able to hold water down but nothing else.   She has lost taste / smell.  Everything she eats tastes bitter.     Observations/Objective: No vitals obtained Pt in nad Assessment and Plan: 1. Other migraine without status migrainosus, not intractable Refilled meds  - topiramate (TOPAMAX) 50 MG tablet; Take 1 tablet (50 mg total) by mouth 2 (two) times daily.  Dispense: 60 tablet; Refill: 5  2. Nausea and vomiting, intractability of vomiting not specified, unspecified vomiting type 12 days of only water and vomiting And loss of taste/ smell Pt advised to go to ER for covid testing and blood work   Follow Up Instructions:    I discussed the assessment and treatment plan with the patient. The patient was provided an opportunity to ask questions and all were answered. The patient agreed with the plan and demonstrated an understanding of the instructions.   The patient was advised to call back or seek an in-person evaluation if the symptoms worsen or if the condition fails to improve as anticipated.  I provided 15 minutes of non-face-to-face time during this encounter.   [name], DO

## 2019-04-14 DIAGNOSIS — N952 Postmenopausal atrophic vaginitis: Secondary | ICD-10-CM | POA: Diagnosis not present

## 2019-04-14 DIAGNOSIS — N951 Menopausal and female climacteric states: Secondary | ICD-10-CM | POA: Diagnosis not present

## 2019-04-14 DIAGNOSIS — B009 Herpesviral infection, unspecified: Secondary | ICD-10-CM | POA: Diagnosis not present

## 2019-04-14 DIAGNOSIS — R399 Unspecified symptoms and signs involving the genitourinary system: Secondary | ICD-10-CM | POA: Diagnosis not present

## 2019-04-14 DIAGNOSIS — N760 Acute vaginitis: Secondary | ICD-10-CM | POA: Diagnosis not present

## 2019-04-22 ENCOUNTER — Encounter: Payer: Self-pay | Admitting: Family Medicine

## 2019-04-22 ENCOUNTER — Telehealth: Payer: Self-pay | Admitting: Family Medicine

## 2019-04-22 ENCOUNTER — Other Ambulatory Visit: Payer: Self-pay

## 2019-04-22 ENCOUNTER — Ambulatory Visit (INDEPENDENT_AMBULATORY_CARE_PROVIDER_SITE_OTHER): Payer: BC Managed Care – PPO | Admitting: Family Medicine

## 2019-04-22 ENCOUNTER — Other Ambulatory Visit: Payer: Self-pay | Admitting: Neurology

## 2019-04-22 VITALS — Ht 64.0 in | Wt 91.0 lb

## 2019-04-22 DIAGNOSIS — B9689 Other specified bacterial agents as the cause of diseases classified elsewhere: Secondary | ICD-10-CM | POA: Diagnosis not present

## 2019-04-22 DIAGNOSIS — N760 Acute vaginitis: Secondary | ICD-10-CM

## 2019-04-22 DIAGNOSIS — J40 Bronchitis, not specified as acute or chronic: Secondary | ICD-10-CM

## 2019-04-22 DIAGNOSIS — J0140 Acute pansinusitis, unspecified: Secondary | ICD-10-CM

## 2019-04-22 MED ORDER — FLUTICASONE PROPIONATE 50 MCG/ACT NA SUSP: 2.0000 | Freq: Every day | NASAL | 6 refills | Status: AC

## 2019-04-22 MED ORDER — METRONIDAZOLE 0.75 % VA GEL: 1.0000 | Freq: Two times a day (BID) | VAGINAL | 0 refills | Status: DC

## 2019-04-22 MED ORDER — AZITHROMYCIN 250 MG PO TABS: ORAL_TABLET | ORAL | 0 refills | Status: DC

## 2019-04-22 MED ORDER — PROMETHAZINE-DM 6.25-15 MG/5ML PO SYRP: 5.0000 mL | ORAL_SOLUTION | Freq: Four times a day (QID) | ORAL | 0 refills | Status: DC | PRN

## 2019-04-22 NOTE — Telephone Encounter (Signed)
Copied from [place] [id number]. Topic: General - Other >> Apr 22, 2019  4:22 PM Greggory Keen D wrote: Reason for CRM: Cloyde Reams with [employer] has a question regarding the Metronidazole cream and how it is written to dispense.  She says there are only 5 applications in the cream and it is usually dispensed one at night for five nights  CB# for [phone]

## 2019-04-22 NOTE — Progress Notes (Signed)
Virtual Visit via Video Note  I connected with [name] on 04/22/19 at  1:00 PM EST by a video enabled telemedicine application and verified that I am speaking with the correct person using two identifiers.  Location: Patient: home alone  Provider: home    I discussed the limitations of evaluation and management by telemedicine and the availability of in person appointments. The patient expressed understanding and agreed to proceed.  History of Present Illness: Pt is home c/o loss of appetite, taste etc about 2 mouths ago --- it improved but about 4 weeks ago it came back --- no taste,  +n/v  No diarrhea  + chest congestion  Prod cough No fever    Observations/Objective: Vitals:   04/22/19 1306  Weight: 91 lb (41.3 kg)  Height: 5\' 4"  (1.626 m)  pt is in NAD   Assessment and Plan: 1. Acute non-recurrent pansinusitis abx per orders flonase  Call if symptoms do notimprove--- consider resp clinic-- if sob -- go to ER - fluticasone (FLONASE) 50 MCG/ACT nasal spray; Place 2 sprays into both nostrils daily.  Dispense: 16 g; Refill: 6  2. Bronchitis abx per orders con't inhalers prn If symptoms worsen go to ER Pt will get covid test  - azithromycin (ZITHROMAX Z-PAK) 250 MG tablet; As directed  Dispense: 6 each; Refill: 0 - promethazine-dextromethorphan (PROMETHAZINE-DM) 6.25-15 MG/5ML syrup; Take 5 mLs by mouth 4 (four) times daily as needed.  Dispense: 118 mL; Refill: 0  3. Bacterial vaginitis Flagyl po making her sick---  Will change to metrogel  F/u gyn prn  - metroNIDAZOLE (METROGEL VAGINAL) 0.75 % vaginal gel; Place 1 Applicatorful vaginally 2 (two) times daily.  Dispense: 70 g; Refill: 0   Follow Up Instructions:    I discussed the assessment and treatment plan with the patient. The patient was provided an opportunity to ask questions and all were answered. The patient agreed with the plan and demonstrated an understanding of the instructions.   The patient was  advised to call back or seek an in-person evaluation if the symptoms worsen or if the condition fails to improve as anticipated.     [name], DO

## 2019-04-25 NOTE — Telephone Encounter (Signed)
Fixed.

## 2019-05-01 ENCOUNTER — Other Ambulatory Visit: Payer: Self-pay

## 2019-05-01 ENCOUNTER — Ambulatory Visit (INDEPENDENT_AMBULATORY_CARE_PROVIDER_SITE_OTHER): Payer: BC Managed Care – PPO | Admitting: Family Medicine

## 2019-05-01 ENCOUNTER — Encounter: Payer: Self-pay | Admitting: Family Medicine

## 2019-05-01 DIAGNOSIS — R634 Abnormal weight loss: Secondary | ICD-10-CM

## 2019-05-01 DIAGNOSIS — F419 Anxiety disorder, unspecified: Secondary | ICD-10-CM

## 2019-05-01 DIAGNOSIS — R110 Nausea: Secondary | ICD-10-CM | POA: Diagnosis not present

## 2019-05-01 MED ORDER — CLONAZEPAM 0.5 MG PO TABS: 0.5000 mg | ORAL_TABLET | Freq: Three times a day (TID) | ORAL | 1 refills | Status: DC | PRN

## 2019-05-01 NOTE — Progress Notes (Signed)
Virtual Visit via Video Note  I connected with [name] on 05/01/19 at  9:40 AM EST by a video enabled telemedicine application and verified that I am speaking with the correct person using two identifiers.  Location: Patient: home with daughter  Provider: office   I discussed the limitations of evaluation and management by telemedicine and the availability of in person appointments. The patient expressed understanding and agreed to proceed.  History of Present Illness:   pt is switching her psychiatrist and has an appointment march 25 but needs a refill on her klonopin until that appointment  Pt also still c/o nausea and weight loss -- meds do not seem to be helping   Observations/Objective: There were no vitals filed for this visit.  Pt is in nad   Assessment and Plan: 1. Anxiety Refill clonazepam until app with new psych  - clonazePAM (KLONOPIN) 0.5 MG tablet; Take 1 tablet (0.5 mg total) by mouth 3 (three) times daily as needed for anxiety.  Dispense: 90 tablet; Refill: 1  2. Nausea Ongoing Refer to gi  - Ambulatory referral to Gastroenterology  3. Weight loss Ongoing Refer to GI - Ambulatory referral to Gastroenterology   Follow Up Instructions:    I discussed the assessment and treatment plan with the patient. The patient was provided an opportunity to ask questions and all were answered. The patient agreed with the plan and demonstrated an understanding of the instructions.   The patient was advised to call back or seek an in-person evaluation if the symptoms worsen or if the condition fails to improve as anticipated.  I provided 20 minutes of non-face-to-face time during this encounter.   [name], DO

## 2019-05-03 ENCOUNTER — Other Ambulatory Visit: Payer: Self-pay

## 2019-05-03 ENCOUNTER — Encounter (HOSPITAL_COMMUNITY): Payer: Self-pay

## 2019-05-03 ENCOUNTER — Emergency Department (HOSPITAL_COMMUNITY)
Admission: EM | Admit: 2019-05-03 | Discharge: 2019-05-03 | Disposition: A | Discharge status: Home / Self Care | Payer: BC Managed Care – PPO | Attending: Emergency Medicine | Admitting: Emergency Medicine

## 2019-05-03 DIAGNOSIS — Z950 Presence of cardiac pacemaker: Secondary | ICD-10-CM | POA: Insufficient documentation

## 2019-05-03 DIAGNOSIS — Z8541 Personal history of malignant neoplasm of cervix uteri: Secondary | ICD-10-CM | POA: Insufficient documentation

## 2019-05-03 DIAGNOSIS — Z79899 Other long term (current) drug therapy: Secondary | ICD-10-CM | POA: Diagnosis not present

## 2019-05-03 DIAGNOSIS — R519 Headache, unspecified: Secondary | ICD-10-CM | POA: Insufficient documentation

## 2019-05-03 DIAGNOSIS — F17210 Nicotine dependence, cigarettes, uncomplicated: Secondary | ICD-10-CM | POA: Insufficient documentation

## 2019-05-03 DIAGNOSIS — R110 Nausea: Secondary | ICD-10-CM | POA: Insufficient documentation

## 2019-05-03 DIAGNOSIS — J449 Chronic obstructive pulmonary disease, unspecified: Secondary | ICD-10-CM | POA: Diagnosis not present

## 2019-05-03 DIAGNOSIS — H53149 Visual discomfort, unspecified: Secondary | ICD-10-CM | POA: Diagnosis not present

## 2019-05-03 LAB — COMPREHENSIVE METABOLIC PANEL
ALT: 39 U/L (ref 0–44)
AST: 52 U/L — ABNORMAL HIGH (ref 15–41)
Albumin: 4 g/dL (ref 3.5–5.0)
Alkaline Phosphatase: 83 U/L (ref 38–126)
Anion gap: 8 (ref 5–15)
BUN: 16 mg/dL (ref 6–20)
CO2: 26 mmol/L (ref 22–32)
Calcium: 9.2 mg/dL (ref 8.9–10.3)
Chloride: 108 mmol/L (ref 98–111)
Creatinine, Ser: 0.7 mg/dL (ref 0.44–1.00)
GFR calc Af Amer: >60 mL/min (ref >60)
GFR calc non Af Amer: >60 mL/min (ref >60)
Glucose, Bld: 79 mg/dL (ref 70–99)
Potassium: 3.9 mmol/L (ref 3.5–5.1)
Sodium: 142 mmol/L (ref 135–145)
Total Bilirubin: 0.5 mg/dL (ref 0.3–1.2)
Total Protein: 7.6 g/dL (ref 6.5–8.1)

## 2019-05-03 LAB — CBC WITH DIFFERENTIAL/PLATELET
Abs Immature Granulocytes: 0.02 10*3/uL (ref 0.00–0.07)
Basophils Absolute: 0.1 10*3/uL (ref 0.0–0.1)
Basophils Relative: 1 %
Eosinophils Absolute: 0.2 10*3/uL (ref 0.0–0.5)
Eosinophils Relative: 3 %
HCT: 38.6 % (ref 36.0–46.0)
Hemoglobin: 12.3 g/dL (ref 12.0–15.0)
Immature Granulocytes: 0 %
Lymphocytes Relative: 45 %
Lymphs Abs: 4 10*3/uL (ref 0.7–4.0)
MCH: 32.1 pg (ref 26.0–34.0)
MCHC: 31.9 g/dL (ref 30.0–36.0)
MCV: 100.8 fL — ABNORMAL HIGH (ref 80.0–100.0)
Monocytes Absolute: 0.5 10*3/uL (ref 0.1–1.0)
Monocytes Relative: 6 %
Neutro Abs: 4 10*3/uL (ref 1.7–7.7)
Neutrophils Relative %: 45 %
Platelets: 424 10*3/uL — ABNORMAL HIGH (ref 150–400)
RBC: 3.83 MIL/uL — ABNORMAL LOW (ref 3.87–5.11)
RDW: 15.3 % (ref 11.5–15.5)
WBC: 8.8 10*3/uL (ref 4.0–10.5)
nRBC: 0 % (ref 0.0–0.2)

## 2019-05-03 LAB — MAGNESIUM: Magnesium: 2.1 mg/dL (ref 1.7–2.4)

## 2019-05-03 MED ORDER — PROCHLORPERAZINE EDISYLATE 10 MG/2ML IJ SOLN
10.0000 mg | Freq: Once | INTRAMUSCULAR | Status: AC
  Administered 2019-05-03: 10 mg via INTRAVENOUS
  Filled 2019-05-03: qty 2

## 2019-05-03 MED ORDER — SODIUM CHLORIDE 0.9 % IV BOLUS
1000.0000 mL | Freq: Once | INTRAVENOUS | Status: AC
  Administered 2019-05-03: 1000 mL via INTRAVENOUS

## 2019-05-03 MED ORDER — KETOROLAC TROMETHAMINE 15 MG/ML IJ SOLN
15.0000 mg | Freq: Once | INTRAMUSCULAR | Status: AC
  Administered 2019-05-03: 15 mg via INTRAVENOUS
  Filled 2019-05-03: qty 1

## 2019-05-03 MED ORDER — DEXAMETHASONE SODIUM PHOSPHATE 10 MG/ML IJ SOLN
10.0000 mg | Freq: Once | INTRAMUSCULAR | Status: AC
  Administered 2019-05-03: 10 mg via INTRAVENOUS
  Filled 2019-05-03: qty 1

## 2019-05-03 MED ORDER — DIPHENHYDRAMINE HCL 50 MG/ML IJ SOLN
50.0000 mg | Freq: Once | INTRAMUSCULAR | Status: AC
  Administered 2019-05-03: 15:00:00 50 mg via INTRAVENOUS
  Filled 2019-05-03: qty 1

## 2019-05-03 NOTE — Discharge Instructions (Signed)
Headache Your lab results showed no acute abnormalities.  For future headaches please try the following regimen: Antiinflammatory medications: Take 600 mg of ibuprofen every 6 hours or 440 mg (over the counter dose) to 500 mg (prescription dose) of naproxen every 12 hours.  Use this regimen until headache subsides for up to 3 days .  After this time, these medications may be used as needed for pain. Take these medications with food to avoid upset stomach. Choose only one of these medications, do not take them together. Acetaminophen: Should you continue to have additional pain while taking the ibuprofen or naproxen, you may add in acetaminophen (generic for Tylenol) as needed. Your daily total maximum amount of acetaminophen from all sources should be limited to 4000mg /day for persons without liver problems, or 2000mg /day for those with liver problems.  Hydration: Have a goal of about a half liter of water every couple hours to stay well hydrated.   Sleep: Please be sure to get plenty of sleep with a goal of 8 hours per night. Having a regular bed time and bedtime routine can help with this.  Screens: Reduce the amount of time you are in front of screens.  Take about a 5-10-minute break every hour or every couple hours to give your eyes rest.  Do not use screens in dark rooms.  Glasses with a blue light filter may also help reduce eye fatigue.  Stress: Take steps to reduce stress as much as possible.   Follow up: Follow-up with your primary care provider on this issue.  May also need to follow-up with the neurologist for increased frequency of headaches.

## 2019-05-03 NOTE — ED Triage Notes (Signed)
Pt presents to ED with complaints of migraine x 1 week. Pt +light sensitivity. Pt denies vomiting.

## 2019-05-03 NOTE — ED Provider Notes (Signed)
[facility] EMERGENCY DEPARTMENT Provider Note   CSN: [id number] Arrival date & time: 05/03/19  1349     History Chief Complaint  Patient presents with  . Migraine    [name] is a 44 y.o. female.  HPI      [name] is a 44 y.o. female, with a history of bipolar, migraine, HTN, presenting to the ED with headache intermittent for the last week.  Pain is bilateral frontal, feels like a "pounding," currently 8/10, radiating toward the back of the head.  States this headache is similar to other headaches she has experienced in the past. She takes Topamax for migraine prophylaxis.  It is prescribed twice daily, however, patient takes it once a day every other day because she states it was making her nauseous. Denies illicit drug use.  Occasional alcohol use.  0.3 pack/day smoker. Denies fever/chills, neck stiffness, head injury, confusion, N/V/D, cough, shortness of breath, chest pain, abdominal pain, vision loss, neurologic deficits, or any other complaints.    Past Medical History:  Diagnosis Date  . 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 Anal fissure 03/11/2009  . Anemia   . Anxiety   . Anxiety and depression   . ARTHRITIS 08/31/2007  . Arthritis   . Asthma   . 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 Bipolar 1 disorder ([place])   . Bowel obstruction ([place])   . Breakdown of urinary electronic stimulator device, init ([place])   . BRONCHITIS, RECURRENT 08/23/2009   Asthmatic Bronchitis-Dr. Melvyn Novas.....-HFA 75% 12/04/2008>75% 02/05/2009>75% 08/04/2009 -PFT's 01/04/2009 2.56 (86%) ratio 75, no resp to B2 and DLC0 67% > 80 after correction   . Cancer (HCC)    cervical cancer  . Chronic interstitial cystitis 03/11/2009  . Chronic nausea   . Chronic pain   . Colon polyps   . COLONIC POLYPS, HX OF 07/25/2006   ADENOMATOUS POLYP  . COPD (chronic obstructive pulmonary disease) ([place])   . DEPRESSION 08/31/2007  . Endometriosis   . FIBROMYALGIA 08/31/2007  . Fibromyalgia   . GERD  02/05/2009  . Hyperlipidemia   . HYPERTENSION 08/31/2007  . IBS 03/11/2009  . Internal hemorrhoids   . Migraine headache   . 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 PONV (postoperative nausea and vomiting)   . RECTAL BLEEDING 03/11/2009  . Seizures ([place])    been about 1 year since last seisure per pt  . SLEEP APNEA 08/31/2007  . Substance abuse ([place])   . Thyroid disease   . Uterine cyst     Patient Active Problem List   Diagnosis Date Noted  . Encounter by telehealth for suspected COVID-19 03/20/2019  . Abdominal pain, epigastric 10/07/2018  . Intractable vomiting 08/09/2018  . Compression fracture of fifth lumbar vertebra (HCC) 02/07/2018  . Altered mental status 02/07/2018  . Generalized weakness 01/24/2018  . MDD (major depressive disorder) 04/23/2017  . Substance abuse ([place]) 04/19/2017  . Hyperthyroidism 02/17/2015  . Anxiety 01/28/2015  . Clinical depression 01/28/2015  . COPD with acute exacerbation ([place]) 11/26/2013  . Vaginitis and vulvovaginitis 11/26/2013  . Recurrent pneumonia 02/02/2013  . Left arm pain 01/13/2013  . Left arm pain 01/13/2013    Class: Acute  . Dizziness and giddiness 01/13/2013  . Acute bronchitis 12/07/2012  . Rib pain 12/07/2012  . Insomnia 08/26/2012  . Diarrhea 06/19/2012  . Heme positive stool 06/19/2012  . Bloating 06/19/2012  . Nausea alone 06/19/2012  . Bladder retention 04/24/2012  . Tremor 03/05/2012  . Dysuria 03/05/2012  . Failure to thrive  in adult 06/21/2011  . Airway hyperreactivity 03/08/2011  . Back pain, chronic 03/08/2011  . Chronic obstructive pulmonary disease ([place]) 03/08/2011  . Acid reflux 03/08/2011  . Cystitis 01/22/2011  . Bilateral hand numbness 01/13/2011  . WEIGHT LOSS 06/24/2010  . RIB PAIN, LEFT SIDED 05/04/2010  . ABDOMINAL PAIN, LEFT UPPER QUADRANT 05/04/2010  . Unspecified otitis media 10/26/2009  . BRONCHITIS, RECURRENT 08/23/2009  . URI, ACUTE 08/04/2009  . FATIGUE 07/06/2009  . IBS 03/11/2009  . ANAL  FISSURE 03/11/2009  . RECTAL BLEEDING 03/11/2009  . Chronic interstitial cystitis 03/11/2009  . COLONIC POLYPS, HX OF 03/11/2009  . GERD 02/05/2009  . SMOKER 12/04/2008  . chronic asthma poorly controlled 12/04/2008  . 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 Bipolar disorder ([place]) 08/31/2007  . HYPERTENSION 08/31/2007  . EMPHYSEMA 08/31/2007  . 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 ARTHRITIS 08/31/2007  . FIBROMYALGIA 08/31/2007  . SLEEP APNEA 08/31/2007    Past Surgical History:  Procedure Laterality Date  . ABDOMINAL HYSTERECTOMY    . BIOPSY  10/25/2018   Procedure: BIOPSY;  Surgeon: Rogene Houston, MD;  Location: [facility];  Service: Endoscopy;;  gastric  . bladder stretching x6    . BLADDER SURGERY     stimulator placed and stretching   . CHOLECYSTECTOMY    . COLONOSCOPY    . COLONOSCOPY WITH PROPOFOL N/A 09/03/2014   Procedure: COLONOSCOPY WITH PROPOFOL;  Surgeon: Milus Banister, MD;  Location: [facility];  Service: Endoscopy;  Laterality: N/A;  . ESOPHAGOGASTRODUODENOSCOPY (EGD) WITH PROPOFOL N/A 10/25/2018   Procedure: ESOPHAGOGASTRODUODENOSCOPY (EGD) WITH PROPOFOL;  Surgeon: Rogene Houston, MD;  Location: [facility];  Service: Endoscopy;  Laterality: N/A;  11:15  . interstitial cystitis    . PACEMAKER INSERTION     in hip for interstitial cystitis  . removal of uterine cyst and scrapped uterus    . replaced bladder pacemaker       OB History    Gravida  3   Para  2   Term      Preterm      AB  1   Living  2     SAB  1   TAB  0   Ectopic  0   Multiple  0   Live Births  2           Family History  Problem Relation Age of Onset  . Heart disease Father   . Asthma Maternal Grandmother   . Emphysema Maternal Grandfather   . Cancer Maternal Grandfather        Lung Cancer  . Cancer Other        Lung Cancer-Aunt  . Colon cancer Neg Hx   . Esophageal cancer Neg Hx   . Rectal cancer Neg Hx   .  Stomach cancer Neg Hx   . Thyroid disease Neg Hx     Social History   Tobacco Use  . Smoking status: Current Every Day Smoker    Packs/day: 0.50    Years: 14.00    Pack years: 7.00    Types: Cigarettes  . Smokeless tobacco: Never Used  Substance Use Topics  . Alcohol use: Not Currently    Comment: last drink 3 weeks ago; recently released from rehab  . Drug use: Not Currently    Types: Marijuana    Comment: once a month    Home Medications Prior to Admission medications   Medication Sig Start Date End  Date Taking? Authorizing Provider  acetaminophen-codeine (TYLENOL #4) 300-60 MG tablet Take 1 tablet by mouth every 4 (four) hours as needed for moderate pain. 12/19/18   Drenda Freeze, MD  azithromycin (ZITHROMAX Z-PAK) 250 MG tablet As directed 04/22/19   Carollee Herter, Alferd Apa, DO  budesonide-formoterol (SYMBICORT) 80-4.5 MCG/ACT inhaler Inhale 2 puffs into the lungs 2 (two) times daily.  12/13/17   [provider]  clonazePAM (KLONOPIN) 0.5 MG tablet Take 1 tablet (0.5 mg total) by mouth 3 (three) times daily as needed for anxiety. 05/01/19   [name], DO  FLUoxetine (PROZAC) 40 MG capsule Take 40 mg by mouth daily. 09/23/18   [provider]  fluticasone (FLONASE) 50 MCG/ACT nasal spray Place 2 sprays into both nostrils daily. 04/22/19   [name], DO  gabapentin (NEURONTIN) 300 MG capsule Take 1 capsule (300 mg total) by mouth 2 (two) times daily. For agitation Patient taking differently: Take 300 mg by mouth 3 (three) times daily.  01/25/18   Barton Dubois, MD  hydrOXYzine (ATARAX/VISTARIL) 25 MG tablet Take 25 mg by mouth at bedtime.  08/26/18   [provider]  lamoTRIgine (LAMICTAL) 25 MG tablet Take 25 mg by mouth at bedtime.  12/13/18   [provider]  metroNIDAZOLE (METROGEL VAGINAL) 0.75 % vaginal gel Place 1 Applicatorful vaginally 2 (two) times daily. 04/22/19   [name], DO  mirtazapine (REMERON) 45  MG tablet Take 22.5 mg by mouth at bedtime.  12/13/18   [provider]  oxybutynin (DITROPAN) 5 MG tablet Take 5 mg by mouth every 8 (eight) hours as needed for bladder spasms.    [provider]  pantoprazole (PROTONIX) 40 MG tablet Take 1 tablet (40 mg total) by mouth daily. 09/23/18   Setzer, Rona Ravens, NP  potassium chloride 20 MEQ/15ML (10%) SOLN Take 20 mEq by mouth daily.  11/06/18   [provider]  PROAIR HFA 108 (90 Base) MCG/ACT inhaler INHALE 2 PUFFS EVERY 6 HOURS AS NEEDED FOR SHORTNESS OF BREATH AND WHEEZING. Patient taking differently: Inhale 2 puffs into the lungs every 6 (six) hours as needed for wheezing or shortness of breath.  09/19/18   Roma Schanz R, DO  promethazine (PHENERGAN) 25 MG tablet TAKE (1) TABLET EVERY SIX HOURS AS NEEDED FOR NAUSEA AND VOMITING. 04/22/19   Pieter Partridge, DO  promethazine-dextromethorphan (PROMETHAZINE-DM) 6.25-15 MG/5ML syrup Take 5 mLs by mouth 4 (four) times daily as needed. 04/22/19   [name], DO  QUEtiapine (SEROQUEL) 100 MG tablet Take 400 mg by mouth at bedtime.  09/16/18   [provider]  rizatriptan (MAXALT-MLT) 10 MG disintegrating tablet Take 1 tablet earliest onset of migraine.  May repeat in 2 hours if needed.  Maximum 2 tablets in 24 hours Patient taking differently: Take 10 mg by mouth daily as needed for migraine. Take 1 tablet earliest onset of migraine.  May repeat in 2 hours if needed.  Maximum 2 tablets in 24 hours 09/03/18   Metta Clines R, DO  topiramate (TOPAMAX) 50 MG tablet Take 1 tablet (50 mg total) by mouth 2 (two) times daily. 03/20/19   [name], DO  amitriptyline (ELAVIL) 25 MG tablet 2 tablets by mouth at bedtime   06/21/11  [provider]  clidinium-chlordiazePOXIDE (LIBRAX) 2.5-5 MG per capsule 2 capsules by mouth every morning and 1 at bedtime   06/21/11  [provider]    Allergies  Abilify [aripiprazole], Amitriptyline, Metoclopramide  hcl, Propoxyphene, Tramadol, Ambien [zolpidem tartrate], Eszopiclone, Codeine, Varenicline, Buprenorphine hcl, Demerol [meperidine], Emetrol, and Morphine and related  Review of Systems   Review of Systems  Constitutional: Negative for chills, diaphoresis and fever.  HENT: Negative for congestion, facial swelling, nosebleeds, rhinorrhea, sinus pressure, sore throat, trouble swallowing and voice change.   Eyes: Positive for photophobia. Negative for pain and visual disturbance.  Respiratory: Negative for cough and shortness of breath.   Cardiovascular: Negative for chest pain.  Gastrointestinal: Negative for abdominal pain, diarrhea, nausea and vomiting.  Musculoskeletal: Negative for neck pain and neck stiffness.  Neurological: Positive for headaches. Negative for dizziness, syncope, facial asymmetry, speech difficulty, weakness, light-headedness and numbness.  All other systems reviewed and are negative.   Physical Exam Updated Vital Signs BP 115/82 (BP Location: Right Arm)   Pulse (!) 108   Temp 98.1 F (36.7 C) (Oral)   Resp 18   Ht 5\' 4"  (1.626 m)   Wt 41.3 kg   SpO2 96%   BMI 15.62 kg/m   Physical Exam Vitals and nursing note reviewed.  Constitutional:      General: She is not in acute distress.    Appearance: She is well-developed. She is not diaphoretic.  HENT:     Head: Normocephalic and atraumatic.     Nose: Nose normal.     Right Sinus: No maxillary sinus tenderness or frontal sinus tenderness.     Left Sinus: No maxillary sinus tenderness or frontal sinus tenderness.     Mouth/Throat:     Mouth: Mucous membranes are moist.     Pharynx: Oropharynx is clear.  Eyes:     Conjunctiva/sclera: Conjunctivae normal.  Cardiovascular:     Rate and Rhythm: Normal rate and regular rhythm.     Pulses: Normal pulses.          Radial pulses are 2+ on the right side and 2+ on the left side.       Posterior tibial pulses are 2+ on the right side and 2+ on the left side.      Heart sounds: Normal heart sounds.     Comments: Tactile temperature in the extremities appropriate and equal bilaterally. Not tachycardic on my exam. Pulmonary:     Effort: Pulmonary effort is normal. No respiratory distress.     Breath sounds: Normal breath sounds.  Abdominal:     Palpations: Abdomen is soft.     Tenderness: There is no abdominal tenderness. There is no guarding.  Musculoskeletal:     Cervical back: Neck supple.     Right lower leg: No edema.     Left lower leg: No edema.  Lymphadenopathy:     Cervical: No cervical adenopathy.  Skin:    General: Skin is warm and dry.  Neurological:     Mental Status: She is alert and oriented to person, place, and time.     Comments: No noted acute cognitive deficit. Sensation grossly intact to light touch in the extremities.   Grip strengths equal bilaterally.   Strength 5/5 in all extremities.  No gait disturbance.  Coordination intact.  Cranial nerves III-XII grossly intact.  Handles oral secretions without noted difficulty.  No noted phonation or speech deficit. No facial droop.   Psychiatric:        Mood and Affect: Mood and affect normal.        Speech: Speech normal.        Behavior: Behavior normal.  ED Results / Procedures / Treatments   Labs (all labs ordered are listed, but only abnormal results are displayed) Labs Reviewed  CBC WITH DIFFERENTIAL/PLATELET - Abnormal; Notable for the following components:      Result Value   RBC 3.83 (*)    MCV 100.8 (*)    Platelets 424 (*)    All other components within normal limits  COMPREHENSIVE METABOLIC PANEL - Abnormal; Notable for the following components:   AST 52 (*)    All other components within normal limits  MAGNESIUM    EKG None  Radiology No results found.  Procedures Procedures (including critical care time)  Medications Ordered in ED Medications  sodium chloride 0.9 % bolus 1,000 mL (0 mLs Intravenous Stopped 05/03/19 1554)  dexamethasone  (DECADRON) injection 10 mg (10 mg Intravenous Given 05/03/19 1515)  prochlorperazine (COMPAZINE) injection 10 mg (10 mg Intravenous Given 05/03/19 1514)  diphenhydrAMINE (BENADRYL) injection 50 mg (50 mg Intravenous Given 05/03/19 1517)  ketorolac (TORADOL) 15 MG/ML injection 15 mg (15 mg Intravenous Given 05/03/19 1519)    ED Course  I have reviewed the triage vital signs and the nursing notes.  Pertinent labs & imaging results that were available during my care of the patient were reviewed by me and considered in my medical decision making (see chart for details).  Clinical Course as of May 03 1555  Sat May 03, 2019  1550 Patient states she feels much better and would like to leave.   [SJ]    Clinical Course User Index [SJ] Patt Steinhardt, Helane Gunther, PA-C   MDM Rules/Calculators/A&P                      Patient presents with headache similar to previous instances.  No focal neuro deficits.  No red flag symptoms. Improvement during ED course. The patient was given instructions for home care as well as return precautions. Patient voices understanding of these instructions, accepts the plan, and is comfortable with discharge.    Final Clinical Impression(s) / ED Diagnoses Final diagnoses:  Frontal headache    Rx / DC Orders ED Discharge Orders    None       Layla Maw 05/03/19 [place], Julie, MD 05/04/19 [phone]

## 2019-05-27 ENCOUNTER — Other Ambulatory Visit: Payer: Self-pay | Admitting: Neurology

## 2019-05-28 DIAGNOSIS — N760 Acute vaginitis: Secondary | ICD-10-CM | POA: Diagnosis not present

## 2019-05-28 DIAGNOSIS — R309 Painful micturition, unspecified: Secondary | ICD-10-CM | POA: Diagnosis not present

## 2019-06-09 DIAGNOSIS — N3010 Interstitial cystitis (chronic) without hematuria: Secondary | ICD-10-CM | POA: Diagnosis not present

## 2019-06-13 DIAGNOSIS — Z79899 Other long term (current) drug therapy: Secondary | ICD-10-CM | POA: Diagnosis not present

## 2019-06-13 DIAGNOSIS — F1011 Alcohol abuse, in remission: Secondary | ICD-10-CM | POA: Diagnosis not present

## 2019-06-13 DIAGNOSIS — F411 Generalized anxiety disorder: Secondary | ICD-10-CM | POA: Diagnosis not present

## 2019-06-13 DIAGNOSIS — F319 Bipolar disorder, unspecified: Secondary | ICD-10-CM | POA: Diagnosis not present

## 2019-07-02 ENCOUNTER — Encounter: Payer: Self-pay | Admitting: Family Medicine

## 2019-07-05 ENCOUNTER — Other Ambulatory Visit: Payer: Self-pay | Admitting: Family Medicine

## 2019-07-05 DIAGNOSIS — K219 Gastro-esophageal reflux disease without esophagitis: Secondary | ICD-10-CM

## 2019-07-09 ENCOUNTER — Other Ambulatory Visit: Payer: Self-pay

## 2019-07-09 ENCOUNTER — Ambulatory Visit (INDEPENDENT_AMBULATORY_CARE_PROVIDER_SITE_OTHER): Payer: BC Managed Care – PPO | Admitting: Family Medicine

## 2019-07-09 ENCOUNTER — Other Ambulatory Visit (HOSPITAL_COMMUNITY)
Admission: RE | Admit: 2019-07-09 | Discharge: 2019-07-09 | Disposition: A | Discharge status: Home / Self Care | Payer: BC Managed Care – PPO | Source: Ambulatory Visit | Attending: Family Medicine | Admitting: Family Medicine

## 2019-07-09 ENCOUNTER — Encounter: Payer: Self-pay | Admitting: Family Medicine

## 2019-07-09 DIAGNOSIS — R300 Dysuria: Secondary | ICD-10-CM | POA: Diagnosis not present

## 2019-07-09 DIAGNOSIS — R350 Frequency of micturition: Secondary | ICD-10-CM | POA: Insufficient documentation

## 2019-07-09 LAB — POC URINALSYSI DIPSTICK (AUTOMATED)
Bilirubin, UA: NEGATIVE
Blood, UA: 80
Glucose, UA: NEGATIVE
Ketones, UA: 4
Nitrite, UA: NEGATIVE
Protein, UA: POSITIVE — AB
Spec Grav, UA: 1.025 (ref 1.010–1.025)
Urobilinogen, UA: 0.2 E.U./dL
pH, UA: 6 (ref 5.0–8.0)

## 2019-07-09 MED ORDER — PHENAZOPYRIDINE HCL 200 MG PO TABS: 200.0000 mg | ORAL_TABLET | Freq: Three times a day (TID) | ORAL | 0 refills | Status: DC | PRN

## 2019-07-09 MED ORDER — CEPHALEXIN 500 MG PO CAPS: 500.0000 mg | ORAL_CAPSULE | Freq: Two times a day (BID) | ORAL | 0 refills | Status: DC

## 2019-07-09 NOTE — Addendum Note (Signed)
Addended by: Caffie Pinto on: 07/09/2019 11:10 AM   Modules accepted: Orders

## 2019-07-09 NOTE — Progress Notes (Signed)
Virtual Visit via Video Note  I connected with [name] on 07/09/19 at  9:40 AM EDT by a video enabled telemedicine application and verified that I am speaking with the correct person using two identifiers.  Location: Patient: home alone  Provider: home    I discussed the limitations of evaluation and management by telemedicine and the availability of in person appointments. The patient expressed understanding and agreed to proceed.  History of Present Illness: Pt is home c/o dyuria and frequency x few day.   She has been unable to get relief No fever  No abd pain, no d/c  Observations/Objective: There were no vitals filed for this visit. Pt is uncomfortable--- jumping around room because of the burning   Assessment and Plan: 1. Urinary frequency Suspect uti  abx per orders Pt going to office for lab  - POCT Urinalysis Dipstick (Automated) - Urine Culture - Urine cytology ancillary only([place]) - cephALEXin (KEFLEX) 500 MG capsule; Take 1 capsule (500 mg total) by mouth 2 (two) times daily.  Dispense: 14 capsule; Refill: 0 - phenazopyridine (PYRIDIUM) 200 MG tablet; Take 1 tablet (200 mg total) by mouth 3 (three) times daily as needed for pain.  Dispense: 6 tablet; Refill: 0  2. Dysuria See above  - POCT Urinalysis Dipstick (Automated) - Urine Culture - Urine cytology ancillary only() - cephALEXin (KEFLEX) 500 MG capsule; Take 1 capsule (500 mg total) by mouth 2 (two) times daily.  Dispense: 14 capsule; Refill: 0 - phenazopyridine (PYRIDIUM) 200 MG tablet; Take 1 tablet (200 mg total) by mouth 3 (three) times daily as needed for pain.  Dispense: 6 tablet; Refill: 0   Follow Up Instructions:    I discussed the assessment and treatment plan with the patient. The patient was provided an opportunity to ask questions and all were answered. The patient agreed with the plan and demonstrated an understanding of the instructions.   The patient was advised to call  back or seek an in-person evaluation if the symptoms worsen or if the condition fails to improve as anticipated.  I provided 25 minutes of non-face-to-face time during this encounter.   [name], DO

## 2019-07-10 LAB — URINE CULTURE
MICRO NUMBER:: 10312586
SPECIMEN QUALITY:: ADEQUATE

## 2019-07-11 DIAGNOSIS — Z20822 Contact with and (suspected) exposure to covid-19: Secondary | ICD-10-CM | POA: Diagnosis not present

## 2019-07-11 DIAGNOSIS — Z20828 Contact with and (suspected) exposure to other viral communicable diseases: Secondary | ICD-10-CM | POA: Diagnosis not present

## 2019-07-14 ENCOUNTER — Ambulatory Visit: Payer: BC Managed Care – PPO | Admitting: Family Medicine

## 2019-07-14 DIAGNOSIS — Z0289 Encounter for other administrative examinations: Secondary | ICD-10-CM

## 2019-07-14 LAB — URINE CYTOLOGY ANCILLARY ONLY
Bacterial Vaginitis-Urine: NEGATIVE
Candida Urine: NEGATIVE
Chlamydia: NEGATIVE
Comment: NEGATIVE
Comment: NEGATIVE
Comment: NORMAL
Neisseria Gonorrhea: NEGATIVE
Trichomonas: NEGATIVE

## 2019-07-18 DIAGNOSIS — F17210 Nicotine dependence, cigarettes, uncomplicated: Secondary | ICD-10-CM | POA: Diagnosis not present

## 2019-07-18 DIAGNOSIS — N3010 Interstitial cystitis (chronic) without hematuria: Secondary | ICD-10-CM | POA: Diagnosis not present

## 2019-07-18 DIAGNOSIS — K219 Gastro-esophageal reflux disease without esophagitis: Secondary | ICD-10-CM | POA: Diagnosis not present

## 2019-07-18 DIAGNOSIS — J449 Chronic obstructive pulmonary disease, unspecified: Secondary | ICD-10-CM | POA: Diagnosis not present

## 2019-07-18 DIAGNOSIS — Z7951 Long term (current) use of inhaled steroids: Secondary | ICD-10-CM | POA: Diagnosis not present

## 2019-07-18 DIAGNOSIS — M797 Fibromyalgia: Secondary | ICD-10-CM | POA: Diagnosis not present

## 2019-07-21 ENCOUNTER — Encounter: Payer: BC Managed Care – PPO | Admitting: Family Medicine

## 2019-07-21 ENCOUNTER — Other Ambulatory Visit: Payer: Self-pay

## 2019-07-22 ENCOUNTER — Encounter: Payer: Self-pay | Admitting: Family Medicine

## 2019-07-22 ENCOUNTER — Other Ambulatory Visit (HOSPITAL_COMMUNITY)
Admission: RE | Admit: 2019-07-22 | Discharge: 2019-07-22 | Disposition: A | Discharge status: Home / Self Care | Payer: BC Managed Care – PPO | Source: Ambulatory Visit | Attending: Family Medicine | Admitting: Family Medicine

## 2019-07-22 ENCOUNTER — Ambulatory Visit: Payer: BC Managed Care – PPO | Admitting: Family Medicine

## 2019-07-22 VITALS — BP 98/60 | HR 64 | Temp 98.0°F | Resp 18 | Ht 64.0 in | Wt 102.6 lb

## 2019-07-22 DIAGNOSIS — N390 Urinary tract infection, site not specified: Secondary | ICD-10-CM

## 2019-07-22 DIAGNOSIS — R413 Other amnesia: Secondary | ICD-10-CM | POA: Diagnosis not present

## 2019-07-22 DIAGNOSIS — N760 Acute vaginitis: Secondary | ICD-10-CM | POA: Diagnosis not present

## 2019-07-22 LAB — POC URINALSYSI DIPSTICK (AUTOMATED)
Bilirubin, UA: NEGATIVE
Blood, UA: NEGATIVE
Glucose, UA: NEGATIVE
Ketones, UA: NEGATIVE
Leukocytes, UA: NEGATIVE
Nitrite, UA: NEGATIVE
Protein, UA: NEGATIVE
Spec Grav, UA: 1.01 (ref 1.010–1.025)
Urobilinogen, UA: 0.2 E.U./dL
pH, UA: 6 (ref 5.0–8.0)

## 2019-07-22 NOTE — Progress Notes (Signed)
Patient ID: [name], female    DOB: Nov 28, 1975  Age: 44 y.o. MRN: [id number]    Subjective:  Subjective  HPI [name] presents for memory loss--- it has been a problem for a few years but has worsened over the last month.   Family members are calling her out She has episodes where she blacks out and cant remember anything    Review of Systems  Constitutional: Negative for appetite change, diaphoresis, fatigue and unexpected weight change.  Eyes: Negative for pain, redness and visual disturbance.  Respiratory: Negative for cough, chest tightness, shortness of breath and wheezing.   Cardiovascular: Negative for chest pain, palpitations and leg swelling.  Endocrine: Negative for cold intolerance, heat intolerance, polydipsia, polyphagia and polyuria.  Genitourinary: Positive for dysuria, frequency and vaginal discharge. Negative for decreased urine volume, difficulty urinating, pelvic pain, urgency, vaginal bleeding and vaginal pain.  Neurological: Negative for dizziness, light-headedness, numbness and headaches.  Psychiatric/Behavioral: Positive for confusion and decreased concentration.       Memory loss    History Past Medical History:  Diagnosis Date  . 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 Anal fissure 03/11/2009  . Anemia   . Anxiety   . Anxiety and depression   . ARTHRITIS 08/31/2007  . Arthritis   . Asthma   . 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 Bipolar 1 disorder ([place])   . Bowel obstruction ([place])   . Breakdown of urinary electronic stimulator device, init ([place])   . BRONCHITIS, RECURRENT 08/23/2009   Asthmatic Bronchitis-Dr. Melvyn Novas.....-HFA 75% 12/04/2008>75% 02/05/2009>75% 08/04/2009 -PFT's 01/04/2009 2.56 (86%) ratio 75, no resp to B2 and DLC0 67% > 80 after correction   . Cancer (HCC)    cervical cancer  . Chronic interstitial cystitis 03/11/2009  . Chronic nausea   . Chronic pain   . Colon polyps   . COLONIC POLYPS, HX OF 07/25/2006   ADENOMATOUS POLYP  .  COPD (chronic obstructive pulmonary disease) ([place])   . DEPRESSION 08/31/2007  . Endometriosis   . FIBROMYALGIA 08/31/2007  . Fibromyalgia   . GERD 02/05/2009  . Hyperlipidemia   . HYPERTENSION 08/31/2007  . IBS 03/11/2009  . Internal hemorrhoids   . Migraine headache   . 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 PONV (postoperative nausea and vomiting)   . RECTAL BLEEDING 03/11/2009  . Seizures ([place])    been about 1 year since last seisure per pt  . SLEEP APNEA 08/31/2007  . Substance abuse ([place])   . Thyroid disease   . Uterine cyst     She has a past surgical history that includes Cholecystectomy; Abdominal hysterectomy; Pacemaker insertion; Bladder surgery; interstitial cystitis; bladder stretching x6; replaced bladder pacemaker; removal of uterine cyst and scrapped uterus; Colonoscopy; Colonoscopy with propofol (N/A, 09/03/2014); Esophagogastroduodenoscopy (egd) with propofol (N/A, 10/25/2018); and biopsy (10/25/2018).   Her family history includes Asthma in her maternal grandmother; Cancer in her maternal grandfather and another family member; Emphysema in her maternal grandfather; Heart disease in her father.She reports that she has been smoking cigarettes. She has a 7.00 pack-year smoking history. She has never used smokeless tobacco. She reports previous alcohol use. She reports previous drug use. Drug: Marijuana.  Current Outpatient Medications on File Prior to Visit  Medication Sig Dispense Refill  . acetaminophen-codeine (TYLENOL #4) 300-60 MG tablet Take 1 tablet by mouth every 4 (four) hours as needed for moderate pain. 10 tablet 0  . budesonide-formoterol (SYMBICORT) 80-4.5 MCG/ACT inhaler Inhale 2 puffs into the lungs 2 (two) times daily.     Marland Kitchen  cephALEXin (KEFLEX) 500 MG capsule Take 1 capsule (500 mg total) by mouth 2 (two) times daily. 14 capsule 0  . clonazePAM (KLONOPIN) 0.5 MG tablet Take 1 tablet (0.5 mg total) by mouth 3 (three) times daily as needed for anxiety. 90 tablet 1  .  FLUoxetine (PROZAC) 40 MG capsule Take 40 mg by mouth daily.    . fluticasone (FLONASE) 50 MCG/ACT nasal spray Place 2 sprays into both nostrils daily. 16 g 6  . gabapentin (NEURONTIN) 300 MG capsule Take 1 capsule (300 mg total) by mouth 2 (two) times daily. For agitation (Patient taking differently: Take 300 mg by mouth 3 (three) times daily. )    . hydrOXYzine (ATARAX/VISTARIL) 25 MG tablet Take 25 mg by mouth at bedtime.     . lamoTRIgine (LAMICTAL) 25 MG tablet Take 25 mg by mouth at bedtime.     . metroNIDAZOLE (METROGEL VAGINAL) 0.75 % vaginal gel Place 1 Applicatorful vaginally 2 (two) times daily. 70 g 0  . mirtazapine (REMERON) 45 MG tablet Take 22.5 mg by mouth at bedtime.     Marland Kitchen omeprazole (PRILOSEC) 40 MG capsule TAKE 1 CAPSULE BY MOUTH ONCE DAILY. 30 capsule 0  . oxybutynin (DITROPAN) 5 MG tablet Take 5 mg by mouth every 8 (eight) hours as needed for bladder spasms.    . pantoprazole (PROTONIX) 40 MG tablet Take 1 tablet (40 mg total) by mouth daily. 90 tablet 3  . phenazopyridine (PYRIDIUM) 200 MG tablet Take 1 tablet (200 mg total) by mouth 3 (three) times daily as needed for pain. 6 tablet 0  . potassium chloride 20 MEQ/15ML (10%) SOLN Take 20 mEq by mouth daily.     Marland Kitchen PROAIR HFA 108 (90 Base) MCG/ACT inhaler INHALE 2 PUFFS EVERY 6 HOURS AS NEEDED FOR SHORTNESS OF BREATH AND WHEEZING. (Patient taking differently: Inhale 2 puffs into the lungs every 6 (six) hours as needed for wheezing or shortness of breath. ) 8.5 g 1  . promethazine (PHENERGAN) 25 MG tablet TAKE (1) TABLET EVERY SIX HOURS AS NEEDED FOR NAUSEA AND VOMITING. 20 tablet 2  . promethazine-dextromethorphan (PROMETHAZINE-DM) 6.25-15 MG/5ML syrup Take 5 mLs by mouth 4 (four) times daily as needed. 118 mL 0  . QUEtiapine (SEROQUEL) 100 MG tablet Take 400 mg by mouth at bedtime.     . rizatriptan (MAXALT-MLT) 10 MG disintegrating tablet Take 1 tablet earliest onset of migraine.  May repeat in 2 hours if needed.  Maximum 2  tablets in 24 hours (Patient taking differently: Take 10 mg by mouth daily as needed for migraine. Take 1 tablet earliest onset of migraine.  May repeat in 2 hours if needed.  Maximum 2 tablets in 24 hours) 9 tablet 3  . topiramate (TOPAMAX) 50 MG tablet Take 1 tablet (50 mg total) by mouth 2 (two) times daily. 60 tablet 5  . [DISCONTINUED] amitriptyline (ELAVIL) 25 MG tablet 2 tablets by mouth at bedtime     . [DISCONTINUED] clidinium-chlordiazePOXIDE (LIBRAX) 2.5-5 MG per capsule 2 capsules by mouth every morning and 1 at bedtime      No current facility-administered medications on file prior to visit.     Objective:  Objective  Physical Exam Vitals and nursing note reviewed.  Constitutional:      Appearance: She is well-developed.  HENT:     Head: Normocephalic and atraumatic.  Eyes:     Conjunctiva/sclera: Conjunctivae normal.  Neck:     Thyroid: No thyromegaly.     Vascular: No carotid bruit  or JVD.  Cardiovascular:     Rate and Rhythm: Normal rate and regular rhythm.     Heart sounds: Normal heart sounds. No murmur.  Pulmonary:     Effort: Pulmonary effort is normal. No respiratory distress.     Breath sounds: Normal breath sounds. No wheezing or rales.  Chest:     Chest wall: No tenderness.  Abdominal:     Tenderness: There is abdominal tenderness in the suprapubic area.  Musculoskeletal:     Cervical back: Normal range of motion and neck supple.  Neurological:     Mental Status: She is alert. She is disoriented.     Comments: mmse 16/30  Psychiatric:        Cognition and Memory: Memory is impaired. She exhibits impaired recent memory.     Comments: mmse 16/30    BP 98/60 (BP Location: Right Arm, Patient Position: Sitting, Cuff Size: Normal)   Pulse 64   Temp 98 F (36.7 C) (Temporal)   Resp 18   Ht 5\' 4"  (1.626 m)   Wt 102 lb 9.6 oz (46.5 kg)   SpO2 98%   BMI 17.61 kg/m  Wt Readings from Last 3 Encounters:  07/22/19 102 lb 9.6 oz (46.5 kg)  05/03/19 91 lb  (41.3 kg)  04/22/19 91 lb (41.3 kg)     Lab Results  Component Value Date   WBC 8.8 05/03/2019   HGB 12.3 05/03/2019   HCT 38.6 05/03/2019   PLT 424 (H) 05/03/2019   GLUCOSE 79 05/03/2019   CHOL 143 04/24/2017   TRIG 85 04/24/2017   HDL 49 04/24/2017   LDLCALC 77 04/24/2017   ALT 39 05/03/2019   AST 52 (H) 05/03/2019   NA 142 05/03/2019   K 3.9 05/03/2019   CL 108 05/03/2019   CREATININE 0.70 05/03/2019   BUN 16 05/03/2019   CO2 26 05/03/2019   TSH 1.453 09/10/2018   INR 0.9 12/19/2018   HGBA1C 5.0 04/24/2017    No results found.   Assessment & Plan:  Plan  I am having Robert R. Bartnick maintain her budesonide-formoterol, gabapentin, hydrOXYzine, rizatriptan, ProAir HFA, pantoprazole, FLUoxetine, QUEtiapine, oxybutynin, potassium chloride, lamoTRIgine, mirtazapine, acetaminophen-codeine, topiramate, metroNIDAZOLE, fluticasone, promethazine-dextromethorphan, clonazePAM, promethazine, omeprazole, cephALEXin, and phenazopyridine.  No orders of the defined types were placed in this encounter.   Problem List Items Addressed This Visit    None    Visit Diagnoses    Memory loss    -  Primary   Relevant Orders   Ambulatory referral to Neurology   Ambulatory referral to Neuropsychology   CBC with Differential/Platelet   Comprehensive metabolic panel   TSH   Sedimentation rate   RPR   CT HEAD W & WO CONTRAST   Urinary tract infection without hematuria, site unspecified       Relevant Orders   POCT Urinalysis Dipstick (Automated) (Completed)   Acute vaginitis       Relevant Orders   Urine cytology ancillary only([place])   HIV antibody (with reflex)      Follow-up: Return if symptoms worsen or fail to improve.  [name], DO

## 2019-07-22 NOTE — Patient Instructions (Signed)
Dementia Dementia is a condition that affects the way the brain functions. It often affects memory and thinking. Usually, dementia gets worse with time and cannot be reversed (progressive dementia). There are many types of dementia, including:  Alzheimer's disease. This type is the most common.  Vascular dementia. This type may happen as the result of a stroke.  Lewy body dementia. This type may happen to people who have Parkinson's disease.  Frontotemporal dementia. This type is caused by damage to nerve cells (neurons) in certain parts of the brain. Some people may be affected by more than one type of dementia. This is called mixed dementia. What are the causes? Dementia is caused by damage to cells in the brain. The area of the brain and the types of cells damaged determine the type of dementia. Usually, this damage is irreversible or cannot be undone. Some examples of irreversible causes include:  Conditions that affect the blood vessels of the brain, such as diabetes, heart disease, or blood vessel disease.  Genetic mutations. In some cases, changes in the brain may be caused by another condition and can be reversed or slowed. Some examples of reversible causes include:  Injury to the brain.  Certain medicines.  Infection, such as meningitis.  Metabolic problems, such as vitamin B12 deficiency or thyroid disease.  Pressure on the brain, such as from a tumor or blood clot. What are the signs or symptoms? Symptoms of dementia depend on the type of dementia. Common signs of dementia include problems with remembering, thinking, problem solving, decision making, and communicating. These signs develop slowly or get worse with time. This may include:  Problems remembering things.  Having trouble taking a bath or putting clothes on.  Forgetting appointments.  Forgetting to pay bills.  Difficulty planning and preparing meals.  Having trouble speaking.  Getting lost easily. How  is this diagnosed? This condition is diagnosed by a specialist (neurologist). It is diagnosed based on the history of your symptoms, your medical history, a physical exam, and tests. Tests may include:  Tests to evaluate brain function, such as memory tests, cognitive tests, and other tests.  Lab tests, such as blood or urine tests.  Imaging tests, such as a CT scan, a PET scan, or an MRI.  Genetic testing. This may be done if other family members have a diagnosis of certain types of dementia. Your health care provider will talk with you and your family, friends, or caregivers about your history and symptoms. How is this treated?  Treatment for this condition depends on the cause of the dementia. Progressive dementias, such as Alzheimer's disease, cannot be cured, but there may be treatments that help to manage symptoms. Treatment might involve taking medicines that may help to:  Control the dementia.  Slow down the progression of the dementia.  Manage symptoms. In some cases, treating the cause of your dementia can improve symptoms, reverse symptoms, or slow down how quickly your dementia becomes worse. Your health care provider can direct you to support groups, organizations, and other health care providers who can help with decisions about your care. Follow these instructions at home: Medicines  Take over-the-counter and prescription medicines only as told by your health care provider.  Use a pill organizer or pill reminder to help you manage your medicines.  Avoid taking medicines that can affect thinking, such as pain medicines or sleeping medicines. Lifestyle  Make healthy lifestyle choices. ? Be physically active as told by your health care provider. ? Do   not use any products that contain nicotine or tobacco, such as cigarettes, e-cigarettes, and chewing tobacco. If you need help quitting, ask your health care provider. ? Do not drink alcohol. ? Practice stress-management  techniques when you get stressed. ? Spend time with other people.  Make sure to get quality sleep. These tips can help you get a good night's rest: ? Avoid napping during the day. ? Keep your sleeping area dark and cool. ? Avoid exercising during the few hours before you go to bed. ? Avoid caffeine products in the evening. Eating and drinking  Drink enough fluid to keep your urine pale yellow.  Eat a healthy diet. General instructions   Work with your health care provider to determine what you need help with and what your safety needs are.  Talk with your health care provider about whether it is safe for you to drive.  If you were given a bracelet that identifies you as a person with memory loss or tracks your location, make sure to wear it at all times.  Work with your family to make important decisions, such as advance directives, medical power of attorney, or a living will.  Keep all follow-up visits as told by your health care provider. This is important. Where to find more information  Alzheimer's Association: www.alz.org  National Institute on Aging: www.nia.nih.gov/alzheimers  World Health Organization: www.who.int Contact a health care provider if:  You have any new or worsening symptoms.  You have problems with choking or swallowing. Get help right away if:  You feel depressed or sad, or feel that you want to harm yourself.  Your family members become concerned for your safety. If you ever feel like you may hurt yourself or others, or have thoughts about taking your own life, get help right away. You can go to your nearest emergency department or call:  Your local emergency services (911 in the [place]  A suicide crisis helpline, such as the National Suicide Prevention Lifeline at 1-800-273-8255. This is open 24 hours a day. Summary  Dementia is a condition that affects the way the brain functions. Dementia often affects memory and thinking.  Usually,  dementia gets worse with time and cannot be reversed (progressive dementia).  Treatment for this condition depends on the cause of the dementia.  Work with your health care provider to determine what you need help with and what your safety needs are.  Your health care provider can direct you to support groups, organizations, and other health care providers who can help with decisions about your care. This information is not intended to replace advice given to you by your health care provider. Make sure you discuss any questions you have with your health care provider. Document Revised: 06/11/2018 Document Reviewed: 06/11/2018 Elsevier Patient Education  2020 [employer]  

## 2019-07-23 ENCOUNTER — Encounter: Payer: Self-pay | Admitting: Counselor

## 2019-07-23 ENCOUNTER — Other Ambulatory Visit: Payer: Self-pay | Admitting: Family Medicine

## 2019-07-23 LAB — CBC WITH DIFFERENTIAL/PLATELET
Basophils Absolute: 0.1 10*3/uL (ref 0.0–0.1)
Basophils Relative: 1.2 % (ref 0.0–3.0)
Eosinophils Absolute: 0.4 10*3/uL (ref 0.0–0.7)
Eosinophils Relative: 4.2 % (ref 0.0–5.0)
HCT: 37.6 % (ref 36.0–46.0)
Hemoglobin: 12.2 g/dL (ref 12.0–15.0)
Lymphocytes Relative: 44.7 % (ref 12.0–46.0)
Lymphs Abs: 3.8 10*3/uL (ref 0.7–4.0)
MCHC: 32.4 g/dL (ref 30.0–36.0)
MCV: 97.5 fl (ref 78.0–100.0)
Monocytes Absolute: 0.4 10*3/uL (ref 0.1–1.0)
Monocytes Relative: 5 % (ref 3.0–12.0)
Neutro Abs: 3.8 10*3/uL (ref 1.4–7.7)
Neutrophils Relative %: 44.9 % (ref 43.0–77.0)
Platelets: 372 10*3/uL (ref 150.0–400.0)
RBC: 3.85 Mil/uL — ABNORMAL LOW (ref 3.87–5.11)
RDW: 13 % (ref 11.5–15.5)
WBC: 8.6 10*3/uL (ref 4.0–10.5)

## 2019-07-23 LAB — COMPREHENSIVE METABOLIC PANEL
ALT: 9 U/L (ref 0–35)
AST: 14 U/L (ref 0–37)
Albumin: 4.1 g/dL (ref 3.5–5.2)
Alkaline Phosphatase: 104 U/L (ref 39–117)
BUN: 7 mg/dL (ref 6–23)
CO2: 28 mEq/L (ref 19–32)
Calcium: 9.2 mg/dL (ref 8.4–10.5)
Chloride: 105 mEq/L (ref 96–112)
Creatinine, Ser: 0.78 mg/dL (ref 0.40–1.20)
GFR: 80.51 mL/min (ref >60.00)
Glucose, Bld: 71 mg/dL (ref 70–99)
Potassium: 4.1 mEq/L (ref 3.5–5.1)
Sodium: 139 mEq/L (ref 135–145)
Total Bilirubin: 0.3 mg/dL (ref 0.2–1.2)
Total Protein: 6.6 g/dL (ref 6.0–8.3)

## 2019-07-23 LAB — TSH: TSH: 1.03 u[IU]/mL (ref 0.35–4.50)

## 2019-07-23 LAB — SEDIMENTATION RATE: Sed Rate: 15 mm/hr (ref 0–20)

## 2019-07-23 LAB — RPR: RPR Ser Ql: NONREACTIVE

## 2019-07-23 LAB — HIV ANTIBODY (ROUTINE TESTING W REFLEX): HIV 1&2 Ab, 4th Generation: NONREACTIVE

## 2019-07-24 ENCOUNTER — Ambulatory Visit (HOSPITAL_BASED_OUTPATIENT_CLINIC_OR_DEPARTMENT_OTHER): Admission: RE | Admit: 2019-07-24 | Payer: BC Managed Care – PPO | Source: Ambulatory Visit

## 2019-07-24 LAB — URINE CYTOLOGY ANCILLARY ONLY
Bacterial vaginitis: NEGATIVE
Candida vaginitis: POSITIVE — AB

## 2019-07-25 ENCOUNTER — Other Ambulatory Visit: Payer: Self-pay | Admitting: Family Medicine

## 2019-07-25 DIAGNOSIS — N760 Acute vaginitis: Secondary | ICD-10-CM

## 2019-07-25 MED ORDER — FLUCONAZOLE 150 MG PO TABS: ORAL_TABLET | ORAL | 0 refills | Status: DC

## 2019-07-28 LAB — URINE CYTOLOGY ANCILLARY ONLY
Bacterial Vaginitis-Urine: NEGATIVE
Candida Urine: NEGATIVE
Chlamydia: NEGATIVE
Comment: NEGATIVE
Comment: NEGATIVE
Comment: NORMAL
Neisseria Gonorrhea: NEGATIVE
Trichomonas: NEGATIVE

## 2019-08-06 NOTE — Progress Notes (Signed)
This encounter was created in error - please disregard.

## 2019-08-19 ENCOUNTER — Telehealth (INDEPENDENT_AMBULATORY_CARE_PROVIDER_SITE_OTHER): Payer: BC Managed Care – PPO | Admitting: Internal Medicine

## 2019-08-19 ENCOUNTER — Encounter: Payer: Self-pay | Admitting: Internal Medicine

## 2019-08-19 VITALS — Ht 64.0 in

## 2019-08-19 DIAGNOSIS — R634 Abnormal weight loss: Secondary | ICD-10-CM | POA: Diagnosis not present

## 2019-08-19 DIAGNOSIS — R110 Nausea: Secondary | ICD-10-CM | POA: Diagnosis not present

## 2019-08-19 NOTE — Progress Notes (Signed)
Subjective:    Patient ID: [name], female    DOB: 02-Aug-1975, 43 y.o.   MRN: [id number]  DOS:  08/19/2019 Type of visit - description: Virtual Visit via Video Note  I connected with the above patient  by a video enabled telemedicine application and verified that I am speaking with the correct person using two identifiers.   THIS ENCOUNTER IS A VIRTUAL VISIT DUE TO COVID-19 - PATIENT WAS NOT SEEN IN THE OFFICE. PATIENT HAS CONSENTED TO VIRTUAL VISIT / TELEMEDICINE VISIT   Location of patient: home  Location of provider: office  I discussed the limitations of evaluation and management by telemedicine and the availability of in person appointments. The patient expressed understanding and agreed to proceed.  Acute Symptoms started approximately 2 to 3 weeks ago. She is having persistent postprandial nausea, 15 minutes after she tries to eat something she gets really nauseous and vomits. No hematemesis.  She admits to 10 or 15 pounds weight loss. No diarrhea, no blood in the stools. She actually denies abdominal pain, dysphagia or odynophagia. Not taking any new medications, not taking NSAIDs. Good compliance with PPIs. Admits to being somewhat dizziness when she stands up.  Denies fever chills She has chronic headaches, slightly worse lately?Marland Kitchen She has been taking a number of antibiotics lately mostly due to to urinary issues.   Review of Systems See above   Past Medical History:  Diagnosis Date  . 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 Anal fissure 03/11/2009  . Anemia   . Anxiety   . Anxiety and depression   . ARTHRITIS 08/31/2007  . Arthritis   . Asthma   . 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 Bipolar 1 disorder ([place])   . Bowel obstruction ([place])   . Breakdown of urinary electronic stimulator device, init ([place])   . BRONCHITIS, RECURRENT 08/23/2009   Asthmatic Bronchitis-Dr. Melvyn Novas.....-HFA 75% 12/04/2008>75% 02/05/2009>75% 08/04/2009 -PFT's 01/04/2009 2.56 (86%)  ratio 75, no resp to B2 and DLC0 67% > 80 after correction   . Cancer (HCC)    cervical cancer  . Chronic interstitial cystitis 03/11/2009  . Chronic nausea   . Chronic pain   . Colon polyps   . COLONIC POLYPS, HX OF 07/25/2006   ADENOMATOUS POLYP  . COPD (chronic obstructive pulmonary disease) ([place])   . DEPRESSION 08/31/2007  . Endometriosis   . FIBROMYALGIA 08/31/2007  . Fibromyalgia   . GERD 02/05/2009  . Hyperlipidemia   . HYPERTENSION 08/31/2007  . IBS 03/11/2009  . Internal hemorrhoids   . Migraine headache   . 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 PONV (postoperative nausea and vomiting)   . RECTAL BLEEDING 03/11/2009  . Seizures ([place])    been about 1 year since last seisure per pt  . SLEEP APNEA 08/31/2007  . Substance abuse ([place])   . Thyroid disease   . Uterine cyst     Past Surgical History:  Procedure Laterality Date  . ABDOMINAL HYSTERECTOMY    . BIOPSY  10/25/2018   Procedure: BIOPSY;  Surgeon: Rogene Houston, MD;  [facility];  Service: Endoscopy;;  gastric  . bladder stretching x6    . BLADDER SURGERY     stimulator placed and stretching   . CHOLECYSTECTOMY    . COLONOSCOPY    . COLONOSCOPY WITH PROPOFOL N/A 09/03/2014   Procedure: COLONOSCOPY WITH PROPOFOL;  Surgeon: Milus Banister, MD;  [facility];  Service: Endoscopy;  Laterality: N/A;  . ESOPHAGOGASTRODUODENOSCOPY (EGD) WITH PROPOFOL N/A 10/25/2018  Procedure: ESOPHAGOGASTRODUODENOSCOPY (EGD) WITH PROPOFOL;  Surgeon: Rogene Houston, MD;  [facility];  Service: Endoscopy;  Laterality: N/A;  11:15  . interstitial cystitis    . PACEMAKER INSERTION     in hip for interstitial cystitis  . removal of uterine cyst and scrapped uterus    . replaced bladder pacemaker      Allergies as of 08/19/2019      Reactions   Abilify [aripiprazole] Swelling, Palpitations, Other (See Comments)   Throat swelling, tremors   Amitriptyline Anaphylaxis   Angioedema   Metoclopramide Hcl Other  (See Comments)   Causes seizures   Propoxyphene Rash   Tramadol Swelling, Other (See Comments), Rash   Throat swelling, tremors   Ambien [zolpidem Tartrate] Other (See Comments)   hallucinations   Eszopiclone Other (See Comments)   Hallucinations, hyperactivity, and bad taste in mouth   Codeine Other (See Comments)   Tylenol #3 caused itching   Varenicline Other (See Comments)   Suicidal thoughts   Buprenorphine Hcl Itching, Hives   Demerol [meperidine] Rash   Emetrol Hives, Itching, Rash   Morphine And Related Hives, Itching      Medication List       Accurate as of Aug 19, 2019 11:59 PM. If you have any questions, ask your nurse or doctor.        STOP taking these medications   amitriptyline 25 MG tablet Commonly known as: ELAVIL Stopped by: Kathlene November, MD   cephALEXin 500 MG capsule Commonly known as: KEFLEX Stopped by: Kathlene November, MD   clidinium-chlordiazePOXIDE 5-2.5 MG capsule Commonly known as: LIBRAX Stopped by: Kathlene November, MD   fluconazole 150 MG tablet Commonly known as: DIFLUCAN Stopped by: Kathlene November, MD   metroNIDAZOLE 0.75 % vaginal gel Commonly known as: [place] by: Kathlene November, MD   omeprazole 40 MG capsule Commonly known as: PRILOSEC Stopped by: Kathlene November, MD   phenazopyridine 200 MG tablet Commonly known as: Pyridium Stopped by: Kathlene November, MD     TAKE these medications   acetaminophen-codeine 300-60 MG tablet Commonly known as: TYLENOL #4 Take 1 tablet by mouth every 4 (four) hours as needed for moderate pain.   clonazePAM 0.5 MG tablet Commonly known as: KLONOPIN Take 1 tablet (0.5 mg total) by mouth 3 (three) times daily as needed for anxiety.   FLUoxetine 40 MG capsule Commonly known as: PROZAC Take 40 mg by mouth daily.   fluticasone 50 MCG/ACT nasal spray Commonly known as: FLONASE Place 2 sprays into both nostrils daily.   gabapentin 300 MG capsule Commonly known as: NEURONTIN Take 1 capsule (300 mg total) by mouth  2 (two) times daily. For agitation What changed:   when to take this  additional instructions   hydrOXYzine 25 MG tablet Commonly known as: ATARAX/VISTARIL Take 25 mg by mouth at bedtime.   lamoTRIgine 25 MG tablet Commonly known as: LAMICTAL Take 25 mg by mouth at bedtime.   mirtazapine 45 MG tablet Commonly known as: REMERON Take 22.5 mg by mouth at bedtime.   oxybutynin 5 MG tablet Commonly known as: DITROPAN Take 5 mg by mouth every 8 (eight) hours as needed for bladder spasms.   pantoprazole 40 MG tablet Commonly known as: PROTONIX Take 1 tablet (40 mg total) by mouth daily.   potassium chloride 20 MEQ/15ML (10%) Soln Take 20 mEq by mouth daily.   ProAir HFA 108 (90 Base) MCG/ACT inhaler Generic drug: albuterol INHALE 2 PUFFS EVERY 6 HOURS AS  NEEDED FOR SHORTNESS OF BREATH AND WHEEZING. What changed: See the new instructions.   promethazine 25 MG tablet Commonly known as: PHENERGAN TAKE (1) TABLET EVERY SIX HOURS AS NEEDED FOR NAUSEA AND VOMITING.   promethazine-dextromethorphan 6.25-15 MG/5ML syrup Commonly known as: PROMETHAZINE-DM Take 5 mLs by mouth 4 (four) times daily as needed.   QUEtiapine 100 MG tablet Commonly known as: SEROQUEL Take 400 mg by mouth at bedtime.   rizatriptan 10 MG disintegrating tablet Commonly known as: MAXALT-MLT Take 1 tablet earliest onset of migraine.  May repeat in 2 hours if needed.  Maximum 2 tablets in 24 hours What changed:   how much to take  how to take this  when to take this  reasons to take this   Symbicort 80-4.5 MCG/ACT inhaler Generic drug: budesonide-formoterol Inhale 2 puffs into the lungs 2 (two) times daily.   topiramate 50 MG tablet Commonly known as: Topamax Take 1 tablet (50 mg total) by mouth 2 (two) times daily.          Objective:   Physical Exam Ht 5\' 4"  (1.626 m)   BMI 17.61 kg/m      This is a virtual video visit, she is ambulatory, alert oriented, she looks very thin. Voice  is normal, speech is fluent.  Assessment    44 year old female, multiple medical problems including HTN, COPD, OSA, GERD, IBS, interstitial cystitis, bipolar disorder, s/p hysterectomy, cholecystectomy, on multiple medications presents with:  Persistent nausea and vomiting: Symptoms started 2 to 3 weeks ago, she feels dizzy, has lost 10 or 15 pounds since the problem started. On chart review, she was evaluated by GI last year due to nausea and vomiting, EGD 10/25/2018: - Normal esophagus. - Z-line irregular, 39 cm from the incisors. - Bilious gastric fluid. - Erythematous mucosa in the antrum. Biopsied. - Normal duodenal bulb and second portion of the duodenum. Also had a CT abdomen 12-2018 due to trauma: Nonacute.  I am concerned about the inability to keep fluids down, weight loss and dizziness.  Recommend to go to the ER now for further evaluation. She states she will. Will let PCP know.     I discussed the assessment and treatment plan with the patient. The patient was provided an opportunity to ask questions and all were answered. The patient agreed with the plan and demonstrated an understanding of the instructions.   The patient was advised to call back or seek an in-person evaluation if the symptoms worsen or if the condition fails to improve as anticipated.

## 2019-08-19 NOTE — Progress Notes (Signed)
Pre visit review using our clinic review tool, if applicable. No additional management support is needed unless otherwise documented below in the visit note. 

## 2019-08-20 ENCOUNTER — Encounter (HOSPITAL_COMMUNITY): Payer: Self-pay | Admitting: Emergency Medicine

## 2019-08-20 ENCOUNTER — Telehealth: Payer: Self-pay | Admitting: Internal Medicine

## 2019-08-20 ENCOUNTER — Other Ambulatory Visit: Payer: Self-pay

## 2019-08-20 ENCOUNTER — Emergency Department (HOSPITAL_COMMUNITY)
Admission: EM | Admit: 2019-08-20 | Discharge: 2019-08-20 | Disposition: A | Discharge status: Home / Self Care | Payer: BC Managed Care – PPO | Attending: Emergency Medicine | Admitting: Emergency Medicine

## 2019-08-20 DIAGNOSIS — J449 Chronic obstructive pulmonary disease, unspecified: Secondary | ICD-10-CM | POA: Insufficient documentation

## 2019-08-20 DIAGNOSIS — Z950 Presence of cardiac pacemaker: Secondary | ICD-10-CM | POA: Insufficient documentation

## 2019-08-20 DIAGNOSIS — F17210 Nicotine dependence, cigarettes, uncomplicated: Secondary | ICD-10-CM | POA: Insufficient documentation

## 2019-08-20 DIAGNOSIS — I10 Essential (primary) hypertension: Secondary | ICD-10-CM | POA: Insufficient documentation

## 2019-08-20 DIAGNOSIS — R112 Nausea with vomiting, unspecified: Secondary | ICD-10-CM

## 2019-08-20 DIAGNOSIS — Z79899 Other long term (current) drug therapy: Secondary | ICD-10-CM | POA: Insufficient documentation

## 2019-08-20 DIAGNOSIS — R1110 Vomiting, unspecified: Secondary | ICD-10-CM | POA: Insufficient documentation

## 2019-08-20 LAB — URINALYSIS, ROUTINE W REFLEX MICROSCOPIC
Bilirubin Urine: NEGATIVE
Glucose, UA: NEGATIVE mg/dL
Hgb urine dipstick: NEGATIVE
Ketones, ur: NEGATIVE mg/dL
Nitrite: POSITIVE — AB
Protein, ur: NEGATIVE mg/dL
Specific Gravity, Urine: 1.011 (ref 1.005–1.030)
pH: 6 (ref 5.0–8.0)

## 2019-08-20 LAB — COMPREHENSIVE METABOLIC PANEL
ALT: 12 U/L (ref 0–44)
AST: 23 U/L (ref 15–41)
Albumin: 3.9 g/dL (ref 3.5–5.0)
Alkaline Phosphatase: 107 U/L (ref 38–126)
Anion gap: 9 (ref 5–15)
BUN: 11 mg/dL (ref 6–20)
CO2: 24 mmol/L (ref 22–32)
Calcium: 9 mg/dL (ref 8.9–10.3)
Chloride: 103 mmol/L (ref 98–111)
Creatinine, Ser: 0.62 mg/dL (ref 0.44–1.00)
GFR calc Af Amer: >60 mL/min (ref >60)
GFR calc non Af Amer: >60 mL/min (ref >60)
Glucose, Bld: 93 mg/dL (ref 70–99)
Potassium: 3.6 mmol/L (ref 3.5–5.1)
Sodium: 136 mmol/L (ref 135–145)
Total Bilirubin: 0.3 mg/dL (ref 0.3–1.2)
Total Protein: 7.3 g/dL (ref 6.5–8.1)

## 2019-08-20 LAB — RAPID URINE DRUG SCREEN, HOSP PERFORMED
Amphetamines: NOT DETECTED
Barbiturates: NOT DETECTED
Benzodiazepines: NOT DETECTED
Cocaine: NOT DETECTED
Opiates: POSITIVE — AB
Tetrahydrocannabinol: POSITIVE — AB

## 2019-08-20 LAB — PREGNANCY, URINE: Preg Test, Ur: NEGATIVE

## 2019-08-20 LAB — CBC
HCT: 38.2 % (ref 36.0–46.0)
Hemoglobin: 12.4 g/dL (ref 12.0–15.0)
MCH: 31.1 pg (ref 26.0–34.0)
MCHC: 32.5 g/dL (ref 30.0–36.0)
MCV: 95.7 fL (ref 80.0–100.0)
Platelets: 379 10*3/uL (ref 150–400)
RBC: 3.99 MIL/uL (ref 3.87–5.11)
RDW: 12.4 % (ref 11.5–15.5)
WBC: 7.7 10*3/uL (ref 4.0–10.5)
nRBC: 0 % (ref 0.0–0.2)

## 2019-08-20 LAB — LIPASE, BLOOD: Lipase: 29 U/L (ref 11–51)

## 2019-08-20 MED ORDER — THIAMINE HCL 100 MG/ML IJ SOLN
100.0000 mg | Freq: Once | INTRAMUSCULAR | Status: AC
  Administered 2019-08-20: 100 mg via INTRAVENOUS
  Filled 2019-08-20: qty 2

## 2019-08-20 MED ORDER — DIPHENHYDRAMINE HCL 50 MG/ML IJ SOLN
12.5000 mg | Freq: Once | INTRAMUSCULAR | Status: AC
  Administered 2019-08-20: 12.5 mg via INTRAVENOUS
  Filled 2019-08-20: qty 1

## 2019-08-20 MED ORDER — SODIUM CHLORIDE 0.9 % IV BOLUS
1000.0000 mL | Freq: Once | INTRAVENOUS | Status: AC
  Administered 2019-08-20: 1000 mL via INTRAVENOUS

## 2019-08-20 MED ORDER — MAGNESIUM SULFATE 2 GM/50ML IV SOLN
2.0000 g | Freq: Once | INTRAVENOUS | Status: AC
  Administered 2019-08-20: 2 g via INTRAVENOUS
  Filled 2019-08-20: qty 50

## 2019-08-20 MED ORDER — ONDANSETRON 4 MG PO TBDP
4.0000 mg | ORAL_TABLET | Freq: Once | ORAL | Status: AC
  Administered 2019-08-20: 4 mg via ORAL
  Filled 2019-08-20: qty 1

## 2019-08-20 MED ORDER — KETOROLAC TROMETHAMINE 15 MG/ML IJ SOLN
15.0000 mg | Freq: Once | INTRAMUSCULAR | Status: AC
  Administered 2019-08-20: 15 mg via INTRAVENOUS
  Filled 2019-08-20: qty 1

## 2019-08-20 MED ORDER — CEPHALEXIN 500 MG PO CAPS: 500.0000 mg | ORAL_CAPSULE | Freq: Two times a day (BID) | ORAL | 0 refills | Status: AC

## 2019-08-20 NOTE — Telephone Encounter (Signed)
LMOM informing Pt to return call.  

## 2019-08-20 NOTE — Telephone Encounter (Signed)
Patient was seen with nausea, weight loss, referred to the ER yesterday. Please check on her. Let me know how she is doing.

## 2019-08-20 NOTE — Discharge Instructions (Signed)

## 2019-08-20 NOTE — ED Triage Notes (Signed)
No appetite x 2 months, unable to eat d/t nausea.  Has lost 15 lbs in the past 2 months

## 2019-08-20 NOTE — ED Provider Notes (Signed)
[facility] EMERGENCY DEPARTMENT Provider Note   CSN: [id number] Arrival date & time: 08/20/19  1331     History Chief Complaint  Patient presents with  . Anorexia    [name] is a 44 y.o. female who presents emergency department for vomiting and abdominal pain.  She has a past medical history of anxiety, depression, arthritis, chronic interstitial cystitis, chronic abdominal pain, history of cervical cancer, fibromyalgia, endometriosis, COPD history of intractable vomiting . She states that for the past month she has had vomiting every time she eats.  She has had about a 10 to 15 pound weight loss per the patient.  She states that every time she tries to put something into her body she vomits it out including liquids.  She complains of a migraine headache which she thinks may be due to dehydration.  Her abdominal pain has not gotten any worse.  HPI     Past Medical History:  Diagnosis Date  . 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 Anal fissure 03/11/2009  . Anemia   . Anxiety   . Anxiety and depression   . ARTHRITIS 08/31/2007  . Arthritis   . Asthma   . 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 Bipolar 1 disorder ([place])   . Bowel obstruction ([place])   . Breakdown of urinary electronic stimulator device, init ([place])   . BRONCHITIS, RECURRENT 08/23/2009   Asthmatic Bronchitis-Dr. Melvyn Novas.....-HFA 75% 12/04/2008>75% 02/05/2009>75% 08/04/2009 -PFT's 01/04/2009 2.56 (86%) ratio 75, no resp to B2 and DLC0 67% > 80 after correction   . Cancer (HCC)    cervical cancer  . Chronic interstitial cystitis 03/11/2009  . Chronic nausea   . Chronic pain   . Colon polyps   . COLONIC POLYPS, HX OF 07/25/2006   ADENOMATOUS POLYP  . COPD (chronic obstructive pulmonary disease) ([place])   . DEPRESSION 08/31/2007  . Endometriosis   . FIBROMYALGIA 08/31/2007  . Fibromyalgia   . GERD 02/05/2009  . Hyperlipidemia   . HYPERTENSION 08/31/2007  . IBS 03/11/2009  . Internal hemorrhoids   . Migraine headache    . 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 PONV (postoperative nausea and vomiting)   . RECTAL BLEEDING 03/11/2009  . Seizures ([place])    been about 1 year since last seisure per pt  . SLEEP APNEA 08/31/2007  . Substance abuse ([place])   . Thyroid disease   . Uterine cyst     Patient Active Problem List   Diagnosis Date Noted  . Encounter by telehealth for suspected COVID-19 03/20/2019  . Abdominal pain, epigastric 10/07/2018  . Intractable vomiting 08/09/2018  . Compression fracture of fifth lumbar vertebra (HCC) 02/07/2018  . Altered mental status 02/07/2018  . Generalized weakness 01/24/2018  . MDD (major depressive disorder) 04/23/2017  . Substance abuse ([place]) 04/19/2017  . Hyperthyroidism 02/17/2015  . Anxiety 01/28/2015  . Clinical depression 01/28/2015  . COPD with acute exacerbation ([place]) 11/26/2013  . Vaginitis and vulvovaginitis 11/26/2013  . Recurrent pneumonia 02/02/2013  . Left arm pain 01/13/2013  . Left arm pain 01/13/2013    Class: Acute  . Dizziness and giddiness 01/13/2013  . Acute bronchitis 12/07/2012  . Rib pain 12/07/2012  . Insomnia 08/26/2012  . Diarrhea 06/19/2012  . Heme positive stool 06/19/2012  . Bloating 06/19/2012  . Nausea alone 06/19/2012  . Bladder retention 04/24/2012  . Tremor 03/05/2012  . Dysuria 03/05/2012  . Failure to thrive in adult 06/21/2011  . Airway hyperreactivity 03/08/2011  . Back pain, chronic 03/08/2011  . Chronic  obstructive pulmonary disease ([place]) 03/08/2011  . Acid reflux 03/08/2011  . Cystitis 01/22/2011  . Bilateral hand numbness 01/13/2011  . WEIGHT LOSS 06/24/2010  . RIB PAIN, LEFT SIDED 05/04/2010  . ABDOMINAL PAIN, LEFT UPPER QUADRANT 05/04/2010  . Unspecified otitis media 10/26/2009  . BRONCHITIS, RECURRENT 08/23/2009  . URI, ACUTE 08/04/2009  . FATIGUE 07/06/2009  . IBS 03/11/2009  . ANAL FISSURE 03/11/2009  . RECTAL BLEEDING 03/11/2009  . Chronic interstitial cystitis 03/11/2009  . COLONIC POLYPS, HX OF  03/11/2009  . GERD 02/05/2009  . SMOKER 12/04/2008  . chronic asthma poorly controlled 12/04/2008  . 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 Bipolar disorder ([place]) 08/31/2007  . HYPERTENSION 08/31/2007  . EMPHYSEMA 08/31/2007  . 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 ARTHRITIS 08/31/2007  . FIBROMYALGIA 08/31/2007  . SLEEP APNEA 08/31/2007    Past Surgical History:  Procedure Laterality Date  . ABDOMINAL HYSTERECTOMY    . BIOPSY  10/25/2018   Procedure: BIOPSY;  Surgeon: Rogene Houston, MD;  [facility];  Service: Endoscopy;;  gastric  . bladder stretching x6    . BLADDER SURGERY     stimulator placed and stretching   . CHOLECYSTECTOMY    . COLONOSCOPY    . COLONOSCOPY WITH PROPOFOL N/A 09/03/2014   Procedure: COLONOSCOPY WITH PROPOFOL;  Surgeon: Milus Banister, MD;  [facility];  Service: Endoscopy;  Laterality: N/A;  . ESOPHAGOGASTRODUODENOSCOPY (EGD) WITH PROPOFOL N/A 10/25/2018   Procedure: ESOPHAGOGASTRODUODENOSCOPY (EGD) WITH PROPOFOL;  Surgeon: Rogene Houston, MD;  [facility];  Service: Endoscopy;  Laterality: N/A;  11:15  . interstitial cystitis    . PACEMAKER INSERTION     in hip for interstitial cystitis  . removal of uterine cyst and scrapped uterus    . replaced bladder pacemaker       OB History    Gravida  3   Para  2   Term      Preterm      AB  1   Living  2     SAB  1   TAB  0   Ectopic  0   Multiple  0   Live Births  2           Family History  Problem Relation Age of Onset  . Heart disease Father   . Asthma Maternal Grandmother   . Emphysema Maternal Grandfather   . Cancer Maternal Grandfather        Lung Cancer  . Cancer Other        Lung Cancer-Aunt  . Colon cancer Neg Hx   . Esophageal cancer Neg Hx   . Rectal cancer Neg Hx   . Stomach cancer Neg Hx   . Thyroid disease Neg Hx     Social History   Tobacco Use  . Smoking status: Current Every Day  Smoker    Packs/day: 0.50    Years: 14.00    Pack years: 7.00    Types: Cigarettes  . Smokeless tobacco: Never Used  Substance Use Topics  . Alcohol use: Not Currently    Comment: last drink 3 weeks ago; recently released from rehab  . Drug use: Not Currently    Types: Marijuana    Comment: once a month    Home Medications Prior to Admission medications   Medication Sig Start Date End Date Taking? Authorizing Provider  acetaminophen-codeine (TYLENOL #4) 300-60 MG tablet Take 1 tablet by mouth every  4 (four) hours as needed for moderate pain. Patient not taking: Reported on 08/19/2019 12/19/18   Drenda Freeze, MD  budesonide-formoterol [facility]) 80-4.5 MCG/ACT inhaler Inhale 2 puffs into the lungs 2 (two) times daily.  12/13/17   [provider]  clonazePAM (KLONOPIN) 0.5 MG tablet Take 1 tablet (0.5 mg total) by mouth 3 (three) times daily as needed for anxiety. 05/01/19   [name], DO  FLUoxetine (PROZAC) 40 MG capsule Take 40 mg by mouth daily. 09/23/18   [provider]  fluticasone (FLONASE) 50 MCG/ACT nasal spray Place 2 sprays into both nostrils daily. 04/22/19   [name], DO  gabapentin (NEURONTIN) 300 MG capsule Take 1 capsule (300 mg total) by mouth 2 (two) times daily. For agitation Patient taking differently: Take 300 mg by mouth 3 (three) times daily.  01/25/18   Barton Dubois, MD  hydrOXYzine (ATARAX/VISTARIL) 25 MG tablet Take 25 mg by mouth at bedtime.  08/26/18   [provider]  lamoTRIgine (LAMICTAL) 25 MG tablet Take 25 mg by mouth at bedtime.  12/13/18   [provider]  mirtazapine (REMERON) 45 MG tablet Take 22.5 mg by mouth at bedtime.  12/13/18   [provider]  oxybutynin (DITROPAN) 5 MG tablet Take 5 mg by mouth every 8 (eight) hours as needed for bladder spasms.    [provider]  pantoprazole (PROTONIX) 40 MG tablet Take 1 tablet (40 mg total) by mouth daily. 09/23/18   Setzer, Rona Ravens, NP  potassium chloride 20 MEQ/15ML (10%) SOLN Take 20 mEq by mouth daily.  11/06/18   [provider]  PROAIR HFA 108 (90 Base) MCG/ACT inhaler INHALE 2 PUFFS EVERY 6 HOURS AS NEEDED FOR SHORTNESS OF BREATH AND WHEEZING. Patient taking differently: Inhale 2 puffs into the lungs every 6 (six) hours as needed for wheezing or shortness of breath.  09/19/18   Roma Schanz R, DO  promethazine (PHENERGAN) 25 MG tablet TAKE (1) TABLET EVERY SIX HOURS AS NEEDED FOR NAUSEA AND VOMITING. Patient not taking: Reported on 08/19/2019 05/27/19   Pieter Partridge, DO  promethazine-dextromethorphan (PROMETHAZINE-DM) 6.25-15 MG/5ML syrup Take 5 mLs by mouth 4 (four) times daily as needed. Patient not taking: Reported on 08/19/2019 04/22/19   Carollee Herter, Alferd Apa, DO  QUEtiapine (SEROQUEL) 100 MG tablet Take 400 mg by mouth at bedtime.  09/16/18   [provider]  rizatriptan (MAXALT-MLT) 10 MG disintegrating tablet Take 1 tablet earliest onset of migraine.  May repeat in 2 hours if needed.  Maximum 2 tablets in 24 hours Patient taking differently: Take 10 mg by mouth daily as needed for migraine. Take 1 tablet earliest onset of migraine.  May repeat in 2 hours if needed.  Maximum 2 tablets in 24 hours 09/03/18   Metta Clines R, DO  topiramate (TOPAMAX) 50 MG tablet Take 1 tablet (50 mg total) by mouth 2 (two) times daily. 03/20/19   [name], DO    Allergies    Abilify [aripiprazole], Amitriptyline, Metoclopramide hcl, Propoxyphene, Tramadol, Ambien [zolpidem tartrate], Eszopiclone, Codeine, Varenicline, Buprenorphine hcl, Demerol [meperidine], Emetrol, and Morphine and related  Review of Systems   Review of Systems Ten systems reviewed and are negative for acute change, except as noted in the HPI.   Physical Exam Updated Vital Signs BP 102/81 (BP Location: Right Arm)   Pulse 82   Temp 97.9 F (36.6 C) (Oral)   Resp 19   Ht 5\' 2"  (  1.575 m)   Wt 41.7 kg   SpO2 96%   BMI 16.83  kg/m   Physical Exam Vitals and nursing note reviewed.  Constitutional:      General: She is not in acute distress.    Appearance: She is underweight. She is not diaphoretic.  HENT:     Head: Normocephalic and atraumatic.  Eyes:     General: No scleral icterus.    Conjunctiva/sclera: Conjunctivae normal.  Cardiovascular:     Rate and Rhythm: Normal rate and regular rhythm.     Heart sounds: Normal heart sounds. No murmur. No friction rub. No gallop.   Pulmonary:     Effort: Pulmonary effort is normal. No respiratory distress.     Breath sounds: Normal breath sounds.  Abdominal:     General: Abdomen is scaphoid. Bowel sounds are normal. There is no distension.     Palpations: Abdomen is soft. There is no mass.     Tenderness: There is generalized abdominal tenderness. There is no guarding.  Musculoskeletal:     Cervical back: Normal range of motion.  Skin:    General: Skin is warm and dry.  Neurological:     Mental Status: She is oriented to person, place, and time.  Psychiatric:        Behavior: Behavior normal.     ED Results / Procedures / Treatments   Labs (all labs ordered are listed, but only abnormal results are displayed) Labs Reviewed  URINALYSIS, ROUTINE W REFLEX MICROSCOPIC - Abnormal; Notable for the following components:      Result Value   Nitrite POSITIVE (*)    Leukocytes,Ua SMALL (*)    Bacteria, UA RARE (*)    All other components within normal limits  RAPID URINE DRUG SCREEN, HOSP PERFORMED - Abnormal; Notable for the following components:   Opiates POSITIVE (*)    Tetrahydrocannabinol POSITIVE (*)    All other components within normal limits  LIPASE, BLOOD  COMPREHENSIVE METABOLIC PANEL  CBC  PREGNANCY, URINE  POC URINE PREG, ED    EKG None  Radiology No results found.  Procedures Procedures (including critical care time)  Medications Ordered in ED Medications - No data to display  ED Course  I have reviewed the triage vital signs  and the nursing notes.  Pertinent labs & imaging results that were available during my care of the patient were reviewed by me and considered in my medical decision making (see chart for details).    MDM Rules/Calculators/A&P                     [id number] VS: BP 117/83 (BP Location: Left Arm)   Pulse 74   Temp 97.9 F (36.6 C) (Oral)   Resp 16   Ht 5\' 2"  (1.575 m)   Wt 41.7 kg   SpO2 99%   BMI 16.83 kg/m  [id number] is gathered by patient and emr. Previous records obtained and reviewed. DDX:The patient's complaint of vomiting involves an extensive number of diagnostic and treatment options, and is a complaint that carries with it a high risk of complications, morbidity, and potential mortality. Given the large differential diagnosis, medical decision making is of high complexity. The emergent differential diagnosis for vomiting includes, but is not limited to ACS/MI, Boerhaave's, DKA, Intracranial Hemorrhage, Ischemic bowel, Meningitis, Sepsis, Acute radiation syndrome, Acute gastric dilation, Acetaminophen toxicity, Adrenal insufficiency, Appendicitis, Aspirin toxicity, Bowel obstruction/ileus, Carbon monoxide poisoning, Cholecystitis, CNS tumor. Digoxin toxicity, Electrolyte abnormalities, Elevated ICP, Gastric outlet  obstruction, Hyperemesis gravidarum, Pancreatitis, Peritonitis, Ruptured viscus, Testicular torsion/ovarian torsion, Theophyline toxicity, Biliary colic, Cannabinoid hyperemesis syndrome, Chemotherapy, Disulfiram effect, Erythromycin, ETOH, Gastritis, Gastroenteritis, Gastroparesis, Hepatitis, Ibuprofen, Ipecac toxicity, Labyrinthitis, Migraine, Motion sickness, Narcotic withdrawal, Thyroid, Pregnancy, Peptic ulcer disease, Renal colic, and UTI Labs: I ordered reviewed and interpreted labs which include Lipase, cbc, U-Preg, CMP all WNL. UA + for UTI Imaging:N/A EKG:n/a Consults:n/a [id number] here with complaint of vomiting.  She also had a headache which was treated  and resolved here in the emergency department.  She has had no active vomiting here in the emergency department.  Urine present infected and patient will be treated with Keflex.  Have ordered Zofran for nausea control.  Patient appears otherwise appropriate for discharge at this time and he follow close with PCP. Patient disposition: Discharge The patient appears reasonably screened and/or stabilized for discharge and I doubt any other medical condition or other [facility] requiring further screening, evaluation, or treatment in the ED at this time prior to discharge. I have discussed lab and/or imaging findings with the patient and answered all questions/concerns to the best of my ability.I have discussed return precautions and OP follow up.    Final Clinical Impression(s) / ED Diagnoses Final diagnoses:  None    Rx / DC Orders ED Discharge Orders    None       Margarita Mail, PA-C 08/20/19 2049    Milton Ferguson, MD 08/20/19 2317

## 2019-08-20 NOTE — Telephone Encounter (Signed)
Current at ED.

## 2019-08-21 NOTE — Telephone Encounter (Signed)
Seen, eval and d/c home

## 2019-08-25 ENCOUNTER — Other Ambulatory Visit: Payer: Self-pay

## 2019-08-25 ENCOUNTER — Ambulatory Visit: Payer: BC Managed Care – PPO | Admitting: Medical

## 2019-08-25 VITALS — BP 91/75 | HR 94 | Temp 97.1°F | Resp 18 | Ht 62.0 in | Wt 92.2 lb

## 2019-08-25 DIAGNOSIS — R634 Abnormal weight loss: Secondary | ICD-10-CM

## 2019-08-25 DIAGNOSIS — R630 Anorexia: Secondary | ICD-10-CM | POA: Diagnosis not present

## 2019-08-25 DIAGNOSIS — R109 Unspecified abdominal pain: Secondary | ICD-10-CM

## 2019-08-25 DIAGNOSIS — R112 Nausea with vomiting, unspecified: Secondary | ICD-10-CM | POA: Diagnosis not present

## 2019-08-25 MED ORDER — PROMETHAZINE HCL 25 MG PO TABS: ORAL_TABLET | ORAL | 0 refills | Status: DC

## 2019-08-25 NOTE — Progress Notes (Signed)
Subjective:    Patient ID: [name], female    DOB: 1976-01-06, 44 y.o.   MRN: [id number]  HPI Pt is in for follow up from the ED. She was seen by provider on 5-02-10-2020 who advised be seen in ED. So she went the next day.  [name] is a 44 y.o. female who presents emergency department for vomiting and abdominal pain.  She has a past medical history of anxiety, depression, arthritis, chronic interstitial cystitis, chronic abdominal pain, history of cervical cancer, fibromyalgia, endometriosis, COPD history of intractable vomiting . She states that for the past month she has had vomiting every time she eats.  She has had about a 10 to 15 pound weight loss per the patient.  She states that every time she tries to put something into her body she vomits it out including liquids.  She complains of a migraine headache which she thinks may be due to dehydration.  Her abdominal pain has not gotten any worse.    [id number] VS: BP 117/83 (BP Location: Left Arm)   Pulse 74   Temp 97.9 F (36.6 C) (Oral)   Resp 16   Ht 5\' 2"  (1.575 m)   Wt 41.7 kg   SpO2 99%   BMI 16.83 kg/m  [id number] is gathered by patient and emr. Previous records obtained and reviewed. DDX:The patient's complaint of vomiting involves an extensive number of diagnostic and treatment options, and is a complaint that carries with it a high risk of complications, morbidity, and potential mortality. Given the large differential diagnosis, medical decision making is of high complexity. The emergent differential diagnosis for vomiting includes, but is not limited to ACS/MI, Boerhaave's, DKA, Intracranial Hemorrhage, Ischemic bowel, Meningitis, Sepsis, Acute radiation syndrome, Acute gastric dilation, Acetaminophen toxicity, Adrenal insufficiency, Appendicitis, Aspirin toxicity, Bowel obstruction/ileus, Carbon monoxide poisoning, Cholecystitis, CNS tumor. Digoxin toxicity, Electrolyte abnormalities, Elevated ICP, Gastric outlet  obstruction, Hyperemesis gravidarum, Pancreatitis, Peritonitis, Ruptured viscus, Testicular torsion/ovarian torsion, Theophyline toxicity, Biliary colic, Cannabinoid hyperemesis syndrome, Chemotherapy, Disulfiram effect, Erythromycin, ETOH, Gastritis, Gastroenteritis, Gastroparesis, Hepatitis, Ibuprofen, Ipecac toxicity, Labyrinthitis, Migraine, Motion sickness, Narcotic withdrawal, Thyroid, Pregnancy, Peptic ulcer disease, Renal colic, and UTI Labs: I ordered reviewed and interpreted labs which include Lipase, cbc, U-Preg, CMP all WNL. UA + for UTI Imaging:N/A EKG:n/a Consults:n/a [id number] here with complaint of vomiting.  She also had a headache which was treated and resolved here in the emergency department.  She has had no active vomiting here in the emergency department.  Urine present infected and patient will be treated with Keflex.  Have ordered Zofran for nausea control.  Patient appears otherwise appropriate for discharge at this time and he follow close with PCP. Patient disposition: Discharge The patient appears reasonably screened and/or stabilized for discharge and I doubt any other medical condition or other [facility] requiring further screening, evaluation, or treatment in the ED at this time prior to discharge. I have discussed lab and/or imaging findings with the patient and answered all questions/concerns to the best of my ability.I have discussed return precautions and OP follow up.   Pt was given iv fluids, potassium and treated for uti.  Pt continue she tells me she still can't eat anything. Pt states when she eats will vomit. She basically states now not eating much at all since she will vomit up food. She states honestly now just eating once a week.  [place] drug screen positive for opiates and marijuana.   Pt psychiatrist has her on clonazepam. Pt has  tylenol #4. Gave this for IC by urologist per pt.   Pt prior ct abd pelvis showed.  Wt loss of about 21 lbs before weight  loss.  1. Heterogeneous opacity of the dependent subpleural right upper lobe (series 5, image 34), consistent with pulmonary contusion. There is no overlying rib fracture.  2. No other evidence of acute traumatic injury to the chest, abdomen, or pelvis.  3.  No fracture or dislocation of the thoracic spine.  4. No acute fracture or dislocation of the lumbar spine. Unchanged superior endplate deformity of L5, as seen on prior radiographs dated 11/28/2018.  5. Other chronic, incidental, and postoperative findings as detailed Above.   Pt on keflex for uti. Will do urine culture.   Review of Systems  Constitutional: Positive for unexpected weight change. Negative for chills, fatigue and fever.  Respiratory: Negative for chest tightness, shortness of breath and wheezing.   Cardiovascular: Negative for chest pain and palpitations.  Gastrointestinal: Positive for abdominal pain, nausea and vomiting. Negative for abdominal distention, constipation, diarrhea and rectal pain.  Genitourinary: Negative for decreased urine volume, difficulty urinating, dysuria, frequency, urgency and vaginal bleeding.  Musculoskeletal: Positive for back pain.  Skin: Negative for rash.  Neurological: Negative for dizziness, numbness and headaches.  Hematological: Negative for adenopathy. Does not bruise/bleed easily.  Psychiatric/Behavioral: Negative for behavioral problems and confusion.    Past Medical History:  Diagnosis Date  . 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 Anal fissure 03/11/2009  . Anemia   . Anxiety   . Anxiety and depression   . ARTHRITIS 08/31/2007  . Arthritis   . Asthma   . 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 Bipolar 1 disorder ([place])   . Bowel obstruction ([place])   . Breakdown of urinary electronic stimulator device, init ([place])   . BRONCHITIS, RECURRENT 08/23/2009   Asthmatic Bronchitis-Dr. Melvyn Novas.....-HFA 75% 12/04/2008>75% 02/05/2009>75% 08/04/2009 -PFT's 01/04/2009 2.56 (86%)  ratio 75, no resp to B2 and DLC0 67% > 80 after correction   . Cancer (HCC)    cervical cancer  . Chronic interstitial cystitis 03/11/2009  . Chronic nausea   . Chronic pain   . Colon polyps   . COLONIC POLYPS, HX OF 07/25/2006   ADENOMATOUS POLYP  . COPD (chronic obstructive pulmonary disease) ([place])   . DEPRESSION 08/31/2007  . Endometriosis   . FIBROMYALGIA 08/31/2007  . Fibromyalgia   . GERD 02/05/2009  . Hyperlipidemia   . HYPERTENSION 08/31/2007  . IBS 03/11/2009  . Internal hemorrhoids   . Migraine headache   . 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 PONV (postoperative nausea and vomiting)   . RECTAL BLEEDING 03/11/2009  . Seizures ([place])    been about 1 year since last seisure per pt  . SLEEP APNEA 08/31/2007  . Substance abuse ([place])   . Thyroid disease   . Uterine cyst      Social History   Socioeconomic History  . Marital status: Married    Spouse name: Not on file  . Number of children: 2  . Years of education: Not on file  . Highest education level: Not on file  Occupational History    Employer: UNEMPLOYED  Tobacco Use  . Smoking status: Current Every Day Smoker    Packs/day: 0.50    Years: 14.00    Pack years: 7.00    Types: Cigarettes  . Smokeless tobacco: Never Used  Substance and Sexual Activity  . Alcohol use: Not Currently    Comment: last drink 3 weeks ago; recently released  from rehab  . Drug use: Not Currently    Types: Marijuana    Comment: once a month  . Sexual activity: Not on file  Other Topics Concern  . Not on file  Social History Narrative   Homemaker   Daily Caffeine Use-Mtn. Dew         Social Determinants of Health   Financial Resource Strain:   . Difficulty of Paying Living Expenses:   Food Insecurity:   . Worried About Charity fundraiser in the Last Year:   . Arboriculturist in the Last Year:   Transportation Needs:   . Film/video editor (Medical):   Marland Kitchen Lack of Transportation (Non-Medical):   Physical Activity:   . Days of  Exercise per Week:   . Minutes of Exercise per Session:   Stress:   . Feeling of Stress :   Social Connections:   . Frequency of Communication with Friends and Family:   . Frequency of Social Gatherings with Friends and Family:   . Attends Religious Services:   . Active Member of Clubs or Organizations:   . Attends Archivist Meetings:   Marland Kitchen Marital Status:   Intimate Partner Violence:   . Fear of Current or Ex-Partner:   . Emotionally Abused:   Marland Kitchen Physically Abused:   . Sexually Abused:     Past Surgical History:  Procedure Laterality Date  . ABDOMINAL HYSTERECTOMY    . BIOPSY  10/25/2018   Procedure: BIOPSY;  Surgeon: Rogene Houston, MD;  Location: [facility];  Service: Endoscopy;;  gastric  . bladder stretching x6    . BLADDER SURGERY     stimulator placed and stretching   . CHOLECYSTECTOMY    . COLONOSCOPY    . COLONOSCOPY WITH PROPOFOL N/A 09/03/2014   Procedure: COLONOSCOPY WITH PROPOFOL;  Surgeon: Milus Banister, MD;  Location: [facility];  Service: Endoscopy;  Laterality: N/A;  . ESOPHAGOGASTRODUODENOSCOPY (EGD) WITH PROPOFOL N/A 10/25/2018   Procedure: ESOPHAGOGASTRODUODENOSCOPY (EGD) WITH PROPOFOL;  Surgeon: Rogene Houston, MD;  Location: [facility];  Service: Endoscopy;  Laterality: N/A;  11:15  . interstitial cystitis    . PACEMAKER INSERTION     in hip for interstitial cystitis  . removal of uterine cyst and scrapped uterus    . replaced bladder pacemaker      Family History  Problem Relation Age of Onset  . Heart disease Father   . Asthma Maternal Grandmother   . Emphysema Maternal Grandfather   . Cancer Maternal Grandfather        Lung Cancer  . Cancer Other        Lung Cancer-Aunt  . Colon cancer Neg Hx   . Esophageal cancer Neg Hx   . Rectal cancer Neg Hx   . Stomach cancer Neg Hx   . Thyroid disease Neg Hx     Allergies  Allergen Reactions  . Abilify [Aripiprazole] Swelling, Palpitations and Other (See Comments)    Throat  swelling, tremors   . Amitriptyline Anaphylaxis    Angioedema  . Metoclopramide Hcl Other (See Comments)    Causes seizures  . Propoxyphene Rash  . Tramadol Swelling, Other (See Comments) and Rash    Throat swelling, tremors  . Ambien [Zolpidem Tartrate] Other (See Comments)    hallucinations  . Eszopiclone Other (See Comments)    Hallucinations, hyperactivity, and bad taste in mouth   . Codeine Other (See Comments)    Tylenol #3 caused  itching  . Varenicline Other (See Comments)    Suicidal thoughts  . Buprenorphine Hcl Itching and Hives  . Demerol [Meperidine] Rash  . Emetrol Hives, Itching and Rash  . Morphine And Related Hives and Itching    Current Outpatient Medications on File Prior to Visit  Medication Sig Dispense Refill  . acetaminophen-codeine (TYLENOL #4) 300-60 MG tablet Take 1 tablet by mouth every 4 (four) hours as needed for moderate pain.    . budesonide-formoterol (SYMBICORT) 80-4.5 MCG/ACT inhaler Inhale 2 puffs into the lungs 2 (two) times daily.     . cephALEXin (KEFLEX) 500 MG capsule Take 1 capsule (500 mg total) by mouth 2 (two) times daily for 7 days. 14 capsule 0  . clonazePAM (KLONOPIN) 0.5 MG tablet Take 1 tablet (0.5 mg total) by mouth 3 (three) times daily as needed for anxiety. 90 tablet 1  . fluticasone (FLONASE) 50 MCG/ACT nasal spray Place 2 sprays into both nostrils daily. (Patient taking differently: Place 2 sprays into both nostrils daily as needed for allergies. ) 16 g 6  . gabapentin (NEURONTIN) 300 MG capsule Take 1 capsule (300 mg total) by mouth 2 (two) times daily. For agitation (Patient taking differently: Take 300 mg by mouth 3 (three) times daily. )    . hydrOXYzine (ATARAX/VISTARIL) 25 MG tablet Take 25 mg by mouth at bedtime.     . lamoTRIgine (LAMICTAL) 25 MG tablet Take 25 mg by mouth at bedtime.     . mirtazapine (REMERON) 45 MG tablet Take 22.5 mg by mouth at bedtime.     Marland Kitchen oxybutynin (DITROPAN) 5 MG tablet Take 5 mg by mouth  every 8 (eight) hours as needed for bladder spasms.    . pantoprazole (PROTONIX) 40 MG tablet Take 1 tablet (40 mg total) by mouth daily. (Patient not taking: Reported on 08/25/2019) 90 tablet 3  . potassium chloride 20 MEQ/15ML (10%) SOLN Take 20 mEq by mouth daily.     Marland Kitchen PROAIR HFA 108 (90 Base) MCG/ACT inhaler INHALE 2 PUFFS EVERY 6 HOURS AS NEEDED FOR SHORTNESS OF BREATH AND WHEEZING. (Patient not taking: No sig reported) 8.5 g 1  . promethazine (PHENERGAN) 25 MG tablet TAKE (1) TABLET EVERY SIX HOURS AS NEEDED FOR NAUSEA AND VOMITING. (Patient not taking: *May take one tablet every 6 hours as needed for nausea and vomiting) 20 tablet 2  . QUEtiapine (SEROQUEL) 400 MG tablet Take 400 mg by mouth at bedtime.     . rizatriptan (MAXALT-MLT) 10 MG disintegrating tablet Take 1 tablet earliest onset of migraine.  May repeat in 2 hours if needed.  Maximum 2 tablets in 24 hours (Patient not taking: Reported on 08/25/2019) 9 tablet 3  . topiramate (TOPAMAX) 50 MG tablet Take 1 tablet (50 mg total) by mouth 2 (two) times daily. 60 tablet 5   No current facility-administered medications on file prior to visit.    BP 91/75 (BP Location: Right Arm, Patient Position: Sitting, Cuff Size: Normal)   Pulse 94   Temp (!) 97.1 F (36.2 C) (Temporal)   Resp 18   Ht 5\' 2"  (1.575 m)   Wt 92 lb 3.2 oz (41.8 kg)   SpO2 92%   BMI 16.86 kg/m       Objective:   Physical Exam  General- No acute distress. Pleasant patient. Neck- Full range of motion, no jvd Lungs- Clear, even and unlabored. Heart- regular rate and rhythm. Neurologic- CNII- XII grossly intact. Abdomen- soft, tender moderat diffuse tender. No rebound.  Back - left cva tenderness.     Assessment & Plan:  Sorry to see that you continue to have daily nausea, vomiting and persistent weight loss over the last 2 years.  Recent work-up in the emergency department was negative.  You report they gave you IV hydration and antibiotics for UTI.  We  will repeat labs today to make sure no acute abnormality.  Want to make sure that you are electrolytes look stable that you are not dehydrated.  I will get urine culture today since you report urinary infection but I do not see that a culture was done through the ED.  Continue Keflex.  If you have worsening or changing signs symptoms have to recommend to be seen again at the emergency department.  Will prescribe phenergan for nausea or vomiting.  I will send copy of this note to your PCP.  Requested you follow-up with PCP in 7 to 10 days.  I did go ahead and put in CT abdomen pelvis order as it looks like last July of the gastroenterologist try to order CT imaging but for some reason that was not done.  On exam he indicated there might have been a scheduling conflict with test was not done.  Time spent with patient today was 40  minutes which consisted of chart revdiew, discussing diagnosis, work up treatment and documentation.  Mackie Pai, PA-C

## 2019-08-25 NOTE — Patient Instructions (Addendum)
Sorry to see that you continue to have daily nausea, vomiting and persistent weight loss over the last 2 years.  Recent work-up in the emergency department was negative.  You report they gave you IV hydration and antibiotics for UTI.  We will repeat labs today to make sure no acute abnormality.  Want to make sure that you are electrolytes look stable that you are not dehydrated.  I will get urine culture today since you report urinary infection but I do not see that a culture was done through the ED.  Continue Keflex.  If you have worsening or changing signs symptoms have to recommend to be seen again at the emergency department.  Will prescribe phenrgan for nausea or vomiting.  I will send copy of this note to your PCP.  Requested you follow-up with PCP in 7 to 10 days.  I did go ahead and put in CT abdomen pelvis order as it looks like last July of the gastroenterologist try to order CT imaging but for some reason that was not done.  On exam he indicated there might have been a scheduling conflict with test was not done.

## 2019-08-26 LAB — URINE CULTURE
MICRO NUMBER:: 10485295
Result:: NO GROWTH
SPECIMEN QUALITY:: ADEQUATE

## 2019-08-28 ENCOUNTER — Ambulatory Visit (HOSPITAL_BASED_OUTPATIENT_CLINIC_OR_DEPARTMENT_OTHER): Payer: BC Managed Care – PPO

## 2019-08-28 ENCOUNTER — Other Ambulatory Visit: Payer: Self-pay | Admitting: Medical

## 2019-08-28 ENCOUNTER — Telehealth: Payer: Self-pay | Admitting: Medical

## 2019-08-28 ENCOUNTER — Ambulatory Visit (HOSPITAL_BASED_OUTPATIENT_CLINIC_OR_DEPARTMENT_OTHER): Admission: RE | Admit: 2019-08-28 | Payer: BC Managed Care – PPO | Source: Ambulatory Visit

## 2019-08-28 ENCOUNTER — Other Ambulatory Visit (HOSPITAL_BASED_OUTPATIENT_CLINIC_OR_DEPARTMENT_OTHER): Payer: BC Managed Care – PPO

## 2019-08-28 ENCOUNTER — Other Ambulatory Visit: Payer: BC Managed Care – PPO

## 2019-08-28 DIAGNOSIS — R634 Abnormal weight loss: Secondary | ICD-10-CM

## 2019-08-28 DIAGNOSIS — R112 Nausea with vomiting, unspecified: Secondary | ICD-10-CM

## 2019-08-28 DIAGNOSIS — R109 Unspecified abdominal pain: Secondary | ICD-10-CM

## 2019-08-28 DIAGNOSIS — R630 Anorexia: Secondary | ICD-10-CM

## 2019-08-28 NOTE — Telephone Encounter (Signed)
She is not very compliant  [name],  try to call her tomorrow --- remind her she needed labs drawn  That's all we can do

## 2019-08-28 NOTE — Addendum Note (Signed)
Addended by: Kelle Darting A on: 08/28/2019 10:44 AM   Modules accepted: Orders

## 2019-08-28 NOTE — Telephone Encounter (Signed)
I saw [name] the other day. Wanted you to be aware since she has chronic nausea and weight loss. Cause not determined.  I ordered labs on her shortly after 4 and I was informed next day she did not get labs done since lab was closed. I asked staff to call and have her come back. She still has not returned.  Wanted to keep you in loop as challenging case.  I placed ct order as well. Looks like GI ordered that in past but was not done.  Thanks, Percell Miller

## 2019-08-29 ENCOUNTER — Telehealth: Payer: Self-pay | Admitting: Family Medicine

## 2019-08-29 DIAGNOSIS — J209 Acute bronchitis, unspecified: Secondary | ICD-10-CM

## 2019-08-29 NOTE — Telephone Encounter (Signed)
Please advise on refill.

## 2019-08-29 NOTE — Telephone Encounter (Signed)
Medication: budesonide-formoterol (SYMBICORT) 80-4.5 MCG/ACT inhaler [id number]    PROAIR HFA 108 (90 Base) MCG/ACT inhaler [id number]   Has the patient contacted their pharmacy? No. (If no, request that the patient contact the pharmacy for the refill.) (If yes, when and what did the pharmacy advise?)  Preferred Pharmacy (with phone number or street name): [place] [place]  [address] [place], [place] [address]  Phone:  [phone] Fax:  [phone]  DEA #:  --  Agent: Please be advised that RX refills may take up to 3 business days. We ask that you follow-up with your pharmacy.

## 2019-08-30 MED ORDER — ALBUTEROL SULFATE HFA 108 (90 BASE) MCG/ACT IN AERS: INHALATION_SPRAY | RESPIRATORY_TRACT | 1 refills | Status: DC

## 2019-08-30 MED ORDER — BUDESONIDE-FORMOTEROL FUMARATE 80-4.5 MCG/ACT IN AERO: 2.0000 | INHALATION_SPRAY | Freq: Two times a day (BID) | RESPIRATORY_TRACT | 2 refills | Status: DC

## 2019-09-02 DIAGNOSIS — F411 Generalized anxiety disorder: Secondary | ICD-10-CM | POA: Diagnosis not present

## 2019-09-02 DIAGNOSIS — F1011 Alcohol abuse, in remission: Secondary | ICD-10-CM | POA: Diagnosis not present

## 2019-09-02 DIAGNOSIS — Z79899 Other long term (current) drug therapy: Secondary | ICD-10-CM | POA: Diagnosis not present

## 2019-09-02 DIAGNOSIS — F319 Bipolar disorder, unspecified: Secondary | ICD-10-CM | POA: Diagnosis not present

## 2019-09-02 NOTE — Telephone Encounter (Signed)
Pt didn't answer the phone

## 2019-09-11 ENCOUNTER — Encounter: Payer: BC Managed Care – PPO | Admitting: Counselor

## 2019-09-11 NOTE — Progress Notes (Deleted)
[facility]  Patient Name: [name] MRN: [id number] Date of Birth: [birth year] Age: 44 y.o. Education: ***  Referral Circumstances and Background Information  Ms. [name] is a 44 y.o., {pshandedness:23626} dominant, *** with a history of bipolar disorder, fibromyalgia, hypertension, migraine headaches, and recurrent nausea/vomiting. She also has memory problems and was referred for evaluation by her PCP, Dr. Carollee Orozco. She recently consulted with my neurology colleague, Dr. Tomi Orozco, who is following her for migraines.    On interview, ***  Past Medical History and Review of Relevant Studies   Patient Active Problem List   Diagnosis Date Noted  . Encounter by telehealth for suspected COVID-19 03/20/2019  . Abdominal pain, epigastric 10/07/2018  . Intractable vomiting 08/09/2018  . Compression fracture of fifth lumbar vertebra (HCC) 02/07/2018  . Altered mental status 02/07/2018  . Generalized weakness 01/24/2018  . MDD (major depressive disorder) 04/23/2017  . Substance abuse ([place]) 04/19/2017  . Hyperthyroidism 02/17/2015  . Anxiety 01/28/2015  . Clinical depression 01/28/2015  . COPD with acute exacerbation ([place]) 11/26/2013  . Vaginitis and vulvovaginitis 11/26/2013  . Recurrent pneumonia 02/02/2013  . Left arm pain 01/13/2013  . Left arm pain 01/13/2013    Class: Acute  . Dizziness and giddiness 01/13/2013  . Acute bronchitis 12/07/2012  . Rib pain 12/07/2012  . Insomnia 08/26/2012  . Diarrhea 06/19/2012  . Heme positive stool 06/19/2012  . Bloating 06/19/2012  . Nausea alone 06/19/2012  . Bladder retention 04/24/2012  . Tremor 03/05/2012  . Dysuria 03/05/2012  . Failure to thrive in adult 06/21/2011  . Airway hyperreactivity 03/08/2011  . Back pain, chronic 03/08/2011  . Chronic obstructive pulmonary disease ([place]) 03/08/2011  . Acid reflux 03/08/2011  . Cystitis 01/22/2011  . Bilateral hand numbness 01/13/2011  . WEIGHT  LOSS 06/24/2010  . RIB PAIN, LEFT SIDED 05/04/2010  . ABDOMINAL PAIN, LEFT UPPER QUADRANT 05/04/2010  . Unspecified otitis media 10/26/2009  . BRONCHITIS, RECURRENT 08/23/2009  . URI, ACUTE 08/04/2009  . FATIGUE 07/06/2009  . IBS 03/11/2009  . ANAL FISSURE 03/11/2009  . RECTAL BLEEDING 03/11/2009  . Chronic interstitial cystitis 03/11/2009  . COLONIC POLYPS, HX OF 03/11/2009  . GERD 02/05/2009  . SMOKER 12/04/2008  . chronic asthma poorly controlled 12/04/2008  . 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 Bipolar disorder ([place]) 08/31/2007  . HYPERTENSION 08/31/2007  . EMPHYSEMA 08/31/2007  . 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 ARTHRITIS 08/31/2007  . FIBROMYALGIA 08/31/2007  . SLEEP APNEA 08/31/2007    Review of Neuroimaging and Relevant Medical History: :  The patient has a CT head from 12/19/2018 that I personally reviewed and it was normal.   Current Outpatient Medications  Medication Sig Dispense Refill  . acetaminophen-codeine (TYLENOL #4) 300-60 MG tablet Take 1 tablet by mouth every 4 (four) hours as needed for moderate pain.    Marland Kitchen albuterol (PROAIR HFA) 108 (90 Base) MCG/ACT inhaler INHALE 2 PUFFS EVERY 6 HOURS AS NEEDED FOR SHORTNESS OF BREATH AND WHEEZING. 8.5 g 1  . budesonide-formoterol (SYMBICORT) 80-4.5 MCG/ACT inhaler Inhale 2 puffs into the lungs 2 (two) times daily. 1 Inhaler 2  . clonazePAM (KLONOPIN) 0.5 MG tablet Take 1 tablet (0.5 mg total) by mouth 3 (three) times daily as needed for anxiety. 90 tablet 1  . fluticasone (FLONASE) 50 MCG/ACT nasal spray Place 2 sprays into both nostrils daily. (Patient taking differently: Place 2 sprays into both nostrils daily as needed for allergies. ) 16 g 6  .  gabapentin (NEURONTIN) 300 MG capsule Take 1 capsule (300 mg total) by mouth 2 (two) times daily. For agitation (Patient taking differently: Take 300 mg by mouth 3 (three) times daily. )    . hydrOXYzine (ATARAX/VISTARIL) 25 MG tablet  Take 25 mg by mouth at bedtime.     . lamoTRIgine (LAMICTAL) 25 MG tablet Take 25 mg by mouth at bedtime.     . mirtazapine (REMERON) 45 MG tablet Take 22.5 mg by mouth at bedtime.     Marland Kitchen oxybutynin (DITROPAN) 5 MG tablet Take 5 mg by mouth every 8 (eight) hours as needed for bladder spasms.    . pantoprazole (PROTONIX) 40 MG tablet Take 1 tablet (40 mg total) by mouth daily. (Patient not taking: Reported on 08/25/2019) 90 tablet 3  . potassium chloride 20 MEQ/15ML (10%) SOLN Take 20 mEq by mouth daily.     . promethazine (PHENERGAN) 25 MG tablet TAKE (1) TABLET EVERY SIX HOURS AS NEEDED FOR NAUSEA AND VOMITING. 12 tablet 0  . QUEtiapine (SEROQUEL) 400 MG tablet Take 400 mg by mouth at bedtime.     . rizatriptan (MAXALT-MLT) 10 MG disintegrating tablet Take 1 tablet earliest onset of migraine.  May repeat in 2 hours if needed.  Maximum 2 tablets in 24 hours (Patient not taking: Reported on 08/25/2019) 9 tablet 3  . topiramate (TOPAMAX) 50 MG tablet Take 1 tablet (50 mg total) by mouth 2 (two) times daily. 60 tablet 5   No current facility-administered medications for this visit.    Family History  Problem Relation Age of Onset  . Heart disease Father   . Asthma Maternal Grandmother   . Emphysema Maternal Grandfather   . Cancer Maternal Grandfather        Lung Cancer  . Cancer Other        Lung Cancer-Aunt  . Colon cancer Neg Hx   . Esophageal cancer Neg Hx   . Rectal cancer Neg Hx   . Stomach cancer Neg Hx   . Thyroid disease Neg Hx     There {psisisno:23650} family history of dementia. There {psisisno:23650} family history of psychiatric illness.  Psychosocial History  Developmental, Educational and Employment History: ***  Psychiatric History: ***  Substance Use History: ***  Relationship History and Living Cimcumstances: ***  Mental Status and Behavioral Observations  Sensorium/Arousal: The patient's level of arousal was awake and alert. Hearing and vision were *** for  testing purposes. Orientation: *** Appearance: *** Behavior: *** Speech/language: *** Gait/Posture: *** Movement: *** Social Comportment: *** Mood: *** Affect: *** Thought process/content: *** Safety: *** Insight: ***  Montreal Cognitive Assessment  ***/5 for visual/executive, ***/3 naming, ***/2 digits, ***/1 letter A, ***/3 serial sevens, ***/2 repetition, ***/1 fluency, ***/2 abstraction, ***/5 free delayed recall, ***/6 orientation, and ***/1 for education at or below high school graduate. MoCA total ***/30 (31 possible if at or below high school graduate)  Test Procedures  Wide Range Achievement Test - 4 Word Reading Wechsler Adult Intelligence Scale - IV  Digit Span  Arithmetic  Symbol Search  Coding  Information  Block Design Neuropsychological Assessment Battery  List Learning  Story Learning  Naming Repeatable Battery for the Assessment of Neuropsychological Status (Form A) ACS Word Choice The Dot Counting Test A Random Letter Test Controlled Oral Word Association (F-A-S) Semantic Fluency (Animals) Trail Making Test A & B Modified [place] Card Sorting Test Patient Health Questionnaire - 9 GAD-7 Geriatric Depression Scale - Short Form Quick Dementia Rating System  Plan  [name] was seen for a psychiatric diagnostic evaluation and neuropsychological testing. Full and complete note with impressions, recommendations, and interpretation of test data to follow.   Viviano Simas Nicole Kindred, PsyD, [place] Clinical Neuropsychologist  Informed Consent and Coding/Compliance  Risks and benefits of the evaluation were discussed with the patient prior to all testing procedures. I conducted a clinical interview {psqhptesting:23835} with Suzzanne R Gill and {pstechnician:23646} assisted me in administering additional test procedures. The patient was able to tolerate the testing procedures and the patient (and/or family if applicable) is likely to benefit from further follow up to  receive the diagnosis and treatment recommendations, which will be rendered at the next encounter. Billing below reflects technician time, my direct face-to-face time with the patient, time spent in test administration, and time spent in professional activities including but not limited to: neuropsychological test interpretation, integration of neuropsychological test data with clinical history, report preparation, treatment planning, care coordination, and review of diagnostically pertinent medical history or studies.   Services associated with this encounter: Clinical Interview [phone]) plus *** minutes [id number]; Neuropsychological Evaluation by Professional)  *** minutes [id number]; Neuropsychological Evaluation by Professional, Adl.) *** minutes [id number]; Test Administration by Professional) *** minutes [id number]; Test Administration by Professional, Adl.) *** minutes [id number]; Neuropsychological Testing by Technician) *** minutes [id number]; Neuropsychological Testing by Technician, Adl.)

## 2019-09-18 ENCOUNTER — Encounter: Payer: BC Managed Care – PPO | Admitting: Counselor

## 2019-09-20 ENCOUNTER — Other Ambulatory Visit: Payer: Self-pay | Admitting: Family Medicine

## 2019-09-20 DIAGNOSIS — K219 Gastro-esophageal reflux disease without esophagitis: Secondary | ICD-10-CM

## 2019-10-02 ENCOUNTER — Telehealth: Payer: Self-pay | Admitting: Medical

## 2019-10-02 ENCOUNTER — Ambulatory Visit: Payer: BC Managed Care – PPO | Admitting: Medical

## 2019-10-02 ENCOUNTER — Other Ambulatory Visit: Payer: Self-pay

## 2019-10-02 VITALS — BP 102/47 | HR 81 | Resp 18 | Ht 62.0 in | Wt 92.4 lb

## 2019-10-02 DIAGNOSIS — R748 Abnormal levels of other serum enzymes: Secondary | ICD-10-CM

## 2019-10-02 DIAGNOSIS — R634 Abnormal weight loss: Secondary | ICD-10-CM | POA: Diagnosis not present

## 2019-10-02 DIAGNOSIS — R112 Nausea with vomiting, unspecified: Secondary | ICD-10-CM

## 2019-10-02 DIAGNOSIS — R630 Anorexia: Secondary | ICD-10-CM

## 2019-10-02 DIAGNOSIS — R110 Nausea: Secondary | ICD-10-CM

## 2019-10-02 DIAGNOSIS — F419 Anxiety disorder, unspecified: Secondary | ICD-10-CM

## 2019-10-02 DIAGNOSIS — E876 Hypokalemia: Secondary | ICD-10-CM

## 2019-10-02 DIAGNOSIS — R109 Unspecified abdominal pain: Secondary | ICD-10-CM

## 2019-10-02 DIAGNOSIS — R300 Dysuria: Secondary | ICD-10-CM

## 2019-10-02 LAB — POC URINALSYSI DIPSTICK (AUTOMATED)
Bilirubin, UA: POSITIVE
Blood, UA: NEGATIVE
Glucose, UA: NEGATIVE
Ketones, UA: NEGATIVE
Leukocytes, UA: NEGATIVE
Nitrite, UA: NEGATIVE
Protein, UA: POSITIVE — AB
Spec Grav, UA: 1.025 (ref 1.010–1.025)
Urobilinogen, UA: 0.2 E.U./dL
pH, UA: 6 (ref 5.0–8.0)

## 2019-10-02 LAB — COMPREHENSIVE METABOLIC PANEL
ALT: 16 U/L (ref 0–35)
AST: 18 U/L (ref 0–37)
Albumin: 3.9 g/dL (ref 3.5–5.2)
Alkaline Phosphatase: 96 U/L (ref 39–117)
BUN: 9 mg/dL (ref 6–23)
CO2: 30 mEq/L (ref 19–32)
Calcium: 9.2 mg/dL (ref 8.4–10.5)
Chloride: 105 mEq/L (ref 96–112)
Creatinine, Ser: 0.68 mg/dL (ref 0.40–1.20)
GFR: 94.24 mL/min (ref >60.00)
Glucose, Bld: 93 mg/dL (ref 70–99)
Potassium: 4 mEq/L (ref 3.5–5.1)
Sodium: 139 mEq/L (ref 135–145)
Total Bilirubin: 0.2 mg/dL (ref 0.2–1.2)
Total Protein: 6.6 g/dL (ref 6.0–8.3)

## 2019-10-02 LAB — CBC WITH DIFFERENTIAL/PLATELET
Basophils Absolute: 0.1 10*3/uL (ref 0.0–0.1)
Basophils Relative: 0.8 % (ref 0.0–3.0)
Eosinophils Absolute: 0.5 10*3/uL (ref 0.0–0.7)
Eosinophils Relative: 6.6 % — ABNORMAL HIGH (ref 0.0–5.0)
HCT: 36.1 % (ref 36.0–46.0)
Hemoglobin: 12.1 g/dL (ref 12.0–15.0)
Lymphocytes Relative: 41.2 % (ref 12.0–46.0)
Lymphs Abs: 3.4 10*3/uL (ref 0.7–4.0)
MCHC: 33.5 g/dL (ref 30.0–36.0)
MCV: 94.8 fl (ref 78.0–100.0)
Monocytes Absolute: 0.5 10*3/uL (ref 0.1–1.0)
Monocytes Relative: 6.3 % (ref 3.0–12.0)
Neutro Abs: 3.7 10*3/uL (ref 1.4–7.7)
Neutrophils Relative %: 45.1 % (ref 43.0–77.0)
Platelets: 299 10*3/uL (ref 150.0–400.0)
RBC: 3.8 Mil/uL — ABNORMAL LOW (ref 3.87–5.11)
RDW: 14.7 % (ref 11.5–15.5)
WBC: 8.2 10*3/uL (ref 4.0–10.5)

## 2019-10-02 LAB — LIPASE: Lipase: 87 U/L — ABNORMAL HIGH (ref 11.0–59.0)

## 2019-10-02 LAB — H. PYLORI ANTIBODY, IGG: H Pylori IgG: NEGATIVE

## 2019-10-02 NOTE — Telephone Encounter (Addendum)
Will you see referral to gi I placed. Update me by early next week.  Pt number [phone]. Daughter may pick up.

## 2019-10-02 NOTE — Telephone Encounter (Signed)
Future lipase placed.

## 2019-10-02 NOTE — Progress Notes (Signed)
Subjective:    Patient ID: [name], female    DOB: 07/12/1975, 44 y.o.   MRN: [id number]  HPI  Pt has hx of weight loss. Today weight 92 lbs.  Last visit hpi.  "Pt is in for follow up from the ED. She was seen by provider on 5-02-10-2020 who advised be seen in ED. So she went the next day.  [name] a 44 y.o.femalewho presents emergency department for vomiting and abdominal pain. She has a past medical history of anxiety, depression, arthritis, chronic interstitial cystitis, chronic abdominal pain, history of cervical cancer, fibromyalgia, endometriosis, COPD history of intractable vomiting . She states that for the past month she has had vomiting every time she eats. She has had about a 10 to 15 pound weight loss per the patient. She states that every time she tries to put something into her body she vomits it out including liquids. She complains of a migraine headache which she thinks may be due to dehydration. Her abdominal pain has not gotten any worse.  I had ordered stat labs that day and were not done by pt. I think lab was closed. Staff tried to reach out to ask pt to come in for labs next day."  A/P last exam. "Sorry to see that you continue to have daily nausea, vomiting and persistent weight loss over the last 2 years.  Recent work-up in the emergency department was negative.  You report they gave you IV hydration and antibiotics for UTI.  We will repeat labs today to make sure no acute abnormality.  Want to make sure that you are electrolytes look stable that you are not dehydrated.  I will get urine culture today since you report urinary infection but I do not see that a culture was done through the ED.  Continue Keflex.  If you have worsening or changing signs symptoms have to recommend to be seen again at the emergency department.  Will prescribe phenergan for nausea or vomiting.  I will send copy of this note to your PCP.  Requested you follow-up  with PCP in 7 to 10 days."  I also ordered ct abdomen pelvis last visit. Looks like that was never done.  Pt can't remember why she did not get labs. She mention some family stress. I had ordered ct abdomen/pelvis.    Today pt states nausea and diarrhea did resolved. But still having some abdomen pain daily as before. Not worse.  Pt mentions had video call with Dr .Etter Sjogren. Pt was dehydrated and got fluids.  Pt tried to have liquid diet but states still can't eat daily. Pt states some days won't eat for 2-3 days.  The will be able to eat moderate amount for 2-3 days. Pt does states she has pain after she eats if she eats. When she hungry she eats very fast.    Pt states that psychiatrist that is seeing her has not mentioned anorexia or bulemia. Pt tells me she knows she is very thin.   Pt states she saw GI MD dr. Loraine Leriche in the past. Pt stated EGD but he is now retired.   Pt had been referred to GI in January.   Referral note. "Called both phone numbers listed for patient no answer and voicemail not available will try again later. "  Hx of low k. Pt states can't tolerate oral k. She states will make her vomit.         Review of Systems  Constitutional: Positive for unexpected weight change. Negative for chills, diaphoresis and fatigue.  Respiratory: Negative for cough, chest tightness, shortness of breath and wheezing.   Cardiovascular: Negative for chest pain and palpitations.  Gastrointestinal: Positive for abdominal pain. Negative for abdominal distention, blood in stool, constipation, nausea and rectal pain.  Musculoskeletal: Negative for arthralgias and gait problem.  Skin: Negative for rash.  Neurological: Negative for dizziness, speech difficulty, weakness and light-headedness.  Hematological: Negative for adenopathy. Does not bruise/bleed easily.  Psychiatric/Behavioral: Negative for behavioral problems, decreased concentration and suicidal ideas. The patient is not  nervous/anxious.        Expresses stress.    Past Medical History:  Diagnosis Date  . 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 Anal fissure 03/11/2009  . Anemia   . Anxiety   . Anxiety and depression   . ARTHRITIS 08/31/2007  . Arthritis   . Asthma   . 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 Bipolar 1 disorder ([place])   . Bowel obstruction ([place])   . Breakdown of urinary electronic stimulator device, init ([place])   . BRONCHITIS, RECURRENT 08/23/2009   Asthmatic Bronchitis-Dr. Melvyn Novas.....-HFA 75% 12/04/2008>75% 02/05/2009>75% 08/04/2009 -PFT's 01/04/2009 2.56 (86%) ratio 75, no resp to B2 and DLC0 67% > 80 after correction   . Cancer (HCC)    cervical cancer  . Chronic interstitial cystitis 03/11/2009  . Chronic nausea   . Chronic pain   . Colon polyps   . COLONIC POLYPS, HX OF 07/25/2006   ADENOMATOUS POLYP  . COPD (chronic obstructive pulmonary disease) ([place])   . DEPRESSION 08/31/2007  . Endometriosis   . FIBROMYALGIA 08/31/2007  . Fibromyalgia   . GERD 02/05/2009  . Hyperlipidemia   . HYPERTENSION 08/31/2007  . IBS 03/11/2009  . Internal hemorrhoids   . Migraine headache   . 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 PONV (postoperative nausea and vomiting)   . RECTAL BLEEDING 03/11/2009  . Seizures ([place])    been about 1 year since last seisure per pt  . SLEEP APNEA 08/31/2007  . Substance abuse ([place])   . Thyroid disease   . Uterine cyst      Social History   Socioeconomic History  . Marital status: Married    Spouse name: Not on file  . Number of children: 2  . Years of education: Not on file  . Highest education level: Not on file  Occupational History    Employer: UNEMPLOYED  Tobacco Use  . Smoking status: Current Every Day Smoker    Packs/day: 0.50    Years: 14.00    Pack years: 7.00    Types: Cigarettes  . Smokeless tobacco: Never Used  Vaping Use  . Vaping Use: Never used  Substance and Sexual Activity  . Alcohol use: Not Currently    Comment: last drink 3 weeks ago;  recently released from rehab  . Drug use: Not Currently    Types: Marijuana    Comment: once a month  . Sexual activity: Not on file  Other Topics Concern  . Not on file  Social History Narrative   Homemaker   Daily Caffeine Use-Mtn. Dew         Social Determinants of Health   Financial Resource Strain:   . Difficulty of Paying Living Expenses:   Food Insecurity:   . Worried About Charity fundraiser in the Last Year:   . Arboriculturist in the Last Year:   Transportation Needs:   . Film/video editor (Medical):   Marland Kitchen  Lack of Transportation (Non-Medical):   Physical Activity:   . Days of Exercise per Week:   . Minutes of Exercise per Session:   Stress:   . Feeling of Stress :   Social Connections:   . Frequency of Communication with Friends and Family:   . Frequency of Social Gatherings with Friends and Family:   . Attends Religious Services:   . Active Member of Clubs or Organizations:   . Attends Archivist Meetings:   Marland Kitchen Marital Status:   Intimate Partner Violence:   . Fear of Current or Ex-Partner:   . Emotionally Abused:   Marland Kitchen Physically Abused:   . Sexually Abused:     Past Surgical History:  Procedure Laterality Date  . ABDOMINAL HYSTERECTOMY    . BIOPSY  10/25/2018   Procedure: BIOPSY;  Surgeon: Rogene Houston, MD;  Location: [facility];  Service: Endoscopy;;  gastric  . bladder stretching x6    . BLADDER SURGERY     stimulator placed and stretching   . CHOLECYSTECTOMY    . COLONOSCOPY    . COLONOSCOPY WITH PROPOFOL N/A 09/03/2014   Procedure: COLONOSCOPY WITH PROPOFOL;  Surgeon: Milus Banister, MD;  Location: [facility];  Service: Endoscopy;  Laterality: N/A;  . ESOPHAGOGASTRODUODENOSCOPY (EGD) WITH PROPOFOL N/A 10/25/2018   Procedure: ESOPHAGOGASTRODUODENOSCOPY (EGD) WITH PROPOFOL;  Surgeon: Rogene Houston, MD;  Location: [facility];  Service: Endoscopy;  Laterality: N/A;  11:15  . interstitial cystitis    . PACEMAKER INSERTION       in hip for interstitial cystitis  . removal of uterine cyst and scrapped uterus    . replaced bladder pacemaker      Family History  Problem Relation Age of Onset  . Heart disease Father   . Asthma Maternal Grandmother   . Emphysema Maternal Grandfather   . Cancer Maternal Grandfather        Lung Cancer  . Cancer Other        Lung Cancer-Aunt  . Colon cancer Neg Hx   . Esophageal cancer Neg Hx   . Rectal cancer Neg Hx   . Stomach cancer Neg Hx   . Thyroid disease Neg Hx     Allergies  Allergen Reactions  . Abilify [Aripiprazole] Swelling, Palpitations and Other (See Comments)    Throat swelling, tremors   . Amitriptyline Anaphylaxis    Angioedema  . Metoclopramide Hcl Other (See Comments)    Causes seizures  . Propoxyphene Rash  . Tramadol Swelling, Other (See Comments) and Rash    Throat swelling, tremors  . Ambien [Zolpidem Tartrate] Other (See Comments)    hallucinations  . Eszopiclone Other (See Comments)    Hallucinations, hyperactivity, and bad taste in mouth   . Codeine Other (See Comments)    Tylenol #3 caused itching  . Varenicline Other (See Comments)    Suicidal thoughts  . Buprenorphine Hcl Itching and Hives  . Demerol [Meperidine] Rash  . Emetrol Hives, Itching and Rash  . Morphine And Related Hives and Itching    Current Outpatient Medications on File Prior to Visit  Medication Sig Dispense Refill  . acetaminophen-codeine (TYLENOL #4) 300-60 MG tablet Take 1 tablet by mouth every 4 (four) hours as needed for moderate pain.    Marland Kitchen albuterol (PROAIR HFA) 108 (90 Base) MCG/ACT inhaler INHALE 2 PUFFS EVERY 6 HOURS AS NEEDED FOR SHORTNESS OF BREATH AND WHEEZING. 8.5 g 1  . budesonide-formoterol (SYMBICORT) 80-4.5 MCG/ACT inhaler Inhale 2 puffs  into the lungs 2 (two) times daily. 1 Inhaler 2  . clonazePAM (KLONOPIN) 0.5 MG tablet Take 1 tablet (0.5 mg total) by mouth 3 (three) times daily as needed for anxiety. 90 tablet 1  . fluticasone (FLONASE) 50  MCG/ACT nasal spray Place 2 sprays into both nostrils daily. (Patient taking differently: Place 2 sprays into both nostrils daily as needed for allergies. ) 16 g 6  . gabapentin (NEURONTIN) 300 MG capsule Take 1 capsule (300 mg total) by mouth 2 (two) times daily. For agitation (Patient taking differently: Take 300 mg by mouth 3 (three) times daily. )    . hydrOXYzine (ATARAX/VISTARIL) 25 MG tablet Take 25 mg by mouth at bedtime.     . lamoTRIgine (LAMICTAL) 25 MG tablet Take 25 mg by mouth at bedtime.     . mirtazapine (REMERON) 45 MG tablet Take 22.5 mg by mouth at bedtime.     Marland Kitchen oxybutynin (DITROPAN) 5 MG tablet Take 5 mg by mouth every 8 (eight) hours as needed for bladder spasms.    . pantoprazole (PROTONIX) 40 MG tablet Take 1 tablet (40 mg total) by mouth daily. 90 tablet 3  . potassium chloride 20 MEQ/15ML (10%) SOLN Take 20 mEq by mouth daily.     . promethazine (PHENERGAN) 25 MG tablet TAKE (1) TABLET EVERY SIX HOURS AS NEEDED FOR NAUSEA AND VOMITING. 12 tablet 0  . QUEtiapine (SEROQUEL) 400 MG tablet Take 400 mg by mouth at bedtime.     . rizatriptan (MAXALT-MLT) 10 MG disintegrating tablet Take 1 tablet earliest onset of migraine.  May repeat in 2 hours if needed.  Maximum 2 tablets in 24 hours 9 tablet 3  . topiramate (TOPAMAX) 50 MG tablet Take 1 tablet (50 mg total) by mouth 2 (two) times daily. 60 tablet 5   No current facility-administered medications on file prior to visit.    BP (!) 102/47 (BP Location: Left Arm, Patient Position: Sitting, Cuff Size: Normal)   Pulse 81   Resp 18   Ht 5\' 2"  (1.575 m)   Wt 92 lb 6.4 oz (41.9 kg)   SpO2 100%   BMI 16.90 kg/m       Objective:   Physical Exam  General- No acute distress. Pleasant patient. Appears teary eyed today about some stressful life situation. Does see psychiatrist regularly. Neck- Full range of motion, no jvd Lungs- Clear, even and unlabored. Heart- regular rate and rhythm. Neurologic- CNII- XII grossly  intact. Back- no cva tenderness. Abdomen- I barely palpated her abdomen, +bs, soft, no masses.(unclear if severe abdomen pain as during interview does not seem to be in severe pain?)      Assessment & Plan:  You do have history of poor appetite, weight loss, abdomen pain, nausea, and a low potassium.  I did find CT abdomen/pelvis with contrast done in September 2020.  No significant findings found.  Will refer you to GI.  Hopefully we can get you in with the [facility] office location.  Try tum to use as needed for epigastric pain.  You have Phenergan for nausea.  Do recommend eating bland foods.  Also you could try boost.  For anxiety, continue seeing your current psychiatrist.  I will try to send note to your PCP to see how she feels about trying to get you in with new psychiatrist.  Since you did mention there are some issues that you would feel more comfortable talking about if you have female psychiatrist.  For recent dysuria,  the urine looked clear but will send out urine culture.  You have history of IC so we will wait and see what your urine culture shows before prescribing antibiotic.  Follow-up in 10 to 14 days with PCP or as needed.  Mackie Pai, PA-C   Time spent with patient today was 40+  minutes which consisted of chart revdiew, discussing diagnosis, work up treatment and documentation.

## 2019-10-02 NOTE — Patient Instructions (Signed)
You do have history of poor appetite, weight loss, abdomen pain, nausea, and a low potassium.  I did find CT abdomen/pelvis with contrast done in September 2020.  No significant findings found.  Will refer you to GI.  Hopefully we can get you in with the [place] [facility] office location.  Try tum to use as needed for epigastric pain.  You have Phenergan for nausea.  Do recommend eating bland foods.  Also you could try boost.  For anxiety, continue seeing your current psychiatrist.  I will try to send note to your PCP to see how she feels about trying to get you in with new psychiatrist.  Since you did mention there are some issues that you would feel more comfortable talking about if you have female psychiatrist.  For recent dysuria, the urine looked clear but will send out urine culture.  You have history of IC so we will wait and see what your urine culture shows before prescribing antibiotic.  Follow-up in 10 to 14 days with PCP or as needed.

## 2019-10-02 NOTE — Telephone Encounter (Signed)
You can cancel the ct. I did find 12-2018 ct report. Her former gi md is Dr. Laural Golden in [place]. See if he is still working. Or office he was working at still open. If so refer to them since they have seen.

## 2019-10-03 LAB — URINE CULTURE
MICRO NUMBER:: 10630063
Result:: NO GROWTH
SPECIMEN QUALITY:: ADEQUATE

## 2019-10-08 NOTE — Telephone Encounter (Signed)
GI has tried to contact pt 2x but the phone # was not going thru. I called the pts daughter and pt has updated temporary #. I have sent msg to GI to contact pt again for scheduling.

## 2019-10-24 ENCOUNTER — Encounter: Payer: Self-pay | Admitting: Family Medicine

## 2019-10-24 ENCOUNTER — Other Ambulatory Visit: Payer: Self-pay

## 2019-10-24 ENCOUNTER — Telehealth (INDEPENDENT_AMBULATORY_CARE_PROVIDER_SITE_OTHER): Payer: BC Managed Care – PPO | Admitting: Family Medicine

## 2019-10-24 VITALS — Ht 62.0 in | Wt <= 1120 oz

## 2019-10-24 DIAGNOSIS — J209 Acute bronchitis, unspecified: Secondary | ICD-10-CM

## 2019-10-24 DIAGNOSIS — J440 Chronic obstructive pulmonary disease with acute lower respiratory infection: Secondary | ICD-10-CM

## 2019-10-24 MED ORDER — AZITHROMYCIN 250 MG PO TABS: ORAL_TABLET | ORAL | 0 refills | Status: DC

## 2019-10-24 MED ORDER — PROMETHAZINE-DM 6.25-15 MG/5ML PO SYRP: 5.0000 mL | ORAL_SOLUTION | Freq: Four times a day (QID) | ORAL | 0 refills | Status: DC | PRN

## 2019-10-24 NOTE — Progress Notes (Signed)
Virtual Visit via Video Note  I connected with [name] on 10/24/19 at  4:00 PM EDT by a video enabled telemedicine application and verified that I am speaking with the correct person using two identifiers.  Location: Patient: driving   Provider: office    I discussed the limitations of evaluation and management by telemedicine and the availability of in person appointments. The patient expressed understanding and agreed to proceed.  History of Present Illness: Pt is in her car c/o cough and congestion x 3 months    Pt is using her inhalers    Observations/Objective: There were no vitals filed for this visit. No fever per pt  Pt is in NAD  Assessment and Plan: 1. Acute bronchitis with COPD ([place]) abx and cough med per orders Xray next week if no better  - azithromycin (ZITHROMAX Z-PAK) 250 MG tablet; As directed  Dispense: 6 each; Refill: 0 - promethazine-dextromethorphan (PROMETHAZINE-DM) 6.25-15 MG/5ML syrup; Take 5 mLs by mouth 4 (four) times daily as needed.  Dispense: 118 mL; Refill: 0 - DG Chest 2 View; Future   Follow Up Instructions:    I discussed the assessment and treatment plan with the patient. The patient was provided an opportunity to ask questions and all were answered. The patient agreed with the plan and demonstrated an understanding of the instructions.   The patient was advised to call back or seek an in-person evaluation if the symptoms worsen or if the condition fails to improve as anticipated.  I provided 30 minutes of non-face-to-face time during this encounter.   [name], DO

## 2019-11-04 DIAGNOSIS — N3010 Interstitial cystitis (chronic) without hematuria: Secondary | ICD-10-CM | POA: Diagnosis not present

## 2019-11-04 DIAGNOSIS — Z9682 Presence of neurostimulator: Secondary | ICD-10-CM | POA: Diagnosis not present

## 2019-11-07 DIAGNOSIS — Z20828 Contact with and (suspected) exposure to other viral communicable diseases: Secondary | ICD-10-CM | POA: Diagnosis not present

## 2019-11-14 DIAGNOSIS — R350 Frequency of micturition: Secondary | ICD-10-CM | POA: Diagnosis not present

## 2019-11-14 DIAGNOSIS — T83190A Other mechanical complication of urinary electronic stimulator device, initial encounter: Secondary | ICD-10-CM | POA: Diagnosis not present

## 2019-11-14 DIAGNOSIS — T8384XA Pain from genitourinary prosthetic devices, implants and grafts, initial encounter: Secondary | ICD-10-CM | POA: Diagnosis not present

## 2019-11-14 DIAGNOSIS — N3010 Interstitial cystitis (chronic) without hematuria: Secondary | ICD-10-CM | POA: Diagnosis not present

## 2019-11-14 DIAGNOSIS — R634 Abnormal weight loss: Secondary | ICD-10-CM | POA: Diagnosis not present

## 2019-11-14 DIAGNOSIS — T85848A Pain due to other internal prosthetic devices, implants and grafts, initial encounter: Secondary | ICD-10-CM | POA: Diagnosis not present

## 2019-11-14 DIAGNOSIS — Y838 Other surgical procedures as the cause of abnormal reaction of the patient, or of later complication, without mention of misadventure at the time of the procedure: Secondary | ICD-10-CM | POA: Diagnosis not present

## 2019-11-14 DIAGNOSIS — Z4542 Encounter for adjustment and management of neuropacemaker (brain) (peripheral nerve) (spinal cord): Secondary | ICD-10-CM | POA: Diagnosis not present

## 2019-11-14 DIAGNOSIS — R3915 Urgency of urination: Secondary | ICD-10-CM | POA: Diagnosis not present

## 2019-11-14 DIAGNOSIS — Z681 Body mass index (BMI) 19 or less, adult: Secondary | ICD-10-CM | POA: Diagnosis not present

## 2019-11-27 DIAGNOSIS — R350 Frequency of micturition: Secondary | ICD-10-CM | POA: Diagnosis not present

## 2019-11-27 DIAGNOSIS — N3010 Interstitial cystitis (chronic) without hematuria: Secondary | ICD-10-CM | POA: Diagnosis not present

## 2019-11-27 DIAGNOSIS — R339 Retention of urine, unspecified: Secondary | ICD-10-CM | POA: Diagnosis not present

## 2019-11-27 DIAGNOSIS — R3915 Urgency of urination: Secondary | ICD-10-CM | POA: Diagnosis not present

## 2019-11-27 DIAGNOSIS — Z09 Encounter for follow-up examination after completed treatment for conditions other than malignant neoplasm: Secondary | ICD-10-CM | POA: Diagnosis not present

## 2019-12-04 ENCOUNTER — Encounter: Payer: Self-pay | Admitting: Medical

## 2019-12-11 ENCOUNTER — Other Ambulatory Visit: Payer: Self-pay | Admitting: Family Medicine

## 2019-12-22 ENCOUNTER — Ambulatory Visit: Payer: BC Managed Care – PPO | Admitting: Medical

## 2019-12-22 DIAGNOSIS — Z0289 Encounter for other administrative examinations: Secondary | ICD-10-CM

## 2019-12-26 DIAGNOSIS — R339 Retention of urine, unspecified: Secondary | ICD-10-CM | POA: Diagnosis not present

## 2019-12-31 DIAGNOSIS — F411 Generalized anxiety disorder: Secondary | ICD-10-CM | POA: Diagnosis not present

## 2019-12-31 DIAGNOSIS — F1011 Alcohol abuse, in remission: Secondary | ICD-10-CM | POA: Diagnosis not present

## 2019-12-31 DIAGNOSIS — F319 Bipolar disorder, unspecified: Secondary | ICD-10-CM | POA: Diagnosis not present

## 2019-12-31 DIAGNOSIS — Z79899 Other long term (current) drug therapy: Secondary | ICD-10-CM | POA: Diagnosis not present

## 2020-01-14 ENCOUNTER — Ambulatory Visit: Payer: BC Managed Care – PPO | Admitting: Family Medicine

## 2020-01-15 ENCOUNTER — Other Ambulatory Visit: Payer: Self-pay

## 2020-01-15 ENCOUNTER — Encounter: Payer: Self-pay | Admitting: Family Medicine

## 2020-01-15 ENCOUNTER — Ambulatory Visit (HOSPITAL_BASED_OUTPATIENT_CLINIC_OR_DEPARTMENT_OTHER)
Admission: RE | Admit: 2020-01-15 | Discharge: 2020-01-15 | Disposition: A | Discharge status: Home / Self Care | Payer: BC Managed Care – PPO | Source: Ambulatory Visit | Attending: Family Medicine | Admitting: Family Medicine

## 2020-01-15 ENCOUNTER — Ambulatory Visit: Payer: BC Managed Care – PPO | Admitting: Family Medicine

## 2020-01-15 VITALS — BP 96/66 | HR 99 | Temp 98.4°F | Resp 12 | Ht 62.0 in | Wt 86.0 lb

## 2020-01-15 DIAGNOSIS — R110 Nausea: Secondary | ICD-10-CM | POA: Diagnosis not present

## 2020-01-15 DIAGNOSIS — E43 Unspecified severe protein-calorie malnutrition: Secondary | ICD-10-CM | POA: Diagnosis not present

## 2020-01-15 DIAGNOSIS — R0781 Pleurodynia: Secondary | ICD-10-CM | POA: Insufficient documentation

## 2020-01-15 DIAGNOSIS — R748 Abnormal levels of other serum enzymes: Secondary | ICD-10-CM | POA: Diagnosis not present

## 2020-01-15 DIAGNOSIS — R1013 Epigastric pain: Secondary | ICD-10-CM | POA: Diagnosis not present

## 2020-01-15 DIAGNOSIS — S2241XA Multiple fractures of ribs, right side, initial encounter for closed fracture: Secondary | ICD-10-CM | POA: Diagnosis not present

## 2020-01-15 MED ORDER — PROMETHAZINE HCL 25 MG PO TABS: ORAL_TABLET | ORAL | 0 refills | Status: DC

## 2020-01-15 MED ORDER — HYDROCODONE-ACETAMINOPHEN 5-325 MG PO TABS: 1.0000 | ORAL_TABLET | Freq: Four times a day (QID) | ORAL | 0 refills | Status: AC | PRN

## 2020-01-15 NOTE — Patient Instructions (Signed)
Pancreatitis Eating Plan Pancreatitis is when your pancreas becomes irritated and swollen (inflamed). The pancreas is a small organ located behind your stomach. It helps your body digest food and regulate your blood sugar. Pancreatitis can affect how your body digests food, especially foods with fat. You may also have other symptoms such as abdominal pain or nausea. When you have pancreatitis, following a low-fat eating plan may help you manage symptoms and recover more quickly. Work with your health care provider or a diet and nutrition specialist (dietitian) to create an eating plan that is right for you. What are tips for following this plan? Reading food labels Use the information on food labels to help keep track of how much fat you eat:  Check the serving size.  Look for the amount of total fat in grams (g) in one serving. ? Low-fat foods have 3 g of fat or less per serving. ? Fat-free foods have 0.5 g of fat or less per serving.  Keep track of how much fat you eat based on how many servings you eat. ? For example, if you eat two servings, the amount of fat you eat will be two times what is listed on the label. Shopping   Buy low-fat or nonfat foods, such as: ? Fresh, frozen, or canned fruits and vegetables. ? Grains, including pasta, bread, and rice. ? Lean meat, poultry, fish, and other protein foods. ? Low-fat or nonfat dairy.  Avoid buying bakery products and other sweets made with whole milk, butter, and eggs.  Avoid buying snack foods with added fat, such as anything with butter or cheese flavoring. Cooking  Remove skin from poultry, and remove extra fat from meat.  Limit the amount of fat and oil you use to 6 teaspoons or less per day.  Cook using low-fat methods, such as boiling, broiling, grilling, steaming, or baking.  Use spray oil to cook. Add fat-free chicken broth to add flavor and moisture.  Avoid adding cream to thicken soups or sauces. Use other  thickeners such as corn starch or tomato paste. Meal planning   Eat a low-fat diet as told by your dietitian. For most people, this means having no more than 55-65 grams of fat each day.  Eat small, frequent meals throughout the day. For example, you may have 5-6 small meals instead of 3 large meals.  Drink enough fluid to keep your urine pale yellow.  Do not drink alcohol. Talk to your health care provider if you need help stopping.  Limit how much caffeine you have, including black coffee, black and green tea, caffeinated soft drinks, and energy drinks. General information  Let your health care provider or dietitian know if you have unplanned weight loss on this eating plan.  You may be instructed to follow a clear liquid diet during a flare of symptoms. Talk with your health care provider about how to manage your diet during symptoms of a flare.  Take any vitamins or supplements as told by your health care provider.  Work with a dietitian, especially if you have other conditions such as obesity or diabetes mellitus. What foods should I avoid? Fruits Fried fruits. Fruits served with butter or cream. Vegetables Fried vegetables. Vegetables cooked with butter, cheese, or cream. Grains Biscuits, waffles, donuts, pastries, and croissants. Pies and cookies. Butter-flavored popcorn. Regular crackers. Meats and other protein foods Fatty cuts of meat. Poultry with skin. Organ meats. Bacon, sausage, and cold cuts. Whole eggs. Nuts and nut butters. Dairy Whole and   2% milk. Whole milk yogurt. Whole milk ice cream. Cream and half-and-half. Cream cheese. Sour cream. Cheese. Beverages Wine, beer, and liquor. The items listed above may not be a complete list of foods and beverages to avoid. Contact a dietitian for more information. Summary  Pancreatitis can affect how your body digests food, especially foods with fat.  When you have pancreatitis, it is recommended that you follow a low-fat  eating plan to help you recover more quickly and manage symptoms. For most people, this means limiting fat to no more than 55-65 grams per day.  Do not drink alcohol. Limit the amount of caffeine you have, and drink enough fluid to keep your urine pale yellow. This information is not intended to replace advice given to you by your health care provider. Make sure you discuss any questions you have with your health care provider. Document Revised: 07/18/2018 Document Reviewed: 07/03/2017 Elsevier Patient Education  2020 Elsevier Inc.  

## 2020-01-15 NOTE — Progress Notes (Signed)
Patient ID: [name], female    DOB: [birth year]  Age: 44 y.o. MRN: [id number]    Subjective:  Subjective  HPI [name] presents for R rib pain after falling over toddler bed    She also c/o abd pain and needs f/u lipase and needs help with eating--- she con't to lose weight and states she can not eat   Review of Systems  Constitutional: Negative for appetite change, diaphoresis, fatigue and unexpected weight change.  Eyes: Negative for pain, redness and visual disturbance.  Respiratory: Negative for cough, chest tightness, shortness of breath and wheezing.   Cardiovascular: Negative for chest pain, palpitations and leg swelling.  Gastrointestinal: Positive for abdominal pain and nausea. Negative for vomiting.  Endocrine: Negative for cold intolerance, heat intolerance, polydipsia, polyphagia and polyuria.  Genitourinary: Negative for difficulty urinating, dysuria and frequency.  Neurological: Negative for dizziness, light-headedness, numbness and headaches.    History Past Medical History:  Diagnosis Date  . 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 Anal fissure 03/11/2009  . Anemia   . Anxiety   . Anxiety and depression   . ARTHRITIS 08/31/2007  . Arthritis   . Asthma   . 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 Bipolar 1 disorder ([place])   . Bowel obstruction ([place])   . Breakdown of urinary electronic stimulator device, init ([place])   . BRONCHITIS, RECURRENT 08/23/2009   Asthmatic Bronchitis-Dr. Melvyn Novas.....-HFA 75% 12/04/2008>75% 02/05/2009>75% 08/04/2009 -PFT's 01/04/2009 2.56 (86%) ratio 75, no resp to B2 and DLC0 67% > 80 after correction   . Cancer (HCC)    cervical cancer  . Chronic interstitial cystitis 03/11/2009  . Chronic nausea   . Chronic pain   . Colon polyps   . COLONIC POLYPS, HX OF 07/25/2006   ADENOMATOUS POLYP  . COPD (chronic obstructive pulmonary disease) ([place])   . DEPRESSION 08/31/2007  . Endometriosis   . FIBROMYALGIA 08/31/2007  . Fibromyalgia   . GERD  02/05/2009  . Hyperlipidemia   . HYPERTENSION 08/31/2007  . IBS 03/11/2009  . Internal hemorrhoids   . Migraine headache   . 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 PONV (postoperative nausea and vomiting)   . RECTAL BLEEDING 03/11/2009  . Seizures ([place])    been about 1 year since last seisure per pt  . SLEEP APNEA 08/31/2007  . Substance abuse ([place])   . Thyroid disease   . Uterine cyst     She has a past surgical history that includes Cholecystectomy; Abdominal hysterectomy; Pacemaker insertion; Bladder surgery; interstitial cystitis; bladder stretching x6; replaced bladder pacemaker; removal of uterine cyst and scrapped uterus; Colonoscopy; Colonoscopy with propofol (N/A, 09/03/2014); Esophagogastroduodenoscopy (egd) with propofol (N/A, 10/25/2018); and biopsy (10/25/2018).   Her family history includes Asthma in her maternal grandmother; Cancer in her maternal grandfather and another family member; Emphysema in her maternal grandfather; Heart disease in her father.She reports that she has been smoking cigarettes. She has a 7.00 pack-year smoking history. She has never used smokeless tobacco. She reports previous alcohol use. She reports previous drug use. Drug: Marijuana.  Current Outpatient Medications on File Prior to Visit  Medication Sig Dispense Refill  . acetaminophen-codeine (TYLENOL #4) 300-60 MG tablet Take 1 tablet by mouth every 4 (four) hours as needed for moderate pain.    Marland Kitchen albuterol (PROAIR HFA) 108 (90 Base) MCG/ACT inhaler INHALE 2 PUFFS EVERY 6 HOURS AS NEEDED FOR SHORTNESS OF BREATH AND WHEEZING. 8.5 g 1  . budesonide-formoterol (SYMBICORT) 80-4.5 MCG/ACT inhaler Inhale 2 puffs into the lungs 2 (  two) times daily. 1 Inhaler 2  . clonazePAM (KLONOPIN) 0.5 MG tablet Take 1 tablet (0.5 mg total) by mouth 3 (three) times daily as needed for anxiety. 90 tablet 1  . fluticasone (FLONASE) 50 MCG/ACT nasal spray Place 2 sprays into both nostrils daily. (Patient taking differently: Place 2  sprays into both nostrils daily as needed for allergies. ) 16 g 6  . gabapentin (NEURONTIN) 300 MG capsule Take 1 capsule (300 mg total) by mouth 2 (two) times daily. For agitation (Patient taking differently: Take 300 mg by mouth 3 (three) times daily. )    . hydrOXYzine (ATARAX/VISTARIL) 25 MG tablet Take 25 mg by mouth at bedtime.     . lamoTRIgine (LAMICTAL) 25 MG tablet Take 25 mg by mouth at bedtime.     . mirtazapine (REMERON) 45 MG tablet Take 22.5 mg by mouth at bedtime.     Marland Kitchen omeprazole (PRILOSEC) 40 MG capsule TAKE 1 CAPSULE BY MOUTH ONCE DAILY. 30 capsule 0  . oxybutynin (DITROPAN) 5 MG tablet Take 5 mg by mouth every 8 (eight) hours as needed for bladder spasms.    . Potassium Chloride (K+ POTASSIUM PO) Take 1 tablet by mouth daily. From [facility]    . QUEtiapine (SEROQUEL) 400 MG tablet Take 400 mg by mouth at bedtime.     . topiramate (TOPAMAX) 50 MG tablet Take 1 tablet (50 mg total) by mouth 2 (two) times daily. 60 tablet 5   No current facility-administered medications on file prior to visit.     Objective:  Objective  Physical Exam Vitals and nursing note reviewed.  Constitutional:      Appearance: She is well-developed.  HENT:     Head: Normocephalic and atraumatic.  Eyes:     Conjunctiva/sclera: Conjunctivae normal.  Neck:     Thyroid: No thyromegaly.     Vascular: No carotid bruit or JVD.  Cardiovascular:     Rate and Rhythm: Normal rate and regular rhythm.     Heart sounds: Normal heart sounds. No murmur heard.   Pulmonary:     Effort: Pulmonary effort is normal. No respiratory distress.     Breath sounds: Normal breath sounds. No wheezing or rales.  Chest:     Chest wall: No tenderness.  Abdominal:     Tenderness: There is abdominal tenderness. There is right CVA tenderness and guarding. There is no left CVA tenderness or rebound.  Musculoskeletal:        General: Tenderness and signs of injury present.     Cervical back: Normal range of motion and neck  supple.     Thoracic back: Tenderness present.       Back:  Neurological:     Mental Status: She is alert and oriented to person, place, and time.    BP 96/66 (BP Location: Right Arm, Cuff Size: Normal)   Pulse 99   Temp 98.4 F (36.9 C) (Oral)   Resp 12   Ht 5\' 2"  (1.575 m)   Wt 86 lb (39 kg)   SpO2 93%   BMI 15.73 kg/m  Wt Readings from Last 3 Encounters:  01/15/20 86 lb (39 kg)  10/24/19 53 lb (24 kg)  10/02/19 92 lb 6.4 oz (41.9 kg)     Lab Results  Component Value Date   WBC 8.2 10/02/2019   HGB 12.1 10/02/2019   HCT 36.1 10/02/2019   PLT 299.0 10/02/2019   GLUCOSE 93 10/02/2019   CHOL 143 04/24/2017   TRIG 85 04/24/2017  HDL 49 04/24/2017   LDLCALC 77 04/24/2017   ALT 16 10/02/2019   AST 18 10/02/2019   NA 139 10/02/2019   K 4.0 10/02/2019   CL 105 10/02/2019   CREATININE 0.68 10/02/2019   BUN 9 10/02/2019   CO2 30 10/02/2019   TSH 1.03 07/22/2019   INR 0.9 12/19/2018   HGBA1C 5.0 04/24/2017    No results found.   Assessment & Plan:  Plan  I have discontinued Quanika R. Yeatts's rizatriptan, pantoprazole, potassium chloride, azithromycin, and promethazine-dextromethorphan. I am also having her start on HYDROcodone-acetaminophen. Additionally, I am having her maintain her gabapentin, hydrOXYzine, QUEtiapine, oxybutynin, lamoTRIgine, mirtazapine, topiramate, fluticasone, clonazePAM, acetaminophen-codeine, budesonide-formoterol, albuterol, omeprazole, Potassium Chloride (K+ POTASSIUM PO), and promethazine.  Meds ordered this encounter  Medications  . promethazine (PHENERGAN) 25 MG tablet    Sig: TAKE (1) TABLET EVERY SIX HOURS AS NEEDED FOR NAUSEA AND VOMITING.    Dispense:  12 tablet    Refill:  0  . HYDROcodone-acetaminophen (NORCO/VICODIN) 5-325 MG tablet    Sig: Take 1 tablet by mouth every 6 (six) hours as needed for up to 5 days for moderate pain.    Dispense:  20 tablet    Refill:  0    Problem List Items Addressed This Visit       Unprioritized   Rib pain - Primary   Relevant Medications   HYDROcodone-acetaminophen (NORCO/VICODIN) 5-325 MG tablet   Other Relevant Orders   DG Ribs Unilateral W/Chest Right (Completed)    Other Visit Diagnoses    Nausea       Relevant Medications   promethazine (PHENERGAN) 25 MG tablet   Elevated lipase       Relevant Orders   Amylase   Lipase   Severe protein-calorie malnutrition (HCC)       Relevant Orders   Amb ref to Medical Nutrition Therapy-MNT   Amylase   Epigastric pain       Relevant Orders   Comprehensive metabolic panel      Follow-up: Return if symptoms worsen or fail to improve.  [name], DO

## 2020-01-16 ENCOUNTER — Other Ambulatory Visit: Payer: Self-pay | Admitting: Family Medicine

## 2020-01-16 DIAGNOSIS — R739 Hyperglycemia, unspecified: Secondary | ICD-10-CM

## 2020-01-16 LAB — AMYLASE: Amylase: 33 U/L (ref 21–101)

## 2020-01-16 LAB — COMPREHENSIVE METABOLIC PANEL
AG Ratio: 1.6 (calc) (ref 1.0–2.5)
ALT: 10 U/L (ref 6–29)
AST: 16 U/L (ref 10–30)
Albumin: 4.3 g/dL (ref 3.6–5.1)
Alkaline phosphatase (APISO): 111 U/L (ref 31–125)
BUN: 9 mg/dL (ref 7–25)
CO2: 29 mmol/L (ref 20–32)
Calcium: 9.8 mg/dL (ref 8.6–10.2)
Chloride: 105 mmol/L (ref 98–110)
Creat: 0.77 mg/dL (ref 0.50–1.10)
Globulin: 2.7 g/dL (calc) (ref 1.9–3.7)
Glucose, Bld: 134 mg/dL — ABNORMAL HIGH (ref 65–99)
Potassium: 4.1 mmol/L (ref 3.5–5.3)
Sodium: 140 mmol/L (ref 135–146)
Total Bilirubin: 0.3 mg/dL (ref 0.2–1.2)
Total Protein: 7 g/dL (ref 6.1–8.1)

## 2020-01-16 LAB — LIPASE: Lipase: 11 U/L (ref 7–60)

## 2020-01-23 ENCOUNTER — Telehealth: Payer: Self-pay | Admitting: Family Medicine

## 2020-01-23 NOTE — Telephone Encounter (Signed)
Requesting a call back in reference to lab results

## 2020-01-26 DIAGNOSIS — R339 Retention of urine, unspecified: Secondary | ICD-10-CM | POA: Diagnosis not present

## 2020-01-26 NOTE — Telephone Encounter (Signed)
Attempted to call patient. Phone rang and went to busy signal

## 2020-02-02 ENCOUNTER — Encounter: Payer: BC Managed Care – PPO | Attending: Family Medicine | Admitting: Nutrition

## 2020-02-02 ENCOUNTER — Encounter: Payer: Self-pay | Admitting: Nutrition

## 2020-02-02 ENCOUNTER — Other Ambulatory Visit: Payer: Self-pay

## 2020-02-02 DIAGNOSIS — F319 Bipolar disorder, unspecified: Secondary | ICD-10-CM | POA: Insufficient documentation

## 2020-02-02 DIAGNOSIS — R627 Adult failure to thrive: Secondary | ICD-10-CM | POA: Insufficient documentation

## 2020-02-02 DIAGNOSIS — E43 Unspecified severe protein-calorie malnutrition: Secondary | ICD-10-CM | POA: Insufficient documentation

## 2020-02-02 DIAGNOSIS — K852 Alcohol induced acute pancreatitis without necrosis or infection: Secondary | ICD-10-CM

## 2020-02-02 DIAGNOSIS — R636 Underweight: Secondary | ICD-10-CM

## 2020-02-02 NOTE — Progress Notes (Signed)
Medical Nutrition Therapy:  Appt start time: 1600 end time:  1715   Assessment:  Primary concerns today: Pancreatitis, Protein Calorie Malnutrition. PMH: Ovarian cancer, and Bipolar. Here with her mom. She notes she has chronic Pancreatitis. History of alcohol abuse.  Wt is 88 lbs. Continues to lose weight.   Wt 1 yr ago, 125 lbs. Quit drinking a year ago.  She has signs of protein calorie malnutrition as evidenced by bony body parts, no fat reserves, sunken eye sockets, bi temporal wasting.  Has seen Dr. Laural Golden for GI issues, Had an infection in her colon in the past.  Was an alcoholic and  Has been clean since Sept 2021. Sees Dr. Erling Cruz a psychiatrist for her depressiion. She notes she is a ' cutter' but hasn't done it in a long time. Takes care of her 2 grandchildren. Typically eats 1-2 meals per day. Has stomach pains or diarrhea at times or nausea and vomiting depending on what she eats.  Is chronically tired and fatigued. No energy. Gets weak and shaky and dizzy at times. Hasn't checked blood sugars before. No A1C for review.  Has not seen an endocrinologist before. Would recommend referral to endocrinologist to rule out Exocrine Pancreatic Insuffiency and possible need for creon due to pancreatitis.  CMP Latest Ref Rng & Units 01/15/2020 10/02/2019 08/20/2019  Glucose 65 - 99 mg/dL 134(H) 93 93  BUN 7 - 25 mg/dL 9 9 11   Creatinine 0.50 - 1.10 mg/dL 0.77 0.68 0.62  Sodium 135 - 146 mmol/L 140 139 136  Potassium 3.5 - 5.3 mmol/L 4.1 4.0 3.6  Chloride 98 - 110 mmol/L 105 105 103  CO2 20 - 32 mmol/L 29 30 24   Calcium 8.6 - 10.2 mg/dL 9.8 9.2 9.0  Total Protein 6.1 - 8.1 g/dL 7.0 6.6 7.3  Total Bilirubin 0.2 - 1.2 mg/dL 0.3 0.2 0.3  Alkaline Phos 39 - 117 U/L - 96 107  AST 10 - 30 U/L 16 18 23   ALT 6 - 29 U/L 10 16 12    Lab Results  Component Value Date   HGBA1C 5.0 04/24/2017   Lipid Panel     Component Value Date/Time   CHOL 143 04/24/2017 0724   TRIG 85 04/24/2017 0724    HDL 49 04/24/2017 0724   CHOLHDL 2.9 04/24/2017 0724   VLDL 17 04/24/2017 0724   LDLCALC 77 04/24/2017 0724     No preference indicated   Learning Readiness:  Ready  Change in progress   MEDICATIONS:   DIETARY INTAKE.    24-hr recall:  B ( AM): 2 boiled eggs L) Honey bun   Usual physical activity: Takes care of grandkids  Estimated energy needs: 1800-2000 calories 225g carbohydrates 150 g protein 56 g fat  Progress Towards Goal(s):  In progress.   Nutritional Diagnosis:  NI-1.5 Excessive energy intake As related to Underweight and PCM and weight loss.  As evidenced by Loss of 20+ lbs, emaciation, muscle wasting, cachetic and pancreatitis..    Intervention:  Nutrition and Diabetes education provided on My Plate, CHO counting, meal planning, portion sizes, timing of meals, avoiding snacks between meals unless having a low blood sugar, target ranges for A1C and blood sugars, signs/symptoms and treatment of hyper/hypoglycemia, monitoring blood sugars, taking medications as prescribed, benefits of exercising 30 minutes per day and prevention of complications of DM. Marland KitchenGoals  Eat small frequent meals. Drink 2 supplements daily. Cut out fried and high fat processed foods. NO fast foods. Eat snack between meals. Drink  only water and cut out sodas. Increase fruit and vegtables. Gain 2 lbs in the next 2-3 weeks. Talk to DR. About referring you to an endocrinologist due to your pancreatitis.  Teaching Method Utilized:  Visual Auditory Hands on  Handouts given during visit include:  The Plate Method  Medical Nutrition Therapy for Pancreatitis  Reading food labels.   Barriers to learning/adherence to lifestyle change: Bipolar  Demonstrated degree of understanding via:  Teach Back   Monitoring/Evaluation:  Dietary intake, exercise, , and body weight in 1 month(s).  Recommend referral to Dr. Dorris Fetch, Endocrinologist here in [place] to evaluate for endocrine issues  and pancreatitis. She may benefit from pancreatitic enzymes for absorption of nutrients for needed weight gain and improved nutrition.

## 2020-02-02 NOTE — Patient Instructions (Signed)
Goals  Eat small frequent meals. Drink 2 supplements daily. Cut out fried and high fat processed foods. NO fast foods. Eat snack between meals. Drink only water and cut out sodas. Increase fruit and vegtables. Gain 2 lbs in the next 2-3 weeks. Talk to DR. About referring you to an endocrinologist due to your pancreatitis.

## 2020-02-23 DIAGNOSIS — R339 Retention of urine, unspecified: Secondary | ICD-10-CM | POA: Diagnosis not present

## 2020-02-26 ENCOUNTER — Ambulatory Visit: Payer: BC Managed Care – PPO | Admitting: Nutrition

## 2020-02-26 ENCOUNTER — Other Ambulatory Visit: Payer: Self-pay | Admitting: Family Medicine

## 2020-02-26 DIAGNOSIS — R110 Nausea: Secondary | ICD-10-CM

## 2020-02-27 ENCOUNTER — Other Ambulatory Visit: Payer: Self-pay

## 2020-02-27 DIAGNOSIS — J440 Chronic obstructive pulmonary disease with acute lower respiratory infection: Secondary | ICD-10-CM

## 2020-02-27 DIAGNOSIS — J209 Acute bronchitis, unspecified: Secondary | ICD-10-CM

## 2020-02-27 MED ORDER — ALBUTEROL SULFATE HFA 108 (90 BASE) MCG/ACT IN AERS: INHALATION_SPRAY | RESPIRATORY_TRACT | 1 refills | Status: DC

## 2020-03-01 ENCOUNTER — Ambulatory Visit: Payer: BC Managed Care – PPO | Admitting: Nutrition

## 2020-03-11 ENCOUNTER — Emergency Department (HOSPITAL_COMMUNITY)
Admission: EM | Admit: 2020-03-11 | Discharge: 2020-03-12 | Disposition: A | Discharge status: Home / Self Care | Payer: BC Managed Care – PPO | Attending: Emergency Medicine | Admitting: Emergency Medicine

## 2020-03-11 ENCOUNTER — Encounter (HOSPITAL_COMMUNITY): Payer: Self-pay

## 2020-03-11 ENCOUNTER — Other Ambulatory Visit: Payer: Self-pay

## 2020-03-11 ENCOUNTER — Telehealth (INDEPENDENT_AMBULATORY_CARE_PROVIDER_SITE_OTHER): Payer: BC Managed Care – PPO | Admitting: Family Medicine

## 2020-03-11 DIAGNOSIS — J45909 Unspecified asthma, uncomplicated: Secondary | ICD-10-CM | POA: Diagnosis not present

## 2020-03-11 DIAGNOSIS — Z86010 Personal history of colonic polyps: Secondary | ICD-10-CM | POA: Insufficient documentation

## 2020-03-11 DIAGNOSIS — R52 Pain, unspecified: Secondary | ICD-10-CM | POA: Diagnosis not present

## 2020-03-11 DIAGNOSIS — E876 Hypokalemia: Secondary | ICD-10-CM | POA: Insufficient documentation

## 2020-03-11 DIAGNOSIS — J441 Chronic obstructive pulmonary disease with (acute) exacerbation: Secondary | ICD-10-CM | POA: Diagnosis not present

## 2020-03-11 DIAGNOSIS — J4530 Mild persistent asthma, uncomplicated: Secondary | ICD-10-CM | POA: Diagnosis not present

## 2020-03-11 DIAGNOSIS — Z79899 Other long term (current) drug therapy: Secondary | ICD-10-CM | POA: Diagnosis not present

## 2020-03-11 DIAGNOSIS — I10 Essential (primary) hypertension: Secondary | ICD-10-CM | POA: Insufficient documentation

## 2020-03-11 DIAGNOSIS — M545 Low back pain, unspecified: Secondary | ICD-10-CM | POA: Diagnosis not present

## 2020-03-11 DIAGNOSIS — F17210 Nicotine dependence, cigarettes, uncomplicated: Secondary | ICD-10-CM | POA: Insufficient documentation

## 2020-03-11 DIAGNOSIS — Z7951 Long term (current) use of inhaled steroids: Secondary | ICD-10-CM | POA: Diagnosis not present

## 2020-03-11 DIAGNOSIS — M549 Dorsalgia, unspecified: Secondary | ICD-10-CM | POA: Diagnosis not present

## 2020-03-11 DIAGNOSIS — R0602 Shortness of breath: Secondary | ICD-10-CM | POA: Diagnosis not present

## 2020-03-11 DIAGNOSIS — M5440 Lumbago with sciatica, unspecified side: Secondary | ICD-10-CM | POA: Diagnosis not present

## 2020-03-11 DIAGNOSIS — Z8541 Personal history of malignant neoplasm of cervix uteri: Secondary | ICD-10-CM | POA: Insufficient documentation

## 2020-03-11 DIAGNOSIS — R0902 Hypoxemia: Secondary | ICD-10-CM | POA: Diagnosis not present

## 2020-03-11 DIAGNOSIS — R4020 Unspecified coma: Secondary | ICD-10-CM | POA: Diagnosis not present

## 2020-03-11 LAB — RAPID URINE DRUG SCREEN, HOSP PERFORMED
Amphetamines: NOT DETECTED
Barbiturates: NOT DETECTED
Benzodiazepines: NOT DETECTED
Cocaine: NOT DETECTED
Opiates: POSITIVE — AB
Tetrahydrocannabinol: POSITIVE — AB

## 2020-03-11 LAB — URINALYSIS, ROUTINE W REFLEX MICROSCOPIC
Bilirubin Urine: NEGATIVE
Glucose, UA: NEGATIVE mg/dL
Hgb urine dipstick: NEGATIVE
Ketones, ur: 5 mg/dL — AB
Leukocytes,Ua: NEGATIVE
Nitrite: NEGATIVE
Protein, ur: NEGATIVE mg/dL
Specific Gravity, Urine: 1.01 (ref 1.005–1.030)
pH: 6 (ref 5.0–8.0)

## 2020-03-11 LAB — ETHANOL: Alcohol, Ethyl (B): <10 mg/dL (ref <10)

## 2020-03-11 LAB — CBC WITH DIFFERENTIAL/PLATELET
Abs Immature Granulocytes: 0.09 10*3/uL — ABNORMAL HIGH (ref 0.00–0.07)
Basophils Absolute: 0.1 10*3/uL (ref 0.0–0.1)
Basophils Relative: 0 %
Eosinophils Absolute: 0 10*3/uL (ref 0.0–0.5)
Eosinophils Relative: 0 %
HCT: 37.9 % (ref 36.0–46.0)
Hemoglobin: 12.7 g/dL (ref 12.0–15.0)
Immature Granulocytes: 1 %
Lymphocytes Relative: 8 %
Lymphs Abs: 1.4 10*3/uL (ref 0.7–4.0)
MCH: 31.2 pg (ref 26.0–34.0)
MCHC: 33.5 g/dL (ref 30.0–36.0)
MCV: 93.1 fL (ref 80.0–100.0)
Monocytes Absolute: 0.9 10*3/uL (ref 0.1–1.0)
Monocytes Relative: 5 %
Neutro Abs: 15.4 10*3/uL — ABNORMAL HIGH (ref 1.7–7.7)
Neutrophils Relative %: 86 %
Platelets: 359 10*3/uL (ref 150–400)
RBC: 4.07 MIL/uL (ref 3.87–5.11)
RDW: 12.4 % (ref 11.5–15.5)
WBC: 17.8 10*3/uL — ABNORMAL HIGH (ref 4.0–10.5)
nRBC: 0 % (ref 0.0–0.2)

## 2020-03-11 LAB — COMPREHENSIVE METABOLIC PANEL
ALT: 9 U/L (ref 0–44)
AST: 20 U/L (ref 15–41)
Albumin: 4.4 g/dL (ref 3.5–5.0)
Alkaline Phosphatase: 90 U/L (ref 38–126)
Anion gap: 11 (ref 5–15)
BUN: 9 mg/dL (ref 6–20)
CO2: 21 mmol/L — ABNORMAL LOW (ref 22–32)
Calcium: 9.4 mg/dL (ref 8.9–10.3)
Chloride: 102 mmol/L (ref 98–111)
Creatinine, Ser: 0.74 mg/dL (ref 0.44–1.00)
GFR, Estimated: >60 mL/min (ref >60)
Glucose, Bld: 110 mg/dL — ABNORMAL HIGH (ref 70–99)
Potassium: 2.7 mmol/L — CL (ref 3.5–5.1)
Sodium: 134 mmol/L — ABNORMAL LOW (ref 135–145)
Total Bilirubin: 0.9 mg/dL (ref 0.3–1.2)
Total Protein: 7.9 g/dL (ref 6.5–8.1)

## 2020-03-11 MED ORDER — KETOROLAC TROMETHAMINE 60 MG/2ML IM SOLN
60.0000 mg | Freq: Once | INTRAMUSCULAR | Status: AC
  Administered 2020-03-11: 60 mg via INTRAMUSCULAR
  Filled 2020-03-11: qty 2

## 2020-03-11 MED ORDER — POTASSIUM CHLORIDE 10 MEQ/100ML IV SOLN
10.0000 meq | INTRAVENOUS | Status: AC
  Administered 2020-03-11 – 2020-03-12 (×3): 10 meq via INTRAVENOUS
  Filled 2020-03-11 (×4): qty 100

## 2020-03-11 MED ORDER — ONDANSETRON 4 MG PO TBDP
4.0000 mg | ORAL_TABLET | Freq: Once | ORAL | Status: AC
  Administered 2020-03-11: 4 mg via ORAL
  Filled 2020-03-11: qty 1

## 2020-03-11 MED ORDER — ALBUTEROL SULFATE HFA 108 (90 BASE) MCG/ACT IN AERS
2.0000 | INHALATION_SPRAY | Freq: Once | RESPIRATORY_TRACT | Status: AC
  Administered 2020-03-11: 2 via RESPIRATORY_TRACT
  Filled 2020-03-11: qty 6.7

## 2020-03-11 MED ORDER — HYDROMORPHONE HCL 1 MG/ML IJ SOLN
1.0000 mg | Freq: Once | INTRAMUSCULAR | Status: AC
  Administered 2020-03-11: 1 mg via INTRAMUSCULAR
  Filled 2020-03-11: qty 1

## 2020-03-11 MED ORDER — POTASSIUM CHLORIDE CRYS ER 20 MEQ PO TBCR: 40.0000 meq | EXTENDED_RELEASE_TABLET | Freq: Once | ORAL | Status: DC

## 2020-03-11 NOTE — ED Notes (Signed)
Date and time results received: 03/11/20 2005  Test: Potassium Critical Value: 2.7  Name of Provider Notified: Cyndi Bender, PA-C  Orders Received? Or Actions Taken?: Orders Received - See Orders for details

## 2020-03-11 NOTE — ED Notes (Signed)
This RN attempted IV placement x3 without success.

## 2020-03-11 NOTE — ED Notes (Signed)
Pt frequently calling for staff, stating "Whatever you gave me didn't do anything except make my butt sore. I'm immune to most medicines because I've had so many surgeries and I'm allergic to morphine. I can't take this anymore." Pt educated on current plan of care and PA notified at this time.

## 2020-03-11 NOTE — ED Triage Notes (Signed)
Pt arrives via EMS, coming from [employer] parking lot.  EMS reports that patient was found sitting in her car, screaming and reporting severe back pain.  Reports that pain started last night, and progressively got worse today.  Pt arrives screaming, rolling in bed and restless.  PA to bedside at this time.

## 2020-03-11 NOTE — ED Notes (Signed)
ED Provider at bedside. 

## 2020-03-11 NOTE — ED Provider Notes (Signed)
   Pt signed out to me by Caryl Ada, PA-C pending completion of work up.    1920  Pt has given urine sample.  Labs pending.  Patient reports improvement of her pain after receiving pain medication.  Resting comfortably.  She is no longer hitting herself or thrashing around on the stretcher  Labs Reviewed  URINALYSIS, [place] - Abnormal; Notable for the following components:      Result Value   Ketones, ur 5 (*)    All other components within normal limits  RAPID URINE DRUG SCREEN, HOSP PERFORMED - Abnormal; Notable for the following components:   Opiates POSITIVE (*)    Tetrahydrocannabinol POSITIVE (*)    All other components within normal limits  CBC WITH DIFFERENTIAL/PLATELET - Abnormal; Notable for the following components:   WBC 17.8 (*)    Neutro Abs 15.4 (*)    Abs Immature Granulocytes 0.09 (*)    All other components within normal limits  COMPREHENSIVE METABOLIC PANEL - Abnormal; Notable for the following components:   Sodium 134 (*)    Potassium 2.7 (*)    CO2 21 (*)    Glucose, Bld 110 (*)    All other components within normal limits  ETHANOL    Laboratory studies show patient is positive for opiates and THC.  She takes Tylenol 3 chronically for her back pain.  WBC count elevated likely to her hyperactive state.  Doubt infectious process.  Significant hypokalemia, potassium of 2.7.  Patient notes history of same although on review of medical records, patient's potassium was 4.1 in October of this year. She states that she has difficulty tolerating oral potassium.  Reason for her hypokalemia is unclear.     1110  I was notified that her K+ was started at 2230.  First IV run infusing now.  patient resting, no acute distress.    2355 IV potassium still running, it is felt that patient is appropriate for discharge home after 3 rounds of IV potassium.  She has also had oral potassium.  Patient with likely acute on chronic low back pain.  No concerning  exam findings for cauda equina or emergent process.  Database reviewed, patient has ongoing prescription for Tylenol 3.  I do not feel that additional opioids are indicated.  Discussed with Dr. Christy Gentles who agrees to dispo patient after potassium completed.     Kem Parkinson, PA-C 03/12/20 0011    Daleen Bo, MD 03/12/20 [phone]

## 2020-03-11 NOTE — ED Provider Notes (Signed)
[facility] Provider Note   CSN: [id number] Arrival date & time: 03/11/20  1534     History Chief Complaint  Patient presents with  . Back Pain    [name] is a 44 y.o. female.  Pt complains of severe back pain.  Pt reports she had a stimulator but she lost so much weight she had to have removed.  Pt brought in by EMS.  They report pt complaining of severe back spasm.  Records show pt called her primary care MD and told them she was having a severe asthma attack.  Pt reports she feels like her asthma medications are not working.  The history is provided by the patient. No language interpreter was used.  Back Pain Location:  Lumbar spine Pain severity:  Severe Pain is:  Same all the time Timing:  Constant Progression:  Worsening Chronicity:  New Relieved by:  Nothing Worsened by:  Nothing Ineffective treatments:  None tried Associated symptoms: no abdominal pain        Past Medical History:  Diagnosis Date  . 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 Anal fissure 03/11/2009  . Anemia   . Anxiety   . Anxiety and depression   . ARTHRITIS 08/31/2007  . Arthritis   . Asthma   . 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 Bipolar 1 disorder ([place])   . Bowel obstruction ([place])   . Breakdown of urinary electronic stimulator device, init ([place])   . BRONCHITIS, RECURRENT 08/23/2009   Asthmatic Bronchitis-Dr. Melvyn Novas.....-HFA 75% 12/04/2008>75% 02/05/2009>75% 08/04/2009 -PFT's 01/04/2009 2.56 (86%) ratio 75, no resp to B2 and DLC0 67% > 80 after correction   . Cancer (HCC)    cervical cancer  . Chronic interstitial cystitis 03/11/2009  . Chronic nausea   . Chronic pain   . Colon polyps   . COLONIC POLYPS, HX OF 07/25/2006   ADENOMATOUS POLYP  . COPD (chronic obstructive pulmonary disease) ([place])   . DEPRESSION 08/31/2007  . Endometriosis   . FIBROMYALGIA 08/31/2007  . Fibromyalgia   . GERD 02/05/2009  . Hyperlipidemia   . HYPERTENSION 08/31/2007  . IBS  03/11/2009  . Internal hemorrhoids   . Migraine headache   . 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 PONV (postoperative nausea and vomiting)   . RECTAL BLEEDING 03/11/2009  . Seizures ([place])    been about 1 year since last seisure per pt  . SLEEP APNEA 08/31/2007  . Substance abuse ([place])   . Thyroid disease   . Uterine cyst     Patient Active Problem List   Diagnosis Date Noted  . Encounter by telehealth for suspected COVID-19 03/20/2019  . Abdominal pain, epigastric 10/07/2018  . Intractable vomiting 08/09/2018  . Compression fracture of fifth lumbar vertebra (HCC) 02/07/2018  . Altered mental status 02/07/2018  . Generalized weakness 01/24/2018  . MDD (major depressive disorder) 04/23/2017  . Substance abuse ([place]) 04/19/2017  . Hyperthyroidism 02/17/2015  . Anxiety 01/28/2015  . Clinical depression 01/28/2015  . COPD with acute exacerbation ([place]) 11/26/2013  . Vaginitis and vulvovaginitis 11/26/2013  . Recurrent pneumonia 02/02/2013  . Left arm pain 01/13/2013  . Left arm pain 01/13/2013    Class: Acute  . Dizziness and giddiness 01/13/2013  . Acute bronchitis 12/07/2012  . Rib pain 12/07/2012  . Insomnia 08/26/2012  . Diarrhea 06/19/2012  . Heme positive stool 06/19/2012  . Bloating 06/19/2012  . Nausea alone 06/19/2012  . Bladder retention 04/24/2012  . Tremor 03/05/2012  . Dysuria 03/05/2012  . Failure  to thrive in adult 06/21/2011  . Airway hyperreactivity 03/08/2011  . Back pain, chronic 03/08/2011  . Chronic obstructive pulmonary disease ([place]) 03/08/2011  . Acid reflux 03/08/2011  . Cystitis 01/22/2011  . Bilateral hand numbness 01/13/2011  . WEIGHT LOSS 06/24/2010  . RIB PAIN, LEFT SIDED 05/04/2010  . ABDOMINAL PAIN, LEFT UPPER QUADRANT 05/04/2010  . Unspecified otitis media 10/26/2009  . BRONCHITIS, RECURRENT 08/23/2009  . URI, ACUTE 08/04/2009  . FATIGUE 07/06/2009  . IBS 03/11/2009  . ANAL FISSURE 03/11/2009  . RECTAL BLEEDING 03/11/2009  . Chronic  interstitial cystitis 03/11/2009  . COLONIC POLYPS, HX OF 03/11/2009  . GERD 02/05/2009  . SMOKER 12/04/2008  . chronic asthma poorly controlled 12/04/2008  . 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 Bipolar disorder ([place]) 08/31/2007  . HYPERTENSION 08/31/2007  . EMPHYSEMA 08/31/2007  . 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 ARTHRITIS 08/31/2007  . FIBROMYALGIA 08/31/2007  . SLEEP APNEA 08/31/2007    Past Surgical History:  Procedure Laterality Date  . ABDOMINAL HYSTERECTOMY    . BIOPSY  10/25/2018   Procedure: BIOPSY;  Surgeon: Rogene Houston, MD;  [facility];  Service: Endoscopy;;  gastric  . bladder stretching x6    . BLADDER SURGERY     stimulator placed and stretching   . CHOLECYSTECTOMY    . COLONOSCOPY    . COLONOSCOPY WITH PROPOFOL N/A 09/03/2014   Procedure: COLONOSCOPY WITH PROPOFOL;  Surgeon: Milus Banister, MD;  [facility];  Service: Endoscopy;  Laterality: N/A;  . ESOPHAGOGASTRODUODENOSCOPY (EGD) WITH PROPOFOL N/A 10/25/2018   Procedure: ESOPHAGOGASTRODUODENOSCOPY (EGD) WITH PROPOFOL;  Surgeon: Rogene Houston, MD;  [facility];  Service: Endoscopy;  Laterality: N/A;  11:15  . interstitial cystitis    . PACEMAKER INSERTION     in hip for interstitial cystitis  . removal of uterine cyst and scrapped uterus    . replaced bladder pacemaker       OB History    Gravida  3   Para  2   Term      Preterm      AB  1   Living  2     SAB  1   TAB  0   Ectopic  0   Multiple  0   Live Births  2           Family History  Problem Relation Age of Onset  . Heart disease Father   . Asthma Maternal Grandmother   . Emphysema Maternal Grandfather   . Cancer Maternal Grandfather        Lung Cancer  . Cancer Other        Lung Cancer-Aunt  . Colon cancer Neg Hx   . Esophageal cancer Neg Hx   . Rectal cancer Neg Hx   . Stomach cancer Neg Hx   . Thyroid disease Neg Hx     Social  History   Tobacco Use  . Smoking status: Current Every Day Smoker    Packs/day: 0.50    Years: 14.00    Pack years: 7.00    Types: Cigarettes  . Smokeless tobacco: Never Used  Vaping Use  . Vaping Use: Never used  Substance Use Topics  . Alcohol use: Not Currently    Comment: last drink 3 weeks ago; recently released from rehab  . Drug use: Not Currently    Types: Marijuana    Comment: once a month    Home Medications  Prior to Admission medications   Medication Sig Start Date End Date Taking? Authorizing Provider  acetaminophen-codeine (TYLENOL #4) 300-60 MG tablet Take 1 tablet by mouth every 4 (four) hours as needed for moderate pain.    [provider]  albuterol (PROAIR HFA) 108 (90 Base) MCG/ACT inhaler INHALE 2 PUFFS EVERY 6 HOURS AS NEEDED FOR SHORTNESS OF BREATH AND WHEEZING. 02/27/20   Carollee Herter, Alferd Apa, DO  budesonide-formoterol (SYMBICORT) 80-4.5 MCG/ACT inhaler Inhale 2 puffs into the lungs 2 (two) times daily. 08/30/19   Saguier, Percell Miller, PA-C  clonazePAM (KLONOPIN) 0.5 MG tablet Take 1 tablet (0.5 mg total) by mouth 3 (three) times daily as needed for anxiety. 05/01/19   Roma Schanz R, DO  fluticasone (FLONASE) 50 MCG/ACT nasal spray Place 2 sprays into both nostrils daily. Patient taking differently: Place 2 sprays into both nostrils daily as needed for allergies.  04/22/19   [name], DO  gabapentin (NEURONTIN) 300 MG capsule Take 1 capsule (300 mg total) by mouth 2 (two) times daily. For agitation Patient taking differently: Take 300 mg by mouth 3 (three) times daily.  01/25/18   Barton Dubois, MD  hydrOXYzine (ATARAX/VISTARIL) 25 MG tablet Take 25 mg by mouth at bedtime.  08/26/18   [provider]  lamoTRIgine (LAMICTAL) 25 MG tablet Take 25 mg by mouth at bedtime.  12/13/18   [provider]  mirtazapine (REMERON) 45 MG tablet Take 22.5 mg by mouth at bedtime.  12/13/18   [provider]  omeprazole (PRILOSEC)  40 MG capsule TAKE 1 CAPSULE BY MOUTH ONCE DAILY. 12/11/19   Carollee Herter, Alferd Apa, DO  oxybutynin (DITROPAN) 5 MG tablet Take 5 mg by mouth every 8 (eight) hours as needed for bladder spasms.    [provider]  Potassium Chloride (K+ POTASSIUM PO) Take 1 tablet by mouth daily. From [facility]    [provider]  promethazine (PHENERGAN) 25 MG tablet Take 1 tablet (25 mg total) by mouth every 6 (six) hours as needed for nausea or vomiting. 02/26/20   [name], DO  QUEtiapine (SEROQUEL) 400 MG tablet Take 400 mg by mouth at bedtime.  09/16/18   [provider]  topiramate (TOPAMAX) 50 MG tablet Take 1 tablet (50 mg total) by mouth 2 (two) times daily. 03/20/19   [name], DO    Allergies    Abilify [aripiprazole], Amitriptyline, Metoclopramide hcl, Propoxyphene, Tramadol, Ambien [zolpidem tartrate], Eszopiclone, Codeine, Varenicline, Buprenorphine hcl, Demerol [meperidine], Emetrol, and Morphine and related  Review of Systems   Review of Systems  Respiratory: Negative for wheezing.   Gastrointestinal: Negative for abdominal pain.  Musculoskeletal: Positive for back pain.  All other systems reviewed and are negative.   Physical Exam Updated Vital Signs BP 115/79 (BP Location: Right Arm)   Pulse (!) 126   Temp 98.8 F (37.1 C) (Oral)   Resp (!) 22   Ht 5\' 2"  (1.575 m)   Wt 36.7 kg   SpO2 98%   BMI 14.82 kg/m   Physical Exam Vitals and nursing note reviewed.  Constitutional:      Appearance: She is well-developed.     Comments: Thin, malnourished appearing   HENT:     Head: Normocephalic.  Cardiovascular:     Rate and Rhythm: Normal rate.  Pulmonary:     Effort: Pulmonary effort is normal.     Breath sounds: No wheezing.  Abdominal:     General: Abdomen is flat.  There is no distension.  Musculoskeletal:        General: Normal range of motion.     Cervical back: Normal range of motion.  Neurological:     Mental Status: She is  alert and oriented to person, place, and time.     ED Results / Procedures / Treatments   Labs (all labs ordered are listed, but only abnormal results are displayed) Labs Reviewed  URINALYSIS, ROUTINE W REFLEX MICROSCOPIC  RAPID URINE DRUG SCREEN, HOSP PERFORMED    EKG None  Radiology No results found.  Procedures Procedures (including critical care time)  Medications Ordered in ED Medications  ketorolac (TORADOL) injection 60 mg (has no administration in time range)  albuterol (VENTOLIN HFA) 108 (90 Base) MCG/ACT inhaler 2 puff (has no administration in time range)  ondansetron (ZOFRAN-ODT) disintegrating tablet 4 mg (has no administration in time range)    ED Course  I have reviewed the triage vital signs and the nursing notes.  Pertinent labs & imaging results that were available during my care of the patient were reviewed by me and considered in my medical decision making (see chart for details).    MDM Rules/Calculators/A&P                          MDM:  Pt given toradol IM. For pain.  Pt not wheezing and 02 sats are 98%.  Pt given albuterol 2 puffs for symptoms.   Pt records reviewed. Pt has a history of bipolar disorder.  Pt reports she is taking her medications and seeing her Psychiatrist. Pt given toradol.  Pt reports it made pain worse.  Pt states she wants dilaudid.   Pt hitting herself in the head, thrashing in bed.  Pt's daughter at bedside.   Daughter reports she has never seen pt act like this.  I will give pt 1mg  of dilaudid IM.   I will order labs and possible tts consult. Pt's care turned over to [facility] Final Clinical Impression(s) / ED Diagnoses Final diagnoses:  Acute low back pain, unspecified back pain laterality, unspecified whether sciatica present  Mild asthma, unspecified whether complicated, unspecified whether persistent  Hypokalemia    Rx / DC Orders ED Discharge Orders    None       Sidney Ace 03/12/20  0913    Daleen Bo, MD 03/12/20 [phone]

## 2020-03-11 NOTE — Progress Notes (Signed)
[facility] at [facility] [address], [place], [place] [address] [phone]  Date:  03/11/2020   Name:  [name]   DOB:  [birth year]   MRN:  [id number]  PCP:  [name], DO    Chief Complaint: No chief complaint on file.   History of Present Illness:  [name] is a 44 y.o. very pleasant female patient who presents with the following:  Primary patient of my partner Dr. Etter Sjogren, virtual visit today for concern of illness-connected with patient via telephone.  The patient and myself are present on the call today  Patient location is her car, provider location is office Today pt notes that her "asthma is acting up the last 2 days", she is "shaking like a leaf and I can't stop it" She is feeing SOB She has noted these sx for 2 weeks but getting worse  No fever States she is in terrible pain  Patient sounds distressed and tearful, states she is at a store in [place] in her car, she has her 95-year-old grandson with her. I advised her that she should not be driving due to her current symptoms. We were discussing a plan to get her help, she declined to have me call an ambulance for her. She states she will have her mother come and pick them up and take her to the hospital, then I lost the call  I called the patient back 5 times and it went straight to voicemail. The only other number listed is for her husband, no answer and voicemail is full Checked HIPPA and did not find any contacts Spoke with PCP Dr Denton Ar is not aware of any other emergency contacts, no other relatives are our patient per her knowledge  I called the [facility] [employer] and asked them to attempt a welfare check for this patient and her grandson.  Gave them all the details that I had.    I called her phone at 4:15 phone answered by a PA in the ER, we are so glad that she is safe and getting care    Patient Active Problem List   Diagnosis  Date Noted  . Encounter by telehealth for suspected COVID-19 03/20/2019  . Abdominal pain, epigastric 10/07/2018  . Intractable vomiting 08/09/2018  . Compression fracture of fifth lumbar vertebra (HCC) 02/07/2018  . Altered mental status 02/07/2018  . Generalized weakness 01/24/2018  . MDD (major depressive disorder) 04/23/2017  . Substance abuse ([place]) 04/19/2017  . Hyperthyroidism 02/17/2015  . Anxiety 01/28/2015  . Clinical depression 01/28/2015  . COPD with acute exacerbation ([place]) 11/26/2013  . Vaginitis and vulvovaginitis 11/26/2013  . Recurrent pneumonia 02/02/2013  . Left arm pain 01/13/2013  . Left arm pain 01/13/2013    Class: Acute  . Dizziness and giddiness 01/13/2013  . Acute bronchitis 12/07/2012  . Rib pain 12/07/2012  . Insomnia 08/26/2012  . Diarrhea 06/19/2012  . Heme positive stool 06/19/2012  . Bloating 06/19/2012  . Nausea alone 06/19/2012  . Bladder retention 04/24/2012  . Tremor 03/05/2012  . Dysuria 03/05/2012  . Failure to thrive in adult 06/21/2011  . Airway hyperreactivity 03/08/2011  . Back pain, chronic 03/08/2011  . Chronic obstructive pulmonary disease ([place]) 03/08/2011  . Acid reflux 03/08/2011  . Cystitis 01/22/2011  . Bilateral hand numbness 01/13/2011  . WEIGHT LOSS 06/24/2010  . RIB PAIN, LEFT SIDED 05/04/2010  . ABDOMINAL PAIN, LEFT UPPER QUADRANT 05/04/2010  .  Unspecified otitis media 10/26/2009  . BRONCHITIS, RECURRENT 08/23/2009  . URI, ACUTE 08/04/2009  . FATIGUE 07/06/2009  . IBS 03/11/2009  . ANAL FISSURE 03/11/2009  . RECTAL BLEEDING 03/11/2009  . Chronic interstitial cystitis 03/11/2009  . COLONIC POLYPS, HX OF 03/11/2009  . GERD 02/05/2009  . SMOKER 12/04/2008  . chronic asthma poorly controlled 12/04/2008  . 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 Bipolar disorder ([place]) 08/31/2007  . HYPERTENSION 08/31/2007  . EMPHYSEMA 08/31/2007  . 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 ARTHRITIS  08/31/2007  . FIBROMYALGIA 08/31/2007  . SLEEP APNEA 08/31/2007    Past Medical History:  Diagnosis Date  . 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 Anal fissure 03/11/2009  . Anemia   . Anxiety   . Anxiety and depression   . ARTHRITIS 08/31/2007  . Arthritis   . Asthma   . 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 Bipolar 1 disorder ([place])   . Bowel obstruction ([place])   . Breakdown of urinary electronic stimulator device, init ([place])   . BRONCHITIS, RECURRENT 08/23/2009   Asthmatic Bronchitis-Dr. Melvyn Novas.....-HFA 75% 12/04/2008>75% 02/05/2009>75% 08/04/2009 -PFT's 01/04/2009 2.56 (86%) ratio 75, no resp to B2 and DLC0 67% > 80 after correction   . Cancer (HCC)    cervical cancer  . Chronic interstitial cystitis 03/11/2009  . Chronic nausea   . Chronic pain   . Colon polyps   . COLONIC POLYPS, HX OF 07/25/2006   ADENOMATOUS POLYP  . COPD (chronic obstructive pulmonary disease) ([place])   . DEPRESSION 08/31/2007  . Endometriosis   . FIBROMYALGIA 08/31/2007  . Fibromyalgia   . GERD 02/05/2009  . Hyperlipidemia   . HYPERTENSION 08/31/2007  . IBS 03/11/2009  . Internal hemorrhoids   . Migraine headache   . 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 PONV (postoperative nausea and vomiting)   . RECTAL BLEEDING 03/11/2009  . Seizures ([place])    been about 1 year since last seisure per pt  . SLEEP APNEA 08/31/2007  . Substance abuse ([place])   . Thyroid disease   . Uterine cyst     Past Surgical History:  Procedure Laterality Date  . ABDOMINAL HYSTERECTOMY    . BIOPSY  10/25/2018   Procedure: BIOPSY;  Surgeon: Rogene Houston, MD;  [facility];  Service: Endoscopy;;  gastric  . bladder stretching x6    . BLADDER SURGERY     stimulator placed and stretching   . CHOLECYSTECTOMY    . COLONOSCOPY    . COLONOSCOPY WITH PROPOFOL N/A 09/03/2014   Procedure: COLONOSCOPY WITH PROPOFOL;  Surgeon: Milus Banister, MD;  [facility];  Service: Endoscopy;  Laterality: N/A;  .  ESOPHAGOGASTRODUODENOSCOPY (EGD) WITH PROPOFOL N/A 10/25/2018   Procedure: ESOPHAGOGASTRODUODENOSCOPY (EGD) WITH PROPOFOL;  Surgeon: Rogene Houston, MD;  [facility];  Service: Endoscopy;  Laterality: N/A;  11:15  . interstitial cystitis    . PACEMAKER INSERTION     in hip for interstitial cystitis  . removal of uterine cyst and scrapped uterus    . replaced bladder pacemaker      Social History   Tobacco Use  . Smoking status: Current Every Day Smoker    Packs/day: 0.50    Years: 14.00    Pack years: 7.00    Types: Cigarettes  . Smokeless tobacco: Never Used  Vaping Use  . Vaping Use: Never used  Substance Use Topics  . Alcohol use: Not Currently    Comment: last drink 3 weeks ago;  recently released from rehab  . Drug use: Not Currently    Types: Marijuana    Comment: once a month    Family History  Problem Relation Age of Onset  . Heart disease Father   . Asthma Maternal Grandmother   . Emphysema Maternal Grandfather   . Cancer Maternal Grandfather        Lung Cancer  . Cancer Other        Lung Cancer-Aunt  . Colon cancer Neg Hx   . Esophageal cancer Neg Hx   . Rectal cancer Neg Hx   . Stomach cancer Neg Hx   . Thyroid disease Neg Hx     Allergies  Allergen Reactions  . Abilify [Aripiprazole] Swelling, Palpitations and Other (See Comments)    Throat swelling, tremors   . Amitriptyline Anaphylaxis    Angioedema  . Metoclopramide Hcl Other (See Comments)    Causes seizures  . Propoxyphene Rash  . Tramadol Swelling, Other (See Comments) and Rash    Throat swelling, tremors  . Ambien [Zolpidem Tartrate] Other (See Comments)    hallucinations  . Eszopiclone Other (See Comments)    Hallucinations, hyperactivity, and bad taste in mouth   . Codeine Other (See Comments)    Tylenol #3 caused itching  . Varenicline Other (See Comments)    Suicidal thoughts  . Buprenorphine Hcl Itching and Hives  . Demerol [Meperidine] Rash  . Emetrol Hives,  Itching and Rash  . Morphine And Related Hives and Itching    Medication list has been reviewed and updated.  Current Outpatient Medications on File Prior to Visit  Medication Sig Dispense Refill  . acetaminophen-codeine (TYLENOL #4) 300-60 MG tablet Take 1 tablet by mouth every 4 (four) hours as needed for moderate pain.    Marland Kitchen albuterol (PROAIR HFA) 108 (90 Base) MCG/ACT inhaler INHALE 2 PUFFS EVERY 6 HOURS AS NEEDED FOR SHORTNESS OF BREATH AND WHEEZING. 8.5 g 1  . budesonide-formoterol (SYMBICORT) 80-4.5 MCG/ACT inhaler Inhale 2 puffs into the lungs 2 (two) times daily. 1 Inhaler 2  . clonazePAM (KLONOPIN) 0.5 MG tablet Take 1 tablet (0.5 mg total) by mouth 3 (three) times daily as needed for anxiety. 90 tablet 1  . fluticasone (FLONASE) 50 MCG/ACT nasal spray Place 2 sprays into both nostrils daily. (Patient taking differently: Place 2 sprays into both nostrils daily as needed for allergies. ) 16 g 6  . gabapentin (NEURONTIN) 300 MG capsule Take 1 capsule (300 mg total) by mouth 2 (two) times daily. For agitation (Patient taking differently: Take 300 mg by mouth 3 (three) times daily. )    . hydrOXYzine (ATARAX/VISTARIL) 25 MG tablet Take 25 mg by mouth at bedtime.     . lamoTRIgine (LAMICTAL) 25 MG tablet Take 25 mg by mouth at bedtime.     . mirtazapine (REMERON) 45 MG tablet Take 22.5 mg by mouth at bedtime.     Marland Kitchen omeprazole (PRILOSEC) 40 MG capsule TAKE 1 CAPSULE BY MOUTH ONCE DAILY. 30 capsule 0  . oxybutynin (DITROPAN) 5 MG tablet Take 5 mg by mouth every 8 (eight) hours as needed for bladder spasms.    . Potassium Chloride (K+ POTASSIUM PO) Take 1 tablet by mouth daily. From [facility]    . promethazine (PHENERGAN) 25 MG tablet Take 1 tablet (25 mg total) by mouth every 6 (six) hours as needed for nausea or vomiting. 12 tablet 1  . QUEtiapine (SEROQUEL) 400 MG tablet Take 400 mg by mouth at bedtime.     Marland Kitchen  topiramate (TOPAMAX) 50 MG tablet Take 1 tablet (50 mg total) by mouth 2 (two) times  daily. 60 tablet 5   No current facility-administered medications on file prior to visit.    Review of Systems:  Was not able to complete interview due to losing call   Physical Examination: There were no vitals filed for this visit. There were no vitals filed for this visit. There is no height or weight on file to calculate BMI. Ideal Body Weight:    Patient sounds distressed.  No vital sign information taken  Assessment and Plan: SOB (shortness of breath)  See notes in history section above  Spoke on phone with pt for less than 5 minutes  Signed Lamar Blinks, MD

## 2020-03-12 MED ORDER — POTASSIUM CHLORIDE CRYS ER 20 MEQ PO TBCR: 20.0000 meq | EXTENDED_RELEASE_TABLET | Freq: Two times a day (BID) | ORAL | 0 refills | Status: DC

## 2020-03-12 NOTE — Discharge Instructions (Addendum)
Your blood work this evening shows that your potassium level was very low.  It is important that you take the potassium supplement as directed.  Call your primary doctor tomorrow to arrange a follow-up appointment as you will need to have your potassium level rechecked next week.

## 2020-03-17 ENCOUNTER — Telehealth: Payer: Self-pay | Admitting: Family Medicine

## 2020-03-17 NOTE — Telephone Encounter (Signed)
Patient states she was seeing virtually on 03/11/20 then rush to the hospital. While she was at the hospital one of the emergency room doctor told her she had made a "false report at her doctor office". Patient is wondering what is going on.

## 2020-03-17 NOTE — Telephone Encounter (Signed)
Spoke with patient. Pt states she had a bad experience with a ER doctor and wanted to know if she had a DPR in her chart. I advised the patient I did not see one at the time. Pt states being upset that the provider spoke with her daughter without her consent.

## 2020-03-19 ENCOUNTER — Ambulatory Visit (INDEPENDENT_AMBULATORY_CARE_PROVIDER_SITE_OTHER): Payer: BC Managed Care – PPO | Admitting: Medical

## 2020-03-19 ENCOUNTER — Other Ambulatory Visit: Payer: Self-pay

## 2020-03-19 VITALS — BP 100/64 | HR 92 | Ht 62.0 in | Wt 71.0 lb

## 2020-03-19 DIAGNOSIS — R531 Weakness: Secondary | ICD-10-CM | POA: Diagnosis not present

## 2020-03-19 DIAGNOSIS — K861 Other chronic pancreatitis: Secondary | ICD-10-CM

## 2020-03-19 DIAGNOSIS — R634 Abnormal weight loss: Secondary | ICD-10-CM | POA: Diagnosis not present

## 2020-03-19 DIAGNOSIS — E43 Unspecified severe protein-calorie malnutrition: Secondary | ICD-10-CM

## 2020-03-19 DIAGNOSIS — E876 Hypokalemia: Secondary | ICD-10-CM

## 2020-03-19 NOTE — Progress Notes (Addendum)
Subjective:    Patient ID: [name], female    DOB: 07-05-1975, 44 y.o.   MRN: [id number]  HPI  Pt in stating she feels like she is going to pass out.  Pt has history of low potassium and weight loss.In the past attempts to refer to gastroeeterologist. Pt states 3 years since saw gi.   I put in referral to dietician/nutrition in the pat.    Pt states she had passing out episodes before she was evaluated in ED recently.   She states had severe cramping before she went to ED. She was given potassium. Then discharged.  . Pt also had some back pain recently. Drug screen positive for opiates and marijuana.   Pt states she can't tolerate oral supplementation potassium in the past.  Pt weight is 71 lbs.  Pt was told she may need to potassium IV on regular basis.      Review of Systems  Constitutional: Positive for fatigue. Negative for chills and fever.       Feels like about to pass out.  Respiratory: Negative for apnea, cough, chest tightness and wheezing.   Cardiovascular: Negative for chest pain and palpitations.       Att times fast heart rate.  Gastrointestinal: Positive for nausea. Negative for abdominal pain and vomiting.  Musculoskeletal: Negative for myalgias.  Skin: Negative for rash.  Neurological: Negative for dizziness, weakness and numbness.  Hematological: Negative for adenopathy. Does not bruise/bleed easily.  Psychiatric/Behavioral: Negative for behavioral problems and confusion.   Past Medical History:  Diagnosis Date  . 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 Anal fissure 03/11/2009  . Anemia   . Anxiety   . Anxiety and depression   . ARTHRITIS 08/31/2007  . Arthritis   . Asthma   . 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 Bipolar 1 disorder ([place])   . Bowel obstruction ([place])   . Breakdown of urinary electronic stimulator device, init ([place])   . BRONCHITIS, RECURRENT 08/23/2009   Asthmatic Bronchitis-Dr. Melvyn Novas.....-HFA 75% 12/04/2008>75% 02/05/2009>75%  08/04/2009 -PFT's 01/04/2009 2.56 (86%) ratio 75, no resp to B2 and DLC0 67% > 80 after correction   . Cancer (HCC)    cervical cancer  . Chronic interstitial cystitis 03/11/2009  . Chronic nausea   . Chronic pain   . Colon polyps   . COLONIC POLYPS, HX OF 07/25/2006   ADENOMATOUS POLYP  . COPD (chronic obstructive pulmonary disease) ([place])   . DEPRESSION 08/31/2007  . Endometriosis   . FIBROMYALGIA 08/31/2007  . Fibromyalgia   . GERD 02/05/2009  . Hyperlipidemia   . HYPERTENSION 08/31/2007  . IBS 03/11/2009  . Internal hemorrhoids   . Migraine headache   . 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 PONV (postoperative nausea and vomiting)   . RECTAL BLEEDING 03/11/2009  . Seizures ([place])    been about 1 year since last seisure per pt  . SLEEP APNEA 08/31/2007  . Substance abuse ([place])   . Thyroid disease   . Uterine cyst      Social History   Socioeconomic History  . Marital status: Married    Spouse name: Not on file  . Number of children: 2  . Years of education: Not on file  . Highest education level: Not on file  Occupational History    Employer: UNEMPLOYED  Tobacco Use  . Smoking status: Current Every Day Smoker    Packs/day: 0.50    Years: 14.00    Pack years: 7.00    Types: Cigarettes  .  Smokeless tobacco: Never Used  Vaping Use  . Vaping Use: Never used  Substance and Sexual Activity  . Alcohol use: Not Currently    Comment: last drink 3 weeks ago; recently released from rehab  . Drug use: Not Currently    Types: Marijuana    Comment: once a month  . Sexual activity: Not on file  Other Topics Concern  . Not on file  Social History Narrative   Homemaker   Daily Caffeine Use-Mtn. Dew         Social Determinants of Health   Financial Resource Strain: Not on file  Food Insecurity: Not on file  Transportation Needs: Not on file  Physical Activity: Not on file  Stress: Not on file  Social Connections: Not on file  Intimate Partner Violence: Not on file    Past  Surgical History:  Procedure Laterality Date  . ABDOMINAL HYSTERECTOMY    . BIOPSY  10/25/2018   Procedure: BIOPSY;  Surgeon: Rogene Houston, MD;  Location: [facility];  Service: Endoscopy;;  gastric  . bladder stretching x6    . BLADDER SURGERY     stimulator placed and stretching   . CHOLECYSTECTOMY    . COLONOSCOPY    . COLONOSCOPY WITH PROPOFOL N/A 09/03/2014   Procedure: COLONOSCOPY WITH PROPOFOL;  Surgeon: Milus Banister, MD;  Location: [facility];  Service: Endoscopy;  Laterality: N/A;  . ESOPHAGOGASTRODUODENOSCOPY (EGD) WITH PROPOFOL N/A 10/25/2018   Procedure: ESOPHAGOGASTRODUODENOSCOPY (EGD) WITH PROPOFOL;  Surgeon: Rogene Houston, MD;  Location: [facility];  Service: Endoscopy;  Laterality: N/A;  11:15  . interstitial cystitis    . PACEMAKER INSERTION     in hip for interstitial cystitis  . removal of uterine cyst and scrapped uterus    . replaced bladder pacemaker      Family History  Problem Relation Age of Onset  . Heart disease Father   . Asthma Maternal Grandmother   . Emphysema Maternal Grandfather   . Cancer Maternal Grandfather        Lung Cancer  . Cancer Other        Lung Cancer-Aunt  . Colon cancer Neg Hx   . Esophageal cancer Neg Hx   . Rectal cancer Neg Hx   . Stomach cancer Neg Hx   . Thyroid disease Neg Hx     Allergies  Allergen Reactions  . Abilify [Aripiprazole] Swelling, Palpitations and Other (See Comments)    Throat swelling, tremors   . Amitriptyline Anaphylaxis    Angioedema  . Metoclopramide Hcl Other (See Comments)    Causes seizures  . Propoxyphene Rash  . Tramadol Swelling, Other (See Comments) and Rash    Throat swelling, tremors  . Ambien [Zolpidem Tartrate] Other (See Comments)    hallucinations  . Eszopiclone Other (See Comments)    Hallucinations, hyperactivity, and bad taste in mouth   . Codeine Other (See Comments)    Tylenol #3 caused itching  . Varenicline Other (See Comments)    Suicidal thoughts  .  Buprenorphine Hcl Itching and Hives  . Demerol [Meperidine] Rash  . Emetrol Hives, Itching and Rash  . Morphine And Related Hives and Itching    Current Outpatient Medications on File Prior to Visit  Medication Sig Dispense Refill  . acetaminophen (TYLENOL) 500 MG tablet Take 1,000 mg by mouth every 6 (six) hours as needed.    Marland Kitchen acetaminophen-codeine (TYLENOL #4) 300-60 MG tablet Take 1 tablet by mouth every 4 (four) hours  as needed for moderate pain.    Marland Kitchen albuterol (PROAIR HFA) 108 (90 Base) MCG/ACT inhaler INHALE 2 PUFFS EVERY 6 HOURS AS NEEDED FOR SHORTNESS OF BREATH AND WHEEZING. 8.5 g 1  . budesonide-formoterol (SYMBICORT) 80-4.5 MCG/ACT inhaler Inhale 2 puffs into the lungs 2 (two) times daily. 1 Inhaler 2  . clonazePAM (KLONOPIN) 0.5 MG tablet Take 1 tablet (0.5 mg total) by mouth 3 (three) times daily as needed for anxiety. 90 tablet 1  . estradiol (ESTRACE) 0.1 MG/GM vaginal cream Place 1 Applicatorful vaginally See admin instructions. Every other night for vaginal dryness    . fluticasone (FLONASE) 50 MCG/ACT nasal spray Place 2 sprays into both nostrils daily. (Patient taking differently: Place 2 sprays into both nostrils daily as needed for allergies.) 16 g 6  . gabapentin (NEURONTIN) 300 MG capsule Take 1 capsule (300 mg total) by mouth 2 (two) times daily. For agitation (Patient taking differently: Take 300 mg by mouth 3 (three) times daily.)    . hydrOXYzine (ATARAX/VISTARIL) 25 MG tablet Take 25 mg by mouth at bedtime.     Marland Kitchen Hyoscyamine Sulfate SL 0.125 MG SUBL Take 1 tablet by mouth every 4 (four) hours as needed (for cramping).     Marland Kitchen lamoTRIgine (LAMICTAL) 100 MG tablet Take 100 mg by mouth 2 (two) times daily.    . mirtazapine (REMERON) 45 MG tablet Take 22.5 mg by mouth at bedtime.     Marland Kitchen omeprazole (PRILOSEC) 40 MG capsule TAKE 1 CAPSULE BY MOUTH ONCE DAILY. 30 capsule 0  . oxybutynin (DITROPAN XL) 15 MG 24 hr tablet Take 15 mg by mouth daily.    Marland Kitchen oxybutynin (DITROPAN) 5 MG  tablet Take 5 mg by mouth every 8 (eight) hours as needed for bladder spasms.    . potassium chloride SA (KLOR-CON) 20 MEQ tablet Take 1 tablet (20 mEq total) by mouth 2 (two) times daily. 14 tablet 0  . promethazine (PHENERGAN) 25 MG tablet Take 1 tablet (25 mg total) by mouth every 6 (six) hours as needed for nausea or vomiting. 12 tablet 1  . QUEtiapine (SEROQUEL) 400 MG tablet Take 400 mg by mouth at bedtime.     . RESTASIS 0.05 % ophthalmic emulsion     . topiramate (TOPAMAX) 50 MG tablet Take 1 tablet (50 mg total) by mouth 2 (two) times daily. 60 tablet 5   No current facility-administered medications on file prior to visit.    BP 100/64   Pulse 92   Ht 5\' 2"  (1.575 m)   Wt 71 lb (32.2 kg) Comment: patient reported  SpO2 100%   BMI 12.99 kg/m       Objective:   Physical Exam  General Mental Status- Alert. General Appearance- Crying beginning of exam. Calmed at end.. Very thin appearance.   Skin General: Color- Normal Color. Moisture- Normal Moisture.  Neck- no jvd.  Chest and Lung Exam Auscultation: Breath Sounds:-Normal.  Cardiovascular Auscultation:Rythm- Regular. Murmurs & Other Heart Sounds:Auscultation of the heart reveals- No Murmurs.  Abdomen thin almost cachectic appearnce.   Neurologic Cranial Nerve exam:- CN III-XII intact(No nystagmus), symmetric smile. Strength:- 5/5 equal and symmetric strength both upper and lower extremities.      Assessment & Plan:  You have hx of severe low potassium, pancreatitis and malnutrition.  Presenting Friday afternoon close to 4:00.  Your EKG showed normal sinus rhythm.   Discussed your presentation and recent ED visit history with Dr. Etter Sjogren.  Will get CMP, CBC, amylase and lipase stat.  I suspect that your potassium will be low since you have not been able to take oral potassium that was given to you.  Also reports poor oral intake.  We will let you know stat lab results.  If your potassium is even  minimally low will be advising you to be seen in the emergency department.  Please keep your cell phone on and volume up.  Also keep an eye out on MyChart.  Follow-up date to be determined after lab review as well as after possible ED evaluation.  Time spent with patient today was 45  minutes which consisted of chart review, discussing diagnoses, work up, potential treatment, discussing case with Dr. Etter Sjogren, answering pt questions and documentation.  Phone numbers [phone]

## 2020-03-19 NOTE — Patient Instructions (Addendum)
You have hx of severe low potassium, pancreatitis and malnutrition.  Presenting Friday afternoon close to 4:00.  Your EKG showed normal sinus rhythm. On review no low k changes.   Discussed your presentation and recent ED visit history with Dr. Etter Sjogren.  Will get CMP, CBC, amylase and lipase stat.  I suspect that your potassium will be low since you have not been able to take oral potassium that was given to you.  Also reports poor oral intake.  We will let you know stat lab results.  If your potassium is even minimally low will be advising you to be seen in the emergency department.  Please keep your cell phone on and volume up.  Also keep an eye out on MyChart.  Follow-up date to be determined after lab review as well as after possible ED evaluation.  9 pm checking on stat labs ordered at 4 pm today. Not back yet. Tried to call lab and stated closed. So did not get answer on delay/result. Called pt and mom(she was with [name] today in office). Explained not sure why delay. Typically would be ready by now. If no lab result by tomorrow morning then will call/advise ED evaluation at [facility].  Called pt and mom again on 03-20-2020 8 am. Labs still not back yet. Advised mom (who was with patient yesterday) to have [name] be seen at [facility] ED now. Mother agreed with take to ED now.

## 2020-03-20 ENCOUNTER — Encounter (HOSPITAL_BASED_OUTPATIENT_CLINIC_OR_DEPARTMENT_OTHER): Payer: Self-pay | Admitting: Emergency Medicine

## 2020-03-20 ENCOUNTER — Emergency Department (HOSPITAL_BASED_OUTPATIENT_CLINIC_OR_DEPARTMENT_OTHER)
Admission: EM | Admit: 2020-03-20 | Discharge: 2020-03-20 | Disposition: A | Discharge status: Home / Self Care | Payer: BC Managed Care – PPO | Attending: Emergency Medicine | Admitting: Emergency Medicine

## 2020-03-20 DIAGNOSIS — R519 Headache, unspecified: Secondary | ICD-10-CM | POA: Diagnosis not present

## 2020-03-20 DIAGNOSIS — J45909 Unspecified asthma, uncomplicated: Secondary | ICD-10-CM | POA: Insufficient documentation

## 2020-03-20 DIAGNOSIS — I10 Essential (primary) hypertension: Secondary | ICD-10-CM | POA: Diagnosis not present

## 2020-03-20 DIAGNOSIS — J441 Chronic obstructive pulmonary disease with (acute) exacerbation: Secondary | ICD-10-CM | POA: Diagnosis not present

## 2020-03-20 DIAGNOSIS — F17210 Nicotine dependence, cigarettes, uncomplicated: Secondary | ICD-10-CM | POA: Insufficient documentation

## 2020-03-20 DIAGNOSIS — Z950 Presence of cardiac pacemaker: Secondary | ICD-10-CM | POA: Diagnosis not present

## 2020-03-20 DIAGNOSIS — E876 Hypokalemia: Secondary | ICD-10-CM | POA: Insufficient documentation

## 2020-03-20 DIAGNOSIS — R000 Tachycardia, unspecified: Secondary | ICD-10-CM | POA: Diagnosis not present

## 2020-03-20 LAB — COMPREHENSIVE METABOLIC PANEL
ALT: 15 U/L (ref 0–44)
AST: 28 U/L (ref 15–41)
Albumin: 3.8 g/dL (ref 3.5–5.0)
Alkaline Phosphatase: 80 U/L (ref 38–126)
Anion gap: 10 (ref 5–15)
BUN: 7 mg/dL (ref 6–20)
CO2: 22 mmol/L (ref 22–32)
Calcium: 9 mg/dL (ref 8.9–10.3)
Chloride: 105 mmol/L (ref 98–111)
Creatinine, Ser: 0.7 mg/dL (ref 0.44–1.00)
GFR, Estimated: >60 mL/min (ref >60)
Glucose, Bld: 75 mg/dL (ref 70–99)
Potassium: 3.7 mmol/L (ref 3.5–5.1)
Sodium: 137 mmol/L (ref 135–145)
Total Bilirubin: 0.3 mg/dL (ref 0.3–1.2)
Total Protein: 7.3 g/dL (ref 6.5–8.1)

## 2020-03-20 LAB — CBC WITH DIFFERENTIAL/PLATELET
Abs Immature Granulocytes: 0.02 10*3/uL (ref 0.00–0.07)
Basophils Absolute: 0.1 10*3/uL (ref 0.0–0.1)
Basophils Relative: 1 %
Eosinophils Absolute: 0.2 10*3/uL (ref 0.0–0.5)
Eosinophils Relative: 2 %
HCT: 37 % (ref 36.0–46.0)
Hemoglobin: 12.4 g/dL (ref 12.0–15.0)
Immature Granulocytes: 0 %
Lymphocytes Relative: 40 %
Lymphs Abs: 2.8 10*3/uL (ref 0.7–4.0)
MCH: 31.4 pg (ref 26.0–34.0)
MCHC: 33.5 g/dL (ref 30.0–36.0)
MCV: 93.7 fL (ref 80.0–100.0)
Monocytes Absolute: 0.4 10*3/uL (ref 0.1–1.0)
Monocytes Relative: 5 %
Neutro Abs: 3.7 10*3/uL (ref 1.7–7.7)
Neutrophils Relative %: 52 %
Platelets: 388 10*3/uL (ref 150–400)
RBC: 3.95 MIL/uL (ref 3.87–5.11)
RDW: 12.3 % (ref 11.5–15.5)
WBC: 7.1 10*3/uL (ref 4.0–10.5)
nRBC: 0 % (ref 0.0–0.2)

## 2020-03-20 LAB — MAGNESIUM: Magnesium: 1.9 mg/dL (ref 1.7–2.4)

## 2020-03-20 LAB — TSH: TSH: 1.727 u[IU]/mL (ref 0.350–4.500)

## 2020-03-20 MED ORDER — DIPHENHYDRAMINE HCL 25 MG PO CAPS
50.0000 mg | ORAL_CAPSULE | Freq: Once | ORAL | Status: AC
  Administered 2020-03-20: 50 mg via ORAL
  Filled 2020-03-20: qty 2

## 2020-03-20 MED ORDER — KETOROLAC TROMETHAMINE 15 MG/ML IJ SOLN
15.0000 mg | Freq: Once | INTRAMUSCULAR | Status: DC
  Filled 2020-03-20: qty 1

## 2020-03-20 MED ORDER — SODIUM CHLORIDE 0.9 % IV BOLUS
1000.0000 mL | Freq: Once | INTRAVENOUS | Status: AC
  Administered 2020-03-20: 1000 mL via INTRAVENOUS

## 2020-03-20 MED ORDER — PROCHLORPERAZINE MALEATE 10 MG PO TABS: 10.0000 mg | ORAL_TABLET | ORAL | 0 refills | Status: DC | PRN

## 2020-03-20 MED ORDER — PROCHLORPERAZINE EDISYLATE 10 MG/2ML IJ SOLN
10.0000 mg | Freq: Once | INTRAMUSCULAR | Status: AC
  Administered 2020-03-20: 10 mg via INTRAVENOUS
  Filled 2020-03-20: qty 2

## 2020-03-20 NOTE — ED Triage Notes (Signed)
Was told to come in ref low potassium. Endorses headache.

## 2020-03-20 NOTE — ED Notes (Signed)
States she feels much better and ready to go home, ED PA at bedside to discuss Rx for home use

## 2020-03-20 NOTE — ED Notes (Signed)
Pt endorses allergy to toradol., reports causes tachycardia and hives. EDP notified

## 2020-03-20 NOTE — Discharge Instructions (Signed)
Continue taking home medications as prescribed. Make sure you are staying well-hydrated with water. Make sure you are eating regular meals to help keep your potassium up. Use Compazine as needed for severe headache or migraine. Follow-up with your primary care doctor as needed for recheck of your symptoms. Return to the emergency room with any new, worsening, or concerning symptoms

## 2020-03-20 NOTE — ED Notes (Signed)
Presents with HA, very photosensitive, states has been seeing her primary MD regards to low potassium. Labs work obtained per EDP orders, IV established, comfort measures provided

## 2020-03-20 NOTE — ED Provider Notes (Signed)
[facility] Provider Note   CSN: [id number] Arrival date & time: 03/20/20  1102     History Chief Complaint  Patient presents with  . Abnormal Lab  . Headache    [name] is a 44 y.o. female presenting for concern for hypokalemia and headache.  Patient states she was in the ED last week for severe cramps.  Was found to be hypokalemic, replenished with IV potassium.  She states she is unable to tolerate any form of p.o. potassium.  She reports she does not have good p.o. intake at baseline.  She saw her PCP yesterday, there is concern that her potassium was low again, and labs were drawn.  Unfortunately they have not resulted, and pt was instructed to come to the ED. patient reports a migraine headache, has a history of the same and is normal for her.  She has not taken anything for it.  She states she is not taking any potassium p.o. due to intolerance.  She is concerned that her headache is all over body pain is due to low potassium again.  She denies fevers, chills, head trauma, nausea, vomiting, abdominal pain.  HPI     Past Medical History:  Diagnosis Date  . 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 Anal fissure 03/11/2009  . Anemia   . Anxiety   . Anxiety and depression   . ARTHRITIS 08/31/2007  . Arthritis   . Asthma   . 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 Bipolar 1 disorder ([place])   . Bowel obstruction ([place])   . Breakdown of urinary electronic stimulator device, init ([place])   . BRONCHITIS, RECURRENT 08/23/2009   Asthmatic Bronchitis-Dr. Melvyn Novas.....-HFA 75% 12/04/2008>75% 02/05/2009>75% 08/04/2009 -PFT's 01/04/2009 2.56 (86%) ratio 75, no resp to B2 and DLC0 67% > 80 after correction   . Cancer (HCC)    cervical cancer  . Chronic interstitial cystitis 03/11/2009  . Chronic nausea   . Chronic pain   . Colon polyps   . COLONIC POLYPS, HX OF 07/25/2006   ADENOMATOUS POLYP  . COPD (chronic obstructive pulmonary disease) ([place])   . DEPRESSION  08/31/2007  . Endometriosis   . FIBROMYALGIA 08/31/2007  . Fibromyalgia   . GERD 02/05/2009  . Hyperlipidemia   . HYPERTENSION 08/31/2007  . IBS 03/11/2009  . Internal hemorrhoids   . Migraine headache   . 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 PONV (postoperative nausea and vomiting)   . RECTAL BLEEDING 03/11/2009  . Seizures ([place])    been about 1 year since last seisure per pt  . SLEEP APNEA 08/31/2007  . Substance abuse ([place])   . Thyroid disease   . Uterine cyst     Patient Active Problem List   Diagnosis Date Noted  . Encounter by telehealth for suspected COVID-19 03/20/2019  . Abdominal pain, epigastric 10/07/2018  . Intractable vomiting 08/09/2018  . Compression fracture of fifth lumbar vertebra (HCC) 02/07/2018  . Altered mental status 02/07/2018  . Generalized weakness 01/24/2018  . MDD (major depressive disorder) 04/23/2017  . Substance abuse ([place]) 04/19/2017  . Hyperthyroidism 02/17/2015  . Anxiety 01/28/2015  . Clinical depression 01/28/2015  . COPD with acute exacerbation ([place]) 11/26/2013  . Vaginitis and vulvovaginitis 11/26/2013  . Recurrent pneumonia 02/02/2013  . Left arm pain 01/13/2013  . Left arm pain 01/13/2013    Class: Acute  . Dizziness and giddiness 01/13/2013  . Acute bronchitis 12/07/2012  . Rib pain 12/07/2012  . Insomnia 08/26/2012  . Diarrhea 06/19/2012  .  Heme positive stool 06/19/2012  . Bloating 06/19/2012  . Nausea alone 06/19/2012  . Bladder retention 04/24/2012  . Tremor 03/05/2012  . Dysuria 03/05/2012  . Failure to thrive in adult 06/21/2011  . Airway hyperreactivity 03/08/2011  . Back pain, chronic 03/08/2011  . Chronic obstructive pulmonary disease ([place]) 03/08/2011  . Acid reflux 03/08/2011  . Cystitis 01/22/2011  . Bilateral hand numbness 01/13/2011  . WEIGHT LOSS 06/24/2010  . RIB PAIN, LEFT SIDED 05/04/2010  . ABDOMINAL PAIN, LEFT UPPER QUADRANT 05/04/2010  . Unspecified otitis media 10/26/2009  . BRONCHITIS, RECURRENT 08/23/2009   . URI, ACUTE 08/04/2009  . FATIGUE 07/06/2009  . IBS 03/11/2009  . ANAL FISSURE 03/11/2009  . RECTAL BLEEDING 03/11/2009  . Chronic interstitial cystitis 03/11/2009  . COLONIC POLYPS, HX OF 03/11/2009  . GERD 02/05/2009  . SMOKER 12/04/2008  . chronic asthma poorly controlled 12/04/2008  . 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 Bipolar disorder ([place]) 08/31/2007  . HYPERTENSION 08/31/2007  . EMPHYSEMA 08/31/2007  . 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 ARTHRITIS 08/31/2007  . FIBROMYALGIA 08/31/2007  . SLEEP APNEA 08/31/2007    Past Surgical History:  Procedure Laterality Date  . ABDOMINAL HYSTERECTOMY    . BIOPSY  10/25/2018   Procedure: BIOPSY;  Surgeon: Rogene Houston, MD;  Location: [facility];  Service: Endoscopy;;  gastric  . bladder stretching x6    . BLADDER SURGERY     stimulator placed and stretching   . CHOLECYSTECTOMY    . COLONOSCOPY    . COLONOSCOPY WITH PROPOFOL N/A 09/03/2014   Procedure: COLONOSCOPY WITH PROPOFOL;  Surgeon: Milus Banister, MD;  Location: [facility];  Service: Endoscopy;  Laterality: N/A;  . ESOPHAGOGASTRODUODENOSCOPY (EGD) WITH PROPOFOL N/A 10/25/2018   Procedure: ESOPHAGOGASTRODUODENOSCOPY (EGD) WITH PROPOFOL;  Surgeon: Rogene Houston, MD;  Location: [facility];  Service: Endoscopy;  Laterality: N/A;  11:15  . interstitial cystitis    . PACEMAKER INSERTION     in hip for interstitial cystitis  . removal of uterine cyst and scrapped uterus    . replaced bladder pacemaker       OB History    Gravida  3   Para  2   Term      Preterm      AB  1   Living  2     SAB  1   IAB  0   Ectopic  0   Multiple  0   Live Births  2           Family History  Problem Relation Age of Onset  . Heart disease Father   . Asthma Maternal Grandmother   . Emphysema Maternal Grandfather   . Cancer Maternal Grandfather        Lung Cancer  . Cancer Other        Lung Cancer-Aunt  . Colon  cancer Neg Hx   . Esophageal cancer Neg Hx   . Rectal cancer Neg Hx   . Stomach cancer Neg Hx   . Thyroid disease Neg Hx     Social History   Tobacco Use  . Smoking status: Current Every Day Smoker    Packs/day: 0.50    Years: 14.00    Pack years: 7.00    Types: Cigarettes  . Smokeless tobacco: Never Used  Vaping Use  . Vaping Use: Never used  Substance Use Topics  . Alcohol use: Not Currently    Comment: last drink 3  weeks ago; recently released from rehab  . Drug use: Not Currently    Types: Marijuana    Comment: once a month    Home Medications Prior to Admission medications   Medication Sig Start Date End Date Taking? Authorizing Provider  acetaminophen (TYLENOL) 500 MG tablet Take 1,000 mg by mouth every 6 (six) hours as needed.    [provider]  acetaminophen-codeine (TYLENOL #4) 300-60 MG tablet Take 1 tablet by mouth every 4 (four) hours as needed for moderate pain.    [provider]  albuterol (PROAIR HFA) 108 (90 Base) MCG/ACT inhaler INHALE 2 PUFFS EVERY 6 HOURS AS NEEDED FOR SHORTNESS OF BREATH AND WHEEZING. 02/27/20   Carollee Herter, Alferd Apa, DO  budesonide-formoterol (SYMBICORT) 80-4.5 MCG/ACT inhaler Inhale 2 puffs into the lungs 2 (two) times daily. 08/30/19   Saguier, Percell Miller, PA-C  clonazePAM (KLONOPIN) 0.5 MG tablet Take 1 tablet (0.5 mg total) by mouth 3 (three) times daily as needed for anxiety. 05/01/19   [name], DO  estradiol (ESTRACE) 0.1 MG/GM vaginal cream Place 1 Applicatorful vaginally See admin instructions. Every other night for vaginal dryness 11/21/19   [provider]  fluticasone (FLONASE) 50 MCG/ACT nasal spray Place 2 sprays into both nostrils daily. Patient taking differently: Place 2 sprays into both nostrils daily as needed for allergies. 04/22/19   [name], DO  gabapentin (NEURONTIN) 300 MG capsule Take 1 capsule (300 mg total) by mouth 2 (two) times daily. For agitation Patient taking  differently: Take 300 mg by mouth 3 (three) times daily. 01/25/18   Barton Dubois, MD  hydrOXYzine (ATARAX/VISTARIL) 25 MG tablet Take 25 mg by mouth at bedtime.  08/26/18   [provider]  Hyoscyamine Sulfate SL 0.125 MG SUBL Take 1 tablet by mouth every 4 (four) hours as needed (for cramping).  11/04/19   [provider]  lamoTRIgine (LAMICTAL) 100 MG tablet Take 100 mg by mouth 2 (two) times daily. 02/16/20   [provider]  mirtazapine (REMERON) 45 MG tablet Take 22.5 mg by mouth at bedtime.  12/13/18   [provider]  omeprazole (PRILOSEC) 40 MG capsule TAKE 1 CAPSULE BY MOUTH ONCE DAILY. 12/11/19   Carollee Herter, Alferd Apa, DO  oxybutynin (DITROPAN XL) 15 MG 24 hr tablet Take 15 mg by mouth daily. 09/16/19   [provider]  oxybutynin (DITROPAN) 5 MG tablet Take 5 mg by mouth every 8 (eight) hours as needed for bladder spasms.    [provider]  potassium chloride SA (KLOR-CON) 20 MEQ tablet Take 1 tablet (20 mEq total) by mouth 2 (two) times daily. 03/12/20   Triplett, Tammy, PA-C  prochlorperazine (COMPAZINE) 10 MG tablet Take 1 tablet (10 mg total) by mouth as needed (headache). 03/20/20   Latravis Grine, PA-C  promethazine (PHENERGAN) 25 MG tablet Take 1 tablet (25 mg total) by mouth every 6 (six) hours as needed for nausea or vomiting. 02/26/20   [name], DO  QUEtiapine (SEROQUEL) 400 MG tablet Take 400 mg by mouth at bedtime.  09/16/18   [provider]  RESTASIS 0.05 % ophthalmic emulsion  02/26/20   [provider]  topiramate (TOPAMAX) 50 MG tablet Take 1 tablet (50 mg total) by mouth 2 (two) times daily. 03/20/19   [name], DO    Allergies    Abilify [aripiprazole], Amitriptyline, Metoclopramide hcl, Propoxyphene, Toradol [ketorolac tromethamine], Tramadol, Ambien [zolpidem tartrate], Eszopiclone, Codeine, Varenicline, Buprenorphine hcl, Demerol [  meperidine], Emetrol, and Morphine and  related  Review of Systems   Review of Systems  Neurological: Positive for headaches.  All other systems reviewed and are negative.   Physical Exam Updated Vital Signs BP 95/67 (BP Location: Left Arm)   Pulse 100   Temp 98.4 F (36.9 C)   Resp 14   Ht 5\' 2"  (1.575 m)   Wt 32.2 kg   SpO2 100%   BMI 12.99 kg/m   Physical Exam Vitals and nursing note reviewed.  Constitutional:      General: She is not in acute distress.    Appearance: She is well-nourished. She is cachectic.  HENT:     Head: Normocephalic and atraumatic.  Eyes:     Extraocular Movements: Extraocular movements intact and EOM normal.     Conjunctiva/sclera: Conjunctivae normal.     Pupils: Pupils are equal, round, and reactive to light.  Cardiovascular:     Rate and Rhythm: Regular rhythm. Tachycardia present.     Pulses: Normal pulses and intact distal pulses.     Comments: Mildly tachycardic around 105 Pulmonary:     Effort: Pulmonary effort is normal. No respiratory distress.     Breath sounds: Normal breath sounds. No wheezing.  Abdominal:     General: There is no distension.     Palpations: Abdomen is soft. There is no mass.     Tenderness: There is no abdominal tenderness. There is no guarding or rebound.  Musculoskeletal:        General: Normal range of motion.     Cervical back: Normal range of motion and neck supple.  Skin:    General: Skin is warm and dry.     Capillary Refill: Capillary refill takes less than 2 seconds.  Neurological:     Mental Status: She is alert and oriented to person, place, and time.  Psychiatric:        Mood and Affect: Mood and affect normal.     ED Results / Procedures / Treatments   Labs (all labs ordered are listed, but only abnormal results are displayed) Labs Reviewed  CBC WITH DIFFERENTIAL/PLATELET  COMPREHENSIVE METABOLIC PANEL  MAGNESIUM  TSH    EKG EKG Interpretation  Date/Time:  Saturday March 20 2020 11:53:18 EST Ventricular Rate:   94 PR Interval:    QRS Duration: 71 QT Interval:  355 QTC Calculation: 444 R Axis:   74 Text Interpretation: Sinus rhythm No significant change since last tracing Confirmed by Dorie Rank [phone]) on 03/20/2020 11:56:53 AM   Radiology No results found.  Procedures Procedures (including critical care time)  Medications Ordered in ED Medications  ketorolac (TORADOL) 15 MG/ML injection 15 mg (15 mg Intravenous Not Given 03/20/20 1439)  sodium chloride 0.9 % bolus 1,000 mL (0 mLs Intravenous Stopped 03/20/20 1551)  prochlorperazine (COMPAZINE) injection 10 mg (10 mg Intravenous Given 03/20/20 1512)  diphenhydrAMINE (BENADRYL) capsule 50 mg (50 mg Oral Given 03/20/20 1512)    ED Course  I have reviewed the triage vital signs and the nursing notes.  Pertinent labs & imaging results that were available during my care of the patient were reviewed by me and considered in my medical decision making (see chart for details).    MDM Rules/Calculators/A&P                          Patient presenting for evaluation of headache and concerns for low potassium.  On exam, patient appears chronically ill,  she is cachectic.  However she does not appear in acute distress today.  She is mildly tachycardic, has a history of similar.  Has had decreased p.o. intake, this is likely the cause.  Will check labs including potassium and mag.  Labs interpreted by me, overall reassuring.  Potassium normal.  Mag normal.  Fluids given which improved patient's heart rate.  Compazine given for headache.   On reassessment, she reports headache is improved.  She states she feels well enough to go home.  I discussed importance of follow-up with PCP as needed for further evaluation.  At this time, patient appears safe for discharge. Return precautions given.  Patient states she understands and agrees to plan.  Final Clinical Impression(s) / ED Diagnoses Final diagnoses:  Bad headache    Rx / DC Orders ED Discharge  Orders         Ordered    prochlorperazine (COMPAZINE) 10 MG tablet  As needed        03/20/20 1552           Franchot Heidelberg, PA-C 03/20/20 1621    Dorie Rank, MD 03/21/20 [phone]

## 2020-03-22 ENCOUNTER — Telehealth: Payer: Self-pay | Admitting: Medical

## 2020-03-22 ENCOUNTER — Other Ambulatory Visit: Payer: Self-pay | Admitting: Family Medicine

## 2020-03-22 LAB — CBC WITH DIFFERENTIAL/PLATELET
Absolute Monocytes: 425 cells/uL (ref 200–950)
Basophils Absolute: 94 cells/uL (ref 0–200)
Basophils Relative: 1.1 %
Eosinophils Absolute: 289 cells/uL (ref 15–500)
Eosinophils Relative: 3.4 %
HCT: 36.8 % (ref 35.0–45.0)
Hemoglobin: 12.4 g/dL (ref 11.7–15.5)
Lymphs Abs: 4412 cells/uL — ABNORMAL HIGH (ref 850–3900)
MCH: 31.9 pg (ref 27.0–33.0)
MCHC: 33.7 g/dL (ref 32.0–36.0)
MCV: 94.6 fL (ref 80.0–100.0)
MPV: 10.1 fL (ref 7.5–12.5)
Monocytes Relative: 5 %
Neutro Abs: 3281 cells/uL (ref 1500–7800)
Neutrophils Relative %: 38.6 %
Platelets: 423 10*3/uL — ABNORMAL HIGH (ref 140–400)
RBC: 3.89 10*6/uL (ref 3.80–5.10)
RDW: 12 % (ref 11.0–15.0)
Total Lymphocyte: 51.9 %
WBC: 8.5 10*3/uL (ref 3.8–10.8)

## 2020-03-22 LAB — COMPREHENSIVE METABOLIC PANEL
AG Ratio: 1.5 (calc) (ref 1.0–2.5)
ALT: 12 U/L (ref 6–29)
AST: 31 U/L — ABNORMAL HIGH (ref 10–30)
Albumin: 4.4 g/dL (ref 3.6–5.1)
Alkaline phosphatase (APISO): 97 U/L (ref 31–125)
BUN: 8 mg/dL (ref 7–25)
CO2: 29 mmol/L (ref 20–32)
Calcium: 9.7 mg/dL (ref 8.6–10.2)
Chloride: 103 mmol/L (ref 98–110)
Creat: 0.87 mg/dL (ref 0.50–1.10)
Globulin: 3 g/dL (calc) (ref 1.9–3.7)
Glucose, Bld: 77 mg/dL (ref 65–99)
Potassium: 4.1 mmol/L (ref 3.5–5.3)
Sodium: 139 mmol/L (ref 135–146)
Total Bilirubin: 0.3 mg/dL (ref 0.2–1.2)
Total Protein: 7.4 g/dL (ref 6.1–8.1)

## 2020-03-22 LAB — MAGNESIUM: Magnesium: 2 mg/dL (ref 1.5–2.5)

## 2020-03-22 LAB — AMYLASE: Amylase: 62 U/L (ref 21–101)

## 2020-03-22 LAB — LIPASE: Lipase: 21 U/L (ref 7–60)

## 2020-03-22 NOTE — Telephone Encounter (Signed)
Opened review.

## 2020-03-23 ENCOUNTER — Emergency Department (HOSPITAL_BASED_OUTPATIENT_CLINIC_OR_DEPARTMENT_OTHER): Payer: BC Managed Care – PPO

## 2020-03-23 ENCOUNTER — Other Ambulatory Visit: Payer: Self-pay

## 2020-03-23 ENCOUNTER — Ambulatory Visit: Payer: BC Managed Care – PPO | Admitting: Medical

## 2020-03-23 ENCOUNTER — Emergency Department (HOSPITAL_BASED_OUTPATIENT_CLINIC_OR_DEPARTMENT_OTHER)
Admission: EM | Admit: 2020-03-23 | Discharge: 2020-03-23 | Disposition: A | Discharge status: Home / Self Care | Payer: BC Managed Care – PPO | Attending: Emergency Medicine | Admitting: Emergency Medicine

## 2020-03-23 ENCOUNTER — Encounter (HOSPITAL_BASED_OUTPATIENT_CLINIC_OR_DEPARTMENT_OTHER): Payer: Self-pay | Admitting: *Deleted

## 2020-03-23 DIAGNOSIS — J45909 Unspecified asthma, uncomplicated: Secondary | ICD-10-CM | POA: Insufficient documentation

## 2020-03-23 DIAGNOSIS — F17210 Nicotine dependence, cigarettes, uncomplicated: Secondary | ICD-10-CM | POA: Diagnosis not present

## 2020-03-23 DIAGNOSIS — I10 Essential (primary) hypertension: Secondary | ICD-10-CM | POA: Insufficient documentation

## 2020-03-23 DIAGNOSIS — Z79899 Other long term (current) drug therapy: Secondary | ICD-10-CM | POA: Diagnosis not present

## 2020-03-23 DIAGNOSIS — R339 Retention of urine, unspecified: Secondary | ICD-10-CM | POA: Diagnosis not present

## 2020-03-23 DIAGNOSIS — M7989 Other specified soft tissue disorders: Secondary | ICD-10-CM | POA: Diagnosis not present

## 2020-03-23 DIAGNOSIS — Z950 Presence of cardiac pacemaker: Secondary | ICD-10-CM | POA: Insufficient documentation

## 2020-03-23 DIAGNOSIS — S6991XA Unspecified injury of right wrist, hand and finger(s), initial encounter: Secondary | ICD-10-CM | POA: Diagnosis not present

## 2020-03-23 DIAGNOSIS — S42201A Unspecified fracture of upper end of right humerus, initial encounter for closed fracture: Secondary | ICD-10-CM | POA: Insufficient documentation

## 2020-03-23 DIAGNOSIS — W01198A Fall on same level from slipping, tripping and stumbling with subsequent striking against other object, initial encounter: Secondary | ICD-10-CM | POA: Diagnosis not present

## 2020-03-23 DIAGNOSIS — Z8541 Personal history of malignant neoplasm of cervix uteri: Secondary | ICD-10-CM | POA: Diagnosis not present

## 2020-03-23 DIAGNOSIS — S59901A Unspecified injury of right elbow, initial encounter: Secondary | ICD-10-CM | POA: Diagnosis not present

## 2020-03-23 DIAGNOSIS — S42291A Other displaced fracture of upper end of right humerus, initial encounter for closed fracture: Secondary | ICD-10-CM | POA: Diagnosis not present

## 2020-03-23 DIAGNOSIS — M25511 Pain in right shoulder: Secondary | ICD-10-CM | POA: Diagnosis not present

## 2020-03-23 DIAGNOSIS — S42294A Other nondisplaced fracture of upper end of right humerus, initial encounter for closed fracture: Secondary | ICD-10-CM | POA: Diagnosis not present

## 2020-03-23 DIAGNOSIS — W228XXA Striking against or struck by other objects, initial encounter: Secondary | ICD-10-CM | POA: Insufficient documentation

## 2020-03-23 DIAGNOSIS — J449 Chronic obstructive pulmonary disease, unspecified: Secondary | ICD-10-CM | POA: Diagnosis not present

## 2020-03-23 DIAGNOSIS — S4991XA Unspecified injury of right shoulder and upper arm, initial encounter: Secondary | ICD-10-CM | POA: Diagnosis not present

## 2020-03-23 DIAGNOSIS — W19XXXA Unspecified fall, initial encounter: Secondary | ICD-10-CM

## 2020-03-23 MED ORDER — HYDROCODONE-ACETAMINOPHEN 5-325 MG PO TABS
1.0000 | ORAL_TABLET | Freq: Once | ORAL | Status: AC
  Administered 2020-03-23: 1 via ORAL
  Filled 2020-03-23: qty 1

## 2020-03-23 MED ORDER — HYDROCODONE-ACETAMINOPHEN 5-325 MG PO TABS: 2.0000 | ORAL_TABLET | ORAL | 0 refills | Status: AC | PRN

## 2020-03-23 MED ORDER — HYDROMORPHONE HCL 1 MG/ML IJ SOLN
1.0000 mg | Freq: Once | INTRAMUSCULAR | Status: AC
  Administered 2020-03-23: 1 mg via INTRAMUSCULAR
  Filled 2020-03-23: qty 1

## 2020-03-23 MED ORDER — ONDANSETRON 4 MG PO TBDP
4.0000 mg | ORAL_TABLET | Freq: Once | ORAL | Status: AC
  Administered 2020-03-23: 4 mg via ORAL
  Filled 2020-03-23: qty 1

## 2020-03-23 MED ORDER — METHOCARBAMOL 500 MG PO TABS: 500.0000 mg | ORAL_TABLET | Freq: Two times a day (BID) | ORAL | 0 refills | Status: AC

## 2020-03-23 NOTE — ED Notes (Signed)
Patient transported to X-ray 

## 2020-03-23 NOTE — ED Provider Notes (Signed)
[facility] DEPARTMENT Provider Note   CSN: [id number] Arrival date & time: 03/23/20  1349     History Chief Complaint  Patient presents with  . Shoulder Injury    [name] is a 44 y.o. female.  44 y.o female with an extensive PMH of pretension, is of addiction, COPD, MDD presents to the ED status post fall.  Patient was ambulating her apartment when it suddenly pulled her, causing the leash to wrap around her right shoulder, she has not taken a medication for improvement in her symptoms.  She does endorse pain radiating from her right shoulder down to her right wrist, unsure whether she struck her wrist as well with the fall.  Patient did place her right shoulder in a sling. Denies striking her head, no LOC, no anticoags, no other complaints.   The history is provided by the patient.  Shoulder Injury This is a new problem. The current episode started 1 to 2 hours ago. The problem occurs constantly. The problem has been gradually improving. Pertinent negatives include no chest pain, no abdominal pain, no headaches and no shortness of breath. The symptoms are aggravated by standing.       Past Medical History:  Diagnosis Date  . 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 Anal fissure 03/11/2009  . Anemia   . Anxiety   . Anxiety and depression   . ARTHRITIS 08/31/2007  . Arthritis   . Asthma   . 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 Bipolar 1 disorder ([place])   . Bowel obstruction ([place])   . Breakdown of urinary electronic stimulator device, init ([place])   . BRONCHITIS, RECURRENT 08/23/2009   Asthmatic Bronchitis-Dr. Melvyn Novas.....-HFA 75% 12/04/2008>75% 02/05/2009>75% 08/04/2009 -PFT's 01/04/2009 2.56 (86%) ratio 75, no resp to B2 and DLC0 67% > 80 after correction   . Cancer (HCC)    cervical cancer  . Chronic interstitial cystitis 03/11/2009  . Chronic nausea   . Chronic pain   . Colon polyps   . COLONIC POLYPS, HX OF 07/25/2006   ADENOMATOUS POLYP  . COPD  (chronic obstructive pulmonary disease) ([place])   . DEPRESSION 08/31/2007  . Endometriosis   . FIBROMYALGIA 08/31/2007  . Fibromyalgia   . GERD 02/05/2009  . Hyperlipidemia   . HYPERTENSION 08/31/2007  . IBS 03/11/2009  . Internal hemorrhoids   . Migraine headache   . 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 PONV (postoperative nausea and vomiting)   . RECTAL BLEEDING 03/11/2009  . Seizures ([place])    been about 1 year since last seisure per pt  . SLEEP APNEA 08/31/2007  . Substance abuse ([place])   . Thyroid disease   . Uterine cyst     Patient Active Problem List   Diagnosis Date Noted  . Encounter by telehealth for suspected COVID-19 03/20/2019  . Abdominal pain, epigastric 10/07/2018  . Intractable vomiting 08/09/2018  . Compression fracture of fifth lumbar vertebra (HCC) 02/07/2018  . Altered mental status 02/07/2018  . Generalized weakness 01/24/2018  . MDD (major depressive disorder) 04/23/2017  . Substance abuse ([place]) 04/19/2017  . Hyperthyroidism 02/17/2015  . Anxiety 01/28/2015  . Clinical depression 01/28/2015  . COPD with acute exacerbation ([place]) 11/26/2013  . Vaginitis and vulvovaginitis 11/26/2013  . Recurrent pneumonia 02/02/2013  . Left arm pain 01/13/2013  . Left arm pain 01/13/2013    Class: Acute  . Dizziness and giddiness 01/13/2013  . Acute bronchitis 12/07/2012  . Rib pain 12/07/2012  . Insomnia 08/26/2012  . Diarrhea 06/19/2012  .  Heme positive stool 06/19/2012  . Bloating 06/19/2012  . Nausea alone 06/19/2012  . Bladder retention 04/24/2012  . Tremor 03/05/2012  . Dysuria 03/05/2012  . Failure to thrive in adult 06/21/2011  . Airway hyperreactivity 03/08/2011  . Back pain, chronic 03/08/2011  . Chronic obstructive pulmonary disease ([place]) 03/08/2011  . Acid reflux 03/08/2011  . Cystitis 01/22/2011  . Bilateral hand numbness 01/13/2011  . WEIGHT LOSS 06/24/2010  . RIB PAIN, LEFT SIDED 05/04/2010  . ABDOMINAL PAIN, LEFT UPPER QUADRANT 05/04/2010  .  Unspecified otitis media 10/26/2009  . BRONCHITIS, RECURRENT 08/23/2009  . URI, ACUTE 08/04/2009  . FATIGUE 07/06/2009  . IBS 03/11/2009  . ANAL FISSURE 03/11/2009  . RECTAL BLEEDING 03/11/2009  . Chronic interstitial cystitis 03/11/2009  . COLONIC POLYPS, HX OF 03/11/2009  . GERD 02/05/2009  . SMOKER 12/04/2008  . chronic asthma poorly controlled 12/04/2008  . 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 Bipolar disorder ([place]) 08/31/2007  . HYPERTENSION 08/31/2007  . EMPHYSEMA 08/31/2007  . 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 ARTHRITIS 08/31/2007  . FIBROMYALGIA 08/31/2007  . SLEEP APNEA 08/31/2007    Past Surgical History:  Procedure Laterality Date  . ABDOMINAL HYSTERECTOMY    . BIOPSY  10/25/2018   Procedure: BIOPSY;  Surgeon: Rogene Houston, MD;  Location: [facility];  Service: Endoscopy;;  gastric  . bladder stretching x6    . BLADDER SURGERY     stimulator placed and stretching   . CHOLECYSTECTOMY    . COLONOSCOPY    . COLONOSCOPY WITH PROPOFOL N/A 09/03/2014   Procedure: COLONOSCOPY WITH PROPOFOL;  Surgeon: Milus Banister, MD;  Location: [facility];  Service: Endoscopy;  Laterality: N/A;  . ESOPHAGOGASTRODUODENOSCOPY (EGD) WITH PROPOFOL N/A 10/25/2018   Procedure: ESOPHAGOGASTRODUODENOSCOPY (EGD) WITH PROPOFOL;  Surgeon: Rogene Houston, MD;  Location: [facility];  Service: Endoscopy;  Laterality: N/A;  11:15  . interstitial cystitis    . PACEMAKER INSERTION     in hip for interstitial cystitis  . removal of uterine cyst and scrapped uterus    . replaced bladder pacemaker       OB History    Gravida  3   Para  2   Term      Preterm      AB  1   Living  2     SAB  1   IAB  0   Ectopic  0   Multiple  0   Live Births  2           Family History  Problem Relation Age of Onset  . Heart disease Father   . Asthma Maternal Grandmother   . Emphysema Maternal Grandfather   . Cancer Maternal Grandfather         Lung Cancer  . Cancer Other        Lung Cancer-Aunt  . Colon cancer Neg Hx   . Esophageal cancer Neg Hx   . Rectal cancer Neg Hx   . Stomach cancer Neg Hx   . Thyroid disease Neg Hx     Social History   Tobacco Use  . Smoking status: Current Every Day Smoker    Packs/day: 0.50    Years: 14.00    Pack years: 7.00    Types: Cigarettes  . Smokeless tobacco: Never Used  Vaping Use  . Vaping Use: Never used  Substance Use Topics  . Alcohol use: Not Currently    Comment: last drink 3  weeks ago; recently released from rehab  . Drug use: Not Currently    Types: Marijuana    Comment: once a month    Home Medications Prior to Admission medications   Medication Sig Start Date End Date Taking? Authorizing Provider  acetaminophen (TYLENOL) 500 MG tablet Take 1,000 mg by mouth every 6 (six) hours as needed.    [provider]  acetaminophen-codeine (TYLENOL #4) 300-60 MG tablet Take 1 tablet by mouth every 4 (four) hours as needed for moderate pain.    [provider]  albuterol (PROAIR HFA) 108 (90 Base) MCG/ACT inhaler INHALE 2 PUFFS EVERY 6 HOURS AS NEEDED FOR SHORTNESS OF BREATH AND WHEEZING. 02/27/20   Carollee Herter, Alferd Apa, DO  budesonide-formoterol (SYMBICORT) 80-4.5 MCG/ACT inhaler Inhale 2 puffs into the lungs 2 (two) times daily. 08/30/19   Saguier, Percell Miller, PA-C  clonazePAM (KLONOPIN) 0.5 MG tablet Take 1 tablet (0.5 mg total) by mouth 3 (three) times daily as needed for anxiety. 05/01/19   [name], DO  estradiol (ESTRACE) 0.1 MG/GM vaginal cream Place 1 Applicatorful vaginally See admin instructions. Every other night for vaginal dryness 11/21/19   [provider]  fluticasone (FLONASE) 50 MCG/ACT nasal spray Place 2 sprays into both nostrils daily. Patient taking differently: Place 2 sprays into both nostrils daily as needed for allergies. 04/22/19   [name], DO  gabapentin (NEURONTIN) 300 MG capsule Take 1 capsule (300  mg total) by mouth 2 (two) times daily. For agitation Patient taking differently: Take 300 mg by mouth 3 (three) times daily. 01/25/18   Barton Dubois, MD  HYDROcodone-acetaminophen (NORCO/VICODIN) 5-325 MG tablet Take 2 tablets by mouth every 4 (four) hours as needed for up to 3 days. 03/23/20 03/26/20  Janeece Fitting, PA-C  hydrOXYzine (ATARAX/VISTARIL) 25 MG tablet Take 25 mg by mouth at bedtime.  08/26/18   [provider]  Hyoscyamine Sulfate SL 0.125 MG SUBL Take 1 tablet by mouth every 4 (four) hours as needed (for cramping).  11/04/19   [provider]  lamoTRIgine (LAMICTAL) 100 MG tablet Take 100 mg by mouth 2 (two) times daily. 02/16/20   [provider]  methocarbamol (ROBAXIN) 500 MG tablet Take 1 tablet (500 mg total) by mouth 2 (two) times daily for 7 days. 03/23/20 03/30/20  Janeece Fitting, PA-C  mirtazapine (REMERON) 45 MG tablet Take 22.5 mg by mouth at bedtime.  12/13/18   [provider]  omeprazole (PRILOSEC) 40 MG capsule Take 1 capsule (40 mg total) by mouth daily. 03/22/20   [name], DO  oxybutynin (DITROPAN XL) 15 MG 24 hr tablet Take 15 mg by mouth daily. 09/16/19   [provider]  oxybutynin (DITROPAN) 5 MG tablet Take 5 mg by mouth every 8 (eight) hours as needed for bladder spasms.    [provider]  potassium chloride SA (KLOR-CON) 20 MEQ tablet Take 1 tablet (20 mEq total) by mouth 2 (two) times daily. 03/12/20   Triplett, Tammy, PA-C  prochlorperazine (COMPAZINE) 10 MG tablet Take 1 tablet (10 mg total) by mouth as needed (headache). 03/20/20   Caccavale, Sophia, PA-C  promethazine (PHENERGAN) 25 MG tablet Take 1 tablet (25 mg total) by mouth every 6 (six) hours as needed for nausea or vomiting. 02/26/20   [name], DO  QUEtiapine (SEROQUEL) 400 MG tablet Take 400 mg by mouth at bedtime.  09/16/18   [provider]  RESTASIS 0.05 % ophthalmic emulsion  02/26/20  [provider]   topiramate (TOPAMAX) 50 MG tablet Take 1 tablet (50 mg total) by mouth 2 (two) times daily. 03/20/19   [name], DO    Allergies    Abilify [aripiprazole], Amitriptyline, Metoclopramide hcl, Propoxyphene, Toradol [ketorolac tromethamine], Tramadol, Ambien [zolpidem tartrate], Eszopiclone, Codeine, Varenicline, Buprenorphine hcl, Demerol [meperidine], Emetrol, and Morphine and related  Review of Systems   Review of Systems  Constitutional: Negative for fever.  HENT: Negative for sore throat.   Respiratory: Negative for shortness of breath.   Cardiovascular: Negative for chest pain.  Gastrointestinal: Negative for abdominal pain.  Genitourinary: Negative for flank pain.  Musculoskeletal: Positive for arthralgias.  Skin: Negative for pallor and wound.  Neurological: Negative for headaches.  All other systems reviewed and are negative.   Physical Exam Updated Vital Signs BP 107/73 (BP Location: Left Arm)   Pulse 74   Temp 98.4 F (36.9 C) (Oral)   Resp 17   Ht 5\' 2"  (1.575 m)   Wt 37.5 kg   SpO2 98%   BMI 15.13 kg/m   Physical Exam Vitals and nursing note reviewed.  Constitutional:      Appearance: Normal appearance.  HENT:     Head: Normocephalic and atraumatic.     Nose: Nose normal.     Mouth/Throat:     Mouth: Mucous membranes are moist.  Cardiovascular:     Rate and Rhythm: Normal rate.  Pulmonary:     Effort: Pulmonary effort is normal.     Breath sounds: No wheezing or rhonchi.  Abdominal:     General: Abdomen is flat.     Tenderness: There is no abdominal tenderness. There is no right CVA tenderness or left CVA tenderness.  Musculoskeletal:        General: Tenderness and deformity present.     Right shoulder: Deformity and tenderness present. No swelling, effusion, laceration or bony tenderness. Decreased range of motion. Normal strength. Normal pulse.     Cervical back: Normal range of motion and neck supple.     Comments: Pulses present,  sensation is intact. Limited ROM due to pain. Pain with palpation of right humeral head.   Skin:    General: Skin is warm and dry.  Neurological:     Mental Status: She is alert and oriented to person, place, and time.     ED Results / Procedures / Treatments   Labs (all labs ordered are listed, but only abnormal results are displayed) Labs Reviewed - No data to display  EKG None  Radiology DG Shoulder Right  Result Date: 03/23/2020 CLINICAL DATA:  r/o fx EXAM: RIGHT SHOULDER - 2+ VIEW COMPARISON:  None. FINDINGS: Comminuted humeral head and neck fracture without significant displacement. Glenohumeral joint remains approximated. Preserved acromioclavicular and coracoclavicular intervals. Diffuse soft tissue swelling. IMPRESSION: Comminuted, nondisplaced humeral head and neck fracture. Diffuse soft tissue swelling. Electronically Signed   By: Primitivo Gauze M.D.   On: 03/23/2020 14:58   DG Elbow 2 Views Right  Result Date: 03/23/2020 CLINICAL DATA:  44 year old female with fall and trauma to the right elbow. EXAM: RIGHT ELBOW - 2 VIEW COMPARISON:  None. FINDINGS: There is no evidence of fracture, dislocation, or joint effusion. There is no evidence of arthropathy or other focal bone abnormality. Soft tissues are unremarkable. IMPRESSION: Negative. Electronically Signed   By: Anner Crete M.D.   On: 03/23/2020 17:08   DG Wrist Complete Right  Result Date: 03/23/2020 CLINICAL DATA:  44 year old female with fall and  trauma to the right upper extremity. EXAM: RIGHT WRIST - COMPLETE 3+ VIEW COMPARISON:  None. FINDINGS: There is no acute fracture or dislocation. Old ulnar styloid fracture with nonunion. The bones are mildly osteopenic. No significant arthritic changes. The soft tissues are unremarkable. IMPRESSION: No acute fracture or dislocation. Electronically Signed   By: Anner Crete M.D.   On: 03/23/2020 17:07    Procedures Procedures (including critical care  time)  Medications Ordered in ED Medications  HYDROcodone-acetaminophen (NORCO/VICODIN) 5-325 MG per tablet 1 tablet (1 tablet Oral Given 03/23/20 1631)  ondansetron (ZOFRAN-ODT) disintegrating tablet 4 mg (4 mg Oral Given 03/23/20 1632)  HYDROmorphone (DILAUDID) injection 1 mg (1 mg Intramuscular Given 03/23/20 1737)    ED Course  I have reviewed the triage vital signs and the nursing notes.  Pertinent labs & imaging results that were available during my care of the patient were reviewed by me and considered in my medical decision making (see chart for details).    MDM Rules/Calculators/A&P    Patient with extensive past medical history presents to the ED with a chief complaint of right shoulder pain status post fall after walking her dog.  Does have an obvious deformity to the right small humerus.  She is unable to fully range the right shoulder.  There is pain at the elbow along with hand radiating from her right shoulder.  Has not taken any medication for improvement in her symptoms.  She is neurovascularly intact.  Xray of the right shoulder showed: Comminuted, nondisplaced humeral head and neck fracture.    Diffuse soft tissue swelling.     X-ray of the right wrist showed no acute pathology, elbow x-ray is also without any acute fracture or findings. 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  Patient is agreeable of prescription at this time.  Nursing staff at the bedside.  Patient will also be provided with Dilaudid IM prior to discharge from the ED for pain control.   Portions of this note were generated with [employer]. Dictation errors may occur despite best attempts at proofreading.  Final Clinical Impression(s) / ED Diagnoses Final diagnoses:  Fall, initial encounter  Closed fracture of head of right humerus, initial  encounter    Rx / DC Orders ED Discharge Orders         Ordered    HYDROcodone-acetaminophen (NORCO/VICODIN) 5-325 MG tablet  Every 4 hours PRN        03/23/20 1736    methocarbamol (ROBAXIN) 500 MG tablet  2 times daily        03/23/20 1738           Janeece Fitting, PA-C 03/23/20 [place], DO 03/23/20 1739

## 2020-03-23 NOTE — Discharge Instructions (Addendum)
I have provided a referral for Dr. Rolena Infante of orthopedics for further management of your right shoulder fracture.  I have prescribed a short course of pain medication to help with your symptoms, please take this as prescribed.The second medication is a muscle relaxer, this should help with muscle spasm, please take this medication as prescribed, this may cause drowsiness, do not drink a whole or drive while taking this medication.

## 2020-03-23 NOTE — ED Triage Notes (Signed)
Right shoulder injury. Her dog pulled her into a fence pole 3 hours ago.

## 2020-03-24 DIAGNOSIS — J449 Chronic obstructive pulmonary disease, unspecified: Secondary | ICD-10-CM | POA: Diagnosis not present

## 2020-03-24 DIAGNOSIS — F312 Bipolar disorder, current episode manic severe with psychotic features: Secondary | ICD-10-CM | POA: Diagnosis not present

## 2020-03-24 DIAGNOSIS — E46 Unspecified protein-calorie malnutrition: Secondary | ICD-10-CM | POA: Diagnosis not present

## 2020-03-24 DIAGNOSIS — K861 Other chronic pancreatitis: Secondary | ICD-10-CM | POA: Diagnosis not present

## 2020-03-25 DIAGNOSIS — R748 Abnormal levels of other serum enzymes: Secondary | ICD-10-CM | POA: Diagnosis not present

## 2020-03-25 DIAGNOSIS — R970 Elevated carcinoembryonic antigen [CEA]: Secondary | ICD-10-CM | POA: Diagnosis not present

## 2020-03-26 ENCOUNTER — Other Ambulatory Visit: Payer: Self-pay

## 2020-03-26 ENCOUNTER — Ambulatory Visit: Payer: BC Managed Care – PPO | Admitting: Orthopedic Surgery

## 2020-03-26 ENCOUNTER — Encounter: Payer: Self-pay | Admitting: Orthopedic Surgery

## 2020-03-26 VITALS — Ht 62.0 in | Wt 85.6 lb

## 2020-03-26 DIAGNOSIS — S42201A Unspecified fracture of upper end of right humerus, initial encounter for closed fracture: Secondary | ICD-10-CM | POA: Diagnosis not present

## 2020-03-26 MED ORDER — OXYCODONE HCL 5 MG PO TABS: 5.0000 mg | ORAL_TABLET | Freq: Four times a day (QID) | ORAL | 0 refills | Status: AC | PRN

## 2020-03-26 NOTE — Progress Notes (Signed)
New Patient Visit  Assessment: [name] is a 44 y.o. RHD female with the following: Right proximal humerus fracture   Plan: Mrs. Ardoin sustained a right proximal humerus fracture.  We reviewed XR in clinic today which demonstrates some impaction of the anatomic neck and some comminution of the tuberosities.  However, it remains aligned overall.  In particular, the humeral head remains well positioned within the glenoid.  As long as there is no interval displacement, I think this fracture can be treated without surgery.  She is to remain in her sling at all times, with the exception of hygiene.  She stated her understanding.  I have provided her with a limited supply of oxycodone for her current pain.  As her fracture heals, I expect her pain to improve.   Follow-up: Return in about 11 days (around 04/06/2020).   XR right shoulder   Subjective:  Chief Complaint  Patient presents with  . Shoulder Injury    Right shoulder, having a lot of pain today. 03/22/2020. 9/10 today.     History of Present Illness: [name] is a 44 y.o. RHD female who presents for evaluation of right shoulder pain.  3 days ago, she was walking her dog, who suddenly pulled, and caused her to fall.  She noted immediate pain in her right shoulder.  She did place her shoulder in a sling, and presented to the emergency department for evaluation.  No injuries elsewhere.  She continues to have significant pain.  X-rays in the emergency department demonstrates proximal humerus fracture.  Given wearing a sling, she continues to have pain in her shoulder.  No numbness or tingling.   Review of Systems: No fevers or chills No numbness or tingling No chest pain No shortness of breath No bowel or bladder dysfunction No GI distress No headaches   Medical History:  Past Medical History:  Diagnosis Date  . 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 Anal fissure 03/11/2009  . Anemia   . Anxiety   . Anxiety and  depression   . ARTHRITIS 08/31/2007  . Arthritis   . Asthma   . 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 Bipolar 1 disorder ([place])   . Bowel obstruction ([place])   . Breakdown of urinary electronic stimulator device, init ([place])   . BRONCHITIS, RECURRENT 08/23/2009   Asthmatic Bronchitis-Dr. Melvyn Novas.....-HFA 75% 12/04/2008>75% 02/05/2009>75% 08/04/2009 -PFT's 01/04/2009 2.56 (86%) ratio 75, no resp to B2 and DLC0 67% > 80 after correction   . Cancer (HCC)    cervical cancer  . Chronic interstitial cystitis 03/11/2009  . Chronic nausea   . Chronic pain   . Colon polyps   . COLONIC POLYPS, HX OF 07/25/2006   ADENOMATOUS POLYP  . COPD (chronic obstructive pulmonary disease) ([place])   . DEPRESSION 08/31/2007  . Endometriosis   . FIBROMYALGIA 08/31/2007  . Fibromyalgia   . GERD 02/05/2009  . Hyperlipidemia   . HYPERTENSION 08/31/2007  . IBS 03/11/2009  . Internal hemorrhoids   . Migraine headache   . 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 PONV (postoperative nausea and vomiting)   . RECTAL BLEEDING 03/11/2009  . Seizures ([place])    been about 1 year since last seisure per pt  . SLEEP APNEA 08/31/2007  . Substance abuse ([place])   . Thyroid disease   . Uterine cyst     Past Surgical History:  Procedure Laterality Date  . ABDOMINAL HYSTERECTOMY    . BIOPSY  10/25/2018   Procedure: BIOPSY;  Surgeon: Laural Golden,  Mechele Dawley, MD;  Location: [facility];  Service: Endoscopy;;  gastric  . bladder stretching x6    . BLADDER SURGERY     stimulator placed and stretching   . CHOLECYSTECTOMY    . COLONOSCOPY    . COLONOSCOPY WITH PROPOFOL N/A 09/03/2014   Procedure: COLONOSCOPY WITH PROPOFOL;  Surgeon: Milus Banister, MD;  Location: [facility];  Service: Endoscopy;  Laterality: N/A;  . ESOPHAGOGASTRODUODENOSCOPY (EGD) WITH PROPOFOL N/A 10/25/2018   Procedure: ESOPHAGOGASTRODUODENOSCOPY (EGD) WITH PROPOFOL;  Surgeon: Rogene Houston, MD;  Location: [facility];  Service: Endoscopy;  Laterality: N/A;  11:15  .  interstitial cystitis    . PACEMAKER INSERTION     in hip for interstitial cystitis  . removal of uterine cyst and scrapped uterus    . replaced bladder pacemaker      Family History  Problem Relation Age of Onset  . Heart disease Father   . Asthma Maternal Grandmother   . Emphysema Maternal Grandfather   . Cancer Maternal Grandfather        Lung Cancer  . Cancer Other        Lung Cancer-Aunt  . Colon cancer Neg Hx   . Esophageal cancer Neg Hx   . Rectal cancer Neg Hx   . Stomach cancer Neg Hx   . Thyroid disease Neg Hx    Social History   Tobacco Use  . Smoking status: Current Every Day Smoker    Packs/day: 0.50    Years: 14.00    Pack years: 7.00    Types: Cigarettes  . Smokeless tobacco: Never Used  Vaping Use  . Vaping Use: Never used  Substance Use Topics  . Alcohol use: Not Currently    Comment: last drink 3 weeks ago; recently released from rehab  . Drug use: Not Currently    Types: Marijuana    Comment: once a month    Allergies  Allergen Reactions  . Abilify [Aripiprazole] Swelling, Palpitations and Other (See Comments)    Throat swelling, tremors   . Amitriptyline Anaphylaxis    Angioedema  . Metoclopramide Hcl Other (See Comments)    Causes seizures  . Propoxyphene Rash  . Toradol [Ketorolac Tromethamine]   . Tramadol Swelling, Other (See Comments) and Rash    Throat swelling, tremors  . Ambien [Zolpidem Tartrate] Other (See Comments)    hallucinations  . Eszopiclone Other (See Comments)    Hallucinations, hyperactivity, and bad taste in mouth   . Codeine Other (See Comments)    Tylenol #3 caused itching  . Varenicline Other (See Comments)    Suicidal thoughts  . Buprenorphine Hcl Itching and Hives  . Demerol [Meperidine] Rash  . Emetrol Hives, Itching and Rash  . Morphine And Related Hives and Itching    Current Meds  Medication Sig  . acetaminophen (TYLENOL) 500 MG tablet Take 1,000 mg by mouth every 6 (six) hours as needed.  Marland Kitchen  acetaminophen-codeine (TYLENOL #4) 300-60 MG tablet Take 1 tablet by mouth every 4 (four) hours as needed for moderate pain.  Marland Kitchen albuterol (PROAIR HFA) 108 (90 Base) MCG/ACT inhaler INHALE 2 PUFFS EVERY 6 HOURS AS NEEDED FOR SHORTNESS OF BREATH AND WHEEZING.  . budesonide-formoterol (SYMBICORT) 80-4.5 MCG/ACT inhaler Inhale 2 puffs into the lungs 2 (two) times daily.  . clonazePAM (KLONOPIN) 0.5 MG tablet Take 1 tablet (0.5 mg total) by mouth 3 (three) times daily as needed for anxiety.  Marland Kitchen estradiol (ESTRACE) 0.1 MG/GM vaginal cream Place 1  Applicatorful vaginally See admin instructions. Every other night for vaginal dryness  . fluticasone (FLONASE) 50 MCG/ACT nasal spray Place 2 sprays into both nostrils daily. (Patient taking differently: Place 2 sprays into both nostrils daily as needed for allergies.)  . gabapentin (NEURONTIN) 300 MG capsule Take 1 capsule (300 mg total) by mouth 2 (two) times daily. For agitation (Patient taking differently: Take 300 mg by mouth 3 (three) times daily.)  . HYDROcodone-acetaminophen (NORCO/VICODIN) 5-325 MG tablet Take 2 tablets by mouth every 4 (four) hours as needed for up to 3 days.  . hydrOXYzine (ATARAX/VISTARIL) 25 MG tablet Take 25 mg by mouth at bedtime.   Marland Kitchen Hyoscyamine Sulfate SL 0.125 MG SUBL Take 1 tablet by mouth every 4 (four) hours as needed (for cramping).   Marland Kitchen lamoTRIgine (LAMICTAL) 100 MG tablet Take 100 mg by mouth 2 (two) times daily.  . methocarbamol (ROBAXIN) 500 MG tablet Take 1 tablet (500 mg total) by mouth 2 (two) times daily for 7 days.  . mirtazapine (REMERON) 45 MG tablet Take 22.5 mg by mouth at bedtime.   Marland Kitchen omeprazole (PRILOSEC) 40 MG capsule Take 1 capsule (40 mg total) by mouth daily.  Marland Kitchen oxybutynin (DITROPAN XL) 15 MG 24 hr tablet Take 15 mg by mouth daily.  Marland Kitchen oxybutynin (DITROPAN) 5 MG tablet Take 5 mg by mouth every 8 (eight) hours as needed for bladder spasms.  . potassium chloride SA (KLOR-CON) 20 MEQ tablet Take 1 tablet (20 mEq  total) by mouth 2 (two) times daily.  . prochlorperazine (COMPAZINE) 10 MG tablet Take 1 tablet (10 mg total) by mouth as needed (headache).  . promethazine (PHENERGAN) 25 MG tablet Take 1 tablet (25 mg total) by mouth every 6 (six) hours as needed for nausea or vomiting.  Marland Kitchen QUEtiapine (SEROQUEL) 400 MG tablet Take 400 mg by mouth at bedtime.   . RESTASIS 0.05 % ophthalmic emulsion   . topiramate (TOPAMAX) 50 MG tablet Take 1 tablet (50 mg total) by mouth 2 (two) times daily.    Objective: Ht 5\' 2"  (1.575 m)   Wt 85 lb 9.6 oz (38.8 kg)   BMI 15.66 kg/m   Physical Exam:  General: Alert and oriented, in obvious distress. Gait: Normal  Evaluation of the right shoulder demonstrates ecchymosis, particularly within the axilla and the anterior aspect of her arm.  No obvious deformity.  Her arm is held in a sling.  Sensation is intact distally.  Active motion intact in the AIN/PIN and ulnar nerve distributions.  Fingers are warm and well perfused.    IMAGING: I personally reviewed images previously obtained from the ED  X-rays of the right shoulder demonstrates a proximal humerus fracture, with some impaction at the anatomical neck, as well as comminution of the tuberosities.  Overall, the proximal humerus remains well aligned.  There is minimal displacement of the comminuted fragments.  New Medications:  Meds ordered this encounter  Medications  . oxyCODONE (ROXICODONE) 5 MG immediate release tablet    Sig: Take 1 tablet (5 mg total) by mouth every 6 (six) hours as needed for up to 7 days for severe pain.    Dispense:  20 tablet    Refill:  0      Mordecai Rasmussen, MD  03/26/2020 11:12 PM

## 2020-03-29 DIAGNOSIS — F314 Bipolar disorder, current episode depressed, severe, without psychotic features: Secondary | ICD-10-CM | POA: Diagnosis not present

## 2020-03-29 DIAGNOSIS — F411 Generalized anxiety disorder: Secondary | ICD-10-CM | POA: Diagnosis not present

## 2020-04-07 ENCOUNTER — Ambulatory Visit: Payer: BC Managed Care – PPO | Admitting: Orthopedic Surgery

## 2020-04-07 ENCOUNTER — Telehealth: Payer: Self-pay | Admitting: Orthopedic Surgery

## 2020-04-07 NOTE — Telephone Encounter (Signed)
Patient missed her appointment on 04/07/2020 and per Dr. Dallas Schimke she is at risk for further displacement of her right shoulder. She can make a follow up at a later time.

## 2020-04-13 DIAGNOSIS — J449 Chronic obstructive pulmonary disease, unspecified: Secondary | ICD-10-CM | POA: Diagnosis not present

## 2020-04-13 DIAGNOSIS — F312 Bipolar disorder, current episode manic severe with psychotic features: Secondary | ICD-10-CM | POA: Diagnosis not present

## 2020-04-13 DIAGNOSIS — E46 Unspecified protein-calorie malnutrition: Secondary | ICD-10-CM | POA: Diagnosis not present

## 2020-04-13 DIAGNOSIS — K861 Other chronic pancreatitis: Secondary | ICD-10-CM | POA: Diagnosis not present

## 2020-04-13 DIAGNOSIS — M5459 Other low back pain: Secondary | ICD-10-CM | POA: Diagnosis not present

## 2020-04-13 DIAGNOSIS — Z681 Body mass index (BMI) 19 or less, adult: Secondary | ICD-10-CM | POA: Diagnosis not present

## 2020-04-22 ENCOUNTER — Encounter (INDEPENDENT_AMBULATORY_CARE_PROVIDER_SITE_OTHER): Payer: Self-pay | Admitting: *Deleted

## 2020-04-22 DIAGNOSIS — R339 Retention of urine, unspecified: Secondary | ICD-10-CM | POA: Diagnosis not present

## 2020-04-23 ENCOUNTER — Ambulatory Visit: Payer: BC Managed Care – PPO | Admitting: Orthopedic Surgery

## 2020-04-23 DIAGNOSIS — N3010 Interstitial cystitis (chronic) without hematuria: Secondary | ICD-10-CM | POA: Diagnosis not present

## 2020-04-23 NOTE — Telephone Encounter (Signed)
Patient missed her appointment and per the doctor this patient is at risk for a greater fracture.

## 2020-04-28 ENCOUNTER — Encounter: Payer: Self-pay | Admitting: Orthopedic Surgery

## 2020-04-28 ENCOUNTER — Ambulatory Visit: Payer: BC Managed Care – PPO

## 2020-04-28 ENCOUNTER — Ambulatory Visit (INDEPENDENT_AMBULATORY_CARE_PROVIDER_SITE_OTHER): Payer: BC Managed Care – PPO | Admitting: Orthopedic Surgery

## 2020-04-28 ENCOUNTER — Other Ambulatory Visit: Payer: Self-pay

## 2020-04-28 DIAGNOSIS — S42201A Unspecified fracture of upper end of right humerus, initial encounter for closed fracture: Secondary | ICD-10-CM | POA: Diagnosis not present

## 2020-04-28 NOTE — Progress Notes (Signed)
[facility] Clinic Return  Assessment: [name] is a 45 y.o. female with the following: Right proximal humerus fracture; improved alignment, treated in a sling  Plan: Repeat XR with acceptable, if not improved alignment.   Continue nonoperative management Continue to use sling as needed Ok to remove if comfortable. Referral to OT to initiate ROM, plan to start in next 7-10 days  Follow-up: Return in about 4 weeks (around 05/26/2020).   Subjective:  Chief Complaint  Patient presents with  . Shoulder Injury    03/22/20 right shoulder injury /fracture, improving some     History of Present Illness: [name] is a 45 y.o. female who presents for repeat evaluation of her right proximal humerus fracture.  She missed 2 previously scheduled appointments, and is now about a month out from the injury.  Her pain has improved, but she still has occasional pains in the shoulder.  She continues to use her sling.  She denies numbness and tingling in her RUE.  She has bumped her shoulder on several occasions, and this is painful, but typically does not persist.   Review of Systems: No fevers or chills No numbness or tingling No chest pain No shortness of breath No bowel or bladder dysfunction No GI distress No headaches   Objective:  Physical Exam:  Thin female, no acute distress Right arm in a sling. Tolerates gentle ROM.  Sensation intact over axillary patch.  Deltoid fires.  Active motion intact AIN/PIN/U nerve distribution.  Sensation intact in the M/U/R nerve distribution.  Fingers are warm and well perfused.   IMAGING: I personally ordered and reviewed the following images:  AR of the right shoulder demonstrates good alignment of the glenohumeral joint.  No interval displacement of the humeral neck fracture line.  Minimal displacement of the tuberosities.   Impression:  Healing right proximal humerus fracture in acceptable alignment.    Mordecai Rasmussen,  MD 04/28/2020 10:29 AM

## 2020-04-29 DIAGNOSIS — R1084 Generalized abdominal pain: Secondary | ICD-10-CM | POA: Diagnosis not present

## 2020-04-29 DIAGNOSIS — R109 Unspecified abdominal pain: Secondary | ICD-10-CM | POA: Diagnosis not present

## 2020-04-29 DIAGNOSIS — Z9049 Acquired absence of other specified parts of digestive tract: Secondary | ICD-10-CM | POA: Diagnosis not present

## 2020-05-01 IMAGING — CT CT HEAD WITHOUT CONTRAST
3 series · 16 of 47 positions shown, 19 images · non-contrast
Comparison: 01/24/2018

CLINICAL DATA: Migraine

EXAM:
CT HEAD WITHOUT CONTRAST
TECHNIQUE: Contiguous axial images were obtained from the base of the skull
through the vertex without intravenous contrast.

[Series 2: head wo · axial · 0.40mm/px · z∈[-166,-36]mm · 10 of 32 slices shown, 13 images]
[im 3/32  brain]
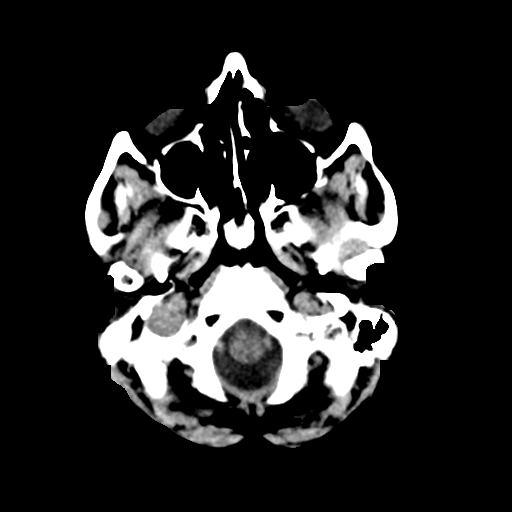
[im 3/32  bone]
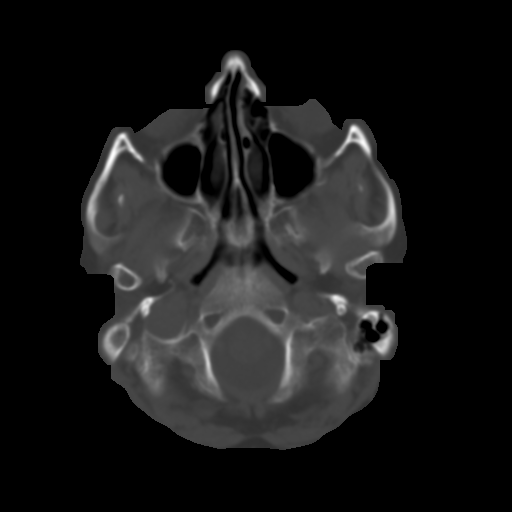
[im 6/32  brain]
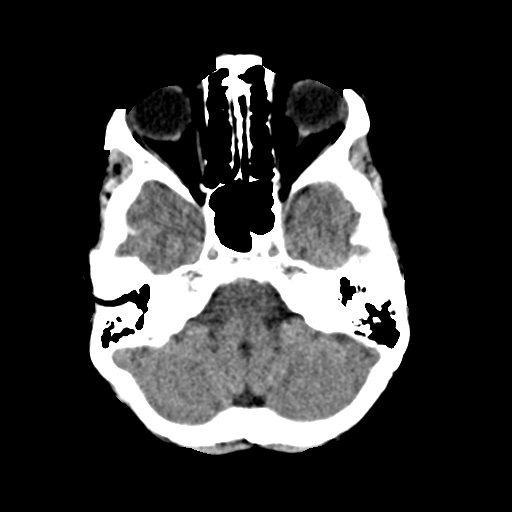
[im 9/32  brain]
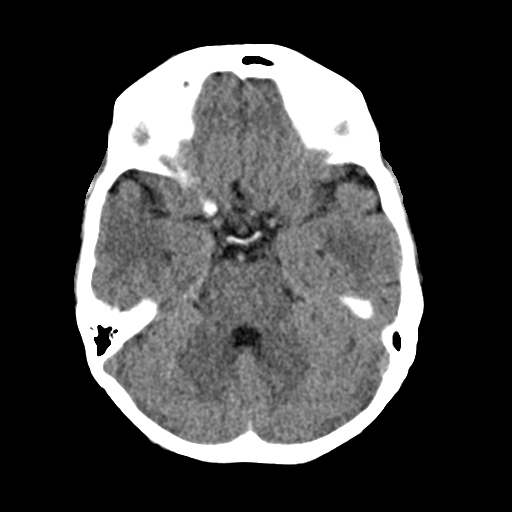
[im 11/32  brain]
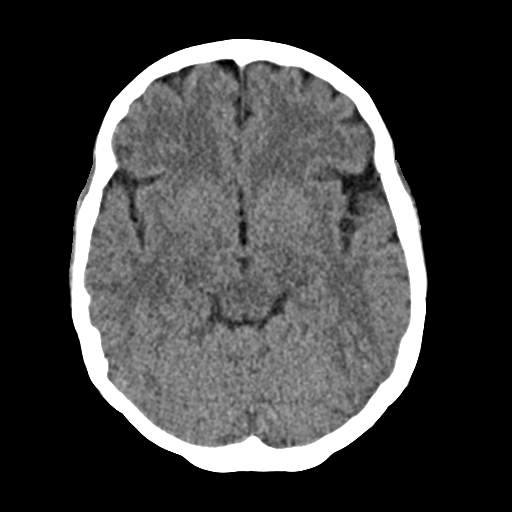
[im 14/32  brain]
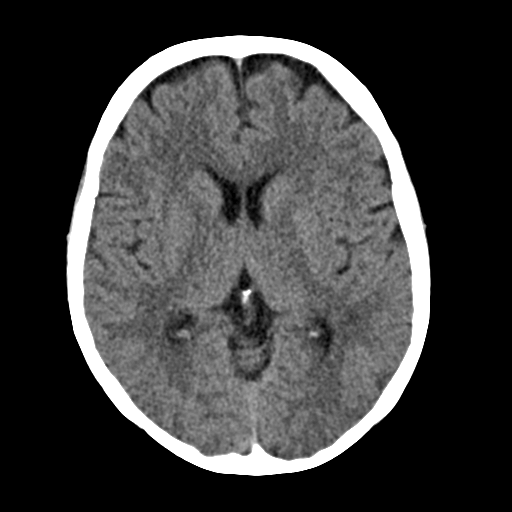
[im 14/32  bone]
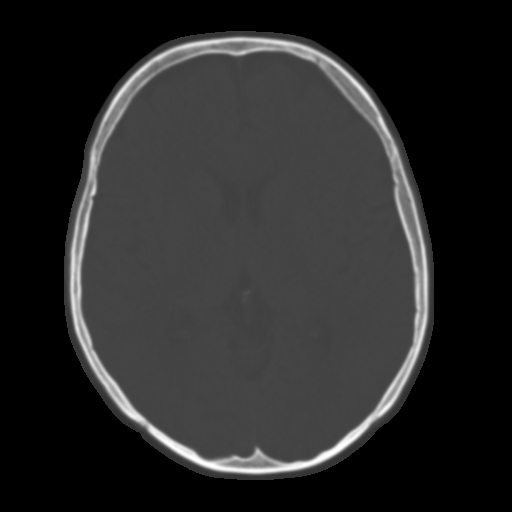
[im 18/32  brain]
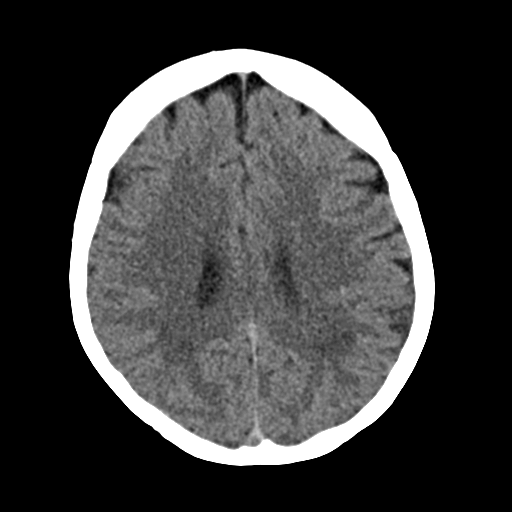
[im 21/32  brain]
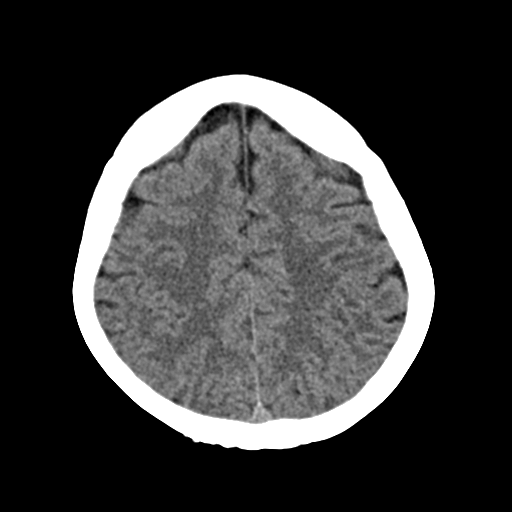
[im 24/32  brain]
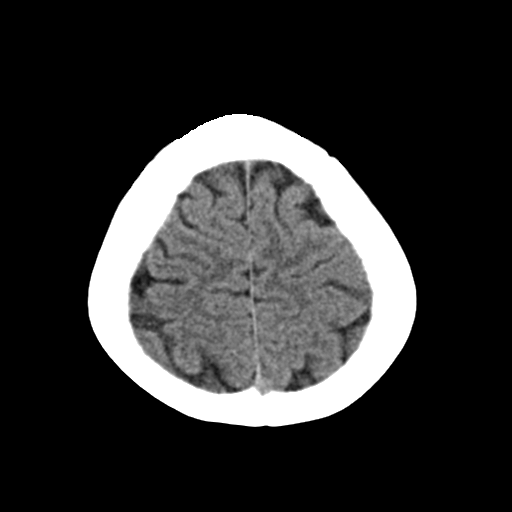
[im 26/32  brain]
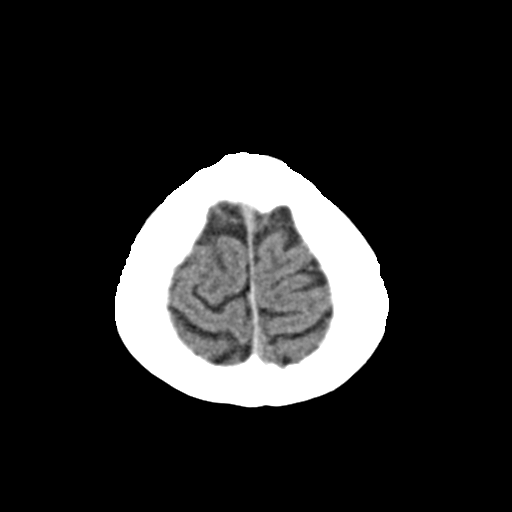
[im 26/32  bone]
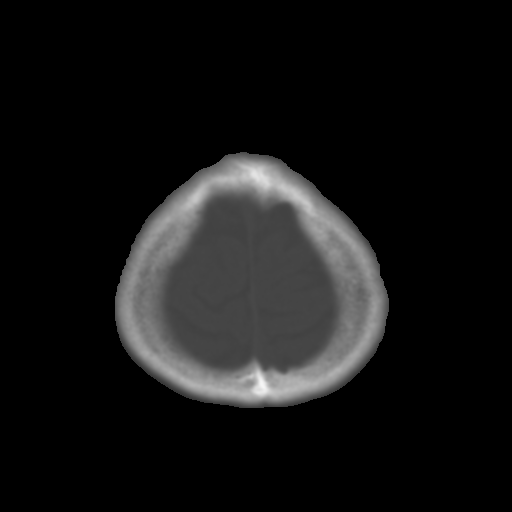
[im 29/32  brain]
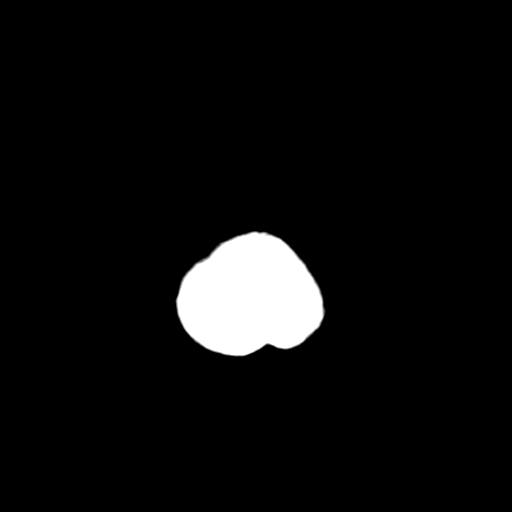

[Series 4: coronal soft · coronal · 0.32mm/px · 3 of 62 slices shown]
[im 21/62  brain]
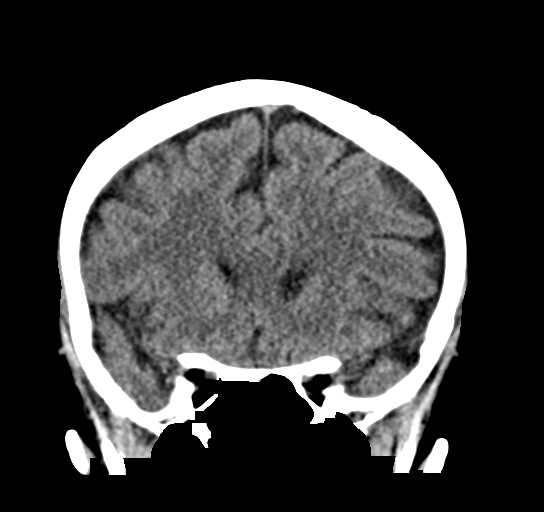
[im 28/62  brain]
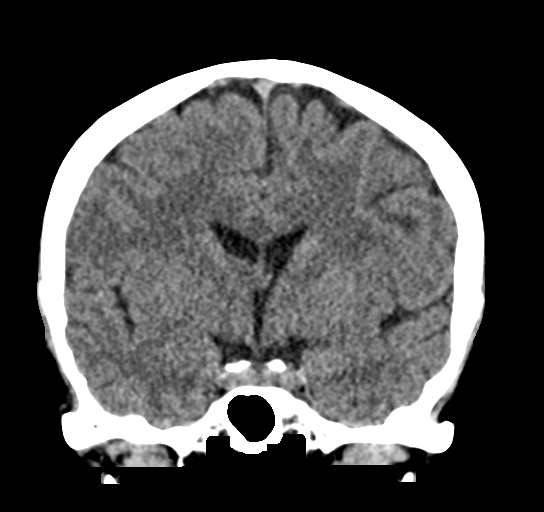
[im 34/62  brain]
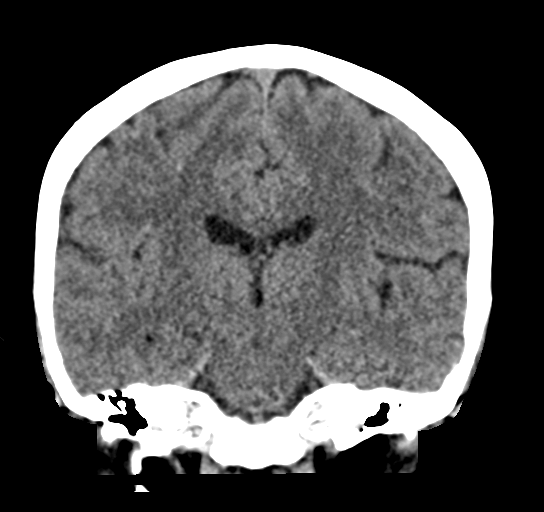

[Series 5: sag soft · sagittal · 0.32mm/px · 3 of 55 slices shown]
[im 19/55  brain]
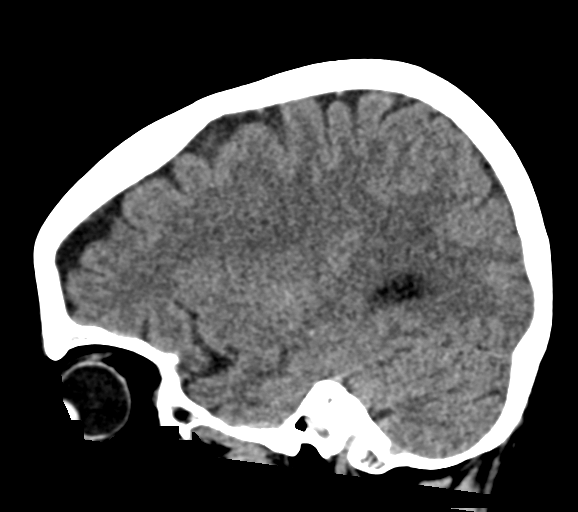
[im 28/55  brain]
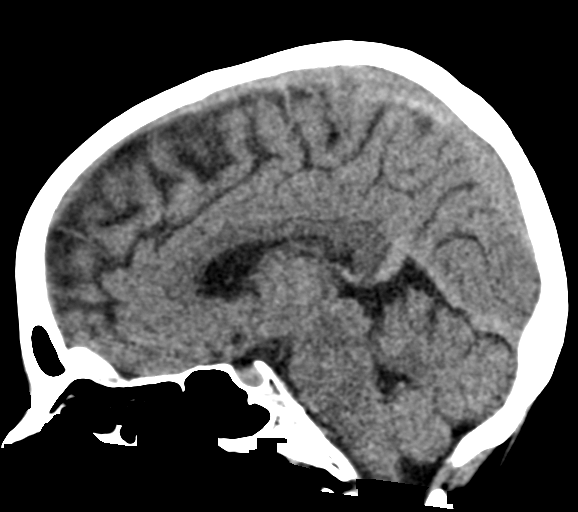
[im 37/55  brain]
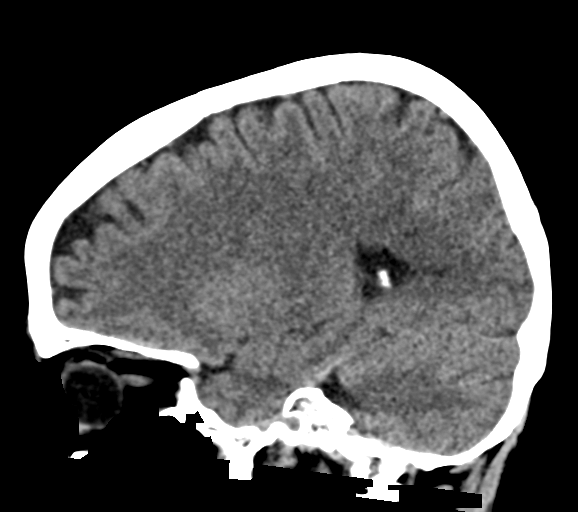

[16 of 47 positions shown; findings below may reference images not displayed]

FINDINGS: Brain: No evidence of acute infarction, hemorrhage, hydrocephalus,
extra-axial collection or mass lesion/mass effect.

Vascular: No hyperdense vessel or unexpected calcification.

Skull: Normal. Negative for fracture or focal lesion.

Sinuses/Orbits: No acute finding.

Other: None.
IMPRESSION: No acute intracranial pathology. No non-contrast CT findings to
explain headache.

## 2020-05-06 DIAGNOSIS — Z681 Body mass index (BMI) 19 or less, adult: Secondary | ICD-10-CM | POA: Diagnosis not present

## 2020-05-06 DIAGNOSIS — M5459 Other low back pain: Secondary | ICD-10-CM | POA: Diagnosis not present

## 2020-05-06 DIAGNOSIS — J309 Allergic rhinitis, unspecified: Secondary | ICD-10-CM | POA: Diagnosis not present

## 2020-05-07 ENCOUNTER — Ambulatory Visit (HOSPITAL_COMMUNITY): Payer: BC Managed Care – PPO | Attending: Orthopedic Surgery | Admitting: Specialist

## 2020-05-17 ENCOUNTER — Telehealth: Payer: Self-pay | Admitting: Radiology

## 2020-05-17 DIAGNOSIS — Z681 Body mass index (BMI) 19 or less, adult: Secondary | ICD-10-CM | POA: Diagnosis not present

## 2020-05-17 DIAGNOSIS — M5459 Other low back pain: Secondary | ICD-10-CM | POA: Diagnosis not present

## 2020-05-17 NOTE — Telephone Encounter (Signed)
Last OV was 04/28/2020. She has a follow up scheduled for 05/26/2020. Please advise.

## 2020-05-17 NOTE — Telephone Encounter (Signed)
Thanks  It really sounds like it might be a new injury.  If it is back pain, I am willing to give her some flexeril.  Otherwise, she should come in earlier to make sure that nothing has changed.  Please advise her that I only prescribe narcotic pain medications following surgery.  Let me know how she wises to proceed.   Elta [place]

## 2020-05-17 NOTE — Telephone Encounter (Signed)
Patient called and asked if Dr Amedeo Kinsman could send her something in for pain.  She fell and has some back pain, she also has bumped/hit her arm several times since here last visit and it hurts.  [place].

## 2020-05-18 NOTE — Telephone Encounter (Signed)
I called the patient and was unable to leave a message for the patient with the provider recommendations.

## 2020-05-20 DIAGNOSIS — R339 Retention of urine, unspecified: Secondary | ICD-10-CM | POA: Diagnosis not present

## 2020-05-21 MED ORDER — CYCLOBENZAPRINE HCL 5 MG PO TABS: 5.0000 mg | ORAL_TABLET | Freq: Three times a day (TID) | ORAL | 0 refills | Status: DC | PRN

## 2020-05-21 NOTE — Addendum Note (Signed)
Addended by: Larena Glassman A on: 05/21/2020 01:07 PM   Modules accepted: Orders

## 2020-05-21 NOTE — Telephone Encounter (Signed)
I called the patient and she is agreeable to take the flexeril and she does not want to come in sooner that her follow up. Please send in flexeril to [place].

## 2020-05-24 ENCOUNTER — Other Ambulatory Visit: Payer: Self-pay

## 2020-05-24 ENCOUNTER — Ambulatory Visit (INDEPENDENT_AMBULATORY_CARE_PROVIDER_SITE_OTHER): Payer: BC Managed Care – PPO | Admitting: Gastroenterology

## 2020-05-24 ENCOUNTER — Encounter (INDEPENDENT_AMBULATORY_CARE_PROVIDER_SITE_OTHER): Payer: Self-pay

## 2020-05-24 ENCOUNTER — Other Ambulatory Visit (INDEPENDENT_AMBULATORY_CARE_PROVIDER_SITE_OTHER): Payer: Self-pay

## 2020-05-24 ENCOUNTER — Encounter (INDEPENDENT_AMBULATORY_CARE_PROVIDER_SITE_OTHER): Payer: Self-pay | Admitting: Gastroenterology

## 2020-05-24 ENCOUNTER — Telehealth (INDEPENDENT_AMBULATORY_CARE_PROVIDER_SITE_OTHER): Payer: Self-pay

## 2020-05-24 VITALS — BP 101/71 | HR 90 | Temp 97.9°F | Ht 62.0 in | Wt 87.0 lb

## 2020-05-24 DIAGNOSIS — R109 Unspecified abdominal pain: Secondary | ICD-10-CM | POA: Insufficient documentation

## 2020-05-24 DIAGNOSIS — R110 Nausea: Secondary | ICD-10-CM | POA: Insufficient documentation

## 2020-05-24 DIAGNOSIS — R1084 Generalized abdominal pain: Secondary | ICD-10-CM | POA: Diagnosis not present

## 2020-05-24 DIAGNOSIS — Z1211 Encounter for screening for malignant neoplasm of colon: Secondary | ICD-10-CM

## 2020-05-24 DIAGNOSIS — K591 Functional diarrhea: Secondary | ICD-10-CM | POA: Diagnosis not present

## 2020-05-24 DIAGNOSIS — E43 Unspecified severe protein-calorie malnutrition: Secondary | ICD-10-CM | POA: Diagnosis not present

## 2020-05-24 MED ORDER — PEG 3350-KCL-NA BICARB-NACL 420 G PO SOLR: 4000.0000 mL | ORAL | 0 refills | Status: DC

## 2020-05-24 NOTE — Progress Notes (Signed)
Maylon Peppers, M.D. Gastroenterology & Hepatology [facility] [address] [place], [place] [address]  Primary Care Physician: [name], DO [address] [place] [address]  I will communicate my assessment and recommendations to the referring MD via EMR.  Problems: 1. Chronic abdominal pain 2. Nausea with vomiting 3. IBS 4. Severe protein-calorie malnutrition  History of Present Illness: [name] is a 45 y.o. female with past medical history of bipolar disorder, cervical cancer, COPD, depression, fibromyalgia, GERD, hypertension, irritable bowel syndrome, Seizures, history of substance abuse, OSA, hyperlipidemia, who presents for evaluation of vomiting, abdominal pain and failure to thrive.  The patient was last seen on 09/23/2018. At that time, the patient was ordered to have an EGD for evaluation of vomiting and nausea with findings described below.  Had a GI pathogen panel checked which was negative for any alteration.  She reports chronic symptoms throughout her life of abdominal pain and poor oral intake which have worsened through the years. Patient reports that last and a half year she presented significant nausea and vomiting episodes, as well as weight loss.  She reports that she has not been vomiting recently as she has tried to eat at home as much as possible about she gets full easily. States that she was prescribed Creon by an outside provider and her nausea/vomiting have improved since then. The patient reports that she has decreased appetite but she tries to eat three times a day, states that she forces herself to eat. The patient also reports chronic lower and mid abdominal pain described as a dull pain in her mid abdomen constantly which does not radiate anywhere else. Patient was 135 lb a year ago but she reports having progressive weight loss.  Due to this, she was prescribed mirtazapaine  22.5 mg at bedtime but has not felt any improvement while taking the medication.Patient has diarrhea 2 days out of the week (has 8 watery BMs without melena or hematochezia), while she may have constipation 2 days of the month.The patient denies having any fever, chills, hematemesis, diarrhea, jaundice, pruritus.  The patient reports having an episode of pancreatitis in the past, but no records about these are available.   Most recent cross-sectional abdominal imaging was a CT scan of the abdomen and pelvis with IV contrast on 12/19/2018, there was presence of an opacity on the subpleural right upper lobe consistent with a pulmonary contusion but no presence of any intra-abdominal alterations.  Last EGD:10/25/2018, normal esophagus, erythematous mucosa in the antrum which was biopsied negative for H. pylori, normal duodenum.  In 2014 she had biopsies of the small bowel which were negative for any alteration. Last Colonoscopy: 1 09/03/2014, normal colonoscopy, recommended to have repeat colonoscopy in 5 years given personal history of adenomatous polyps by age 20  Past Medical History: Past Medical History:  Diagnosis Date  . 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 Anal fissure 03/11/2009  . Anemia   . Anxiety   . Anxiety and depression   . ARTHRITIS 08/31/2007  . Arthritis   . Asthma   . 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 Bipolar 1 disorder ([place])   . Bowel obstruction ([place])   . Breakdown of urinary electronic stimulator device, init ([place])   . BRONCHITIS, RECURRENT 08/23/2009   Asthmatic Bronchitis-Dr. Melvyn Novas.....-HFA 75% 12/04/2008>75% 02/05/2009>75% 08/04/2009 -PFT's 01/04/2009 2.56 (86%) ratio 75, no resp to B2 and DLC0 67% > 80 after correction   .  Cancer (HCC)    cervical cancer  . Chronic interstitial cystitis 03/11/2009  . Chronic nausea   . Chronic pain   . Colon polyps   . COLONIC POLYPS, HX OF 07/25/2006   ADENOMATOUS POLYP  . COPD (chronic obstructive pulmonary disease) ([place])    . DEPRESSION 08/31/2007  . Endometriosis   . FIBROMYALGIA 08/31/2007  . Fibromyalgia   . GERD 02/05/2009  . Hyperlipidemia   . HYPERTENSION 08/31/2007  . IBS 03/11/2009  . Internal hemorrhoids   . Migraine headache   . 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 PONV (postoperative nausea and vomiting)   . RECTAL BLEEDING 03/11/2009  . Seizures ([place])    been about 1 year since last seisure per pt  . SLEEP APNEA 08/31/2007  . Substance abuse ([place])   . Thyroid disease   . Uterine cyst     Past Surgical History: Past Surgical History:  Procedure Laterality Date  . ABDOMINAL HYSTERECTOMY    . BIOPSY  10/25/2018   Procedure: BIOPSY;  Surgeon: Rogene Houston, MD;  Location: [facility];  Service: Endoscopy;;  gastric  . bladder stretching x6    . BLADDER SURGERY     stimulator placed and stretching   . CHOLECYSTECTOMY    . COLONOSCOPY    . COLONOSCOPY WITH PROPOFOL N/A 09/03/2014   Procedure: COLONOSCOPY WITH PROPOFOL;  Surgeon: Milus Banister, MD;  Location: [facility];  Service: Endoscopy;  Laterality: N/A;  . ESOPHAGOGASTRODUODENOSCOPY (EGD) WITH PROPOFOL N/A 10/25/2018   Procedure: ESOPHAGOGASTRODUODENOSCOPY (EGD) WITH PROPOFOL;  Surgeon: Rogene Houston, MD;  Location: [facility];  Service: Endoscopy;  Laterality: N/A;  11:15  . interstitial cystitis    . PACEMAKER INSERTION     in hip for interstitial cystitis  . removal of uterine cyst and scrapped uterus    . replaced bladder pacemaker      Family History: Family History  Problem Relation Age of Onset  . Heart disease Father   . Asthma Maternal Grandmother   . Emphysema Maternal Grandfather   . Cancer Maternal Grandfather        Lung Cancer  . Cancer Other        Lung Cancer-Aunt  . Colon cancer Neg Hx   . Esophageal cancer Neg Hx   . Rectal cancer Neg Hx   . Stomach cancer Neg Hx   . Thyroid disease Neg Hx     Social History: Social History   Tobacco Use  Smoking Status Current Every Day Smoker  . Packs/day:  0.50  . Years: 14.00  . Pack years: 7.00  . Types: Cigarettes  Smokeless Tobacco Never Used   Social History   Substance and Sexual Activity  Alcohol Use Not Currently   Comment: last drink 3 weeks ago; recently released from rehab   Social History   Substance and Sexual Activity  Drug Use Not Currently  . Types: Marijuana   Comment: once a month    Allergies: Allergies  Allergen Reactions  . Abilify [Aripiprazole] Swelling, Palpitations and Other (See Comments)    Throat swelling, tremors   . Amitriptyline Anaphylaxis    Angioedema  . Metoclopramide Hcl Other (See Comments)    Causes seizures  . Propoxyphene Rash  . Toradol [Ketorolac Tromethamine]   . Tramadol Swelling, Other (See Comments) and Rash    Throat swelling, tremors  . Ambien [Zolpidem Tartrate] Other (See Comments)    hallucinations  . Eszopiclone Other (See Comments)    Hallucinations, hyperactivity, and bad  taste in mouth   . Codeine Other (See Comments)    Tylenol #3 caused itching  . Varenicline Other (See Comments)    Suicidal thoughts  . Buprenorphine Hcl Itching and Hives  . Demerol [Meperidine] Rash  . Emetrol Hives, Itching and Rash  . Morphine And Related Hives and Itching    Medications: Current Outpatient Medications  Medication Sig Dispense Refill  . acetaminophen (TYLENOL) 500 MG tablet Take 1,000 mg by mouth every 6 (six) hours as needed.    Marland Kitchen albuterol (PROAIR HFA) 108 (90 Base) MCG/ACT inhaler INHALE 2 PUFFS EVERY 6 HOURS AS NEEDED FOR SHORTNESS OF BREATH AND WHEEZING. 8.5 g 1  . budesonide-formoterol (SYMBICORT) 80-4.5 MCG/ACT inhaler Inhale 2 puffs into the lungs 2 (two) times daily. 1 Inhaler 2  . clonazePAM (KLONOPIN) 0.5 MG tablet Take 1 tablet (0.5 mg total) by mouth 3 (three) times daily as needed for anxiety. 90 tablet 1  . cyclobenzaprine (FLEXERIL) 5 MG tablet Take 1 tablet (5 mg total) by mouth 3 (three) times daily as needed for muscle spasms. 15 tablet 0  . estradiol  (ESTRACE) 0.1 MG/GM vaginal cream Place 1 Applicatorful vaginally See admin instructions. Every other night for vaginal dryness    . fluticasone (FLONASE) 50 MCG/ACT nasal spray Place 2 sprays into both nostrils daily. (Patient taking differently: Place 2 sprays into both nostrils daily as needed for allergies.) 16 g 6  . gabapentin (NEURONTIN) 300 MG capsule Take 1 capsule (300 mg total) by mouth 2 (two) times daily. For agitation (Patient taking differently: Take 300 mg by mouth 3 (three) times daily.)    . hydrOXYzine (ATARAX/VISTARIL) 25 MG tablet Take 25 mg by mouth at bedtime.     Marland Kitchen Hyoscyamine Sulfate SL 0.125 MG SUBL Take 1 tablet by mouth every 4 (four) hours as needed (for cramping).     Marland Kitchen lamoTRIgine (LAMICTAL) 100 MG tablet Take 100 mg by mouth 2 (two) times daily.    . mirtazapine (REMERON) 45 MG tablet Take 22.5 mg by mouth at bedtime.     Marland Kitchen omeprazole (PRILOSEC) 40 MG capsule Take 1 capsule (40 mg total) by mouth daily. 90 capsule 0  . OVER THE COUNTER MEDICATION Estradiol one po QHS.    Marland Kitchen oxybutynin (DITROPAN XL) 15 MG 24 hr tablet Take 15 mg by mouth daily.    . prochlorperazine (COMPAZINE) 10 MG tablet Take 1 tablet (10 mg total) by mouth as needed (headache). 5 tablet 0  . promethazine (PHENERGAN) 25 MG tablet Take 1 tablet (25 mg total) by mouth every 6 (six) hours as needed for nausea or vomiting. 12 tablet 1  . QUEtiapine (SEROQUEL) 400 MG tablet Take 400 mg by mouth at bedtime.     . RESTASIS 0.05 % ophthalmic emulsion Place into both eyes 2 (two) times daily.    Marland Kitchen acetaminophen-codeine (TYLENOL #4) 300-60 MG tablet Take 1 tablet by mouth every 4 (four) hours as needed for moderate pain. (Patient not taking: Reported on 05/24/2020)    . potassium chloride SA (KLOR-CON) 20 MEQ tablet Take 1 tablet (20 mEq total) by mouth 2 (two) times daily. (Patient not taking: Reported on 05/24/2020) 14 tablet 0  . topiramate (TOPAMAX) 50 MG tablet Take 1 tablet (50 mg total) by mouth 2 (two)  times daily. (Patient not taking: Reported on 05/24/2020) 60 tablet 5   No current facility-administered medications for this visit.    Review of Systems: GENERAL: negative for malaise, night sweats HEENT: No changes in  hearing or vision, no nose bleeds or other nasal problems. NECK: Negative for lumps, goiter, pain and significant neck swelling RESPIRATORY: Negative for cough, wheezing CARDIOVASCULAR: Negative for chest pain, leg swelling, palpitations, orthopnea GI: SEE HPI MUSCULOSKELETAL: Negative for joint pain or swelling, back pain, and muscle pain. SKIN: Negative for lesions, rash PSYCH: Negative for sleep disturbance, mood disorder and recent psychosocial stressors. HEMATOLOGY Negative for prolonged bleeding, bruising easily, and swollen nodes. ENDOCRINE: Negative for cold or heat intolerance, polyuria, polydipsia and goiter. NEURO: negative for tremor, gait imbalance, syncope and seizures. The remainder of the review of systems is noncontributory.   Physical Exam: BP 101/71 (BP Location: Left Arm, Patient Position: Sitting, Cuff Size: Small)   Pulse 90   Temp 97.9 F (36.6 C) (Oral)   Ht 5\' 2"  (1.575 m)   Wt 87 lb (39.5 kg)   BMI 15.91 kg/m  GENERAL: The patient is AO x3, in no acute distress. Underweight. HEENT: Head is normocephalic and atraumatic. EOMI are intact. Mouth is well hydrated and without lesions. Has temporal wasting. NECK: Supple. No masses LUNGS: Clear to auscultation. No presence of rhonchi/wheezing/rales. Adequate chest expansion HEART: RRR, normal s1 and s2. ABDOMEN: mildly tender to palpation in mild abdominal area, no guarding, no peritoneal signs, and nondistended. BS +. No masses. EXTREMITIES: Without any cyanosis, clubbing, rash, lesions or edema. NEUROLOGIC: AOx3, no focal motor deficit. SKIN: no jaundice, no rashes  Imaging/Labs: as above  I personally reviewed and interpreted the available labs, imaging and endoscopic files.  Impression  and Plan: SHAVELLE RUNKEL is a 45 y.o. female with past medical history of bipolar disorder, cervical cancer, COPD, depression, fibromyalgia, GERD, hypertension, irritable bowel syndrome, Seizures, history of substance abuse, OSA, hyperlipidemia, who presents for evaluation of vomiting, abdominal pain and failure to thrive.  The patient has presented significant symptoms that have led to major weight loss.  She has undergone cross-sectional abdominal imaging that has not shown any alterations explain her symptoms.  Given how severe has been her weight loss, I consider appropriate to repeat an endoscopic evaluation to determine if there is any alteration explaining her current presentation.  However I consider that given the polypharmacy and psychiatric disease she has, most of her symptoms are related to a combination of bowel hypersensitivity.  We will consider doing a gastric emptying study if the investigations are negative although this will likely be skewed result given the multiple medications she is currently taking.  She will benefit from taking protein supplements to improve her sarcopenia.  Patient is due for colorectal cancer screening, colonoscopy will be ordered today.  - Schedule EGD and colonoscopy -Start taking Boost or Ensure protein shakes three to four times a day - RTC 3 months  All questions were answered.      Harvel Quale, MD Gastroenterology and Hepatology [facility]

## 2020-05-24 NOTE — Patient Instructions (Signed)
Schedule EGD and colonoscopy Star taking Boost or Ensure protein shakes three to four times a day

## 2020-05-24 NOTE — Telephone Encounter (Signed)
[name], CMA  

## 2020-05-24 NOTE — H&P (View-Only) (Signed)
Maylon Peppers, M.D. Gastroenterology & Hepatology [facility] [address] [place], [place] [address]  Primary Care Physician: [name], DO [address] [place] [address]  I will communicate my assessment and recommendations to the referring MD via EMR.  Problems: 1. Chronic abdominal pain 2. Nausea with vomiting 3. IBS 4. Severe protein-calorie malnutrition  History of Present Illness: [name] is a 45 y.o. female with past medical history of bipolar disorder, cervical cancer, COPD, depression, fibromyalgia, GERD, hypertension, irritable bowel syndrome, Seizures, history of substance abuse, OSA, hyperlipidemia, who presents for evaluation of vomiting, abdominal pain and failure to thrive.  The patient was last seen on 09/23/2018. At that time, the patient was ordered to have an EGD for evaluation of vomiting and nausea with findings described below.  Had a GI pathogen panel checked which was negative for any alteration.  She reports chronic symptoms throughout her life of abdominal pain and poor oral intake which have worsened through the years. Patient reports that last and a half year she presented significant nausea and vomiting episodes, as well as weight loss.  She reports that she has not been vomiting recently as she has tried to eat at home as much as possible about she gets full easily. States that she was prescribed Creon by an outside provider and her nausea/vomiting have improved since then. The patient reports that she has decreased appetite but she tries to eat three times a day, states that she forces herself to eat. The patient also reports chronic lower and mid abdominal pain described as a dull pain in her mid abdomen constantly which does not radiate anywhere else. Patient was 135 lb a year ago but she reports having progressive weight loss.  Due to this, she was prescribed mirtazapaine  22.5 mg at bedtime but has not felt any improvement while taking the medication.Patient has diarrhea 2 days out of the week (has 8 watery BMs without melena or hematochezia), while she may have constipation 2 days of the month.The patient denies having any fever, chills, hematemesis, diarrhea, jaundice, pruritus.  The patient reports having an episode of pancreatitis in the past, but no records about these are available.   Most recent cross-sectional abdominal imaging was a CT scan of the abdomen and pelvis with IV contrast on 12/19/2018, there was presence of an opacity on the subpleural right upper lobe consistent with a pulmonary contusion but no presence of any intra-abdominal alterations.  Last EGD:10/25/2018, normal esophagus, erythematous mucosa in the antrum which was biopsied negative for H. pylori, normal duodenum.  In 2014 she had biopsies of the small bowel which were negative for any alteration. Last Colonoscopy: 1 09/03/2014, normal colonoscopy, recommended to have repeat colonoscopy in 5 years given personal history of adenomatous polyps by age 81  Past Medical History: Past Medical History:  Diagnosis Date  . 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 Anal fissure 03/11/2009  . Anemia   . Anxiety   . Anxiety and depression   . ARTHRITIS 08/31/2007  . Arthritis   . Asthma   . 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 Bipolar 1 disorder ([place])   . Bowel obstruction ([place])   . Breakdown of urinary electronic stimulator device, init ([place])   . BRONCHITIS, RECURRENT 08/23/2009   Asthmatic Bronchitis-Dr. Melvyn Novas.....-HFA 75% 12/04/2008>75% 02/05/2009>75% 08/04/2009 -PFT's 01/04/2009 2.56 (86%) ratio 75, no resp to B2 and DLC0 67% > 80 after correction   .  Cancer (HCC)    cervical cancer  . Chronic interstitial cystitis 03/11/2009  . Chronic nausea   . Chronic pain   . Colon polyps   . COLONIC POLYPS, HX OF 07/25/2006   ADENOMATOUS POLYP  . COPD (chronic obstructive pulmonary disease) ([place])    . DEPRESSION 08/31/2007  . Endometriosis   . FIBROMYALGIA 08/31/2007  . Fibromyalgia   . GERD 02/05/2009  . Hyperlipidemia   . HYPERTENSION 08/31/2007  . IBS 03/11/2009  . Internal hemorrhoids   . Migraine headache   . 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 PONV (postoperative nausea and vomiting)   . RECTAL BLEEDING 03/11/2009  . Seizures ([place])    been about 1 year since last seisure per pt  . SLEEP APNEA 08/31/2007  . Substance abuse ([place])   . Thyroid disease   . Uterine cyst     Past Surgical History: Past Surgical History:  Procedure Laterality Date  . ABDOMINAL HYSTERECTOMY    . BIOPSY  10/25/2018   Procedure: BIOPSY;  Surgeon: Rogene Houston, MD;  Location: [facility];  Service: Endoscopy;;  gastric  . bladder stretching x6    . BLADDER SURGERY     stimulator placed and stretching   . CHOLECYSTECTOMY    . COLONOSCOPY    . COLONOSCOPY WITH PROPOFOL N/A 09/03/2014   Procedure: COLONOSCOPY WITH PROPOFOL;  Surgeon: Milus Banister, MD;  Location: [facility];  Service: Endoscopy;  Laterality: N/A;  . ESOPHAGOGASTRODUODENOSCOPY (EGD) WITH PROPOFOL N/A 10/25/2018   Procedure: ESOPHAGOGASTRODUODENOSCOPY (EGD) WITH PROPOFOL;  Surgeon: Rogene Houston, MD;  Location: [facility];  Service: Endoscopy;  Laterality: N/A;  11:15  . interstitial cystitis    . PACEMAKER INSERTION     in hip for interstitial cystitis  . removal of uterine cyst and scrapped uterus    . replaced bladder pacemaker      Family History: Family History  Problem Relation Age of Onset  . Heart disease Father   . Asthma Maternal Grandmother   . Emphysema Maternal Grandfather   . Cancer Maternal Grandfather        Lung Cancer  . Cancer Other        Lung Cancer-Aunt  . Colon cancer Neg Hx   . Esophageal cancer Neg Hx   . Rectal cancer Neg Hx   . Stomach cancer Neg Hx   . Thyroid disease Neg Hx     Social History: Social History   Tobacco Use  Smoking Status Current Every Day Smoker  . Packs/day:  0.50  . Years: 14.00  . Pack years: 7.00  . Types: Cigarettes  Smokeless Tobacco Never Used   Social History   Substance and Sexual Activity  Alcohol Use Not Currently   Comment: last drink 3 weeks ago; recently released from rehab   Social History   Substance and Sexual Activity  Drug Use Not Currently  . Types: Marijuana   Comment: once a month    Allergies: Allergies  Allergen Reactions  . Abilify [Aripiprazole] Swelling, Palpitations and Other (See Comments)    Throat swelling, tremors   . Amitriptyline Anaphylaxis    Angioedema  . Metoclopramide Hcl Other (See Comments)    Causes seizures  . Propoxyphene Rash  . Toradol [Ketorolac Tromethamine]   . Tramadol Swelling, Other (See Comments) and Rash    Throat swelling, tremors  . Ambien [Zolpidem Tartrate] Other (See Comments)    hallucinations  . Eszopiclone Other (See Comments)    Hallucinations, hyperactivity, and bad  taste in mouth   . Codeine Other (See Comments)    Tylenol #3 caused itching  . Varenicline Other (See Comments)    Suicidal thoughts  . Buprenorphine Hcl Itching and Hives  . Demerol [Meperidine] Rash  . Emetrol Hives, Itching and Rash  . Morphine And Related Hives and Itching    Medications: Current Outpatient Medications  Medication Sig Dispense Refill  . acetaminophen (TYLENOL) 500 MG tablet Take 1,000 mg by mouth every 6 (six) hours as needed.    Marland Kitchen albuterol (PROAIR HFA) 108 (90 Base) MCG/ACT inhaler INHALE 2 PUFFS EVERY 6 HOURS AS NEEDED FOR SHORTNESS OF BREATH AND WHEEZING. 8.5 g 1  . budesonide-formoterol (SYMBICORT) 80-4.5 MCG/ACT inhaler Inhale 2 puffs into the lungs 2 (two) times daily. 1 Inhaler 2  . clonazePAM (KLONOPIN) 0.5 MG tablet Take 1 tablet (0.5 mg total) by mouth 3 (three) times daily as needed for anxiety. 90 tablet 1  . cyclobenzaprine (FLEXERIL) 5 MG tablet Take 1 tablet (5 mg total) by mouth 3 (three) times daily as needed for muscle spasms. 15 tablet 0  . estradiol  (ESTRACE) 0.1 MG/GM vaginal cream Place 1 Applicatorful vaginally See admin instructions. Every other night for vaginal dryness    . fluticasone (FLONASE) 50 MCG/ACT nasal spray Place 2 sprays into both nostrils daily. (Patient taking differently: Place 2 sprays into both nostrils daily as needed for allergies.) 16 g 6  . gabapentin (NEURONTIN) 300 MG capsule Take 1 capsule (300 mg total) by mouth 2 (two) times daily. For agitation (Patient taking differently: Take 300 mg by mouth 3 (three) times daily.)    . hydrOXYzine (ATARAX/VISTARIL) 25 MG tablet Take 25 mg by mouth at bedtime.     Marland Kitchen Hyoscyamine Sulfate SL 0.125 MG SUBL Take 1 tablet by mouth every 4 (four) hours as needed (for cramping).     Marland Kitchen lamoTRIgine (LAMICTAL) 100 MG tablet Take 100 mg by mouth 2 (two) times daily.    . mirtazapine (REMERON) 45 MG tablet Take 22.5 mg by mouth at bedtime.     Marland Kitchen omeprazole (PRILOSEC) 40 MG capsule Take 1 capsule (40 mg total) by mouth daily. 90 capsule 0  . OVER THE COUNTER MEDICATION Estradiol one po QHS.    Marland Kitchen oxybutynin (DITROPAN XL) 15 MG 24 hr tablet Take 15 mg by mouth daily.    . prochlorperazine (COMPAZINE) 10 MG tablet Take 1 tablet (10 mg total) by mouth as needed (headache). 5 tablet 0  . promethazine (PHENERGAN) 25 MG tablet Take 1 tablet (25 mg total) by mouth every 6 (six) hours as needed for nausea or vomiting. 12 tablet 1  . QUEtiapine (SEROQUEL) 400 MG tablet Take 400 mg by mouth at bedtime.     . RESTASIS 0.05 % ophthalmic emulsion Place into both eyes 2 (two) times daily.    Marland Kitchen acetaminophen-codeine (TYLENOL #4) 300-60 MG tablet Take 1 tablet by mouth every 4 (four) hours as needed for moderate pain. (Patient not taking: Reported on 05/24/2020)    . potassium chloride SA (KLOR-CON) 20 MEQ tablet Take 1 tablet (20 mEq total) by mouth 2 (two) times daily. (Patient not taking: Reported on 05/24/2020) 14 tablet 0  . topiramate (TOPAMAX) 50 MG tablet Take 1 tablet (50 mg total) by mouth 2 (two)  times daily. (Patient not taking: Reported on 05/24/2020) 60 tablet 5   No current facility-administered medications for this visit.    Review of Systems: GENERAL: negative for malaise, night sweats HEENT: No changes in  hearing or vision, no nose bleeds or other nasal problems. NECK: Negative for lumps, goiter, pain and significant neck swelling RESPIRATORY: Negative for cough, wheezing CARDIOVASCULAR: Negative for chest pain, leg swelling, palpitations, orthopnea GI: SEE HPI MUSCULOSKELETAL: Negative for joint pain or swelling, back pain, and muscle pain. SKIN: Negative for lesions, rash PSYCH: Negative for sleep disturbance, mood disorder and recent psychosocial stressors. HEMATOLOGY Negative for prolonged bleeding, bruising easily, and swollen nodes. ENDOCRINE: Negative for cold or heat intolerance, polyuria, polydipsia and goiter. NEURO: negative for tremor, gait imbalance, syncope and seizures. The remainder of the review of systems is noncontributory.   Physical Exam: BP 101/71 (BP Location: Left Arm, Patient Position: Sitting, Cuff Size: Small)   Pulse 90   Temp 97.9 F (36.6 C) (Oral)   Ht 5\' 2"  (1.575 m)   Wt 87 lb (39.5 kg)   BMI 15.91 kg/m  GENERAL: The patient is AO x3, in no acute distress. Underweight. HEENT: Head is normocephalic and atraumatic. EOMI are intact. Mouth is well hydrated and without lesions. Has temporal wasting. NECK: Supple. No masses LUNGS: Clear to auscultation. No presence of rhonchi/wheezing/rales. Adequate chest expansion HEART: RRR, normal s1 and s2. ABDOMEN: mildly tender to palpation in mild abdominal area, no guarding, no peritoneal signs, and nondistended. BS +. No masses. EXTREMITIES: Without any cyanosis, clubbing, rash, lesions or edema. NEUROLOGIC: AOx3, no focal motor deficit. SKIN: no jaundice, no rashes  Imaging/Labs: as above  I personally reviewed and interpreted the available labs, imaging and endoscopic files.  Impression  and Plan: [name] is a 45 y.o. female with past medical history of bipolar disorder, cervical cancer, COPD, depression, fibromyalgia, GERD, hypertension, irritable bowel syndrome, Seizures, history of substance abuse, OSA, hyperlipidemia, who presents for evaluation of vomiting, abdominal pain and failure to thrive.  The patient has presented significant symptoms that have led to major weight loss.  She has undergone cross-sectional abdominal imaging that has not shown any alterations explain her symptoms.  Given how severe has been her weight loss, I consider appropriate to repeat an endoscopic evaluation to determine if there is any alteration explaining her current presentation.  However I consider that given the polypharmacy and psychiatric disease she has, most of her symptoms are related to a combination of bowel hypersensitivity.  We will consider doing a gastric emptying study if the investigations are negative although this will likely be skewed result given the multiple medications she is currently taking.  She will benefit from taking protein supplements to improve her sarcopenia.  Patient is due for colorectal cancer screening, colonoscopy will be ordered today.  - Schedule EGD and colonoscopy -Start taking Boost or Ensure protein shakes three to four times a day - RTC 3 months  All questions were answered.      Harvel Quale, MD Gastroenterology and Hepatology [facility]

## 2020-05-25 DIAGNOSIS — Z1339 Encounter for screening examination for other mental health and behavioral disorders: Secondary | ICD-10-CM | POA: Diagnosis not present

## 2020-05-25 DIAGNOSIS — Z1331 Encounter for screening for depression: Secondary | ICD-10-CM | POA: Diagnosis not present

## 2020-05-25 DIAGNOSIS — Z79899 Other long term (current) drug therapy: Secondary | ICD-10-CM | POA: Diagnosis not present

## 2020-05-25 DIAGNOSIS — G8929 Other chronic pain: Secondary | ICD-10-CM | POA: Diagnosis not present

## 2020-05-25 DIAGNOSIS — M5441 Lumbago with sciatica, right side: Secondary | ICD-10-CM | POA: Diagnosis not present

## 2020-05-25 DIAGNOSIS — F17210 Nicotine dependence, cigarettes, uncomplicated: Secondary | ICD-10-CM | POA: Diagnosis not present

## 2020-05-26 ENCOUNTER — Ambulatory Visit: Payer: BC Managed Care – PPO

## 2020-05-26 ENCOUNTER — Other Ambulatory Visit: Payer: Self-pay

## 2020-05-26 ENCOUNTER — Encounter: Payer: Self-pay | Admitting: Orthopedic Surgery

## 2020-05-26 ENCOUNTER — Ambulatory Visit (INDEPENDENT_AMBULATORY_CARE_PROVIDER_SITE_OTHER): Payer: BC Managed Care – PPO | Admitting: Orthopedic Surgery

## 2020-05-26 VITALS — BP 101/68 | HR 101 | Ht 62.0 in | Wt 87.0 lb

## 2020-05-26 DIAGNOSIS — S42294D Other nondisplaced fracture of upper end of right humerus, subsequent encounter for fracture with routine healing: Secondary | ICD-10-CM

## 2020-05-26 NOTE — Progress Notes (Signed)
Orthopaedic Clinic Return  Assessment: [name] is a 45 y.o. female with the following: Right proximal humerus fracture; treated in a sling  Plan: XR with maintenance of alignment.  Can DC sling.  Continue to work with OT on range of motion with transition to strengthening of the rotator cuff.  It is possible that she has injured her rotator cuff, but the current pain could be irritation of the shoulder because of limited motion.  She states she would prefer to contact the clinic if she is not progressing with PT.   Follow-up: Return if symptoms worsen or fail to improve.   Subjective:  Chief Complaint  Patient presents with  . Shoulder Pain    Right shoulder. Patient reports 4-4.5/10 today.     History of Present Illness: [name] is a 45 y.o. female who presents for repeat evaluation of her right proximal humerus fracture.  She continues to improve.  She has been wearing her sling.  She has initiated OT for her shoulder.  No numbness or tingling.  She had a recent fall and this resulted in some back pain and stiffness.  I provided her with some flexeril which helped.  No other medications for her shoulder.    Review of Systems: No fevers or chills No numbness or tingling No chest pain No shortness of breath No bowel or bladder dysfunction No GI distress No headaches   Objective:  Physical Exam:  Thin female, no acute distress Right arm in a sling. Active forward flexion to 90, abduction to 85 at her side.  She notes a pinching sensation beyond this ROM.  Negative belly press.  Sensation intact over axillary patch.  Deltoid fires.  Active motion intact AIN/PIN/U nerve distribution.  Sensation intact in the M/U/R nerve distribution.  Fingers are warm and well perfused.   IMAGING: I personally ordered and reviewed the following images:  XR of the right shoulder demonstrates maintenance of alignment.  There appears to be some proximal humeral migration, but this  could be projectional.  The humeral shaft is impacted within the humeral head.  Glenohumeral joint is reduced.   Impression:  right proximal humerus fracture in acceptable alignment, unchanged.    Mordecai Rasmussen, MD 05/26/2020 10:41 PM

## 2020-06-08 DIAGNOSIS — M5441 Lumbago with sciatica, right side: Secondary | ICD-10-CM | POA: Diagnosis not present

## 2020-06-08 DIAGNOSIS — Z79899 Other long term (current) drug therapy: Secondary | ICD-10-CM | POA: Diagnosis not present

## 2020-06-08 DIAGNOSIS — G629 Polyneuropathy, unspecified: Secondary | ICD-10-CM | POA: Diagnosis not present

## 2020-06-08 DIAGNOSIS — F17210 Nicotine dependence, cigarettes, uncomplicated: Secondary | ICD-10-CM | POA: Diagnosis not present

## 2020-06-08 DIAGNOSIS — G8929 Other chronic pain: Secondary | ICD-10-CM | POA: Diagnosis not present

## 2020-06-08 NOTE — Patient Instructions (Signed)
NYLIAH NIERENBERG  06/08/2020     @PREFPERIOPPHARMACY @   Your procedure is scheduled on  06/11/2020.   Report to [facility] at  [phone]  A.M.   Call this number if you have problems the morning of surgery:  [phone]   Remember:  Follow the diet and prep instructions given to you by the office.                         Take these medicines the morning of surgery with A SIP OF WATER  Clonazepam (if needed), flexeril(if needed), gabapentin, hydrocodone (if needed), hydroxyzine, lamictal, prilosec, ditropan, phenergan (if needed).  Use your inhalers before you come and bring your rescue inhaler with you.    Please brush your teeth.  Do not wear jewelry, make-up or nail polish.  Do not wear lotions, powders, or perfumes, or deodorant.  Do not shave 48 hours prior to surgery.  Men may shave face and neck.  Do not bring valuables to the hospital.  [facility] is not responsible for any belongings or valuables.  Contacts, dentures or bridgework may not be worn into surgery.  Leave your suitcase in the car.  After surgery it may be brought to your room.  For patients admitted to the hospital, discharge time will be determined by your treatment team.  Patients discharged the day of surgery will not be allowed to drive home and must have someone with them for 24 hours.   Special instructions:  DO NOT smoke tobacco or vape the morning of your procedure.   Please read over the following fact sheets that you were given. Anesthesia Post-op Instructions and Care and Recovery After Surgery       Upper Endoscopy, Adult, Care After This sheet gives you information about how to care for yourself after your procedure. Your health care provider may also give you more specific instructions. If you have problems or questions, contact your health care provider. What can I expect after the procedure? After the procedure, it is common to have:  A sore throat.  Mild stomach pain or  discomfort.  Bloating.  Nausea. Follow these instructions at home:  Follow instructions from your health care provider about what to eat or drink after your procedure.  Return to your normal activities as told by your health care provider. Ask your health care provider what activities are safe for you.  Take over-the-counter and prescription medicines only as told by your health care provider.  If you were given a sedative during the procedure, it can affect you for several hours. Do not drive or operate machinery until your health care provider says that it is safe.  Keep all follow-up visits as told by your health care provider. This is important.   Contact a health care provider if you have:  A sore throat that lasts longer than one day.  Trouble swallowing. Get help right away if:  You vomit blood or your vomit looks like coffee grounds.  You have: ? A fever. ? Bloody, black, or tarry stools. ? A severe sore throat or you cannot swallow. ? Difficulty breathing. ? Severe pain in your chest or abdomen. Summary  After the procedure, it is common to have a sore throat, mild stomach discomfort, bloating, and nausea.  If you were given a sedative during the procedure, it can affect you for several hours. Do not drive or operate machinery  until your health care provider says that it is safe.  Follow instructions from your health care provider about what to eat or drink after your procedure.  Return to your normal activities as told by your health care provider. This information is not intended to replace advice given to you by your health care provider. Make sure you discuss any questions you have with your health care provider. Document Revised: 03/25/2019 Document Reviewed: 08/27/2017 Elsevier Patient Education  2021 [place].  Colonoscopy, Adult, Care After This sheet gives you information about how to care for yourself after your procedure. Your health care provider  may also give you more specific instructions. If you have problems or questions, contact your health care provider. What can I expect after the procedure? After the procedure, it is common to have:  A small amount of blood in your stool for 24 hours after the procedure.  Some gas.  Mild cramping or bloating of your abdomen. Follow these instructions at home: Eating and drinking  Drink enough fluid to keep your urine pale yellow.  Follow instructions from your health care provider about eating or drinking restrictions.  Resume your normal diet as instructed by your health care provider. Avoid heavy or fried foods that are hard to digest.   Activity  Rest as told by your health care provider.  Avoid sitting for a long time without moving. Get up to take short walks every 1-2 hours. This is important to improve blood flow and breathing. Ask for help if you feel weak or unsteady.  Return to your normal activities as told by your health care provider. Ask your health care provider what activities are safe for you. Managing cramping and bloating  Try walking around when you have cramps or feel bloated.  Apply heat to your abdomen as told by your health care provider. Use the heat source that your health care provider recommends, such as a moist heat pack or a heating pad. ? Place a towel between your skin and the heat source. ? Leave the heat on for 20-30 minutes. ? Remove the heat if your skin turns bright red. This is especially important if you are unable to feel pain, heat, or cold. You may have a greater risk of getting burned.   General instructions  If you were given a sedative during the procedure, it can affect you for several hours. Do not drive or operate machinery until your health care provider says that it is safe.  For the first 24 hours after the procedure: ? Do not sign important documents. ? Do not drink alcohol. ? Do your regular daily activities at a slower pace  than normal. ? Eat soft foods that are easy to digest.  Take over-the-counter and prescription medicines only as told by your health care provider.  Keep all follow-up visits as told by your health care provider. This is important. Contact a health care provider if:  You have blood in your stool 2-3 days after the procedure. Get help right away if you have:  More than a small spotting of blood in your stool.  Large blood clots in your stool.  Swelling of your abdomen.  Nausea or vomiting.  A fever.  Increasing pain in your abdomen that is not relieved with medicine. Summary  After the procedure, it is common to have a small amount of blood in your stool. You may also have mild cramping and bloating of your abdomen.  If you were given a  sedative during the procedure, it can affect you for several hours. Do not drive or operate machinery until your health care provider says that it is safe.  Get help right away if you have a lot of blood in your stool, nausea or vomiting, a fever, or increased pain in your abdomen. This information is not intended to replace advice given to you by your health care provider. Make sure you discuss any questions you have with your health care provider. Document Revised: 03/21/2019 Document Reviewed: 10/21/2018 Elsevier Patient Education  2021 [place] After This sheet gives you information about how to care for yourself after your procedure. Your health care provider may also give you more specific instructions. If you have problems or questions, contact your health care provider. What can I expect after the procedure? After the procedure, it is common to have:  Tiredness.  Forgetfulness about what happened after the procedure.  Impaired judgment for important decisions.  Nausea or vomiting.  Some difficulty with balance. Follow these instructions at home: For the time period you were told by your health  care provider:  Rest as needed.  Do not participate in activities where you could fall or become injured.  Do not drive or use machinery.  Do not drink alcohol.  Do not take sleeping pills or medicines that cause drowsiness.  Do not make important decisions or sign legal documents.  Do not take care of children on your own.      Eating and drinking  Follow the diet that is recommended by your health care provider.  Drink enough fluid to keep your urine pale yellow.  If you vomit: ? Drink water, juice, or soup when you can drink without vomiting. ? Make sure you have little or no nausea before eating solid foods. General instructions  Have a responsible adult stay with you for the time you are told. It is important to have someone help care for you until you are awake and alert.  Take over-the-counter and prescription medicines only as told by your health care provider.  If you have sleep apnea, surgery and certain medicines can increase your risk for breathing problems. Follow instructions from your health care provider about wearing your sleep device: ? Anytime you are sleeping, including during daytime naps. ? While taking prescription pain medicines, sleeping medicines, or medicines that make you drowsy.  Avoid smoking.  Keep all follow-up visits as told by your health care provider. This is important. Contact a health care provider if:  You keep feeling nauseous or you keep vomiting.  You feel light-headed.  You are still sleepy or having trouble with balance after 24 hours.  You develop a rash.  You have a fever.  You have redness or swelling around the IV site. Get help right away if:  You have trouble breathing.  You have new-onset confusion at home. Summary  For several hours after your procedure, you may feel tired. You may also be forgetful and have poor judgment.  Have a responsible adult stay with you for the time you are told. It is important  to have someone help care for you until you are awake and alert.  Rest as told. Do not drive or operate machinery. Do not drink alcohol or take sleeping pills.  Get help right away if you have trouble breathing, or if you suddenly become confused. This information is not intended to replace advice given to you by your health care provider.  Make sure you discuss any questions you have with your health care provider. Document Revised: 12/11/2019 Document Reviewed: 02/27/2019 Elsevier Patient Education  2021 [employer].

## 2020-06-09 ENCOUNTER — Other Ambulatory Visit: Payer: Self-pay

## 2020-06-09 ENCOUNTER — Encounter (HOSPITAL_COMMUNITY): Payer: Self-pay

## 2020-06-09 ENCOUNTER — Encounter (HOSPITAL_COMMUNITY)
Admission: RE | Admit: 2020-06-09 | Discharge: 2020-06-09 | Disposition: A | Discharge status: Home / Self Care | Payer: BC Managed Care – PPO | Source: Ambulatory Visit | Attending: Gastroenterology | Admitting: Gastroenterology

## 2020-06-09 ENCOUNTER — Other Ambulatory Visit (HOSPITAL_COMMUNITY)
Admission: RE | Admit: 2020-06-09 | Discharge: 2020-06-09 | Disposition: A | Discharge status: Home / Self Care | Payer: BC Managed Care – PPO | Source: Ambulatory Visit | Attending: Gastroenterology | Admitting: Gastroenterology

## 2020-06-09 DIAGNOSIS — R627 Adult failure to thrive: Secondary | ICD-10-CM | POA: Diagnosis not present

## 2020-06-09 DIAGNOSIS — J449 Chronic obstructive pulmonary disease, unspecified: Secondary | ICD-10-CM | POA: Diagnosis not present

## 2020-06-09 DIAGNOSIS — G4733 Obstructive sleep apnea (adult) (pediatric): Secondary | ICD-10-CM | POA: Diagnosis not present

## 2020-06-09 DIAGNOSIS — K580 Irritable bowel syndrome with diarrhea: Secondary | ICD-10-CM | POA: Diagnosis not present

## 2020-06-09 DIAGNOSIS — R569 Unspecified convulsions: Secondary | ICD-10-CM | POA: Diagnosis not present

## 2020-06-09 DIAGNOSIS — K219 Gastro-esophageal reflux disease without esophagitis: Secondary | ICD-10-CM | POA: Diagnosis not present

## 2020-06-09 DIAGNOSIS — F17210 Nicotine dependence, cigarettes, uncomplicated: Secondary | ICD-10-CM | POA: Diagnosis not present

## 2020-06-09 DIAGNOSIS — Z01812 Encounter for preprocedural laboratory examination: Secondary | ICD-10-CM | POA: Insufficient documentation

## 2020-06-09 DIAGNOSIS — Z681 Body mass index (BMI) 19 or less, adult: Secondary | ICD-10-CM | POA: Diagnosis not present

## 2020-06-09 DIAGNOSIS — Z7951 Long term (current) use of inhaled steroids: Secondary | ICD-10-CM | POA: Diagnosis not present

## 2020-06-09 DIAGNOSIS — E43 Unspecified severe protein-calorie malnutrition: Secondary | ICD-10-CM | POA: Diagnosis not present

## 2020-06-09 DIAGNOSIS — I10 Essential (primary) hypertension: Secondary | ICD-10-CM | POA: Diagnosis not present

## 2020-06-09 DIAGNOSIS — M797 Fibromyalgia: Secondary | ICD-10-CM | POA: Diagnosis not present

## 2020-06-09 DIAGNOSIS — B3781 Candidal esophagitis: Secondary | ICD-10-CM | POA: Diagnosis not present

## 2020-06-09 DIAGNOSIS — Z20822 Contact with and (suspected) exposure to covid-19: Secondary | ICD-10-CM | POA: Insufficient documentation

## 2020-06-09 DIAGNOSIS — Z79899 Other long term (current) drug therapy: Secondary | ICD-10-CM | POA: Diagnosis not present

## 2020-06-09 DIAGNOSIS — Z86010 Personal history of colonic polyps: Secondary | ICD-10-CM | POA: Diagnosis not present

## 2020-06-09 DIAGNOSIS — Z8541 Personal history of malignant neoplasm of cervix uteri: Secondary | ICD-10-CM | POA: Diagnosis not present

## 2020-06-09 DIAGNOSIS — R1010 Upper abdominal pain, unspecified: Secondary | ICD-10-CM | POA: Diagnosis not present

## 2020-06-09 DIAGNOSIS — M6284 Sarcopenia: Secondary | ICD-10-CM | POA: Diagnosis not present

## 2020-06-09 DIAGNOSIS — F319 Bipolar disorder, unspecified: Secondary | ICD-10-CM | POA: Diagnosis not present

## 2020-06-09 DIAGNOSIS — Z1211 Encounter for screening for malignant neoplasm of colon: Secondary | ICD-10-CM | POA: Diagnosis not present

## 2020-06-09 DIAGNOSIS — R112 Nausea with vomiting, unspecified: Secondary | ICD-10-CM | POA: Diagnosis not present

## 2020-06-09 DIAGNOSIS — G8929 Other chronic pain: Secondary | ICD-10-CM | POA: Diagnosis not present

## 2020-06-09 DIAGNOSIS — Z79890 Hormone replacement therapy (postmenopausal): Secondary | ICD-10-CM | POA: Diagnosis not present

## 2020-06-09 HISTORY — DX: Acute pancreatitis without necrosis or infection, unspecified: K85.90

## 2020-06-09 HISTORY — DX: Sciatica, unspecified side: M54.30

## 2020-06-09 LAB — CBC WITH DIFFERENTIAL/PLATELET
Abs Immature Granulocytes: 0.05 10*3/uL (ref 0.00–0.07)
Basophils Absolute: 0.1 10*3/uL (ref 0.0–0.1)
Basophils Relative: 1 %
Eosinophils Absolute: 0.3 10*3/uL (ref 0.0–0.5)
Eosinophils Relative: 3 %
HCT: 37.2 % (ref 36.0–46.0)
Hemoglobin: 12 g/dL (ref 12.0–15.0)
Immature Granulocytes: 1 %
Lymphocytes Relative: 40 %
Lymphs Abs: 4.2 10*3/uL — ABNORMAL HIGH (ref 0.7–4.0)
MCH: 31 pg (ref 26.0–34.0)
MCHC: 32.3 g/dL (ref 30.0–36.0)
MCV: 96.1 fL (ref 80.0–100.0)
Monocytes Absolute: 0.5 10*3/uL (ref 0.1–1.0)
Monocytes Relative: 5 %
Neutro Abs: 5.4 10*3/uL (ref 1.7–7.7)
Neutrophils Relative %: 50 %
Platelets: 326 10*3/uL (ref 150–400)
RBC: 3.87 MIL/uL (ref 3.87–5.11)
RDW: 12.9 % (ref 11.5–15.5)
WBC: 10.4 10*3/uL (ref 4.0–10.5)
nRBC: 0 % (ref 0.0–0.2)

## 2020-06-09 LAB — BASIC METABOLIC PANEL
Anion gap: 9 (ref 5–15)
BUN: 9 mg/dL (ref 6–20)
CO2: 25 mmol/L (ref 22–32)
Calcium: 9.2 mg/dL (ref 8.9–10.3)
Chloride: 100 mmol/L (ref 98–111)
Creatinine, Ser: 0.83 mg/dL (ref 0.44–1.00)
GFR, Estimated: >60 mL/min (ref >60)
Glucose, Bld: 127 mg/dL — ABNORMAL HIGH (ref 70–99)
Potassium: 3.7 mmol/L (ref 3.5–5.1)
Sodium: 134 mmol/L — ABNORMAL LOW (ref 135–145)

## 2020-06-09 LAB — RAPID URINE DRUG SCREEN, HOSP PERFORMED
Amphetamines: NOT DETECTED
Barbiturates: NOT DETECTED
Benzodiazepines: POSITIVE — AB
Cocaine: NOT DETECTED
Opiates: POSITIVE — AB
Tetrahydrocannabinol: POSITIVE — AB

## 2020-06-10 LAB — SARS CORONAVIRUS 2 (TAT 6-24 HRS): SARS Coronavirus 2: NEGATIVE

## 2020-06-11 ENCOUNTER — Ambulatory Visit (HOSPITAL_COMMUNITY): Payer: BC Managed Care – PPO | Admitting: Anesthesiology

## 2020-06-11 ENCOUNTER — Ambulatory Visit (HOSPITAL_COMMUNITY)
Admission: RE | Admit: 2020-06-11 | Discharge: 2020-06-11 | Disposition: A | Discharge status: Home / Self Care | Payer: BC Managed Care – PPO | Attending: Gastroenterology | Admitting: Gastroenterology

## 2020-06-11 ENCOUNTER — Encounter (HOSPITAL_COMMUNITY): Payer: Self-pay | Admitting: Gastroenterology

## 2020-06-11 ENCOUNTER — Encounter (HOSPITAL_COMMUNITY)
Admission: RE | Disposition: A | Discharge status: Home / Self Care | Payer: Self-pay | Source: Home / Self Care | Attending: Gastroenterology

## 2020-06-11 DIAGNOSIS — F319 Bipolar disorder, unspecified: Secondary | ICD-10-CM | POA: Insufficient documentation

## 2020-06-11 DIAGNOSIS — B3781 Candidal esophagitis: Secondary | ICD-10-CM | POA: Insufficient documentation

## 2020-06-11 DIAGNOSIS — G8929 Other chronic pain: Secondary | ICD-10-CM | POA: Diagnosis not present

## 2020-06-11 DIAGNOSIS — Z681 Body mass index (BMI) 19 or less, adult: Secondary | ICD-10-CM | POA: Insufficient documentation

## 2020-06-11 DIAGNOSIS — K219 Gastro-esophageal reflux disease without esophagitis: Secondary | ICD-10-CM | POA: Insufficient documentation

## 2020-06-11 DIAGNOSIS — R112 Nausea with vomiting, unspecified: Secondary | ICD-10-CM | POA: Diagnosis not present

## 2020-06-11 DIAGNOSIS — G4733 Obstructive sleep apnea (adult) (pediatric): Secondary | ICD-10-CM | POA: Insufficient documentation

## 2020-06-11 DIAGNOSIS — Z79890 Hormone replacement therapy (postmenopausal): Secondary | ICD-10-CM | POA: Insufficient documentation

## 2020-06-11 DIAGNOSIS — R627 Adult failure to thrive: Secondary | ICD-10-CM | POA: Insufficient documentation

## 2020-06-11 DIAGNOSIS — Z09 Encounter for follow-up examination after completed treatment for conditions other than malignant neoplasm: Secondary | ICD-10-CM

## 2020-06-11 DIAGNOSIS — F17210 Nicotine dependence, cigarettes, uncomplicated: Secondary | ICD-10-CM | POA: Diagnosis not present

## 2020-06-11 DIAGNOSIS — E43 Unspecified severe protein-calorie malnutrition: Secondary | ICD-10-CM | POA: Diagnosis not present

## 2020-06-11 DIAGNOSIS — R1010 Upper abdominal pain, unspecified: Secondary | ICD-10-CM | POA: Diagnosis not present

## 2020-06-11 DIAGNOSIS — Z1211 Encounter for screening for malignant neoplasm of colon: Secondary | ICD-10-CM | POA: Insufficient documentation

## 2020-06-11 DIAGNOSIS — Z9119 Patient's noncompliance with other medical treatment and regimen: Secondary | ICD-10-CM

## 2020-06-11 DIAGNOSIS — K580 Irritable bowel syndrome with diarrhea: Secondary | ICD-10-CM | POA: Insufficient documentation

## 2020-06-11 DIAGNOSIS — Z7951 Long term (current) use of inhaled steroids: Secondary | ICD-10-CM | POA: Insufficient documentation

## 2020-06-11 DIAGNOSIS — K2289 Other specified disease of esophagus: Secondary | ICD-10-CM | POA: Diagnosis not present

## 2020-06-11 DIAGNOSIS — Z885 Allergy status to narcotic agent status: Secondary | ICD-10-CM | POA: Insufficient documentation

## 2020-06-11 DIAGNOSIS — J449 Chronic obstructive pulmonary disease, unspecified: Secondary | ICD-10-CM | POA: Diagnosis not present

## 2020-06-11 DIAGNOSIS — Z86010 Personal history of colonic polyps: Secondary | ICD-10-CM | POA: Insufficient documentation

## 2020-06-11 DIAGNOSIS — M6284 Sarcopenia: Secondary | ICD-10-CM | POA: Insufficient documentation

## 2020-06-11 DIAGNOSIS — Z9682 Presence of neurostimulator: Secondary | ICD-10-CM | POA: Insufficient documentation

## 2020-06-11 DIAGNOSIS — Z8541 Personal history of malignant neoplasm of cervix uteri: Secondary | ICD-10-CM | POA: Diagnosis not present

## 2020-06-11 DIAGNOSIS — Z20822 Contact with and (suspected) exposure to covid-19: Secondary | ICD-10-CM | POA: Insufficient documentation

## 2020-06-11 DIAGNOSIS — I10 Essential (primary) hypertension: Secondary | ICD-10-CM | POA: Insufficient documentation

## 2020-06-11 DIAGNOSIS — R109 Unspecified abdominal pain: Secondary | ICD-10-CM | POA: Diagnosis not present

## 2020-06-11 DIAGNOSIS — R569 Unspecified convulsions: Secondary | ICD-10-CM | POA: Insufficient documentation

## 2020-06-11 DIAGNOSIS — Z888 Allergy status to other drugs, medicaments and biological substances status: Secondary | ICD-10-CM | POA: Insufficient documentation

## 2020-06-11 DIAGNOSIS — M797 Fibromyalgia: Secondary | ICD-10-CM | POA: Insufficient documentation

## 2020-06-11 DIAGNOSIS — Z79899 Other long term (current) drug therapy: Secondary | ICD-10-CM | POA: Insufficient documentation

## 2020-06-11 HISTORY — PX: ESOPHAGOGASTRODUODENOSCOPY (EGD) WITH PROPOFOL: SHX5813

## 2020-06-11 HISTORY — PX: BIOPSY: SHX5522

## 2020-06-11 SURGERY — ESOPHAGOGASTRODUODENOSCOPY (EGD) WITH PROPOFOL
Anesthesia: General

## 2020-06-11 MED ORDER — PROPOFOL 10 MG/ML IV BOLUS
INTRAVENOUS | Status: AC
  Filled 2020-06-11: qty 20

## 2020-06-11 MED ORDER — PROPOFOL 500 MG/50ML IV EMUL
INTRAVENOUS | Status: DC | PRN
  Administered 2020-06-11: 150 ug/kg/min via INTRAVENOUS

## 2020-06-11 MED ORDER — PROPOFOL 10 MG/ML IV BOLUS
INTRAVENOUS | Status: DC | PRN
  Administered 2020-06-11 (×2): 50 mg via INTRAVENOUS

## 2020-06-11 MED ORDER — MIDAZOLAM HCL 5 MG/5ML IJ SOLN
INTRAMUSCULAR | Status: DC | PRN
  Administered 2020-06-11: 2 mg via INTRAVENOUS

## 2020-06-11 MED ORDER — MIDAZOLAM HCL 2 MG/2ML IJ SOLN
INTRAMUSCULAR | Status: AC
  Filled 2020-06-11: qty 2

## 2020-06-11 MED ORDER — LACTATED RINGERS IV SOLN
INTRAVENOUS | Status: DC
  Administered 2020-06-11: 1000 mL via INTRAVENOUS

## 2020-06-11 NOTE — Op Note (Signed)
[facility] Patient Name: [name] Procedure Date: 06/11/2020 7:24 AM MRN: [id number] Date of Birth: [birth year] Attending MD: Maylon Peppers ,  CSN: [id number] Age: 45 Admit Type: Outpatient Procedure:                Upper GI endoscopy Indications:              Upper abdominal pain, Nausea with vomiting Providers:                Maylon Peppers, Janeece Riggers, RN, Raphael Gibney,                            Technician Referring MD:              Medicines:                Monitored Anesthesia Care Complications:            No immediate complications. Estimated Blood Loss:     Estimated blood loss: none. Procedure:                Pre-Anesthesia Assessment:                           - Prior to the procedure, a History and Physical                            was performed, and patient medications, allergies                            and sensitivities were reviewed. The patient's                            tolerance of previous anesthesia was reviewed.                           - The risks and benefits of the procedure and the                            sedation options and risks were discussed with the                            patient. All questions were answered and informed                            consent was obtained.                           - ASA Grade Assessment: III - A patient with severe                            systemic disease.                           After obtaining informed consent, the endoscope was                            passed under direct vision. Throughout the  procedure, the patient's blood pressure, pulse, and                            oxygen saturations were monitored continuously. The                            [id number]-H190 ([id number]) scope was introduced through the                            mouth, and advanced to the second part of duodenum.                            The upper GI endoscopy was accomplished without                             difficulty. The patient tolerated the procedure                            well. Scope In: 7:39:54 AM Scope Out: 7:50:55 AM Total Procedure Duration: 0 hours 11 minutes 1 second  Findings:      White nummular plaques were scattered in the entire esophagus (food vs       candidiasis?). Biopsies were taken with a cold forceps for histology.      The entire examined stomach was normal.      The examined duodenum was normal. Biopsies for histology were taken with       a cold forceps for evaluation of celiac disease. Impression:               - White nummular plaques in esophageal mucosa.                            Biopsied.                           - Normal stomach.                           - Normal examined duodenum. Biopsied. Moderate Sedation:      Per Anesthesia Care Recommendation:           - Discharge patient to home (ambulatory).                           - Resume previous diet.                           - Await pathology results. Procedure Code(s):        --- Professional ---                           [phone], Esophagogastroduodenoscopy, flexible,                            transoral; with biopsy, single or multiple Diagnosis Code(s):        --- Professional ---  K22.8, Other specified diseases of esophagus                           R10.10, Upper abdominal pain, unspecified                           R11.2, Nausea with vomiting, unspecified CPT copyright 2019 American Medical Association. All rights reserved. The codes documented in this report are preliminary and upon coder review may  be revised to meet current compliance requirements. Maylon Peppers, MD Maylon Peppers,  06/11/2020 7:55:29 AM This report has been signed electronically. Number of Addenda: 0

## 2020-06-11 NOTE — Discharge Instructions (Signed)
Discharge patient to home  Resume previous diet.  Repeat colonoscopy at the next available appointment because the bowel preparation was suboptimal - will need a two day prep with magnesium citrate.  Follow up pathology report  PATIENT INSTRUCTIONS POST-ANESTHESIA  IMMEDIATELY FOLLOWING SURGERY:  Do not drive or operate machinery for the first twenty four hours after surgery.  Do not make any important decisions for twenty four hours after surgery or while taking narcotic pain medications or sedatives.  If you develop intractable nausea and vomiting or a severe headache please notify your doctor immediately.  FOLLOW-UP:  Please make an appointment with your surgeon as instructed. You do not need to follow up with anesthesia unless specifically instructed to do so.  WOUND CARE INSTRUCTIONS (if applicable):  Keep a dry clean dressing on the anesthesia/puncture wound site if there is drainage.  Once the wound has quit draining you may leave it open to air.  Generally you should leave the bandage intact for twenty four hours unless there is drainage.  If the epidural site drains for more than 36-48 hours please call the anesthesia department.  QUESTIONS?:  Please feel free to call your physician or the hospital operator if you have any questions, and they will be happy to assist you.       Upper Endoscopy, Adult, Care After This sheet gives you information about how to care for yourself after your procedure. Your health care provider may also give you more specific instructions. If you have problems or questions, contact your health care provider. What can I expect after the procedure? After the procedure, it is common to have:  A sore throat.  Mild stomach pain or discomfort.  Bloating.  Nausea. Follow these instructions at home:  Follow instructions from your health care provider about what to eat or drink after your procedure.  Return to your normal activities as told by your health  care provider. Ask your health care provider what activities are safe for you.  Take over-the-counter and prescription medicines only as told by your health care provider.  If you were given a sedative during the procedure, it can affect you for several hours. Do not drive or operate machinery until your health care provider says that it is safe.  Keep all follow-up visits as told by your health care provider. This is important.   Contact a health care provider if you have:  A sore throat that lasts longer than one day.  Trouble swallowing. Get help right away if:  You vomit blood or your vomit looks like coffee grounds.  You have: ? A fever. ? Bloody, black, or tarry stools. ? A severe sore throat or you cannot swallow. ? Difficulty breathing. ? Severe pain in your chest or abdomen. Summary  After the procedure, it is common to have a sore throat, mild stomach discomfort, bloating, and nausea.  If you were given a sedative during the procedure, it can affect you for several hours. Do not drive or operate machinery until your health care provider says that it is safe.  Follow instructions from your health care provider about what to eat or drink after your procedure.  Return to your normal activities as told by your health care provider. This information is not intended to replace advice given to you by your health care provider. Make sure you discuss any questions you have with your health care provider. Document Revised: 03/25/2019 Document Reviewed: 08/27/2017 Elsevier Patient Education  2021 [employer].

## 2020-06-11 NOTE — Anesthesia Preprocedure Evaluation (Signed)
Anesthesia Evaluation  Patient identified by MRN, date of birth, ID band Patient awake    Reviewed: Allergy & Precautions, NPO status , Patient's Chart, lab work & pertinent test results  History of Anesthesia Complications (+) PONV and history of anesthetic complications  Airway Mallampati: II  TM Distance: >3 FB Neck ROM: Full    Dental  (+) Edentulous Upper, Edentulous Lower   Pulmonary asthma , sleep apnea , pneumonia, COPD,  COPD inhaler, Current Smoker and Patient abstained from smoking.,    Pulmonary exam normal breath sounds clear to auscultation       Cardiovascular Exercise Tolerance: Poor hypertension, Pt. on medications Normal cardiovascular exam Rhythm:Regular Rate:Normal     Neuro/Psych  Headaches, Seizures -, Well Controlled,  PSYCHIATRIC DISORDERS Anxiety Depression Bipolar Disorder  Neuromuscular disease    GI/Hepatic GERD  Medicated,  Endo/Other  Hyperthyroidism   Renal/GU Renal InsufficiencyRenal disease     Musculoskeletal  (+) Arthritis  (spinal cord stimulator), Fibromyalgia -  Abdominal   Peds  Hematology  (+) anemia ,   Anesthesia Other Findings   Reproductive/Obstetrics                            Anesthesia Physical Anesthesia Plan  ASA: III  Anesthesia Plan: General   Post-op Pain Management:    Induction: Intravenous  PONV Risk Score and Plan: TIVA  Airway Management Planned: Nasal Cannula and Natural Airway  Additional Equipment:   Intra-op Plan:   Post-operative Plan:   Informed Consent: I have reviewed the patients History and Physical, chart, labs and discussed the procedure including the risks, benefits and alternatives for the proposed anesthesia with the patient or authorized representative who has indicated his/her understanding and acceptance.     Dental advisory given  Plan Discussed with: CRNA and Surgeon  Anesthesia Plan Comments:          Anesthesia Quick Evaluation

## 2020-06-11 NOTE — Anesthesia Postprocedure Evaluation (Signed)
Anesthesia Post Note  Patient: [name]  Procedure(s) Performed: ESOPHAGOGASTRODUODENOSCOPY (EGD) WITH PROPOFOL (N/A ) BIOPSY  Patient location during evaluation: Phase II Anesthesia Type: General Level of consciousness: awake, oriented, awake and alert and patient cooperative Pain management: satisfactory to patient Vital Signs Assessment: post-procedure vital signs reviewed and stable Respiratory status: spontaneous breathing, respiratory function stable and nonlabored ventilation Cardiovascular status: stable Postop Assessment: no apparent nausea or vomiting Anesthetic complications: no   No complications documented.   Last Vitals:  Vitals:   06/11/20 0649  BP: 109/77  Resp: 14  Temp: (!) 36.4 C  SpO2: 99%    Last Pain:  Vitals:   06/11/20 0649  TempSrc: Oral  PainSc: 5                  Elexius Minar

## 2020-06-11 NOTE — Transfer of Care (Signed)
Immediate Anesthesia Transfer of Care Note  Patient: [name]  Procedure(s) Performed: ESOPHAGOGASTRODUODENOSCOPY (EGD) WITH PROPOFOL (N/A ) BIOPSY  Patient Location: PACU  Anesthesia Type:General  Level of Consciousness: awake, alert , oriented and patient cooperative  Airway & Oxygen Therapy: Patient Spontanous Breathing and Patient connected to nasal cannula oxygen  Post-op Assessment: Report given to RN, Post -op Vital signs reviewed and stable and Patient moving all extremities X 4  Post vital signs: Reviewed and stable  Last Vitals:  Vitals Value Taken Time  BP    Temp    Pulse    Resp    SpO2      Last Pain:  Vitals:   06/11/20 0649  TempSrc: Oral  PainSc: 5       Patients Stated Pain Goal: 7 (04/19/01 4961)  Complications: No complications documented.

## 2020-06-11 NOTE — Op Note (Signed)
[facility] Patient Name: [name] Procedure Date: 06/11/2020 7:55 AM MRN: [id number] Date of Birth: March 03, 1976 Attending MD: Maylon Peppers ,  CSN: [id number] Age: 45 Admit Type: Outpatient Procedure:                Flexible Sigmoidoscopy Indications:              High risk colon cancer surveillance: Personal                            history of colonic polyps Providers:                Maylon Peppers, Janeece Riggers, RN, Raphael Gibney,                            Technician Referring MD:              Medicines:                Monitored Anesthesia Care Complications:            No immediate complications. Estimated Blood Loss:     Estimated blood loss: none. Procedure:                Pre-Anesthesia Assessment:                           - Prior to the procedure, a History and Physical                            was performed, and patient medications, allergies                            and sensitivities were reviewed. The patient's                            tolerance of previous anesthesia was reviewed.                           - The risks and benefits of the procedure and the                            sedation options and risks were discussed with the                            patient. All questions were answered and informed                            consent was obtained.                           - ASA Grade Assessment: III - A patient with severe                            systemic disease.                           After obtaining informed consent, the scope was  passed under direct vision. The [id number]-[id number]                            ([id number]) scope was introduced through the anus and                            advanced to the 30 cm from the anal verge. After                            obtaining informed consent, the scope was passed                            under direct vision.The flexible sigmoidoscopy was                             performed with difficulty due to poor bowel prep.                            Scope withdrawal time was 1 hour 3 minutes. Scope In: 7:57:05 AM Scope Out: 8:01:38 AM Total Procedure Duration: 0 hours 4 minutes 33 seconds  Findings:      The perianal and digital rectal examinations were normal.      A large amount of solid stool was found in the rectum, in the       recto-sigmoid colon and in the sigmoid colon, precluding visualization. Impression:               - Stool in the rectum, in the recto-sigmoid colon                            and in the sigmoid colon.                           - No specimens collected. Moderate Sedation:      Per Anesthesia Care Recommendation:           - Discharge patient to home (ambulatory).                           - Resume previous diet.                           - Repeat colonoscopy at the next available                            appointment because the bowel preparation was                            suboptimal - will need a two day prep with                            magnesium citrate. Procedure Code(s):        --- Professional ---                           J5009, Colorectal cancer screening; flexible  sigmoidoscopy Diagnosis Code(s):        --- Professional ---                           Z86.010, Personal history of colonic polyps CPT copyright 2019 American Medical Association. All rights reserved. The codes documented in this report are preliminary and upon coder review may  be revised to meet current compliance requirements. Maylon Peppers, MD Maylon Peppers,  06/11/2020 8:06:23 AM This report has been signed electronically. Number of Addenda: 0

## 2020-06-11 NOTE — Interval H&P Note (Signed)
History and Physical Interval Note:  06/11/2020 7:28 AM [name] is a 45 y.o. female with past medical history of bipolar disorder, cervical cancer, COPD, depression, fibromyalgia, GERD, hypertension, irritable bowel syndrome, Seizures, history of substance abuse, OSA, hyperlipidemia, who presents for evaluation of vomiting and abdominal pain  She states having still some abdominal pain her lower abdomen, with occasional nausea.The patient denies having any fever, chills, hematochezia, melena, hematemesis, diarrhea, jaundice, pruritus. She has lost significant amount of weight. Also reports some episodes of pancreatitis in the past.Had abdominal imaging on  12/2018 that did not show any major intraabdominall pathology.   BP 109/77   Temp (!) 97.5 F (36.4 C) (Oral)   Resp 14   Ht 5\' 2"  (1.575 m)   Wt 41.3 kg   SpO2 99%   BMI 16.64 kg/m  GENERAL: The patient is AO x3, in no acute distress. HEENT: Head is normocephalic and atraumatic. EOMI are intact. Mouth is well hydrated and without lesions. NECK: Supple. No masses LUNGS: Clear to auscultation. No presence of rhonchi/wheezing/rales. Adequate chest expansion HEART: RRR, normal s1 and s2. ABDOMEN: Soft, nontender, no guarding, no peritoneal signs, and nondistended. BS +. No masses. EXTREMITIES: Without any cyanosis, clubbing, rash, lesions or edema. NEUROLOGIC: AOx3, no focal motor deficit. SKIN: no jaundice, no rashes  [name]  has presented today for surgery, with the diagnosis of Abdominal Pain, Hx of colon polyps.  The various methods of treatment have been discussed with the patient and family. After consideration of risks, benefits and other options for treatment, the patient has consented to  Procedure(s) with comments: COLONOSCOPY WITH PROPOFOL (N/A) - Am ESOPHAGOGASTRODUODENOSCOPY (EGD) WITH PROPOFOL (N/A) as a surgical intervention.  The patient's history has been reviewed, patient examined, no change in status, stable for  surgery.  I have reviewed the patient's chart and labs.  Questions were answered to the patient's satisfaction.     Maylon Peppers [place]

## 2020-06-14 ENCOUNTER — Telehealth (INDEPENDENT_AMBULATORY_CARE_PROVIDER_SITE_OTHER): Payer: Self-pay | Admitting: *Deleted

## 2020-06-14 ENCOUNTER — Other Ambulatory Visit (INDEPENDENT_AMBULATORY_CARE_PROVIDER_SITE_OTHER): Payer: Self-pay | Admitting: Gastroenterology

## 2020-06-14 DIAGNOSIS — B3781 Candidal esophagitis: Secondary | ICD-10-CM

## 2020-06-14 LAB — SURGICAL PATHOLOGY

## 2020-06-14 MED ORDER — FLUCONAZOLE 200 MG PO TABS: 200.0000 mg | ORAL_TABLET | Freq: Every day | ORAL | 0 refills | Status: AC

## 2020-06-14 NOTE — Telephone Encounter (Signed)
Repeat colonoscopy at the next available appointment because the bowel preparation was suboptimal - will need a two day prep with magnesium citrate

## 2020-06-15 NOTE — Telephone Encounter (Signed)
Noted thank you

## 2020-06-17 ENCOUNTER — Encounter (HOSPITAL_COMMUNITY): Payer: Self-pay | Admitting: Gastroenterology

## 2020-06-17 DIAGNOSIS — R339 Retention of urine, unspecified: Secondary | ICD-10-CM | POA: Diagnosis not present

## 2020-06-18 ENCOUNTER — Other Ambulatory Visit: Payer: Self-pay | Admitting: Family Medicine

## 2020-06-18 DIAGNOSIS — R110 Nausea: Secondary | ICD-10-CM

## 2020-06-21 ENCOUNTER — Ambulatory Visit (HOSPITAL_COMMUNITY): Payer: BC Managed Care – PPO | Attending: Orthopedic Surgery

## 2020-06-22 DIAGNOSIS — N941 Unspecified dyspareunia: Secondary | ICD-10-CM | POA: Diagnosis not present

## 2020-06-22 DIAGNOSIS — N951 Menopausal and female climacteric states: Secondary | ICD-10-CM | POA: Diagnosis not present

## 2020-06-22 DIAGNOSIS — N952 Postmenopausal atrophic vaginitis: Secondary | ICD-10-CM | POA: Diagnosis not present

## 2020-06-24 ENCOUNTER — Telehealth: Payer: Self-pay | Admitting: Orthopedic Surgery

## 2020-06-24 ENCOUNTER — Telehealth (INDEPENDENT_AMBULATORY_CARE_PROVIDER_SITE_OTHER): Payer: Self-pay | Admitting: Gastroenterology

## 2020-06-24 DIAGNOSIS — F411 Generalized anxiety disorder: Secondary | ICD-10-CM | POA: Diagnosis not present

## 2020-06-24 DIAGNOSIS — F314 Bipolar disorder, current episode depressed, severe, without psychotic features: Secondary | ICD-10-CM | POA: Diagnosis not present

## 2020-06-24 DIAGNOSIS — S42294D Other nondisplaced fracture of upper end of right humerus, subsequent encounter for fracture with routine healing: Secondary | ICD-10-CM

## 2020-06-24 NOTE — Telephone Encounter (Signed)
Patient states she needs a new OT order.    Can you ask Dr C to sign off on this?  Thanks

## 2020-06-24 NOTE — Telephone Encounter (Signed)
I reviewed the pathology results. I called the patient  to inform about the results, but the patient did not pick up the phone.  I could not leave a voice message for her as she did not have a voicemail set up and her listed contact information was not accurate. Pathology showed presence of Candida esophagitis and normal small bowel biopsies.  No need to repeat EGD.  I will send a prescription for fluconazole to her listed pharmacy.   Thanks,

## 2020-06-24 NOTE — Telephone Encounter (Signed)
I spoke with the patient regarding the note from Dr. Jenetta Downer below she is aware of all and has picked up the medication from her pharmacy.

## 2020-06-24 NOTE — Telephone Encounter (Signed)
Patient called the office regarding test results - please advise - stated to call her moms number and the information can be given to her - ph# [phone]

## 2020-06-25 NOTE — Telephone Encounter (Signed)
Yes please - help her get better  [facility]

## 2020-06-25 NOTE — Telephone Encounter (Signed)
Please advise if ok to send in Occupational Therapy Order?

## 2020-06-28 NOTE — Telephone Encounter (Signed)
I have placed the orders for this patient. She will get a call for therapy.

## 2020-06-29 DIAGNOSIS — G629 Polyneuropathy, unspecified: Secondary | ICD-10-CM | POA: Diagnosis not present

## 2020-06-29 DIAGNOSIS — Z79899 Other long term (current) drug therapy: Secondary | ICD-10-CM | POA: Diagnosis not present

## 2020-06-29 DIAGNOSIS — M5441 Lumbago with sciatica, right side: Secondary | ICD-10-CM | POA: Diagnosis not present

## 2020-06-29 DIAGNOSIS — F17210 Nicotine dependence, cigarettes, uncomplicated: Secondary | ICD-10-CM | POA: Diagnosis not present

## 2020-06-29 DIAGNOSIS — G8929 Other chronic pain: Secondary | ICD-10-CM | POA: Diagnosis not present

## 2020-07-09 DIAGNOSIS — N3941 Urge incontinence: Secondary | ICD-10-CM | POA: Diagnosis not present

## 2020-07-09 DIAGNOSIS — R350 Frequency of micturition: Secondary | ICD-10-CM | POA: Diagnosis not present

## 2020-07-09 DIAGNOSIS — N3010 Interstitial cystitis (chronic) without hematuria: Secondary | ICD-10-CM | POA: Diagnosis not present

## 2020-07-09 DIAGNOSIS — R3915 Urgency of urination: Secondary | ICD-10-CM | POA: Diagnosis not present

## 2020-07-09 DIAGNOSIS — M7918 Myalgia, other site: Secondary | ICD-10-CM | POA: Diagnosis not present

## 2020-07-19 DIAGNOSIS — R339 Retention of urine, unspecified: Secondary | ICD-10-CM | POA: Diagnosis not present

## 2020-08-03 DIAGNOSIS — G629 Polyneuropathy, unspecified: Secondary | ICD-10-CM | POA: Diagnosis not present

## 2020-08-03 DIAGNOSIS — F17210 Nicotine dependence, cigarettes, uncomplicated: Secondary | ICD-10-CM | POA: Diagnosis not present

## 2020-08-03 DIAGNOSIS — M5441 Lumbago with sciatica, right side: Secondary | ICD-10-CM | POA: Diagnosis not present

## 2020-08-03 DIAGNOSIS — G8929 Other chronic pain: Secondary | ICD-10-CM | POA: Diagnosis not present

## 2020-08-03 DIAGNOSIS — Z79899 Other long term (current) drug therapy: Secondary | ICD-10-CM | POA: Diagnosis not present

## 2020-08-10 ENCOUNTER — Other Ambulatory Visit: Payer: Self-pay | Admitting: Family Medicine

## 2020-08-10 DIAGNOSIS — J209 Acute bronchitis, unspecified: Secondary | ICD-10-CM

## 2020-08-10 DIAGNOSIS — J440 Chronic obstructive pulmonary disease with acute lower respiratory infection: Secondary | ICD-10-CM

## 2020-08-11 ENCOUNTER — Other Ambulatory Visit: Payer: Self-pay | Admitting: Medical

## 2020-08-16 DIAGNOSIS — R339 Retention of urine, unspecified: Secondary | ICD-10-CM | POA: Diagnosis not present

## 2020-08-23 ENCOUNTER — Ambulatory Visit (INDEPENDENT_AMBULATORY_CARE_PROVIDER_SITE_OTHER): Payer: BC Managed Care – PPO | Admitting: Gastroenterology

## 2020-09-03 DIAGNOSIS — F314 Bipolar disorder, current episode depressed, severe, without psychotic features: Secondary | ICD-10-CM | POA: Diagnosis not present

## 2020-09-03 DIAGNOSIS — F411 Generalized anxiety disorder: Secondary | ICD-10-CM | POA: Diagnosis not present

## 2020-09-03 DIAGNOSIS — F5101 Primary insomnia: Secondary | ICD-10-CM | POA: Diagnosis not present

## 2020-09-10 DIAGNOSIS — E559 Vitamin D deficiency, unspecified: Secondary | ICD-10-CM | POA: Diagnosis not present

## 2020-09-10 DIAGNOSIS — M5441 Lumbago with sciatica, right side: Secondary | ICD-10-CM | POA: Diagnosis not present

## 2020-09-10 DIAGNOSIS — G8929 Other chronic pain: Secondary | ICD-10-CM | POA: Diagnosis not present

## 2020-09-10 DIAGNOSIS — Z79899 Other long term (current) drug therapy: Secondary | ICD-10-CM | POA: Diagnosis not present

## 2020-09-10 DIAGNOSIS — M129 Arthropathy, unspecified: Secondary | ICD-10-CM | POA: Diagnosis not present

## 2020-09-10 DIAGNOSIS — F17210 Nicotine dependence, cigarettes, uncomplicated: Secondary | ICD-10-CM | POA: Diagnosis not present

## 2020-09-10 DIAGNOSIS — Z1159 Encounter for screening for other viral diseases: Secondary | ICD-10-CM | POA: Diagnosis not present

## 2020-09-14 DIAGNOSIS — K219 Gastro-esophageal reflux disease without esophagitis: Secondary | ICD-10-CM | POA: Diagnosis not present

## 2020-09-14 DIAGNOSIS — Z79899 Other long term (current) drug therapy: Secondary | ICD-10-CM | POA: Diagnosis not present

## 2020-09-14 DIAGNOSIS — J449 Chronic obstructive pulmonary disease, unspecified: Secondary | ICD-10-CM | POA: Diagnosis not present

## 2020-09-14 DIAGNOSIS — G43111 Migraine with aura, intractable, with status migrainosus: Secondary | ICD-10-CM | POA: Diagnosis not present

## 2020-09-14 DIAGNOSIS — M5459 Other low back pain: Secondary | ICD-10-CM | POA: Diagnosis not present

## 2020-09-15 DIAGNOSIS — R339 Retention of urine, unspecified: Secondary | ICD-10-CM | POA: Diagnosis not present

## 2020-10-05 DIAGNOSIS — M25551 Pain in right hip: Secondary | ICD-10-CM | POA: Diagnosis not present

## 2020-10-05 DIAGNOSIS — K529 Noninfective gastroenteritis and colitis, unspecified: Secondary | ICD-10-CM | POA: Diagnosis not present

## 2020-10-05 DIAGNOSIS — Z681 Body mass index (BMI) 19 or less, adult: Secondary | ICD-10-CM | POA: Diagnosis not present

## 2020-10-14 ENCOUNTER — Ambulatory Visit: Payer: BC Managed Care – PPO | Admitting: Orthopaedic Surgery

## 2020-10-15 DIAGNOSIS — R339 Retention of urine, unspecified: Secondary | ICD-10-CM | POA: Diagnosis not present

## 2020-10-18 ENCOUNTER — Encounter (INDEPENDENT_AMBULATORY_CARE_PROVIDER_SITE_OTHER): Payer: Self-pay | Admitting: Gastroenterology

## 2020-10-18 ENCOUNTER — Ambulatory Visit (INDEPENDENT_AMBULATORY_CARE_PROVIDER_SITE_OTHER): Payer: BC Managed Care – PPO | Admitting: Gastroenterology

## 2020-10-21 ENCOUNTER — Other Ambulatory Visit: Payer: Self-pay

## 2020-10-21 ENCOUNTER — Ambulatory Visit: Payer: Self-pay

## 2020-10-21 ENCOUNTER — Ambulatory Visit (INDEPENDENT_AMBULATORY_CARE_PROVIDER_SITE_OTHER): Payer: BC Managed Care – PPO | Admitting: Orthopaedic Surgery

## 2020-10-21 ENCOUNTER — Encounter: Payer: Self-pay | Admitting: Orthopaedic Surgery

## 2020-10-21 VITALS — Ht 63.0 in | Wt 92.0 lb

## 2020-10-21 DIAGNOSIS — F17210 Nicotine dependence, cigarettes, uncomplicated: Secondary | ICD-10-CM | POA: Diagnosis not present

## 2020-10-21 DIAGNOSIS — M5441 Lumbago with sciatica, right side: Secondary | ICD-10-CM

## 2020-10-21 DIAGNOSIS — Z79899 Other long term (current) drug therapy: Secondary | ICD-10-CM | POA: Diagnosis not present

## 2020-10-21 DIAGNOSIS — G8929 Other chronic pain: Secondary | ICD-10-CM

## 2020-10-21 DIAGNOSIS — E559 Vitamin D deficiency, unspecified: Secondary | ICD-10-CM | POA: Diagnosis not present

## 2020-10-21 NOTE — Progress Notes (Signed)
Office Visit Note   Patient: [name]           Date of Birth: [birth year]           MRN: [id number] Visit Date: 10/21/2020              Requested by: Neale Burly, MD [place],  [place] [id number] PCP: Neale Burly, MD   Assessment & Plan: Visit Diagnoses:  1. Chronic right-sided low back pain with right-sided sciatica     Plan: Test test  We reviewed multiple CT scans she has had since 2018.  These show superior endplate injury L5.  Which is unchangedShe is neurologically intact.  She asked about what she  is supposed to do until she gets into another pain clinic since she does not have any narcotics.  We discussed general overall nutrition with increased p.o. intake.Discussed patient we do not do chronic narcotic medication.  Follow-Up Instructions: No follow-ups on file.   Orders:  Orders Placed This Encounter  Procedures   XR Lumbar Spine 2-3 Views   XR Pelvis 1-2 Views   XR Lumbar Spine 2-3 Views   No orders of the defined types were placed in this encounter.     Procedures: No procedures performed   Clinical Data: No additional findings.   Subjective: Chief Complaint  Patient presents with   Lower Back - Pain    My back hurts because something popped in my hip   Right Hip - Pain    Something popped out in my hip and it makes my hip hurt awful    HPI 45 year old female complains of chronic back pain chronic right lateral hip pain.  She states her husband has to pick her up and place her lower back in extension and her hip pops and makes it better.  She is taken over-the-counter pain medication which has not helped.  Past pain clinic patient she states she was terminated when her drug screen came back positive for morphine.  She states she is allergic to morphine.  PDMP review showed she had had Tylenol No. 4.  Last prescription 09/10/2020 listed in PDMP for 90 tablets hydrocodone 5/325.  Past history of bladder stimulator for interstitial  cystitis.  She has had problems with acid reflux alcoholism with pancreatitis depression anxiety migraines.  Multiple OB/GYN and urologic procedures.  Bladder stimulator removed since she was too thin and she states she has to get back to 135 pounds to get it put back in.  Review of Systems all other systems noncontributory to HPI.   Objective: Vital Signs: Ht 5\' 3"  (1.6 m)   Wt 92 lb (41.7 kg)   BMI 16.30 kg/m   Physical Exam Constitutional:      Appearance: She is well-developed.     Comments: Patient thin.  Constantly moving and changing position  HENT:     Head: Normocephalic.     Right Ear: External ear normal.     Left Ear: External ear normal. There is no impacted cerumen.  Eyes:     Pupils: Pupils are equal, round, and reactive to light.  Neck:     Thyroid: No thyromegaly.     Trachea: No tracheal deviation.  Cardiovascular:     Rate and Rhythm: Normal rate.  Pulmonary:     Effort: Pulmonary effort is normal.  Abdominal:     Palpations: Abdomen is soft.  Musculoskeletal:     Cervical back: No rigidity.  Skin:  General: Skin is warm and dry.  Neurological:     Mental Status: She is alert and oriented to person, place, and time.  Psychiatric:        Behavior: Behavior normal.    Ortho Exam negative logroll of the hips negative straight leg raising 90 degrees.  Anterior tib gastrocsoleus is active.  Pelvis is level lumbar spine is normal.  Specialty Comments:  No specialty comments available.  Imaging: No results found.   PMFS History: Patient Active Problem List   Diagnosis Date Noted   Nausea without vomiting 05/24/2020   Abdominal pain 05/24/2020   Severe protein-calorie malnutrition ([place]) 05/24/2020   Encounter by telehealth for suspected COVID-19 03/20/2019   Abdominal pain, epigastric 10/07/2018   Intractable vomiting 08/09/2018   Compression fracture of fifth lumbar vertebra ([place]) 02/07/2018   Altered mental status 02/07/2018   Generalized  weakness 01/24/2018   MDD (major depressive disorder) 04/23/2017   Substance abuse ([place]) 04/19/2017   Hyperthyroidism 02/17/2015   Anxiety 01/28/2015   Clinical depression 01/28/2015   COPD with acute exacerbation ([place]) 11/26/2013   Vaginitis and vulvovaginitis 11/26/2013   Recurrent pneumonia 02/02/2013   Left arm pain 01/13/2013   Left arm pain 01/13/2013    Class: Acute   Dizziness and giddiness 01/13/2013   Acute bronchitis 12/07/2012   Rib pain 12/07/2012   Insomnia 08/26/2012   Diarrhea 06/19/2012   Heme positive stool 06/19/2012   Bloating 06/19/2012   Nausea alone 06/19/2012   Bladder retention 04/24/2012   Tremor 03/05/2012   Dysuria 03/05/2012   Failure to thrive in adult 06/21/2011   Airway hyperreactivity 03/08/2011   Back pain, chronic 03/08/2011   Chronic obstructive pulmonary disease ([place]) 03/08/2011   Acid reflux 03/08/2011   Cystitis 01/22/2011   Bilateral hand numbness 01/13/2011   WEIGHT LOSS 06/24/2010   RIB PAIN, LEFT SIDED 05/04/2010   ABDOMINAL PAIN, LEFT UPPER QUADRANT 05/04/2010   Unspecified otitis media 10/26/2009   BRONCHITIS, RECURRENT 08/23/2009   URI, ACUTE 08/04/2009   FATIGUE 07/06/2009   IBS 03/11/2009   ANAL FISSURE 03/11/2009   RECTAL BLEEDING 03/11/2009   Chronic interstitial cystitis 03/11/2009   COLONIC POLYPS, HX OF 03/11/2009   GERD 02/05/2009   SMOKER 12/04/2008   chronic asthma poorly controlled 12/04/2008   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   BENZODIAZEPINE ADDICTION 08/31/2007   Bipolar disorder ([place]) 08/31/2007   HYPERTENSION 08/31/2007   EMPHYSEMA 08/31/2007   NEPHROLITHIASIS 08/31/2007   ARTHRITIS 08/31/2007   FIBROMYALGIA 08/31/2007   SLEEP APNEA 08/31/2007   Past Medical History:  Diagnosis Date   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   Anal fissure 03/11/2009   Anemia    Anxiety    Anxiety and depression    ARTHRITIS 08/31/2007   Arthritis    Asthma    BENZODIAZEPINE ADDICTION 08/31/2007   Bipolar 1  disorder ([place])    Bowel obstruction (HCC)    Breakdown of urinary electronic stimulator device, init ([place])    BRONCHITIS, RECURRENT 08/23/2009   Asthmatic Bronchitis-Dr. Melvyn Novas.....-HFA 75% 12/04/2008>75% 02/05/2009>75% 08/04/2009 -PFT's 01/04/2009 2.56 (86%) ratio 75, no resp to B2 and DLC0 67% > 80 after correction    Cancer (HCC)    cervical cancer   Chronic interstitial cystitis 03/11/2009   Chronic nausea    Chronic pain    Colon polyps    COLONIC POLYPS, HX OF 07/25/2006   ADENOMATOUS POLYP   COPD (chronic obstructive pulmonary disease) ([place])    DEPRESSION 08/31/2007   Endometriosis    FIBROMYALGIA  08/31/2007   Fibromyalgia    GERD 02/05/2009   Hyperlipidemia    HYPERTENSION 08/31/2007   IBS 03/11/2009   Internal hemorrhoids    Migraine headache    NEPHROLITHIASIS 08/31/2007   Pancreatitis    PONV (postoperative nausea and vomiting)    RECTAL BLEEDING 03/11/2009   Sciatica    right leg   Seizures (HCC)    been about 1 year since last seisure per pt   SLEEP APNEA 08/31/2007   Substance abuse ([place])    Thyroid disease    Uterine cyst     Family History  Problem Relation Age of Onset   Heart disease Father    Asthma Maternal Grandmother    Emphysema Maternal Grandfather    Cancer Maternal Grandfather        Lung Cancer   Cancer Other        Lung Cancer-Aunt   Colon cancer Neg Hx    Esophageal cancer Neg Hx    Rectal cancer Neg Hx    Stomach cancer Neg Hx    Thyroid disease Neg Hx     Past Surgical History:  Procedure Laterality Date   ABDOMINAL HYSTERECTOMY     BIOPSY  10/25/2018   Procedure: BIOPSY;  Surgeon: Rogene Houston, MD;  [facility];  Service: Endoscopy;;  gastric   BIOPSY  06/11/2020   Procedure: BIOPSY;  Surgeon: Montez Morita, Quillian Quince, MD;  Location: [facility];  Service: Gastroenterology;;  small bowel   bladder stretching x6     BLADDER SURGERY     stimulator placed and stretching    CHOLECYSTECTOMY     COLONOSCOPY     COLONOSCOPY  WITH PROPOFOL N/A 09/03/2014   Procedure: COLONOSCOPY WITH PROPOFOL;  Surgeon: Milus Banister, MD;  Location: [facility];  Service: Endoscopy;  Laterality: N/A;   ESOPHAGOGASTRODUODENOSCOPY (EGD) WITH PROPOFOL N/A 10/25/2018   Procedure: ESOPHAGOGASTRODUODENOSCOPY (EGD) WITH PROPOFOL;  Surgeon: Rogene Houston, MD;  [facility]: [facility];  Service: Endoscopy;  Laterality: N/A;  11:15   ESOPHAGOGASTRODUODENOSCOPY (EGD) WITH PROPOFOL N/A 06/11/2020   Procedure: ESOPHAGOGASTRODUODENOSCOPY (EGD) WITH PROPOFOL;  Surgeon: Harvel Quale, MD;  [facility]: [facility];  Service: Gastroenterology;  Laterality: N/A;   interstitial cystitis     PACEMAKER INSERTION     in hip for interstitial cystitis   pacemaker removal     removal of uterine cyst and scrapped uterus     replaced bladder pacemaker     Social History   Occupational History    Employer: UNEMPLOYED  Tobacco Use   Smoking status: Every Day    Packs/day: 0.50    Years: 14.00    Pack years: 7.00    Types: Cigarettes   Smokeless tobacco: Never  Vaping Use   Vaping Use: Never used  Substance and Sexual Activity   Alcohol use: Not Currently    Comment: last drink 3 weeks ago; recently released from rehab   Drug use: Not Currently    Types: Marijuana    Comment: once a month   Sexual activity: Not on file

## 2020-11-08 ENCOUNTER — Other Ambulatory Visit: Payer: Self-pay | Admitting: Family Medicine

## 2020-11-08 DIAGNOSIS — R110 Nausea: Secondary | ICD-10-CM

## 2020-11-23 DIAGNOSIS — Z79899 Other long term (current) drug therapy: Secondary | ICD-10-CM | POA: Diagnosis not present

## 2020-11-23 DIAGNOSIS — G8929 Other chronic pain: Secondary | ICD-10-CM | POA: Diagnosis not present

## 2020-11-23 DIAGNOSIS — E559 Vitamin D deficiency, unspecified: Secondary | ICD-10-CM | POA: Diagnosis not present

## 2020-11-23 DIAGNOSIS — R1084 Generalized abdominal pain: Secondary | ICD-10-CM | POA: Diagnosis not present

## 2020-11-23 DIAGNOSIS — F17210 Nicotine dependence, cigarettes, uncomplicated: Secondary | ICD-10-CM | POA: Diagnosis not present

## 2020-11-23 DIAGNOSIS — M5441 Lumbago with sciatica, right side: Secondary | ICD-10-CM | POA: Diagnosis not present

## 2020-11-24 DIAGNOSIS — F5101 Primary insomnia: Secondary | ICD-10-CM | POA: Diagnosis not present

## 2020-11-24 DIAGNOSIS — F314 Bipolar disorder, current episode depressed, severe, without psychotic features: Secondary | ICD-10-CM | POA: Diagnosis not present

## 2020-11-24 DIAGNOSIS — R1084 Generalized abdominal pain: Secondary | ICD-10-CM | POA: Diagnosis not present

## 2020-11-24 DIAGNOSIS — F411 Generalized anxiety disorder: Secondary | ICD-10-CM | POA: Diagnosis not present

## 2020-11-24 DIAGNOSIS — E559 Vitamin D deficiency, unspecified: Secondary | ICD-10-CM | POA: Diagnosis not present

## 2020-12-01 DIAGNOSIS — H5319 Other subjective visual disturbances: Secondary | ICD-10-CM | POA: Diagnosis not present

## 2020-12-23 DIAGNOSIS — G8929 Other chronic pain: Secondary | ICD-10-CM | POA: Diagnosis not present

## 2020-12-23 DIAGNOSIS — F17210 Nicotine dependence, cigarettes, uncomplicated: Secondary | ICD-10-CM | POA: Diagnosis not present

## 2020-12-23 DIAGNOSIS — Z79899 Other long term (current) drug therapy: Secondary | ICD-10-CM | POA: Diagnosis not present

## 2020-12-23 DIAGNOSIS — Z681 Body mass index (BMI) 19 or less, adult: Secondary | ICD-10-CM | POA: Diagnosis not present

## 2020-12-23 DIAGNOSIS — M5441 Lumbago with sciatica, right side: Secondary | ICD-10-CM | POA: Diagnosis not present

## 2020-12-28 ENCOUNTER — Other Ambulatory Visit: Payer: Self-pay | Admitting: Medical

## 2020-12-28 ENCOUNTER — Other Ambulatory Visit: Payer: Self-pay | Admitting: Family Medicine

## 2020-12-28 DIAGNOSIS — J440 Chronic obstructive pulmonary disease with acute lower respiratory infection: Secondary | ICD-10-CM

## 2020-12-28 DIAGNOSIS — J209 Acute bronchitis, unspecified: Secondary | ICD-10-CM

## 2021-01-03 DIAGNOSIS — S93401A Sprain of unspecified ligament of right ankle, initial encounter: Secondary | ICD-10-CM | POA: Diagnosis not present

## 2021-01-03 DIAGNOSIS — M79671 Pain in right foot: Secondary | ICD-10-CM | POA: Diagnosis not present

## 2021-01-03 DIAGNOSIS — M7989 Other specified soft tissue disorders: Secondary | ICD-10-CM | POA: Diagnosis not present

## 2021-01-03 DIAGNOSIS — J45909 Unspecified asthma, uncomplicated: Secondary | ICD-10-CM | POA: Diagnosis not present

## 2021-01-03 DIAGNOSIS — J449 Chronic obstructive pulmonary disease, unspecified: Secondary | ICD-10-CM | POA: Diagnosis not present

## 2021-01-03 DIAGNOSIS — R0602 Shortness of breath: Secondary | ICD-10-CM | POA: Diagnosis not present

## 2021-01-03 DIAGNOSIS — S93691A Other sprain of right foot, initial encounter: Secondary | ICD-10-CM | POA: Diagnosis not present

## 2021-01-04 ENCOUNTER — Encounter (INDEPENDENT_AMBULATORY_CARE_PROVIDER_SITE_OTHER): Payer: Self-pay | Admitting: *Deleted

## 2021-01-18 DIAGNOSIS — R109 Unspecified abdominal pain: Secondary | ICD-10-CM | POA: Diagnosis not present

## 2021-01-19 ENCOUNTER — Other Ambulatory Visit: Payer: Self-pay

## 2021-01-19 ENCOUNTER — Encounter (HOSPITAL_COMMUNITY): Payer: Self-pay | Admitting: Emergency Medicine

## 2021-01-19 ENCOUNTER — Emergency Department (HOSPITAL_COMMUNITY)
Admission: EM | Admit: 2021-01-19 | Discharge: 2021-01-19 | Disposition: A | Discharge status: Home / Self Care | Payer: BC Managed Care – PPO | Source: Home / Self Care | Attending: Emergency Medicine | Admitting: Emergency Medicine

## 2021-01-19 DIAGNOSIS — N3000 Acute cystitis without hematuria: Secondary | ICD-10-CM | POA: Diagnosis not present

## 2021-01-19 DIAGNOSIS — R627 Adult failure to thrive: Secondary | ICD-10-CM | POA: Diagnosis not present

## 2021-01-19 DIAGNOSIS — N3010 Interstitial cystitis (chronic) without hematuria: Secondary | ICD-10-CM | POA: Insufficient documentation

## 2021-01-19 DIAGNOSIS — Z4682 Encounter for fitting and adjustment of non-vascular catheter: Secondary | ICD-10-CM | POA: Diagnosis not present

## 2021-01-19 DIAGNOSIS — R1310 Dysphagia, unspecified: Secondary | ICD-10-CM | POA: Diagnosis not present

## 2021-01-19 DIAGNOSIS — D649 Anemia, unspecified: Secondary | ICD-10-CM | POA: Diagnosis not present

## 2021-01-19 DIAGNOSIS — J449 Chronic obstructive pulmonary disease, unspecified: Secondary | ICD-10-CM | POA: Diagnosis not present

## 2021-01-19 DIAGNOSIS — B9620 Unspecified Escherichia coli [E. coli] as the cause of diseases classified elsewhere: Secondary | ICD-10-CM | POA: Diagnosis present

## 2021-01-19 DIAGNOSIS — R109 Unspecified abdominal pain: Secondary | ICD-10-CM | POA: Diagnosis not present

## 2021-01-19 DIAGNOSIS — Z8541 Personal history of malignant neoplasm of cervix uteri: Secondary | ICD-10-CM | POA: Insufficient documentation

## 2021-01-19 DIAGNOSIS — E876 Hypokalemia: Secondary | ICD-10-CM | POA: Diagnosis not present

## 2021-01-19 DIAGNOSIS — J45909 Unspecified asthma, uncomplicated: Secondary | ICD-10-CM | POA: Insufficient documentation

## 2021-01-19 DIAGNOSIS — R1032 Left lower quadrant pain: Secondary | ICD-10-CM | POA: Diagnosis not present

## 2021-01-19 DIAGNOSIS — R0600 Dyspnea, unspecified: Secondary | ICD-10-CM | POA: Diagnosis not present

## 2021-01-19 DIAGNOSIS — D75839 Thrombocytosis, unspecified: Secondary | ICD-10-CM | POA: Diagnosis present

## 2021-01-19 DIAGNOSIS — F5089 Other specified eating disorder: Secondary | ICD-10-CM | POA: Diagnosis not present

## 2021-01-19 DIAGNOSIS — Z7951 Long term (current) use of inhaled steroids: Secondary | ICD-10-CM | POA: Insufficient documentation

## 2021-01-19 DIAGNOSIS — I10 Essential (primary) hypertension: Secondary | ICD-10-CM | POA: Insufficient documentation

## 2021-01-19 DIAGNOSIS — M797 Fibromyalgia: Secondary | ICD-10-CM | POA: Diagnosis not present

## 2021-01-19 DIAGNOSIS — E43 Unspecified severe protein-calorie malnutrition: Secondary | ICD-10-CM | POA: Diagnosis not present

## 2021-01-19 DIAGNOSIS — R569 Unspecified convulsions: Secondary | ICD-10-CM | POA: Diagnosis not present

## 2021-01-19 DIAGNOSIS — F1120 Opioid dependence, uncomplicated: Secondary | ICD-10-CM | POA: Diagnosis not present

## 2021-01-19 DIAGNOSIS — J42 Unspecified chronic bronchitis: Secondary | ICD-10-CM | POA: Diagnosis not present

## 2021-01-19 DIAGNOSIS — R1313 Dysphagia, pharyngeal phase: Secondary | ICD-10-CM | POA: Diagnosis not present

## 2021-01-19 DIAGNOSIS — B3781 Candidal esophagitis: Secondary | ICD-10-CM | POA: Diagnosis not present

## 2021-01-19 DIAGNOSIS — R1312 Dysphagia, oropharyngeal phase: Secondary | ICD-10-CM | POA: Diagnosis not present

## 2021-01-19 DIAGNOSIS — Q453 Other congenital malformations of pancreas and pancreatic duct: Secondary | ICD-10-CM | POA: Diagnosis not present

## 2021-01-19 DIAGNOSIS — N3090 Cystitis, unspecified without hematuria: Secondary | ICD-10-CM | POA: Diagnosis not present

## 2021-01-19 DIAGNOSIS — R079 Chest pain, unspecified: Secondary | ICD-10-CM | POA: Diagnosis not present

## 2021-01-19 DIAGNOSIS — E785 Hyperlipidemia, unspecified: Secondary | ICD-10-CM | POA: Diagnosis present

## 2021-01-19 DIAGNOSIS — Z79899 Other long term (current) drug therapy: Secondary | ICD-10-CM | POA: Insufficient documentation

## 2021-01-19 DIAGNOSIS — R112 Nausea with vomiting, unspecified: Secondary | ICD-10-CM | POA: Diagnosis not present

## 2021-01-19 DIAGNOSIS — N390 Urinary tract infection, site not specified: Secondary | ICD-10-CM | POA: Diagnosis not present

## 2021-01-19 DIAGNOSIS — K861 Other chronic pancreatitis: Secondary | ICD-10-CM | POA: Diagnosis not present

## 2021-01-19 DIAGNOSIS — E162 Hypoglycemia, unspecified: Secondary | ICD-10-CM | POA: Diagnosis present

## 2021-01-19 DIAGNOSIS — F319 Bipolar disorder, unspecified: Secondary | ICD-10-CM | POA: Diagnosis not present

## 2021-01-19 DIAGNOSIS — Z8719 Personal history of other diseases of the digestive system: Secondary | ICD-10-CM | POA: Diagnosis not present

## 2021-01-19 DIAGNOSIS — F17210 Nicotine dependence, cigarettes, uncomplicated: Secondary | ICD-10-CM | POA: Insufficient documentation

## 2021-01-19 DIAGNOSIS — Z681 Body mass index (BMI) 19 or less, adult: Secondary | ICD-10-CM | POA: Diagnosis not present

## 2021-01-19 DIAGNOSIS — R748 Abnormal levels of other serum enzymes: Secondary | ICD-10-CM | POA: Diagnosis not present

## 2021-01-19 DIAGNOSIS — K859 Acute pancreatitis without necrosis or infection, unspecified: Secondary | ICD-10-CM | POA: Diagnosis not present

## 2021-01-19 DIAGNOSIS — B370 Candidal stomatitis: Secondary | ICD-10-CM | POA: Diagnosis not present

## 2021-01-19 DIAGNOSIS — Z4659 Encounter for fitting and adjustment of other gastrointestinal appliance and device: Secondary | ICD-10-CM | POA: Diagnosis not present

## 2021-01-19 DIAGNOSIS — F17200 Nicotine dependence, unspecified, uncomplicated: Secondary | ICD-10-CM | POA: Diagnosis not present

## 2021-01-19 DIAGNOSIS — M4856XA Collapsed vertebra, not elsewhere classified, lumbar region, initial encounter for fracture: Secondary | ICD-10-CM | POA: Diagnosis not present

## 2021-01-19 DIAGNOSIS — R1314 Dysphagia, pharyngoesophageal phase: Secondary | ICD-10-CM | POA: Diagnosis not present

## 2021-01-19 DIAGNOSIS — Z20822 Contact with and (suspected) exposure to covid-19: Secondary | ICD-10-CM | POA: Diagnosis not present

## 2021-01-19 DIAGNOSIS — R1030 Lower abdominal pain, unspecified: Secondary | ICD-10-CM

## 2021-01-19 DIAGNOSIS — E46 Unspecified protein-calorie malnutrition: Secondary | ICD-10-CM | POA: Diagnosis not present

## 2021-01-19 LAB — CBC
HCT: 34.4 % — ABNORMAL LOW (ref 36.0–46.0)
Hemoglobin: 11.6 g/dL — ABNORMAL LOW (ref 12.0–15.0)
MCH: 32.4 pg (ref 26.0–34.0)
MCHC: 33.7 g/dL (ref 30.0–36.0)
MCV: 96.1 fL (ref 80.0–100.0)
Platelets: 352 10*3/uL (ref 150–400)
RBC: 3.58 MIL/uL — ABNORMAL LOW (ref 3.87–5.11)
RDW: 12.8 % (ref 11.5–15.5)
WBC: 7.8 10*3/uL (ref 4.0–10.5)
nRBC: 0 % (ref 0.0–0.2)

## 2021-01-19 LAB — COMPREHENSIVE METABOLIC PANEL
ALT: 40 U/L (ref 0–44)
AST: 22 U/L (ref 15–41)
Albumin: 4 g/dL (ref 3.5–5.0)
Alkaline Phosphatase: 69 U/L (ref 38–126)
Anion gap: 7 (ref 5–15)
BUN: 8 mg/dL (ref 6–20)
CO2: 24 mmol/L (ref 22–32)
Calcium: 9.2 mg/dL (ref 8.9–10.3)
Chloride: 104 mmol/L (ref 98–111)
Creatinine, Ser: 0.73 mg/dL (ref 0.44–1.00)
GFR, Estimated: >60 mL/min (ref >60)
Glucose, Bld: 109 mg/dL — ABNORMAL HIGH (ref 70–99)
Potassium: 3.1 mmol/L — ABNORMAL LOW (ref 3.5–5.1)
Sodium: 135 mmol/L (ref 135–145)
Total Bilirubin: 0.5 mg/dL (ref 0.3–1.2)
Total Protein: 7 g/dL (ref 6.5–8.1)

## 2021-01-19 LAB — URINALYSIS, ROUTINE W REFLEX MICROSCOPIC
Bacteria, UA: NONE SEEN
Bilirubin Urine: NEGATIVE
Glucose, UA: NEGATIVE mg/dL
Hgb urine dipstick: NEGATIVE
Ketones, ur: NEGATIVE mg/dL
Leukocytes,Ua: NEGATIVE
Nitrite: POSITIVE — AB
Protein, ur: NEGATIVE mg/dL
Specific Gravity, Urine: 1.006 (ref 1.005–1.030)
pH: 6 (ref 5.0–8.0)

## 2021-01-19 LAB — POC URINE PREG, ED: Preg Test, Ur: NEGATIVE

## 2021-01-19 LAB — LIPASE, BLOOD: Lipase: 25 U/L (ref 11–51)

## 2021-01-19 MED ORDER — ONDANSETRON 8 MG PO TBDP: 8.0000 mg | ORAL_TABLET | Freq: Three times a day (TID) | ORAL | 0 refills | Status: DC | PRN

## 2021-01-19 MED ORDER — LACTATED RINGERS IV BOLUS
1000.0000 mL | Freq: Once | INTRAVENOUS | Status: AC
  Administered 2021-01-19: 1000 mL via INTRAVENOUS

## 2021-01-19 MED ORDER — HYDROMORPHONE HCL 1 MG/ML IJ SOLN
0.5000 mg | Freq: Once | INTRAMUSCULAR | Status: AC
  Administered 2021-01-19: 0.5 mg via INTRAVENOUS
  Filled 2021-01-19: qty 1

## 2021-01-19 MED ORDER — POTASSIUM CHLORIDE CRYS ER 20 MEQ PO TBCR: 20.0000 meq | EXTENDED_RELEASE_TABLET | Freq: Two times a day (BID) | ORAL | 0 refills | Status: DC

## 2021-01-19 MED ORDER — SODIUM CHLORIDE 0.9 % IV SOLN
25.0000 mg | Freq: Four times a day (QID) | INTRAVENOUS | Status: DC | PRN
  Administered 2021-01-19: 25 mg via INTRAVENOUS
  Filled 2021-01-19: qty 1

## 2021-01-19 MED ORDER — PROMETHAZINE HCL 25 MG/ML IJ SOLN
INTRAMUSCULAR | Status: AC
  Filled 2021-01-19: qty 1

## 2021-01-19 MED ORDER — HYDROMORPHONE HCL 1 MG/ML IJ SOLN
0.5000 mg | Freq: Once | INTRAMUSCULAR | Status: AC
Start: 2021-01-19 — End: 2021-01-19
  Administered 2021-01-19: 0.5 mg via INTRAVENOUS
  Filled 2021-01-19: qty 1

## 2021-01-19 MED ORDER — PROMETHAZINE HCL 25 MG RE SUPP: 25.0000 mg | Freq: Four times a day (QID) | RECTAL | 0 refills | Status: DC | PRN

## 2021-01-19 NOTE — ED Notes (Signed)
Pt walked to restroom 

## 2021-01-19 NOTE — ED Provider Notes (Signed)
[facility] EMERGENCY DEPARTMENT Provider Note   CSN: [id number] Arrival date & time: 01/19/21  0145     History Chief Complaint  Patient presents with   Abdominal Pain    [name] is a 45 y.o. female.  The history is provided by the patient.  Abdominal Pain She has history of hypertension, hyperlipidemia, bipolar disorder, interstitial cystitis, COPD and comes in with worsening lower abdominal pain and lower back pain typical of interstitial cystitis flares.  She states that she normally takes hydrocodone-acetaminophen for pain, but has been vomiting for the last week and not able to hold anything down.  She normally uses ondansetron oral dissolving tablet and promethazine tablet for nausea, but they have not been effective.  She denies fever or chills.  She currently rates pain at 8/10.  She was supposed to have surgical procedure for her interstitial cystitis, but it had to be delayed because of need for insurance authorization.  She denies fever, chills, sweats.  There has not been any diarrhea.   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 Melvyn Novas.....-HFA 75% 12/04/2008>75% 02/05/2009>75% 08/04/2009 -PFT's 01/04/2009 2.56 (86%) ratio 75, no resp to B2 and DLC0 67% > 80 after correction    Cancer (HCC)    cervical cancer   Chronic interstitial cystitis 03/11/2009   Chronic nausea    Chronic pain    Colon polyps    COLONIC POLYPS, HX OF 07/25/2006   ADENOMATOUS POLYP   COPD (chronic obstructive pulmonary disease) ([place])    DEPRESSION 08/31/2007   Endometriosis    FIBROMYALGIA 08/31/2007   Fibromyalgia     GERD 02/05/2009   Hyperlipidemia    HYPERTENSION 08/31/2007   IBS 03/11/2009   Internal hemorrhoids    Migraine headache    NEPHROLITHIASIS 08/31/2007   Pancreatitis    PONV (postoperative nausea and vomiting)    RECTAL BLEEDING 03/11/2009   Sciatica    right leg   Seizures ([place])    been about 1 year since last seisure per pt   SLEEP APNEA 08/31/2007   Substance abuse ([place])    Thyroid disease    Uterine cyst     Patient Active Problem List   Diagnosis Date Noted   Nausea without vomiting 05/24/2020   Abdominal pain 05/24/2020   Severe protein-calorie malnutrition ([place]) 05/24/2020   Encounter by telehealth for suspected COVID-19 03/20/2019   Abdominal pain, epigastric 10/07/2018   Intractable vomiting 08/09/2018   Compression fracture of fifth lumbar vertebra ([place]) 02/07/2018   Altered mental status 02/07/2018   Generalized weakness 01/24/2018   MDD (major depressive disorder) 04/23/2017   Substance abuse ([place]) 04/19/2017   Hyperthyroidism 02/17/2015   Anxiety 01/28/2015   Clinical depression 01/28/2015   COPD with acute exacerbation ([place]) 11/26/2013   Vaginitis and vulvovaginitis 11/26/2013   Recurrent pneumonia 02/02/2013   Left arm pain 01/13/2013   Left arm pain 01/13/2013    Class: Acute   Dizziness and giddiness 01/13/2013   Acute bronchitis 12/07/2012   Rib pain 12/07/2012   Insomnia 08/26/2012   Diarrhea 06/19/2012  Heme positive stool 06/19/2012   Bloating 06/19/2012   Nausea alone 06/19/2012   Bladder retention 04/24/2012   Tremor 03/05/2012   Dysuria 03/05/2012   Failure to thrive in adult 06/21/2011   Airway hyperreactivity 03/08/2011   Back pain, chronic 03/08/2011   Chronic obstructive pulmonary disease ([place]) 03/08/2011   Acid reflux 03/08/2011   Cystitis 01/22/2011   Bilateral hand numbness 01/13/2011   WEIGHT LOSS 06/24/2010   RIB PAIN, LEFT SIDED 05/04/2010   ABDOMINAL PAIN, LEFT UPPER QUADRANT 05/04/2010   Unspecified otitis media 10/26/2009    BRONCHITIS, RECURRENT 08/23/2009   URI, ACUTE 08/04/2009   FATIGUE 07/06/2009   IBS 03/11/2009   ANAL FISSURE 03/11/2009   RECTAL BLEEDING 03/11/2009   Chronic interstitial cystitis 03/11/2009   COLONIC POLYPS, HX OF 03/11/2009   GERD 02/05/2009   SMOKER 12/04/2008   chronic asthma poorly controlled 12/04/2008   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   BENZODIAZEPINE ADDICTION 08/31/2007   Bipolar disorder ([place]) 08/31/2007   HYPERTENSION 08/31/2007   EMPHYSEMA 08/31/2007   NEPHROLITHIASIS 08/31/2007   ARTHRITIS 08/31/2007   FIBROMYALGIA 08/31/2007   SLEEP APNEA 08/31/2007    Past Surgical History:  Procedure Laterality Date   ABDOMINAL HYSTERECTOMY     BIOPSY  10/25/2018   Procedure: BIOPSY;  Surgeon: Rogene Houston, MD;  Location: [facility];  Service: Endoscopy;;  gastric   BIOPSY  06/11/2020   Procedure: BIOPSY;  Surgeon: Montez Morita, Quillian Quince, MD;  Location: [facility];  Service: Gastroenterology;;  small bowel   bladder stretching x6     BLADDER SURGERY     stimulator placed and stretching    CHOLECYSTECTOMY     COLONOSCOPY     COLONOSCOPY WITH PROPOFOL N/A 09/03/2014   Procedure: COLONOSCOPY WITH PROPOFOL;  Surgeon: Milus Banister, MD;  Location: [facility];  Service: Endoscopy;  Laterality: N/A;   ESOPHAGOGASTRODUODENOSCOPY (EGD) WITH PROPOFOL N/A 10/25/2018   Procedure: ESOPHAGOGASTRODUODENOSCOPY (EGD) WITH PROPOFOL;  Surgeon: Rogene Houston, MD;  Location: [facility];  Service: Endoscopy;  Laterality: N/A;  11:15   ESOPHAGOGASTRODUODENOSCOPY (EGD) WITH PROPOFOL N/A 06/11/2020   Procedure: ESOPHAGOGASTRODUODENOSCOPY (EGD) WITH PROPOFOL;  Surgeon: Harvel Quale, MD;  Location: [facility];  Service: Gastroenterology;  Laterality: N/A;   interstitial cystitis     PACEMAKER INSERTION     in hip for interstitial cystitis   pacemaker removal     removal of uterine cyst and scrapped uterus     replaced bladder pacemaker       OB History      Gravida  3   Para  2   Term      Preterm      AB  1   Living  2      SAB  1   IAB  0   Ectopic  0   Multiple  0   Live Births  2           Family History  Problem Relation Age of Onset   Heart disease Father    Asthma Maternal Grandmother    Emphysema Maternal Grandfather    Cancer Maternal Grandfather        Lung Cancer   Cancer Other        Lung Cancer-Aunt   Colon cancer Neg Hx    Esophageal cancer Neg Hx    Rectal cancer Neg Hx    Stomach cancer Neg Hx    Thyroid disease Neg Hx     Social  History   Tobacco Use   Smoking status: Every Day    Packs/day: 0.50    Years: 14.00    Pack years: 7.00    Types: Cigarettes   Smokeless tobacco: Never  Vaping Use   Vaping Use: Some days  Substance Use Topics   Alcohol use: Not Currently    Comment: last drink 3 weeks ago; recently released from rehab   Drug use: Not Currently    Types: Marijuana    Comment: once a month    Home Medications Prior to Admission medications   Medication Sig Start Date End Date Taking? Authorizing Provider  acetaminophen (TYLENOL) 500 MG tablet Take 1,000 mg by mouth every 6 (six) hours as needed.    [provider]  albuterol (PROAIR HFA) 108 (90 Base) MCG/ACT inhaler INHALE 2 PUFFS EVERY 6 HOURS AS NEEDED FOR SHORTNESS OF BREATH AND WHEEZING. Patient taking differently: Inhale 2 puffs into the lungs every 6 (six) hours as needed for shortness of breath or wheezing. 02/27/20   [name], DO  clonazePAM (KLONOPIN) 0.5 MG tablet Take 1 tablet (0.5 mg total) by mouth 3 (three) times daily as needed for anxiety. 05/01/19   [name], DO  cyclobenzaprine (FLEXERIL) 5 MG tablet Take 1 tablet (5 mg total) by mouth 3 (three) times daily as needed for muscle spasms. 05/21/20   Mordecai Rasmussen, MD  estradiol (ESTRACE) 0.1 MG/GM vaginal cream Place 1 Applicatorful vaginally every 3 (three) days. 11/21/19   [provider]  estradiol (ESTRACE)  0.5 MG tablet Take 0.5 mg by mouth daily.    [provider]  fluticasone (FLONASE) 50 MCG/ACT nasal spray Place 2 sprays into both nostrils daily. 04/22/19   [name], DO  gabapentin (NEURONTIN) 800 MG tablet Take 800 mg by mouth 3 (three) times daily as needed for pain. 05/19/20   [provider]  HYDROcodone-acetaminophen (NORCO/VICODIN) 5-325 MG tablet Take 1 tablet by mouth 2 (two) times daily as needed for pain. 05/25/20   [provider]  hydrOXYzine (ATARAX/VISTARIL) 50 MG tablet Take 50 mg by mouth at bedtime. 08/26/18   [provider]  Hyoscyamine Sulfate SL 0.125 MG SUBL Take 0.125 mg by mouth every 4 (four) hours as needed (for cramping). 11/04/19   [provider]  lamoTRIgine (LAMICTAL) 100 MG tablet Take 100 mg by mouth 2 (two) times daily. 02/16/20   [provider]  mirtazapine (REMERON) 45 MG tablet Take 22.5 mg by mouth at bedtime.  12/13/18   [provider]  omeprazole (PRILOSEC) 40 MG capsule TAKE 1 CAPSULE BY MOUTH ONCE DAILY. 06/22/20   Carollee Herter, Kendrick Fries R, DO  OVER THE COUNTER MEDICATION Estradiol one po QHS.    [provider]  oxybutynin (DITROPAN XL) 15 MG 24 hr tablet Take 15 mg by mouth daily. 09/16/19   [provider]  polyethylene glycol-electrolytes (TRILYTE) 420 g solution Take 4,000 mLs by mouth as directed. 05/24/20   Harvel Quale, MD  potassium chloride SA (KLOR-CON) 20 MEQ tablet Take 1 tablet (20 mEq total) by mouth 2 (two) times daily. 03/12/20   Triplett, Tammy, PA-C  prochlorperazine (COMPAZINE) 10 MG tablet Take 1 tablet (10 mg total) by mouth as needed (headache). 03/20/20   Caccavale, Sophia, PA-C  promethazine (PHENERGAN) 25 MG tablet TAKE (1) TABLET EVERY SIX HOURS AS NEEDED FOR NAUSEA AND VOMITING. 11/08/20   Carollee Herter, Alferd Apa, DO  QUEtiapine (SEROQUEL) 400 MG tablet Take  400 mg by mouth at bedtime.  09/16/18   [provider]  RESTASIS 0.05 %  ophthalmic emulsion Place 1 drop into both eyes 2 (two) times daily. 02/26/20   [provider]  SYMBICORT 80-4.5 MCG/ACT inhaler INHALE 2 PUFFS INTO THE LUNGS TWICE DAILY. 12/28/20   Saguier, Percell Miller, PA-C    Allergies    Abilify [aripiprazole], Amitriptyline, Metoclopramide hcl, Propoxyphene, Toradol [ketorolac tromethamine], Tramadol, Ambien [zolpidem tartrate], Eszopiclone, Varenicline, Buprenorphine hcl, Demerol [meperidine], Emetrol, and Morphine and related  Review of Systems   Review of Systems  Gastrointestinal:  Positive for abdominal pain.  All other systems reviewed and are negative.  Physical Exam Updated Vital Signs BP 93/65   Pulse 66   Temp 98.1 F (36.7 C)   Resp 15   Ht 5\' 4"  (4.287 m)   Wt 40.4 kg   SpO2 95%   BMI 15.28 kg/m   Physical Exam Vitals and nursing note reviewed.  45 year old female, resting comfortably and in no acute distress. Vital signs are normal. Oxygen saturation is 94%, which is normal. Head is normocephalic and atraumatic. PERRLA, EOMI. Oropharynx is clear. Neck is nontender and supple without adenopathy or JVD. Back is nontender and there is no CVA tenderness. Lungs are clear without rales, wheezes, or rhonchi. Chest is nontender. Heart has regular rate and rhythm without murmur. Abdomen is soft, flat, with moderate tenderness diffusely across the lower abdomen without point tenderness.  There is no rebound or guarding.  There are no masses or hepatosplenomegaly and peristalsis is hypoactive. Extremities have no cyanosis or edema, full range of motion is present. Skin is warm and dry without rash. Neurologic: Mental status is normal, cranial nerves are intact, there are no motor or sensory deficits.  ED Results / Procedures / Treatments   Labs (all labs ordered are listed, but only abnormal results are displayed) Labs Reviewed  COMPREHENSIVE METABOLIC PANEL - Abnormal; Notable for the following components:      Result Value    Potassium 3.1 (*)    Glucose, Bld 109 (*)    All other components within normal limits  CBC - Abnormal; Notable for the following components:   RBC 3.58 (*)    Hemoglobin 11.6 (*)    HCT 34.4 (*)    All other components within normal limits  URINALYSIS, ROUTINE W REFLEX MICROSCOPIC - Abnormal; Notable for the following components:   Nitrite POSITIVE (*)    All other components within normal limits  LIPASE, BLOOD  POC URINE PREG, ED   Procedures Procedures   Medications Ordered in ED Medications  promethazine (PHENERGAN) 25 mg in sodium chloride 0.9 % 50 mL IVPB (0 mg Intravenous Stopped 01/19/21 0607)  lactated ringers bolus 1,000 mL (1,000 mLs Intravenous New Bag/Given 01/19/21 0537)  HYDROmorphone (DILAUDID) injection 0.5 mg (0.5 mg Intravenous Given 01/19/21 0541)  HYDROmorphone (DILAUDID) injection 0.5 mg (0.5 mg Intravenous Given 01/19/21 6811)    ED Course  I have reviewed the triage vital signs and the nursing notes.  Pertinent labs & imaging results that were available during my care of the patient were reviewed by me and considered in my medical decision making (see chart for details).    MDM Rules/Calculators/A&P                         Lower abdominal pain and patient with known history of interstitial cystitis.  Pattern of pain and physical exam are consistent with an exacerbation  of interstitial cystitis.  Labs are significant for mild anemia which is not significantly changed from baseline, and hypokalemia which is felt to be secondary to emesis.  Old records are reviewed confirming patient is to be scheduled for surgery for interstitial cystitis.  She will be given IV fluids, hydromorphone, promethazine.  Following above-noted treatment, nausea was markedly improved, pain modestly improved.  She will be given a second dose of hydromorphone and discharged.  She is given prescription for promethazine suppository and a higher dose of ondansetron oral dissolving tablet.   She is to follow-up with her urologist for definitive management of her interstitial cystitis.  Final Clinical Impression(s) / ED Diagnoses Final diagnoses:  Lower abdominal pain  Interstitial cystitis  Hypokalemia  Normochromic normocytic anemia    Rx / DC Orders ED Discharge Orders          Ordered    potassium chloride SA (KLOR-CON) 20 MEQ tablet  2 times daily        01/19/21 0720    promethazine (PHENERGAN) 25 MG suppository  Every 6 hours PRN        01/19/21 0720    ondansetron (ZOFRAN-ODT) 8 MG disintegrating tablet  Every 8 hours PRN        01/19/21 5797             Delora Fuel, MD 28/20/60 [phone]

## 2021-01-19 NOTE — ED Provider Notes (Signed)
Signout from Dr. Roxanne Mins.  45 year old female here with abdominal pain.  Work-up has been fairly unremarkable.  Getting IV fluids.  Plan is to discharge after IV fluids complete. Physical Exam  BP 103/71   Pulse 65   Temp 98.3 F (36.8 C) (Oral)   Resp 17   Ht 5\' 4"  (1.626 m)   Wt 40.4 kg   SpO2 93%   BMI 15.28 kg/m   Physical Exam  ED Course/Procedures     Procedures  MDM  Patient received her IV fluids.  No other complaints.  Will discharge per plan from Dr. Demaris Callander, Rebeca Alert, MD 01/19/21 [phone]

## 2021-01-19 NOTE — Discharge Instructions (Signed)
You have been given prescriptions for promethazine (Phenergan) suppository.  You may use this if you are actively vomiting and unable to hold a promethazine tablet down.  You have also been given a prescription for a higher dose of ondansetron oral disintegrating tablet.  Hopefully this will help control your nausea better.  Follow-up with your urologist for management of your interstitial cystitis.  Return if symptoms are worsening.

## 2021-01-19 NOTE — ED Triage Notes (Signed)
Pt chronic abd/back pain and headache x one month.

## 2021-01-20 ENCOUNTER — Inpatient Hospital Stay (HOSPITAL_BASED_OUTPATIENT_CLINIC_OR_DEPARTMENT_OTHER)
Admission: EM | Admit: 2021-01-20 | Discharge: 2021-02-01 | DRG: 689 | Disposition: A | Discharge status: Home Health | Payer: BC Managed Care – PPO | Attending: Internal Medicine | Admitting: Internal Medicine

## 2021-01-20 ENCOUNTER — Encounter (HOSPITAL_BASED_OUTPATIENT_CLINIC_OR_DEPARTMENT_OTHER): Payer: Self-pay | Admitting: *Deleted

## 2021-01-20 ENCOUNTER — Other Ambulatory Visit: Payer: Self-pay

## 2021-01-20 ENCOUNTER — Emergency Department (HOSPITAL_BASED_OUTPATIENT_CLINIC_OR_DEPARTMENT_OTHER): Payer: BC Managed Care – PPO

## 2021-01-20 DIAGNOSIS — Z978 Presence of other specified devices: Secondary | ICD-10-CM

## 2021-01-20 DIAGNOSIS — M5431 Sciatica, right side: Secondary | ICD-10-CM | POA: Diagnosis present

## 2021-01-20 DIAGNOSIS — F17210 Nicotine dependence, cigarettes, uncomplicated: Secondary | ICD-10-CM | POA: Diagnosis present

## 2021-01-20 DIAGNOSIS — E162 Hypoglycemia, unspecified: Secondary | ICD-10-CM | POA: Diagnosis present

## 2021-01-20 DIAGNOSIS — J42 Unspecified chronic bronchitis: Secondary | ICD-10-CM | POA: Diagnosis present

## 2021-01-20 DIAGNOSIS — D75839 Thrombocytosis, unspecified: Secondary | ICD-10-CM | POA: Diagnosis present

## 2021-01-20 DIAGNOSIS — E785 Hyperlipidemia, unspecified: Secondary | ICD-10-CM | POA: Diagnosis present

## 2021-01-20 DIAGNOSIS — E43 Unspecified severe protein-calorie malnutrition: Secondary | ICD-10-CM | POA: Diagnosis present

## 2021-01-20 DIAGNOSIS — J449 Chronic obstructive pulmonary disease, unspecified: Secondary | ICD-10-CM | POA: Diagnosis present

## 2021-01-20 DIAGNOSIS — R000 Tachycardia, unspecified: Secondary | ICD-10-CM | POA: Diagnosis present

## 2021-01-20 DIAGNOSIS — N3010 Interstitial cystitis (chronic) without hematuria: Secondary | ICD-10-CM | POA: Diagnosis present

## 2021-01-20 DIAGNOSIS — D649 Anemia, unspecified: Secondary | ICD-10-CM | POA: Diagnosis present

## 2021-01-20 DIAGNOSIS — B370 Candidal stomatitis: Secondary | ICD-10-CM | POA: Diagnosis present

## 2021-01-20 DIAGNOSIS — I952 Hypotension due to drugs: Secondary | ICD-10-CM | POA: Diagnosis present

## 2021-01-20 DIAGNOSIS — N3000 Acute cystitis without hematuria: Principal | ICD-10-CM | POA: Diagnosis present

## 2021-01-20 DIAGNOSIS — K861 Other chronic pancreatitis: Secondary | ICD-10-CM | POA: Diagnosis present

## 2021-01-20 DIAGNOSIS — Z885 Allergy status to narcotic agent status: Secondary | ICD-10-CM

## 2021-01-20 DIAGNOSIS — R109 Unspecified abdominal pain: Secondary | ICD-10-CM

## 2021-01-20 DIAGNOSIS — Z7951 Long term (current) use of inhaled steroids: Secondary | ICD-10-CM

## 2021-01-20 DIAGNOSIS — K580 Irritable bowel syndrome with diarrhea: Secondary | ICD-10-CM | POA: Diagnosis present

## 2021-01-20 DIAGNOSIS — K859 Acute pancreatitis without necrosis or infection, unspecified: Secondary | ICD-10-CM | POA: Diagnosis present

## 2021-01-20 DIAGNOSIS — N3289 Other specified disorders of bladder: Secondary | ICD-10-CM | POA: Diagnosis present

## 2021-01-20 DIAGNOSIS — Z87442 Personal history of urinary calculi: Secondary | ICD-10-CM

## 2021-01-20 DIAGNOSIS — F17200 Nicotine dependence, unspecified, uncomplicated: Secondary | ICD-10-CM | POA: Diagnosis present

## 2021-01-20 DIAGNOSIS — Z888 Allergy status to other drugs, medicaments and biological substances status: Secondary | ICD-10-CM

## 2021-01-20 DIAGNOSIS — R112 Nausea with vomiting, unspecified: Secondary | ICD-10-CM

## 2021-01-20 DIAGNOSIS — F1120 Opioid dependence, uncomplicated: Secondary | ICD-10-CM | POA: Diagnosis present

## 2021-01-20 DIAGNOSIS — Z20822 Contact with and (suspected) exposure to covid-19: Secondary | ICD-10-CM | POA: Diagnosis present

## 2021-01-20 DIAGNOSIS — Z825 Family history of asthma and other chronic lower respiratory diseases: Secondary | ICD-10-CM

## 2021-01-20 DIAGNOSIS — Z8249 Family history of ischemic heart disease and other diseases of the circulatory system: Secondary | ICD-10-CM

## 2021-01-20 DIAGNOSIS — M797 Fibromyalgia: Secondary | ICD-10-CM | POA: Diagnosis present

## 2021-01-20 DIAGNOSIS — K602 Anal fissure, unspecified: Secondary | ICD-10-CM | POA: Diagnosis present

## 2021-01-20 DIAGNOSIS — R569 Unspecified convulsions: Secondary | ICD-10-CM | POA: Diagnosis present

## 2021-01-20 DIAGNOSIS — K219 Gastro-esophageal reflux disease without esophagitis: Secondary | ICD-10-CM | POA: Diagnosis present

## 2021-01-20 DIAGNOSIS — F319 Bipolar disorder, unspecified: Secondary | ICD-10-CM | POA: Diagnosis present

## 2021-01-20 DIAGNOSIS — B9620 Unspecified Escherichia coli [E. coli] as the cause of diseases classified elsewhere: Secondary | ICD-10-CM | POA: Diagnosis present

## 2021-01-20 DIAGNOSIS — G43909 Migraine, unspecified, not intractable, without status migrainosus: Secondary | ICD-10-CM | POA: Diagnosis present

## 2021-01-20 DIAGNOSIS — E876 Hypokalemia: Secondary | ICD-10-CM | POA: Diagnosis present

## 2021-01-20 DIAGNOSIS — Z681 Body mass index (BMI) 19 or less, adult: Secondary | ICD-10-CM

## 2021-01-20 DIAGNOSIS — R627 Adult failure to thrive: Secondary | ICD-10-CM | POA: Diagnosis present

## 2021-01-20 DIAGNOSIS — M4856XA Collapsed vertebra, not elsewhere classified, lumbar region, initial encounter for fracture: Secondary | ICD-10-CM | POA: Diagnosis present

## 2021-01-20 DIAGNOSIS — H9203 Otalgia, bilateral: Secondary | ICD-10-CM | POA: Diagnosis present

## 2021-01-20 DIAGNOSIS — Z79899 Other long term (current) drug therapy: Secondary | ICD-10-CM

## 2021-01-20 DIAGNOSIS — K648 Other hemorrhoids: Secondary | ICD-10-CM | POA: Diagnosis present

## 2021-01-20 DIAGNOSIS — T40605A Adverse effect of unspecified narcotics, initial encounter: Secondary | ICD-10-CM | POA: Diagnosis present

## 2021-01-20 DIAGNOSIS — E079 Disorder of thyroid, unspecified: Secondary | ICD-10-CM | POA: Diagnosis present

## 2021-01-20 DIAGNOSIS — Z86010 Personal history of colonic polyps: Secondary | ICD-10-CM

## 2021-01-20 DIAGNOSIS — F419 Anxiety disorder, unspecified: Secondary | ICD-10-CM | POA: Diagnosis present

## 2021-01-20 DIAGNOSIS — Z8541 Personal history of malignant neoplasm of cervix uteri: Secondary | ICD-10-CM

## 2021-01-20 DIAGNOSIS — R1115 Cyclical vomiting syndrome unrelated to migraine: Secondary | ICD-10-CM | POA: Diagnosis present

## 2021-01-20 DIAGNOSIS — M542 Cervicalgia: Secondary | ICD-10-CM | POA: Diagnosis not present

## 2021-01-20 DIAGNOSIS — R748 Abnormal levels of other serum enzymes: Secondary | ICD-10-CM | POA: Diagnosis present

## 2021-01-20 DIAGNOSIS — I10 Essential (primary) hypertension: Secondary | ICD-10-CM | POA: Diagnosis present

## 2021-01-20 DIAGNOSIS — R1310 Dysphagia, unspecified: Secondary | ICD-10-CM

## 2021-01-20 DIAGNOSIS — Z801 Family history of malignant neoplasm of trachea, bronchus and lung: Secondary | ICD-10-CM

## 2021-01-20 DIAGNOSIS — R0600 Dyspnea, unspecified: Secondary | ICD-10-CM

## 2021-01-20 DIAGNOSIS — Q453 Other congenital malformations of pancreas and pancreatic duct: Secondary | ICD-10-CM

## 2021-01-20 DIAGNOSIS — Z4659 Encounter for fitting and adjustment of other gastrointestinal appliance and device: Secondary | ICD-10-CM

## 2021-01-20 DIAGNOSIS — B3781 Candidal esophagitis: Secondary | ICD-10-CM | POA: Diagnosis present

## 2021-01-20 DIAGNOSIS — N390 Urinary tract infection, site not specified: Secondary | ICD-10-CM | POA: Diagnosis present

## 2021-01-20 DIAGNOSIS — Z8719 Personal history of other diseases of the digestive system: Secondary | ICD-10-CM

## 2021-01-20 DIAGNOSIS — M1990 Unspecified osteoarthritis, unspecified site: Secondary | ICD-10-CM | POA: Diagnosis present

## 2021-01-20 LAB — CBC WITH DIFFERENTIAL/PLATELET
Abs Immature Granulocytes: 0.02 10*3/uL (ref 0.00–0.07)
Basophils Absolute: 0.1 10*3/uL (ref 0.0–0.1)
Basophils Relative: 1 %
Eosinophils Absolute: 0.1 10*3/uL (ref 0.0–0.5)
Eosinophils Relative: 1 %
HCT: 37.4 % (ref 36.0–46.0)
Hemoglobin: 12.9 g/dL (ref 12.0–15.0)
Immature Granulocytes: 0 %
Lymphocytes Relative: 34 %
Lymphs Abs: 3.2 10*3/uL (ref 0.7–4.0)
MCH: 32.2 pg (ref 26.0–34.0)
MCHC: 34.5 g/dL (ref 30.0–36.0)
MCV: 93.3 fL (ref 80.0–100.0)
Monocytes Absolute: 0.6 10*3/uL (ref 0.1–1.0)
Monocytes Relative: 6 %
Neutro Abs: 5.6 10*3/uL (ref 1.7–7.7)
Neutrophils Relative %: 58 %
Platelets: 439 10*3/uL — ABNORMAL HIGH (ref 150–400)
RBC: 4.01 MIL/uL (ref 3.87–5.11)
RDW: 12.6 % (ref 11.5–15.5)
WBC: 9.6 10*3/uL (ref 4.0–10.5)
nRBC: 0 % (ref 0.0–0.2)

## 2021-01-20 LAB — URINALYSIS, ROUTINE W REFLEX MICROSCOPIC
Bilirubin Urine: NEGATIVE
Glucose, UA: NEGATIVE mg/dL
Hgb urine dipstick: NEGATIVE
Ketones, ur: NEGATIVE mg/dL
Nitrite: POSITIVE — AB
Protein, ur: NEGATIVE mg/dL
Specific Gravity, Urine: 1.025 (ref 1.005–1.030)
pH: 6 (ref 5.0–8.0)

## 2021-01-20 LAB — COMPREHENSIVE METABOLIC PANEL
ALT: 26 U/L (ref 0–44)
AST: 19 U/L (ref 15–41)
Albumin: 4.4 g/dL (ref 3.5–5.0)
Alkaline Phosphatase: 79 U/L (ref 38–126)
Anion gap: 8 (ref 5–15)
BUN: 6 mg/dL (ref 6–20)
CO2: 23 mmol/L (ref 22–32)
Calcium: 9 mg/dL (ref 8.9–10.3)
Chloride: 106 mmol/L (ref 98–111)
Creatinine, Ser: 0.63 mg/dL (ref 0.44–1.00)
GFR, Estimated: >60 mL/min (ref >60)
Glucose, Bld: 107 mg/dL — ABNORMAL HIGH (ref 70–99)
Potassium: 3.1 mmol/L — ABNORMAL LOW (ref 3.5–5.1)
Sodium: 137 mmol/L (ref 135–145)
Total Bilirubin: 0.3 mg/dL (ref 0.3–1.2)
Total Protein: 7.5 g/dL (ref 6.5–8.1)

## 2021-01-20 LAB — URINALYSIS, MICROSCOPIC (REFLEX): RBC / HPF: NONE SEEN RBC/hpf (ref 0–5)

## 2021-01-20 LAB — LIPASE, BLOOD: Lipase: 53 U/L — ABNORMAL HIGH (ref 11–51)

## 2021-01-20 MED ORDER — SODIUM CHLORIDE 0.9 % IV SOLN
25.0000 mg | Freq: Once | INTRAVENOUS | Status: DC
  Filled 2021-01-20: qty 1

## 2021-01-20 MED ORDER — DIPHENHYDRAMINE HCL 50 MG/ML IJ SOLN: 25.0000 mg | Freq: Once | INTRAMUSCULAR | Status: DC

## 2021-01-20 MED ORDER — ONDANSETRON HCL 4 MG/2ML IJ SOLN
4.0000 mg | Freq: Once | INTRAMUSCULAR | Status: AC
  Administered 2021-01-20: 4 mg via INTRAVENOUS
  Filled 2021-01-20: qty 2

## 2021-01-20 MED ORDER — HYDROMORPHONE HCL 1 MG/ML IJ SOLN
0.5000 mg | Freq: Once | INTRAMUSCULAR | Status: AC
  Administered 2021-01-20: 0.5 mg via INTRAVENOUS
  Filled 2021-01-20: qty 1

## 2021-01-20 MED ORDER — ONDANSETRON HCL 4 MG/2ML IJ SOLN
4.0000 mg | Freq: Once | INTRAMUSCULAR | Status: DC
  Administered 2021-01-20: 4 mg via INTRAVENOUS
  Filled 2021-01-20: qty 2

## 2021-01-20 MED ORDER — LACTATED RINGERS IV BOLUS
1000.0000 mL | Freq: Once | INTRAVENOUS | Status: AC
  Administered 2021-01-20: 1000 mL via INTRAVENOUS

## 2021-01-20 MED ORDER — IOHEXOL 300 MG/ML  SOLN
70.0000 mL | Freq: Once | INTRAMUSCULAR | Status: AC | PRN
  Administered 2021-01-20: 70 mL via INTRAVENOUS

## 2021-01-20 NOTE — ED Notes (Addendum)
Pt vomiting again, pt requesting a CT scan, provider made aware

## 2021-01-20 NOTE — ED Triage Notes (Signed)
Seen at [facility] yesterday. States her bladder is not working and she needs a stimulator. She is having surgery in a week. Here today with back/abdominal pain and headache.

## 2021-01-20 NOTE — ED Notes (Addendum)
PA made aware pt wanting additional nausea medication and family is inquiring about CT scan. PA states she will go talk to pt. Family made aware.

## 2021-01-20 NOTE — ED Notes (Signed)
Patient transported to CT 

## 2021-01-20 NOTE — ED Provider Notes (Signed)
[facility] Provider Note   CSN: [id number] Arrival date & time: 01/20/21  1836     History Chief Complaint  Patient presents with   Abdominal Pain   Back Pain   Headache    Normajean ELVERIA LAUDERBAUGH is a 45 y.o. female.  Patient with history of hypertension, hyperlipidemia, bipolar disorder, pancreatitis, interstitial cystitis, COPD and comes in with worsening abdominal pain and lower back pain.  States that this pain is similar in nature to her pancreatitis and interstitial cystitis pain however has been worsening over the past week. She has been unable to tolerate oral intake for the past week due to persistent vomiting and has therefore also been unable to take her home medications. She was seen yesterday at [facility] for same, was given fluid bolus, pain meds, and nausea medication with improvement. Discharged with Phenergan suppository and oral Zofran. Patient attempted suppository use, but was unable to successfully manag her symptoms which prompted her return to the ER today. She has had several bouts of nonbloody emesis every day for the past week. Unable to take oral hydrocodone-acetaminophen which she normally takes for IC pain, and ondansetron oral dissolving tablet and promethazine tablet for nausea have been ineffective. She denies fever or chills.  She currently rates pain at 10/10. Patient has to self cath for urination, scheduled to have botox injection and bladder stimulater implatation at [facility] on 10/21. Patient endorses diarrhea, however states she has a longstanding history of IBS and has had mostly diarrhea for bowel movements over the past year.  The history is provided by the patient. No language interpreter was used.  Abdominal Pain Associated symptoms: diarrhea, dysuria, nausea and vomiting   Associated symptoms: no chest pain, no chills, no cough, no fatigue, no fever and no shortness of breath   Back Pain Associated symptoms: abdominal pain,  dysuria and headaches   Associated symptoms: no chest pain, no fever, no numbness, no pelvic pain and no weakness   Headache Associated symptoms: abdominal pain, back pain, diarrhea, nausea and vomiting   Associated symptoms: no congestion, no cough, no dizziness, no drainage, no fatigue, no fever, no numbness, no seizures and no weakness       Past Medical History:  Diagnosis Date   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 Melvyn Novas.....-HFA 75% 12/04/2008>75% 02/05/2009>75% 08/04/2009 -PFT's 01/04/2009 2.56 (86%) ratio 75, no resp to B2 and DLC0 67% > 80 after correction    Cancer (HCC)    cervical cancer   Chronic interstitial cystitis 03/11/2009   Chronic nausea    Chronic pain    Colon polyps    COLONIC POLYPS, HX OF 07/25/2006   ADENOMATOUS POLYP   COPD (chronic obstructive pulmonary disease) ([place])    DEPRESSION 08/31/2007   Endometriosis    FIBROMYALGIA 08/31/2007   Fibromyalgia    GERD 02/05/2009   Hyperlipidemia    HYPERTENSION 08/31/2007   IBS 03/11/2009   Internal hemorrhoids    Migraine headache    NEPHROLITHIASIS 08/31/2007   Pancreatitis    PONV (postoperative nausea and vomiting)    RECTAL BLEEDING 03/11/2009  Sciatica    right leg   Seizures (HCC)    been about 1 year since last seisure per pt   SLEEP APNEA 08/31/2007   Substance abuse ([place])    Thyroid disease    Uterine cyst     Patient Active Problem List   Diagnosis Date Noted   Nausea without vomiting 05/24/2020   Abdominal pain 05/24/2020   Severe protein-calorie malnutrition ([place]) 05/24/2020   Encounter by telehealth for suspected COVID-19 03/20/2019   Abdominal pain,  epigastric 10/07/2018   Intractable vomiting 08/09/2018   Compression fracture of fifth lumbar vertebra ([place]) 02/07/2018   Altered mental status 02/07/2018   Generalized weakness 01/24/2018   MDD (major depressive disorder) 04/23/2017   Substance abuse ([place]) 04/19/2017   Hyperthyroidism 02/17/2015   Anxiety 01/28/2015   Clinical depression 01/28/2015   COPD with acute exacerbation ([place]) 11/26/2013   Vaginitis and vulvovaginitis 11/26/2013   Recurrent pneumonia 02/02/2013   Left arm pain 01/13/2013   Left arm pain 01/13/2013    Class: Acute   Dizziness and giddiness 01/13/2013   Acute bronchitis 12/07/2012   Rib pain 12/07/2012   Insomnia 08/26/2012   Diarrhea 06/19/2012   Heme positive stool 06/19/2012   Bloating 06/19/2012   Nausea alone 06/19/2012   Bladder retention 04/24/2012   Tremor 03/05/2012   Dysuria 03/05/2012   Failure to thrive in adult 06/21/2011   Airway hyperreactivity 03/08/2011   Back pain, chronic 03/08/2011   Chronic obstructive pulmonary disease ([place]) 03/08/2011   Acid reflux 03/08/2011   Cystitis 01/22/2011   Bilateral hand numbness 01/13/2011   WEIGHT LOSS 06/24/2010   RIB PAIN, LEFT SIDED 05/04/2010   ABDOMINAL PAIN, LEFT UPPER QUADRANT 05/04/2010   Unspecified otitis media 10/26/2009   BRONCHITIS, RECURRENT 08/23/2009   URI, ACUTE 08/04/2009   FATIGUE 07/06/2009   IBS 03/11/2009   ANAL FISSURE 03/11/2009   RECTAL BLEEDING 03/11/2009   Chronic interstitial cystitis 03/11/2009   COLONIC POLYPS, HX OF 03/11/2009   GERD 02/05/2009   SMOKER 12/04/2008   chronic asthma poorly controlled 12/04/2008   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   BENZODIAZEPINE ADDICTION 08/31/2007   Bipolar disorder ([place]) 08/31/2007   HYPERTENSION 08/31/2007   EMPHYSEMA 08/31/2007   NEPHROLITHIASIS 08/31/2007   ARTHRITIS 08/31/2007   FIBROMYALGIA 08/31/2007   SLEEP APNEA 08/31/2007    Past Surgical History:  Procedure Laterality Date   ABDOMINAL HYSTERECTOMY      BIOPSY  10/25/2018   Procedure: BIOPSY;  Surgeon: Rogene Houston, MD;  Location: [facility];  Service: Endoscopy;;  gastric   BIOPSY  06/11/2020   Procedure: BIOPSY;  Surgeon: Montez Morita, Quillian Quince, MD;  Location: [facility];  Service: Gastroenterology;;  small bowel   bladder stretching x6     BLADDER SURGERY     stimulator placed and stretching    CHOLECYSTECTOMY     COLONOSCOPY     COLONOSCOPY WITH PROPOFOL N/A 09/03/2014   Procedure: COLONOSCOPY WITH PROPOFOL;  Surgeon: Milus Banister, MD;  Location: [facility];  Service: Endoscopy;  Laterality: N/A;   ESOPHAGOGASTRODUODENOSCOPY (EGD) WITH PROPOFOL N/A 10/25/2018   Procedure: ESOPHAGOGASTRODUODENOSCOPY (EGD) WITH PROPOFOL;  Surgeon: Rogene Houston, MD;  Location: [facility];  Service: Endoscopy;  Laterality: N/A;  11:15   ESOPHAGOGASTRODUODENOSCOPY (EGD) WITH PROPOFOL N/A 06/11/2020   Procedure: ESOPHAGOGASTRODUODENOSCOPY (EGD) WITH PROPOFOL;  Surgeon: Harvel Quale, MD;  Location: [facility];  Service: Gastroenterology;  Laterality: N/A;   interstitial cystitis     PACEMAKER INSERTION  in hip for interstitial cystitis   pacemaker removal     removal of uterine cyst and scrapped uterus     replaced bladder pacemaker       OB History     Gravida  3   Para  2   Term      Preterm      AB  1   Living  2      SAB  1   IAB  0   Ectopic  0   Multiple  0   Live Births  2           Family History  Problem Relation Age of Onset   Heart disease Father    Asthma Maternal Grandmother    Emphysema Maternal Grandfather    Cancer Maternal Grandfather        Lung Cancer   Cancer Other        Lung Cancer-Aunt   Colon cancer Neg Hx    Esophageal cancer Neg Hx    Rectal cancer Neg Hx    Stomach cancer Neg Hx    Thyroid disease Neg Hx     Social History   Tobacco Use   Smoking status: Every Day    Packs/day: 0.50    Years: 14.00    Pack years: 7.00    Types: Cigarettes    Smokeless tobacco: Never  Vaping Use   Vaping Use: Some days  Substance Use Topics   Alcohol use: Not Currently    Comment: last drink 3 weeks ago; recently released from rehab   Drug use: Not Currently    Types: Marijuana    Comment: once a month    Home Medications Prior to Admission medications   Medication Sig Start Date End Date Taking? Authorizing Provider  acetaminophen (TYLENOL) 500 MG tablet Take 1,000 mg by mouth every 6 (six) hours as needed.    [provider]  albuterol (PROAIR HFA) 108 (90 Base) MCG/ACT inhaler INHALE 2 PUFFS EVERY 6 HOURS AS NEEDED FOR SHORTNESS OF BREATH AND WHEEZING. Patient taking differently: Inhale 2 puffs into the lungs every 6 (six) hours as needed for shortness of breath or wheezing. 02/27/20   [name], DO  clonazePAM (KLONOPIN) 0.5 MG tablet Take 1 tablet (0.5 mg total) by mouth 3 (three) times daily as needed for anxiety. 05/01/19   [name], DO  cyclobenzaprine (FLEXERIL) 5 MG tablet Take 1 tablet (5 mg total) by mouth 3 (three) times daily as needed for muscle spasms. 05/21/20   Mordecai Rasmussen, MD  estradiol (ESTRACE) 0.1 MG/GM vaginal cream Place 1 Applicatorful vaginally every 3 (three) days. 11/21/19   [provider]  estradiol (ESTRACE) 0.5 MG tablet Take 0.5 mg by mouth daily.    [provider]  fluticasone (FLONASE) 50 MCG/ACT nasal spray Place 2 sprays into both nostrils daily. 04/22/19   [name], DO  gabapentin (NEURONTIN) 800 MG tablet Take 800 mg by mouth 3 (three) times daily as needed for pain. 05/19/20   [provider]  HYDROcodone-acetaminophen (NORCO/VICODIN) 5-325 MG tablet Take 1 tablet by mouth 2 (two) times daily as needed for pain. 05/25/20   [provider]  hydrOXYzine (ATARAX/VISTARIL) 50 MG tablet Take 50 mg by mouth at bedtime. 08/26/18   [provider]  Hyoscyamine Sulfate SL 0.125 MG SUBL Take 0.125 mg by mouth every 4 (four) hours  as needed (for cramping). 11/04/19   [provider]  lamoTRIgine (LAMICTAL) 100 MG tablet Take 100 mg by mouth 2 (two) times daily. 02/16/20   [provider]  mirtazapine (REMERON) 45 MG tablet Take 22.5 mg by mouth at bedtime.  12/13/18   [provider]  omeprazole (PRILOSEC) 40 MG capsule TAKE 1 CAPSULE BY MOUTH ONCE DAILY. 06/22/20   Roma Schanz R, DO  ondansetron (ZOFRAN-ODT) 8 MG disintegrating tablet Take 1 tablet (8 mg total) by mouth every 8 (eight) hours as needed for nausea or vomiting. 40/98/11   Delora Fuel, MD  OVER THE COUNTER MEDICATION Estradiol one po QHS.    [provider]  oxybutynin (DITROPAN XL) 15 MG 24 hr tablet Take 15 mg by mouth daily. 09/16/19   [provider]  polyethylene glycol-electrolytes (TRILYTE) 420 g solution Take 4,000 mLs by mouth as directed. 05/24/20   Harvel Quale, MD  potassium chloride SA (KLOR-CON) 20 MEQ tablet Take 1 tablet (20 mEq total) by mouth 2 (two) times daily. 91/47/82   Delora Fuel, MD  prochlorperazine (COMPAZINE) 10 MG tablet Take 1 tablet (10 mg total) by mouth as needed (headache). 03/20/20   Caccavale, Sophia, PA-C  promethazine (PHENERGAN) 25 MG suppository Place 1 suppository (25 mg total) rectally every 6 (six) hours as needed for nausea or vomiting. 95/62/13   Delora Fuel, MD  promethazine (PHENERGAN) 25 MG tablet TAKE (1) TABLET EVERY SIX HOURS AS NEEDED FOR NAUSEA AND VOMITING. 11/08/20   Carollee Herter, Alferd Apa, DO  QUEtiapine (SEROQUEL) 400 MG tablet Take 400 mg by mouth at bedtime.  09/16/18   [provider]  RESTASIS 0.05 % ophthalmic emulsion Place 1 drop into both eyes 2 (two) times daily. 02/26/20   [provider]  SYMBICORT 80-4.5 MCG/ACT inhaler INHALE 2 PUFFS INTO THE LUNGS TWICE DAILY. 12/28/20   Saguier, Percell Miller, PA-C    Allergies    Abilify [aripiprazole], Amitriptyline, Metoclopramide hcl, Propoxyphene, Toradol [ketorolac tromethamine],  Tramadol, Ambien [zolpidem tartrate], Eszopiclone, Varenicline, Buprenorphine hcl, Demerol [meperidine], Emetrol, and Morphine and related  Review of Systems   Review of Systems  Constitutional:  Negative for chills, fatigue and fever.  HENT:  Negative for congestion, postnasal drip, rhinorrhea and trouble swallowing.   Respiratory:  Negative for cough, chest tightness and shortness of breath.   Cardiovascular:  Negative for chest pain, palpitations and leg swelling.  Gastrointestinal:  Positive for abdominal pain, diarrhea, nausea and vomiting. Negative for abdominal distention and blood in stool.  Genitourinary:  Positive for difficulty urinating and dysuria. Negative for pelvic pain and urgency.  Musculoskeletal:  Positive for back pain.  Skin:  Negative for color change, rash and wound.  Neurological:  Positive for headaches. Negative for dizziness, tremors, seizures, syncope, facial asymmetry, speech difficulty, weakness, light-headedness and numbness.  Psychiatric/Behavioral:  Negative for confusion and decreased concentration.   All other systems reviewed and are negative.  Physical Exam Updated Vital Signs BP 118/84 (BP Location: Left Arm)   Pulse 90   Temp 98 F (36.7 C) (Oral)   Resp 20   Ht 5\' 4"  (1.626 m)   Wt 40.4 kg   SpO2 97%   BMI 15.29 kg/m   Physical Exam Vitals and nursing note reviewed.  Constitutional:      General: She is in acute distress.     Appearance: She is not ill-appearing, toxic-appearing or diaphoretic.  HENT:     Head: Normocephalic and atraumatic.  Eyes:     General: No scleral icterus. Cardiovascular:     Rate and  Rhythm: Normal rate and regular rhythm.     Heart sounds: Normal heart sounds.  Pulmonary:     Effort: Pulmonary effort is normal. No respiratory distress.     Breath sounds: Normal breath sounds. No stridor. No wheezing, rhonchi or rales.  Chest:     Chest wall: No tenderness.  Abdominal:     General: Abdomen is flat. Bowel  sounds are normal.     Palpations: Abdomen is rigid.     Tenderness: There is generalized abdominal tenderness. There is guarding. There is no rebound. Negative signs include Murphy's sign, Rovsing's sign, McBurney's sign, psoas sign and obturator sign.     Comments: Significant tenderness noted throughout exam with minimal palpation  Skin:    General: Skin is warm and dry.  Neurological:     General: No focal deficit present.     Mental Status: She is alert.  Psychiatric:        Mood and Affect: Mood normal.        Behavior: Behavior normal.    ED Results / Procedures / Treatments   Labs (all labs ordered are listed, but only abnormal results are displayed) Labs Reviewed  CBC WITH DIFFERENTIAL/PLATELET - Abnormal; Notable for the following components:      Result Value   Platelets 439 (*)    All other components within normal limits  COMPREHENSIVE METABOLIC PANEL - Abnormal; Notable for the following components:   Potassium 3.1 (*)    Glucose, Bld 107 (*)    All other components within normal limits  LIPASE, BLOOD - Abnormal; Notable for the following components:   Lipase 53 (*)    All other components within normal limits  URINALYSIS, ROUTINE W REFLEX MICROSCOPIC - Abnormal; Notable for the following components:   APPearance CLOUDY (*)    Nitrite POSITIVE (*)    Leukocytes,Ua SMALL (*)    All other components within normal limits  URINALYSIS, MICROSCOPIC (REFLEX) - Abnormal; Notable for the following components:   Bacteria, UA MANY (*)    All other components within normal limits    EKG None  Radiology CT ABDOMEN PELVIS W CONTRAST  Result Date: 01/20/2021 CLINICAL DATA:  Nonlocalized acute abdominal pain. States that her bladder is not working and she needs a stimulator. She is having surgery in a week. Here today with back/abdominal pain and headache. EXAM: CT ABDOMEN AND PELVIS WITH CONTRAST TECHNIQUE: Multidetector CT imaging of the abdomen and pelvis was performed  using the standard protocol following bolus administration of intravenous contrast. CONTRAST:  62mL OMNIPAQUE IOHEXOL 300 MG/ML  SOLN COMPARISON:  CT abdomen pelvis 12/19/2018 FINDINGS: Lower chest: No acute abnormality. Hepatobiliary: No focal liver abnormality. Status post cholecystectomy. Launch enlarged common bile duct with no intrahepatic biliary dilatation. Pancreas: No focal lesion. Normal pancreatic contour. No surrounding inflammatory changes. No main pancreatic ductal dilatation. Spleen: Normal in size without focal abnormality. Adrenals/Urinary Tract: No adrenal nodule bilaterally. Bilateral kidneys enhance symmetrically. No hydronephrosis. No hydroureter. The urinary bladder is unremarkable. On delayed imaging, there is no urothelial wall thickening and there are no filling defects in the opacified portions of the bilateral collecting systems or ureters. Stomach/Bowel: Stomach is within normal limits. No evidence of bowel wall thickening or dilatation. Appendix appears normal. Vascular/Lymphatic: No abdominal aorta or iliac aneurysm. No abdominal, pelvic, or inguinal lymphadenopathy. Reproductive: Status post hysterectomy. No adnexal masses. Other: No intraperitoneal free fluid. No intraperitoneal free gas. No organized fluid collection. Musculoskeletal: No abdominal wall hernia or abnormality. No suspicious lytic  or blastic osseous lesions. No acute displaced fracture. Similar-appearing L5 compression fracture. IMPRESSION: 1. No acute intra-abdominal or intrapelvic abnormality. 2. Chronic similar-appearing L5 compression fracture. Electronically Signed   By: Iven Finn M.D.   On: 01/20/2021 22:22    Procedures Procedures   Medications Ordered in ED Medications  lactated ringers bolus 1,000 mL (has no administration in time range)  diphenhydrAMINE (BENADRYL) injection 25 mg (has no administration in time range)  HYDROmorphone (DILAUDID) injection 0.5 mg (0.5 mg Intravenous Given 01/20/21  1940)    ED Course  I have reviewed the triage vital signs and the nursing notes.  Pertinent labs & imaging results that were available during my care of the patient were reviewed by me and considered in my medical decision making (see chart for details).    MDM Rules/Calculators/A&P                         Patient presents today with worsening abdominal and back pain over the past week.  Also with inability to tolerate oral intake, and intractable vomiting.  She was seen at [facility] yesterday, fluid bolus given, IV narcotics, and IV antiemetics with improvement.  Discharged with oral and suppository antiemetics.  Patient returns today due to lack of relief with prescription antiemetics.  Still unable to tolerate oral intake or take her home meds.  Patient noted to be extremely tearful on exam and significantly tender throughout her abdomen.  CT scan obtained for further evaluation of same without remarkable findings.  Patient noted to have a UTI, she self caths due to symptoms related to interstitial cystitis.  Unable to tolerate oral antibiotics given intractable vomiting.  CBC and CMP mostly unchanged from yesterday, slight increase in lipase from 25--> 53.  Relatively unremarkable work-up today, however given second visit in 24 hours combined with intractable vomiting unable to be managed without IV antiemetics and therefore inability to take home meds, feel patient needs to be admitted for IV rehydration, UTI treatment, and further work-up of her intractable vomiting.  Patient is amenable with plan of admission.  Hospitalist Dr. Marlowe Sax consulted and agrees to admit patient.   Final Clinical Impression(s) / ED Diagnoses Final diagnoses:  Intractable vomiting with nausea    Rx / DC Orders ED Discharge Orders     None        Nestor Lewandowsky 01/21/21 0241    Regan Lemming, MD 01/21/21 1327

## 2021-01-20 NOTE — ED Notes (Signed)
Pt given cup of ice water per request.  

## 2021-01-21 DIAGNOSIS — N390 Urinary tract infection, site not specified: Secondary | ICD-10-CM | POA: Diagnosis present

## 2021-01-21 DIAGNOSIS — I10 Essential (primary) hypertension: Secondary | ICD-10-CM | POA: Diagnosis present

## 2021-01-21 DIAGNOSIS — B3781 Candidal esophagitis: Secondary | ICD-10-CM | POA: Diagnosis present

## 2021-01-21 DIAGNOSIS — J42 Unspecified chronic bronchitis: Secondary | ICD-10-CM

## 2021-01-21 DIAGNOSIS — R1312 Dysphagia, oropharyngeal phase: Secondary | ICD-10-CM | POA: Diagnosis not present

## 2021-01-21 DIAGNOSIS — E785 Hyperlipidemia, unspecified: Secondary | ICD-10-CM | POA: Diagnosis present

## 2021-01-21 DIAGNOSIS — F319 Bipolar disorder, unspecified: Secondary | ICD-10-CM

## 2021-01-21 DIAGNOSIS — D649 Anemia, unspecified: Secondary | ICD-10-CM | POA: Diagnosis present

## 2021-01-21 DIAGNOSIS — M4856XA Collapsed vertebra, not elsewhere classified, lumbar region, initial encounter for fracture: Secondary | ICD-10-CM | POA: Diagnosis present

## 2021-01-21 DIAGNOSIS — Z8719 Personal history of other diseases of the digestive system: Secondary | ICD-10-CM

## 2021-01-21 DIAGNOSIS — N3000 Acute cystitis without hematuria: Principal | ICD-10-CM

## 2021-01-21 DIAGNOSIS — N3010 Interstitial cystitis (chronic) without hematuria: Secondary | ICD-10-CM | POA: Diagnosis not present

## 2021-01-21 DIAGNOSIS — R079 Chest pain, unspecified: Secondary | ICD-10-CM | POA: Diagnosis not present

## 2021-01-21 DIAGNOSIS — K859 Acute pancreatitis without necrosis or infection, unspecified: Secondary | ICD-10-CM | POA: Diagnosis present

## 2021-01-21 DIAGNOSIS — B9620 Unspecified Escherichia coli [E. coli] as the cause of diseases classified elsewhere: Secondary | ICD-10-CM | POA: Diagnosis present

## 2021-01-21 DIAGNOSIS — D75839 Thrombocytosis, unspecified: Secondary | ICD-10-CM | POA: Diagnosis present

## 2021-01-21 DIAGNOSIS — Q453 Other congenital malformations of pancreas and pancreatic duct: Secondary | ICD-10-CM | POA: Diagnosis not present

## 2021-01-21 DIAGNOSIS — F5089 Other specified eating disorder: Secondary | ICD-10-CM | POA: Diagnosis not present

## 2021-01-21 DIAGNOSIS — M797 Fibromyalgia: Secondary | ICD-10-CM | POA: Diagnosis not present

## 2021-01-21 DIAGNOSIS — E162 Hypoglycemia, unspecified: Secondary | ICD-10-CM | POA: Diagnosis present

## 2021-01-21 DIAGNOSIS — R748 Abnormal levels of other serum enzymes: Secondary | ICD-10-CM | POA: Diagnosis not present

## 2021-01-21 DIAGNOSIS — R569 Unspecified convulsions: Secondary | ICD-10-CM | POA: Diagnosis present

## 2021-01-21 DIAGNOSIS — E43 Unspecified severe protein-calorie malnutrition: Secondary | ICD-10-CM | POA: Diagnosis present

## 2021-01-21 DIAGNOSIS — Z681 Body mass index (BMI) 19 or less, adult: Secondary | ICD-10-CM | POA: Diagnosis not present

## 2021-01-21 DIAGNOSIS — Z20822 Contact with and (suspected) exposure to covid-19: Secondary | ICD-10-CM | POA: Diagnosis present

## 2021-01-21 DIAGNOSIS — R112 Nausea with vomiting, unspecified: Secondary | ICD-10-CM | POA: Diagnosis not present

## 2021-01-21 DIAGNOSIS — F17210 Nicotine dependence, cigarettes, uncomplicated: Secondary | ICD-10-CM | POA: Diagnosis present

## 2021-01-21 DIAGNOSIS — F1120 Opioid dependence, uncomplicated: Secondary | ICD-10-CM | POA: Diagnosis present

## 2021-01-21 DIAGNOSIS — J449 Chronic obstructive pulmonary disease, unspecified: Secondary | ICD-10-CM | POA: Diagnosis present

## 2021-01-21 DIAGNOSIS — R1313 Dysphagia, pharyngeal phase: Secondary | ICD-10-CM | POA: Diagnosis not present

## 2021-01-21 DIAGNOSIS — E876 Hypokalemia: Secondary | ICD-10-CM | POA: Diagnosis present

## 2021-01-21 DIAGNOSIS — R627 Adult failure to thrive: Secondary | ICD-10-CM | POA: Diagnosis present

## 2021-01-21 DIAGNOSIS — B370 Candidal stomatitis: Secondary | ICD-10-CM | POA: Diagnosis present

## 2021-01-21 DIAGNOSIS — F17200 Nicotine dependence, unspecified, uncomplicated: Secondary | ICD-10-CM

## 2021-01-21 DIAGNOSIS — K861 Other chronic pancreatitis: Secondary | ICD-10-CM | POA: Diagnosis present

## 2021-01-21 DIAGNOSIS — R1310 Dysphagia, unspecified: Secondary | ICD-10-CM | POA: Diagnosis not present

## 2021-01-21 LAB — HIV ANTIBODY (ROUTINE TESTING W REFLEX): HIV Screen 4th Generation wRfx: NONREACTIVE

## 2021-01-21 LAB — GLUCOSE, CAPILLARY: Glucose-Capillary: 82 mg/dL (ref 70–99)

## 2021-01-21 LAB — RAPID URINE DRUG SCREEN, HOSP PERFORMED
Amphetamines: NOT DETECTED
Barbiturates: NOT DETECTED
Benzodiazepines: NOT DETECTED
Cocaine: NOT DETECTED
Opiates: POSITIVE — AB
Tetrahydrocannabinol: POSITIVE — AB

## 2021-01-21 LAB — RESP PANEL BY RT-PCR (FLU A&B, COVID) ARPGX2
Influenza A by PCR: NEGATIVE
Influenza B by PCR: NEGATIVE
SARS Coronavirus 2 by RT PCR: NEGATIVE

## 2021-01-21 LAB — PHOSPHORUS
Phosphorus: 3 mg/dL (ref 2.5–4.6)
Phosphorus: 2.8 mg/dL (ref 2.5–4.6)

## 2021-01-21 LAB — MAGNESIUM
Magnesium: 1.8 mg/dL (ref 1.7–2.4)
Magnesium: 1.8 mg/dL (ref 1.7–2.4)

## 2021-01-21 MED ORDER — HYDROMORPHONE HCL 1 MG/ML IJ SOLN
1.0000 mg | Freq: Once | INTRAMUSCULAR | Status: AC
  Administered 2021-01-21: 1 mg via INTRAVENOUS
  Filled 2021-01-21: qty 1

## 2021-01-21 MED ORDER — GABAPENTIN 600 MG PO TABS: 600.0000 mg | ORAL_TABLET | Freq: Three times a day (TID) | ORAL | Status: DC

## 2021-01-21 MED ORDER — ARFORMOTEROL TARTRATE 15 MCG/2ML IN NEBU
15.0000 ug | INHALATION_SOLUTION | Freq: Two times a day (BID) | RESPIRATORY_TRACT | Status: DC
  Administered 2021-01-21 – 2021-02-01 (×20): 15 ug via RESPIRATORY_TRACT
  Filled 2021-01-21 (×24): qty 2

## 2021-01-21 MED ORDER — VORTIOXETINE HBR 5 MG PO TABS
10.0000 mg | ORAL_TABLET | Freq: Every day | ORAL | Status: DC
  Administered 2021-01-21 – 2021-01-22 (×2): 10 mg via ORAL
  Administered 2021-01-23 (×2): 5 mg via ORAL
  Administered 2021-01-24 – 2021-02-01 (×9): 10 mg via ORAL
  Filled 2021-01-21 (×13): qty 2

## 2021-01-21 MED ORDER — GUAIFENESIN ER 600 MG PO TB12
600.0000 mg | ORAL_TABLET | Freq: Two times a day (BID) | ORAL | Status: DC
  Administered 2021-01-21 – 2021-01-31 (×16): 600 mg via ORAL
  Filled 2021-01-21 (×21): qty 1

## 2021-01-21 MED ORDER — POTASSIUM CHLORIDE CRYS ER 20 MEQ PO TBCR
40.0000 meq | EXTENDED_RELEASE_TABLET | ORAL | Status: AC
  Administered 2021-01-21: 40 meq via ORAL
  Filled 2021-01-21: qty 2

## 2021-01-21 MED ORDER — PROMETHAZINE HCL 25 MG/ML IJ SOLN
INTRAMUSCULAR | Status: AC
  Administered 2021-01-21: 25 mg
  Filled 2021-01-21: qty 1

## 2021-01-21 MED ORDER — OXYBUTYNIN CHLORIDE ER 15 MG PO TB24
15.0000 mg | ORAL_TABLET | Freq: Every day | ORAL | Status: DC
  Administered 2021-01-21 – 2021-02-01 (×10): 15 mg via ORAL
  Filled 2021-01-21 (×12): qty 1

## 2021-01-21 MED ORDER — OXYCODONE HCL 5 MG PO TABS
5.0000 mg | ORAL_TABLET | Freq: Four times a day (QID) | ORAL | Status: DC | PRN
  Administered 2021-01-21 – 2021-02-01 (×15): 5 mg via ORAL
  Filled 2021-01-21 (×16): qty 1

## 2021-01-21 MED ORDER — CYCLOSPORINE 0.05 % OP EMUL
1.0000 [drp] | Freq: Two times a day (BID) | OPHTHALMIC | Status: DC
  Administered 2021-01-21 – 2021-02-01 (×22): 1 [drp] via OPHTHALMIC
  Filled 2021-01-21 (×24): qty 30

## 2021-01-21 MED ORDER — ESTRADIOL 0.1 MG/GM VA CREA: 1.0000 | TOPICAL_CREAM | VAGINAL | Status: DC

## 2021-01-21 MED ORDER — MIRTAZAPINE 15 MG PO TABS
15.0000 mg | ORAL_TABLET | Freq: Every day | ORAL | Status: DC
  Administered 2021-01-21 – 2021-01-31 (×11): 15 mg via ORAL
  Filled 2021-01-21 (×11): qty 1

## 2021-01-21 MED ORDER — QUETIAPINE FUMARATE 100 MG PO TABS
400.0000 mg | ORAL_TABLET | Freq: Every day | ORAL | Status: DC
  Administered 2021-01-21 – 2021-01-31 (×11): 400 mg via ORAL
  Filled 2021-01-21: qty 8
  Filled 2021-01-21: qty 4
  Filled 2021-01-21: qty 8
  Filled 2021-01-21 (×8): qty 4

## 2021-01-21 MED ORDER — PANTOPRAZOLE SODIUM 40 MG IV SOLR
40.0000 mg | Freq: Two times a day (BID) | INTRAVENOUS | Status: DC
  Administered 2021-01-21 – 2021-01-30 (×20): 40 mg via INTRAVENOUS
  Filled 2021-01-21 (×21): qty 40

## 2021-01-21 MED ORDER — POTASSIUM CHLORIDE IN NACL 20-0.45 MEQ/L-% IV SOLN
INTRAVENOUS | Status: DC
  Filled 2021-01-21 (×2): qty 1000

## 2021-01-21 MED ORDER — SODIUM CHLORIDE 0.9% FLUSH
3.0000 mL | Freq: Two times a day (BID) | INTRAVENOUS | Status: DC
  Administered 2021-01-21 – 2021-02-01 (×21): 3 mL via INTRAVENOUS

## 2021-01-21 MED ORDER — HYDROXYZINE HCL 25 MG PO TABS
50.0000 mg | ORAL_TABLET | Freq: Every day | ORAL | Status: DC
  Administered 2021-01-21 – 2021-01-31 (×11): 50 mg via ORAL
  Filled 2021-01-21 (×11): qty 2

## 2021-01-21 MED ORDER — SODIUM CHLORIDE 0.9 % IV SOLN: INTRAVENOUS | Status: DC

## 2021-01-21 MED ORDER — HYDROMORPHONE HCL 1 MG/ML IJ SOLN
0.5000 mg | Freq: Once | INTRAMUSCULAR | Status: AC | PRN
  Administered 2021-01-21: 0.5 mg via INTRAVENOUS
  Filled 2021-01-21: qty 0.5

## 2021-01-21 MED ORDER — ONDANSETRON HCL 4 MG/2ML IJ SOLN
4.0000 mg | Freq: Once | INTRAMUSCULAR | Status: AC
  Administered 2021-01-21: 4 mg via INTRAVENOUS
  Filled 2021-01-21: qty 2

## 2021-01-21 MED ORDER — FREE WATER
100.0000 mL | Status: AC
  Administered 2021-01-22 – 2021-01-28 (×33): 100 mL

## 2021-01-21 MED ORDER — NICOTINE 14 MG/24HR TD PT24
14.0000 mg | MEDICATED_PATCH | Freq: Once | TRANSDERMAL | Status: AC
  Administered 2021-01-21: 14 mg via TRANSDERMAL
  Filled 2021-01-21: qty 1

## 2021-01-21 MED ORDER — ALBUTEROL SULFATE (2.5 MG/3ML) 0.083% IN NEBU: 2.5000 mg | INHALATION_SOLUTION | Freq: Four times a day (QID) | RESPIRATORY_TRACT | Status: DC | PRN

## 2021-01-21 MED ORDER — SODIUM CHLORIDE 0.9 % IV SOLN
12.5000 mg | Freq: Four times a day (QID) | INTRAVENOUS | Status: DC | PRN
  Administered 2021-01-21 – 2021-01-26 (×9): 12.5 mg via INTRAVENOUS
  Filled 2021-01-21 (×2): qty 12.5
  Filled 2021-01-21: qty 0.5
  Filled 2021-01-21: qty 12.5
  Filled 2021-01-21: qty 0.5
  Filled 2021-01-21: qty 12.5
  Filled 2021-01-21: qty 0.5
  Filled 2021-01-21 (×3): qty 12.5

## 2021-01-21 MED ORDER — ESTRADIOL 1 MG PO TABS
2.0000 mg | ORAL_TABLET | Freq: Every day | ORAL | Status: DC
  Administered 2021-01-22 – 2021-02-01 (×11): 2 mg via ORAL
  Filled 2021-01-21 (×10): qty 2
  Filled 2021-01-21: qty 1
  Filled 2021-01-21 (×2): qty 2

## 2021-01-21 MED ORDER — BUDESONIDE 0.5 MG/2ML IN SUSP
0.5000 mg | Freq: Two times a day (BID) | RESPIRATORY_TRACT | Status: DC
  Administered 2021-01-21 – 2021-02-01 (×20): 0.5 mg via RESPIRATORY_TRACT
  Filled 2021-01-21 (×23): qty 2

## 2021-01-21 MED ORDER — GABAPENTIN 400 MG PO CAPS
800.0000 mg | ORAL_CAPSULE | Freq: Four times a day (QID) | ORAL | Status: DC
  Administered 2021-01-21 – 2021-02-01 (×42): 800 mg via ORAL
  Filled 2021-01-21 (×43): qty 2

## 2021-01-21 MED ORDER — HYOSCYAMINE SULFATE 0.125 MG SL SUBL
0.1250 mg | SUBLINGUAL_TABLET | SUBLINGUAL | Status: DC | PRN
  Administered 2021-01-22 – 2021-01-31 (×3): 0.125 mg via ORAL
  Filled 2021-01-21 (×4): qty 1

## 2021-01-21 MED ORDER — CYCLOBENZAPRINE HCL 5 MG PO TABS
5.0000 mg | ORAL_TABLET | Freq: Three times a day (TID) | ORAL | Status: DC | PRN
  Administered 2021-01-21 – 2021-01-28 (×5): 5 mg via ORAL
  Filled 2021-01-21 (×5): qty 1

## 2021-01-21 MED ORDER — ENOXAPARIN SODIUM 30 MG/0.3ML IJ SOSY
30.0000 mg | PREFILLED_SYRINGE | INTRAMUSCULAR | Status: DC
  Administered 2021-01-21 – 2021-01-26 (×6): 30 mg via SUBCUTANEOUS
  Filled 2021-01-21 (×6): qty 0.3

## 2021-01-21 MED ORDER — ACETAMINOPHEN 325 MG PO TABS
650.0000 mg | ORAL_TABLET | Freq: Four times a day (QID) | ORAL | Status: DC | PRN
  Administered 2021-01-28 (×2): 650 mg via ORAL
  Filled 2021-01-21 (×3): qty 2

## 2021-01-21 MED ORDER — SODIUM CHLORIDE 0.9 % IV SOLN
1.0000 g | INTRAVENOUS | Status: DC
  Administered 2021-01-21 – 2021-01-25 (×5): 1 g via INTRAVENOUS
  Filled 2021-01-21 (×5): qty 10

## 2021-01-21 MED ORDER — HYDROMORPHONE HCL 1 MG/ML IJ SOLN
0.5000 mg | INTRAMUSCULAR | Status: AC | PRN
  Administered 2021-01-21 (×3): 0.5 mg via INTRAVENOUS
  Filled 2021-01-21 (×3): qty 0.5

## 2021-01-21 MED ORDER — CLONAZEPAM 0.5 MG PO TABS
0.5000 mg | ORAL_TABLET | Freq: Three times a day (TID) | ORAL | Status: DC | PRN
  Administered 2021-01-21 – 2021-02-01 (×25): 0.5 mg via ORAL
  Filled 2021-01-21 (×25): qty 1

## 2021-01-21 MED ORDER — FLUTICASONE PROPIONATE 50 MCG/ACT NA SUSP
2.0000 | Freq: Every day | NASAL | Status: DC
  Administered 2021-01-21 – 2021-02-01 (×8): 2 via NASAL
  Filled 2021-01-21: qty 16

## 2021-01-21 MED ORDER — CHLORHEXIDINE GLUCONATE CLOTH 2 % EX PADS
6.0000 | MEDICATED_PAD | Freq: Every day | CUTANEOUS | Status: DC
  Administered 2021-01-21 – 2021-02-01 (×10): 6 via TOPICAL

## 2021-01-21 MED ORDER — OSMOLITE 1.2 CAL PO LIQD
1000.0000 mL | ORAL | Status: AC
  Administered 2021-01-22 – 2021-01-28 (×5): 1000 mL
  Filled 2021-01-21 (×13): qty 1000

## 2021-01-21 MED ORDER — GABAPENTIN 800 MG PO TABS: 800.0000 mg | ORAL_TABLET | Freq: Three times a day (TID) | ORAL | Status: DC

## 2021-01-21 MED ORDER — DEXTROSE 50 % IV SOLN
INTRAVENOUS | Status: AC
  Administered 2021-01-21: 50 mL
  Filled 2021-01-21: qty 50

## 2021-01-21 NOTE — Progress Notes (Signed)
Cortrak Tube Team Note:  Consult received to place a Cortrak feeding tube.   Cortrak tube unsuccessful, pt did not tolerate procedure well. Cortrak team also experienced difficulty advancing tube into good position. Pt did not want to proceed and would rather have G-J tube placement. RN notified, communication with MD was made. MD plans to consult surgery for permanent feeding tube. MD plan for radiology to place temporary NG if able.    Hermina Barters BS, PLDN Clinical Dietitian See [facility] for contact information.

## 2021-01-21 NOTE — Progress Notes (Signed)
Patient stating she wants something for pain, but refuses to take tylenol. Notified MD.

## 2021-01-21 NOTE — Progress Notes (Signed)
Initial Nutrition Assessment  DOCUMENTATION CODES:  Severe malnutrition in context of chronic illness, Underweight  INTERVENTION:  Place post-pyloric Cortrak tube.  Initiate tube feeding via Cortrak: Osmolite 1.2 at 25 ml/h and increase by 10 ml every 12 hrs to goal rate of 65 ml/h (1560 ml per day)  100 ml free water flushes q 4 hrs.  Provides 1872 kcal, 87 gm protein, 1279 ml free water daily (including flushes 1879 ml of free water daily).  Monitor magnesium, potassium, and phosphorus daily for at least 3 days, MD to replete as needed, as pt is at risk for refeeding syndrome.  NUTRITION DIAGNOSIS:  Severe Malnutrition related to chronic illness (chronic nausea/vomiting) as evidenced by severe fat depletion, severe muscle depletion, energy intake < or equal to 75% for > or equal to 1 month.  GOAL:  Patient will meet greater than or equal to 90% of their needs, Weight gain  MONITOR:  TF tolerance, Labs, Weight trends, Skin, I & O's  REASON FOR ASSESSMENT:  Malnutrition Screening Tool    ASSESSMENT:  45 yo female with a PMH of HTN, HLD, bipolar disorder, IBS, pancreatitis, interstitial cystitis, and COPD who presents with UTI. Vomiting over the last week, decreased PO intake.  Spoke with pt at bedside. Pt very upset and nervous regarding not being able to keep food/liquids down for quite sometime now. She reports that she has been struggling with keeping her weight up for the past 2 years. The lowest her weight was at 71 lbs two years ago.   She reports that on good days, she can only keep water and sometimes an ice pop down.  Pt's weight appears to be mostly stable. Pt has lost about ~4 lbs (4.1%) over the last 4 months, which is not necessarily significant for the time frame.  Recommend Cortrak placement and starting tube feeding. Spoke with MD regarding this. MD in agreement. RD placed order for Cortrak tube - RD to place order for tube feeding.  Pt at significant  refeeding risk given history of poor PO intake.  Medications: reviewed; Remeron, Protonix, NaCl with K-Cl 20 mEq @ 75 ml/hr, oxycodone PO PRN (given once today), Phenergan PRN per IV (given once today)  Labs: reviewed; K 3.1 (L), Glucose 107 (H)  NUTRITION - FOCUSED PHYSICAL EXAM: Flowsheet Row Most Recent Value  Orbital Region Severe depletion  Upper Arm Region Severe depletion  Thoracic and Lumbar Region Severe depletion  Buccal Region Moderate depletion  Temple Region Severe depletion  Clavicle Bone Region Severe depletion  Clavicle and Acromion Bone Region Severe depletion  Scapular Bone Region Severe depletion  Dorsal Hand Severe depletion  Patellar Region Severe depletion  Anterior Thigh Region Severe depletion  Posterior Calf Region Severe depletion  Edema (RD Assessment) None  Hair Reviewed  Eyes Reviewed  Mouth Reviewed  Skin Reviewed  Nails Reviewed   Diet Order:   Diet Order             Diet regular Room service appropriate? Yes; Fluid consistency: Thin  Diet effective now                  EDUCATION NEEDS:  Education needs have been addressed  Skin:  Skin Assessment: Reviewed RN Assessment  Last BM:  PTA/unknown  Height:  Ht Readings from Last 1 Encounters:  01/20/21 5\' 4"  (1.626 m)   Weight:  Wt Readings from Last 1 Encounters:  01/21/21 39.9 kg   BMI:  Body mass index is 15.1 kg/m.  Estimated Nutritional Needs:  Kcal:  4758-3074 Protein:  55-70 grams Fluid:  >1.8 L  Derrel Nip, RD, LDN (she/her/hers) Registered Dietitian I After-Hours/Weekend Pager # [phone]

## 2021-01-21 NOTE — Progress Notes (Signed)
Patient requesting for foley to be removed. MD at bedside, ok to be removed.

## 2021-01-21 NOTE — H&P (Addendum)
History and Physical    Lindsay VARGUS JOA:[id number] DOB: 1975-04-26 DOA: 01/20/2021  Referring MD/NP/PA: Shela Leff, MD PCP: Neale Burly, MD  Patient coming from: Transfer from [facility]  Chief Complaint: Abdominal pain with nausea and vomiting  I have personally briefly reviewed patient's old medical records in [place]   HPI: [name] is a 45 y.o. female with medical history significant of hypertension, hyperlipidemia, chronic interstitial cystitis, COPD, pancreatitis, bipolar disorder, tobacco abuse, anxiety, and depression who presents with complaints abdominal pain with nausea and vomiting over the last 2 weeks.  She reports having severe squeezing midline and lower abdominal pain with bladder spasms.  Emesis has remained nonbloody in appearance.  Ms. Kohut relates most of her symptoms are related to her history of interstitial cystitis and pancreatitis.  At baseline she in and out caths due to her inability to urinate.  Here lately she reports having worsening burning and discomfort with urinating.  She frequently gets urinary tract infections, but reports last required antibiotics was a couple months ago.  She is scheduled to undergo a cystoscopy with hydrodistention/Botox injection procedure with Dr. Amalia Hailey of urology on 10/21.  However, due to due to her symptoms she has been unable to keep any significant amount of food or liquids down.  Patient reports that her pain has been out of control as she has been unable to take any of her pain medications.  She had gone to the emergency department 2 days ago and ultimately was discharged home with antiemetics including suppositories.  Despite trying to use antiemetics she still is unable to keep anything down.  Over the last 2 years patient reports that she had gone from 130 pounds down to as low as 70 pounds, but currently was sitting around 90 pounds.  Other associated symptoms include complaints of anxiety, productive cough,  some shortness of breath, and chronic chills.  ED Course: Upon admission to the emergency department patient was seen to be afebrile, respirations 20-24, blood pressure 89/67-120 7/88, and all other vital signs maintained.  Labs from 10/13 significant for platelets 439, potassium 3.1, and lipase 53.  Urinalysis was positive for small leukocytes, positive nitrites, many bacteria, 6-10 squamous epithelial cells, and 6-10 WBCs.  CT scan of the abdomen and pelvis noted note acute intra-abdominal intrapelvic abnormality with chronic appearing L5 compression fracture.  Patient has been given 1 L of lactated Ringer's, antiemetics, and pain medication.  Review of Systems  Constitutional:  Positive for chills, malaise/fatigue and weight loss. Negative for fever.  HENT:  Negative for nosebleeds.   Eyes:  Negative for photophobia and pain.  Respiratory:  Positive for cough, sputum production and shortness of breath.   Cardiovascular:  Negative for chest pain and leg swelling.  Gastrointestinal:  Positive for abdominal pain, nausea and vomiting.  Genitourinary:  Positive for dysuria and urgency.  Musculoskeletal:  Positive for back pain. Negative for falls.  Skin:  Negative for rash.  Neurological:  Negative for focal weakness and loss of consciousness.  Psychiatric/Behavioral:  Positive for substance abuse. The patient is nervous/anxious.    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 Melvyn Novas.....-HFA 75% 12/04/2008>75% 02/05/2009>75% 08/04/2009 -PFT's 01/04/2009 2.56 (86%) ratio 75, no resp to B2 and DLC0 67% > 80 after correction     Cancer (HCC)    cervical cancer   Chronic interstitial cystitis 03/11/2009   Chronic nausea    Chronic pain    Colon polyps    COLONIC POLYPS, HX OF 07/25/2006   ADENOMATOUS POLYP   COPD (chronic obstructive pulmonary disease) ([place])    DEPRESSION 08/31/2007   Endometriosis    FIBROMYALGIA 08/31/2007   Fibromyalgia    GERD 02/05/2009   Hyperlipidemia    HYPERTENSION 08/31/2007   IBS 03/11/2009   Internal hemorrhoids    Migraine headache    NEPHROLITHIASIS 08/31/2007   Pancreatitis    PONV (postoperative nausea and vomiting)    RECTAL BLEEDING 03/11/2009   Sciatica    right leg   Seizures ([place])    been about 1 year since last seisure per pt   SLEEP APNEA 08/31/2007   Substance abuse ([place])    Thyroid disease    Uterine cyst     Past Surgical History:  Procedure Laterality Date   ABDOMINAL HYSTERECTOMY     BIOPSY  10/25/2018   Procedure: BIOPSY;  Surgeon: Rogene Houston, MD;  [facility];  Service: Endoscopy;;  gastric   BIOPSY  06/11/2020   Procedure: BIOPSY;  Surgeon: Montez Morita, Quillian Quince, MD;  Location: [facility];  Service: Gastroenterology;;  small bowel   bladder stretching x6     BLADDER SURGERY     stimulator placed and stretching    CHOLECYSTECTOMY     COLONOSCOPY     COLONOSCOPY WITH PROPOFOL N/A 09/03/2014   Procedure: COLONOSCOPY WITH PROPOFOL;  Surgeon: Milus Banister, MD;  [facility]: [facility];  Service: Endoscopy;  Laterality: N/A;   ESOPHAGOGASTRODUODENOSCOPY (EGD) WITH PROPOFOL N/A 10/25/2018   Procedure: ESOPHAGOGASTRODUODENOSCOPY (EGD) WITH PROPOFOL;  Surgeon: Rogene Houston, MD;  [facility];  Service: Endoscopy;  Laterality: N/A;  11:15   ESOPHAGOGASTRODUODENOSCOPY (EGD) WITH PROPOFOL N/A 06/11/2020   Procedure: ESOPHAGOGASTRODUODENOSCOPY (EGD) WITH PROPOFOL;  Surgeon: Harvel Quale, MD;  [facility];  Service: Gastroenterology;  Laterality: N/A;   interstitial cystitis     PACEMAKER INSERTION     in  hip for interstitial cystitis   pacemaker removal     removal of uterine cyst and scrapped uterus     replaced bladder pacemaker       reports that she has been smoking cigarettes. She has a 7.00 pack-year smoking history. She has never used smokeless tobacco. She reports that she does not currently use alcohol. She reports that she does not currently use drugs after having used the following drugs: Marijuana.  Allergies  Allergen Reactions   Abilify [Aripiprazole] Swelling, Palpitations and Other (See Comments)    Throat swelling, tremors    Amitriptyline Anaphylaxis    Angioedema   Metoclopramide Hcl Other (See Comments)    Causes seizures   Propoxyphene Rash   Toradol [Ketorolac Tromethamine]    Tramadol Swelling, Other (See Comments) and Rash    Throat swelling, tremors   Ambien [Zolpidem Tartrate] Other (See Comments)    hallucinations   Eszopiclone Other (See Comments)    Hallucinations, hyperactivity, and bad taste in mouth    Varenicline Other (See Comments)    Suicidal thoughts   Buprenorphine Hcl Itching and Hives   Demerol [Meperidine] Rash  Emetrol Hives, Itching and Rash   Morphine And Related Hives and Itching    Family History  Problem Relation Age of Onset   Heart disease Father    Asthma Maternal Grandmother    Emphysema Maternal Grandfather    Cancer Maternal Grandfather        Lung Cancer   Cancer Other        Lung Cancer-Aunt   Colon cancer Neg Hx    Esophageal cancer Neg Hx    Rectal cancer Neg Hx    Stomach cancer Neg Hx    Thyroid disease Neg Hx     Prior to Admission medications   Medication Sig Start Date End Date Taking? Authorizing Provider  mirtazapine (REMERON) 15 MG tablet Take 15 mg by mouth daily. 11/24/20  Yes [provider]  acetaminophen (TYLENOL) 500 MG tablet Take 1,000 mg by mouth every 6 (six) hours as needed for mild pain, fever or headache.    [provider]  albuterol (PROAIR HFA) 108 (90 Base) MCG/ACT  inhaler INHALE 2 PUFFS EVERY 6 HOURS AS NEEDED FOR SHORTNESS OF BREATH AND WHEEZING. Patient taking differently: Inhale 2 puffs into the lungs every 6 (six) hours as needed for shortness of breath or wheezing. 02/27/20   [name], DO  clonazePAM (KLONOPIN) 0.5 MG tablet Take 1 tablet (0.5 mg total) by mouth 3 (three) times daily as needed for anxiety. 05/01/19   [name], DO  cyclobenzaprine (FLEXERIL) 5 MG tablet Take 1 tablet (5 mg total) by mouth 3 (three) times daily as needed for muscle spasms. 05/21/20   Mordecai Rasmussen, MD  estradiol (ESTRACE) 0.1 MG/GM vaginal cream Place 1 Applicatorful vaginally every 3 (three) days. 11/21/19   [provider]  estradiol (ESTRACE) 2 MG tablet Take 2 mg by mouth daily.    [provider]  fluticasone (FLONASE) 50 MCG/ACT nasal spray Place 2 sprays into both nostrils daily. 04/22/19   [name], DO  gabapentin (NEURONTIN) 800 MG tablet Take 800 mg by mouth in the morning, at noon, in the evening, and at bedtime. 05/19/20   [provider]  HYDROcodone-acetaminophen (NORCO/VICODIN) 5-325 MG tablet Take 1 tablet by mouth 3 (three) times daily as needed for pain. 05/25/20   [provider]  hydrOXYzine (ATARAX/VISTARIL) 50 MG tablet Take 50 mg by mouth at bedtime. 08/26/18   [provider]  Hyoscyamine Sulfate SL 0.125 MG SUBL Take 0.125 mg by mouth every 4 (four) hours as needed (for cramping). 11/04/19   [provider]  lamoTRIgine (LAMICTAL) 100 MG tablet Take 100 mg by mouth 2 (two) times daily. 02/16/20   [provider]  omeprazole (PRILOSEC) 40 MG capsule TAKE 1 CAPSULE BY MOUTH ONCE DAILY. Patient taking differently: Take 40 mg by mouth daily. 06/22/20   Roma Schanz R, DO  ondansetron (ZOFRAN-ODT) 8 MG disintegrating tablet Take 1 tablet (8 mg total) by mouth every 8 (eight) hours as needed for nausea or vomiting. 16/10/96   Delora Fuel, MD  oxybutynin  (DITROPAN XL) 15 MG 24 hr tablet Take 15 mg by mouth daily. 09/16/19   [provider]  potassium chloride SA (KLOR-CON) 20 MEQ tablet Take 1 tablet (20 mEq total) by mouth 2 (two) times daily. 04/54/09   Delora Fuel, MD  prochlorperazine (COMPAZINE) 10 MG tablet Take 1 tablet (10 mg total) by mouth as needed (headache). 03/20/20   Caccavale, Sophia, PA-C  promethazine (PHENERGAN) 25 MG suppository  Place 1 suppository (25 mg total) rectally every 6 (six) hours as needed for nausea or vomiting. 60/10/93   Delora Fuel, MD  promethazine (PHENERGAN) 25 MG tablet TAKE (1) TABLET EVERY SIX HOURS AS NEEDED FOR NAUSEA AND VOMITING. Patient taking differently: Take 25 mg by mouth every 6 (six) hours as needed for nausea or vomiting. 11/08/20   Roma Schanz R, DO  QUEtiapine (SEROQUEL) 400 MG tablet Take 400 mg by mouth at bedtime.  09/16/18   [provider]  RESTASIS 0.05 % ophthalmic emulsion Place 1 drop into both eyes 2 (two) times daily. 02/26/20   [provider]  SYMBICORT 80-4.5 MCG/ACT inhaler INHALE 2 PUFFS INTO THE LUNGS TWICE DAILY. Patient taking differently: Inhale 2 puffs into the lungs in the morning and at bedtime. 12/28/20   Saguier, Percell Miller, PA-C    Physical Exam:  Constitutional: Cachectic middle-age female who appears to be in distress Vitals:   01/20/21 2330 01/21/21 0519 01/21/21 0620 01/21/21 0908  BP: 111/61 98/60 127/88 124/81  Pulse: 68 60 60 (!) 59  Resp:  20 20 19   Temp:  97.8 F (36.6 C) 98.1 F (36.7 C) 98.4 F (36.9 C)  TempSrc:  Oral Oral Oral  SpO2: 97% 100% 99% 99%  Weight:   39.9 kg   Height:       Eyes: PERRL, lids and conjunctivae normal ENMT: Mucous membranes are dry Posterior pharynx clear of any exudate or lesions.  Neck: normal, supple, no masses, no thyromegaly Respiratory: clear to auscultation bilaterally, no wheezing, no crackles. Normal respiratory effort. No accessory muscle use.  Cardiovascular: Regular rate and  rhythm, no murmurs / rubs / gallops. No extremity edema. 2+ pedal pulses. No carotid bruits.  Abdomen: Tenderness to palpation noted of the lower abdomen and midline.  Bowel sounds positive.  Musculoskeletal: no clubbing / cyanosis. No joint deformity upper and lower extremities. Good ROM, no contractures. Normal muscle tone.  Skin: no rashes, lesions, ulcers. No induration Neurologic: CN 2-12 grossly intact. Sensation intact, DTR normal. Strength 5/5 in all 4.  Psychiatric: Normal judgment and insight. Alert and oriented x 3. Normal mood.     Labs on Admission: I have personally reviewed following labs and imaging studies  CBC: Recent Labs  Lab 01/19/21 0226 01/20/21 1942  WBC 7.8 9.6  NEUTROABS  --  5.6  HGB 11.6* 12.9  HCT 34.4* 37.4  MCV 96.1 93.3  PLT 352 235*   Basic Metabolic Panel: Recent Labs  Lab 01/19/21 0226 01/20/21 1942  NA 135 137  K 3.1* 3.1*  CL 104 106  CO2 24 23  GLUCOSE 109* 107*  BUN 8 6  CREATININE 0.73 0.63  CALCIUM 9.2 9.0   GFR: Estimated Creatinine Clearance: 56.5 mL/min (by C-G formula based on SCr of 0.63 mg/dL). Liver Function Tests: Recent Labs  Lab 01/19/21 0226 01/20/21 1942  AST 22 19  ALT 40 26  ALKPHOS 69 79  BILITOT 0.5 0.3  PROT 7.0 7.5  ALBUMIN 4.0 4.4   Recent Labs  Lab 01/19/21 0226 01/20/21 1942  LIPASE 25 53*   No results for input(s): AMMONIA in the last 168 hours. Coagulation Profile: No results for input(s): INR, PROTIME in the last 168 hours. Cardiac Enzymes: No results for input(s): CKTOTAL, CKMB, CKMBINDEX, TROPONINI in the last 168 hours. BNP (last 3 results) No results for input(s): PROBNP in the last 8760 hours. HbA1C: No results for input(s): HGBA1C in the last 72 hours. CBG: No results for input(s):  GLUCAP in the last 168 hours. Lipid Profile: No results for input(s): CHOL, HDL, LDLCALC, TRIG, CHOLHDL, LDLDIRECT in the last 72 hours. Thyroid Function Tests: No results for input(s): TSH,  T4TOTAL, FREET4, T3FREE, THYROIDAB in the last 72 hours. Anemia Panel: No results for input(s): VITAMINB12, FOLATE, FERRITIN, TIBC, IRON, RETICCTPCT in the last 72 hours. Urine analysis:    Component Value Date/Time   COLORURINE YELLOW 01/20/2021 1935   APPEARANCEUR CLOUDY (A) 01/20/2021 1935   LABSPEC 1.025 01/20/2021 1935   PHURINE 6.0 01/20/2021 1935   GLUCOSEU NEGATIVE 01/20/2021 1935   HGBUR NEGATIVE 01/20/2021 1935   BILIRUBINUR NEGATIVE 01/20/2021 1935   BILIRUBINUR positive 10/02/2019 1038   [place] 01/20/2021 1935   PROTEINUR NEGATIVE 01/20/2021 1935   UROBILINOGEN 0.2 10/02/2019 1038   UROBILINOGEN 0.2 06/23/2014 1300   NITRITE POSITIVE (A) 01/20/2021 1935   LEUKOCYTESUR SMALL (A) 01/20/2021 1935   Sepsis Labs: Recent Results (from the past 240 hour(s))  Resp Panel by RT-PCR (Flu A&B, Covid) Nasopharyngeal Swab     Status: None   Collection Time: 01/21/21  4:31 AM   Specimen: Nasopharyngeal Swab; Nasopharyngeal(NP) swabs in vial transport medium  Result Value Ref Range Status   SARS Coronavirus 2 by RT PCR NEGATIVE NEGATIVE Final    Comment: (NOTE) SARS-CoV-2 target nucleic acids are NOT DETECTED.  The SARS-CoV-2 RNA is generally detectable in upper respiratory specimens during the acute phase of infection. The lowest concentration of SARS-CoV-2 viral copies this assay can detect is 138 copies/mL. A negative result does not preclude SARS-Cov-2 infection and should not be used as the sole basis for treatment or other patient management decisions. A negative result may occur with  improper specimen collection/handling, submission of specimen other than nasopharyngeal swab, presence of viral mutation(s) within the areas targeted by this assay, and inadequate number of viral copies(<138 copies/mL). A negative result must be combined with clinical observations, patient history, and epidemiological information. The expected result is Negative.  Fact Sheet  for Patients:  EntrepreneurPulse.com.au  Fact Sheet for Healthcare Providers:  IncredibleEmployment.be  This test is no t yet approved or cleared by the [place] FDA and  has been authorized for detection and/or diagnosis of SARS-CoV-2 by FDA under an Emergency Use Authorization (EUA). This EUA will remain  in effect (meaning this test can be used) for the duration of the COVID-19 declaration under Section 564(b)(1) of the Act, 21 U.S.C.section 360bbb-3(b)(1), unless the authorization is terminated  or revoked sooner.       Influenza A by PCR NEGATIVE NEGATIVE Final   Influenza B by PCR NEGATIVE NEGATIVE Final    Comment: (NOTE) The Xpert Xpress SARS-CoV-2/FLU/RSV plus assay is intended as an aid in the diagnosis of influenza from Nasopharyngeal swab specimens and should not be used as a sole basis for treatment. Nasal washings and aspirates are unacceptable for Xpert Xpress SARS-CoV-2/FLU/RSV testing.  Fact Sheet for Patients: EntrepreneurPulse.com.au  Fact Sheet for Healthcare Providers: IncredibleEmployment.be  This test is not yet approved or cleared by the [place] FDA and has been authorized for detection and/or diagnosis of SARS-CoV-2 by FDA under an Emergency Use Authorization (EUA). This EUA will remain in effect (meaning this test can be used) for the duration of the COVID-19 declaration under Section 564(b)(1) of the Act, 21 U.S.C. section 360bbb-3(b)(1), unless the authorization is terminated or revoked.  Performed at [facility], [address] [place], [place] [address]      Radiological Exams on Admission: CT  ABDOMEN PELVIS W CONTRAST  Result Date: 01/20/2021 CLINICAL DATA:  Nonlocalized acute abdominal pain. States that her bladder is not working and she needs a stimulator. She is having surgery in a week. Here today with back/abdominal pain and headache. EXAM:  CT ABDOMEN AND PELVIS WITH CONTRAST TECHNIQUE: Multidetector CT imaging of the abdomen and pelvis was performed using the standard protocol following bolus administration of intravenous contrast. CONTRAST:  35mL OMNIPAQUE IOHEXOL 300 MG/ML  SOLN COMPARISON:  CT abdomen pelvis 12/19/2018 FINDINGS: Lower chest: No acute abnormality. Hepatobiliary: No focal liver abnormality. Status post cholecystectomy. Launch enlarged common bile duct with no intrahepatic biliary dilatation. Pancreas: No focal lesion. Normal pancreatic contour. No surrounding inflammatory changes. No main pancreatic ductal dilatation. Spleen: Normal in size without focal abnormality. Adrenals/Urinary Tract: No adrenal nodule bilaterally. Bilateral kidneys enhance symmetrically. No hydronephrosis. No hydroureter. The urinary bladder is unremarkable. On delayed imaging, there is no urothelial wall thickening and there are no filling defects in the opacified portions of the bilateral collecting systems or ureters. Stomach/Bowel: Stomach is within normal limits. No evidence of bowel wall thickening or dilatation. Appendix appears normal. Vascular/Lymphatic: No abdominal aorta or iliac aneurysm. No abdominal, pelvic, or inguinal lymphadenopathy. Reproductive: Status post hysterectomy. No adnexal masses. Other: No intraperitoneal free fluid. No intraperitoneal free gas. No organized fluid collection. Musculoskeletal: No abdominal wall hernia or abnormality. No suspicious lytic or blastic osseous lesions. No acute displaced fracture. Similar-appearing L5 compression fracture. IMPRESSION: 1. No acute intra-abdominal or intrapelvic abnormality. 2. Chronic similar-appearing L5 compression fracture. Electronically Signed   By: Iven Finn M.D.   On: 01/20/2021 22:22    CT scan of abdomen pelvis: Independently reviewed.  No acute intra-abdominal abnormality appreciated  Assessment/Plan Suspect urinary tract infection chronic interstitial cystitis:  Acute.  Patient presents with complaints of severe abdominal pain with bladder spasms and dysuria.  Urinalysis was positive for small leukocytes, positive nitrites, many bacteria, 6-10 squamous epithelial cells, and 6-10 WBCs.  CT scan of the abdomen pelvis did not note any acute abnormalities.  She is followed by Dr. Amalia Hailey of urology with plans for procedure on 10/21.  Suspect possibility of a urinary tract infection superimposed on patient's chronic interstitial cystitis as a cause of. -Admit to a MedSurg bed -Check urine culture -Given empiric antibiotics of Rocephin -0.45% normal saline IV fluids with 20 mEq of KCl at 75 mL/h x 24 hours -Oxycodone/Dilaudid IV for moderate to severe pain respectively until able to get patient's pain under better control -Continue outpatient follow-up with Dr. Amalia Hailey  Nausea and vomiting: Patient reports being unable to keep any significant amount of food or liquids down due to persistent nausea and vomiting.  Suspect symptoms could be coming from possible UTI and/or related to withdrawals from her chronic opioids use.  Registered dietitian recommended placement of core track -Monitor intake and output -Cortrak per dietitian recommendation -Appreciate dietitian consultative services  Hypokalemia: Acute.  Potassium noted to be 3.1 on admission.  Suspect symptoms likely related with patient's nausea and vomiting. -Give 40 mEq of potassium chloride p.o. along with IV fluids  Elevated lipase history of pancreatitis: Patient initial lipase was mildly elevated at 53, did not appear to have any active signs of pancreatitis on CT scan. -Continue IV fluids  Thrombocytosis: Acute.  Platelet count elevated 439 on admission.  Suspect this is reactive in nature. -Continue to monitor  History of recurrent bronchitis: patient did report having some chest tightness and productive cough.  O2 saturations maintained on room air  and no wheezing appreciated on physical  exam. -Brovana and budesonide nebs substitute for Symbicort -Albuterol nebs as needed for shortness of breath/wheezing  Bipolar disorder: Home medication regimen includes Klonopin 1 tablet 3 times daily, mirtazapine 15 mg nightly, Trintellix 10 mg daily, and Seroquel 400 mg nightly.  She is followed by Dr. Erling Cruz of psychiatry at [facility]. Per review of records she was last seen on 11/24/2020 with a note verifying mirtazapine and Klonopin, Trintellix 10 mg daily.  That note does not make note of Seroquel. -Continue Klonopin, mirtazapine,Trintellix, and Seroquel  L5 compression fracture: Chronic in nature.  Patient reports having lower back pain which radiates down both her legs. -Dose of gabapentin recommended to be adjusted down to 600 mg 3 times daily for kidney function by pharmacy, but patient declined and requested that it be prescribed 800 mg 4 times daily as prescribed  Chronic pain fibromyalgia: Patient is followed in the outpatient setting by pain management.  She is on hydrocodone 5-325 mg 3 times daily as needed for pain. -Recommend transitioning back to home regimen of hydrocodone once pain under better control -Continue outpatient follow-up with pain management  Tobacco abuse: Patient still reports continued smoking. -Nicotine patch  Underweight: BMI 15.1 kg/m -Dietitian consulted  DVT prophylaxis: Lovenox Code Status: Full Family Communication: Mother updated at bedside Disposition Plan: Likely discharge home once medically stable Consults called: None Admission status: Inpatient, require more than 2 midnight stay due to need of IV antibiotics  Norval Morton MD [facility]   If 7PM-7AM, please contact night-coverage   01/21/2021, 9:29 AM

## 2021-01-21 NOTE — ED Notes (Signed)
Report given to Genia Hotter on [place] at [employer]

## 2021-01-21 NOTE — Progress Notes (Signed)
Patient requesting medication for anxiety and muscle spasms.  Notified MD.

## 2021-01-21 NOTE — ED Notes (Signed)
Informed pt I have messaged the admitting doctor for pain medication, awaiting orders

## 2021-01-22 ENCOUNTER — Inpatient Hospital Stay (HOSPITAL_COMMUNITY): Payer: BC Managed Care – PPO

## 2021-01-22 DIAGNOSIS — F319 Bipolar disorder, unspecified: Secondary | ICD-10-CM | POA: Diagnosis not present

## 2021-01-22 DIAGNOSIS — N3000 Acute cystitis without hematuria: Secondary | ICD-10-CM | POA: Diagnosis not present

## 2021-01-22 DIAGNOSIS — N3010 Interstitial cystitis (chronic) without hematuria: Secondary | ICD-10-CM | POA: Diagnosis not present

## 2021-01-22 DIAGNOSIS — R748 Abnormal levels of other serum enzymes: Secondary | ICD-10-CM | POA: Diagnosis not present

## 2021-01-22 LAB — CBC
HCT: 35.6 % — ABNORMAL LOW (ref 36.0–46.0)
Hemoglobin: 12 g/dL (ref 12.0–15.0)
MCH: 31.7 pg (ref 26.0–34.0)
MCHC: 33.7 g/dL (ref 30.0–36.0)
MCV: 93.9 fL (ref 80.0–100.0)
Platelets: 303 10*3/uL (ref 150–400)
RBC: 3.79 MIL/uL — ABNORMAL LOW (ref 3.87–5.11)
RDW: 12.8 % (ref 11.5–15.5)
WBC: 6.5 10*3/uL (ref 4.0–10.5)
nRBC: 0 % (ref 0.0–0.2)

## 2021-01-22 LAB — COMPREHENSIVE METABOLIC PANEL
ALT: 21 U/L (ref 0–44)
AST: 17 U/L (ref 15–41)
Albumin: 3.4 g/dL — ABNORMAL LOW (ref 3.5–5.0)
Alkaline Phosphatase: 56 U/L (ref 38–126)
Anion gap: 8 (ref 5–15)
BUN: <5 mg/dL — ABNORMAL LOW (ref 6–20)
CO2: 22 mmol/L (ref 22–32)
Calcium: 8.8 mg/dL — ABNORMAL LOW (ref 8.9–10.3)
Chloride: 109 mmol/L (ref 98–111)
Creatinine, Ser: 0.72 mg/dL (ref 0.44–1.00)
GFR, Estimated: >60 mL/min (ref >60)
Glucose, Bld: 56 mg/dL — ABNORMAL LOW (ref 70–99)
Potassium: 4 mmol/L (ref 3.5–5.1)
Sodium: 139 mmol/L (ref 135–145)
Total Bilirubin: 0.8 mg/dL (ref 0.3–1.2)
Total Protein: 6.1 g/dL — ABNORMAL LOW (ref 6.5–8.1)

## 2021-01-22 LAB — GLUCOSE, CAPILLARY
Glucose-Capillary: 110 mg/dL — ABNORMAL HIGH (ref 70–99)
Glucose-Capillary: 150 mg/dL — ABNORMAL HIGH (ref 70–99)
Glucose-Capillary: 173 mg/dL — ABNORMAL HIGH (ref 70–99)
Glucose-Capillary: 269 mg/dL — ABNORMAL HIGH (ref 70–99)
Glucose-Capillary: 43 mg/dL — CL (ref 70–99)
Glucose-Capillary: 56 mg/dL — ABNORMAL LOW (ref 70–99)
Glucose-Capillary: 69 mg/dL — ABNORMAL LOW (ref 70–99)
Glucose-Capillary: 88 mg/dL (ref 70–99)
Glucose-Capillary: 90 mg/dL (ref 70–99)

## 2021-01-22 LAB — PHOSPHORUS: Phosphorus: 3.4 mg/dL (ref 2.5–4.6)

## 2021-01-22 LAB — MAGNESIUM: Magnesium: 1.9 mg/dL (ref 1.7–2.4)

## 2021-01-22 MED ORDER — SODIUM CHLORIDE 0.9 % IV SOLN: INTRAVENOUS | Status: DC

## 2021-01-22 MED ORDER — DEXTROSE 50 % IV SOLN
12.5000 g | INTRAVENOUS | Status: AC
  Administered 2021-01-22: 12.5 g via INTRAVENOUS

## 2021-01-22 MED ORDER — DEXTROSE 50 % IV SOLN
INTRAVENOUS | Status: AC
  Filled 2021-01-22: qty 50

## 2021-01-22 MED ORDER — BENZOCAINE 20 % MT AERO
INHALATION_SPRAY | Freq: Two times a day (BID) | OROMUCOSAL | Status: DC | PRN
  Filled 2021-01-22: qty 57

## 2021-01-22 MED ORDER — DEXTROSE 50 % IV SOLN
INTRAVENOUS | Status: AC
  Administered 2021-01-22: 50 mL
  Filled 2021-01-22: qty 50

## 2021-01-22 MED ORDER — NICOTINE 21 MG/24HR TD PT24
21.0000 mg | MEDICATED_PATCH | Freq: Every day | TRANSDERMAL | Status: DC
  Administered 2021-01-23 – 2021-02-01 (×10): 21 mg via TRANSDERMAL
  Filled 2021-01-22 (×11): qty 1

## 2021-01-22 MED ORDER — HYDROMORPHONE HCL 1 MG/ML IJ SOLN
0.5000 mg | INTRAMUSCULAR | Status: AC | PRN
  Administered 2021-01-22 (×3): 0.5 mg via INTRAVENOUS
  Filled 2021-01-22 (×3): qty 0.5

## 2021-01-22 NOTE — Progress Notes (Signed)
Hypoglycemic Event  CBG: 43  Treatment: D50 50 mL (25 gm)  Symptoms: None  Follow-up CBG: Time:12:20 CBG Result: 269  Possible Reasons for Event: Vomiting/Inadequatemeal intake  Comments/MD notified:Yes    Suzan Garibaldi

## 2021-01-22 NOTE — Progress Notes (Signed)
PROGRESS NOTE  [name] RXV:[id number] DOB: 08/22/1975 DOA: 01/20/2021 PCP: Neale Burly, MD  HPI/Recap of past 24 hours: [name] is a 45 y.o. female with medical history significant of HTN, HLD, chronic interstitial cystitis, COPD, pancreatitis, bipolar disorder, tobacco abuse, anxiety, and depression who presents with complaints of significant abdominal pain with nausea and vomiting, unable to keep anything down over the last 2 weeks.  She reports having severe squeezing midline and lower abdominal pain with bladder spasms. Relates most of her symptoms are related to her history of interstitial cystitis and pancreatitis.  At baseline she in and out caths due to her inability to urinate.  Here lately she reports having worsening burning and discomfort with urinating. She is scheduled to undergo a cystoscopy with hydrodistention/Botox injection procedure with Dr. Amalia Hailey of urology on 10/21. She had gone to the ED 2 days ago and ultimately was discharged home with antiemetics including suppositories.  Despite trying to use antiemetics she still is unable to keep anything down.  Over the last 2 years patient reports that she had gone from 130 pounds down to as low as 70 pounds, but currently was sitting around 90 pounds. In the ED, VS fairly stable. Labs with lipase 53, PLT 439, UA positive for UTI. CT scan of the abdomen and pelvis negative for any acute intra-abdominal intrapelvic abnormality. Patient admitted for further management.    Today, had an extensive conversation with patient and mother, still with sig abdominal pain, nausea but no vomiting. Pt is able to keep clear liquids and her meds down, but reports intense spasm in her bladder. Pt very tearful about situation and insisting on G-tube. Plan for NG tube with flouro. Noted hypoglycemia overnight and early this am.    Assessment/Plan: Principal Problem:   UTI (urinary tract infection) Active Problems:   SMOKER   Bipolar  disorder (HCC)   BRONCHITIS, RECURRENT   Chronic interstitial cystitis   Thrombocytosis   Elevated lipase   Hypokalemia   Fibromyalgia   History of pancreatitis   Suspect urinary tract infection Hx of chronic interstitial cystitis Currently afebrile with no leukocytosis UA positive for small leukocytes, positive nitrites, many bacteria, 6-10 WBCs, UC pending Pt is scheduled to undergo a cystoscopy with hydrodistention/Botox injection procedure with Dr. Amalia Hailey of urology on 10/21, follow up recommended Continue IV rocephin pending culture Pain management  Nausea and vomiting ??Dysphagia, ??esophageal spasms ??Opioid withdrawal  Unable to keep food down, feeling of food stuck in her throat, reports spasms during swallowing CT scan of the abdomen pelvis did not note any acute abnormalities SLP to evaluate swallowing, may need MBS Vs GI consult for EGD Due to severe malnutrition, plan for NGT under flouro as Cortrak placement was unsuccessful Further work up for dysphagia before considering PEG tube (last resort and if indicated) Continue IVF, advance to CLD Monitor closely  Hypoglycemia Noted CBG as low as 43 Encourage CLD Allergy with D5/NS, but no allergy with ??D50 Continue IVF NS  Hx of chronic pancreatitis Minimally elevated lipase CT scan unremarkable Continue IV fluids   History of recurrent bronchitis On room air Brovana and budesonide nebs substitute for Symbicort Albuterol nebs as needed for shortness of breath/wheezing  L5 compression fracture Chronic in nature, low back pain Dose of gabapentin recommended to be adjusted down to 600 mg 3 times daily for kidney function by pharmacy, but patient declined and requested that it be prescribed 800 mg 4 times daily as prescribed  Bipolar disorder PTA meds: Klonopin 1 tablet 3 times daily, mirtazapine 15 mg nightly, Trintellix 10 mg daily, and Seroquel 400 mg nightly Followed by Dr. Erling Cruz of psychiatry at [facility]. Per  review of records she was last seen on 11/24/2020 with a note verifying mirtazapine and Klonopin, Trintellix 10 mg daily.   Continue Klonopin, mirtazapine,Trintellix, and Seroquel  Chronic pain/fibromyalgia Patient is followed in the outpatient setting by pain management PTA hydrocodone 5-325 mg 3 times daily as needed for pain, will restart once pain under control Continue outpatient follow-up with pain management   Tobacco abuse Advised to quit Nicotine patch   Underweight: BMI 15.1 kg/m Dietitian consulted- recommend feeding tube placement    Malnutrition Type:  Nutrition Problem: Severe Malnutrition Etiology: chronic illness (chronic nausea/vomiting)   Malnutrition Characteristics:  Signs/Symptoms: severe fat depletion, severe muscle depletion, energy intake < or equal to 75% for > or equal to 1 month   Nutrition Interventions:  Interventions: Tube feeding    Estimated body mass index is 15.36 kg/m as calculated from the following:   Height as of this encounter: 5\' 4"  (1.626 m).   Weight as of this encounter: 40.6 kg.     Code Status: Full  Family Communication: Mother at bedside  Disposition Plan: Status is: Inpatient  Remains inpatient appropriate because: Level of care    Consultants: None  Procedures: None  Antimicrobials: Rocephin  DVT prophylaxis: lovenox   Objective: Vitals:   01/22/21 0802 01/22/21 0803 01/22/21 0857 01/22/21 1145  BP: 111/73 105/77  112/84  Pulse: 78 89  84  Resp:      Temp: 98.4 F (36.9 C) 98.3 F (36.8 C)  98.4 F (36.9 C)  TempSrc: Oral   Oral  SpO2: 100% 99% 99% 98%  Weight:      Height:        Intake/Output Summary (Last 24 hours) at 01/22/2021 1440 Last data filed at 01/22/2021 1139 Gross per 24 hour  Intake 1794.45 ml  Output 1500 ml  Net 294.45 ml   Filed Weights   01/20/21 1845 01/21/21 0620 01/22/21 0452  Weight: 40.4 kg 39.9 kg 40.6 kg    Exam: General: NAD, thin Cardiovascular: S1,  S2 present Respiratory: CTAB Abdomen: Soft, +tender, nondistended, bowel sounds present Musculoskeletal: No bilateral pedal edema noted Skin: Normal Psychiatry: Teary mood     Data Reviewed: CBC: Recent Labs  Lab 01/19/21 0226 01/20/21 1942 01/22/21 0427  WBC 7.8 9.6 6.5  NEUTROABS  --  5.6  --   HGB 11.6* 12.9 12.0  HCT 34.4* 37.4 35.6*  MCV 96.1 93.3 93.9  PLT 352 439* 818   Basic Metabolic Panel: Recent Labs  Lab 01/19/21 0226 01/20/21 1942 01/21/21 1445 01/21/21 1822 01/22/21 0427  NA 135 137  --   --  139  K 3.1* 3.1*  --   --  4.0  CL 104 106  --   --  109  CO2 24 23  --   --  22  GLUCOSE 109* 107*  --   --  56*  BUN 8 6  --   --  <5*  CREATININE 0.73 0.63  --   --  0.72  CALCIUM 9.2 9.0  --   --  8.8*  MG  --   --  1.8 1.8 1.9  PHOS  --   --  3.0 2.8 3.4   GFR: Estimated Creatinine Clearance: 57.5 mL/min (by C-G formula based on SCr of 0.72 mg/dL). Liver Function Tests: Recent  Labs  Lab 01/19/21 0226 01/20/21 1942 01/22/21 0427  AST 22 19 17   ALT 40 26 21  ALKPHOS 69 79 56  BILITOT 0.5 0.3 0.8  PROT 7.0 7.5 6.1*  ALBUMIN 4.0 4.4 3.4*   Recent Labs  Lab 01/19/21 0226 01/20/21 1942  LIPASE 25 53*   No results for input(s): AMMONIA in the last 168 hours. Coagulation Profile: No results for input(s): INR, PROTIME in the last 168 hours. Cardiac Enzymes: No results for input(s): CKTOTAL, CKMB, CKMBINDEX, TROPONINI in the last 168 hours. BNP (last 3 results) No results for input(s): PROBNP in the last 8760 hours. HbA1C: No results for input(s): HGBA1C in the last 72 hours. CBG: Recent Labs  Lab 01/22/21 0020 01/22/21 0433 01/22/21 0520 01/22/21 1019 01/22/21 1247  GLUCAP 269* 56* 150* 88 90   Lipid Profile: No results for input(s): CHOL, HDL, LDLCALC, TRIG, CHOLHDL, LDLDIRECT in the last 72 hours. Thyroid Function Tests: No results for input(s): TSH, T4TOTAL, FREET4, T3FREE, THYROIDAB in the last 72 hours. Anemia Panel: No results  for input(s): VITAMINB12, FOLATE, FERRITIN, TIBC, IRON, RETICCTPCT in the last 72 hours. Urine analysis:    Component Value Date/Time   COLORURINE YELLOW 01/20/2021 1935   APPEARANCEUR CLOUDY (A) 01/20/2021 1935   LABSPEC 1.025 01/20/2021 1935   PHURINE 6.0 01/20/2021 1935   GLUCOSEU NEGATIVE 01/20/2021 1935   HGBUR NEGATIVE 01/20/2021 1935   BILIRUBINUR NEGATIVE 01/20/2021 1935   BILIRUBINUR positive 10/02/2019 1038   KETONESUR NEGATIVE 01/20/2021 1935   PROTEINUR NEGATIVE 01/20/2021 1935   UROBILINOGEN 0.2 10/02/2019 1038   UROBILINOGEN 0.2 06/23/2014 1300   NITRITE POSITIVE (A) 01/20/2021 1935   LEUKOCYTESUR SMALL (A) 01/20/2021 1935   Sepsis Labs: @LABRCNTIP (procalcitonin:4,lacticidven:4)  ) Recent Results (from the past 240 hour(s))  Resp Panel by RT-PCR (Flu A&B, Covid) Nasopharyngeal Swab     Status: None   Collection Time: 01/21/21  4:31 AM   Specimen: Nasopharyngeal Swab; Nasopharyngeal(NP) swabs in vial transport medium  Result Value Ref Range Status   SARS Coronavirus 2 by RT PCR NEGATIVE NEGATIVE Final    Comment: (NOTE) SARS-CoV-2 target nucleic acids are NOT DETECTED.  The SARS-CoV-2 RNA is generally detectable in upper respiratory specimens during the acute phase of infection. The lowest concentration of SARS-CoV-2 viral copies this assay can detect is 138 copies/mL. A negative result does not preclude SARS-Cov-2 infection and should not be used as the sole basis for treatment or other patient management decisions. A negative result may occur with  improper specimen collection/handling, submission of specimen other than nasopharyngeal swab, presence of viral mutation(s) within the areas targeted by this assay, and inadequate number of viral copies(<138 copies/mL). A negative result must be combined with clinical observations, patient history, and epidemiological information. The expected result is Negative.  Fact Sheet for Patients:   EntrepreneurPulse.com.au  Fact Sheet for Healthcare Providers:  IncredibleEmployment.be  This test is no t yet approved or cleared by the [place] FDA and  has been authorized for detection and/or diagnosis of SARS-CoV-2 by FDA under an Emergency Use Authorization (EUA). This EUA will remain  in effect (meaning this test can be used) for the duration of the COVID-19 declaration under Section 564(b)(1) of the Act, 21 U.S.C.section 360bbb-3(b)(1), unless the authorization is terminated  or revoked sooner.       Influenza A by PCR NEGATIVE NEGATIVE Final   Influenza B by PCR NEGATIVE NEGATIVE Final    Comment: (NOTE) The Xpert Xpress SARS-CoV-2/FLU/RSV plus assay is intended as  an aid in the diagnosis of influenza from Nasopharyngeal swab specimens and should not be used as a sole basis for treatment. Nasal washings and aspirates are unacceptable for Xpert Xpress SARS-CoV-2/FLU/RSV testing.  Fact Sheet for Patients: EntrepreneurPulse.com.au  Fact Sheet for Healthcare Providers: IncredibleEmployment.be  This test is not yet approved or cleared by the [place] FDA and has been authorized for detection and/or diagnosis of SARS-CoV-2 by FDA under an Emergency Use Authorization (EUA). This EUA will remain in effect (meaning this test can be used) for the duration of the COVID-19 declaration under Section 564(b)(1) of the Act, 21 U.S.C. section 360bbb-3(b)(1), unless the authorization is terminated or revoked.  Performed at [facility], [address] [place], [place] [address]   Urine Culture     Status: None (Preliminary result)   Collection Time: 01/21/21  9:45 AM   Specimen: Urine, Catheterized  Result Value Ref Range Status   Specimen Description URINE, CATHETERIZED  Final   Special Requests NONE  Final   Culture   Final    CULTURE REINCUBATED FOR BETTER GROWTH Performed at  [facility], [place] [address] [place], [place] [address]    Report Status PENDING  Incomplete      Studies: No results found.  Scheduled Meds:  arformoterol  15 mcg Nebulization BID   budesonide (PULMICORT) nebulizer solution  0.5 mg Nebulization BID   Chlorhexidine Gluconate Cloth  6 each Topical Daily   cycloSPORINE  1 drop Both Eyes BID   enoxaparin (LOVENOX) injection  30 mg Subcutaneous Q24H   estradiol  2 mg Oral Daily   fluticasone  2 spray Each Nare Daily   free water  100 mL Per Tube Q4H   gabapentin  800 mg Oral QID   guaiFENesin  600 mg Oral BID   hydrOXYzine  50 mg Oral QHS   mirtazapine  15 mg Oral QHS   oxybutynin  15 mg Oral Daily   pantoprazole (PROTONIX) IV  40 mg Intravenous Q12H   QUEtiapine  400 mg Oral QHS   sodium chloride flush  3 mL Intravenous Q12H   vortioxetine HBr  10 mg Oral Daily    Continuous Infusions:  sodium chloride     cefTRIAXone (ROCEPHIN)  IV 1 g (01/22/21 0949)   feeding supplement (OSMOLITE 1.2 CAL) Stopped (01/21/21 2018)   promethazine (PHENERGAN) injection (IM or IVPB) 12.5 mg (01/22/21 1115)     LOS: 1 day     Alma Friendly, MD [facility]  If 7PM-7AM, please contact night-coverage [email or web address] 01/22/2021, 2:40 PM

## 2021-01-22 NOTE — Progress Notes (Signed)
Hypoglycemic Event  CBG: 69  Treatment: D50 25 mL (12.5 gm)  Symptoms: None  Follow-up CBG: Time:15 CBG Result:173  Possible Reasons for Event: Inadequate meal intake  Comments/MD notified:No, per protocol     [name]

## 2021-01-22 NOTE — Progress Notes (Signed)
Pt returned to the unit by transport.

## 2021-01-22 NOTE — Progress Notes (Signed)
Pt started on Osmolite 1.2. Rate started at 25 ml/hr per order request.

## 2021-01-22 NOTE — Progress Notes (Signed)
Pt taken to radiology for NG tube placement.

## 2021-01-22 NOTE — Progress Notes (Signed)
Ok to use NG per MD.

## 2021-01-22 NOTE — Progress Notes (Signed)
Hypoglycemic Event  CBG: 56  Treatment: D50 25 mL (12.5 gm)  Symptoms: None  Follow-up CBG: Time: 0520 CBG Result:150  Possible Reasons for Event: NPO  Comments/MD notified:No    Suzan Garibaldi

## 2021-01-23 DIAGNOSIS — F319 Bipolar disorder, unspecified: Secondary | ICD-10-CM | POA: Diagnosis not present

## 2021-01-23 DIAGNOSIS — N3010 Interstitial cystitis (chronic) without hematuria: Secondary | ICD-10-CM | POA: Diagnosis not present

## 2021-01-23 DIAGNOSIS — R748 Abnormal levels of other serum enzymes: Secondary | ICD-10-CM | POA: Diagnosis not present

## 2021-01-23 DIAGNOSIS — N3000 Acute cystitis without hematuria: Secondary | ICD-10-CM | POA: Diagnosis not present

## 2021-01-23 LAB — GLUCOSE, CAPILLARY
Glucose-Capillary: 100 mg/dL — ABNORMAL HIGH (ref 70–99)
Glucose-Capillary: 114 mg/dL — ABNORMAL HIGH (ref 70–99)
Glucose-Capillary: 115 mg/dL — ABNORMAL HIGH (ref 70–99)
Glucose-Capillary: 122 mg/dL — ABNORMAL HIGH (ref 70–99)
Glucose-Capillary: 96 mg/dL (ref 70–99)

## 2021-01-23 LAB — CBC WITH DIFFERENTIAL/PLATELET
Abs Immature Granulocytes: 0.03 10*3/uL (ref 0.00–0.07)
Basophils Absolute: 0 10*3/uL (ref 0.0–0.1)
Basophils Relative: 0 %
Eosinophils Absolute: 0 10*3/uL (ref 0.0–0.5)
Eosinophils Relative: 0 %
HCT: 43.7 % (ref 36.0–46.0)
Hemoglobin: 13.7 g/dL (ref 12.0–15.0)
Immature Granulocytes: 0 %
Lymphocytes Relative: 20 %
Lymphs Abs: 2.1 10*3/uL (ref 0.7–4.0)
MCH: 28.9 pg (ref 26.0–34.0)
MCHC: 31.4 g/dL (ref 30.0–36.0)
MCV: 92.2 fL (ref 80.0–100.0)
Monocytes Absolute: 0.7 10*3/uL (ref 0.1–1.0)
Monocytes Relative: 7 %
Neutro Abs: 7.6 10*3/uL (ref 1.7–7.7)
Neutrophils Relative %: 73 %
Platelets: 208 10*3/uL (ref 150–400)
RBC: 4.74 MIL/uL (ref 3.87–5.11)
RDW: 14.6 % (ref 11.5–15.5)
WBC: 10.4 10*3/uL (ref 4.0–10.5)
nRBC: 0 % (ref 0.0–0.2)

## 2021-01-23 LAB — COMPREHENSIVE METABOLIC PANEL
ALT: 21 U/L (ref 0–44)
AST: 18 U/L (ref 15–41)
Albumin: 3.3 g/dL — ABNORMAL LOW (ref 3.5–5.0)
Alkaline Phosphatase: 92 U/L (ref 38–126)
Anion gap: 8 (ref 5–15)
BUN: 15 mg/dL (ref 6–20)
CO2: 33 mmol/L — ABNORMAL HIGH (ref 22–32)
Calcium: 9 mg/dL (ref 8.9–10.3)
Chloride: 100 mmol/L (ref 98–111)
Creatinine, Ser: 0.57 mg/dL (ref 0.44–1.00)
GFR, Estimated: >60 mL/min (ref >60)
Glucose, Bld: 113 mg/dL — ABNORMAL HIGH (ref 70–99)
Potassium: 3.9 mmol/L (ref 3.5–5.1)
Sodium: 141 mmol/L (ref 135–145)
Total Bilirubin: 0.4 mg/dL (ref 0.3–1.2)
Total Protein: 6.1 g/dL — ABNORMAL LOW (ref 6.5–8.1)

## 2021-01-23 LAB — PHOSPHORUS: Phosphorus: 4.4 mg/dL (ref 2.5–4.6)

## 2021-01-23 LAB — LIPASE, BLOOD: Lipase: 24 U/L (ref 11–51)

## 2021-01-23 LAB — MAGNESIUM: Magnesium: 2.1 mg/dL (ref 1.7–2.4)

## 2021-01-23 MED ORDER — NEOMYCIN-POLYMYXIN-HC 1 % OT SOLN
3.0000 [drp] | Freq: Three times a day (TID) | OTIC | Status: AC
  Administered 2021-01-23 – 2021-01-24 (×2): 3 [drp] via OTIC
  Filled 2021-01-23 (×2): qty 10

## 2021-01-23 NOTE — Progress Notes (Signed)
PROGRESS NOTE  [name] CXK:[id number] DOB: 1975/05/18 DOA: 01/20/2021 PCP: Neale Burly, MD  HPI/Recap of past 24 hours: [name] is a 44 y.o. female with medical history significant of HTN, HLD, chronic interstitial cystitis, COPD, pancreatitis, bipolar disorder, tobacco abuse, anxiety, and depression who presents with complaints of significant abdominal pain with nausea and vomiting, unable to keep anything down over the last 2 weeks.  She reports having severe squeezing midline and lower abdominal pain with bladder spasms. Relates most of her symptoms are related to her history of interstitial cystitis and pancreatitis.  At baseline she in and out caths due to her inability to urinate.  Here lately she reports having worsening burning and discomfort with urinating. She is scheduled to undergo a cystoscopy with hydrodistention/Botox injection procedure with Dr. Amalia Hailey of urology on 10/21. She had gone to the ED 2 days ago and ultimately was discharged home with antiemetics including suppositories.  Despite trying to use antiemetics she still is unable to keep anything down.  Over the last 2 years patient reports that she had gone from 130 pounds down to as low as 70 pounds, but currently was sitting around 90 pounds. In the ED, VS fairly stable. Labs with lipase 53, PLT 439, UA positive for UTI. CT scan of the abdomen and pelvis negative for any acute intra-abdominal intrapelvic abnormality. Patient admitted for further management.    Today, pt tolerating NGT so far, no abd distension, N/V. C/o L sided neck pain as well as L ear pain, noted to be tearful. No fever/chills noted. Denies any headcahe.   Assessment/Plan: Principal Problem:   UTI (urinary tract infection) Active Problems:   SMOKER   Bipolar disorder (HCC)   BRONCHITIS, RECURRENT   Chronic interstitial cystitis   Thrombocytosis   Elevated lipase   Hypokalemia   Fibromyalgia   History of pancreatitis   Suspect  urinary tract infection Hx of chronic interstitial cystitis Currently afebrile with no leukocytosis UA positive for small leukocytes, positive nitrites, many bacteria, 6-10 WBCs UC >100,000 gram neg rods Pt is scheduled to undergo a cystoscopy with hydrodistention/Botox injection procedure with Dr. Amalia Hailey of urology on 10/21, follow up recommended Continue IV rocephin pending culture Pain management  Nausea and vomiting ??Dysphagia, ??esophageal spasms ??Opioid withdrawal  Unable to keep food down, feeling of food stuck in her throat, reports spasms during swallowing CT scan of the abdomen pelvis did not note any acute abnormalities SLP to evaluate swallowing, may need MBS Vs GI consult for EGD Due to severe malnutrition, NGT placed under flouro on 01/22/21, as Cortrak placement was unsuccessful Further work up for dysphagia before considering PEG tube (last resort and if indicated) Continue tube feeding, with free water flushes, continue CLD as tolerated Monitor closely  Hypoglycemia Improved Noted CBG as low as 43 on 10/13 Tube feedings, CLD D50 prn  L sided neck pain L earache DG neck soft tissue Ear drop for 1 day  Hx of chronic pancreatitis Minimally elevated lipase, trended back to WNL CT scan unremarkable   History of recurrent bronchitis On room air Brovana and budesonide nebs substitute for Symbicort Albuterol nebs as needed for shortness of breath/wheezing  L5 compression fracture Chronic in nature, low back pain Dose of gabapentin recommended to be adjusted down to 600 mg 3 times daily for kidney function by pharmacy, but patient declined and requested that it be prescribed 800 mg 4 times daily as prescribed   Bipolar disorder PTA meds: Klonopin 1  tablet 3 times daily, mirtazapine 15 mg nightly, Trintellix 10 mg daily, and Seroquel 400 mg nightly Followed by Dr. Erling Cruz of psychiatry at [facility]. Per review of records she was last seen on 11/24/2020 with a note  verifying mirtazapine and Klonopin, Trintellix 10 mg daily.   Continue Klonopin, mirtazapine,Trintellix, and Seroquel  Chronic pain/fibromyalgia Patient is followed in the outpatient setting by pain management PTA hydrocodone 5-325 mg 3 times daily as needed for pain, will restart once pain under control Continue outpatient follow-up with pain management   Tobacco abuse Advised to quit Nicotine patch   Underweight: BMI 15.1 kg/m Dietitian consulted- recommend feeding tube placement    Malnutrition Type:  Nutrition Problem: Severe Malnutrition Etiology: chronic illness (chronic nausea/vomiting)   Malnutrition Characteristics:  Signs/Symptoms: severe fat depletion, severe muscle depletion, energy intake < or equal to 75% for > or equal to 1 month   Nutrition Interventions:  Interventions: Tube feeding    Estimated body mass index is 15.59 kg/m as calculated from the following:   Height as of this encounter: 5\' 4"  (1.626 m).   Weight as of this encounter: 41.2 kg.     Code Status: Full  Family Communication: Daughter at bedside  Disposition Plan: Status is: Inpatient  Remains inpatient appropriate because: Level of care    Consultants: None  Procedures: None  Antimicrobials: Rocephin  DVT prophylaxis: lovenox   Objective: Vitals:   01/23/21 0543 01/23/21 0724 01/23/21 0737 01/23/21 1111  BP: 122/68 104/76  99/79  Pulse: 73 67  84  Resp: 14   16  Temp: 98 F (36.7 C)   98 F (36.7 C)  TempSrc: Oral   Oral  SpO2: 100% 100% 100% 100%  Weight: 41.2 kg     Height:        Intake/Output Summary (Last 24 hours) at 01/23/2021 1539 Last data filed at 01/23/2021 1413 Gross per 24 hour  Intake 3548.08 ml  Output 2000 ml  Net 1548.08 ml   Filed Weights   01/21/21 0620 01/22/21 0452 01/23/21 0543  Weight: 39.9 kg 40.6 kg 41.2 kg    Exam: General: NAD, thin Cardiovascular: S1, S2 present Respiratory: CTAB Abdomen: Soft, +tender,  nondistended, bowel sounds present Musculoskeletal: No bilateral pedal edema noted Skin: Normal Psychiatry: Teary mood     Data Reviewed: CBC: Recent Labs  Lab 01/19/21 0226 01/20/21 1942 01/22/21 0427 01/23/21 0403  WBC 7.8 9.6 6.5 10.4  NEUTROABS  --  5.6  --  7.6  HGB 11.6* 12.9 12.0 13.7  HCT 34.4* 37.4 35.6* 43.7  MCV 96.1 93.3 93.9 92.2  PLT 352 439* 303 102   Basic Metabolic Panel: Recent Labs  Lab 01/19/21 0226 01/20/21 1942 01/21/21 1445 01/21/21 1822 01/22/21 0427 01/23/21 0403  NA 135 137  --   --  139 141  K 3.1* 3.1*  --   --  4.0 3.9  CL 104 106  --   --  109 100  CO2 24 23  --   --  22 33*  GLUCOSE 109* 107*  --   --  56* 113*  BUN 8 6  --   --  <5* 15  CREATININE 0.73 0.63  --   --  0.72 0.57  CALCIUM 9.2 9.0  --   --  8.8* 9.0  MG  --   --  1.8 1.8 1.9 2.1  PHOS  --   --  3.0 2.8 3.4 4.4   GFR: Estimated Creatinine Clearance: 58.4 mL/min (by  C-G formula based on SCr of 0.57 mg/dL). Liver Function Tests: Recent Labs  Lab 01/19/21 0226 01/20/21 1942 01/22/21 0427 01/23/21 0403  AST 22 19 17 18   ALT 40 26 21 21   ALKPHOS 69 79 56 92  BILITOT 0.5 0.3 0.8 0.4  PROT 7.0 7.5 6.1* 6.1*  ALBUMIN 4.0 4.4 3.4* 3.3*   Recent Labs  Lab 01/19/21 0226 01/20/21 1942 01/23/21 0403  LIPASE 25 53* 24   No results for input(s): AMMONIA in the last 168 hours. Coagulation Profile: No results for input(s): INR, PROTIME in the last 168 hours. Cardiac Enzymes: No results for input(s): CKTOTAL, CKMB, CKMBINDEX, TROPONINI in the last 168 hours. BNP (last 3 results) No results for input(s): PROBNP in the last 8760 hours. HbA1C: No results for input(s): HGBA1C in the last 72 hours. CBG: Recent Labs  Lab 01/22/21 2015 01/23/21 0009 01/23/21 0412 01/23/21 0754 01/23/21 1113  GLUCAP 110* 100* 115* 114* 96   Lipid Profile: No results for input(s): CHOL, HDL, LDLCALC, TRIG, CHOLHDL, LDLDIRECT in the last 72 hours. Thyroid Function Tests: No results  for input(s): TSH, T4TOTAL, FREET4, T3FREE, THYROIDAB in the last 72 hours. Anemia Panel: No results for input(s): VITAMINB12, FOLATE, FERRITIN, TIBC, IRON, RETICCTPCT in the last 72 hours. Urine analysis:    Component Value Date/Time   COLORURINE YELLOW 01/20/2021 1935   APPEARANCEUR CLOUDY (A) 01/20/2021 1935   LABSPEC 1.025 01/20/2021 1935   PHURINE 6.0 01/20/2021 1935   GLUCOSEU NEGATIVE 01/20/2021 1935   HGBUR NEGATIVE 01/20/2021 1935   BILIRUBINUR NEGATIVE 01/20/2021 1935   BILIRUBINUR positive 10/02/2019 1038   KETONESUR NEGATIVE 01/20/2021 1935   PROTEINUR NEGATIVE 01/20/2021 1935   UROBILINOGEN 0.2 10/02/2019 1038   UROBILINOGEN 0.2 06/23/2014 1300   NITRITE POSITIVE (A) 01/20/2021 1935   LEUKOCYTESUR SMALL (A) 01/20/2021 1935   Sepsis Labs: @LABRCNTIP (procalcitonin:4,lacticidven:4)  ) Recent Results (from the past 240 hour(s))  Resp Panel by RT-PCR (Flu A&B, Covid) Nasopharyngeal Swab     Status: None   Collection Time: 01/21/21  4:31 AM   Specimen: Nasopharyngeal Swab; Nasopharyngeal(NP) swabs in vial transport medium  Result Value Ref Range Status   SARS Coronavirus 2 by RT PCR NEGATIVE NEGATIVE Final    Comment: (NOTE) SARS-CoV-2 target nucleic acids are NOT DETECTED.  The SARS-CoV-2 RNA is generally detectable in upper respiratory specimens during the acute phase of infection. The lowest concentration of SARS-CoV-2 viral copies this assay can detect is 138 copies/mL. A negative result does not preclude SARS-Cov-2 infection and should not be used as the sole basis for treatment or other patient management decisions. A negative result may occur with  improper specimen collection/handling, submission of specimen other than nasopharyngeal swab, presence of viral mutation(s) within the areas targeted by this assay, and inadequate number of viral copies(<138 copies/mL). A negative result must be combined with clinical observations, patient history, and  epidemiological information. The expected result is Negative.  Fact Sheet for Patients:  EntrepreneurPulse.com.au  Fact Sheet for Healthcare Providers:  IncredibleEmployment.be  This test is no t yet approved or cleared by the [place] FDA and  has been authorized for detection and/or diagnosis of SARS-CoV-2 by FDA under an Emergency Use Authorization (EUA). This EUA will remain  in effect (meaning this test can be used) for the duration of the COVID-19 declaration under Section 564(b)(1) of the Act, 21 U.S.C.section 360bbb-3(b)(1), unless the authorization is terminated  or revoked sooner.       Influenza A by PCR NEGATIVE NEGATIVE Final  Influenza B by PCR NEGATIVE NEGATIVE Final    Comment: (NOTE) The Xpert Xpress SARS-CoV-2/FLU/RSV plus assay is intended as an aid in the diagnosis of influenza from Nasopharyngeal swab specimens and should not be used as a sole basis for treatment. Nasal washings and aspirates are unacceptable for Xpert Xpress SARS-CoV-2/FLU/RSV testing.  Fact Sheet for Patients: EntrepreneurPulse.com.au  Fact Sheet for Healthcare Providers: IncredibleEmployment.be  This test is not yet approved or cleared by the [place] FDA and has been authorized for detection and/or diagnosis of SARS-CoV-2 by FDA under an Emergency Use Authorization (EUA). This EUA will remain in effect (meaning this test can be used) for the duration of the COVID-19 declaration under Section 564(b)(1) of the Act, 21 U.S.C. section 360bbb-3(b)(1), unless the authorization is terminated or revoked.  Performed at [facility], [address] [place], [place] [address]   Urine Culture     Status: Abnormal (Preliminary result)   Collection Time: 01/21/21  9:45 AM   Specimen: Urine, Catheterized  Result Value Ref Range Status   Specimen Description URINE, CATHETERIZED  Final   Special  Requests NONE  Final   Culture (A)  Final    >=100,000 COLONIES/mL GRAM NEGATIVE RODS IDENTIFICATION AND SUSCEPTIBILITIES TO FOLLOW Performed at [facility], [address] [place], [place] [address]    Report Status PENDING  Incomplete      Studies: DG Naso G Tube Plc W/Fl W/Rad  Result Date: 01/22/2021 CLINICAL DATA:  NG tube placement EXAM: NASO G TUBE PLACEMENT WITH FL AND WITH RAD FLUOROSCOPY TIME:  Fluoroscopy Time:  36 seconds Radiation Exposure Index (if provided by the fluoroscopic device): 1 mGy COMPARISON:  None. FINDINGS: Multiple attempts were made to place the enteric tube in a post pyloric position. However, the tube could not be directed toward the right. Ultimately, the tube was left within the stomach directed toward the left. IMPRESSION: Fluoroscopic guided placement of enteric tube within the stomach. Post pyloric positioning could not be obtained. Electronically Signed   By: Macy Mis M.D.   On: 01/22/2021 17:25    Scheduled Meds:  arformoterol  15 mcg Nebulization BID   budesonide (PULMICORT) nebulizer solution  0.5 mg Nebulization BID   Chlorhexidine Gluconate Cloth  6 each Topical Daily   cycloSPORINE  1 drop Both Eyes BID   enoxaparin (LOVENOX) injection  30 mg Subcutaneous Q24H   estradiol  2 mg Oral Daily   fluticasone  2 spray Each Nare Daily   free water  100 mL Per Tube Q4H   gabapentin  800 mg Oral QID   guaiFENesin  600 mg Oral BID   hydrOXYzine  50 mg Oral QHS   mirtazapine  15 mg Oral QHS   nicotine  21 mg Transdermal Daily   oxybutynin  15 mg Oral Daily   pantoprazole (PROTONIX) IV  40 mg Intravenous Q12H   QUEtiapine  400 mg Oral QHS   sodium chloride flush  3 mL Intravenous Q12H   vortioxetine HBr  10 mg Oral Daily    Continuous Infusions:  sodium chloride 75 mL/hr at 01/23/21 0426   cefTRIAXone (ROCEPHIN)  IV 1 g (01/23/21 0947)   feeding supplement (OSMOLITE 1.2 CAL) 35 mL/hr at 01/23/21 8177   promethazine (PHENERGAN)  injection (IM or IVPB) 12.5 mg (01/23/21 1046)     LOS: 2 days     Alma Friendly, MD [facility]  If 7PM-7AM, please contact night-coverage [email or web address] 01/23/2021, 3:39 PM

## 2021-01-23 NOTE — Evaluation (Signed)
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 Melvyn Novas.....-HFA 75% 12/04/2008>75% 02/05/2009>75% 08/04/2009 -PFT's 01/04/2009 2.56 (86%) ratio 75, no resp to B2 and DLC0 67% > 80 after correction    Cancer (HCC)    cervical cancer   Chronic interstitial cystitis 03/11/2009   Chronic nausea    Chronic pain    Colon polyps    COLONIC POLYPS, HX OF 07/25/2006   ADENOMATOUS POLYP   COPD (chronic obstructive pulmonary disease) ([place])    DEPRESSION 08/31/2007   Endometriosis    FIBROMYALGIA 08/31/2007   Fibromyalgia    GERD 02/05/2009   Hyperlipidemia    HYPERTENSION 08/31/2007   IBS 03/11/2009   Internal hemorrhoids    Migraine headache    NEPHROLITHIASIS 08/31/2007   Pancreatitis    PONV (postoperative nausea and vomiting)    RECTAL BLEEDING 03/11/2009   Sciatica    right leg   Seizures ([place])    been about 1 year since last seisure per pt   SLEEP APNEA 08/31/2007   Substance abuse ([place])    Thyroid disease    Uterine cyst    Past Surgical History:  Past Surgical History:  Procedure Laterality Date   ABDOMINAL HYSTERECTOMY     BIOPSY  10/25/2018   Procedure: BIOPSY;  Surgeon: Rogene Houston, MD;  Location: [facility];  Service: Endoscopy;;  gastric   BIOPSY  06/11/2020   Procedure: BIOPSY;  Surgeon: Montez Morita, Quillian Quince, MD;   Location: [facility];  Service: Gastroenterology;;  small bowel   bladder stretching x6     BLADDER SURGERY     stimulator placed and stretching    CHOLECYSTECTOMY     COLONOSCOPY     COLONOSCOPY WITH PROPOFOL N/A 09/03/2014   Procedure: COLONOSCOPY WITH PROPOFOL;  Surgeon: Milus Banister, MD;  Location: [facility];  Service: Endoscopy;  Laterality: N/A;   ESOPHAGOGASTRODUODENOSCOPY (EGD) WITH PROPOFOL N/A 10/25/2018   Procedure: ESOPHAGOGASTRODUODENOSCOPY (EGD) WITH PROPOFOL;  Surgeon: Rogene Houston, MD;  [facility]: [facility];  Service: Endoscopy;  Laterality: N/A;  11:15   ESOPHAGOGASTRODUODENOSCOPY (EGD) WITH PROPOFOL N/A 06/11/2020   Procedure: ESOPHAGOGASTRODUODENOSCOPY (EGD) WITH PROPOFOL;  Surgeon: Harvel Quale, MD;  Location: [facility];  Service: Gastroenterology;  Laterality: N/A;   interstitial cystitis     PACEMAKER INSERTION     in hip for interstitial cystitis   pacemaker removal     removal of uterine cyst and scrapped uterus     replaced bladder pacemaker     HPI:  [name] is a 45 y.o. female who presented with complaints abdominal pain with nausea and vomiting over the last 2 weeks.  Pt has had significant weight loss.  She reports difficulty going back years with worsening in past 2 months resulting in inability to eat any  solid foods.  Lower lungs clear on Ab CT 10/13.  Pt has been seen by GI in the past and has had multiple normal EGDs.  Pt reports most recent BM 5 days ago.  She states she does go multiple days without BM at times, but that this is not normal for her.  She reports hx of IBS.  Pt with medical history significant of hypertension, hyperlipidemia, chronic interstitial cystitis, COPD, pancreatitis, bipolar disorder, tobacco abuse, anxiety, and depression.    Assessment / Plan / Recommendation  Clinical Impression  Pt presents with clinical indicators of pharyngoesophageal dysphagia.  Pt required multiple swallows with all bolus  trials.  Today pt consumed thin liquid by straw and puree.  Pt declined solid PO trials.  Pt exhibited good tolerance of puree and thin liquid with adequate oral clearance.  Pt required 2-3 swallows with each bolus trial.  Pt denied stasis of puree, but did feel she had to swallow multiple times to get it to go down.  She reports feeling of stasis of POs and indicates retrosternal area.  She describes pain along midline with swallowing.  She has to take dilaudid following solids POs given severity of pain.  Pt has seen GI in the past.  Past EGDs normal per chart review.  She has had multiple colonoscopies as well, with one scheduled in near future.  She reports IBS. Pt has lost a significant amount of weight.  Pt has been vomiting with all PO intake. She can sometimes push food down with liquid wash.  She at times has to make herself vomit to relieve stasis including with pills. Pt has NG at present for nutrition; she had wanted to avoid this in the past, but feels she is unable to meet her nutritional needs orally and she is tired of throwing up.  She is agreeable to placement of GJ tube to improve nutrition, but would like to be able to continue PO intake, if possible.  Pt is understandably worn out from her struggles to eat and this is an emotional topic for her.  It does not appear pt has had esophagram or gastric emptying study in the past.  Pt may benefit from fluoroscopic evaluation to investigate possible dysmotility.  Additionally, given pt's multiple swallows with puree and liquid, there is concern for decreased esophageal opening and possible retention of pharyngeal residue which could impact airway protection.  Recommned MBSS for further evaluation of pharyngeal swallow function.     Pt is agreeable to trying some softer foods, like mashed potatoes.  Recommend pureed diet with thin liquids.  MBSS planned for next date.  SLP Visit Diagnosis: Dysphagia, pharyngoesophageal phase (R13.14)    Aspiration  Risk  Mild aspiration risk    Diet Recommendation Dysphagia 1 (Puree);Thin liquid   Liquid Administration via: Cup;Straw Medication Administration: Crushed with puree Supervision: Patient able to self feed Compensations: Slow rate;Small sips/bites Postural Changes: Seated upright at 90 degrees    Other  Recommendations Oral Care Recommendations: Oral care BID    Recommendations for follow up therapy are one component of a multi-disciplinary discharge planning process, led by the attending physician.  Recommendations may be updated based on patient status, additional functional criteria and insurance authorization.  Follow up Recommendations  (TBD)      Frequency and Duration min 2x/week  2 weeks       Prognosis Prognosis for Safe Diet Advancement:  (TBD)      Swallow Study   General Date of Onset: 01/20/21  HPI: [name] is a 45 y.o. female who presented with complaints abdominal pain with nausea and vomiting over the last 2 weeks.  Pt has had significant weight loss.  She reports difficulty going back years with worsening in past 2 months resulting in inability to eat any solid foods.  Lower lungs clear on Ab CT 10/13.  Pt has been seen by GI in the past and has had multiple normal EGDs.  Pt reports most recent BM 5 days ago.  She states she does go multiple days without BM at times, but that this is not normal for her.  She reports hx of IBS.  Pt with medical history significant of hypertension, hyperlipidemia, chronic interstitial cystitis, COPD, pancreatitis, bipolar disorder, tobacco abuse, anxiety, and depression. Type of Study: Bedside Swallow Evaluation Previous Swallow Assessment: None Diet Prior to this Study: Thin liquids Temperature Spikes Noted: No Respiratory Status: Nasal cannula History of Recent Intubation: No Behavior/Cognition: Alert;Cooperative;Pleasant mood Oral Cavity Assessment: Within Functional Limits Oral Care Completed by SLP: No Oral Cavity -  Dentition: Dentures, top;Dentures, bottom Patient Positioning: Upright in bed Baseline Vocal Quality: Normal Volitional Cough: Strong Volitional Swallow: Able to elicit (effortful)    Oral/Motor/Sensory Function Overall Oral Motor/Sensory Function: Mild impairment Facial Symmetry: Within Functional Limits Lingual ROM: Within Functional Limits Lingual Symmetry: Within Functional Limits Lingual Strength: Reduced Velum: Within Functional Limits Mandible: Within Functional Limits   Ice Chips Ice chips: Not tested   Thin Liquid Thin Liquid: Impaired Presentation: Straw Pharyngeal  Phase Impairments: Multiple swallows    Nectar Thick Nectar Thick Liquid: Not tested   Honey Thick Honey Thick Liquid: Not tested   Puree Puree: Impaired Presentation: Spoon Pharyngeal Phase Impairments: Multiple swallows   Solid     Solid: Not tested (declined)      Celedonio Savage, MA, [place] Office: [phone]; Pager (10/16): [phone] 01/23/2021,1:38 PM

## 2021-01-24 ENCOUNTER — Inpatient Hospital Stay (HOSPITAL_COMMUNITY): Payer: BC Managed Care – PPO

## 2021-01-24 ENCOUNTER — Encounter (HOSPITAL_COMMUNITY): Payer: Self-pay | Admitting: Internal Medicine

## 2021-01-24 DIAGNOSIS — M797 Fibromyalgia: Secondary | ICD-10-CM | POA: Diagnosis not present

## 2021-01-24 DIAGNOSIS — N3000 Acute cystitis without hematuria: Secondary | ICD-10-CM | POA: Diagnosis not present

## 2021-01-24 DIAGNOSIS — F5089 Other specified eating disorder: Secondary | ICD-10-CM | POA: Diagnosis not present

## 2021-01-24 DIAGNOSIS — R1310 Dysphagia, unspecified: Secondary | ICD-10-CM | POA: Diagnosis not present

## 2021-01-24 DIAGNOSIS — N390 Urinary tract infection, site not specified: Secondary | ICD-10-CM | POA: Diagnosis not present

## 2021-01-24 LAB — CBC WITH DIFFERENTIAL/PLATELET
Abs Immature Granulocytes: 0.01 10*3/uL (ref 0.00–0.07)
Basophils Absolute: 0.1 10*3/uL (ref 0.0–0.1)
Basophils Relative: 1 %
Eosinophils Absolute: 0.3 10*3/uL (ref 0.0–0.5)
Eosinophils Relative: 4 %
HCT: 35.9 % — ABNORMAL LOW (ref 36.0–46.0)
Hemoglobin: 12.1 g/dL (ref 12.0–15.0)
Immature Granulocytes: 0 %
Lymphocytes Relative: 45 %
Lymphs Abs: 3.1 10*3/uL (ref 0.7–4.0)
MCH: 32 pg (ref 26.0–34.0)
MCHC: 33.7 g/dL (ref 30.0–36.0)
MCV: 95 fL (ref 80.0–100.0)
Monocytes Absolute: 0.5 10*3/uL (ref 0.1–1.0)
Monocytes Relative: 7 %
Neutro Abs: 2.9 10*3/uL (ref 1.7–7.7)
Neutrophils Relative %: 43 %
Platelets: 323 10*3/uL (ref 150–400)
RBC: 3.78 MIL/uL — ABNORMAL LOW (ref 3.87–5.11)
RDW: 12.8 % (ref 11.5–15.5)
WBC: 6.8 10*3/uL (ref 4.0–10.5)
nRBC: 0 % (ref 0.0–0.2)

## 2021-01-24 LAB — GLUCOSE, CAPILLARY
Glucose-Capillary: 102 mg/dL — ABNORMAL HIGH (ref 70–99)
Glucose-Capillary: 119 mg/dL — ABNORMAL HIGH (ref 70–99)
Glucose-Capillary: 90 mg/dL (ref 70–99)
Glucose-Capillary: 94 mg/dL (ref 70–99)
Glucose-Capillary: 94 mg/dL (ref 70–99)

## 2021-01-24 LAB — COMPREHENSIVE METABOLIC PANEL
ALT: 15 U/L (ref 0–44)
AST: 16 U/L (ref 15–41)
Albumin: 3.5 g/dL (ref 3.5–5.0)
Alkaline Phosphatase: 65 U/L (ref 38–126)
Anion gap: 7 (ref 5–15)
BUN: 5 mg/dL — ABNORMAL LOW (ref 6–20)
CO2: 26 mmol/L (ref 22–32)
Calcium: 9.7 mg/dL (ref 8.9–10.3)
Chloride: 106 mmol/L (ref 98–111)
Creatinine, Ser: 0.7 mg/dL (ref 0.44–1.00)
GFR, Estimated: >60 mL/min (ref >60)
Glucose, Bld: 108 mg/dL — ABNORMAL HIGH (ref 70–99)
Potassium: 4.1 mmol/L (ref 3.5–5.1)
Sodium: 139 mmol/L (ref 135–145)
Total Bilirubin: 0.3 mg/dL (ref 0.3–1.2)
Total Protein: 6.3 g/dL — ABNORMAL LOW (ref 6.5–8.1)

## 2021-01-24 LAB — URINE CULTURE: Culture: >=100000 — AB

## 2021-01-24 LAB — PREALBUMIN: Prealbumin: 20.8 mg/dL (ref 18–38)

## 2021-01-24 MED ORDER — NEOMYCIN-POLYMYXIN-HC 1 % OT SOLN
3.0000 [drp] | Freq: Three times a day (TID) | OTIC | Status: DC
  Filled 2021-01-24 (×2): qty 10

## 2021-01-24 MED ORDER — HYDROMORPHONE HCL 1 MG/ML IJ SOLN
0.5000 mg | INTRAMUSCULAR | Status: DC | PRN
  Administered 2021-01-24 – 2021-01-27 (×15): 0.5 mg via INTRAVENOUS
  Filled 2021-01-24 (×17): qty 0.5

## 2021-01-24 MED ORDER — NEOMYCIN-POLYMYXIN-HC 3.5-10000-1 OT SUSP
3.0000 [drp] | Freq: Three times a day (TID) | OTIC | Status: AC
  Administered 2021-01-24 – 2021-01-29 (×15): 3 [drp] via OTIC
  Filled 2021-01-24: qty 10

## 2021-01-24 MED ORDER — NEOMYCIN-POLYMYXIN-HC 3.5-10000-1 OT SUSP: 3.0000 [drp] | Freq: Three times a day (TID) | OTIC | Status: DC

## 2021-01-24 MED ORDER — ALUM & MAG HYDROXIDE-SIMETH 200-200-20 MG/5ML PO SUSP: 30.0000 mL | ORAL | Status: DC | PRN

## 2021-01-24 NOTE — Progress Notes (Signed)
PROGRESS NOTE  [name] TKW:[id number] DOB: September 24, 1975 DOA: 01/20/2021 PCP: Neale Burly, MD  HPI/Recap of past 24 hours: [name] is a 45 y.o. female with medical history significant of HTN, HLD, chronic interstitial cystitis, COPD, pancreatitis, bipolar disorder, tobacco abuse, anxiety, and depression who presents with complaints of significant abdominal pain with nausea and vomiting, unable to keep anything down over the last 2 weeks + (history is difficult as husband says she does eat but then patient gets upset and says she does not).  She reports having severe squeezing midline and lower abdominal pain with bladder spasms. Relates most of her symptoms are related to her history of interstitial cystitis and pancreatitis.  At baseline she in and out caths due to her inability to urinate.  Here lately she reports having worsening burning and discomfort with urinating. She is scheduled to undergo a cystoscopy with hydrodistention/Botox injection procedure with Dr. Amalia Hailey of urology on 10/21. She had gone to the ED 2 days ago and ultimately was discharged home with antiemetics including suppositories.  Despite trying to use antiemetics she still is unable to keep anything down.  Over the last 2 years patient reports that she had gone from 130 pounds down to as low as 70 pounds, but currently was sitting around 90 pounds.  CT scan of the abdomen and pelvis negative for any acute intra-abdominal intrapelvic abnormality. NG tube placed for nutrition.  GI consulted for ? Etiology of n/v.    Crying due to pain on left ear and asking for dilaudid-- story from patient and significant other differ in regards to what patient is eating.   Assessment/Plan: Principal Problem:   UTI (urinary tract infection) Active Problems:   SMOKER   Bipolar disorder (HCC)   BRONCHITIS, RECURRENT   Chronic interstitial cystitis   Thrombocytosis   Elevated lipase   Hypokalemia   Fibromyalgia   History of  pancreatitis   Suspect urinary tract infection Hx of chronic interstitial cystitis Currently afebrile with no leukocytosis UA positive for small leukocytes, positive nitrites, many bacteria, 6-10 WBCs UC >100,000 gram neg rods Pt is scheduled to undergo a cystoscopy with hydrodistention/Botox injection procedure with Dr. Amalia Hailey of urology on 10/21, follow up recommended Culture shows e coli- on abx   Nausea and vomiting/Dysphagia: ??esophageal spasms Unable to keep food down, feeling of food stuck in her throat, reports spasms during swallowing CT scan of the abdomen pelvis did not note any acute abnormalities -GI consult as patient asking for feeding tube w/o diagnosis -tolerating tube feeds -may need to remove tube feeds and do DG esophagus  Hypoglycemia Improved Noted CBG as low as 43 on 10/13 Tube feedings, CLD D50 prn  L sided neck pain L earache -IV pain meds -may need to remove tube and see if she improves -swallowing fine when distracted, when asked about swallowing says she is having to spit out her saliva   Hx of chronic pancreatitis Minimally elevated lipase, trended back to WNL CT scan unremarkable   History of recurrent bronchitis On room air Brovana and budesonide nebs substitute for Symbicort Albuterol nebs as needed for shortness of breath/wheezing  L5 compression fracture Chronic in nature, low back pain Dose of gabapentin recommended to be adjusted down to 600 mg 3 times daily for kidney function by pharmacy, but patient declined and requested that it be prescribed 800 mg 4 times daily as prescribed   Bipolar disorder PTA meds: Klonopin 1 tablet 3 times daily, mirtazapine 15  mg nightly, Trintellix 10 mg daily, and Seroquel 400 mg nightly Followed by Dr. Erling Cruz of psychiatry at [facility]. Per review of records she was last seen on 11/24/2020 with a note verifying mirtazapine and Klonopin, Trintellix 10 mg daily.   Continue Klonopin, mirtazapine,Trintellix, and  Seroquel -anxiety appears to be playing a role with her symptoms  Chronic pain/fibromyalgia Patient is followed in the outpatient setting by pain management PTA hydrocodone 5-325 mg 3 times daily as needed for pain, will restart once pain under control Continue outpatient follow-up with pain management -low dose IV meds for now   Tobacco abuse Advised to quit Nicotine patch   Underweight: BMI 15.1 kg/m Dietitian consulted- recommend feeding tube placement    Malnutrition Type:  Nutrition Problem: Severe Malnutrition Etiology: chronic illness (chronic nausea/vomiting)   Malnutrition Characteristics:  Signs/Symptoms: severe fat depletion, severe muscle depletion, energy intake < or equal to 75% for > or equal to 1 month   Nutrition Interventions:  Interventions: Tube feeding    Estimated body mass index is 14.76 kg/m as calculated from the following:   Height as of this encounter: 5\' 4"  (1.626 m).   Weight as of this encounter: 39 kg.     Code Status: Full  Family Communication: husband at bedside  Disposition Plan: Status is: Inpatient  Remains inpatient appropriate because: Level of care    Consultants: GI    DVT prophylaxis: lovenox   Objective: Vitals:   01/24/21 0500 01/24/21 0811 01/24/21 0846 01/24/21 1155  BP:   115/86 104/74  Pulse:  (!) 139 (!) 112 94  Resp:  (!) 28 18 18   Temp:   97.8 F (36.6 C)   TempSrc:   Oral   SpO2:  99% 98% 96%  Weight: 39 kg     Height:        Intake/Output Summary (Last 24 hours) at 01/24/2021 1237 Last data filed at 01/24/2021 0400 Gross per 24 hour  Intake 4064.75 ml  Output 2500 ml  Net 1564.75 ml   Filed Weights   01/22/21 0452 01/23/21 0543 01/24/21 0500  Weight: 40.6 kg 41.2 kg 39 kg    Exam:  General: Appearance:    Thin female who is tearful at times     Lungs:     respirations unlabored  Heart:    Normal heart rate.   MS:   All extremities are intact.    Neurologic:   Awake,  alert, oriented x 3. No apparent focal neurological           defect.        Data Reviewed: CBC: Recent Labs  Lab 01/19/21 0226 01/20/21 1942 01/22/21 0427 01/23/21 0403 01/24/21 0407  WBC 7.8 9.6 6.5 10.4 6.8  NEUTROABS  --  5.6  --  7.6 2.9  HGB 11.6* 12.9 12.0 13.7 12.1  HCT 34.4* 37.4 35.6* 43.7 35.9*  MCV 96.1 93.3 93.9 92.2 95.0  PLT 352 439* 303 208 193   Basic Metabolic Panel: Recent Labs  Lab 01/19/21 0226 01/20/21 1942 01/21/21 1445 01/21/21 1822 01/22/21 0427 01/23/21 0403 01/24/21 0407  NA 135 137  --   --  139 141 139  K 3.1* 3.1*  --   --  4.0 3.9 4.1  CL 104 106  --   --  109 100 106  CO2 24 23  --   --  22 33* 26  GLUCOSE 109* 107*  --   --  56* 113* 108*  BUN 8 6  --   --  <  5* 15 5*  CREATININE 0.73 0.63  --   --  0.72 0.57 0.70  CALCIUM 9.2 9.0  --   --  8.8* 9.0 9.7  MG  --   --  1.8 1.8 1.9 2.1  --   PHOS  --   --  3.0 2.8 3.4 4.4  --    GFR: Estimated Creatinine Clearance: 55.3 mL/min (by C-G formula based on SCr of 0.7 mg/dL). Liver Function Tests: Recent Labs  Lab 01/19/21 0226 01/20/21 1942 01/22/21 0427 01/23/21 0403 01/24/21 0407  AST 22 19 17 18 16   ALT 40 26 21 21 15   ALKPHOS 69 79 56 92 65  BILITOT 0.5 0.3 0.8 0.4 0.3  PROT 7.0 7.5 6.1* 6.1* 6.3*  ALBUMIN 4.0 4.4 3.4* 3.3* 3.5   Recent Labs  Lab 01/19/21 0226 01/20/21 1942 01/23/21 0403  LIPASE 25 53* 24   No results for input(s): AMMONIA in the last 168 hours. Coagulation Profile: No results for input(s): INR, PROTIME in the last 168 hours. Cardiac Enzymes: No results for input(s): CKTOTAL, CKMB, CKMBINDEX, TROPONINI in the last 168 hours. BNP (last 3 results) No results for input(s): PROBNP in the last 8760 hours. HbA1C: No results for input(s): HGBA1C in the last 72 hours. CBG: Recent Labs  Lab 01/23/21 1113 01/23/21 1557 01/24/21 0029 01/24/21 0357 01/24/21 1152  GLUCAP 96 122* 94 119* 102*   Lipid Profile: No results for input(s): CHOL, HDL,  LDLCALC, TRIG, CHOLHDL, LDLDIRECT in the last 72 hours. Thyroid Function Tests: No results for input(s): TSH, T4TOTAL, FREET4, T3FREE, THYROIDAB in the last 72 hours. Anemia Panel: No results for input(s): VITAMINB12, FOLATE, FERRITIN, TIBC, IRON, RETICCTPCT in the last 72 hours. Urine analysis:    Component Value Date/Time   COLORURINE YELLOW 01/20/2021 1935   APPEARANCEUR CLOUDY (A) 01/20/2021 1935   LABSPEC 1.025 01/20/2021 1935   PHURINE 6.0 01/20/2021 1935   GLUCOSEU NEGATIVE 01/20/2021 1935   HGBUR NEGATIVE 01/20/2021 1935   BILIRUBINUR NEGATIVE 01/20/2021 1935   BILIRUBINUR positive 10/02/2019 1038   KETONESUR NEGATIVE 01/20/2021 1935   PROTEINUR NEGATIVE 01/20/2021 1935   UROBILINOGEN 0.2 10/02/2019 1038   UROBILINOGEN 0.2 06/23/2014 1300   NITRITE POSITIVE (A) 01/20/2021 1935   LEUKOCYTESUR SMALL (A) 01/20/2021 1935    Recent Results (from the past 240 hour(s))  Resp Panel by RT-PCR (Flu A&B, Covid) Nasopharyngeal Swab     Status: None   Collection Time: 01/21/21  4:31 AM   Specimen: Nasopharyngeal Swab; Nasopharyngeal(NP) swabs in vial transport medium  Result Value Ref Range Status   SARS Coronavirus 2 by RT PCR NEGATIVE NEGATIVE Final    Comment: (NOTE) SARS-CoV-2 target nucleic acids are NOT DETECTED.  The SARS-CoV-2 RNA is generally detectable in upper respiratory specimens during the acute phase of infection. The lowest concentration of SARS-CoV-2 viral copies this assay can detect is 138 copies/mL. A negative result does not preclude SARS-Cov-2 infection and should not be used as the sole basis for treatment or other patient management decisions. A negative result may occur with  improper specimen collection/handling, submission of specimen other than nasopharyngeal swab, presence of viral mutation(s) within the areas targeted by this assay, and inadequate number of viral copies(<138 copies/mL). A negative result must be combined with clinical observations,  patient history, and epidemiological information. The expected result is Negative.  Fact Sheet for Patients:  EntrepreneurPulse.com.au  Fact Sheet for Healthcare Providers:  IncredibleEmployment.be  This test is no t yet approved or cleared by the [place]  States FDA and  has been authorized for detection and/or diagnosis of SARS-CoV-2 by FDA under an Emergency Use Authorization (EUA). This EUA will remain  in effect (meaning this test can be used) for the duration of the COVID-19 declaration under Section 564(b)(1) of the Act, 21 U.S.C.section 360bbb-3(b)(1), unless the authorization is terminated  or revoked sooner.       Influenza A by PCR NEGATIVE NEGATIVE Final   Influenza B by PCR NEGATIVE NEGATIVE Final    Comment: (NOTE) The Xpert Xpress SARS-CoV-2/FLU/RSV plus assay is intended as an aid in the diagnosis of influenza from Nasopharyngeal swab specimens and should not be used as a sole basis for treatment. Nasal washings and aspirates are unacceptable for Xpert Xpress SARS-CoV-2/FLU/RSV testing.  Fact Sheet for Patients: EntrepreneurPulse.com.au  Fact Sheet for Healthcare Providers: IncredibleEmployment.be  This test is not yet approved or cleared by the [place] FDA and has been authorized for detection and/or diagnosis of SARS-CoV-2 by FDA under an Emergency Use Authorization (EUA). This EUA will remain in effect (meaning this test can be used) for the duration of the COVID-19 declaration under Section 564(b)(1) of the Act, 21 U.S.C. section 360bbb-3(b)(1), unless the authorization is terminated or revoked.  Performed at [facility], [address] [place], [place] [address]   Urine Culture     Status: Abnormal   Collection Time: 01/21/21  9:45 AM   Specimen: Urine, Catheterized  Result Value Ref Range Status   Specimen Description URINE, CATHETERIZED  Final   Special  Requests   Final    NONE Performed at [facility], [address], [place], [place] [address]    Culture >=100,000 COLONIES/mL ESCHERICHIA COLI (A)  Final   Report Status 01/24/2021 FINAL  Final   Organism ID, Bacteria ESCHERICHIA COLI (A)  Final      Susceptibility   Escherichia coli - MIC*    AMPICILLIN <=2 SENSITIVE Sensitive     CEFAZOLIN <=4 SENSITIVE Sensitive     CEFEPIME <=0.12 SENSITIVE Sensitive     CEFTRIAXONE <=0.25 SENSITIVE Sensitive     CIPROFLOXACIN <=0.25 SENSITIVE Sensitive     GENTAMICIN 4 SENSITIVE Sensitive     IMIPENEM <=0.25 SENSITIVE Sensitive     NITROFURANTOIN <=16 SENSITIVE Sensitive     TRIMETH/SULFA <=20 SENSITIVE Sensitive     AMPICILLIN/SULBACTAM <=2 SENSITIVE Sensitive     PIP/TAZO <=4 SENSITIVE Sensitive     * >=100,000 COLONIES/mL ESCHERICHIA COLI      Studies: DG Abd 1 View  Result Date: 01/24/2021 CLINICAL DATA:  NG tube placement EXAM: ABDOMEN - 1 VIEW COMPARISON:  None. FINDINGS: Enteric tube terminates in the gastric cardia. Nonobstructive bowel gas pattern. Lung bases are clear. IMPRESSION: Enteric tube terminates in the gastric cardia. Electronically Signed   By: Julian Hy M.D.   On: 01/24/2021 03:30    Scheduled Meds:  arformoterol  15 mcg Nebulization BID   budesonide (PULMICORT) nebulizer solution  0.5 mg Nebulization BID   Chlorhexidine Gluconate Cloth  6 each Topical Daily   cycloSPORINE  1 drop Both Eyes BID   enoxaparin (LOVENOX) injection  30 mg Subcutaneous Q24H   estradiol  2 mg Oral Daily   fluticasone  2 spray Each Nare Daily   free water  100 mL Per Tube Q4H   gabapentin  800 mg Oral QID   guaiFENesin  600 mg Oral BID   hydrOXYzine  50 mg Oral QHS   mirtazapine  15 mg Oral QHS  NEOMYCIN-POLYMYXIN-HYDROCORTISONE  3 drop Left EAR Q8H   nicotine  21 mg Transdermal Daily   oxybutynin  15 mg Oral Daily   pantoprazole (PROTONIX) IV  40 mg Intravenous Q12H   QUEtiapine  400 mg Oral QHS   sodium chloride  flush  3 mL Intravenous Q12H   vortioxetine HBr  10 mg Oral Daily    Continuous Infusions:  cefTRIAXone (ROCEPHIN)  IV 1 g (01/24/21 0901)   feeding supplement (OSMOLITE 1.2 CAL) 55 mL/hr at 01/24/21 0606   promethazine (PHENERGAN) injection (IM or IVPB) 12.5 mg (01/24/21 0824)     LOS: 3 days     Geradine Girt, DO [facility]  If 7PM-7AM, please contact night-coverage [email or web address] 01/24/2021, 12:37 PM

## 2021-01-24 NOTE — Progress Notes (Signed)
Patient stating she wants IV pain medication. In formed patient that she has none available , Notified MD. Md will address when she does rounds.

## 2021-01-24 NOTE — Progress Notes (Signed)
SLP Cancellation Note  Patient Details Name: MICKAELA STARLIN MRN: [id number] DOB: June 15, 1975   Cancelled treatment:        Pt declined MBSS when transport arrived.  Went to see pt, who has had a significant change in status since evaluation yesterday 10/16.  Pt is having difficulty with secretion management and is using yankaur in lieu of swallowing.  Pt c/o pain in throat and ear and recoils to touch around these areas.  No obvious swelling or redness noted externally.  Pt is writhing in bed requesting medication.  Pt thinks it may be possible that NG is causing discomfort and noted it started following placement of tube.  Pt is unable to participate in MBSS at this time we will hold and reassess when pt is appropriate for further evaluation.  Pt would benefit from swallowing evaluation to inform decision making around nutritional access. SLP is happy to reattempt MBSS, but may also want to consider GI referral for esophagram.  Pt has had normal EGDs in the past and esophagram may provide additional information regarding motility.   Messaged MD who is planning for consult.  Will await GI input.    Celedonio Savage, MA, [place] Office: [phone] 01/24/2021, 9:59 AM

## 2021-01-24 NOTE — Progress Notes (Signed)
Patient stating she does not want this nurse to be with her today. Patient states that this nurse stated she smells bad when placing a foley on 01/21/21. The nurse was accompanied by NT Brianna. This nurse never said that. Notified charge nurse.

## 2021-01-24 NOTE — Progress Notes (Signed)
Suction set up for patient. Pt c/o throat pain.

## 2021-01-24 NOTE — Consult Note (Addendum)
[facility] Consult: 12:46 PM 01/24/2021  LOS: 3 days    Referring Provider: Dr Eliseo Squires  Primary Care Physician:  Neale Burly, MD Primary Gastroenterologist:  Dr. Jenetta Downer.     Reason for Consultation: Dysphagia.   HPI: [name] is a 45 y.o. female.  PMH interstitial cystitis. Recurrent UTIs.  Previous bladder stimulator placement, removed after it "broke down".  Vaginal and oral candidiasis. Has scheduled surgery for bladder stimulator implant and Botox injection with Dr. Amalia Hailey on 10/21.  Bipolar disorder.  Hypertension.  Endometriosis.  Hysterectomy, B SPO/    Adenomatous colon polyps dating back to 2008.  Chart also mentions anal fissure, IBS.  Pancreatitis (no details, self reported by pt).  Cholecystectomy prior to 2009.  Pancreas divisum as far back as 2009.  2014 CT confirmed pancreas divisum and previous cholecystectomy, stable 12 mm CBD.   2016 colonoscopy normal.  No polyps. 06/2020 EGD for evaluation abdominal pain, nausea, vomiting.  White, nummular plaques in esophagus biopsies and confirmed as Candida..  Stomach and duodenum normal.  Duodenal biopsies unremarkable. 06/2020 attempted colonoscopy, converted to flexible sigmoidoscopy due to presence of solid stool.  Plan was repeat colonoscopy with 2-day prep  Suffers from chronic lower abdominal pain attributed to interstitial cystitis. > 2 years of nausea, vomiting.  EGD performed as above.  Dysphagia to solids but also sometimes liquids for 2 months.  Bowel habits alternate between constipation and loose stools.  For the past week her stools have been watery, nonbloody.  Pt evaluated by nutritionist who suggested tube feedings in order to help her gain weight and have her ready for repeat bladder scan device placement.  This outpatient  nutritionist also suggested placement of feeding tube to optimize nutrition.  Seen 10/12 at [facility], ED for abdominal pain, nausea, vomiting refractory to home meds.  Treated with Dilaudid, Phenergan, IV fluids and sent home with increased dose dissolvable Zofran, rectal Phenergan.  Has hydrocodone available but unable to tolerate due to nausea, vomiting. Presented to [facility] ED 10/13 w same symptoms.  Then admitted. 01/20/2021 CTAP w contrast: No acute intra-abdominal, intrapelvic abnormalities.  Stable L5 compression fracture. NG tube placed 10/15.  Aim was to place postpyloric tube but radiology unable to position tube and postpyloric position so tube terminated in stomach.  Since placement of NG tube she has had left ear, neck pain.  Tolerating tube feeds.    KUB today 10/17 shows tube terminating in gastric cardia. Speech pathology swallow study ordered but unable to complete MBS as she cannot handle her secretions at present.  Hgb 12.1.  MCV 95.  LFTs, lipase normal.  BUN/creatinine at or below normal.     Past Medical History:  Diagnosis Date   Anal fissure 03/11/2009   Anemia    Anxiety and depression    ARTHRITIS 08/31/2007   Asthma    BENZODIAZEPINE ADDICTION 08/31/2007   Bipolar 1 disorder (HCC)    Bowel obstruction (HCC)    Breakdown of urinary electronic stimulator device, init (HCC)    BRONCHITIS, RECURRENT  08/23/2009   Asthmatic Bronchitis-Dr. Melvyn Novas.....-HFA 75% 12/04/2008>75% 02/05/2009>75% 08/04/2009 -PFT's 01/04/2009 2.56 (86%) ratio 75, no resp to B2 and DLC0 67% > 80 after correction    Cancer (HCC)    cervical cancer   Chronic interstitial cystitis 03/11/2009   Chronic nausea    Chronic pain    COLONIC POLYPS, HX OF 07/25/2006   ADENOMATOUS POLYP   COPD (chronic obstructive pulmonary disease) (HCC)    Endometriosis    FIBROMYALGIA 08/31/2007   GERD 02/05/2009   Hyperlipidemia    HYPERTENSION 08/31/2007   IBS 03/11/2009   Internal  hemorrhoids    Migraine headache    NEPHROLITHIASIS 08/31/2007   Pancreatitis    PONV (postoperative nausea and vomiting)    RECTAL BLEEDING 03/11/2009   Sciatica    right leg   Seizures ([place])    been about 1 year since last seisure per pt   SLEEP APNEA 08/31/2007   Substance abuse ([place])    Thyroid disease    Uterine cyst     Past Surgical History:  Procedure Laterality Date   ABDOMINAL HYSTERECTOMY     BIOPSY  10/25/2018   Procedure: BIOPSY;  Surgeon: Rogene Houston, MD;  Location: [facility];  Service: Endoscopy;;  gastric   BIOPSY  06/11/2020   Procedure: BIOPSY;  Surgeon: Montez Morita, Quillian Quince, MD;  Location: [facility];  Service: Gastroenterology;;  small bowel   bladder stretching x6     BLADDER SURGERY     stimulator placed and stretching    CHOLECYSTECTOMY     COLONOSCOPY     COLONOSCOPY WITH PROPOFOL N/A 09/03/2014   Procedure: COLONOSCOPY WITH PROPOFOL;  Surgeon: Milus Banister, MD;  Location: [facility];  Service: Endoscopy;  Laterality: N/A;   ESOPHAGOGASTRODUODENOSCOPY (EGD) WITH PROPOFOL N/A 10/25/2018   Procedure: ESOPHAGOGASTRODUODENOSCOPY (EGD) WITH PROPOFOL;  Surgeon: Rogene Houston, MD;  Location: [facility];  Service: Endoscopy;  Laterality: N/A;  11:15   ESOPHAGOGASTRODUODENOSCOPY (EGD) WITH PROPOFOL N/A 06/11/2020   Procedure: ESOPHAGOGASTRODUODENOSCOPY (EGD) WITH PROPOFOL;  Surgeon: Harvel Quale, MD;  Location: [facility];  Service: Gastroenterology;  Laterality: N/A;   interstitial cystitis     PACEMAKER INSERTION     in hip for interstitial cystitis   pacemaker removal     removal of uterine cyst and scrapped uterus     replaced bladder pacemaker      Prior to Admission medications   Medication Sig Start Date End Date Taking? Authorizing Provider  acetaminophen (TYLENOL) 500 MG tablet Take 1,000 mg by mouth every 6 (six) hours as needed for mild pain, fever or headache.   Yes [provider]  albuterol (PROAIR  HFA) 108 (90 Base) MCG/ACT inhaler INHALE 2 PUFFS EVERY 6 HOURS AS NEEDED FOR SHORTNESS OF BREATH AND WHEEZING. Patient taking differently: No sig reported 02/27/20  Yes Lowne Chase, Yvonne R, DO  clonazePAM (KLONOPIN) 0.5 MG tablet Take 1 tablet (0.5 mg total) by mouth 3 (three) times daily as needed for anxiety. 05/01/19  Yes [name], DO  cyclobenzaprine (FLEXERIL) 5 MG tablet Take 1 tablet (5 mg total) by mouth 3 (three) times daily as needed for muscle spasms. 05/21/20  Yes Mordecai Rasmussen, MD  estradiol (ESTRACE) 0.1 MG/GM vaginal cream Place 1 Applicatorful vaginally every 3 (three) days. 11/21/19  Yes [provider]  estradiol (ESTRACE) 2 MG tablet Take 2 mg by mouth daily.   Yes [provider]  fluticasone (FLONASE) 50 MCG/ACT nasal spray  Place 2 sprays into both nostrils daily. Patient taking differently: Place 2 sprays into both nostrils daily as needed for allergies. 04/22/19  Yes Roma Schanz R, DO  gabapentin (NEURONTIN) 800 MG tablet Take 800 mg by mouth in the morning, at noon, in the evening, and at bedtime. 05/19/20  Yes [provider]  HYDROcodone-acetaminophen (NORCO/VICODIN) 5-325 MG tablet Take 1 tablet by mouth 3 (three) times daily as needed for pain. 05/25/20  Yes [provider]  hydrOXYzine (ATARAX/VISTARIL) 50 MG tablet Take 50 mg by mouth at bedtime. 08/26/18  Yes [provider]  Hyoscyamine Sulfate SL 0.125 MG SUBL Take 0.125 mg by mouth every 4 (four) hours as needed (for cramping). 11/04/19  Yes [provider]  mirtazapine (REMERON) 15 MG tablet Take 15 mg by mouth at bedtime. 11/24/20  Yes [provider]  omeprazole (PRILOSEC) 40 MG capsule TAKE 1 CAPSULE BY MOUTH ONCE DAILY. Patient taking differently: Take 40 mg by mouth daily. 06/22/20  Yes Roma Schanz R, DO  ondansetron (ZOFRAN-ODT) 8 MG disintegrating tablet Take 1 tablet (8 mg total) by mouth every 8 (eight) hours as needed for  nausea or vomiting. 11/09/21  Yes Delora Fuel, MD  oxybutynin (DITROPAN XL) 15 MG 24 hr tablet Take 15 mg by mouth daily. 09/16/19  Yes [provider]  promethazine (PHENERGAN) 25 MG tablet TAKE (1) TABLET EVERY SIX HOURS AS NEEDED FOR NAUSEA AND VOMITING. Patient taking differently: Take 25 mg by mouth every 6 (six) hours as needed for nausea or vomiting. 11/08/20  Yes Roma Schanz R, DO  QUEtiapine (SEROQUEL) 400 MG tablet Take 400 mg by mouth at bedtime.  09/16/18  Yes [provider]  RESTASIS 0.05 % ophthalmic emulsion Place 1 drop into both eyes 2 (two) times daily. 02/26/20  Yes [provider]  SYMBICORT 80-4.5 MCG/ACT inhaler INHALE 2 PUFFS INTO THE LUNGS TWICE DAILY. Patient taking differently: Inhale 2 puffs into the lungs in the morning and at bedtime. 12/28/20  Yes Saguier, Percell Miller, PA-C  potassium chloride SA (KLOR-CON) 20 MEQ tablet Take 1 tablet (20 mEq total) by mouth 2 (two) times daily. Patient not taking: No sig reported 36/12/24   Delora Fuel, MD  promethazine (PHENERGAN) 25 MG suppository Place 1 suppository (25 mg total) rectally every 6 (six) hours as needed for nausea or vomiting. Patient not taking: No sig reported 49/75/30   Delora Fuel, MD    Scheduled Meds:  arformoterol  15 mcg Nebulization BID   budesonide (PULMICORT) nebulizer solution  0.5 mg Nebulization BID   Chlorhexidine Gluconate Cloth  6 each Topical Daily   cycloSPORINE  1 drop Both Eyes BID   enoxaparin (LOVENOX) injection  30 mg Subcutaneous Q24H   estradiol  2 mg Oral Daily   fluticasone  2 spray Each Nare Daily   free water  100 mL Per Tube Q4H   gabapentin  800 mg Oral QID   guaiFENesin  600 mg Oral BID   hydrOXYzine  50 mg Oral QHS   mirtazapine  15 mg Oral QHS   NEOMYCIN-POLYMYXIN-HYDROCORTISONE  3 drop Left EAR Q8H   nicotine  21 mg Transdermal Daily   oxybutynin  15 mg Oral Daily   pantoprazole (PROTONIX) IV  40 mg Intravenous Q12H   QUEtiapine  400 mg Oral  QHS   sodium chloride flush  3 mL Intravenous Q12H   vortioxetine HBr  10 mg Oral Daily   Infusions:  cefTRIAXone (ROCEPHIN)  IV 1 g (  01/24/21 0901)   feeding supplement (OSMOLITE 1.2 CAL) 55 mL/hr at 01/24/21 0606   promethazine (PHENERGAN) injection (IM or IVPB) 12.5 mg (01/24/21 0824)   PRN Meds: acetaminophen, albuterol, Benzocaine, clonazePAM, cyclobenzaprine, HYDROmorphone (DILAUDID) injection, hyoscyamine, oxyCODONE, promethazine (PHENERGAN) injection (IM or IVPB)   Allergies as of 01/20/2021 - Review Complete 01/20/2021  Allergen Reaction Noted   Abilify [aripiprazole] Swelling, Palpitations, and Other (See Comments) 12/28/2011   Amitriptyline Anaphylaxis 12/06/2012   Metoclopramide hcl Other (See Comments)    Propoxyphene Rash 02/23/2015   Toradol [ketorolac tromethamine]  03/20/2020   Tramadol Swelling, Other (See Comments), and Rash 12/28/2011   Ambien [zolpidem tartrate] Other (See Comments) 12/28/2011   Eszopiclone Other (See Comments) 06/21/2011   Varenicline Other (See Comments) 06/19/2013   Buprenorphine hcl Itching and Hives 05/12/2014   Demerol [meperidine] Rash 06/15/2013   Emetrol Hives, Itching, and Rash 06/19/2013   Morphine and related Hives and Itching 12/28/2011    Family History  Problem Relation Age of Onset   Heart disease Father    Asthma Maternal Grandmother    Emphysema Maternal Grandfather    Cancer Maternal Grandfather        Lung Cancer   Cancer Other        Lung Cancer-Aunt   Colon cancer Neg Hx    Esophageal cancer Neg Hx    Rectal cancer Neg Hx    Stomach cancer Neg Hx    Thyroid disease Neg Hx     Social History   Socioeconomic History   Marital status: Married    Spouse name: Not on file   Number of children: 2   Years of education: Not on file   Highest education level: Not on file  Occupational History    Employer: UNEMPLOYED  Tobacco Use   Smoking status: Every Day    Packs/day: 0.50    Years: 14.00    Pack years:  7.00    Types: Cigarettes   Smokeless tobacco: Never  Vaping Use   Vaping Use: Some days  Substance and Sexual Activity   Alcohol use: Not Currently    Comment: last drink 3 weeks ago; recently released from rehab   Drug use: Not Currently    Types: Marijuana    Comment: once a month   Sexual activity: Not on file  Other Topics Concern   Not on file  Social History Narrative   Homemaker   Daily Caffeine Use-Mtn. Dew         Social Determinants of Health   Financial Resource Strain: Not on file  Food Insecurity: Not on file  Transportation Needs: Not on file  Physical Activity: Not on file  Stress: Not on file  Social Connections: Not on file  Intimate Partner Violence: Not on file    REVIEW OF SYSTEMS: Constitutional: Some weakness, fatigue but not profound. ENT:  No nose bleeds Pulm: No shortness of breath.  Positive nonpurulent cough. CV:  No palpitations, no LE edema.  GU: See HPI. GI: See HPI. Heme: No unusual bleeding or bruising. Transfusions: None. Neuro:  No headaches, no peripheral tingling or numbness.  No syncope, no seizures. Derm:  No itching, no rash or sores.  Endocrine:  No sweats or chills.  Immunization: Reviewed Travel:  None beyond local counties in last few months.    PHYSICAL EXAM: Vital signs in last 24 hours: Vitals:   01/24/21 0846 01/24/21 1155  BP: 115/86 104/74  Pulse: (!) 112 94  Resp: 18 18  Temp: 97.8  F (36.6 C)   SpO2: 98% 96%   Wt Readings from Last 3 Encounters:  01/24/21 39 kg  01/19/21 40.4 kg  10/21/20 41.7 kg    General: Thin, comfortable, alert Head: No facial asymmetry or swelling.  No signs of head trauma. Eyes: Nonicteric sclera.  Conjunctiva pink. Ears: Not hard of hearing Nose: No congestion or discharge.  NG tube in place infusing tube feeds. Mouth: Tongue midline.  Mucosa moist, pink, clear.  No yeast. Neck: No JVD, no masses, no thyromegaly.  Tenderness on the side and left posterior cervical  area Lungs: Clear bilaterally.  No cough, no labored breathing. Heart: RRR. Abdomen: Soft, diffusely, mild to moderately tender without guarding or rebound.  Bowel sounds active.  No HSM, masses, bruits, hernias. Rectal: Deferred Musc/Skeltl: No joint redness, swelling or gross deformity.  Limbs generally thin. Extremities: No CCE Neurologic: Oriented x3.  No tremors.  Moves all 4 limbs without gross deficit, formal strength testing not performed. Skin: No rash, no sores, a few professional quality tattoos. Nodes: No cervical adenopathy Psych: Fluid speech.  Calm.  Cooperative.  Intake/Output from previous day: 10/16 0701 - 10/17 0700 In: 4899.1 [P.O.:1230; I.V.:1680.2; NG/GT:1788.9; IV Piggyback:200] Out: 5176 [Urine:3700] Intake/Output this shift: No intake/output data recorded.  LAB RESULTS: Recent Labs    01/22/21 0427 01/23/21 0403 01/24/21 0407  WBC 6.5 10.4 6.8  HGB 12.0 13.7 12.1  HCT 35.6* 43.7 35.9*  PLT 303 208 323   BMET Lab Results  Component Value Date   NA 139 01/24/2021   NA 141 01/23/2021   NA 139 01/22/2021   K 4.1 01/24/2021   K 3.9 01/23/2021   K 4.0 01/22/2021   CL 106 01/24/2021   CL 100 01/23/2021   CL 109 01/22/2021   CO2 26 01/24/2021   CO2 33 (H) 01/23/2021   CO2 22 01/22/2021   GLUCOSE 108 (H) 01/24/2021   GLUCOSE 113 (H) 01/23/2021   GLUCOSE 56 (L) 01/22/2021   BUN 5 (L) 01/24/2021   BUN 15 01/23/2021   BUN <5 (L) 01/22/2021   CREATININE 0.70 01/24/2021   CREATININE 0.57 01/23/2021   CREATININE 0.72 01/22/2021   CALCIUM 9.7 01/24/2021   CALCIUM 9.0 01/23/2021   CALCIUM 8.8 (L) 01/22/2021   LFT Recent Labs    01/22/21 0427 01/23/21 0403 01/24/21 0407  PROT 6.1* 6.1* 6.3*  ALBUMIN 3.4* 3.3* 3.5  AST 17 18 16   ALT 21 21 15   ALKPHOS 56 92 65  BILITOT 0.8 0.4 0.3   PT/INR Lab Results  Component Value Date   INR 0.9 12/19/2018   INR 0.9 08/24/2018   Hepatitis Panel No results for input(s): HEPBSAG, HCVAB, HEPAIGM,  HEPBIGM in the last 72 hours. C-Diff No components found for: CDIFF Lipase     Component Value Date/Time   LIPASE 24 01/23/2021 0403    Drugs of Abuse     Component Value Date/Time   LABOPIA POSITIVE (A) 01/21/2021 0945   COCAINSCRNUR NONE DETECTED 01/21/2021 0945   LABBENZ NONE DETECTED 01/21/2021 0945   AMPHETMU NONE DETECTED 01/21/2021 0945   THCU POSITIVE (A) 01/21/2021 0945   LABBARB NONE DETECTED 01/21/2021 0945     RADIOLOGY STUDIES: DG Abd 1 View  Result Date: 01/24/2021 CLINICAL DATA:  NG tube placement EXAM: ABDOMEN - 1 VIEW COMPARISON:  None. FINDINGS: Enteric tube terminates in the gastric cardia. Nonobstructive bowel gas pattern. Lung bases are clear. IMPRESSION: Enteric tube terminates in the gastric cardia. Electronically Signed   By: Bertis Ruddy  [place] Pink M.D.   On: 01/24/2021 03:30   DG [place] Dys Tube Plc W/Fl W/Rad  Result Date: 01/22/2021 CLINICAL DATA:  NG tube placement EXAM: NASO G TUBE PLACEMENT WITH FL AND WITH RAD FLUOROSCOPY TIME:  Fluoroscopy Time:  36 seconds Radiation Exposure Index (if provided by the fluoroscopic device): 1 mGy COMPARISON:  None. FINDINGS: Multiple attempts were made to place the enteric tube in a post pyloric position. However, the tube could not be directed toward the right. Ultimately, the tube was left within the stomach directed toward the left. IMPRESSION: Fluoroscopic guided placement of enteric tube within the stomach. Post pyloric positioning could not be obtained. Electronically Signed   By: Macy Mis M.D.   On: 01/22/2021 17:25      IMPRESSION:      Dysphagia.  This was not an issue leading up to EGD in March.  At that time EGD,  performed for chronic nausea, vomiting, abdominal pain revealed Candida esophagitis, no esophageal stricture.  Takes omeprazole 40 mg daily.  Because of weight loss, malnutrition, and chronic nausea, vomiting it had been suggested she have feeding tube placed and an NG tube is now in place.  Her  prealbumin is normal at 20.8.     History adenomatous, colon polyps dating back to 2008.  Unsuccessful colonoscopy 06/2020 due to retained stool.  Longstanding history of IBS alternating constipation, diarrhea.  Currently watery stools.  Not had stool studies recently.  C. difficile toxin detected on stool PCR in June 2020.     Interstitial cystitis, significantly symptomatic with recurrent UTIs, chronic lower abdominal pain, unable to urinate so requires self cath.  Previous bladder stimulator in place, removed after it broke.  Plan is for placement of bladder stimulator and Botox injection on 10/21 with Dr. Amalia Hailey.  E. coli growing from urine.  On Rocephin.      PLAN:     Rather than speech pathology swallow testing, would suggest barium esophagram w tablet.  Rule out esoph spasm, doubt stricture.  In order to perform an esophagram the NG tube will have to be removed.  Therefore I did not order this study  Repeat EGD?,  She may have ongoing/recurrent Candida esophagitis     Send stool for C. difficile, stool PCR?     Defer on subjective of long-term feeding tube placement to her PCP.   Azucena Freed  01/24/2021, 12:46 PM Phone [phone]       Attending physician's note   I have taken an interval history, reviewed the chart and examined the patient. I agree with the Advanced Practitioner's note, impression and recommendations.   45yr old with bipolar disorder, COPD, fibromyalgia, IBS, H/O colonic polyps, H/O substance abuse with interstitial cystitis.   She has several chronic likely functional GI complaints including N/V/Abdo pain with neg GI eval. Had neg EGD 06/2020 (except for Candida esophagitis, treated with Diflucan), neg CT [facility] with contrast. Had attempted colon for H/O polyps, stopped due to poor prep.  Had profound weight loss (130lb to 90lb over 2 yrs) with failure to thrive req NG tube for feeds with plans to transition her to PEG/PEJ.  Since NG tube placement, she had  "dysphagia" with severe left ear and neck pain.  She is tolerating NG tube feeds without any problems. No s/s of bowel obstruction.  Recommend: -GI cocktail PRN -Ba swallow if continued problems. -No need for repeat EGD at this time. -Can proceed with urology work-up (bladder stimulator/Botox) -Advance tube feeds as  tolerated. Can consider PEG/PEJ by IR near D/C as planned.    Carmell Austria, MD [facility] [phone]

## 2021-01-25 ENCOUNTER — Inpatient Hospital Stay (HOSPITAL_COMMUNITY): Payer: BC Managed Care – PPO

## 2021-01-25 DIAGNOSIS — N390 Urinary tract infection, site not specified: Secondary | ICD-10-CM

## 2021-01-25 DIAGNOSIS — F319 Bipolar disorder, unspecified: Secondary | ICD-10-CM | POA: Diagnosis not present

## 2021-01-25 DIAGNOSIS — R1312 Dysphagia, oropharyngeal phase: Secondary | ICD-10-CM

## 2021-01-25 DIAGNOSIS — N3010 Interstitial cystitis (chronic) without hematuria: Secondary | ICD-10-CM | POA: Diagnosis not present

## 2021-01-25 DIAGNOSIS — M797 Fibromyalgia: Secondary | ICD-10-CM | POA: Diagnosis not present

## 2021-01-25 DIAGNOSIS — R1310 Dysphagia, unspecified: Secondary | ICD-10-CM

## 2021-01-25 LAB — CBC WITH DIFFERENTIAL/PLATELET
Abs Immature Granulocytes: 0.02 10*3/uL (ref 0.00–0.07)
Basophils Absolute: 0.1 10*3/uL (ref 0.0–0.1)
Basophils Relative: 1 %
Eosinophils Absolute: 0.3 10*3/uL (ref 0.0–0.5)
Eosinophils Relative: 4 %
HCT: 35.6 % — ABNORMAL LOW (ref 36.0–46.0)
Hemoglobin: 11.8 g/dL — ABNORMAL LOW (ref 12.0–15.0)
Immature Granulocytes: 0 %
Lymphocytes Relative: 48 %
Lymphs Abs: 3.1 10*3/uL (ref 0.7–4.0)
MCH: 31.9 pg (ref 26.0–34.0)
MCHC: 33.1 g/dL (ref 30.0–36.0)
MCV: 96.2 fL (ref 80.0–100.0)
Monocytes Absolute: 0.5 10*3/uL (ref 0.1–1.0)
Monocytes Relative: 9 %
Neutro Abs: 2.4 10*3/uL (ref 1.7–7.7)
Neutrophils Relative %: 38 %
Platelets: 302 10*3/uL (ref 150–400)
RBC: 3.7 MIL/uL — ABNORMAL LOW (ref 3.87–5.11)
RDW: 12.7 % (ref 11.5–15.5)
WBC: 6.4 10*3/uL (ref 4.0–10.5)
nRBC: 0 % (ref 0.0–0.2)

## 2021-01-25 LAB — COMPREHENSIVE METABOLIC PANEL
ALT: 12 U/L (ref 0–44)
AST: 34 U/L (ref 15–41)
Albumin: 3.5 g/dL (ref 3.5–5.0)
Alkaline Phosphatase: 63 U/L (ref 38–126)
Anion gap: 7 (ref 5–15)
BUN: 7 mg/dL (ref 6–20)
CO2: 29 mmol/L (ref 22–32)
Calcium: 9.6 mg/dL (ref 8.9–10.3)
Chloride: 103 mmol/L (ref 98–111)
Creatinine, Ser: 0.72 mg/dL (ref 0.44–1.00)
GFR, Estimated: >60 mL/min (ref >60)
Glucose, Bld: 90 mg/dL (ref 70–99)
Potassium: 4.1 mmol/L (ref 3.5–5.1)
Sodium: 139 mmol/L (ref 135–145)
Total Bilirubin: 0.3 mg/dL (ref 0.3–1.2)
Total Protein: 6.3 g/dL — ABNORMAL LOW (ref 6.5–8.1)

## 2021-01-25 LAB — GLUCOSE, CAPILLARY
Glucose-Capillary: 106 mg/dL — ABNORMAL HIGH (ref 70–99)
Glucose-Capillary: 113 mg/dL — ABNORMAL HIGH (ref 70–99)
Glucose-Capillary: 128 mg/dL — ABNORMAL HIGH (ref 70–99)
Glucose-Capillary: 137 mg/dL — ABNORMAL HIGH (ref 70–99)
Glucose-Capillary: 82 mg/dL (ref 70–99)

## 2021-01-25 MED ORDER — AMOXICILLIN 250 MG/5ML PO SUSR
500.0000 mg | Freq: Three times a day (TID) | ORAL | Status: DC
  Administered 2021-01-26 – 2021-01-27 (×5): 500 mg
  Filled 2021-01-25 (×6): qty 10

## 2021-01-25 MED ORDER — AMOXICILLIN 250 MG/5ML PO SUSR
500.0000 mg | Freq: Three times a day (TID) | ORAL | Status: DC
  Filled 2021-01-25: qty 10

## 2021-01-25 MED ORDER — ONDANSETRON HCL 4 MG/2ML IJ SOLN
4.0000 mg | Freq: Four times a day (QID) | INTRAMUSCULAR | Status: DC | PRN
  Administered 2021-01-25 – 2021-02-01 (×15): 4 mg via INTRAVENOUS
  Filled 2021-01-25 (×16): qty 2

## 2021-01-25 NOTE — Progress Notes (Signed)
Speech Language Pathology Treatment: Dysphagia  Patient Details Name: [name] MRN: [id number] DOB: 06-19-1975 Today's Date: 01/25/2021 Time: 4360-1658 SLP Time Calculation (min) (ACUTE ONLY): 13 min    MBS scheduled today at 1300.   Orbie Pyo [place].Ed Risk analyst [phone] Office [phone]

## 2021-01-25 NOTE — Progress Notes (Signed)
Speech Language Pathology Treatment: Dysphagia  Patient Details Name: [name] MRN: [id number] DOB: [birth year] Today's Date: 01/25/2021 Time: 3428-7681 SLP Time Calculation (min) (ACUTE ONLY): 13 min  Assessment / Plan / Recommendation Clinical Impression  Pt is much improved as compared to yesterday and her pain is better managed.  Today she c/o R ear and throat pain (as compared to L yesterday) which also causes odynophagia.  Today pt is managing her secretions without difficulty.  She was able to tolerate puree and thin liquid trials.  She reports stasis of puree after swallow but endorses that liquid was is beneficial to clear.  She also does a coughing maneuver to try to clear residuals. She continues to be in agreement with trying maximize oral intake, but using supplemental AMN as needed.  Consider dietary supplements (Ensure, magic cup) if appropriate.  Agree with GI consult that barium esophagram would be more beneficial that modified speech swallow testing.  However, if SLP will remain available to perform in the event it is preferred d/t NGT placement or as a supplementary evaluation.  Recommend continuing puree texture diet with thin liquids.     HPI HPI: [name] is a 45 y.o. female who presented with complaints abdominal pain with nausea and vomiting over the last 2 weeks.  Pt has had significant weight loss.  She reports difficulty going back years with worsening in past 2 months resulting in inability to eat any solid foods.  Lower lungs clear on Ab CT 10/13.  Pt has been seen by GI in the past and has had multiple normal EGDs.  Pt reports most recent BM 5 days ago.  She states she does go multiple days without BM at times, but that this is not normal for her.  She reports hx of IBS.  Pt with medical history significant of hypertension, hyperlipidemia, chronic interstitial cystitis, COPD, pancreatitis, bipolar disorder, tobacco abuse, anxiety, and depression.      SLP Plan   Continue with current plan of care      Recommendations for follow up therapy are one component of a multi-disciplinary discharge planning process, led by the attending physician.  Recommendations may be updated based on patient status, additional functional criteria and insurance authorization.    Recommendations  Diet recommendations: Dysphagia 1 (puree);Thin liquid Liquids provided via: Cup;Straw Medication Administration: Crushed with puree Supervision: Patient able to self feed Compensations: Slow rate;Small sips/bites Postural Changes and/or Swallow Maneuvers: Seated upright 90 degrees;Upright 30-60 min after meal                Oral Care Recommendations: Oral care BID Follow up Recommendations:  (TBD) SLP Visit Diagnosis: Dysphagia, pharyngoesophageal phase (R13.14) Plan: Continue with current plan of care       [place] , [place], [place] Office: [phone]  01/25/2021, 10:51 AM

## 2021-01-25 NOTE — Plan of Care (Signed)
  Problem: Education: Goal: Knowledge of General Education information will improve Description Including pain rating scale, medication(s)/side effects and non-pharmacologic comfort measures Outcome: Progressing   

## 2021-01-25 NOTE — Progress Notes (Signed)
Nutrition Follow-up  DOCUMENTATION CODES:   Severe malnutrition in context of chronic illness, Underweight  INTERVENTION:   -Continue Osmolite 1.2 @ 65 ml/hr via NGT  100 ml free water flush every 4 hours  Tube feeding regimen provides 1872 kcal (100% of needs), 87 grams of protein, and 1279 ml of H2O. Total free water: 1879 ml daily  NUTRITION DIAGNOSIS:   Severe Malnutrition related to chronic illness (chronic nausea/vomiting) as evidenced by severe fat depletion, severe muscle depletion, energy intake < or equal to 75% for > or equal to 1 month.  Ongoing  GOAL:   Patient will meet greater than or equal to 90% of their needs, Weight gain  Met with TF  MONITOR:   TF tolerance, Labs, Weight trends, Skin, I & O's  REASON FOR ASSESSMENT:   Consult Assessment of nutrition requirement/status, Diet education  ASSESSMENT:   45 yo female with a PMH of HTN, HLD, bipolar disorder, IBS, pancreatitis, interstitial cystitis, and COPD who presents with UTI. Vomiting over the last week, decreased PO intake.  10/14- cortrak unsuccessful 10/15- NGT placed by IR 10/16- s/p BSE_- advanced to dysphagia 1 diet with thin liquids  Reviewed I/O's: +359 ml x 24 hours and +2.3 L since admission  UOP: 1.3 L x 24 hours  Per SLP notes, MBSS scheduled today at 1300. Per GI notes, no plans for EGD at this time.   Pt receiving nursing care at time of visit.   Pt on a PO diet, but remains with minimal intake (documented PO 05%0. Per chart review, pt mainly taking sips of liquids- it remains doubtful that pt will be able to meet nutritional needs via PO intake alone and would continue to recommend permanent feeding access (ex PEG).    Labs reviewed: CBGS: 82-113.   Diet Order:   Diet Order             DIET - DYS 1 Room service appropriate? Yes; Fluid consistency: Thin  Diet effective now                   EDUCATION NEEDS:   Education needs have been addressed  Skin:  Skin  Assessment: Reviewed RN Assessment  Last BM:  01/24/21  Height:   Ht Readings from Last 1 Encounters:  01/20/21 5' 4"  (1.626 m)    Weight:   Wt Readings from Last 1 Encounters:  01/25/21 39.3 kg   BMI:  Body mass index is 14.86 kg/m.  Estimated Nutritional Needs:   Kcal:  1550-1750  Protein:  75-90 grams  Fluid:  >1.8 L    Loistine Chance, RD, LDN, [place] Registered Dietitian II Certified Diabetes Care and Education Specialist Please refer to [facility] for RD and/or RD on-call/weekend/after hours pager

## 2021-01-25 NOTE — Consult Note (Signed)
[facility] CM Inpatient Consult   01/25/2021  [name] 1976/01/14 [id number]  [facility] [[facility]]  Accountable Care Organization [ACO] Patient: [name] [facility] PPO  Patient screened for hospitalization with noted high risk score for unplanned readmission risk and  to assess for potential [facility] Customer [facility] manager  [[facility]] Care Management service needs for post hospital transition.    Plan:  Continue to follow progress with inpatient [facility] team and disposition to assess for post hospital care management needs.    For questions contact:   Charlesetta Shanks, RN BSN CCM [facility]  [phone] business mobile phone Toll free office [phone]  Fax number: [phone] [email or web address]

## 2021-01-25 NOTE — Progress Notes (Signed)
Modified Barium Swallow Progress Note  Patient Details  Name: [name] MRN: [id number] Date of Birth: 1975/08/29  Today's Date: 01/25/2021  Modified Barium Swallow completed.  Full report located under Chart Review in the Imaging Section.  Brief recommendations include the following:  Clinical Impression  Pt was seen for an MBS. Overall, pt presents with functional oral and pharyngeal swallow across all consistencies. Pt reported chronic nausea and difficulty keeping food down for two years, exhibiting hesitancy to swallow barium d/t fear of emesis. SLP provided encouragement and compensatory strategies for tolerating barium (small sips, quick oral phase, etc.), and provided pt with emesis bag to elicit relaxation. Pt tolerated small sips and bites of thin liquid, puree, mechanical soft, and regular solids exhibiting no oral phase or pharyngeal phase impairments. Pt did report moderate odynophagia and globus sensation in throat after presentations of solids and SLP showed pt evidence of clear imaging exhibiting no pharyngeal residue. MBS does not diagnose below the level of the UES however scan revealed what appeared to be timely transit to stomach without residue. Pt needs no acute SLP follow up. Recommend continuing Dys1/thin liquid diet d/t pt discomfort re: swallowing solids.   Swallow Evaluation Recommendations       SLP Diet Recommendations: Dysphagia 1 (Puree) solids;Thin liquid   Liquid Administration via: Cup;Straw   Medication Administration: Crushed with puree   Supervision: Patient able to self feed   Compensations: Small sips/bites   Postural Changes: Remain semi-upright after after feeds/meals (Comment);Seated upright at 90 degrees   Oral Care Recommendations: Oral care BID        Houston Siren 01/25/2021,4:17 PM

## 2021-01-25 NOTE — Progress Notes (Signed)
Speech Language Pathology Treatment: Dysphagia  Patient Details Name: GAYNOR GENCO MRN: [id number] DOB: [birth year] Today's Date: 01/25/2021 Time: 3568-6168 SLP Time Calculation (min) (ACUTE ONLY): 20 min  Assessment / Plan / Recommendation Clinical Impression  Pt and family seen at bedside for education regarding MBS results and recommendations. Pt reporting significant ear and throat pain during swallowing s/p NGT procedure, increasing with swallowing of regular solids.Pt/family  verbalized understanding. GI MD arrived and discussed results. Plan is to remove NGT and place PEG tube to receive nutrition while working on increasing oral intake. Pt currently on Dys1/thin liquid diet d/t odynophagia, but can safely increase to regular per pt comfort level. Pt requires no further acute therapy from SLP at this time.    HPI HPI: SAUL DORSI is a 45 y.o. female who presented with complaints abdominal pain with nausea and vomiting over the last 2 weeks. Pt has had significant weight loss.  She reports difficulty going back 2 years with worsening in past 2 months resulting in inability to eat any solid foods.  Lower lungs clear on Ab CT 10/13.  Pt has been seen by GI in the past and has had multiple normal EGDs. Most recent in [facility] documentation 06/2020 was negative  except for Candida esophagitis. Per chart she has had multiple days without BM at times, but that this is not normal for her.  She reports hx of IBS.  Pt with medical history significant of hypertension, hyperlipidemia, chronic interstitial cystitis, COPD, pancreatitis, bipolar disorder, tobacco abuse, anxiety, and depression.      SLP Plan  All goals met      Recommendations for follow up therapy are one component of a multi-disciplinary discharge planning process, led by the attending physician.  Recommendations may be updated based on patient status, additional functional criteria and insurance authorization.    Recommendations  Diet  recommendations: Dysphagia 1 (puree);Thin liquid Liquids provided via: Cup;Straw Medication Administration: Crushed with puree Supervision: Patient able to self feed Compensations: Small sips/bites Postural Changes and/or Swallow Maneuvers: Seated upright 90 degrees;Upright 30-60 min after meal                Oral Care Recommendations: Oral care BID Follow up Recommendations: None SLP Visit Diagnosis: Dysphagia, unspecified (R13.10) Plan: All goals met       GO              Dewitt Rota, SLP-Student   Dewitt Rota  01/25/2021, 4:24 PM

## 2021-01-25 NOTE — Progress Notes (Addendum)
     Progress Note    ASSESSMENT AND PLAN:   Failure to thrive with weight loss Several likely functional GI complaints.  Neg MBS for any oropharyngeal dysphagia.  She had negative EGDs in the past. Associated bipolar disorder, COPD, fibromyalgia, IBS Interstitial cystitis  Plan: -No need for barium swallow or rpt EGD. -Can proceed directly to PEG for now. -May have to arrange home health care for PEG feeds and teaching. -If any further N/V despite PEG, can consider PEJ.  I doubt if she needs it. -I do recommend close follow-up with psychiatry. -I discussed extensively with the patient, patient's husband and patient's mother. -She will follow-up with Dr Maylon Peppers as outpatient -Will sign off for now.     SUBJECTIVE   Does feel better.   Complains about NG tube-about neck and ears hurting. Wants to get PEG tube as soon as possible-so as to get NG tube out. Modified barium swallow was negative. No nausea/vomiting.  Tolerating NG feeds without any problems.   OBJECTIVE:     Vital signs in last 24 hours: Temp:  [97.9 F (36.6 C)-98.1 F (36.7 C)] 98.1 F (36.7 C) (10/18 0425) Pulse Rate:  [78-86] 79 (10/18 1143) Resp:  [18-20] 18 (10/18 1143) BP: (102-122)/(74-83) 102/78 (10/18 1143) SpO2:  [95 %-100 %] 100 % (10/18 1143) Weight:  [39.3 kg] 39.3 kg (10/18 0425) Last BM Date: 01/24/21 General:   Alert, female in NAD Abdomen:  Soft, nondistended, nontender.  Normal bowel sounds,.       Neurologic:  Alert and  oriented x4;  grossly normal neurologically. Psych:  Pleasant, cooperative.  Normal mood and affect.   Intake/Output from previous day: 10/17 0701 - 10/18 0700 In: 1659.4 [I.V.:3; NG/GT:1530.9; IV Piggyback:125.4] Out: 1300 [Urine:1300] Intake/Output this shift: No intake/output data recorded.  Lab Results: Recent Labs    01/23/21 0403 01/24/21 0407 01/25/21 0339  WBC 10.4 6.8 6.4  HGB 13.7 12.1 11.8*  HCT 43.7 35.9* 35.6*  PLT 208 323 302    BMET Recent Labs    01/23/21 0403 01/24/21 0407 01/25/21 0339  NA 141 139 139  K 3.9 4.1 4.1  CL 100 106 103  CO2 33* 26 29  GLUCOSE 113* 108* 90  BUN 15 5* 7  CREATININE 0.57 0.70 0.72  CALCIUM 9.0 9.7 9.6   LFT Recent Labs    01/25/21 0339  PROT 6.3*  ALBUMIN 3.5  AST 34  ALT 12  ALKPHOS 63  BILITOT 0.3   PT/INR No results for input(s): LABPROT, INR in the last 72 hours. Hepatitis Panel No results for input(s): HEPBSAG, HCVAB, HEPAIGM, HEPBIGM in the last 72 hours.  DG Abd 1 View  Result Date: 01/24/2021 CLINICAL DATA:  NG tube placement EXAM: ABDOMEN - 1 VIEW COMPARISON:  None. FINDINGS: Enteric tube terminates in the gastric cardia. Nonobstructive bowel gas pattern. Lung bases are clear. IMPRESSION: Enteric tube terminates in the gastric cardia. Electronically Signed   By: Julian Hy M.D.   On: 01/24/2021 03:30     Principal Problem:   UTI (urinary tract infection) Active Problems:   SMOKER   Bipolar disorder (HCC)   BRONCHITIS, RECURRENT   Chronic interstitial cystitis   Thrombocytosis   Elevated lipase   Hypokalemia   Fibromyalgia   History of pancreatitis     LOS: 4 days     Carmell Austria, MD 01/25/2021, 3:05 PM [facility] [phone]

## 2021-01-25 NOTE — Progress Notes (Signed)
Patient ID: [name], female   DOB: 03-01-1976, 45 y.o.   MRN: [id number]  PROGRESS NOTE    [name]  PPI:[id number] DOB: Aug 25, 1975 DOA: 01/20/2021 PCP: Neale Burly, MD   Brief Narrative:  45 y.o. female with medical history significant of HTN, HLD, chronic interstitial cystitis, COPD, pancreatitis, bipolar disorder, tobacco abuse, anxiety, and depression presented with worsening abdominal pain, nausea and vomiting.  Of note she is scheduled to undergo cystoscopy with hydrodistention/Botox injection procedure with Dr. Amalia Hailey of urology on 01/28/2021.  On presentation, CT scan of the abdomen and pelvis was negative for acute intra-abdominal/intrapelvic abnormality.  NG tube was placed for nutrition.  GI was consulted.  Assessment & Plan:   E. coli urinary tract infection: Present on admission History of chronic interstitial cystitis -Urine culture growing E. coli.  Currently on Rocephin.  Switched to amoxicillin to finish total of 7-day course of antibiotics -Patient is supposed to undergo cystoscopy with hydrodistention/Botox injection procedure with Dr. Amalia Hailey of urology on 01/28/2021.  Outpatient follow-up with urology.  Nausea and vomiting/dysphagia Severe malnutrition -Currently has NG tube for nutrition.  GI and SLP following. -Patient is complaining of ear pain after NG tube placement. -She has lost a lot of weight over the last 2 years and it is unclear if her oral intake will improve with NG feeding alone.  She probably will end up needing a feeding tube.  Follow GI and SLP recommendations. -Follow dietary recommendations  History of chronic pancreatitis -CT of the abdomen was unremarkable. -GI following  Hypoglycemia -Improved.  Continue tube feeding diet  Bipolar disorder -PTA meds: Klonopin 1 tablet 3 times daily, mirtazapine 15 mg nightly, Trintellix 10 mg daily, and Seroquel 400 mg nightly Followed by Dr. Erling Cruz of psychiatry at [facility]. Per review of records she was  last seen on 11/24/2020 with a note verifying mirtazapine and Klonopin, Trintellix 10 mg daily.   Continue Klonopin, mirtazapine,Trintellix, and Seroquel -anxiety appears to be playing a role with her symptoms  Chronic pain/fibromyalgia -Outpatient follow-up with pain management.  Continue as needed Dilaudid/oxycodone.  Tobacco abuse -Counseled regarding cessation by prior hospitalist.    DVT prophylaxis: Lovenox Code Status: Full Family Communication: Husband at bedside Disposition Plan: Status is: Inpatient  Remains inpatient appropriate because: Severe malnutrition/still has NG tube  Consultants: GI  Procedures: None  Antimicrobials: None   Subjective: Patient seen and examined at bedside.  Complains of bilateral ear pain and some throat pain.  Wants to try oral food as well.  No overnight fever, no chest pain or shortness of breath reported.  Objective: Vitals:   01/24/21 1958 01/24/21 2002 01/25/21 0425 01/25/21 0831  BP: 122/83  105/74   Pulse: 78 78 86   Resp: 18 18 20    Temp: 97.9 F (36.6 C)  98.1 F (36.7 C)   TempSrc: Oral  Oral   SpO2: 98% 98% 98% 95%  Weight:   39.3 kg   Height:        Intake/Output Summary (Last 24 hours) at 01/25/2021 1031 Last data filed at 01/25/2021 0545 Gross per 24 hour  Intake 1659.36 ml  Output 1300 ml  Net 359.36 ml   Filed Weights   01/23/21 0543 01/24/21 0500 01/25/21 0425  Weight: 41.2 kg 39 kg 39.3 kg    Examination:  General exam: Appears calm and comfortable.  Looks chronically ill and deconditioned.  Extremity limited.  Currently on room air. ENT: NG tube present. Respiratory system: Bilateral decreased breath sounds at  bases Cardiovascular system: S1 & S2 heard, Rate controlled Gastrointestinal system: Abdomen is nondistended, soft and nontender. Normal bowel sounds heard. Extremities: No cyanosis, clubbing, edema  Central nervous system: Alert and oriented. No focal neurological deficits. Moving  extremities Skin: No rashes, lesions or ulcers Psychiatry: Affect is flat.  Intermittently gets anxious.   Data Reviewed: I have personally reviewed following labs and imaging studies  CBC: Recent Labs  Lab 01/20/21 1942 01/22/21 0427 01/23/21 0403 01/24/21 0407 01/25/21 0339  WBC 9.6 6.5 10.4 6.8 6.4  NEUTROABS 5.6  --  7.6 2.9 2.4  HGB 12.9 12.0 13.7 12.1 11.8*  HCT 37.4 35.6* 43.7 35.9* 35.6*  MCV 93.3 93.9 92.2 95.0 96.2  PLT 439* 303 208 323 283   Basic Metabolic Panel: Recent Labs  Lab 01/20/21 1942 01/21/21 1445 01/21/21 1822 01/22/21 0427 01/23/21 0403 01/24/21 0407 01/25/21 0339  NA 137  --   --  139 141 139 139  K 3.1*  --   --  4.0 3.9 4.1 4.1  CL 106  --   --  109 100 106 103  CO2 23  --   --  22 33* 26 29  GLUCOSE 107*  --   --  56* 113* 108* 90  BUN 6  --   --  <5* 15 5* 7  CREATININE 0.63  --   --  0.72 0.57 0.70 0.72  CALCIUM 9.0  --   --  8.8* 9.0 9.7 9.6  MG  --  1.8 1.8 1.9 2.1  --   --   PHOS  --  3.0 2.8 3.4 4.4  --   --    GFR: Estimated Creatinine Clearance: 55.7 mL/min (by C-G formula based on SCr of 0.72 mg/dL). Liver Function Tests: Recent Labs  Lab 01/20/21 1942 01/22/21 0427 01/23/21 0403 01/24/21 0407 01/25/21 0339  AST 19 17 18 16  34  ALT 26 21 21 15 12   ALKPHOS 79 56 92 65 63  BILITOT 0.3 0.8 0.4 0.3 0.3  PROT 7.5 6.1* 6.1* 6.3* 6.3*  ALBUMIN 4.4 3.4* 3.3* 3.5 3.5   Recent Labs  Lab 01/19/21 0226 01/20/21 1942 01/23/21 0403  LIPASE 25 53* 24   No results for input(s): AMMONIA in the last 168 hours. Coagulation Profile: No results for input(s): INR, PROTIME in the last 168 hours. Cardiac Enzymes: No results for input(s): CKTOTAL, CKMB, CKMBINDEX, TROPONINI in the last 168 hours. BNP (last 3 results) No results for input(s): PROBNP in the last 8760 hours. HbA1C: No results for input(s): HGBA1C in the last 72 hours. CBG: Recent Labs  Lab 01/24/21 0357 01/24/21 1152 01/24/21 1635 01/24/21 2115 01/25/21 0502   GLUCAP 119* 102* 94 90 113*   Lipid Profile: No results for input(s): CHOL, HDL, LDLCALC, TRIG, CHOLHDL, LDLDIRECT in the last 72 hours. Thyroid Function Tests: No results for input(s): TSH, T4TOTAL, FREET4, T3FREE, THYROIDAB in the last 72 hours. Anemia Panel: No results for input(s): VITAMINB12, FOLATE, FERRITIN, TIBC, IRON, RETICCTPCT in the last 72 hours. Sepsis Labs: No results for input(s): PROCALCITON, LATICACIDVEN in the last 168 hours.  Recent Results (from the past 240 hour(s))  Resp Panel by RT-PCR (Flu A&B, Covid) Nasopharyngeal Swab     Status: None   Collection Time: 01/21/21  4:31 AM   Specimen: Nasopharyngeal Swab; Nasopharyngeal(NP) swabs in vial transport medium  Result Value Ref Range Status   SARS Coronavirus 2 by RT PCR NEGATIVE NEGATIVE Final    Comment: (NOTE) SARS-CoV-2 target nucleic acids  are NOT DETECTED.  The SARS-CoV-2 RNA is generally detectable in upper respiratory specimens during the acute phase of infection. The lowest concentration of SARS-CoV-2 viral copies this assay can detect is 138 copies/mL. A negative result does not preclude SARS-Cov-2 infection and should not be used as the sole basis for treatment or other patient management decisions. A negative result may occur with  improper specimen collection/handling, submission of specimen other than nasopharyngeal swab, presence of viral mutation(s) within the areas targeted by this assay, and inadequate number of viral copies(<138 copies/mL). A negative result must be combined with clinical observations, patient history, and epidemiological information. The expected result is Negative.  Fact Sheet for Patients:  EntrepreneurPulse.com.au  Fact Sheet for Healthcare Providers:  IncredibleEmployment.be  This test is no t yet approved or cleared by the [place] FDA and  has been authorized for detection and/or diagnosis of SARS-CoV-2 by FDA under an  Emergency Use Authorization (EUA). This EUA will remain  in effect (meaning this test can be used) for the duration of the COVID-19 declaration under Section 564(b)(1) of the Act, 21 U.S.C.section 360bbb-3(b)(1), unless the authorization is terminated  or revoked sooner.       Influenza A by PCR NEGATIVE NEGATIVE Final   Influenza B by PCR NEGATIVE NEGATIVE Final    Comment: (NOTE) The Xpert Xpress SARS-CoV-2/FLU/RSV plus assay is intended as an aid in the diagnosis of influenza from Nasopharyngeal swab specimens and should not be used as a sole basis for treatment. Nasal washings and aspirates are unacceptable for Xpert Xpress SARS-CoV-2/FLU/RSV testing.  Fact Sheet for Patients: EntrepreneurPulse.com.au  Fact Sheet for Healthcare Providers: IncredibleEmployment.be  This test is not yet approved or cleared by the [place] FDA and has been authorized for detection and/or diagnosis of SARS-CoV-2 by FDA under an Emergency Use Authorization (EUA). This EUA will remain in effect (meaning this test can be used) for the duration of the COVID-19 declaration under Section 564(b)(1) of the Act, 21 U.S.C. section 360bbb-3(b)(1), unless the authorization is terminated or revoked.  Performed at [facility], [address] [place], [place] [address]   Urine Culture     Status: Abnormal   Collection Time: 01/21/21  9:45 AM   Specimen: Urine, Catheterized  Result Value Ref Range Status   Specimen Description URINE, CATHETERIZED  Final   Special Requests   Final    NONE Performed at [facility], [address] [place], [place] [address]    Culture >=100,000 COLONIES/mL ESCHERICHIA COLI (A)  Final   Report Status 01/24/2021 FINAL  Final   Organism ID, Bacteria ESCHERICHIA COLI (A)  Final      Susceptibility   Escherichia coli - MIC*    AMPICILLIN <=2 SENSITIVE Sensitive     CEFAZOLIN <=4 SENSITIVE Sensitive     CEFEPIME  <=0.12 SENSITIVE Sensitive     CEFTRIAXONE <=0.25 SENSITIVE Sensitive     CIPROFLOXACIN <=0.25 SENSITIVE Sensitive     GENTAMICIN 4 SENSITIVE Sensitive     IMIPENEM <=0.25 SENSITIVE Sensitive     NITROFURANTOIN <=16 SENSITIVE Sensitive     TRIMETH/SULFA <=20 SENSITIVE Sensitive     AMPICILLIN/SULBACTAM <=2 SENSITIVE Sensitive     PIP/TAZO <=4 SENSITIVE Sensitive     * >=100,000 COLONIES/mL ESCHERICHIA COLI         Radiology Studies: DG Abd 1 View  Result Date: 01/24/2021 CLINICAL DATA:  NG tube placement EXAM: ABDOMEN - 1 VIEW COMPARISON:  None. FINDINGS: Enteric tube terminates  in the gastric cardia. Nonobstructive bowel gas pattern. Lung bases are clear. IMPRESSION: Enteric tube terminates in the gastric cardia. Electronically Signed   By: Julian Hy M.D.   On: 01/24/2021 03:30        Scheduled Meds:  arformoterol  15 mcg Nebulization BID   budesonide (PULMICORT) nebulizer solution  0.5 mg Nebulization BID   Chlorhexidine Gluconate Cloth  6 each Topical Daily   cycloSPORINE  1 drop Both Eyes BID   enoxaparin (LOVENOX) injection  30 mg Subcutaneous Q24H   estradiol  2 mg Oral Daily   fluticasone  2 spray Each Nare Daily   free water  100 mL Per Tube Q4H   gabapentin  800 mg Oral QID   guaiFENesin  600 mg Oral BID   hydrOXYzine  50 mg Oral QHS   mirtazapine  15 mg Oral QHS   neomycin-polymyxin-hydrocortisone  3 drop Both EARS Q8H   nicotine  21 mg Transdermal Daily   oxybutynin  15 mg Oral Daily   pantoprazole (PROTONIX) IV  40 mg Intravenous Q12H   QUEtiapine  400 mg Oral QHS   sodium chloride flush  3 mL Intravenous Q12H   vortioxetine HBr  10 mg Oral Daily   Continuous Infusions:  cefTRIAXone (ROCEPHIN)  IV 1 g (01/25/21 1029)   feeding supplement (OSMOLITE 1.2 CAL) 1,000 mL (01/24/21 1611)   promethazine (PHENERGAN) injection (IM or IVPB) 12.5 mg (01/24/21 0824)          Aline August, MD [facility] 01/25/2021, 10:31 AM

## 2021-01-26 DIAGNOSIS — M797 Fibromyalgia: Secondary | ICD-10-CM | POA: Diagnosis not present

## 2021-01-26 DIAGNOSIS — N390 Urinary tract infection, site not specified: Secondary | ICD-10-CM | POA: Diagnosis not present

## 2021-01-26 DIAGNOSIS — N3010 Interstitial cystitis (chronic) without hematuria: Secondary | ICD-10-CM | POA: Diagnosis not present

## 2021-01-26 DIAGNOSIS — F319 Bipolar disorder, unspecified: Secondary | ICD-10-CM | POA: Diagnosis not present

## 2021-01-26 LAB — COMPREHENSIVE METABOLIC PANEL
ALT: 12 U/L (ref 0–44)
AST: 15 U/L (ref 15–41)
Albumin: 3.6 g/dL (ref 3.5–5.0)
Alkaline Phosphatase: 66 U/L (ref 38–126)
Anion gap: 9 (ref 5–15)
BUN: 8 mg/dL (ref 6–20)
CO2: 27 mmol/L (ref 22–32)
Calcium: 9.2 mg/dL (ref 8.9–10.3)
Chloride: 100 mmol/L (ref 98–111)
Creatinine, Ser: 0.7 mg/dL (ref 0.44–1.00)
GFR, Estimated: >60 mL/min (ref >60)
Glucose, Bld: 129 mg/dL — ABNORMAL HIGH (ref 70–99)
Potassium: 4 mmol/L (ref 3.5–5.1)
Sodium: 136 mmol/L (ref 135–145)
Total Bilirubin: 0.3 mg/dL (ref 0.3–1.2)
Total Protein: 6.4 g/dL — ABNORMAL LOW (ref 6.5–8.1)

## 2021-01-26 LAB — CBC WITH DIFFERENTIAL/PLATELET
Abs Immature Granulocytes: 0.01 10*3/uL (ref 0.00–0.07)
Basophils Absolute: 0.1 10*3/uL (ref 0.0–0.1)
Basophils Relative: 1 %
Eosinophils Absolute: 0.3 10*3/uL (ref 0.0–0.5)
Eosinophils Relative: 7 %
HCT: 35.5 % — ABNORMAL LOW (ref 36.0–46.0)
Hemoglobin: 11.9 g/dL — ABNORMAL LOW (ref 12.0–15.0)
Immature Granulocytes: 0 %
Lymphocytes Relative: 50 %
Lymphs Abs: 2.6 10*3/uL (ref 0.7–4.0)
MCH: 31.9 pg (ref 26.0–34.0)
MCHC: 33.5 g/dL (ref 30.0–36.0)
MCV: 95.2 fL (ref 80.0–100.0)
Monocytes Absolute: 0.4 10*3/uL (ref 0.1–1.0)
Monocytes Relative: 8 %
Neutro Abs: 1.8 10*3/uL (ref 1.7–7.7)
Neutrophils Relative %: 34 %
Platelets: 313 10*3/uL (ref 150–400)
RBC: 3.73 MIL/uL — ABNORMAL LOW (ref 3.87–5.11)
RDW: 12.5 % (ref 11.5–15.5)
WBC: 5.2 10*3/uL (ref 4.0–10.5)
nRBC: 0 % (ref 0.0–0.2)

## 2021-01-26 LAB — GLUCOSE, CAPILLARY
Glucose-Capillary: 130 mg/dL — ABNORMAL HIGH (ref 70–99)
Glucose-Capillary: 95 mg/dL (ref 70–99)
Glucose-Capillary: 120 mg/dL — ABNORMAL HIGH (ref 70–99)
Glucose-Capillary: 107 mg/dL — ABNORMAL HIGH (ref 70–99)
Glucose-Capillary: 105 mg/dL — ABNORMAL HIGH (ref 70–99)

## 2021-01-26 MED ORDER — ENOXAPARIN SODIUM 30 MG/0.3ML IJ SOSY
30.0000 mg | PREFILLED_SYRINGE | INTRAMUSCULAR | Status: DC
  Administered 2021-01-28 – 2021-01-31 (×4): 30 mg via SUBCUTANEOUS
  Filled 2021-01-26 (×5): qty 0.3

## 2021-01-26 NOTE — Progress Notes (Signed)
Patient ID: [name], female   DOB: [birth year], 45 y.o.   MRN: [id number]  PROGRESS NOTE    SHANERA MESKE  VCB:[id number] DOB: 1975-09-11 DOA: 01/20/2021 PCP: Neale Burly, MD   Brief Narrative:  45 y.o. female with medical history significant of HTN, HLD, chronic interstitial cystitis, COPD, pancreatitis, bipolar disorder, tobacco abuse, anxiety, and depression presented with worsening abdominal pain, nausea and vomiting.  Of note she is scheduled to undergo cystoscopy with hydrodistention/Botox injection procedure with Dr. Amalia Hailey of urology on 01/28/2021.  On presentation, CT scan of the abdomen and pelvis was negative for acute intra-abdominal/intrapelvic abnormality.  NG tube was placed for nutrition.  GI was consulted.  Assessment & Plan:   E. coli urinary tract infection: Present on admission History of chronic interstitial cystitis -Urine culture growing E. coli.  Currently on Rocephin.  Switched to amoxicillin to finish total of 7-day course of antibiotics -Patient is supposed to undergo cystoscopy with hydrodistention/Botox injection procedure with Dr. Amalia Hailey of urology on 01/28/2021.  This will have to be rescheduled.  Outpatient follow-up with urology.  Nausea and vomiting/dysphagia Severe malnutrition -Currently has NG tube for nutrition.  GI and SLP following. -Patient is complaining of ear pain after NG tube placement. -She has lost a lot of weight over the last 2 years and it is unclear if her oral intake will improve with NG feeding alone.   -MBS was negative for any oropharyngeal dysphagia.  GI recommended no need for barium swallow or repeat EGD and recommended to proceed directly to PEG tube placement by IR.  Patient is agreeable for PEG tube placement.  IR has been consulted.   -Follow dietary recommendations  History of chronic pancreatitis -CT of the abdomen was unremarkable. -GI following  Hypoglycemia -Improved.  Continue tube feeding diet  Bipolar  disorder -PTA meds: Klonopin 1 tablet 3 times daily, mirtazapine 15 mg nightly, Trintellix 10 mg daily, and Seroquel 400 mg nightly Followed by Dr. Erling Cruz of psychiatry at [facility]. Per review of records she was last seen on 11/24/2020 with a note verifying mirtazapine and Klonopin, Trintellix 10 mg daily.   Continue Klonopin, mirtazapine,Trintellix, and Seroquel -anxiety appears to be playing a role with her symptoms  Chronic pain/fibromyalgia -Outpatient follow-up with pain management.  Continue as needed Dilaudid/oxycodone.  Tobacco abuse -Counseled regarding cessation by prior hospitalist.    DVT prophylaxis: Lovenox Code Status: Full Family Communication: Husband at bedside on 01/25/2021 Disposition Plan: Status is: Inpatient  Remains inpatient appropriate because: Severe malnutrition/still has NG tube.  Will need PEG tube placement  Consultants: GI/IR  Procedures: None  Antimicrobials: None   Subjective: Patient seen and examined at bedside.  No overnight fever, worsening shortness of breath or chest pain reported.  Still complains of bilateral ear pain.  Objective: Vitals:   01/25/21 0831 01/25/21 1143 01/25/21 2003 01/26/21 0522  BP:  102/78 (!) 126/105 98/64  Pulse:  79 87 99  Resp:  18 18 18   Temp:   (!) 97.5 F (36.4 C) 97.9 F (36.6 C)  TempSrc:   Oral Oral  SpO2: 95% 100% 98% 95%  Weight:      Height:        Intake/Output Summary (Last 24 hours) at 01/26/2021 0726 Last data filed at 01/26/2021 0557 Gross per 24 hour  Intake 250 ml  Output 400 ml  Net -150 ml    Filed Weights   01/23/21 0543 01/24/21 0500 01/25/21 0425  Weight: 41.2 kg 39 kg 39.3 kg  Examination:  General exam: No distress.  Still on room air.  Extremely thinly built.  Looks chronically ill and deconditioned.   ENT: Still has NG tube Respiratory system: Decreased breath sounds at bases bilaterally Cardiovascular system: Rate controlled, S1-S2 heard gastrointestinal system: Abdomen  is distended slightly, soft and nontender.  Bowel sounds are heard Extremities: No edema or clubbing  Central nervous system: Awake and alert.  No focal neurological deficits.  Moves extremities Skin: No obvious ecchymosis/lesions  psychiatry: Anxious looking  Data Reviewed: I have personally reviewed following labs and imaging studies  CBC: Recent Labs  Lab 01/20/21 1942 01/22/21 0427 01/23/21 0403 01/24/21 0407 01/25/21 0339 01/26/21 0359  WBC 9.6 6.5 10.4 6.8 6.4 5.2  NEUTROABS 5.6  --  7.6 2.9 2.4 1.8  HGB 12.9 12.0 13.7 12.1 11.8* 11.9*  HCT 37.4 35.6* 43.7 35.9* 35.6* 35.5*  MCV 93.3 93.9 92.2 95.0 96.2 95.2  PLT 439* 303 208 323 302 588    Basic Metabolic Panel: Recent Labs  Lab 01/21/21 1445 01/21/21 1822 01/22/21 0427 01/23/21 0403 01/24/21 0407 01/25/21 0339 01/26/21 0359  NA  --   --  139 141 139 139 136  K  --   --  4.0 3.9 4.1 4.1 4.0  CL  --   --  109 100 106 103 100  CO2  --   --  22 33* 26 29 27   GLUCOSE  --   --  56* 113* 108* 90 129*  BUN  --   --  <5* 15 5* 7 8  CREATININE  --   --  0.72 0.57 0.70 0.72 0.70  CALCIUM  --   --  8.8* 9.0 9.7 9.6 9.2  MG 1.8 1.8 1.9 2.1  --   --   --   PHOS 3.0 2.8 3.4 4.4  --   --   --     GFR: Estimated Creatinine Clearance: 55.7 mL/min (by C-G formula based on SCr of 0.7 mg/dL). Liver Function Tests: Recent Labs  Lab 01/22/21 0427 01/23/21 0403 01/24/21 0407 01/25/21 0339 01/26/21 0359  AST 17 18 16  34 15  ALT 21 21 15 12 12   ALKPHOS 56 92 65 63 66  BILITOT 0.8 0.4 0.3 0.3 0.3  PROT 6.1* 6.1* 6.3* 6.3* 6.4*  ALBUMIN 3.4* 3.3* 3.5 3.5 3.6    Recent Labs  Lab 01/20/21 1942 01/23/21 0403  LIPASE 53* 24    No results for input(s): AMMONIA in the last 168 hours. Coagulation Profile: No results for input(s): INR, PROTIME in the last 168 hours. Cardiac Enzymes: No results for input(s): CKTOTAL, CKMB, CKMBINDEX, TROPONINI in the last 168 hours. BNP (last 3 results) No results for input(s): PROBNP  in the last 8760 hours. HbA1C: No results for input(s): HGBA1C in the last 72 hours. CBG: Recent Labs  Lab 01/25/21 1141 01/25/21 1621 01/25/21 2000 01/25/21 2356 01/26/21 0402  GLUCAP 82 106* 128* 137* 130*    Lipid Profile: No results for input(s): CHOL, HDL, LDLCALC, TRIG, CHOLHDL, LDLDIRECT in the last 72 hours. Thyroid Function Tests: No results for input(s): TSH, T4TOTAL, FREET4, T3FREE, THYROIDAB in the last 72 hours. Anemia Panel: No results for input(s): VITAMINB12, FOLATE, FERRITIN, TIBC, IRON, RETICCTPCT in the last 72 hours. Sepsis Labs: No results for input(s): PROCALCITON, LATICACIDVEN in the last 168 hours.  Recent Results (from the past 240 hour(s))  Resp Panel by RT-PCR (Flu A&B, Covid) Nasopharyngeal Swab     Status: None   Collection Time: 01/21/21  4:31 AM   Specimen: Nasopharyngeal Swab; Nasopharyngeal(NP) swabs in vial transport medium  Result Value Ref Range Status   SARS Coronavirus 2 by RT PCR NEGATIVE NEGATIVE Final    Comment: (NOTE) SARS-CoV-2 target nucleic acids are NOT DETECTED.  The SARS-CoV-2 RNA is generally detectable in upper respiratory specimens during the acute phase of infection. The lowest concentration of SARS-CoV-2 viral copies this assay can detect is 138 copies/mL. A negative result does not preclude SARS-Cov-2 infection and should not be used as the sole basis for treatment or other patient management decisions. A negative result may occur with  improper specimen collection/handling, submission of specimen other than nasopharyngeal swab, presence of viral mutation(s) within the areas targeted by this assay, and inadequate number of viral copies(<138 copies/mL). A negative result must be combined with clinical observations, patient history, and epidemiological information. The expected result is Negative.  Fact Sheet for Patients:  EntrepreneurPulse.com.au  Fact Sheet for Healthcare Providers:   IncredibleEmployment.be  This test is no t yet approved or cleared by the [place] FDA and  has been authorized for detection and/or diagnosis of SARS-CoV-2 by FDA under an Emergency Use Authorization (EUA). This EUA will remain  in effect (meaning this test can be used) for the duration of the COVID-19 declaration under Section 564(b)(1) of the Act, 21 U.S.C.section 360bbb-3(b)(1), unless the authorization is terminated  or revoked sooner.       Influenza A by PCR NEGATIVE NEGATIVE Final   Influenza B by PCR NEGATIVE NEGATIVE Final    Comment: (NOTE) The Xpert Xpress SARS-CoV-2/FLU/RSV plus assay is intended as an aid in the diagnosis of influenza from Nasopharyngeal swab specimens and should not be used as a sole basis for treatment. Nasal washings and aspirates are unacceptable for Xpert Xpress SARS-CoV-2/FLU/RSV testing.  Fact Sheet for Patients: EntrepreneurPulse.com.au  Fact Sheet for Healthcare Providers: IncredibleEmployment.be  This test is not yet approved or cleared by the [place] FDA and has been authorized for detection and/or diagnosis of SARS-CoV-2 by FDA under an Emergency Use Authorization (EUA). This EUA will remain in effect (meaning this test can be used) for the duration of the COVID-19 declaration under Section 564(b)(1) of the Act, 21 U.S.C. section 360bbb-3(b)(1), unless the authorization is terminated or revoked.  Performed at [facility], [address] [place], [place] [address]   Urine Culture     Status: Abnormal   Collection Time: 01/21/21  9:45 AM   Specimen: Urine, Catheterized  Result Value Ref Range Status   Specimen Description URINE, CATHETERIZED  Final   Special Requests   Final    NONE Performed at [facility], [address] [place], [place] [address]    Culture >=100,000 COLONIES/mL ESCHERICHIA COLI (A)  Final   Report Status 01/24/2021 FINAL   Final   Organism ID, Bacteria ESCHERICHIA COLI (A)  Final      Susceptibility   Escherichia coli - MIC*    AMPICILLIN <=2 SENSITIVE Sensitive     CEFAZOLIN <=4 SENSITIVE Sensitive     CEFEPIME <=0.12 SENSITIVE Sensitive     CEFTRIAXONE <=0.25 SENSITIVE Sensitive     CIPROFLOXACIN <=0.25 SENSITIVE Sensitive     GENTAMICIN 4 SENSITIVE Sensitive     IMIPENEM <=0.25 SENSITIVE Sensitive     NITROFURANTOIN <=16 SENSITIVE Sensitive     TRIMETH/SULFA <=20 SENSITIVE Sensitive     AMPICILLIN/SULBACTAM <=2 SENSITIVE Sensitive     PIP/TAZO <=4 SENSITIVE Sensitive     * >=100,000  COLONIES/mL ESCHERICHIA COLI          Radiology Studies: DG Swallowing Func-Speech Pathology  Result Date: 01/25/2021 Table formatting from the original result was not included. Objective Swallowing Evaluation: Type of Study: MBS-Modified Barium Swallow Study  Patient Details Name: PECOLA HAXTON MRN: [id number] Date of Birth: 17-Feb-1976 Today's Date: 01/25/2021 Time: SLP Start Time (ACUTE ONLY): 1315 -SLP Stop Time (ACUTE ONLY): 3382 SLP Time Calculation (min) (ACUTE ONLY): 32 min Past Medical History: Past Medical History: Diagnosis Date  Anal fissure 03/11/2009  Anemia   Anxiety and depression   ARTHRITIS 08/31/2007  Asthma   BENZODIAZEPINE ADDICTION 08/31/2007  Bipolar 1 disorder ([place])   Bowel obstruction (HCC)   Breakdown of urinary electronic stimulator device, init ([place])   BRONCHITIS, RECURRENT 08/23/2009  Asthmatic Bronchitis-Dr. Melvyn Novas.....-HFA 75% 12/04/2008>75% 02/05/2009>75% 08/04/2009 -PFT's 01/04/2009 2.56 (86%) ratio 75, no resp to B2 and DLC0 67% > 80 after correction   Cancer (HCC)   cervical cancer  Chronic interstitial cystitis 03/11/2009  Chronic nausea   Chronic pain   COLONIC POLYPS, HX OF 07/25/2006  ADENOMATOUS POLYP  COPD (chronic obstructive pulmonary disease) (HCC)   Endometriosis   FIBROMYALGIA 08/31/2007  GERD 02/05/2009  Hyperlipidemia   HYPERTENSION 08/31/2007  IBS 03/11/2009  Internal hemorrhoids    Migraine headache   NEPHROLITHIASIS 08/31/2007  Pancreatitis   PONV (postoperative nausea and vomiting)   RECTAL BLEEDING 03/11/2009  Sciatica   right leg  Seizures ([place])   been about 1 year since last seisure per pt  SLEEP APNEA 08/31/2007  Substance abuse ([place])   Thyroid disease   Uterine cyst  Past Surgical History: Past Surgical History: Procedure Laterality Date  ABDOMINAL HYSTERECTOMY    BIOPSY  10/25/2018  Procedure: BIOPSY;  Surgeon: Rogene Houston, MD;  [facility]: [facility];  Service: Endoscopy;;  gastric  BIOPSY  06/11/2020  Procedure: BIOPSY;  Surgeon: Montez Morita, Quillian Quince, MD;  Location: [facility];  Service: Gastroenterology;;  small bowel  bladder stretching x6    BLADDER SURGERY    stimulator placed and stretching   CHOLECYSTECTOMY    COLONOSCOPY    COLONOSCOPY WITH PROPOFOL N/A 09/03/2014  Procedure: COLONOSCOPY WITH PROPOFOL;  Surgeon: Milus Banister, MD;  Location: [facility];  Service: Endoscopy;  Laterality: N/A;  ESOPHAGOGASTRODUODENOSCOPY (EGD) WITH PROPOFOL N/A 10/25/2018  Procedure: ESOPHAGOGASTRODUODENOSCOPY (EGD) WITH PROPOFOL;  Surgeon: Rogene Houston, MD;  [facility];  Service: Endoscopy;  Laterality: N/A;  11:15  ESOPHAGOGASTRODUODENOSCOPY (EGD) WITH PROPOFOL N/A 06/11/2020  Procedure: ESOPHAGOGASTRODUODENOSCOPY (EGD) WITH PROPOFOL;  Surgeon: Harvel Quale, MD;  Location: [facility];  Service: Gastroenterology;  Laterality: N/A;  interstitial cystitis    PACEMAKER INSERTION    in hip for interstitial cystitis  pacemaker removal    removal of uterine cyst and scrapped uterus    replaced bladder pacemaker   HPI: LAINY WROBLESKI is a 45 y.o. female who presented with complaints abdominal pain with nausea and vomiting over the last 2 weeks. Pt has had significant weight loss.  She reports difficulty going back 2 years with worsening in past 2 months resulting in inability to eat any solid foods.  Lower lungs clear on Ab CT 10/13.  Pt has been seen by GI in  the past and has had multiple normal EGDs. Most recent in [facility] documentation 06/2020 was negative  except for Candida esophagitis. Per chart she has had multiple days without BM at times, but that this is not normal for her.  She  reports hx of IBS.  Pt with medical history significant of hypertension, hyperlipidemia, chronic interstitial cystitis, COPD, pancreatitis, bipolar disorder, tobacco abuse, anxiety, and depression.  Subjective: Pt awake, alert, pleasant, participative Assessment / Plan / Recommendation CHL IP CLINICAL IMPRESSIONS 01/25/2021 Clinical Impression Pt was seen for an MBS. Overall, pt presents with functional oral and pharyngeal swallow across all consistencies. Pt reported chronic nausea and difficulty keeping food down for two years, exhibiting hesitancy to swallow barium d/t fear of emesis. SLP provided encouragement and compensatory strategies for tolerating barium (small sips, quick oral phase, etc.), and provided pt with emesis bag to elicit relaxation. Pt tolerated small sips and bites of thin liquid, puree, mechanical soft, and regular solids exhibiting no oral phase or pharyngeal phase impairments. Pt did report moderate odynophagia and globus sensation in throat after presentations of solids and SLP showed pt evidence of clear imaging exhibiting no pharyngeal residue. MBS does not diagnose below the level of the UES however scan revealed what appeared to be timely transit to stomach without residue. Pt needs no acute SLP follow up. Recommend continuing Dys1/thin liquid diet d/t pt discomfort re: swallowing solids. SLP Visit Diagnosis Dysphagia, unspecified (R13.10) Attention and concentration deficit following -- Frontal lobe and executive function deficit following -- Impact on safety and function Mild aspiration risk   CHL IP TREATMENT RECOMMENDATION 01/25/2021 Treatment Recommendations No treatment recommended at this time   Prognosis 01/23/2021 Prognosis for Safe Diet Advancement (No  Data) Barriers to Reach Goals -- Barriers/Prognosis Comment -- CHL IP DIET RECOMMENDATION 01/25/2021 SLP Diet Recommendations Dysphagia 1 (Puree) solids;Thin liquid Liquid Administration via Cup;Straw Medication Administration Crushed with puree Compensations Small sips/bites Postural Changes Remain semi-upright after after feeds/meals (Comment);Seated upright at 90 degrees   CHL IP OTHER RECOMMENDATIONS 01/25/2021 Recommended Consults -- Oral Care Recommendations Oral care BID Other Recommendations --   CHL IP FOLLOW UP RECOMMENDATIONS 01/25/2021 Follow up Recommendations None   CHL IP FREQUENCY AND DURATION 01/23/2021 Speech Therapy Frequency (ACUTE ONLY) min 2x/week Treatment Duration 2 weeks      CHL IP ORAL PHASE 01/25/2021 Oral Phase WFL Oral - Pudding Teaspoon -- Oral - Pudding Cup -- Oral - Honey Teaspoon -- Oral - Honey Cup -- Oral - Nectar Teaspoon -- Oral - Nectar Cup -- Oral - Nectar Straw -- Oral - Thin Teaspoon -- Oral - Thin Cup -- Oral - Thin Straw -- Oral - Puree -- Oral - Mech Soft -- Oral - Regular -- Oral - Multi-Consistency -- Oral - Pill -- Oral Phase - Comment --  CHL IP PHARYNGEAL PHASE 01/25/2021 Pharyngeal Phase WFL Pharyngeal- Pudding Teaspoon -- Pharyngeal -- Pharyngeal- Pudding Cup -- Pharyngeal -- Pharyngeal- Honey Teaspoon -- Pharyngeal -- Pharyngeal- Honey Cup -- Pharyngeal -- Pharyngeal- Nectar Teaspoon -- Pharyngeal -- Pharyngeal- Nectar Cup -- Pharyngeal -- Pharyngeal- Nectar Straw -- Pharyngeal -- Pharyngeal- Thin Teaspoon -- Pharyngeal -- Pharyngeal- Thin Cup -- Pharyngeal -- Pharyngeal- Thin Straw -- Pharyngeal -- Pharyngeal- Puree -- Pharyngeal -- Pharyngeal- Mechanical Soft -- Pharyngeal -- Pharyngeal- Regular -- Pharyngeal -- Pharyngeal- Multi-consistency -- Pharyngeal -- Pharyngeal- Pill -- Pharyngeal -- Pharyngeal Comment --  CHL IP CERVICAL ESOPHAGEAL PHASE 01/25/2021 Cervical Esophageal Phase WFL Pudding Teaspoon -- Pudding Cup -- Honey Teaspoon -- Honey Cup -- Nectar  Teaspoon -- Nectar Cup -- Nectar Straw -- Thin Teaspoon -- Thin Cup -- Thin Straw -- Puree -- Mechanical Soft -- Regular -- Multi-consistency -- Pill -- Cervical Esophageal Comment -- [place] Siren 01/25/2021, 4:16 PM  Scheduled Meds:  amoxicillin  500 mg Per Tube Q8H   arformoterol  15 mcg Nebulization BID   budesonide (PULMICORT) nebulizer solution  0.5 mg Nebulization BID   Chlorhexidine Gluconate Cloth  6 each Topical Daily   cycloSPORINE  1 drop Both Eyes BID   enoxaparin (LOVENOX) injection  30 mg Subcutaneous Q24H   estradiol  2 mg Oral Daily   fluticasone  2 spray Each Nare Daily   free water  100 mL Per Tube Q4H   gabapentin  800 mg Oral QID   guaiFENesin  600 mg Oral BID   hydrOXYzine  50 mg Oral QHS   mirtazapine  15 mg Oral QHS   neomycin-polymyxin-hydrocortisone  3 drop Both EARS Q8H   nicotine  21 mg Transdermal Daily   oxybutynin  15 mg Oral Daily   pantoprazole (PROTONIX) IV  40 mg Intravenous Q12H   QUEtiapine  400 mg Oral QHS   sodium chloride flush  3 mL Intravenous Q12H   vortioxetine HBr  10 mg Oral Daily   Continuous Infusions:  feeding supplement (OSMOLITE 1.2 CAL) 1,000 mL (01/24/21 1611)   promethazine (PHENERGAN) injection (IM or IVPB) Stopped (01/25/21 2103)          Aline August, MD [facility] 01/26/2021, 7:26 AM

## 2021-01-26 NOTE — Consult Note (Signed)
Chief Complaint: Patient was seen in consultation today for percutaneous gastric tube placement Chief Complaint  Patient presents with   Abdominal Pain   Back Pain   Headache   at the request of Dr Arelia Sneddon   Supervising Physician: Juliet Rude  Patient Status: [facility] - In-pt  History of Present Illness: [name] is a 45 y.o. female    HTN; HLD; COPD Pancreatitis Bipolar disorder; tobacco abuse Worsening abd pain Malnutrition Wt loss; poor po intake Failure to thrive; N/V Dysphagia  GI Dr Lyndel Safe note yesterday:   Failure to thrive with weight loss Several likely functional GI complaints.  Neg MBS for any oropharyngeal dysphagia.  She had negative EGDs in the past. Associated bipolar disorder, COPD, fibromyalgia, IBS Interstitial cystitis Plan: -No need for barium swallow or rpt EGD. -Can proceed directly to PEG for now. -May have to arrange home health care for PEG feeds and teaching. -If any further N/V despite PEG, can consider PEJ.  I doubt if she needs it. -I do recommend close follow-up with psychiatry. -I discussed extensively with the patient, patient's husband and patient's mother. -She will follow-up with Dr Maylon Peppers as outpatient  Request for percutaneous gastric tube placement Dr Carlyon Shadow has reviewed imaging and approves procedure Plan tentatively for 10/20 in IR  Past Medical History:  Diagnosis Date   Anal fissure 03/11/2009   Anemia    Anxiety and depression    ARTHRITIS 08/31/2007   Asthma    BENZODIAZEPINE ADDICTION 08/31/2007   Bipolar 1 disorder ([place])    Bowel obstruction ([place])    Breakdown of urinary electronic stimulator device, init ([place])    BRONCHITIS, RECURRENT 08/23/2009   Asthmatic Bronchitis-Dr. Melvyn Novas.....-HFA 75% 12/04/2008>75% 02/05/2009>75% 08/04/2009 -PFT's 01/04/2009 2.56 (86%) ratio 75, no resp to B2 and DLC0 67% > 80 after correction    Cancer (HCC)    cervical cancer   Chronic interstitial cystitis 03/11/2009    Chronic nausea    Chronic pain    COLONIC POLYPS, HX OF 07/25/2006   ADENOMATOUS POLYP   COPD (chronic obstructive pulmonary disease) (HCC)    Endometriosis    FIBROMYALGIA 08/31/2007   GERD 02/05/2009   Hyperlipidemia    HYPERTENSION 08/31/2007   IBS 03/11/2009   Internal hemorrhoids    Migraine headache    NEPHROLITHIASIS 08/31/2007   Pancreatitis    PONV (postoperative nausea and vomiting)    RECTAL BLEEDING 03/11/2009   Sciatica    right leg   Seizures ([place])    been about 1 year since last seisure per pt   SLEEP APNEA 08/31/2007   Substance abuse ([place])    Thyroid disease    Uterine cyst     Past Surgical History:  Procedure Laterality Date   ABDOMINAL HYSTERECTOMY     BIOPSY  10/25/2018   Procedure: BIOPSY;  Surgeon: Rogene Houston, MD;  [facility];  Service: Endoscopy;;  gastric   BIOPSY  06/11/2020   Procedure: BIOPSY;  Surgeon: Montez Morita, Quillian Quince, MD;  [facility];  Service: Gastroenterology;;  small bowel   bladder stretching x6     BLADDER SURGERY     stimulator placed and stretching    CHOLECYSTECTOMY     COLONOSCOPY     COLONOSCOPY WITH PROPOFOL N/A 09/03/2014   Procedure: COLONOSCOPY WITH PROPOFOL;  Surgeon: Milus Banister, MD;  [facility];  Service: Endoscopy;  Laterality: N/A;   ESOPHAGOGASTRODUODENOSCOPY (EGD) WITH PROPOFOL N/A 10/25/2018   Procedure: ESOPHAGOGASTRODUODENOSCOPY (  EGD) WITH PROPOFOL;  Surgeon: Rogene Houston, MD;  [facility];  Service: Endoscopy;  Laterality: N/A;  11:15   ESOPHAGOGASTRODUODENOSCOPY (EGD) WITH PROPOFOL N/A 06/11/2020   Procedure: ESOPHAGOGASTRODUODENOSCOPY (EGD) WITH PROPOFOL;  Surgeon: Harvel Quale, MD;  [facility];  Service: Gastroenterology;  Laterality: N/A;   interstitial cystitis     PACEMAKER INSERTION     in hip for interstitial cystitis   pacemaker removal     removal of uterine cyst and scrapped uterus     replaced bladder  pacemaker      Allergies: Abilify [aripiprazole], Amitriptyline, Metoclopramide hcl, Propoxyphene, Toradol [ketorolac tromethamine], Tramadol, Ambien [zolpidem tartrate], Eszopiclone, Varenicline, Buprenorphine hcl, Demerol [meperidine], Emetrol, and Morphine and related  Medications: Prior to Admission medications   Medication Sig Start Date End Date Taking? Authorizing Provider  acetaminophen (TYLENOL) 500 MG tablet Take 1,000 mg by mouth every 6 (six) hours as needed for mild pain, fever or headache.   Yes [provider]  albuterol (PROAIR HFA) 108 (90 Base) MCG/ACT inhaler INHALE 2 PUFFS EVERY 6 HOURS AS NEEDED FOR SHORTNESS OF BREATH AND WHEEZING. Patient taking differently: No sig reported 02/27/20  Yes Lowne Chase, Yvonne R, DO  clonazePAM (KLONOPIN) 0.5 MG tablet Take 1 tablet (0.5 mg total) by mouth 3 (three) times daily as needed for anxiety. 05/01/19  Yes [name], DO  cyclobenzaprine (FLEXERIL) 5 MG tablet Take 1 tablet (5 mg total) by mouth 3 (three) times daily as needed for muscle spasms. 05/21/20  Yes Mordecai Rasmussen, MD  estradiol (ESTRACE) 0.1 MG/GM vaginal cream Place 1 Applicatorful vaginally every 3 (three) days. 11/21/19  Yes [provider]  estradiol (ESTRACE) 2 MG tablet Take 2 mg by mouth daily.   Yes [provider]  fluticasone (FLONASE) 50 MCG/ACT nasal spray Place 2 sprays into both nostrils daily. Patient taking differently: Place 2 sprays into both nostrils daily as needed for allergies. 04/22/19  Yes Roma Schanz R, DO  gabapentin (NEURONTIN) 800 MG tablet Take 800 mg by mouth in the morning, at noon, in the evening, and at bedtime. 05/19/20  Yes [provider]  HYDROcodone-acetaminophen (NORCO/VICODIN) 5-325 MG tablet Take 1 tablet by mouth 3 (three) times daily as needed for pain. 05/25/20  Yes [provider]  hydrOXYzine (ATARAX/VISTARIL) 50 MG tablet Take 50 mg by mouth at bedtime. 08/26/18  Yes  [provider]  Hyoscyamine Sulfate SL 0.125 MG SUBL Take 0.125 mg by mouth every 4 (four) hours as needed (for cramping). 11/04/19  Yes [provider]  mirtazapine (REMERON) 15 MG tablet Take 15 mg by mouth at bedtime. 11/24/20  Yes [provider]  omeprazole (PRILOSEC) 40 MG capsule TAKE 1 CAPSULE BY MOUTH ONCE DAILY. Patient taking differently: Take 40 mg by mouth daily. 06/22/20  Yes Roma Schanz R, DO  ondansetron (ZOFRAN-ODT) 8 MG disintegrating tablet Take 1 tablet (8 mg total) by mouth every 8 (eight) hours as needed for nausea or vomiting. 44/03/47  Yes Delora Fuel, MD  oxybutynin (DITROPAN XL) 15 MG 24 hr tablet Take 15 mg by mouth daily. 09/16/19  Yes [provider]  promethazine (PHENERGAN) 25 MG tablet TAKE (1) TABLET EVERY SIX HOURS AS NEEDED FOR NAUSEA AND VOMITING. Patient taking differently: Take 25 mg by mouth every 6 (six) hours as needed for nausea or vomiting. 11/08/20  Yes Roma Schanz R, DO  QUEtiapine (SEROQUEL) 400 MG tablet Take 400 mg by mouth at  bedtime.  09/16/18  Yes [provider]  RESTASIS 0.05 % ophthalmic emulsion Place 1 drop into both eyes 2 (two) times daily. 02/26/20  Yes [provider]  SYMBICORT 80-4.5 MCG/ACT inhaler INHALE 2 PUFFS INTO THE LUNGS TWICE DAILY. Patient taking differently: Inhale 2 puffs into the lungs in the morning and at bedtime. 12/28/20  Yes Saguier, Percell Miller, PA-C  potassium chloride SA (KLOR-CON) 20 MEQ tablet Take 1 tablet (20 mEq total) by mouth 2 (two) times daily. Patient not taking: No sig reported 95/09/32   Delora Fuel, MD  promethazine (PHENERGAN) 25 MG suppository Place 1 suppository (25 mg total) rectally every 6 (six) hours as needed for nausea or vomiting. Patient not taking: No sig reported 67/12/45   Delora Fuel, MD     Family History  Problem Relation Age of Onset   Heart disease Father    Asthma Maternal Grandmother    Emphysema Maternal Grandfather     Cancer Maternal Grandfather        Lung Cancer   Cancer Other        Lung Cancer-Aunt   Colon cancer Neg Hx    Esophageal cancer Neg Hx    Rectal cancer Neg Hx    Stomach cancer Neg Hx    Thyroid disease Neg Hx     Social History   Socioeconomic History   Marital status: Married    Spouse name: Not on file   Number of children: 2   Years of education: Not on file   Highest education level: Not on file  Occupational History    Employer: UNEMPLOYED  Tobacco Use   Smoking status: Every Day    Packs/day: 0.50    Years: 14.00    Pack years: 7.00    Types: Cigarettes   Smokeless tobacco: Never  Vaping Use   Vaping Use: Some days  Substance and Sexual Activity   Alcohol use: Not Currently    Comment: last drink 3 weeks ago; recently released from rehab   Drug use: Not Currently    Types: Marijuana    Comment: once a month   Sexual activity: Not on file  Other Topics Concern   Not on file  Social History Narrative   Homemaker   Daily Caffeine Use-Mtn. Dew         Social Determinants of Health   Financial Resource Strain: Not on file  Food Insecurity: Not on file  Transportation Needs: Not on file  Physical Activity: Not on file  Stress: Not on file  Social Connections: Not on file    Review of Systems: A 12 point ROS discussed and pertinent positives are indicated in the HPI above.  All other systems are negative.  Review of Systems  Constitutional:  Positive for activity change and unexpected weight change. Negative for fatigue and fever.  HENT:  Positive for trouble swallowing. Negative for sore throat.   Respiratory:  Negative for cough and shortness of breath.   Cardiovascular:  Negative for chest pain.  Gastrointestinal:  Positive for abdominal pain, nausea and vomiting.  Neurological:  Positive for weakness.  Psychiatric/Behavioral:  Negative for behavioral problems and confusion.    Vital Signs: BP 98/71 (BP Location: Right Arm)   Pulse 81   Temp  97.9 F (36.6 C) (Oral)   Resp 18   Ht 5\' 4"  (1.626 m)   Wt 86 lb 9.6 oz (39.3 kg) Comment: scale b  SpO2 100%   BMI 14.86 kg/m   Physical Exam  Vitals reviewed.  Cardiovascular:     Rate and Rhythm: Normal rate and regular rhythm.  Pulmonary:     Effort: Pulmonary effort is normal.     Breath sounds: Normal breath sounds.  Abdominal:     General: Abdomen is flat.     Palpations: Abdomen is soft.     Tenderness: There is no abdominal tenderness.  Skin:    General: Skin is warm.  Neurological:     Mental Status: She is alert and oriented to person, place, and time.  Psychiatric:        Behavior: Behavior normal.    Imaging: DG Abd 1 View  Result Date: 01/24/2021 CLINICAL DATA:  NG tube placement EXAM: ABDOMEN - 1 VIEW COMPARISON:  None. FINDINGS: Enteric tube terminates in the gastric cardia. Nonobstructive bowel gas pattern. Lung bases are clear. IMPRESSION: Enteric tube terminates in the gastric cardia. Electronically Signed   By: Julian Hy M.D.   On: 01/24/2021 03:30   CT ABDOMEN PELVIS W CONTRAST  Result Date: 01/20/2021 CLINICAL DATA:  Nonlocalized acute abdominal pain. States that her bladder is not working and she needs a stimulator. She is having surgery in a week. Here today with back/abdominal pain and headache. EXAM: CT ABDOMEN AND PELVIS WITH CONTRAST TECHNIQUE: Multidetector CT imaging of the abdomen and pelvis was performed using the standard protocol following bolus administration of intravenous contrast. CONTRAST:  11mL OMNIPAQUE IOHEXOL 300 MG/ML  SOLN COMPARISON:  CT abdomen pelvis 12/19/2018 FINDINGS: Lower chest: No acute abnormality. Hepatobiliary: No focal liver abnormality. Status post cholecystectomy. Launch enlarged common bile duct with no intrahepatic biliary dilatation. Pancreas: No focal lesion. Normal pancreatic contour. No surrounding inflammatory changes. No main pancreatic ductal dilatation. Spleen: Normal in size without focal abnormality.  Adrenals/Urinary Tract: No adrenal nodule bilaterally. Bilateral kidneys enhance symmetrically. No hydronephrosis. No hydroureter. The urinary bladder is unremarkable. On delayed imaging, there is no urothelial wall thickening and there are no filling defects in the opacified portions of the bilateral collecting systems or ureters. Stomach/Bowel: Stomach is within normal limits. No evidence of bowel wall thickening or dilatation. Appendix appears normal. Vascular/Lymphatic: No abdominal aorta or iliac aneurysm. No abdominal, pelvic, or inguinal lymphadenopathy. Reproductive: Status post hysterectomy. No adnexal masses. Other: No intraperitoneal free fluid. No intraperitoneal free gas. No organized fluid collection. Musculoskeletal: No abdominal wall hernia or abnormality. No suspicious lytic or blastic osseous lesions. No acute displaced fracture. Similar-appearing L5 compression fracture. IMPRESSION: 1. No acute intra-abdominal or intrapelvic abnormality. 2. Chronic similar-appearing L5 compression fracture. Electronically Signed   By: Iven Finn M.D.   On: 01/20/2021 22:22   DG [place] Dys Tube Plc W/Fl W/Rad  Result Date: 01/22/2021 CLINICAL DATA:  NG tube placement EXAM: NASO G TUBE PLACEMENT WITH FL AND WITH RAD FLUOROSCOPY TIME:  Fluoroscopy Time:  36 seconds Radiation Exposure Index (if provided by the fluoroscopic device): 1 mGy COMPARISON:  None. FINDINGS: Multiple attempts were made to place the enteric tube in a post pyloric position. However, the tube could not be directed toward the right. Ultimately, the tube was left within the stomach directed toward the left. IMPRESSION: Fluoroscopic guided placement of enteric tube within the stomach. Post pyloric positioning could not be obtained. Electronically Signed   By: Macy Mis M.D.   On: 01/22/2021 17:25   DG Swallowing Func-Speech Pathology  Result Date: 01/25/2021 Table formatting from the original result was not included. Objective  Swallowing Evaluation: Type of Study: MBS-Modified Barium Swallow Study  Patient Details Name:  [name] MRN: [id number] Date of Birth: 08-16-1975 Today's Date: 01/25/2021 Time: SLP Start Time (ACUTE ONLY): 7341 -SLP Stop Time (ACUTE ONLY): 9379 SLP Time Calculation (min) (ACUTE ONLY): 32 min Past Medical History: Past Medical History: Diagnosis Date  Anal fissure 03/11/2009  Anemia   Anxiety and depression   ARTHRITIS 08/31/2007  Asthma   BENZODIAZEPINE ADDICTION 08/31/2007  Bipolar 1 disorder (HCC)   Bowel obstruction (HCC)   Breakdown of urinary electronic stimulator device, init ([place])   BRONCHITIS, RECURRENT 08/23/2009  Asthmatic Bronchitis-Dr. Melvyn Novas.....-HFA 75% 12/04/2008>75% 02/05/2009>75% 08/04/2009 -PFT's 01/04/2009 2.56 (86%) ratio 75, no resp to B2 and DLC0 67% > 80 after correction   Cancer (HCC)   cervical cancer  Chronic interstitial cystitis 03/11/2009  Chronic nausea   Chronic pain   COLONIC POLYPS, HX OF 07/25/2006  ADENOMATOUS POLYP  COPD (chronic obstructive pulmonary disease) (HCC)   Endometriosis   FIBROMYALGIA 08/31/2007  GERD 02/05/2009  Hyperlipidemia   HYPERTENSION 08/31/2007  IBS 03/11/2009  Internal hemorrhoids   Migraine headache   NEPHROLITHIASIS 08/31/2007  Pancreatitis   PONV (postoperative nausea and vomiting)   RECTAL BLEEDING 03/11/2009  Sciatica   right leg  Seizures ([place])   been about 1 year since last seisure per pt  SLEEP APNEA 08/31/2007  Substance abuse ([place])   Thyroid disease   Uterine cyst  Past Surgical History: Past Surgical History: Procedure Laterality Date  ABDOMINAL HYSTERECTOMY    BIOPSY  10/25/2018  Procedure: BIOPSY;  Surgeon: Rogene Houston, MD;  [facility];  Service: Endoscopy;;  gastric  BIOPSY  06/11/2020  Procedure: BIOPSY;  Surgeon: Montez Morita, Quillian Quince, MD;  [facility];  Service: Gastroenterology;;  small bowel  bladder stretching x6    BLADDER SURGERY    stimulator placed and stretching   CHOLECYSTECTOMY    COLONOSCOPY     COLONOSCOPY WITH PROPOFOL N/A 09/03/2014  Procedure: COLONOSCOPY WITH PROPOFOL;  Surgeon: Milus Banister, MD;  [facility];  Service: Endoscopy;  Laterality: N/A;  ESOPHAGOGASTRODUODENOSCOPY (EGD) WITH PROPOFOL N/A 10/25/2018  Procedure: ESOPHAGOGASTRODUODENOSCOPY (EGD) WITH PROPOFOL;  Surgeon: Rogene Houston, MD;  [facility];  Service: Endoscopy;  Laterality: N/A;  11:15  ESOPHAGOGASTRODUODENOSCOPY (EGD) WITH PROPOFOL N/A 06/11/2020  Procedure: ESOPHAGOGASTRODUODENOSCOPY (EGD) WITH PROPOFOL;  Surgeon: Harvel Quale, MD;  [facility];  Service: Gastroenterology;  Laterality: N/A;  interstitial cystitis    PACEMAKER INSERTION    in hip for interstitial cystitis  pacemaker removal    removal of uterine cyst and scrapped uterus    replaced bladder pacemaker   HPI: [name] is a 45 y.o. female who presented with complaints abdominal pain with nausea and vomiting over the last 2 weeks. Pt has had significant weight loss.  She reports difficulty going back 2 years with worsening in past 2 months resulting in inability to eat any solid foods.  Lower lungs clear on Ab CT 10/13.  Pt has been seen by GI in the past and has had multiple normal EGDs. Most recent in [facility] documentation 06/2020 was negative  except for Candida esophagitis. Per chart she has had multiple days without BM at times, but that this is not normal for her.  She reports hx of IBS.  Pt with medical history significant of hypertension, hyperlipidemia, chronic interstitial cystitis, COPD, pancreatitis, bipolar disorder, tobacco abuse, anxiety, and depression.  Subjective: Pt awake, alert, pleasant, participative Assessment / Plan / Recommendation CHL IP CLINICAL IMPRESSIONS 01/25/2021 Clinical Impression Pt was  seen for an MBS. Overall, pt presents with functional oral and pharyngeal swallow across all consistencies. Pt reported chronic nausea and difficulty keeping food down for two years, exhibiting hesitancy  to swallow barium d/t fear of emesis. SLP provided encouragement and compensatory strategies for tolerating barium (small sips, quick oral phase, etc.), and provided pt with emesis bag to elicit relaxation. Pt tolerated small sips and bites of thin liquid, puree, mechanical soft, and regular solids exhibiting no oral phase or pharyngeal phase impairments. Pt did report moderate odynophagia and globus sensation in throat after presentations of solids and SLP showed pt evidence of clear imaging exhibiting no pharyngeal residue. MBS does not diagnose below the level of the UES however scan revealed what appeared to be timely transit to stomach without residue. Pt needs no acute SLP follow up. Recommend continuing Dys1/thin liquid diet d/t pt discomfort re: swallowing solids. SLP Visit Diagnosis Dysphagia, unspecified (R13.10) Attention and concentration deficit following -- Frontal lobe and executive function deficit following -- Impact on safety and function Mild aspiration risk   CHL IP TREATMENT RECOMMENDATION 01/25/2021 Treatment Recommendations No treatment recommended at this time   Prognosis 01/23/2021 Prognosis for Safe Diet Advancement (No Data) Barriers to Reach Goals -- Barriers/Prognosis Comment -- CHL IP DIET RECOMMENDATION 01/25/2021 SLP Diet Recommendations Dysphagia 1 (Puree) solids;Thin liquid Liquid Administration via Cup;Straw Medication Administration Crushed with puree Compensations Small sips/bites Postural Changes Remain semi-upright after after feeds/meals (Comment);Seated upright at 90 degrees   CHL IP OTHER RECOMMENDATIONS 01/25/2021 Recommended Consults -- Oral Care Recommendations Oral care BID Other Recommendations --   CHL IP FOLLOW UP RECOMMENDATIONS 01/25/2021 Follow up Recommendations None   CHL IP FREQUENCY AND DURATION 01/23/2021 Speech Therapy Frequency (ACUTE ONLY) min 2x/week Treatment Duration 2 weeks      CHL IP ORAL PHASE 01/25/2021 Oral Phase WFL Oral - Pudding Teaspoon -- Oral  - Pudding Cup -- Oral - Honey Teaspoon -- Oral - Honey Cup -- Oral - Nectar Teaspoon -- Oral - Nectar Cup -- Oral - Nectar Straw -- Oral - Thin Teaspoon -- Oral - Thin Cup -- Oral - Thin Straw -- Oral - Puree -- Oral - Mech Soft -- Oral - Regular -- Oral - Multi-Consistency -- Oral - Pill -- Oral Phase - Comment --  CHL IP PHARYNGEAL PHASE 01/25/2021 Pharyngeal Phase WFL Pharyngeal- Pudding Teaspoon -- Pharyngeal -- Pharyngeal- Pudding Cup -- Pharyngeal -- Pharyngeal- Honey Teaspoon -- Pharyngeal -- Pharyngeal- Honey Cup -- Pharyngeal -- Pharyngeal- Nectar Teaspoon -- Pharyngeal -- Pharyngeal- Nectar Cup -- Pharyngeal -- Pharyngeal- Nectar Straw -- Pharyngeal -- Pharyngeal- Thin Teaspoon -- Pharyngeal -- Pharyngeal- Thin Cup -- Pharyngeal -- Pharyngeal- Thin Straw -- Pharyngeal -- Pharyngeal- Puree -- Pharyngeal -- Pharyngeal- Mechanical Soft -- Pharyngeal -- Pharyngeal- Regular -- Pharyngeal -- Pharyngeal- Multi-consistency -- Pharyngeal -- Pharyngeal- Pill -- Pharyngeal -- Pharyngeal Comment --  CHL IP CERVICAL ESOPHAGEAL PHASE 01/25/2021 Cervical Esophageal Phase WFL Pudding Teaspoon -- Pudding Cup -- Honey Teaspoon -- Honey Cup -- Nectar Teaspoon -- Nectar Cup -- Nectar Straw -- Thin Teaspoon -- Thin Cup -- Thin Straw -- Puree -- Mechanical Soft -- Regular -- Multi-consistency -- Pill -- Cervical Esophageal Comment -- [name] 01/25/2021, 4:16 PM               Labs:  CBC: Recent Labs    01/23/21 0403 01/24/21 0407 01/25/21 0339 01/26/21 0359  WBC 10.4 6.8 6.4 5.2  HGB 13.7 12.1 11.8* 11.9*  HCT 43.7 35.9* 35.6* 35.5*  PLT 208 323 302 313    COAGS: No results for input(s): INR, APTT in the last 8760 hours.  BMP: Recent Labs    01/23/21 0403 01/24/21 0407 01/25/21 0339 01/26/21 0359  NA 141 139 139 136  K 3.9 4.1 4.1 4.0  CL 100 106 103 100  CO2 33* 26 29 27   GLUCOSE 113* 108* 90 129*  BUN 15 5* 7 8  CALCIUM 9.0 9.7 9.6 9.2  CREATININE 0.57 0.70 0.72 0.70  GFRNONAA  >60 >60 >60 >60    LIVER FUNCTION TESTS: Recent Labs    01/23/21 0403 01/24/21 0407 01/25/21 0339 01/26/21 0359  BILITOT 0.4 0.3 0.3 0.3  AST 18 16 34 15  ALT 21 15 12 12   ALKPHOS 92 65 63 66  PROT 6.1* 6.3* 6.3* 6.4*  ALBUMIN 3.3* 3.5 3.5 3.6    TUMOR MARKERS: No results for input(s): AFPTM, CEA, CA199, CHROMGRNA in the last 8760 hours.  Assessment and Plan:  Severe malnutrition Wt loss; poor intake Dysphagia Scheduled for percutaneous gastric tube placement Risks and benefits image guided gastrostomy tube placement was discussed with the patient including, but not limited to the need for a barium enema during the procedure, bleeding, infection, peritonitis and/or damage to adjacent structures.  All of the patient's questions were answered, patient is agreeable to proceed. Consent signed and in chart.    Thank you for this interesting consult.  I greatly enjoyed meeting [name] and look forward to participating in their care.  A copy of this report was sent to the requesting provider on this date.  Electronically Signed: Lavonia Drafts, PA-C 01/26/2021, 11:48 AM   I spent a total of 20 Minutes    in face to face in clinical consultation, greater than 50% of which was counseling/coordinating care for percutaneous gastric tube placement

## 2021-01-27 ENCOUNTER — Inpatient Hospital Stay (HOSPITAL_COMMUNITY): Payer: BC Managed Care – PPO

## 2021-01-27 DIAGNOSIS — F319 Bipolar disorder, unspecified: Secondary | ICD-10-CM | POA: Diagnosis not present

## 2021-01-27 DIAGNOSIS — N390 Urinary tract infection, site not specified: Secondary | ICD-10-CM | POA: Diagnosis not present

## 2021-01-27 DIAGNOSIS — N3010 Interstitial cystitis (chronic) without hematuria: Secondary | ICD-10-CM | POA: Diagnosis not present

## 2021-01-27 DIAGNOSIS — M797 Fibromyalgia: Secondary | ICD-10-CM | POA: Diagnosis not present

## 2021-01-27 HISTORY — PX: IR GASTROSTOMY TUBE MOD SED: IMG625

## 2021-01-27 LAB — PROTIME-INR
INR: 0.9 (ref 0.8–1.2)
Prothrombin Time: 11.8 seconds (ref 11.4–15.2)

## 2021-01-27 LAB — GLUCOSE, CAPILLARY
Glucose-Capillary: 106 mg/dL — ABNORMAL HIGH (ref 70–99)
Glucose-Capillary: 69 mg/dL — ABNORMAL LOW (ref 70–99)
Glucose-Capillary: 85 mg/dL (ref 70–99)
Glucose-Capillary: 97 mg/dL (ref 70–99)
Glucose-Capillary: 97 mg/dL (ref 70–99)
Glucose-Capillary: 99 mg/dL (ref 70–99)

## 2021-01-27 LAB — CBC WITH DIFFERENTIAL/PLATELET
Abs Immature Granulocytes: 0.01 10*3/uL (ref 0.00–0.07)
Basophils Absolute: 0.1 10*3/uL (ref 0.0–0.1)
Basophils Relative: 1 %
Eosinophils Absolute: 0.4 10*3/uL (ref 0.0–0.5)
Eosinophils Relative: 6 %
HCT: 38.8 % (ref 36.0–46.0)
Hemoglobin: 12.7 g/dL (ref 12.0–15.0)
Immature Granulocytes: 0 %
Lymphocytes Relative: 48 %
Lymphs Abs: 2.9 10*3/uL (ref 0.7–4.0)
MCH: 31.5 pg (ref 26.0–34.0)
MCHC: 32.7 g/dL (ref 30.0–36.0)
MCV: 96.3 fL (ref 80.0–100.0)
Monocytes Absolute: 0.5 10*3/uL (ref 0.1–1.0)
Monocytes Relative: 9 %
Neutro Abs: 2.2 10*3/uL (ref 1.7–7.7)
Neutrophils Relative %: 36 %
Platelets: 302 10*3/uL (ref 150–400)
RBC: 4.03 MIL/uL (ref 3.87–5.11)
RDW: 12.3 % (ref 11.5–15.5)
WBC: 6.1 10*3/uL (ref 4.0–10.5)
nRBC: 0 % (ref 0.0–0.2)

## 2021-01-27 LAB — COMPREHENSIVE METABOLIC PANEL
ALT: 13 U/L (ref 0–44)
AST: 17 U/L (ref 15–41)
Albumin: 3.7 g/dL (ref 3.5–5.0)
Alkaline Phosphatase: 66 U/L (ref 38–126)
Anion gap: 8 (ref 5–15)
BUN: 11 mg/dL (ref 6–20)
CO2: 30 mmol/L (ref 22–32)
Calcium: 9.6 mg/dL (ref 8.9–10.3)
Chloride: 99 mmol/L (ref 98–111)
Creatinine, Ser: 0.75 mg/dL (ref 0.44–1.00)
GFR, Estimated: >60 mL/min (ref >60)
Glucose, Bld: 92 mg/dL (ref 70–99)
Potassium: 4.6 mmol/L (ref 3.5–5.1)
Sodium: 137 mmol/L (ref 135–145)
Total Bilirubin: 0.3 mg/dL (ref 0.3–1.2)
Total Protein: 6.8 g/dL (ref 6.5–8.1)

## 2021-01-27 LAB — MAGNESIUM: Magnesium: 2.1 mg/dL (ref 1.7–2.4)

## 2021-01-27 MED ORDER — AMOXICILLIN 500 MG PO CAPS
500.0000 mg | ORAL_CAPSULE | Freq: Three times a day (TID) | ORAL | Status: AC
  Administered 2021-01-27: 500 mg via ORAL
  Filled 2021-01-27: qty 1

## 2021-01-27 MED ORDER — LIDOCAINE HCL 1 % IJ SOLN
INTRAMUSCULAR | Status: AC
  Filled 2021-01-27: qty 20

## 2021-01-27 MED ORDER — STERILE WATER FOR INJECTION IJ SOLN
INTRAMUSCULAR | Status: AC | PRN
  Administered 2021-01-27 (×2): 10 mL via GASTROSTOMY

## 2021-01-27 MED ORDER — FENTANYL CITRATE (PF) 100 MCG/2ML IJ SOLN
INTRAMUSCULAR | Status: AC
  Filled 2021-01-27: qty 2

## 2021-01-27 MED ORDER — STERILE WATER FOR INJECTION IJ SOLN
INTRAMUSCULAR | Status: AC
  Filled 2021-01-27: qty 20

## 2021-01-27 MED ORDER — MIDAZOLAM HCL 2 MG/2ML IJ SOLN
INTRAMUSCULAR | Status: AC | PRN
  Administered 2021-01-27: .5 mg via INTRAVENOUS
  Administered 2021-01-27: 1 mg via INTRAVENOUS
  Administered 2021-01-27: .5 mg via INTRAVENOUS

## 2021-01-27 MED ORDER — GLUCAGON HCL (RDNA) 1 MG IJ SOLR
INTRAMUSCULAR | Status: AC | PRN
  Administered 2021-01-27: 1 mg via INTRAVENOUS

## 2021-01-27 MED ORDER — GLUCAGON HCL RDNA (DIAGNOSTIC) 1 MG IJ SOLR
INTRAMUSCULAR | Status: AC
  Filled 2021-01-27: qty 1

## 2021-01-27 MED ORDER — FENTANYL CITRATE (PF) 100 MCG/2ML IJ SOLN
INTRAMUSCULAR | Status: AC | PRN
  Administered 2021-01-27 (×3): 25 ug via INTRAVENOUS

## 2021-01-27 MED ORDER — LIDOCAINE HCL 1 % IJ SOLN
INTRAMUSCULAR | Status: AC | PRN
  Administered 2021-01-27: 10 mL via INTRADERMAL
  Administered 2021-01-27: 20 mL via INTRADERMAL

## 2021-01-27 MED ORDER — HYDROMORPHONE HCL 1 MG/ML IJ SOLN
1.0000 mg | INTRAMUSCULAR | Status: DC | PRN
  Administered 2021-01-27 – 2021-01-31 (×15): 1 mg via INTRAVENOUS
  Filled 2021-01-27 (×17): qty 1

## 2021-01-27 MED ORDER — IOHEXOL 300 MG/ML  SOLN
100.0000 mL | Freq: Once | INTRAMUSCULAR | Status: AC | PRN
  Administered 2021-01-27: 12 mL

## 2021-01-27 MED ORDER — CEFAZOLIN SODIUM-DEXTROSE 2-4 GM/100ML-% IV SOLN
INTRAVENOUS | Status: AC
  Filled 2021-01-27: qty 100

## 2021-01-27 MED ORDER — ONDANSETRON HCL 4 MG/2ML IJ SOLN
INTRAMUSCULAR | Status: AC | PRN
  Administered 2021-01-27: 4 mg via INTRAVENOUS

## 2021-01-27 MED ORDER — ONDANSETRON HCL 4 MG/2ML IJ SOLN
INTRAMUSCULAR | Status: AC
  Filled 2021-01-27: qty 2

## 2021-01-27 MED ORDER — MIDAZOLAM HCL 2 MG/2ML IJ SOLN
INTRAMUSCULAR | Status: AC
  Filled 2021-01-27: qty 2

## 2021-01-27 NOTE — Progress Notes (Signed)
Nutrition Follow-up  DOCUMENTATION CODES:   Severe malnutrition in context of chronic illness, Underweight  INTERVENTION:   -Once PEG is placed and ready to use, resume:   Continue Osmolite 1.2 @ 65 ml/hr via NGT   100 ml free water flush every 4 hours   Tube feeding regimen provides 1872 kcal (100% of needs), 87 grams of protein, and 1279 ml of H2O. Total free water: 1879 ml daily  -Plan transition to bolus feedings once pt able to establish tolerance of TF via PEG. Recommend:  237 ml (1 carton) Osmolite 1.2 6 times daily vua PEG  50 ml free water flush before and after each feeding administration  Tube feeding regimen provides 1710 kcal (100% of needs), 79 grams of protein, and 1170 ml of H2O.  Total free water: 1770 ml daily  NUTRITION DIAGNOSIS:   Severe Malnutrition related to chronic illness (chronic nausea/vomiting) as evidenced by severe fat depletion, severe muscle depletion, energy intake < or equal to 75% for > or equal to 1 month.  Ongoing  GOAL:   Patient will meet greater than or equal to 90% of their needs, Weight gain  Met with TF  MONITOR:   TF tolerance, Labs, Weight trends, Skin, I & O's  REASON FOR ASSESSMENT:   Consult Assessment of nutrition requirement/status, Diet education  ASSESSMENT:   45 yo female with a PMH of HTN, HLD, bipolar disorder, IBS, pancreatitis, interstitial cystitis, and COPD who presents with UTI. Vomiting over the last week, decreased PO intake.  10/14- cortrak unsuccessful 10/15- NGT placed by IR 10/16- s/p BSE- advanced to dysphagia 1 diet with thin liquids   Reviewed I/O's: -832 ml x 24 hours and +41.3 L x 24 hours  UOP: 1 L x 24 hours  Pt currently NPO. She was advanced to a dysphagia 1 diet, but eating very little (mostly liquids). Documented meal completion 0%.   Per MD, plan for PEG placement today.    Medications reviewed and include remeron.  Labs reviewed.   Diet Order:   Diet Order              Diet NPO time specified Except for: Sips with Meds  Diet effective midnight                   EDUCATION NEEDS:   Education needs have been addressed  Skin:  Skin Assessment: Reviewed RN Assessment  Last BM:  01/24/21  Height:   Ht Readings from Last 1 Encounters:  01/20/21 5' 4"  (1.626 m)    Weight:   Wt Readings from Last 1 Encounters:  01/25/21 39.3 kg   BMI:  Body mass index is 14.86 kg/m.  Estimated Nutritional Needs:   Kcal:  1550-1750  Protein:  75-90 grams  Fluid:  >1.8 L    Loistine Chance, RD, LDN, [place] Registered Dietitian II Certified Diabetes Care and Education Specialist Please refer to [facility] for RD and/or RD on-call/weekend/after hours pager

## 2021-01-27 NOTE — Plan of Care (Signed)

## 2021-01-27 NOTE — Progress Notes (Signed)
Patient ID: [name], female   DOB: 09-30-1975, 45 y.o.   MRN: [id number]  PROGRESS NOTE    FARIA CASELLA  JKD:[id number] DOB: [birth year] DOA: 01/20/2021 PCP: Neale Burly, MD   Brief Narrative:  45 y.o. female with medical history significant of HTN, HLD, chronic interstitial cystitis, COPD, pancreatitis, bipolar disorder, tobacco abuse, anxiety, and depression presented with worsening abdominal pain, nausea and vomiting.  Of note she is scheduled to undergo cystoscopy with hydrodistention/Botox injection procedure with Dr. Amalia Hailey of urology on 01/28/2021.  On presentation, CT scan of the abdomen and pelvis was negative for acute intra-abdominal/intrapelvic abnormality.  NG tube was placed for nutrition.  GI was consulted.  Assessment & Plan:   E. coli urinary tract infection: Present on admission History of chronic interstitial cystitis -Urine culture growing E. coli.  Continue amoxicillin to finish total of 7-day course of antibiotics -Patient is supposed to undergo cystoscopy with hydrodistention/Botox injection procedure with Dr. Amalia Hailey of urology on 01/28/2021.  This will have to be rescheduled.  Outpatient follow-up with urology.  Nausea and vomiting/dysphagia Severe malnutrition -Currently has NG tube for nutrition.  GI and SLP following. -Patient is complaining of ear pain after NG tube placement. -She has lost a lot of weight over the last 2 years and it is unclear if her oral intake will improve with NG feeding alone.   -MBS was negative for any oropharyngeal dysphagia.  GI recommended no need for barium swallow or repeat EGD and recommended to proceed directly to PEG tube placement by IR.  Patient is agreeable for PEG tube placement.  IR has been consulted.  Possible PEG tube placement by IR today. -Follow dietary recommendations  History of chronic pancreatitis -CT of the abdomen was unremarkable. -GI following  Hypoglycemia -Improved.  Continue tube feeding diet  Bipolar  disorder -PTA meds: Klonopin 1 tablet 3 times daily, mirtazapine 15 mg nightly, Trintellix 10 mg daily, and Seroquel 400 mg nightly Followed by Dr. Erling Cruz of psychiatry at [facility]. Per review of records she was last seen on 11/24/2020 with a note verifying mirtazapine and Klonopin, Trintellix 10 mg daily.   Continue Klonopin, mirtazapine,Trintellix, and Seroquel -anxiety appears to be playing a role with her symptoms  Chronic pain/fibromyalgia -Outpatient follow-up with pain management.  Continue as needed Dilaudid/oxycodone.  Tobacco abuse -Counseled regarding cessation by prior hospitalist.    DVT prophylaxis: Lovenox Code Status: Full Family Communication: Husband at bedside on 01/25/2021 Disposition Plan: Status is: Inpatient  Remains inpatient appropriate because: Severe malnutrition/still has NG tube.  Will need PEG tube placement  Consultants: GI/IR  Procedures: None  Antimicrobials: None   Subjective: Patient seen and examined at bedside.  No chest pain or worsening shortness of breath, vomiting reported.  Complains of intermittent nausea. Objective: Vitals:   01/26/21 0805 01/26/21 0806 01/26/21 0819 01/26/21 2007  BP:   98/71 105/77  Pulse:   81 73  Resp:    18  Temp:    97.8 F (36.6 C)  TempSrc:    Oral  SpO2: 96% 96% 100% 98%  Weight:      Height:        Intake/Output Summary (Last 24 hours) at 01/27/2021 0713 Last data filed at 01/27/2021 0006 Gross per 24 hour  Intake 168 ml  Output 1000 ml  Net -832 ml    Filed Weights   01/23/21 0543 01/24/21 0500 01/25/21 0425  Weight: 41.2 kg 39 kg 39.3 kg    Examination:  General exam: On  room air currently.  No acute distress.  Extremely thinly built.  Looks chronically ill and deconditioned.   ENT: NG tube present  respiratory system: Bilateral decreased breath sounds at bases Cardiovascular system: S1-S2 heard, rate controlled gastrointestinal system: Abdomen is slightly distended, soft and nontender.   Normal bowel sounds heard Extremities: Mild lower extremity edema; no cyanosis Central nervous system: Alert and oriented.  No focal neurological deficits.  Moving extremities  skin: No obvious petechiae/rashes psychiatry: Looks anxious intermittently  Data Reviewed: I have personally reviewed following labs and imaging studies  CBC: Recent Labs  Lab 01/23/21 0403 01/24/21 0407 01/25/21 0339 01/26/21 0359 01/27/21 0352  WBC 10.4 6.8 6.4 5.2 6.1  NEUTROABS 7.6 2.9 2.4 1.8 2.2  HGB 13.7 12.1 11.8* 11.9* 12.7  HCT 43.7 35.9* 35.6* 35.5* 38.8  MCV 92.2 95.0 96.2 95.2 96.3  PLT 208 323 302 313 629    Basic Metabolic Panel: Recent Labs  Lab 01/21/21 1445 01/21/21 1822 01/22/21 0427 01/23/21 0403 01/24/21 0407 01/25/21 0339 01/26/21 0359 01/27/21 0352  NA  --   --  139 141 139 139 136 137  K  --   --  4.0 3.9 4.1 4.1 4.0 4.6  CL  --   --  109 100 106 103 100 99  CO2  --   --  22 33* 26 29 27 30   GLUCOSE  --   --  56* 113* 108* 90 129* 92  BUN  --   --  <5* 15 5* 7 8 11   CREATININE  --   --  0.72 0.57 0.70 0.72 0.70 0.75  CALCIUM  --   --  8.8* 9.0 9.7 9.6 9.2 9.6  MG 1.8 1.8 1.9 2.1  --   --   --  2.1  PHOS 3.0 2.8 3.4 4.4  --   --   --   --     GFR: Estimated Creatinine Clearance: 55.7 mL/min (by C-G formula based on SCr of 0.75 mg/dL). Liver Function Tests: Recent Labs  Lab 01/23/21 0403 01/24/21 0407 01/25/21 0339 01/26/21 0359 01/27/21 0352  AST 18 16 34 15 17  ALT 21 15 12 12 13   ALKPHOS 92 65 63 66 66  BILITOT 0.4 0.3 0.3 0.3 0.3  PROT 6.1* 6.3* 6.3* 6.4* 6.8  ALBUMIN 3.3* 3.5 3.5 3.6 3.7    Recent Labs  Lab 01/20/21 1942 01/23/21 0403  LIPASE 53* 24    No results for input(s): AMMONIA in the last 168 hours. Coagulation Profile: Recent Labs  Lab 01/27/21 0352  INR 0.9   Cardiac Enzymes: No results for input(s): CKTOTAL, CKMB, CKMBINDEX, TROPONINI in the last 168 hours. BNP (last 3 results) No results for input(s): PROBNP in the last  8760 hours. HbA1C: No results for input(s): HGBA1C in the last 72 hours. CBG: Recent Labs  Lab 01/26/21 1129 01/26/21 1557 01/26/21 1955 01/27/21 0004 01/27/21 0405  GLUCAP 120* 107* 105* 99 97    Lipid Profile: No results for input(s): CHOL, HDL, LDLCALC, TRIG, CHOLHDL, LDLDIRECT in the last 72 hours. Thyroid Function Tests: No results for input(s): TSH, T4TOTAL, FREET4, T3FREE, THYROIDAB in the last 72 hours. Anemia Panel: No results for input(s): VITAMINB12, FOLATE, FERRITIN, TIBC, IRON, RETICCTPCT in the last 72 hours. Sepsis Labs: No results for input(s): PROCALCITON, LATICACIDVEN in the last 168 hours.  Recent Results (from the past 240 hour(s))  Resp Panel by RT-PCR (Flu A&B, Covid) Nasopharyngeal Swab     Status: None   Collection  Time: 01/21/21  4:31 AM   Specimen: Nasopharyngeal Swab; Nasopharyngeal(NP) swabs in vial transport medium  Result Value Ref Range Status   SARS Coronavirus 2 by RT PCR NEGATIVE NEGATIVE Final    Comment: (NOTE) SARS-CoV-2 target nucleic acids are NOT DETECTED.  The SARS-CoV-2 RNA is generally detectable in upper respiratory specimens during the acute phase of infection. The lowest concentration of SARS-CoV-2 viral copies this assay can detect is 138 copies/mL. A negative result does not preclude SARS-Cov-2 infection and should not be used as the sole basis for treatment or other patient management decisions. A negative result may occur with  improper specimen collection/handling, submission of specimen other than nasopharyngeal swab, presence of viral mutation(s) within the areas targeted by this assay, and inadequate number of viral copies(<138 copies/mL). A negative result must be combined with clinical observations, patient history, and epidemiological information. The expected result is Negative.  Fact Sheet for Patients:  EntrepreneurPulse.com.au  Fact Sheet for Healthcare Providers:   IncredibleEmployment.be  This test is no t yet approved or cleared by the [place] FDA and  has been authorized for detection and/or diagnosis of SARS-CoV-2 by FDA under an Emergency Use Authorization (EUA). This EUA will remain  in effect (meaning this test can be used) for the duration of the COVID-19 declaration under Section 564(b)(1) of the Act, 21 U.S.C.section 360bbb-3(b)(1), unless the authorization is terminated  or revoked sooner.       Influenza A by PCR NEGATIVE NEGATIVE Final   Influenza B by PCR NEGATIVE NEGATIVE Final    Comment: (NOTE) The Xpert Xpress SARS-CoV-2/FLU/RSV plus assay is intended as an aid in the diagnosis of influenza from Nasopharyngeal swab specimens and should not be used as a sole basis for treatment. Nasal washings and aspirates are unacceptable for Xpert Xpress SARS-CoV-2/FLU/RSV testing.  Fact Sheet for Patients: EntrepreneurPulse.com.au  Fact Sheet for Healthcare Providers: IncredibleEmployment.be  This test is not yet approved or cleared by the [place] FDA and has been authorized for detection and/or diagnosis of SARS-CoV-2 by FDA under an Emergency Use Authorization (EUA). This EUA will remain in effect (meaning this test can be used) for the duration of the COVID-19 declaration under Section 564(b)(1) of the Act, 21 U.S.C. section 360bbb-3(b)(1), unless the authorization is terminated or revoked.  Performed at [facility], [address] [place], [place] [address]   Urine Culture     Status: Abnormal   Collection Time: 01/21/21  9:45 AM   Specimen: Urine, Catheterized  Result Value Ref Range Status   Specimen Description URINE, CATHETERIZED  Final   Special Requests   Final    NONE Performed at [facility], [address], [place], [place] [address]    Culture >=100,000 COLONIES/mL ESCHERICHIA COLI (A)  Final   Report Status 01/24/2021 FINAL   Final   Organism ID, Bacteria ESCHERICHIA COLI (A)  Final      Susceptibility   Escherichia coli - MIC*    AMPICILLIN <=2 SENSITIVE Sensitive     CEFAZOLIN <=4 SENSITIVE Sensitive     CEFEPIME <=0.12 SENSITIVE Sensitive     CEFTRIAXONE <=0.25 SENSITIVE Sensitive     CIPROFLOXACIN <=0.25 SENSITIVE Sensitive     GENTAMICIN 4 SENSITIVE Sensitive     IMIPENEM <=0.25 SENSITIVE Sensitive     NITROFURANTOIN <=16 SENSITIVE Sensitive     TRIMETH/SULFA <=20 SENSITIVE Sensitive     AMPICILLIN/SULBACTAM <=2 SENSITIVE Sensitive     PIP/TAZO <=4 SENSITIVE Sensitive     * >=  100,000 COLONIES/mL ESCHERICHIA COLI          Radiology Studies: DG Swallowing Func-Speech Pathology  Result Date: 01/25/2021 Table formatting from the original result was not included. Objective Swallowing Evaluation: Type of Study: MBS-Modified Barium Swallow Study  Patient Details Name: DENAE ZULUETA MRN: [id number] Date of Birth: 1975-05-13 Today's Date: 01/25/2021 Time: SLP Start Time (ACUTE ONLY): 1315 -SLP Stop Time (ACUTE ONLY): 1829 SLP Time Calculation (min) (ACUTE ONLY): 32 min Past Medical History: Past Medical History: Diagnosis Date  Anal fissure 03/11/2009  Anemia   Anxiety and depression   ARTHRITIS 08/31/2007  Asthma   BENZODIAZEPINE ADDICTION 08/31/2007  Bipolar 1 disorder ([place])   Bowel obstruction (HCC)   Breakdown of urinary electronic stimulator device, init ([place])   BRONCHITIS, RECURRENT 08/23/2009  Asthmatic Bronchitis-Dr. Melvyn Novas.....-HFA 75% 12/04/2008>75% 02/05/2009>75% 08/04/2009 -PFT's 01/04/2009 2.56 (86%) ratio 75, no resp to B2 and DLC0 67% > 80 after correction   Cancer (HCC)   cervical cancer  Chronic interstitial cystitis 03/11/2009  Chronic nausea   Chronic pain   COLONIC POLYPS, HX OF 07/25/2006  ADENOMATOUS POLYP  COPD (chronic obstructive pulmonary disease) (HCC)   Endometriosis   FIBROMYALGIA 08/31/2007  GERD 02/05/2009  Hyperlipidemia   HYPERTENSION 08/31/2007  IBS 03/11/2009  Internal hemorrhoids    Migraine headache   NEPHROLITHIASIS 08/31/2007  Pancreatitis   PONV (postoperative nausea and vomiting)   RECTAL BLEEDING 03/11/2009  Sciatica   right leg  Seizures ([place])   been about 1 year since last seisure per pt  SLEEP APNEA 08/31/2007  Substance abuse ([place])   Thyroid disease   Uterine cyst  Past Surgical History: Past Surgical History: Procedure Laterality Date  ABDOMINAL HYSTERECTOMY    BIOPSY  10/25/2018  Procedure: BIOPSY;  Surgeon: Rogene Houston, MD;  [facility]: [facility];  Service: Endoscopy;;  gastric  BIOPSY  06/11/2020  Procedure: BIOPSY;  Surgeon: Montez Morita, Quillian Quince, MD;  Location: [facility];  Service: Gastroenterology;;  small bowel  bladder stretching x6    BLADDER SURGERY    stimulator placed and stretching   CHOLECYSTECTOMY    COLONOSCOPY    COLONOSCOPY WITH PROPOFOL N/A 09/03/2014  Procedure: COLONOSCOPY WITH PROPOFOL;  Surgeon: Milus Banister, MD;  Location: [facility];  Service: Endoscopy;  Laterality: N/A;  ESOPHAGOGASTRODUODENOSCOPY (EGD) WITH PROPOFOL N/A 10/25/2018  Procedure: ESOPHAGOGASTRODUODENOSCOPY (EGD) WITH PROPOFOL;  Surgeon: Rogene Houston, MD;  Location: [facility];  Service: Endoscopy;  Laterality: N/A;  11:15  ESOPHAGOGASTRODUODENOSCOPY (EGD) WITH PROPOFOL N/A 06/11/2020  Procedure: ESOPHAGOGASTRODUODENOSCOPY (EGD) WITH PROPOFOL;  Surgeon: Harvel Quale, MD;  Location: [facility];  Service: Gastroenterology;  Laterality: N/A;  interstitial cystitis    PACEMAKER INSERTION    in hip for interstitial cystitis  pacemaker removal    removal of uterine cyst and scrapped uterus    replaced bladder pacemaker   HPI: GRISEL BLUMENSTOCK is a 45 y.o. female who presented with complaints abdominal pain with nausea and vomiting over the last 2 weeks. Pt has had significant weight loss.  She reports difficulty going back 2 years with worsening in past 2 months resulting in inability to eat any solid foods.  Lower lungs clear on Ab CT 10/13.  Pt has been seen by GI in  the past and has had multiple normal EGDs. Most recent in [facility] documentation 06/2020 was negative  except for Candida esophagitis. Per chart she has had multiple days without BM at times, but that this is not normal for her.  She reports hx of IBS.  Pt with medical history significant of hypertension, hyperlipidemia, chronic interstitial cystitis, COPD, pancreatitis, bipolar disorder, tobacco abuse, anxiety, and depression.  Subjective: Pt awake, alert, pleasant, participative Assessment / Plan / Recommendation CHL IP CLINICAL IMPRESSIONS 01/25/2021 Clinical Impression Pt was seen for an MBS. Overall, pt presents with functional oral and pharyngeal swallow across all consistencies. Pt reported chronic nausea and difficulty keeping food down for two years, exhibiting hesitancy to swallow barium d/t fear of emesis. SLP provided encouragement and compensatory strategies for tolerating barium (small sips, quick oral phase, etc.), and provided pt with emesis bag to elicit relaxation. Pt tolerated small sips and bites of thin liquid, puree, mechanical soft, and regular solids exhibiting no oral phase or pharyngeal phase impairments. Pt did report moderate odynophagia and globus sensation in throat after presentations of solids and SLP showed pt evidence of clear imaging exhibiting no pharyngeal residue. MBS does not diagnose below the level of the UES however scan revealed what appeared to be timely transit to stomach without residue. Pt needs no acute SLP follow up. Recommend continuing Dys1/thin liquid diet d/t pt discomfort re: swallowing solids. SLP Visit Diagnosis Dysphagia, unspecified (R13.10) Attention and concentration deficit following -- Frontal lobe and executive function deficit following -- Impact on safety and function Mild aspiration risk   CHL IP TREATMENT RECOMMENDATION 01/25/2021 Treatment Recommendations No treatment recommended at this time   Prognosis 01/23/2021 Prognosis for Safe Diet Advancement (No  Data) Barriers to Reach Goals -- Barriers/Prognosis Comment -- CHL IP DIET RECOMMENDATION 01/25/2021 SLP Diet Recommendations Dysphagia 1 (Puree) solids;Thin liquid Liquid Administration via Cup;Straw Medication Administration Crushed with puree Compensations Small sips/bites Postural Changes Remain semi-upright after after feeds/meals (Comment);Seated upright at 90 degrees   CHL IP OTHER RECOMMENDATIONS 01/25/2021 Recommended Consults -- Oral Care Recommendations Oral care BID Other Recommendations --   CHL IP FOLLOW UP RECOMMENDATIONS 01/25/2021 Follow up Recommendations None   CHL IP FREQUENCY AND DURATION 01/23/2021 Speech Therapy Frequency (ACUTE ONLY) min 2x/week Treatment Duration 2 weeks      CHL IP ORAL PHASE 01/25/2021 Oral Phase WFL Oral - Pudding Teaspoon -- Oral - Pudding Cup -- Oral - Honey Teaspoon -- Oral - Honey Cup -- Oral - Nectar Teaspoon -- Oral - Nectar Cup -- Oral - Nectar Straw -- Oral - Thin Teaspoon -- Oral - Thin Cup -- Oral - Thin Straw -- Oral - Puree -- Oral - Mech Soft -- Oral - Regular -- Oral - Multi-Consistency -- Oral - Pill -- Oral Phase - Comment --  CHL IP PHARYNGEAL PHASE 01/25/2021 Pharyngeal Phase WFL Pharyngeal- Pudding Teaspoon -- Pharyngeal -- Pharyngeal- Pudding Cup -- Pharyngeal -- Pharyngeal- Honey Teaspoon -- Pharyngeal -- Pharyngeal- Honey Cup -- Pharyngeal -- Pharyngeal- Nectar Teaspoon -- Pharyngeal -- Pharyngeal- Nectar Cup -- Pharyngeal -- Pharyngeal- Nectar Straw -- Pharyngeal -- Pharyngeal- Thin Teaspoon -- Pharyngeal -- Pharyngeal- Thin Cup -- Pharyngeal -- Pharyngeal- Thin Straw -- Pharyngeal -- Pharyngeal- Puree -- Pharyngeal -- Pharyngeal- Mechanical Soft -- Pharyngeal -- Pharyngeal- Regular -- Pharyngeal -- Pharyngeal- Multi-consistency -- Pharyngeal -- Pharyngeal- Pill -- Pharyngeal -- Pharyngeal Comment --  CHL IP CERVICAL ESOPHAGEAL PHASE 01/25/2021 Cervical Esophageal Phase WFL Pudding Teaspoon -- Pudding Cup -- Honey Teaspoon -- Honey Cup -- Nectar  Teaspoon -- Nectar Cup -- Nectar Straw -- Thin Teaspoon -- Thin Cup -- Thin Straw -- Puree -- Mechanical Soft -- Regular -- Multi-consistency -- Pill -- Cervical Esophageal Comment -- [place] Siren 01/25/2021, 4:16 PM  Scheduled Meds:  amoxicillin  500 mg Per Tube Q8H   arformoterol  15 mcg Nebulization BID   budesonide (PULMICORT) nebulizer solution  0.5 mg Nebulization BID   Chlorhexidine Gluconate Cloth  6 each Topical Daily   cycloSPORINE  1 drop Both Eyes BID   [START ON 01/28/2021] enoxaparin (LOVENOX) injection  30 mg Subcutaneous Q24H   estradiol  2 mg Oral Daily   fluticasone  2 spray Each Nare Daily   free water  100 mL Per Tube Q4H   gabapentin  800 mg Oral QID   guaiFENesin  600 mg Oral BID   hydrOXYzine  50 mg Oral QHS   mirtazapine  15 mg Oral QHS   neomycin-polymyxin-hydrocortisone  3 drop Both EARS Q8H   nicotine  21 mg Transdermal Daily   oxybutynin  15 mg Oral Daily   pantoprazole (PROTONIX) IV  40 mg Intravenous Q12H   QUEtiapine  400 mg Oral QHS   sodium chloride flush  3 mL Intravenous Q12H   vortioxetine HBr  10 mg Oral Daily   Continuous Infusions:  feeding supplement (OSMOLITE 1.2 CAL) Stopped (01/27/21 0427)   promethazine (PHENERGAN) injection (IM or IVPB) 12.5 mg (01/26/21 1613)          Aline August, MD [facility] 01/27/2021, 7:13 AM

## 2021-01-27 NOTE — Procedures (Signed)
Interventional Radiology Procedure Note  Date of Procedure: 01/27/2021  Procedure: G tube placement   Findings:  1. Placement of 20 Fr G tube using fluoroscopy    Complications: No immediate complications noted.   Estimated Blood Loss: minimal  Follow-up and Recommendations: 1. Tube may be used immediately for meds, and next day for tube feeds    Albin Felling, MD  Vascular & Interventional Radiology  01/27/2021 12:54 PM

## 2021-01-28 ENCOUNTER — Inpatient Hospital Stay (HOSPITAL_COMMUNITY): Payer: BC Managed Care – PPO

## 2021-01-28 DIAGNOSIS — N390 Urinary tract infection, site not specified: Secondary | ICD-10-CM | POA: Diagnosis not present

## 2021-01-28 DIAGNOSIS — R079 Chest pain, unspecified: Secondary | ICD-10-CM | POA: Diagnosis not present

## 2021-01-28 DIAGNOSIS — R6510 Systemic inflammatory response syndrome (SIRS) of non-infectious origin without acute organ dysfunction: Secondary | ICD-10-CM

## 2021-01-28 DIAGNOSIS — Z8719 Personal history of other diseases of the digestive system: Secondary | ICD-10-CM | POA: Diagnosis not present

## 2021-01-28 DIAGNOSIS — N3010 Interstitial cystitis (chronic) without hematuria: Secondary | ICD-10-CM | POA: Diagnosis not present

## 2021-01-28 LAB — CBC WITH DIFFERENTIAL/PLATELET
Abs Immature Granulocytes: 0.02 10*3/uL (ref 0.00–0.07)
Abs Immature Granulocytes: 0.02 10*3/uL (ref 0.00–0.07)
Basophils Absolute: 0 10*3/uL (ref 0.0–0.1)
Basophils Absolute: 0 10*3/uL (ref 0.0–0.1)
Basophils Relative: 0 %
Basophils Relative: 0 %
Eosinophils Absolute: 0.2 10*3/uL (ref 0.0–0.5)
Eosinophils Absolute: 0.1 10*3/uL (ref 0.0–0.5)
Eosinophils Relative: 2 %
Eosinophils Relative: 1 %
HCT: 36 % (ref 36.0–46.0)
HCT: 34.8 % — ABNORMAL LOW (ref 36.0–46.0)
Hemoglobin: 12.2 g/dL (ref 12.0–15.0)
Hemoglobin: 11.7 g/dL — ABNORMAL LOW (ref 12.0–15.0)
Immature Granulocytes: 0 %
Immature Granulocytes: 0 %
Lymphocytes Relative: 28 %
Lymphocytes Relative: 26 %
Lymphs Abs: 2.4 10*3/uL (ref 0.7–4.0)
Lymphs Abs: 2 10*3/uL (ref 0.7–4.0)
MCH: 32 pg (ref 26.0–34.0)
MCH: 32.2 pg (ref 26.0–34.0)
MCHC: 33.9 g/dL (ref 30.0–36.0)
MCHC: 33.6 g/dL (ref 30.0–36.0)
MCV: 94.5 fL (ref 80.0–100.0)
MCV: 95.9 fL (ref 80.0–100.0)
Monocytes Absolute: 0.7 10*3/uL (ref 0.1–1.0)
Monocytes Absolute: 0.6 10*3/uL (ref 0.1–1.0)
Monocytes Relative: 9 %
Monocytes Relative: 8 %
Neutro Abs: 5.3 10*3/uL (ref 1.7–7.7)
Neutro Abs: 5.1 10*3/uL (ref 1.7–7.7)
Neutrophils Relative %: 61 %
Neutrophils Relative %: 65 %
Platelets: 280 10*3/uL (ref 150–400)
Platelets: 237 10*3/uL (ref 150–400)
RBC: 3.81 MIL/uL — ABNORMAL LOW (ref 3.87–5.11)
RBC: 3.63 MIL/uL — ABNORMAL LOW (ref 3.87–5.11)
RDW: 12.1 % (ref 11.5–15.5)
RDW: 11.9 % (ref 11.5–15.5)
WBC: 8.6 10*3/uL (ref 4.0–10.5)
WBC: 7.8 10*3/uL (ref 4.0–10.5)
nRBC: 0 % (ref 0.0–0.2)
nRBC: 0 % (ref 0.0–0.2)

## 2021-01-28 LAB — GLUCOSE, CAPILLARY
Glucose-Capillary: 113 mg/dL — ABNORMAL HIGH (ref 70–99)
Glucose-Capillary: 116 mg/dL — ABNORMAL HIGH (ref 70–99)
Glucose-Capillary: 149 mg/dL — ABNORMAL HIGH (ref 70–99)
Glucose-Capillary: 170 mg/dL — ABNORMAL HIGH (ref 70–99)
Glucose-Capillary: 99 mg/dL (ref 70–99)

## 2021-01-28 LAB — COMPREHENSIVE METABOLIC PANEL
ALT: 17 U/L (ref 0–44)
AST: 23 U/L (ref 15–41)
Albumin: 3.4 g/dL — ABNORMAL LOW (ref 3.5–5.0)
Alkaline Phosphatase: 61 U/L (ref 38–126)
Anion gap: 9 (ref 5–15)
BUN: 10 mg/dL (ref 6–20)
CO2: 24 mmol/L (ref 22–32)
Calcium: 8.7 mg/dL — ABNORMAL LOW (ref 8.9–10.3)
Chloride: 97 mmol/L — ABNORMAL LOW (ref 98–111)
Creatinine, Ser: 0.75 mg/dL (ref 0.44–1.00)
GFR, Estimated: >60 mL/min (ref >60)
Glucose, Bld: 168 mg/dL — ABNORMAL HIGH (ref 70–99)
Potassium: 3.1 mmol/L — ABNORMAL LOW (ref 3.5–5.1)
Sodium: 130 mmol/L — ABNORMAL LOW (ref 135–145)
Total Bilirubin: 0.4 mg/dL (ref 0.3–1.2)
Total Protein: 6.7 g/dL (ref 6.5–8.1)

## 2021-01-28 LAB — PROTIME-INR
INR: 1 (ref 0.8–1.2)
Prothrombin Time: 13.7 seconds (ref 11.4–15.2)

## 2021-01-28 LAB — RAPID URINE DRUG SCREEN, HOSP PERFORMED
Amphetamines: NOT DETECTED
Barbiturates: NOT DETECTED
Benzodiazepines: POSITIVE — AB
Cocaine: NOT DETECTED
Opiates: POSITIVE — AB
Tetrahydrocannabinol: POSITIVE — AB

## 2021-01-28 LAB — LACTIC ACID, PLASMA: Lactic Acid, Venous: 1.4 mmol/L (ref 0.5–1.9)

## 2021-01-28 LAB — APTT: aPTT: 34 seconds (ref 24–36)

## 2021-01-28 LAB — TROPONIN I (HIGH SENSITIVITY)
Troponin I (High Sensitivity): <2 ng/L (ref <18)
Troponin I (High Sensitivity): <2 ng/L (ref <18)

## 2021-01-28 MED ORDER — SODIUM CHLORIDE 0.9 % IV SOLN: INTRAVENOUS | Status: DC | PRN

## 2021-01-28 MED ORDER — LACTATED RINGERS IV BOLUS (SEPSIS): 1000.0000 mL | Freq: Once | INTRAVENOUS | Status: DC

## 2021-01-28 MED ORDER — METRONIDAZOLE 500 MG/100ML IV SOLN
500.0000 mg | Freq: Two times a day (BID) | INTRAVENOUS | Status: DC
Start: 2021-01-28 — End: 2021-01-30
  Administered 2021-01-28 – 2021-01-30 (×4): 500 mg via INTRAVENOUS
  Filled 2021-01-28 (×4): qty 100

## 2021-01-28 MED ORDER — OSMOLITE 1.2 CAL PO LIQD
237.0000 mL | Freq: Every day | ORAL | Status: DC
  Administered 2021-01-29 – 2021-02-01 (×18): 237 mL
  Filled 2021-01-28 (×24): qty 237

## 2021-01-28 MED ORDER — VANCOMYCIN HCL IN DEXTROSE 1-5 GM/200ML-% IV SOLN
1000.0000 mg | Freq: Once | INTRAVENOUS | Status: AC
  Administered 2021-01-28: 1000 mg via INTRAVENOUS
  Filled 2021-01-28: qty 200

## 2021-01-28 MED ORDER — SODIUM CHLORIDE 0.9 % IV SOLN
2.0000 g | Freq: Three times a day (TID) | INTRAVENOUS | Status: DC
  Administered 2021-01-29 – 2021-01-30 (×6): 2 g via INTRAVENOUS
  Filled 2021-01-28 (×8): qty 2

## 2021-01-28 MED ORDER — FREE WATER
100.0000 mL | Freq: Every day | Status: DC
  Administered 2021-01-29 – 2021-02-01 (×19): 100 mL

## 2021-01-28 MED ORDER — VANCOMYCIN HCL 500 MG/100ML IV SOLN
500.0000 mg | Freq: Two times a day (BID) | INTRAVENOUS | Status: DC
Start: 2021-01-29 — End: 2021-01-29
  Administered 2021-01-29: 500 mg via INTRAVENOUS
  Filled 2021-01-28 (×2): qty 100

## 2021-01-28 NOTE — Plan of Care (Signed)

## 2021-01-28 NOTE — Progress Notes (Signed)
Pharmacy Antibiotic Note  [name] is a 45 y.o. female admitted on 01/20/2021 with sepsis of unknown source.  Pharmacy has been consulted for Vancomycin/Cefepime dosing.  Recent treatment of Ecoli UTI with CTX>>Augmentin Acute decompensation with unknown source  Plan: Vancomycin 1000mg  LD x1 followed by vancomycin 500mg  q12hr (eAUC 505) Cefepime 2gm q8hr and metronidazole q12hr Plan to obtain levels at steady state  Will monitor for acute changes in renal function and adjust as needed F/u cultures results and de-escalate as appropriate   Height: 5\' 4"  (162.6 cm) Weight: 39.3 kg (86 lb 9.6 oz) (scale b) IBW/kg (Calculated) : 54.7  Temp (24hrs), Avg:100.1 F (37.8 C), Min:99 F (37.2 C), Max:100.8 F (38.2 C)  Recent Labs  Lab 01/23/21 0403 01/24/21 0407 01/25/21 0339 01/26/21 0359 01/27/21 0352 01/28/21 0709  WBC 10.4 6.8 6.4 5.2 6.1 8.6  CREATININE 0.57 0.70 0.72 0.70 0.75  --     Estimated Creatinine Clearance: 55.7 mL/min (by C-G formula based on SCr of 0.75 mg/dL).    Allergies  Allergen Reactions   Abilify [Aripiprazole] Swelling, Palpitations and Other (See Comments)    Throat swelling, tremors    Amitriptyline Anaphylaxis    Angioedema   Metoclopramide Hcl Other (See Comments)    Causes seizures   Propoxyphene Rash   Toradol [Ketorolac Tromethamine]    Tramadol Swelling, Other (See Comments) and Rash    Throat swelling, tremors   Ambien [Zolpidem Tartrate] Other (See Comments)    hallucinations   Eszopiclone Other (See Comments)    Hallucinations, hyperactivity, and bad taste in mouth    Varenicline Other (See Comments)    Suicidal thoughts   Buprenorphine Hcl Itching and Hives   Demerol [Meperidine] Rash   Emetrol Hives, Itching and Rash   Morphine And Related Hives and Itching    Patient states she is able to tolerate Dilaudid.    Antimicrobials this admission: Ceftriaxone 10/14 >> 10/19 Augmentin 10/19 >> 10/20 Vancomycin  10/21>> Cefepime 10/21 >> Metronidazole 10/21 >>  Dose adjustments this admission: none  Microbiology results: 10/21 BCx: IP 10/21 UCx: IP   Thank you for allowing pharmacy to be a part of this patient's care.  Donnald Garre, PharmD Clinical Pharmacist  Please check AMION for all [place] numbers After 10:00 PM, call [place] [phone]

## 2021-01-28 NOTE — Sepsis Progress Note (Signed)
Monitoring for code sepsis protocol. 

## 2021-01-28 NOTE — Progress Notes (Signed)
   01/28/21 2009  Assess: MEWS Score  Temp (!) 100.8 F (38.2 C)  BP 102/71  Pulse Rate (!) 132  ECG Heart Rate (!) 137  SpO2 95 %  O2 Device Room Air  Assess: MEWS Score  MEWS Temp 1  MEWS Systolic 0  MEWS Pulse 3  MEWS RR 0  MEWS LOC 0  MEWS Score 4  MEWS Score Color Red  Assess: if the MEWS score is Yellow or Red  Were vital signs taken at a resting state? Yes  Focused Assessment No change from prior assessment  Early Detection of Sepsis Score *See Row Information* Low  MEWS guidelines implemented *See Row Information* Yes  Treat  MEWS Interventions Escalated (See documentation below);Administered prn meds/treatments  Pain Scale 0-10  Pain Score 10  Take Vital Signs  Increase Vital Sign Frequency  Red: Q 1hr X 4 then Q 4hr X 4, if remains red, continue Q 4hrs  Escalate  MEWS: Escalate Red: discuss with charge nurse/RN and provider, consider discussing with RRT  Notify: Charge Nurse/RN  Name of Charge Nurse/RN Notified Jaquetta RN  Date Charge Nurse/RN Notified 01/28/21  Time Charge Nurse/RN Notified 2115  Notify: Provider  Provider Name/Title Dr. Myna Hidalgo  Date Provider Notified 01/28/21  Time Provider Notified 2116  Notification Type Page  Notification Reason Change in status  Provider response See new orders  Date of Provider Response 01/28/21  Time of Provider Response 2116

## 2021-01-28 NOTE — Progress Notes (Signed)
Cross-coverage note:   Patient seen for red MEWS (new fever, continued tachycardia).   She completed treatment for E coli UTI this admission, had g-tube placed by IR yesterday, was noted to have new fever tonight, but no other new complaints or obvious source.   In addition to fever, HR is up to 130s (sinus).  SBP 90s-low 100s.   Patient examined, is alert and fully oriented, does not appear toxic, and in no respiratory distress.   Plan to obtain blood cultures, check CBC, lactate, UA, and CXR, and treat with a fluid bolus and empiric antibiotics for now while working this up.

## 2021-01-28 NOTE — Progress Notes (Signed)
Nutrition Follow-up  DOCUMENTATION CODES:   Severe malnutrition in context of chronic illness, Underweight  INTERVENTION:   Resume Osmolite 1.2 @ 65 ml/hr via PEG   100 ml free water flush every 4 hours   Tube feeding regimen provides 1872 kcal (100% of needs), 87 grams of protein, and 1279 ml of H2O. Total free water: 1879 ml daily  -On 01/29/21, transition to bolus feeds:  237 ml (1 carton) Osmolite 1.2 6 times daily via PEG   50 ml free water flush before and after each feeding administration   Tube feeding regimen provides 1710 kcal (100% of needs), 79 grams of protein, and 1170 ml of H2O.  Total free water: 1770 ml daily  NUTRITION DIAGNOSIS:   Severe Malnutrition related to chronic illness (chronic nausea/vomiting) as evidenced by severe fat depletion, severe muscle depletion, energy intake < or equal to 75% for > or equal to 1 month.  Ongoing  GOAL:   Patient will meet greater than or equal to 90% of their needs, Weight gain  Progressing   MONITOR:   TF tolerance, Labs, Weight trends, Skin, I & O's  REASON FOR ASSESSMENT:   Consult Assessment of nutrition requirement/status, Diet education  ASSESSMENT:   45 yo female with a PMH of HTN, HLD, bipolar disorder, IBS, pancreatitis, interstitial cystitis, and COPD who presents with UTI. Vomiting over the last week, decreased PO intake.  10/14- cortrak unsuccessful 10/15- NGT placed by IR 10/16- s/p BSE- advanced to dysphagia 1 diet with thin liquids 10/20- PEG placed   Case discussed with MD; plan to resume TF today. RD will re-start TF today and transition to bolus feedings over the weekend ti prepare for discharge.   Labs reviewed: CBGS: 113 (inpatient orders for glycemic control are none).    Diet Order:   Diet Order             Diet NPO time specified Except for: Sips with Meds  Diet effective midnight                   EDUCATION NEEDS:   Education needs have been addressed  Skin:  Skin  Assessment: Reviewed RN Assessment  Last BM:  01/26/21  Height:   Ht Readings from Last 1 Encounters:  01/20/21 5\' 4"  (1.626 m)    Weight:   Wt Readings from Last 1 Encounters:  01/25/21 39.3 kg   BMI:  Body mass index is 14.86 kg/m.  Estimated Nutritional Needs:   Kcal:  1550-1750  Protein:  75-90 grams  Fluid:  >1.8 L    Loistine Chance, RD, LDN, [place] Registered Dietitian II Certified Diabetes Care and Education Specialist Please refer to [facility] for RD and/or RD on-call/weekend/after hours pager

## 2021-01-28 NOTE — Progress Notes (Signed)
Patient seen for g-tube site s/p placement of 20 Fr balloon retention gastrostomy tube on 10.21.22 in IR with Dr. Denna Haggard   Insertion site is clean, dry, dressed and appropriately tender to palpation. Tube is capped.  No bleeding or leakage. Per husband at bedside tube has not yet been used.    Ok to begin using g-tube for medicines, tube feeds, free water, etc.  Patient will need to have  t-tacks removed in approximately 1 week. Please call IR for any questions or concerns regarding the g-tube.

## 2021-01-28 NOTE — Progress Notes (Signed)
Patient ID: [name], female   DOB: Mar 13, 1976, 45 y.o.   MRN: [id number]  PROGRESS NOTE    [name]  NFA:[id number] DOB: 01/21/1976 DOA: 01/20/2021 PCP: Neale Burly, MD   Brief Narrative:  45 y.o. female with medical history significant of HTN, HLD, chronic interstitial cystitis, COPD, pancreatitis, bipolar disorder, tobacco abuse, anxiety, and depression presented with worsening abdominal pain, nausea and vomiting.  Of note she is scheduled to undergo cystoscopy with hydrodistention/Botox injection procedure with Dr. Amalia Hailey of urology on 01/28/2021.  On presentation, CT scan of the abdomen and pelvis was negative for acute intra-abdominal/intrapelvic abnormality.  NG tube was placed for nutrition.  GI was consulted.  GI recommended PEG tube placement and signed off.  She underwent PEG tube placement on 01/27/2021 by IR.  Assessment & Plan:   Nausea and vomiting/dysphagia Severe malnutrition -Currently has NG tube for nutrition.  GI and SLP following. -Patient is complaining of ear pain after NG tube placement. -She has lost a lot of weight over the last 2 years and it is unclear if her oral intake will improve with NG feeding alone.   -MBS was negative for any oropharyngeal dysphagia.  GI recommended no need for barium swallow or repeat EGD and recommended to proceed directly to PEG tube placement by IR.  -status post PEG tube placement by IR on 01/27/2021 -Patient complains of pain in her abdomen and her chest and is crying although her abdominal exam looks benign.  We will check chest x-ray and abdominal x-ray.  Will wait for IR evaluation before starting feeding. -Check EKG and troponins as well  E. coli urinary tract infection: Present on admission History of chronic interstitial cystitis -Urine culture growing E. coli.  Continue amoxicillin to finish total of 7-day course of antibiotics -Patient is supposed to undergo cystoscopy with hydrodistention/Botox injection procedure  with Dr. Amalia Hailey of urology on 01/28/2021.  This will have to be rescheduled.  Outpatient follow-up with urology.  History of chronic pancreatitis -CT of the abdomen was unremarkable. -GI has already signed off.  Outpatient follow-up with GI.  Hypoglycemia -Improved.  Start tube feeding once able.  Bipolar disorder -PTA meds: Klonopin 1 tablet 3 times daily, mirtazapine 15 mg nightly, Trintellix 10 mg daily, and Seroquel 400 mg nightly Followed by Dr. Erling Cruz of psychiatry at [facility]. Per review of records she was last seen on 11/24/2020 with a note verifying mirtazapine and Klonopin, Trintellix 10 mg daily.   Continue Klonopin, mirtazapine,Trintellix, and Seroquel -anxiety appears to be playing a role with her symptoms  Chronic pain/fibromyalgia -Outpatient follow-up with pain management.  Continue as needed Dilaudid/oxycodone.  Tobacco abuse -Counseled regarding cessation by prior hospitalist.    DVT prophylaxis: Lovenox Code Status: Full Family Communication: Husband at bedside on 01/28/2021 Disposition Plan: Status is: Inpatient  Remains inpatient appropriate because: We will assess for initiation of PEG tube feeding and tolerance  Consultants: GI/IR  Procedures: PEG tube placement by IR on 01/27/2021  Antimicrobials:  Anti-infectives (From admission, onward)    Start     Dose/Rate Route Frequency Ordered Stop   01/27/21 2200  amoxicillin (AMOXIL) capsule 500 mg        500 mg Oral Every 8 hours 01/27/21 1405 01/27/21 2213   01/27/21 1154  ceFAZolin (ANCEF) 2-4 GM/100ML-% IVPB       Note to Pharmacy: Domenick Bookbinder   : cabinet override      01/27/21 1154 01/27/21 2359   01/26/21 0600  amoxicillin (AMOXIL) 250  MG/5ML suspension 500 mg  Status:  Discontinued        500 mg Per Tube Every 8 hours 01/25/21 1229 01/25/21 1724   01/26/21 0600  amoxicillin (AMOXIL) 250 MG/5ML suspension 500 mg  Status:  Discontinued        500 mg Per Tube Every 8 hours 01/25/21 1717 01/27/21 1405    01/21/21 1030  cefTRIAXone (ROCEPHIN) 1 g in sodium chloride 0.9 % 100 mL IVPB  Status:  Discontinued        1 g 200 mL/hr over 30 Minutes Intravenous Every 24 hours 01/21/21 0936 01/25/21 1229         Subjective: Patient seen and examined at bedside.  Complains of pain in her chest while take deep breaths and also complains of pain in her abdomen.  Denies any vomiting.  Had fever this morning.   Objective: Vitals:   01/27/21 2009 01/27/21 2018 01/28/21 0442 01/28/21 0619  BP: 115/86  107/76 105/74  Pulse: 96  (!) 111 (!) 113  Resp: 20  15   Temp: 98.4 F (36.9 C)  99 F (37.2 C)   TempSrc: Oral  Oral   SpO2: 99% 98%  100%  Weight:      Height:       No intake or output data in the 24 hours ending 01/28/21 0708  Filed Weights   01/24/21 0500 01/25/21 0425  Weight: 39 kg 39.3 kg    Examination:  General exam: Crying intermittently.  Currently on room air.  Extremely thinly built.  Looks chronically ill and deconditioned.   ENT: NG tube has been removed. respiratory system: Decreased breath sounds at bases bilaterally Cardiovascular system: Intermittently tachycardic, S1-S2 heard gastrointestinal system: Abdomen is distended mildly, soft and mildly tender around the PEG tube site with dressing present.  Bowel sounds are heard extremities: No clubbing; trace lower extremity edema present  Central nervous system: Awake and alert.  No focal neurological deficits.  Moves extremities  skin: No obvious ecchymosis/rashes psychiatry: Looks anxious.  Crying intermittently  Data Reviewed: I have personally reviewed following labs and imaging studies  CBC: Recent Labs  Lab 01/23/21 0403 01/24/21 0407 01/25/21 0339 01/26/21 0359 01/27/21 0352  WBC 10.4 6.8 6.4 5.2 6.1  NEUTROABS 7.6 2.9 2.4 1.8 2.2  HGB 13.7 12.1 11.8* 11.9* 12.7  HCT 43.7 35.9* 35.6* 35.5* 38.8  MCV 92.2 95.0 96.2 95.2 96.3  PLT 208 323 302 313 301    Basic Metabolic Panel: Recent Labs  Lab  01/21/21 1445 01/21/21 1822 01/22/21 0427 01/22/21 0427 01/23/21 0403 01/24/21 0407 01/25/21 0339 01/26/21 0359 01/27/21 0352  NA  --   --  139   < > 141 139 139 136 137  K  --   --  4.0   < > 3.9 4.1 4.1 4.0 4.6  CL  --   --  109   < > 100 106 103 100 99  CO2  --   --  22   < > 33* 26 29 27 30   GLUCOSE  --   --  56*   < > 113* 108* 90 129* 92  BUN  --   --  <5*   < > 15 5* 7 8 11   CREATININE  --   --  0.72   < > 0.57 0.70 0.72 0.70 0.75  CALCIUM  --   --  8.8*   < > 9.0 9.7 9.6 9.2 9.6  MG 1.8 1.8 1.9  --  2.1  --   --   --  2.1  PHOS 3.0 2.8 3.4  --  4.4  --   --   --   --    < > = values in this interval not displayed.    GFR: Estimated Creatinine Clearance: 55.7 mL/min (by C-G formula based on SCr of 0.75 mg/dL). Liver Function Tests: Recent Labs  Lab 01/23/21 0403 01/24/21 0407 01/25/21 0339 01/26/21 0359 01/27/21 0352  AST 18 16 34 15 17  ALT 21 15 12 12 13   ALKPHOS 92 65 63 66 66  BILITOT 0.4 0.3 0.3 0.3 0.3  PROT 6.1* 6.3* 6.3* 6.4* 6.8  ALBUMIN 3.3* 3.5 3.5 3.6 3.7    Recent Labs  Lab 01/23/21 0403  LIPASE 24    No results for input(s): AMMONIA in the last 168 hours. Coagulation Profile: Recent Labs  Lab 01/27/21 0352  INR 0.9    Cardiac Enzymes: No results for input(s): CKTOTAL, CKMB, CKMBINDEX, TROPONINI in the last 168 hours. BNP (last 3 results) No results for input(s): PROBNP in the last 8760 hours. HbA1C: No results for input(s): HGBA1C in the last 72 hours. CBG: Recent Labs  Lab 01/27/21 1336 01/27/21 1633 01/27/21 2005 01/28/21 0048 01/28/21 0439  GLUCAP 69* 97 106* 99 116*    Lipid Profile: No results for input(s): CHOL, HDL, LDLCALC, TRIG, CHOLHDL, LDLDIRECT in the last 72 hours. Thyroid Function Tests: No results for input(s): TSH, T4TOTAL, FREET4, T3FREE, THYROIDAB in the last 72 hours. Anemia Panel: No results for input(s): VITAMINB12, FOLATE, FERRITIN, TIBC, IRON, RETICCTPCT in the last 72 hours. Sepsis Labs: No  results for input(s): PROCALCITON, LATICACIDVEN in the last 168 hours.  Recent Results (from the past 240 hour(s))  Resp Panel by RT-PCR (Flu A&B, Covid) Nasopharyngeal Swab     Status: None   Collection Time: 01/21/21  4:31 AM   Specimen: Nasopharyngeal Swab; Nasopharyngeal(NP) swabs in vial transport medium  Result Value Ref Range Status   SARS Coronavirus 2 by RT PCR NEGATIVE NEGATIVE Final    Comment: (NOTE) SARS-CoV-2 target nucleic acids are NOT DETECTED.  The SARS-CoV-2 RNA is generally detectable in upper respiratory specimens during the acute phase of infection. The lowest concentration of SARS-CoV-2 viral copies this assay can detect is 138 copies/mL. A negative result does not preclude SARS-Cov-2 infection and should not be used as the sole basis for treatment or other patient management decisions. A negative result may occur with  improper specimen collection/handling, submission of specimen other than nasopharyngeal swab, presence of viral mutation(s) within the areas targeted by this assay, and inadequate number of viral copies(<138 copies/mL). A negative result must be combined with clinical observations, patient history, and epidemiological information. The expected result is Negative.  Fact Sheet for Patients:  EntrepreneurPulse.com.au  Fact Sheet for Healthcare Providers:  IncredibleEmployment.be  This test is no t yet approved or cleared by the [place] FDA and  has been authorized for detection and/or diagnosis of SARS-CoV-2 by FDA under an Emergency Use Authorization (EUA). This EUA will remain  in effect (meaning this test can be used) for the duration of the COVID-19 declaration under Section 564(b)(1) of the Act, 21 U.S.C.section 360bbb-3(b)(1), unless the authorization is terminated  or revoked sooner.       Influenza A by PCR NEGATIVE NEGATIVE Final   Influenza B by PCR NEGATIVE NEGATIVE Final    Comment:  (NOTE) The Xpert Xpress SARS-CoV-2/FLU/RSV plus assay is intended as an aid in the diagnosis of  influenza from Nasopharyngeal swab specimens and should not be used as a sole basis for treatment. Nasal washings and aspirates are unacceptable for Xpert Xpress SARS-CoV-2/FLU/RSV testing.  Fact Sheet for Patients: EntrepreneurPulse.com.au  Fact Sheet for Healthcare Providers: IncredibleEmployment.be  This test is not yet approved or cleared by the [place] FDA and has been authorized for detection and/or diagnosis of SARS-CoV-2 by FDA under an Emergency Use Authorization (EUA). This EUA will remain in effect (meaning this test can be used) for the duration of the COVID-19 declaration under Section 564(b)(1) of the Act, 21 U.S.C. section 360bbb-3(b)(1), unless the authorization is terminated or revoked.  Performed at [facility], [address] [place], [place] [address]   Urine Culture     Status: Abnormal   Collection Time: 01/21/21  9:45 AM   Specimen: Urine, Catheterized  Result Value Ref Range Status   Specimen Description URINE, CATHETERIZED  Final   Special Requests   Final    NONE Performed at [facility], [address], [place], [place] [address]    Culture >=100,000 COLONIES/mL ESCHERICHIA COLI (A)  Final   Report Status 01/24/2021 FINAL  Final   Organism ID, Bacteria ESCHERICHIA COLI (A)  Final      Susceptibility   Escherichia coli - MIC*    AMPICILLIN <=2 SENSITIVE Sensitive     CEFAZOLIN <=4 SENSITIVE Sensitive     CEFEPIME <=0.12 SENSITIVE Sensitive     CEFTRIAXONE <=0.25 SENSITIVE Sensitive     CIPROFLOXACIN <=0.25 SENSITIVE Sensitive     GENTAMICIN 4 SENSITIVE Sensitive     IMIPENEM <=0.25 SENSITIVE Sensitive     NITROFURANTOIN <=16 SENSITIVE Sensitive     TRIMETH/SULFA <=20 SENSITIVE Sensitive     AMPICILLIN/SULBACTAM <=2 SENSITIVE Sensitive     PIP/TAZO <=4 SENSITIVE Sensitive     * >=100,000  COLONIES/mL ESCHERICHIA COLI          Radiology Studies: IR GASTROSTOMY TUBE MOD SED  Result Date: 01/27/2021 INDICATION: Malnutrition EXAM: Placement of percutaneous gastrostomy tube using fluoroscopic guidance MEDICATIONS: Glucagon 1 mg IV ANESTHESIA/SEDATION: Versed 2 mg IV; Fentanyl 75 mcg IV Moderate Sedation Time:  15 The patient was continuously monitored during the procedure by the interventional radiology nurse under my direct supervision. CONTRAST:  20 mL-administered into the gastric lumen. FLUOROSCOPY TIME:  Fluoroscopy Time: 1.4 minutes with 4 exposures COMPLICATIONS: None immediate. PROCEDURE: Informed written consent was obtained from the patient after a thorough discussion of the procedural risks, benefits and alternatives. All questions were addressed. Maximal Sterile Barrier Technique was utilized including caps, mask, sterile gowns, sterile gloves, sterile drape, hand hygiene and skin antiseptic. A timeout was performed prior to the initiation of the procedure. Review of pre-procedure CT scan demonstrates an adequate window for percutaneous placement of a gastrostomy tube. The patient was placed supine on the exam table. The abdomen was prepped and draped in the standard sterile fashion. After insufflating the stomach with air via the pre-existing nasogastric tube, puncture sites were selected and local analgesia was obtained with 1% lidocaine. Using fluoroscopic guidance, a gastropexy needle was advanced into the stomach and the T-bar suture was released. Entry into the stomach was confirmed with fluoroscopy, aspiration of air, and injection of contrast material. This was repeated with an additional gastropexy suture (for a total of 2 fasteners). At the center of these gastropexy sutures, a dermatotomy was performed. An 18 gauge needle was then passed into the stomach, and position within the gastric lumen again confirmed  under fluoroscopy using aspiration of air and injection of  contrast material. An Amplatz guidewire was passed through this needle and intraluminal placement was confirmed with fluoroscopy. The needle was removed, and over the guidewire, a 20 French balloon gastrostomy tube with a coaxial 10 mm balloon was advanced into the percutaneous tract. Dilation of the percutaneous tract was then performed using the balloon, followed by advancement of the gastrostomy tube into the gastric lumen. The retention balloon was then inflated with 20 mL of sterile water, and the tube was brought back to the gastric wall. The wire and balloon were removed. The external bumper was brought to the skin. Location of the gastrostomy tube within the stomach was then confirmed with injection of contrast material, opacifying the gastric lumen. The gastrostomy tube was flushed with sterile water, and secured to the skin using a dressing. The patient tolerated the procedure well without immediate complication, and was transferred to recovery in stable condition. IMPRESSION: Successful placement of a percutaneous 20 French balloon gastrostomy tube using fluoroscopic guidance. Refer to instructions in the EMR for use. Electronically Signed   By: Albin Felling M.D.   On: 01/27/2021 13:01        Scheduled Meds:  arformoterol  15 mcg Nebulization BID   budesonide (PULMICORT) nebulizer solution  0.5 mg Nebulization BID   Chlorhexidine Gluconate Cloth  6 each Topical Daily   cycloSPORINE  1 drop Both Eyes BID   enoxaparin (LOVENOX) injection  30 mg Subcutaneous Q24H   estradiol  2 mg Oral Daily   fluticasone  2 spray Each Nare Daily   free water  100 mL Per Tube Q4H   gabapentin  800 mg Oral QID   guaiFENesin  600 mg Oral BID   hydrOXYzine  50 mg Oral QHS   mirtazapine  15 mg Oral QHS   neomycin-polymyxin-hydrocortisone  3 drop Both EARS Q8H   nicotine  21 mg Transdermal Daily   oxybutynin  15 mg Oral Daily   pantoprazole (PROTONIX) IV  40 mg Intravenous Q12H   QUEtiapine  400 mg Oral  QHS   sodium chloride flush  3 mL Intravenous Q12H   vortioxetine HBr  10 mg Oral Daily   Continuous Infusions:  feeding supplement (OSMOLITE 1.2 CAL) Stopped (01/27/21 0427)   promethazine (PHENERGAN) injection (IM or IVPB) 12.5 mg (01/26/21 1613)          Aline August, MD [facility] 01/28/2021, 7:08 AM

## 2021-01-29 DIAGNOSIS — M797 Fibromyalgia: Secondary | ICD-10-CM | POA: Diagnosis not present

## 2021-01-29 DIAGNOSIS — N3000 Acute cystitis without hematuria: Secondary | ICD-10-CM | POA: Diagnosis not present

## 2021-01-29 DIAGNOSIS — Z8719 Personal history of other diseases of the digestive system: Secondary | ICD-10-CM | POA: Diagnosis not present

## 2021-01-29 DIAGNOSIS — R1310 Dysphagia, unspecified: Secondary | ICD-10-CM | POA: Diagnosis not present

## 2021-01-29 LAB — LACTIC ACID, PLASMA: Lactic Acid, Venous: 0.8 mmol/L (ref 0.5–1.9)

## 2021-01-29 LAB — CBC WITH DIFFERENTIAL/PLATELET
Abs Immature Granulocytes: 0.02 10*3/uL (ref 0.00–0.07)
Basophils Absolute: 0 10*3/uL (ref 0.0–0.1)
Basophils Relative: 1 %
Eosinophils Absolute: 0.1 10*3/uL (ref 0.0–0.5)
Eosinophils Relative: 2 %
HCT: 29.1 % — ABNORMAL LOW (ref 36.0–46.0)
Hemoglobin: 9.8 g/dL — ABNORMAL LOW (ref 12.0–15.0)
Immature Granulocytes: 0 %
Lymphocytes Relative: 29 %
Lymphs Abs: 2.1 10*3/uL (ref 0.7–4.0)
MCH: 32.1 pg (ref 26.0–34.0)
MCHC: 33.7 g/dL (ref 30.0–36.0)
MCV: 95.4 fL (ref 80.0–100.0)
Monocytes Absolute: 0.7 10*3/uL (ref 0.1–1.0)
Monocytes Relative: 10 %
Neutro Abs: 4.3 10*3/uL (ref 1.7–7.7)
Neutrophils Relative %: 58 %
Platelets: 197 10*3/uL (ref 150–400)
RBC: 3.05 MIL/uL — ABNORMAL LOW (ref 3.87–5.11)
RDW: 11.9 % (ref 11.5–15.5)
WBC: 7.3 10*3/uL (ref 4.0–10.5)
nRBC: 0 % (ref 0.0–0.2)

## 2021-01-29 LAB — COMPREHENSIVE METABOLIC PANEL
ALT: 13 U/L (ref 0–44)
AST: 18 U/L (ref 15–41)
Albumin: 2.8 g/dL — ABNORMAL LOW (ref 3.5–5.0)
Alkaline Phosphatase: 55 U/L (ref 38–126)
Anion gap: 6 (ref 5–15)
BUN: 10 mg/dL (ref 6–20)
CO2: 27 mmol/L (ref 22–32)
Calcium: 8.5 mg/dL — ABNORMAL LOW (ref 8.9–10.3)
Chloride: 102 mmol/L (ref 98–111)
Creatinine, Ser: 0.69 mg/dL (ref 0.44–1.00)
GFR, Estimated: >60 mL/min (ref >60)
Glucose, Bld: 158 mg/dL — ABNORMAL HIGH (ref 70–99)
Potassium: 3.7 mmol/L (ref 3.5–5.1)
Sodium: 135 mmol/L (ref 135–145)
Total Bilirubin: 0.4 mg/dL (ref 0.3–1.2)
Total Protein: 5.4 g/dL — ABNORMAL LOW (ref 6.5–8.1)

## 2021-01-29 LAB — GLUCOSE, CAPILLARY
Glucose-Capillary: 166 mg/dL — ABNORMAL HIGH (ref 70–99)
Glucose-Capillary: 90 mg/dL (ref 70–99)
Glucose-Capillary: 209 mg/dL — ABNORMAL HIGH (ref 70–99)
Glucose-Capillary: 120 mg/dL — ABNORMAL HIGH (ref 70–99)
Glucose-Capillary: 90 mg/dL (ref 70–99)

## 2021-01-29 LAB — MAGNESIUM: Magnesium: 1.7 mg/dL (ref 1.7–2.4)

## 2021-01-29 MED ORDER — VANCOMYCIN HCL 750 MG/150ML IV SOLN
750.0000 mg | INTRAVENOUS | Status: DC
  Administered 2021-01-30: 750 mg via INTRAVENOUS
  Filled 2021-01-29: qty 150

## 2021-01-29 MED ORDER — LACTATED RINGERS IV BOLUS
500.0000 mL | Freq: Once | INTRAVENOUS | Status: AC
  Administered 2021-01-29: 500 mL via INTRAVENOUS

## 2021-01-29 MED ORDER — LACTATED RINGERS IV SOLN: INTRAVENOUS | Status: DC

## 2021-01-29 MED ORDER — LACTATED RINGERS IV SOLN: INTRAVENOUS | Status: AC

## 2021-01-29 MED ORDER — POTASSIUM CHLORIDE CRYS ER 20 MEQ PO TBCR
40.0000 meq | EXTENDED_RELEASE_TABLET | Freq: Once | ORAL | Status: AC
  Administered 2021-01-29: 40 meq via ORAL
  Filled 2021-01-29: qty 2

## 2021-01-29 NOTE — Progress Notes (Signed)
Pharmacy Antibiotic Note  [name] is a 45 y.o. female admitted on 01/20/2021 with sepsis of unknown source.  Pharmacy has been consulted for Vancomycin/Cefepime dosing.  Recent treatment of Ecoli UTI with CTX>>Augmentin Acute decompensation with unknown source. Patient was started on cefepime 2g q8h, metronidazole 500 mg q12h, and vancomycin 000mg  LD x1 followed by vancomycin 500mg  q12hr (eAUC 505). Based on TBW and Scr, dose will be adjusted:  IV Vancomycin 750 mg Q 24 hr Scr used: 0.8 mg/dL Weight: 40 kg Vd coeff: 0.72 L/kg Est AUC: 506.3  Plan: Continue cefepime and metronidazole Change to vancomycin 750 mg q24h Plan to obtain levels as needed Will monitor for acute changes in renal function and adjust as needed F/u cultures results and de-escalate as appropriate   Height: 5\' 4"  (162.6 cm) Weight: 40 kg (88 lb 2.9 oz) IBW/kg (Calculated) : 54.7  Temp (24hrs), Avg:99.3 F (37.4 C), Min:98 F (36.7 C), Max:100.8 F (38.2 C)  Recent Labs  Lab 01/25/21 0339 01/26/21 0359 01/27/21 0352 01/28/21 0709 01/28/21 2211 01/29/21 0139  WBC 6.4 5.2 6.1 8.6 7.8 7.3  CREATININE 0.72 0.70 0.75  --  0.75 0.69  LATICACIDVEN  --   --   --   --  1.4 0.8     Estimated Creatinine Clearance: 56.7 mL/min (by C-G formula based on SCr of 0.69 mg/dL).    Allergies  Allergen Reactions   Abilify [Aripiprazole] Swelling, Palpitations and Other (See Comments)    Throat swelling, tremors    Amitriptyline Anaphylaxis    Angioedema   Metoclopramide Hcl Other (See Comments)    Causes seizures   Propoxyphene Rash   Toradol [Ketorolac Tromethamine]    Tramadol Swelling, Other (See Comments) and Rash    Throat swelling, tremors   Ambien [Zolpidem Tartrate] Other (See Comments)    hallucinations   Eszopiclone Other (See Comments)    Hallucinations, hyperactivity, and bad taste in mouth    Varenicline Other (See Comments)    Suicidal thoughts   Buprenorphine Hcl Itching and Hives    Demerol [Meperidine] Rash   Emetrol Hives, Itching and Rash   Morphine And Related Hives and Itching    Patient states she is able to tolerate Dilaudid.    Antimicrobials this admission: Ceftriaxone 10/14 >> 10/19 Augmentin 10/19 >> 10/20 Vancomycin 10/21>> Cefepime 10/21 >> Metronidazole 10/21 >>  Microbiology results: 10/21 BCx: IP 10/14 Ucx: pan sensitive E. coli  Thank you for allowing pharmacy to be a part of this patient's care.  Zenaida Deed, PharmD PGY1 Acute Care Pharmacy Resident  Phone: [phone] 01/29/2021  1:02 PM  Please check [email or web address] for unit-specific pharmacy phone numbers.

## 2021-01-29 NOTE — Progress Notes (Signed)
Patient ID: [name], female   DOB: 10/22/1975, 45 y.o.   MRN: [id number]  PROGRESS NOTE    [name]  KWI:[id number] DOB: October 17, 1975 DOA: 01/20/2021 PCP: Neale Burly, MD   Brief Narrative:  45 y.o. female with medical history significant of HTN, HLD, chronic interstitial cystitis, COPD, pancreatitis, bipolar disorder, tobacco abuse, anxiety, and depression presented with worsening abdominal pain, nausea and vomiting.  Of note she is scheduled to undergo cystoscopy with hydrodistention/Botox injection procedure with Dr. Amalia Hailey of urology on 01/28/2021.  On presentation, CT scan of the abdomen and pelvis was negative for acute intra-abdominal/intrapelvic abnormality.  NG tube was placed for nutrition.  GI was consulted.  GI recommended PEG tube placement and signed off.  She underwent PEG tube placement on 01/27/2021 by IR. 10/22-over night when needed for fever and tachycardia.  G-tube was placed by IR on 10/20.  Blood cultures obtained.  Was treated with fluids.  Assessment & Plan:   Nausea and vomiting/dysphagia Severe malnutrition -Currently has NG tube for nutrition.  GI and SLP following. -Patient is complaining of ear pain after NG tube placement. -She has lost a lot of weight over the last 2 years and it is unclear if her oral intake will improve with NG feeding alone.   -MBS was negative for any oropharyngeal dysphagia.  GI recommended no need for barium swallow or repeat EGD and recommended to proceed directly to PEG tube placement by IR.  -status post PEG tube placement by IR on 01/27/2021 10/22 complaining of achiness all over.  Pain at the PEG tube site. No complaints of the above symptoms  New Fever/hypotension- on 10/21 pm Bcx pending Bp on low side, will start LR with bolus to be given first Cxr atelactasis  E. coli urinary tract infection: Present on admission History of chronic interstitial cystitis -Urine culture growing E. coli.  Continue amoxicillin to finish  total of 7-day course of antibiotics -Patient is supposed to undergo cystoscopy with hydrodistention/Botox injection procedure with Dr. Amalia Hailey of urology on 01/28/2021.  This will have to be rescheduled.   10/22 outpatient follow-up with urology    History of chronic pancreatitis CT abdomen unremarkable GI following signed off Outpatient follow-up with GI   Hypoglycemia -Improved.  Start tube feeding once able.  Bipolar disorder -PTA meds: Klonopin 1 tablet 3 times daily, mirtazapine 15 mg nightly, Trintellix 10 mg daily, and Seroquel 400 mg nightly Followed by Dr. Erling Cruz of psychiatry at [facility]. Per review of records she was last seen on 11/24/2020 with a note verifying mirtazapine and Klonopin, Trintellix 10 mg daily.   Continue Klonopin, mirtazapine,Trintellix, and Seroquel -anxiety appears to be playing a role with her symptoms  Chronic pain/fibromyalgia -Outpatient follow-up with pain management.  Continue as needed Dilaudid/oxycodone.  Tobacco abuse -Counseled regarding cessation by prior hospitalist.    DVT prophylaxis: Lovenox Code Status: Full Family Communication: None at bedside Disposition Plan: Status is: Inpatient  Remains inpatient appropriate because: Requiring hospitalization for severity of illness. Fever last night.  Hypotensive this a.m. Consultants: GI/IR  Procedures: PEG tube placement by IR on 01/27/2021  Antimicrobials:  Anti-infectives (From admission, onward)    Start     Dose/Rate Route Frequency Ordered Stop   01/29/21 1000  vancomycin (VANCOREADY) IVPB 500 mg/100 mL        500 mg 100 mL/hr over 60 Minutes Intravenous Every 12 hours 01/28/21 2123 02/05/21 0959   01/28/21 2215  vancomycin (VANCOCIN) IVPB 1000 mg/200 mL premix  1,000 mg 200 mL/hr over 60 Minutes Intravenous  Once 01/28/21 2115 01/29/21 0009   01/28/21 2200  ceFEPIme (MAXIPIME) 2 g in sodium chloride 0.9 % 100 mL IVPB        2 g 200 mL/hr over 30 Minutes Intravenous Every 8  hours 01/28/21 2115     01/28/21 2200  metroNIDAZOLE (FLAGYL) IVPB 500 mg        500 mg 100 mL/hr over 60 Minutes Intravenous Every 12 hours 01/28/21 2115 02/04/21 2159   01/27/21 2200  amoxicillin (AMOXIL) capsule 500 mg        500 mg Oral Every 8 hours 01/27/21 1405 01/27/21 2213   01/27/21 1154  ceFAZolin (ANCEF) 2-4 GM/100ML-% IVPB       Note to Pharmacy: Domenick Bookbinder   : cabinet override      01/27/21 1154 01/27/21 2359   01/26/21 0600  amoxicillin (AMOXIL) 250 MG/5ML suspension 500 mg  Status:  Discontinued        500 mg Per Tube Every 8 hours 01/25/21 1229 01/25/21 1724   01/26/21 0600  amoxicillin (AMOXIL) 250 MG/5ML suspension 500 mg  Status:  Discontinued        500 mg Per Tube Every 8 hours 01/25/21 1717 01/27/21 1405   01/21/21 1030  cefTRIAXone (ROCEPHIN) 1 g in sodium chloride 0.9 % 100 mL IVPB  Status:  Discontinued        1 g 200 mL/hr over 30 Minutes Intravenous Every 24 hours 01/21/21 0936 01/25/21 1229         Subjective: Denies sob, or cp.  Reports abdominal pain/soreness at the PEG tube site.  Objective: Vitals:   01/29/21 0101 01/29/21 0201 01/29/21 0301 01/29/21 0334  BP: (!) 90/57 (!) 89/63 90/66 (!) 87/69  Pulse: (!) 115 (!) 110 (!) 105 99  Resp:      Temp:    98 F (36.7 C)  TempSrc:    Oral  SpO2: 96% 97% 97% 100%  Weight:    40 kg  Height:        Intake/Output Summary (Last 24 hours) at 01/29/2021 0810 Last data filed at 01/29/2021 0441 Gross per 24 hour  Intake 514.08 ml  Output 1950 ml  Net -1435.92 ml   Filed Weights   01/24/21 0500 01/25/21 0425 01/29/21 0334  Weight: 39 kg 39.3 kg 40 kg    Examination:  Nad, calm Cta no w/r Regular S1-S2 no gallops Soft benign positive bowel sounds No edema Grossly intact  Data Reviewed: I have personally reviewed following labs and imaging studies  CBC: Recent Labs  Lab 01/26/21 0359 01/27/21 0352 01/28/21 0709 01/28/21 2211 01/29/21 0139  WBC 5.2 6.1 8.6 7.8 7.3  NEUTROABS 1.8  2.2 5.3 5.1 4.3  HGB 11.9* 12.7 12.2 11.7* 9.8*  HCT 35.5* 38.8 36.0 34.8* 29.1*  MCV 95.2 96.3 94.5 95.9 95.4  PLT 313 302 280 237 160   Basic Metabolic Panel: Recent Labs  Lab 01/23/21 0403 01/24/21 0407 01/25/21 0339 01/26/21 0359 01/27/21 0352 01/28/21 2211 01/29/21 0139  NA 141   < > 139 136 137 130* 135  K 3.9   < > 4.1 4.0 4.6 3.1* 3.7  CL 100   < > 103 100 99 97* 102  CO2 33*   < > 29 27 30 24 27   GLUCOSE 113*   < > 90 129* 92 168* 158*  BUN 15   < > 7 8 11 10 10   CREATININE 0.57   < >  0.72 0.70 0.75 0.75 0.69  CALCIUM 9.0   < > 9.6 9.2 9.6 8.7* 8.5*  MG 2.1  --   --   --  2.1  --  1.7  PHOS 4.4  --   --   --   --   --   --    < > = values in this interval not displayed.   GFR: Estimated Creatinine Clearance: 56.7 mL/min (by C-G formula based on SCr of 0.69 mg/dL). Liver Function Tests: Recent Labs  Lab 01/25/21 0339 01/26/21 0359 01/27/21 0352 01/28/21 2211 01/29/21 0139  AST 34 15 17 23 18   ALT 12 12 13 17 13   ALKPHOS 63 66 66 61 55  BILITOT 0.3 0.3 0.3 0.4 0.4  PROT 6.3* 6.4* 6.8 6.7 5.4*  ALBUMIN 3.5 3.6 3.7 3.4* 2.8*   Recent Labs  Lab 01/23/21 0403  LIPASE 24   No results for input(s): AMMONIA in the last 168 hours. Coagulation Profile: Recent Labs  Lab 01/27/21 0352 01/28/21 2211  INR 0.9 1.0   Cardiac Enzymes: No results for input(s): CKTOTAL, CKMB, CKMBINDEX, TROPONINI in the last 168 hours. BNP (last 3 results) No results for input(s): PROBNP in the last 8760 hours. HbA1C: No results for input(s): HGBA1C in the last 72 hours. CBG: Recent Labs  Lab 01/28/21 1226 01/28/21 1656 01/28/21 2108 01/29/21 0107 01/29/21 0558  GLUCAP 113* 149* 170* 166* 90   Lipid Profile: No results for input(s): CHOL, HDL, LDLCALC, TRIG, CHOLHDL, LDLDIRECT in the last 72 hours. Thyroid Function Tests: No results for input(s): TSH, T4TOTAL, FREET4, T3FREE, THYROIDAB in the last 72 hours. Anemia Panel: No results for input(s): VITAMINB12, FOLATE,  FERRITIN, TIBC, IRON, RETICCTPCT in the last 72 hours. Sepsis Labs: Recent Labs  Lab 01/28/21 2211 01/29/21 0139  LATICACIDVEN 1.4 0.8    Recent Results (from the past 240 hour(s))  Resp Panel by RT-PCR (Flu A&B, Covid) Nasopharyngeal Swab     Status: None   Collection Time: 01/21/21  4:31 AM   Specimen: Nasopharyngeal Swab; Nasopharyngeal(NP) swabs in vial transport medium  Result Value Ref Range Status   SARS Coronavirus 2 by RT PCR NEGATIVE NEGATIVE Final    Comment: (NOTE) SARS-CoV-2 target nucleic acids are NOT DETECTED.  The SARS-CoV-2 RNA is generally detectable in upper respiratory specimens during the acute phase of infection. The lowest concentration of SARS-CoV-2 viral copies this assay can detect is 138 copies/mL. A negative result does not preclude SARS-Cov-2 infection and should not be used as the sole basis for treatment or other patient management decisions. A negative result may occur with  improper specimen collection/handling, submission of specimen other than nasopharyngeal swab, presence of viral mutation(s) within the areas targeted by this assay, and inadequate number of viral copies(<138 copies/mL). A negative result must be combined with clinical observations, patient history, and epidemiological information. The expected result is Negative.  Fact Sheet for Patients:  EntrepreneurPulse.com.au  Fact Sheet for Healthcare Providers:  IncredibleEmployment.be  This test is no t yet approved or cleared by the [place] FDA and  has been authorized for detection and/or diagnosis of SARS-CoV-2 by FDA under an Emergency Use Authorization (EUA). This EUA will remain  in effect (meaning this test can be used) for the duration of the COVID-19 declaration under Section 564(b)(1) of the Act, 21 U.S.C.section 360bbb-3(b)(1), unless the authorization is terminated  or revoked sooner.       Influenza A by PCR NEGATIVE  NEGATIVE Final   Influenza B by  PCR NEGATIVE NEGATIVE Final    Comment: (NOTE) The Xpert Xpress SARS-CoV-2/FLU/RSV plus assay is intended as an aid in the diagnosis of influenza from Nasopharyngeal swab specimens and should not be used as a sole basis for treatment. Nasal washings and aspirates are unacceptable for Xpert Xpress SARS-CoV-2/FLU/RSV testing.  Fact Sheet for Patients: EntrepreneurPulse.com.au  Fact Sheet for Healthcare Providers: IncredibleEmployment.be  This test is not yet approved or cleared by the [place] FDA and has been authorized for detection and/or diagnosis of SARS-CoV-2 by FDA under an Emergency Use Authorization (EUA). This EUA will remain in effect (meaning this test can be used) for the duration of the COVID-19 declaration under Section 564(b)(1) of the Act, 21 U.S.C. section 360bbb-3(b)(1), unless the authorization is terminated or revoked.  Performed at [facility], [address] [place], [place] [address]   Urine Culture     Status: Abnormal   Collection Time: 01/21/21  9:45 AM   Specimen: Urine, Catheterized  Result Value Ref Range Status   Specimen Description URINE, CATHETERIZED  Final   Special Requests   Final    NONE Performed at [facility], [address] [place], [place] [address]    Culture >=100,000 COLONIES/mL ESCHERICHIA COLI (A)  Final   Report Status 01/24/2021 FINAL  Final   Organism ID, Bacteria ESCHERICHIA COLI (A)  Final      Susceptibility   Escherichia coli - MIC*    AMPICILLIN <=2 SENSITIVE Sensitive     CEFAZOLIN <=4 SENSITIVE Sensitive     CEFEPIME <=0.12 SENSITIVE Sensitive     CEFTRIAXONE <=0.25 SENSITIVE Sensitive     CIPROFLOXACIN <=0.25 SENSITIVE Sensitive     GENTAMICIN 4 SENSITIVE Sensitive     IMIPENEM <=0.25 SENSITIVE Sensitive     NITROFURANTOIN <=16 SENSITIVE Sensitive     TRIMETH/SULFA <=20 SENSITIVE Sensitive     AMPICILLIN/SULBACTAM <=2  SENSITIVE Sensitive     PIP/TAZO <=4 SENSITIVE Sensitive     * >=100,000 COLONIES/mL ESCHERICHIA COLI          Radiology Studies: IR GASTROSTOMY TUBE MOD SED  Result Date: 01/27/2021 INDICATION: Malnutrition EXAM: Placement of percutaneous gastrostomy tube using fluoroscopic guidance MEDICATIONS: Glucagon 1 mg IV ANESTHESIA/SEDATION: Versed 2 mg IV; Fentanyl 75 mcg IV Moderate Sedation Time:  15 The patient was continuously monitored during the procedure by the interventional radiology nurse under my direct supervision. CONTRAST:  20 mL-administered into the gastric lumen. FLUOROSCOPY TIME:  Fluoroscopy Time: 1.4 minutes with 4 exposures COMPLICATIONS: None immediate. PROCEDURE: Informed written consent was obtained from the patient after a thorough discussion of the procedural risks, benefits and alternatives. All questions were addressed. Maximal Sterile Barrier Technique was utilized including caps, mask, sterile gowns, sterile gloves, sterile drape, hand hygiene and skin antiseptic. A timeout was performed prior to the initiation of the procedure. Review of pre-procedure CT scan demonstrates an adequate window for percutaneous placement of a gastrostomy tube. The patient was placed supine on the exam table. The abdomen was prepped and draped in the standard sterile fashion. After insufflating the stomach with air via the pre-existing nasogastric tube, puncture sites were selected and local analgesia was obtained with 1% lidocaine. Using fluoroscopic guidance, a gastropexy needle was advanced into the stomach and the T-bar suture was released. Entry into the stomach was confirmed with fluoroscopy, aspiration of air, and injection of contrast material. This was repeated with an additional gastropexy suture (for a total of 2 fasteners). At the center of these  gastropexy sutures, a dermatotomy was performed. An 18 gauge needle was then passed into the stomach, and position within the gastric lumen  again confirmed under fluoroscopy using aspiration of air and injection of contrast material. An Amplatz guidewire was passed through this needle and intraluminal placement was confirmed with fluoroscopy. The needle was removed, and over the guidewire, a 20 French balloon gastrostomy tube with a coaxial 10 mm balloon was advanced into the percutaneous tract. Dilation of the percutaneous tract was then performed using the balloon, followed by advancement of the gastrostomy tube into the gastric lumen. The retention balloon was then inflated with 20 mL of sterile water, and the tube was brought back to the gastric wall. The wire and balloon were removed. The external bumper was brought to the skin. Location of the gastrostomy tube within the stomach was then confirmed with injection of contrast material, opacifying the gastric lumen. The gastrostomy tube was flushed with sterile water, and secured to the skin using a dressing. The patient tolerated the procedure well without immediate complication, and was transferred to recovery in stable condition. IMPRESSION: Successful placement of a percutaneous 20 French balloon gastrostomy tube using fluoroscopic guidance. Refer to instructions in the EMR for use. Electronically Signed   By: Albin Felling M.D.   On: 01/27/2021 13:01   DG CHEST PORT 1 VIEW  Result Date: 01/28/2021 CLINICAL DATA:  Dyspnea.  Abdominal pain. EXAM: PORTABLE CHEST 1 VIEW COMPARISON:  01/03/2021 FINDINGS: The cardiomediastinal silhouette is unchanged with normal heart size. Lung volumes are lower than on the prior study, and there are streaky opacities in both lung bases. No pleural effusion or pneumothorax is identified. The upper abdomen is evaluated on separate abdominal radiographs. No acute osseous abnormality is seen. IMPRESSION: Low lung volumes with streaky bibasilar opacities, likely atelectasis although infection is not excluded. Electronically Signed   By: Logan Bores M.D.   On:  01/28/2021 11:48   DG Abd 2 Views  Result Date: 01/28/2021 CLINICAL DATA:  Abdominal pain. EXAM: ABDOMEN - 2 VIEW COMPARISON:  01/24/2021 FINDINGS: Small volume pneumoperitoneum is seen under both diaphragms. There has been interval placement of a percutaneous gastrostomy tube which projects over the gastric body. The prior enteric tube has been removed. Oral contrast material is noted in the colon. No dilated loops of bowel are seen to suggest obstruction. Cholecystectomy clips are noted. IMPRESSION: Small volume pneumoperitoneum which is likely postprocedural from recent gastrostomy tube placement. No evidence of bowel obstruction. Electronically Signed   By: Logan Bores M.D.   On: 01/28/2021 11:46        Scheduled Meds:  arformoterol  15 mcg Nebulization BID   budesonide (PULMICORT) nebulizer solution  0.5 mg Nebulization BID   Chlorhexidine Gluconate Cloth  6 each Topical Daily   cycloSPORINE  1 drop Both Eyes BID   enoxaparin (LOVENOX) injection  30 mg Subcutaneous Q24H   estradiol  2 mg Oral Daily   feeding supplement (OSMOLITE 1.2 CAL)  237 mL Per Tube 6 X Daily   fluticasone  2 spray Each Nare Daily   free water  100 mL Per Tube 6 X Daily   gabapentin  800 mg Oral QID   guaiFENesin  600 mg Oral BID   hydrOXYzine  50 mg Oral QHS   mirtazapine  15 mg Oral QHS   neomycin-polymyxin-hydrocortisone  3 drop Both EARS Q8H   nicotine  21 mg Transdermal Daily   oxybutynin  15 mg Oral Daily   pantoprazole (PROTONIX)  IV  40 mg Intravenous Q12H   QUEtiapine  400 mg Oral QHS   sodium chloride flush  3 mL Intravenous Q12H   vortioxetine HBr  10 mg Oral Daily   Continuous Infusions:  sodium chloride 10 mL/hr at 01/28/21 2250   sodium chloride 10 mL/hr at 01/28/21 2257   ceFEPime (MAXIPIME) IV 2 g (01/29/21 0526)   lactated ringers 999 mL/hr at 01/28/21 2305   metronidazole Stopped (01/28/21 2349)   promethazine (PHENERGAN) injection (IM or IVPB) 12.5 mg (01/26/21 1613)   vancomycin         Time spent 35 minutes with more than 50% on Martin, MD [facility] 01/29/2021, 8:10 AM  [phone]

## 2021-01-30 DIAGNOSIS — F319 Bipolar disorder, unspecified: Secondary | ICD-10-CM | POA: Diagnosis not present

## 2021-01-30 DIAGNOSIS — R1313 Dysphagia, pharyngeal phase: Secondary | ICD-10-CM

## 2021-01-30 DIAGNOSIS — N3010 Interstitial cystitis (chronic) without hematuria: Secondary | ICD-10-CM | POA: Diagnosis not present

## 2021-01-30 DIAGNOSIS — N3000 Acute cystitis without hematuria: Secondary | ICD-10-CM | POA: Diagnosis not present

## 2021-01-30 LAB — GLUCOSE, CAPILLARY
Glucose-Capillary: 115 mg/dL — ABNORMAL HIGH (ref 70–99)
Glucose-Capillary: 137 mg/dL — ABNORMAL HIGH (ref 70–99)
Glucose-Capillary: 138 mg/dL — ABNORMAL HIGH (ref 70–99)
Glucose-Capillary: 147 mg/dL — ABNORMAL HIGH (ref 70–99)
Glucose-Capillary: 149 mg/dL — ABNORMAL HIGH (ref 70–99)
Glucose-Capillary: 201 mg/dL — ABNORMAL HIGH (ref 70–99)
Glucose-Capillary: 85 mg/dL (ref 70–99)

## 2021-01-30 LAB — CBC WITH DIFFERENTIAL/PLATELET
Abs Immature Granulocytes: 0.02 10*3/uL (ref 0.00–0.07)
Basophils Absolute: 0 10*3/uL (ref 0.0–0.1)
Basophils Relative: 1 %
Eosinophils Absolute: 0.3 10*3/uL (ref 0.0–0.5)
Eosinophils Relative: 5 %
HCT: 27.5 % — ABNORMAL LOW (ref 36.0–46.0)
Hemoglobin: 9.3 g/dL — ABNORMAL LOW (ref 12.0–15.0)
Immature Granulocytes: 0 %
Lymphocytes Relative: 28 %
Lymphs Abs: 1.8 10*3/uL (ref 0.7–4.0)
MCH: 32.1 pg (ref 26.0–34.0)
MCHC: 33.8 g/dL (ref 30.0–36.0)
MCV: 94.8 fL (ref 80.0–100.0)
Monocytes Absolute: 0.7 10*3/uL (ref 0.1–1.0)
Monocytes Relative: 11 %
Neutro Abs: 3.5 10*3/uL (ref 1.7–7.7)
Neutrophils Relative %: 55 %
Platelets: 208 10*3/uL (ref 150–400)
RBC: 2.9 MIL/uL — ABNORMAL LOW (ref 3.87–5.11)
RDW: 11.9 % (ref 11.5–15.5)
WBC: 6.4 10*3/uL (ref 4.0–10.5)
nRBC: 0 % (ref 0.0–0.2)

## 2021-01-30 MED ORDER — AMOXICILLIN-POT CLAVULANATE 875-125 MG PO TABS
1.0000 | ORAL_TABLET | Freq: Two times a day (BID) | ORAL | Status: DC
  Administered 2021-01-30 – 2021-02-01 (×4): 1 via ORAL
  Filled 2021-01-30 (×4): qty 1

## 2021-01-30 NOTE — Progress Notes (Signed)
PROGRESS NOTE    [name]  QQI:[id number]  DOB: [birth year]  PCP: Neale Burly, MD Admit date:01/20/2021 Chief compliant: abdominal pain with nausea/vomiting/dysphagia 45 y.o. female with HTN, HLD, chronic interstitial cystitis, COPD, pancreatitis, bipolar disorder, tobacco abuse, anxiety/depression presented with c/o worsening abdominal pain bladder spasms, dysuria, nausea and vomiting.   ED Course: Afebrile, RR 20-24, blood pressure 89/67-120 7/88,  potassium 3.1, and lipase 53.  Urinalysis was abn. CT scan of the abdomen and pelvis was negative for acute intra-abdominal/intrapelvic abnormality except chronic L5 compression # Hospital course: Patient admitted to [facility] with supportive rx, Rocephin for UTI. Suspect symptoms could be coming from possible UTI and/or related to withdrawals from her chronic opioids use.NG tube was placed for severe malnutrition.  GI was consulted.  She has lost a lot of weight over the last 2 years and it was unclear if her oral intake will improve with NG feeding alone. MBS was negative for any oropharyngeal dysphagia.  GI recommended no need for barium swallow or repeat EGD and recommended to proceed directly to PEG tube placement. 10/20:pt is s/p PEG tube placement by IR  10/21 PM-had fever and tachycardia. Blood cultures obtained.  Was treated with fluids.Cxr atelactasis. Been on abx for E coli UTI-amoxicillin. Of note patient has h/o interstitial cystitis: she is scheduled to undergo cystoscopy with hydrodistention/Botox injection procedure with Dr. Amalia Hailey of urology on 10/21--now postponed. 10/22: c/o aches all over. Has h/o fibromyalgia- on PRN Dilaudid/oxycodone   Subjective:   Patient appeared somewhat drowsy on entering the room.  She is complaining of persistent pain along PEG site insertion and chronic lower abdominal pain/bladder spasms.  She states she just received pain medication and is okay for now.  Discussed implications of opiate dependence and  withdrawal with patient.  She states she has been in rehab in the past and does not think she takes enough to withdraw.  Reports taking hydrocodone 5 mg tablets 3 times a day at baseline.  Receiving PEG feeds  Objective: Vitals:   01/30/21 0030 01/30/21 0331 01/30/21 0808 01/30/21 0810  BP: (!) 103/59 95/62    Pulse: (!) 110 98    Resp: 18 20    Temp:  98 F (36.7 C)    TempSrc:  Oral    SpO2:  100% 97% 97%  Weight:  42.9 kg    Height:        Intake/Output Summary (Last 24 hours) at 01/30/2021 0831 Last data filed at 01/30/2021 0744 Gross per 24 hour  Intake 1641.09 ml  Output 700 ml  Net 941.09 ml   Filed Weights   01/25/21 0425 01/29/21 0334 01/30/21 0331  Weight: 39.3 kg 40 kg 42.9 kg    Physical Examination:  General: Moderately built, no acute distress noted Head ENT: Atraumatic normocephalic, PERRLA, neck supple Heart: S1-S2 heard, regular rate and rhythm, no murmurs.  No leg edema noted Lungs: Equal air entry bilaterally, no rhonchi or rales on exam, no accessory muscle use Abdomen: S/p PEG.  Bowel sounds heard, soft, nontender, nondistended. No organomegaly.  No CVA tenderness Extremities: No pedal edema.  No cyanosis or clubbing. Neurological: Awake alert oriented x3, no focal weakness or numbness, strength and sensations to crude touch intact Skin: Multiple skin tattoos along the extremities, abdomen.  PEG site appears clean with no drainage or signs of infection/bleeding.    Data Reviewed: I have personally reviewed following labs and imaging studies  CBC: Recent Labs  Lab 01/27/21 0352 01/28/21 0709 01/28/21  2211 01/29/21 0139 01/30/21 0305  WBC 6.1 8.6 7.8 7.3 6.4  NEUTROABS 2.2 5.3 5.1 4.3 3.5  HGB 12.7 12.2 11.7* 9.8* 9.3*  HCT 38.8 36.0 34.8* 29.1* 27.5*  MCV 96.3 94.5 95.9 95.4 94.8  PLT 302 280 237 197 147   Basic Metabolic Panel: Recent Labs  Lab 01/25/21 0339 01/26/21 0359 01/27/21 0352 01/28/21 2211 01/29/21 0139  NA 139 136 137  130* 135  K 4.1 4.0 4.6 3.1* 3.7  CL 103 100 99 97* 102  CO2 29 27 30 24 27   GLUCOSE 90 129* 92 168* 158*  BUN 7 8 11 10 10   CREATININE 0.72 0.70 0.75 0.75 0.69  CALCIUM 9.6 9.2 9.6 8.7* 8.5*  MG  --   --  2.1  --  1.7   GFR: Estimated Creatinine Clearance: 60.8 mL/min (by C-G formula based on SCr of 0.69 mg/dL). Liver Function Tests: Recent Labs  Lab 01/25/21 0339 01/26/21 0359 01/27/21 0352 01/28/21 2211 01/29/21 0139  AST 34 15 17 23 18   ALT 12 12 13 17 13   ALKPHOS 63 66 66 61 55  BILITOT 0.3 0.3 0.3 0.4 0.4  PROT 6.3* 6.4* 6.8 6.7 5.4*  ALBUMIN 3.5 3.6 3.7 3.4* 2.8*   No results for input(s): LIPASE, AMYLASE in the last 168 hours. No results for input(s): AMMONIA in the last 168 hours. Coagulation Profile: Recent Labs  Lab 01/27/21 0352 01/28/21 2211  INR 0.9 1.0   Cardiac Enzymes: No results for input(s): CKTOTAL, CKMB, CKMBINDEX, TROPONINI in the last 168 hours. BNP (last 3 results) No results for input(s): PROBNP in the last 8760 hours. HbA1C: No results for input(s): HGBA1C in the last 72 hours. CBG: Recent Labs  Lab 01/29/21 1714 01/29/21 2151 01/30/21 0042 01/30/21 0347 01/30/21 0730  GLUCAP 120* 90 149* 137* 115*   Lipid Profile: No results for input(s): CHOL, HDL, LDLCALC, TRIG, CHOLHDL, LDLDIRECT in the last 72 hours. Thyroid Function Tests: No results for input(s): TSH, T4TOTAL, FREET4, T3FREE, THYROIDAB in the last 72 hours. Anemia Panel: No results for input(s): VITAMINB12, FOLATE, FERRITIN, TIBC, IRON, RETICCTPCT in the last 72 hours. Sepsis Labs: Recent Labs  Lab 01/28/21 2211 01/29/21 0139  LATICACIDVEN 1.4 0.8    Recent Results (from the past 240 hour(s))  Resp Panel by RT-PCR (Flu A&B, Covid) Nasopharyngeal Swab     Status: None   Collection Time: 01/21/21  4:31 AM   Specimen: Nasopharyngeal Swab; Nasopharyngeal(NP) swabs in vial transport medium  Result Value Ref Range Status   SARS Coronavirus 2 by RT PCR NEGATIVE NEGATIVE  Final    Comment: (NOTE) SARS-CoV-2 target nucleic acids are NOT DETECTED.  The SARS-CoV-2 RNA is generally detectable in upper respiratory specimens during the acute phase of infection. The lowest concentration of SARS-CoV-2 viral copies this assay can detect is 138 copies/mL. A negative result does not preclude SARS-Cov-2 infection and should not be used as the sole basis for treatment or other patient management decisions. A negative result may occur with  improper specimen collection/handling, submission of specimen other than nasopharyngeal swab, presence of viral mutation(s) within the areas targeted by this assay, and inadequate number of viral copies(<138 copies/mL). A negative result must be combined with clinical observations, patient history, and epidemiological information. The expected result is Negative.  Fact Sheet for Patients:  EntrepreneurPulse.com.au  Fact Sheet for Healthcare Providers:  IncredibleEmployment.be  This test is no t yet approved or cleared by the [place] and  has been authorized for  detection and/or diagnosis of SARS-CoV-2 by FDA under an Emergency Use Authorization (EUA). This EUA will remain  in effect (meaning this test can be used) for the duration of the COVID-19 declaration under Section 564(b)(1) of the Act, 21 U.S.C.section 360bbb-3(b)(1), unless the authorization is terminated  or revoked sooner.       Influenza A by PCR NEGATIVE NEGATIVE Final   Influenza B by PCR NEGATIVE NEGATIVE Final    Comment: (NOTE) The Xpert Xpress SARS-CoV-2/FLU/RSV plus assay is intended as an aid in the diagnosis of influenza from Nasopharyngeal swab specimens and should not be used as a sole basis for treatment. Nasal washings and aspirates are unacceptable for Xpert Xpress SARS-CoV-2/FLU/RSV testing.  Fact Sheet for Patients: EntrepreneurPulse.com.au  Fact Sheet for Healthcare  Providers: IncredibleEmployment.be  This test is not yet approved or cleared by the [place] FDA and has been authorized for detection and/or diagnosis of SARS-CoV-2 by FDA under an Emergency Use Authorization (EUA). This EUA will remain in effect (meaning this test can be used) for the duration of the COVID-19 declaration under Section 564(b)(1) of the Act, 21 U.S.C. section 360bbb-3(b)(1), unless the authorization is terminated or revoked.  Performed at [facility], [address] [place], [place] [address]   Urine Culture     Status: Abnormal   Collection Time: 01/21/21  9:45 AM   Specimen: Urine, Catheterized  Result Value Ref Range Status   Specimen Description URINE, CATHETERIZED  Final   Special Requests   Final    NONE Performed at [facility], [address] [place], [place] [address]    Culture >=100,000 COLONIES/mL ESCHERICHIA COLI (A)  Final   Report Status 01/24/2021 FINAL  Final   Organism ID, Bacteria ESCHERICHIA COLI (A)  Final      Susceptibility   Escherichia coli - MIC*    AMPICILLIN <=2 SENSITIVE Sensitive     CEFAZOLIN <=4 SENSITIVE Sensitive     CEFEPIME <=0.12 SENSITIVE Sensitive     CEFTRIAXONE <=0.25 SENSITIVE Sensitive     CIPROFLOXACIN <=0.25 SENSITIVE Sensitive     GENTAMICIN 4 SENSITIVE Sensitive     IMIPENEM <=0.25 SENSITIVE Sensitive     NITROFURANTOIN <=16 SENSITIVE Sensitive     TRIMETH/SULFA <=20 SENSITIVE Sensitive     AMPICILLIN/SULBACTAM <=2 SENSITIVE Sensitive     PIP/TAZO <=4 SENSITIVE Sensitive     * >=100,000 COLONIES/mL ESCHERICHIA COLI  Culture, blood (x 2)     Status: None (Preliminary result)   Collection Time: 01/28/21 10:11 PM   Specimen: BLOOD  Result Value Ref Range Status   Specimen Description BLOOD LEFT ANTECUBITAL  Final   Special Requests   Final    BOTTLES DRAWN AEROBIC AND ANAEROBIC Blood Culture adequate volume   Culture   Final    NO GROWTH < 12 HOURS Performed at  [facility], [address], [place], [place] [address]    Report Status PENDING  Incomplete  Culture, blood (x 2)     Status: None (Preliminary result)   Collection Time: 01/28/21 10:11 PM   Specimen: BLOOD LEFT HAND  Result Value Ref Range Status   Specimen Description BLOOD LEFT HAND  Final   Special Requests AEROBIC BOTTLE ONLY Blood Culture adequate volume  Final   Culture   Final    NO GROWTH < 12 HOURS Performed at [facility], [place] [address] [place], [place] [address]    Report Status PENDING  Incomplete  Radiology Studies: DG CHEST PORT 1 VIEW  Result Date: 01/28/2021 CLINICAL DATA:  Dyspnea.  Abdominal pain. EXAM: PORTABLE CHEST 1 VIEW COMPARISON:  01/03/2021 FINDINGS: The cardiomediastinal silhouette is unchanged with normal heart size. Lung volumes are lower than on the prior study, and there are streaky opacities in both lung bases. No pleural effusion or pneumothorax is identified. The upper abdomen is evaluated on separate abdominal radiographs. No acute osseous abnormality is seen. IMPRESSION: Low lung volumes with streaky bibasilar opacities, likely atelectasis although infection is not excluded. Electronically Signed   By: Logan Bores M.D.   On: 01/28/2021 11:48   DG Abd 2 Views  Result Date: 01/28/2021 CLINICAL DATA:  Abdominal pain. EXAM: ABDOMEN - 2 VIEW COMPARISON:  01/24/2021 FINDINGS: Small volume pneumoperitoneum is seen under both diaphragms. There has been interval placement of a percutaneous gastrostomy tube which projects over the gastric body. The prior enteric tube has been removed. Oral contrast material is noted in the colon. No dilated loops of bowel are seen to suggest obstruction. Cholecystectomy clips are noted. IMPRESSION: Small volume pneumoperitoneum which is likely postprocedural from recent gastrostomy tube placement. No evidence of bowel obstruction. Electronically Signed   By: Logan Bores M.D.   On: 01/28/2021 11:46       Scheduled Meds:  arformoterol  15 mcg Nebulization BID   budesonide (PULMICORT) nebulizer solution  0.5 mg Nebulization BID   Chlorhexidine Gluconate Cloth  6 each Topical Daily   cycloSPORINE  1 drop Both Eyes BID   enoxaparin (LOVENOX) injection  30 mg Subcutaneous Q24H   estradiol  2 mg Oral Daily   feeding supplement (OSMOLITE 1.2 CAL)  237 mL Per Tube 6 X Daily   fluticasone  2 spray Each Nare Daily   free water  100 mL Per Tube 6 X Daily   gabapentin  800 mg Oral QID   guaiFENesin  600 mg Oral BID   hydrOXYzine  50 mg Oral QHS   mirtazapine  15 mg Oral QHS   nicotine  21 mg Transdermal Daily   oxybutynin  15 mg Oral Daily   pantoprazole (PROTONIX) IV  40 mg Intravenous Q12H   QUEtiapine  400 mg Oral QHS   sodium chloride flush  3 mL Intravenous Q12H   vortioxetine HBr  10 mg Oral Daily   Continuous Infusions:  sodium chloride 10 mL/hr at 01/28/21 2250   sodium chloride 10 mL/hr at 01/28/21 2257   ceFEPime (MAXIPIME) IV 2 g (01/30/21 0653)   lactated ringers 999 mL/hr at 01/28/21 2305   lactated ringers 100 mL/hr at 01/30/21 0448   metronidazole 500 mg (01/30/21 0008)   promethazine (PHENERGAN) injection (IM or IVPB) 12.5 mg (01/26/21 1613)   vancomycin 750 mg (01/30/21 0525)     Assessment/Plan:  #1.  Abdominal pain, nausea, vomiting: Felt secondary to UTI and patient treated with antibiotics since admission.  Culture from 10/14 growing pansensitive E. coli.  Given tachycardia, borderline hypotension (SBP 90s to low 100s) and ?fever on the night of 10/21-MEWS score turned Red and on-call MD evaluated patient, blood cultures sent and antibiotics escalated to IV cefepime/vancomycin/Flagyl.  No evidence of leukocytosis and blood cultures negative so far.  Patient reports pain along PEG tube insertion site which appears intact, without complications.  Patient does have history of fibromyalgia and chronic pain syndrome.  Discussed with nurse who reports patient requesting  pain medications around the clock, at times appearing drowsy.  She has been sleeping most of the day  today and heart rate been better according to bedside RN.  Continue supportive management and transition antibiotics back to cephalosporins.  #2.  Odynophagia, poor oral intake, severe malnutrition: Seen by speech therapy last week and recommended PEG feeds for nutritional support.  Cleared for diet to be started and advanced as tolerated once odynophagia improves.  Currently patient denies any throat pain, will prescribe Magic mouthwash and resume liquid diet-advance as tolerated   #3.  Chronic interstitial cystitis: Was scheduled to undergo cystoscopy by urology but currently postponed due to UTI. Resume home medications for chronic bladder spasms  #4.  Hypotension, tachycardia: Appears to have chronic hypotension with systolic BP fluctuating 13-244 in the setting of chronic opiate use and multiple anticholinergic meds (for bladder and psych meds).  Tachycardia might be related to anxiety versus response to hypotension.  Doubt if sepsis related given nontoxic appearance.  #5.  Chronic pain syndrome, fibromyalgia: Patient appears to be opiate dependent and does run the risk of opiate overuse/withdrawal.  Will avoid escalation of any pain medication regimen.  Discussed with patient and she understands.  #6.  Bipolar disorder: Resume home medications.  Follow psychiatry as outpatient.  #7.  Chronic pancreatitis: Seen by GI in the setting of problem #1.  Now signed off.  #8.  Tobacco use: Nicotine patch.   DVT prophylaxis: Lovenox Code Status: Full code Family / Patient Communication: Discussed with patient, bedside nurse.  No family at bedside Disposition Plan:   Status is: Inpatient   Remains inpatient appropriate because: De-escalation of antibiotics, hemodynamics monitoring-blood pressure heart rate        Time spent: 35 minutes     >50% time spent in discussions with care team and  coordination of care.    Guilford Shi, MD [facility] Pager in [place]  If 7PM-7AM, please contact night-coverage [email or web address] 01/30/2021, 8:31 AM

## 2021-01-30 NOTE — Plan of Care (Signed)
  Problem: Skin Integrity: Goal: Risk for impaired skin integrity will decrease Outcome: Completed/Met

## 2021-01-31 DIAGNOSIS — N3010 Interstitial cystitis (chronic) without hematuria: Secondary | ICD-10-CM | POA: Diagnosis not present

## 2021-01-31 DIAGNOSIS — F319 Bipolar disorder, unspecified: Secondary | ICD-10-CM | POA: Diagnosis not present

## 2021-01-31 DIAGNOSIS — N3000 Acute cystitis without hematuria: Secondary | ICD-10-CM | POA: Diagnosis not present

## 2021-01-31 LAB — CBC WITH DIFFERENTIAL/PLATELET
Abs Immature Granulocytes: 0.02 10*3/uL (ref 0.00–0.07)
Basophils Absolute: 0 10*3/uL (ref 0.0–0.1)
Basophils Relative: 1 %
Eosinophils Absolute: 0.2 10*3/uL (ref 0.0–0.5)
Eosinophils Relative: 3 %
HCT: 29 % — ABNORMAL LOW (ref 36.0–46.0)
Hemoglobin: 9.5 g/dL — ABNORMAL LOW (ref 12.0–15.0)
Immature Granulocytes: 0 %
Lymphocytes Relative: 26 %
Lymphs Abs: 2 10*3/uL (ref 0.7–4.0)
MCH: 31.7 pg (ref 26.0–34.0)
MCHC: 32.8 g/dL (ref 30.0–36.0)
MCV: 96.7 fL (ref 80.0–100.0)
Monocytes Absolute: 0.7 10*3/uL (ref 0.1–1.0)
Monocytes Relative: 8 %
Neutro Abs: 4.8 10*3/uL (ref 1.7–7.7)
Neutrophils Relative %: 62 %
Platelets: 232 10*3/uL (ref 150–400)
RBC: 3 MIL/uL — ABNORMAL LOW (ref 3.87–5.11)
RDW: 12.1 % (ref 11.5–15.5)
WBC: 7.7 10*3/uL (ref 4.0–10.5)
nRBC: 0 % (ref 0.0–0.2)

## 2021-01-31 LAB — GLUCOSE, CAPILLARY
Glucose-Capillary: 109 mg/dL — ABNORMAL HIGH (ref 70–99)
Glucose-Capillary: 121 mg/dL — ABNORMAL HIGH (ref 70–99)
Glucose-Capillary: 118 mg/dL — ABNORMAL HIGH (ref 70–99)
Glucose-Capillary: 130 mg/dL — ABNORMAL HIGH (ref 70–99)

## 2021-01-31 LAB — TSH: TSH: 4.046 u[IU]/mL (ref 0.350–4.500)

## 2021-01-31 MED ORDER — PANTOPRAZOLE SODIUM 40 MG PO TBEC
40.0000 mg | DELAYED_RELEASE_TABLET | Freq: Every day | ORAL | Status: DC
  Administered 2021-01-31 – 2021-02-01 (×2): 40 mg via ORAL
  Filled 2021-01-31 (×2): qty 1

## 2021-01-31 MED ORDER — HYDROMORPHONE HCL 1 MG/ML IJ SOLN
1.0000 mg | Freq: Three times a day (TID) | INTRAMUSCULAR | Status: DC | PRN
Start: 2021-01-31 — End: 2021-01-31

## 2021-01-31 NOTE — Progress Notes (Signed)
PROGRESS NOTE    [name]  BOF:[id number] DOB: May 12, 1975 DOA: 01/20/2021 PCP: Neale Burly, MD    Brief Narrative:  45 year old female with hypertension, hyperlipidemia, chronic interstitial cystitis, COPD, pancreatitis, bipolar disorder, smoker, anxiety and depression admitted on 10/13 with worsening abdominal pain, bladder spasms dysuria nausea and vomiting.  Patient admitted to hospital for supportive treatment, Rocephin for UTI.  10/20, PEG tube placement by IR for failure to thrive 10/21, fever and diarrhea.  Blood cultures drawn.  Treated with fluids.  Already on amoxicillin. 10/22, aching all over.  Fibromyalgia.  On pain medications with Dilaudid and oxycodone.   Assessment & Plan:   Principal Problem:   UTI (urinary tract infection) Active Problems:   SMOKER   Bipolar disorder (HCC)   BRONCHITIS, RECURRENT   Chronic interstitial cystitis   Thrombocytosis   Elevated lipase   Hypokalemia   Fibromyalgia   History of pancreatitis   Dysphagia  Abdominal pain, nausea and vomiting: Initially felt secondary to UTI. Urine culture from 10/14 with pansensitive E. Coli.  Currently on Augmentin.  Repeat cultures have been negative.  We will continue maintenance antibiotic until she goes to follow-up with urology.  Odynophagia/poor oral intake and severe protein calorie malnutrition: Currently on PEG tube feeding.  Tolerating.  Can have diet. Patient has to learn to take trach tube feeding herself.  6 times a day.  Chronic interstitial cystitis: Scheduled to undergo cystoscopy by urology but currently postponed due to UTI.  Continue pain medications, medications for bladder spasm.  Chronic pain syndrome and fibromyalgia: Patient is opiate dependence.  Continue. Try to de-escalate to home pain medication regimen today.  Discontinue IV Dilaudid.  Bipolar disorder: On home medications with Trintellix, Seroquel, hydroxyzine that she will continue.  Smoker: On nicotine  patch.   DVT prophylaxis: enoxaparin (LOVENOX) injection 30 mg Start: 01/28/21 1000   Code Status: Full code Family Communication: None Disposition Plan: Status is: Inpatient  Remains inpatient appropriate because: Still using IV pain medications for pain relief.  Not mobilized yet.   Discontinue IV fluids.  Discontinue telemetry.  Patient to learn herself to administer tube feeding.  Mobilizing the hallway.  Discharge tomorrow if she is able to take care of herself and medication administration.      Consultants:  Interventional radiology Neurology  Procedures:  PEG tube placement  Antimicrobials:  Multiple antibiotics.  Currently on Augmentin.   Subjective: Patient seen and examined.  Diffuse abdominal pain present.  Looks forward to go home.  Has not been able to mobilize because of multiple lines and cardiac monitor.  Objective: Vitals:   01/30/21 2120 01/30/21 2125 01/31/21 0235 01/31/21 0336  BP:    105/72  Pulse:    (!) 101  Resp:    19  Temp:    98.3 F (36.8 C)  TempSrc:    Oral  SpO2: 100% 98%  96%  Weight:   42.6 kg   Height:        Intake/Output Summary (Last 24 hours) at 01/31/2021 0850 Last data filed at 01/30/2021 2355 Gross per 24 hour  Intake 2534.04 ml  Output 4200 ml  Net -1665.96 ml   Filed Weights   01/29/21 0334 01/30/21 0331 01/31/21 0235  Weight: 40 kg 42.9 kg 42.6 kg    Examination:  General exam: Appears calm and comfortable  Anxious.  Chronically sick looking.  Frail and debilitated and cachectic.  Not in any distress. Respiratory system: Clear to auscultation. Respiratory effort normal.  On  2 L oxygen but saturating 100%.  No added sounds. Cardiovascular system: S1 & S2 heard, RRR. No pedal edema. Gastrointestinal system: Soft.  Mildly distended.  Tender all over without any rigidity or guarding. PEG tube in place.  Intact and dry. Central nervous system: Alert and oriented. No focal neurological deficits.    Data  Reviewed: I have personally reviewed following labs and imaging studies  CBC: Recent Labs  Lab 01/28/21 0709 01/28/21 2211 01/29/21 0139 01/30/21 0305 01/31/21 0442  WBC 8.6 7.8 7.3 6.4 7.7  NEUTROABS 5.3 5.1 4.3 3.5 4.8  HGB 12.2 11.7* 9.8* 9.3* 9.5*  HCT 36.0 34.8* 29.1* 27.5* 29.0*  MCV 94.5 95.9 95.4 94.8 96.7  PLT 280 237 197 208 616   Basic Metabolic Panel: Recent Labs  Lab 01/25/21 0339 01/26/21 0359 01/27/21 0352 01/28/21 2211 01/29/21 0139  NA 139 136 137 130* 135  K 4.1 4.0 4.6 3.1* 3.7  CL 103 100 99 97* 102  CO2 29 27 30 24 27   GLUCOSE 90 129* 92 168* 158*  BUN 7 8 11 10 10   CREATININE 0.72 0.70 0.75 0.75 0.69  CALCIUM 9.6 9.2 9.6 8.7* 8.5*  MG  --   --  2.1  --  1.7   GFR: Estimated Creatinine Clearance: 60.4 mL/min (by C-G formula based on SCr of 0.69 mg/dL). Liver Function Tests: Recent Labs  Lab 01/25/21 0339 01/26/21 0359 01/27/21 0352 01/28/21 2211 01/29/21 0139  AST 34 15 17 23 18   ALT 12 12 13 17 13   ALKPHOS 63 66 66 61 55  BILITOT 0.3 0.3 0.3 0.4 0.4  PROT 6.3* 6.4* 6.8 6.7 5.4*  ALBUMIN 3.5 3.6 3.7 3.4* 2.8*   No results for input(s): LIPASE, AMYLASE in the last 168 hours. No results for input(s): AMMONIA in the last 168 hours. Coagulation Profile: Recent Labs  Lab 01/27/21 0352 01/28/21 2211  INR 0.9 1.0   Cardiac Enzymes: No results for input(s): CKTOTAL, CKMB, CKMBINDEX, TROPONINI in the last 168 hours. BNP (last 3 results) No results for input(s): PROBNP in the last 8760 hours. HbA1C: No results for input(s): HGBA1C in the last 72 hours. CBG: Recent Labs  Lab 01/30/21 1122 01/30/21 1621 01/30/21 2003 01/30/21 2353 01/31/21 0340  GLUCAP 138* 201* 85 147* 109*   Lipid Profile: No results for input(s): CHOL, HDL, LDLCALC, TRIG, CHOLHDL, LDLDIRECT in the last 72 hours. Thyroid Function Tests: Recent Labs    01/31/21 0442  TSH 4.046   Anemia Panel: No results for input(s): VITAMINB12, FOLATE, FERRITIN, TIBC,  IRON, RETICCTPCT in the last 72 hours. Sepsis Labs: Recent Labs  Lab 01/28/21 2211 01/29/21 0139  LATICACIDVEN 1.4 0.8    Recent Results (from the past 240 hour(s))  Urine Culture     Status: Abnormal   Collection Time: 01/21/21  9:45 AM   Specimen: Urine, Catheterized  Result Value Ref Range Status   Specimen Description URINE, CATHETERIZED  Final   Special Requests   Final    NONE Performed at [facility], [address], [place], [place] [address]    Culture >=100,000 COLONIES/mL ESCHERICHIA COLI (A)  Final   Report Status 01/24/2021 FINAL  Final   Organism ID, Bacteria ESCHERICHIA COLI (A)  Final      Susceptibility   Escherichia coli - MIC*    AMPICILLIN <=2 SENSITIVE Sensitive     CEFAZOLIN <=4 SENSITIVE Sensitive     CEFEPIME <=0.12 SENSITIVE Sensitive     CEFTRIAXONE <=0.25 SENSITIVE Sensitive  CIPROFLOXACIN <=0.25 SENSITIVE Sensitive     GENTAMICIN 4 SENSITIVE Sensitive     IMIPENEM <=0.25 SENSITIVE Sensitive     NITROFURANTOIN <=16 SENSITIVE Sensitive     TRIMETH/SULFA <=20 SENSITIVE Sensitive     AMPICILLIN/SULBACTAM <=2 SENSITIVE Sensitive     PIP/TAZO <=4 SENSITIVE Sensitive     * >=100,000 COLONIES/mL ESCHERICHIA COLI  Culture, blood (x 2)     Status: None (Preliminary result)   Collection Time: 01/28/21 10:11 PM   Specimen: BLOOD  Result Value Ref Range Status   Specimen Description BLOOD LEFT ANTECUBITAL  Final   Special Requests   Final    BOTTLES DRAWN AEROBIC AND ANAEROBIC Blood Culture adequate volume   Culture   Final    NO GROWTH 3 DAYS Performed at [facility], [address] [place], [place] [address]    Report Status PENDING  Incomplete  Culture, blood (x 2)     Status: None (Preliminary result)   Collection Time: 01/28/21 10:11 PM   Specimen: BLOOD LEFT HAND  Result Value Ref Range Status   Specimen Description BLOOD LEFT HAND  Final   Special Requests AEROBIC BOTTLE ONLY Blood Culture adequate volume  Final   Culture    Final    NO GROWTH 3 DAYS Performed at [facility], [place] [address] [place],  [address]    Report Status PENDING  Incomplete         Radiology Studies: No results found.      Scheduled Meds:  amoxicillin-clavulanate  1 tablet Oral Q12H   arformoterol  15 mcg Nebulization BID   budesonide (PULMICORT) nebulizer solution  0.5 mg Nebulization BID   Chlorhexidine Gluconate Cloth  6 each Topical Daily   cycloSPORINE  1 drop Both Eyes BID   enoxaparin (LOVENOX) injection  30 mg Subcutaneous Q24H   estradiol  2 mg Oral Daily   feeding supplement (OSMOLITE 1.2 CAL)  237 mL Per Tube 6 X Daily   fluticasone  2 spray Each Nare Daily   free water  100 mL Per Tube 6 X Daily   gabapentin  800 mg Oral QID   guaiFENesin  600 mg Oral BID   hydrOXYzine  50 mg Oral QHS   mirtazapine  15 mg Oral QHS   nicotine  21 mg Transdermal Daily   oxybutynin  15 mg Oral Daily   pantoprazole  40 mg Oral Daily   QUEtiapine  400 mg Oral QHS   sodium chloride flush  3 mL Intravenous Q12H   vortioxetine HBr  10 mg Oral Daily   Continuous Infusions:  sodium chloride Stopped (01/28/21 2253)   sodium chloride Stopped (01/29/21 0607)   promethazine (PHENERGAN) injection (IM or IVPB) 12.5 mg (01/26/21 1613)     LOS: 10 days    Time spent: 25 minutes    Barb Merino, MD [facility] Pager [phone]

## 2021-02-01 ENCOUNTER — Other Ambulatory Visit (HOSPITAL_COMMUNITY): Payer: Self-pay

## 2021-02-01 DIAGNOSIS — N3010 Interstitial cystitis (chronic) without hematuria: Secondary | ICD-10-CM | POA: Diagnosis not present

## 2021-02-01 DIAGNOSIS — R1312 Dysphagia, oropharyngeal phase: Secondary | ICD-10-CM | POA: Diagnosis not present

## 2021-02-01 DIAGNOSIS — F319 Bipolar disorder, unspecified: Secondary | ICD-10-CM | POA: Diagnosis not present

## 2021-02-01 DIAGNOSIS — N3000 Acute cystitis without hematuria: Secondary | ICD-10-CM | POA: Diagnosis not present

## 2021-02-01 LAB — BASIC METABOLIC PANEL
Anion gap: 8 (ref 5–15)
BUN: 6 mg/dL (ref 6–20)
CO2: 27 mmol/L (ref 22–32)
Calcium: 9.5 mg/dL (ref 8.9–10.3)
Chloride: 100 mmol/L (ref 98–111)
Creatinine, Ser: 0.57 mg/dL (ref 0.44–1.00)
GFR, Estimated: >60 mL/min (ref >60)
Glucose, Bld: 106 mg/dL — ABNORMAL HIGH (ref 70–99)
Potassium: 4 mmol/L (ref 3.5–5.1)
Sodium: 135 mmol/L (ref 135–145)

## 2021-02-01 LAB — CBC WITH DIFFERENTIAL/PLATELET
Abs Immature Granulocytes: 0.04 10*3/uL (ref 0.00–0.07)
Basophils Absolute: 0.1 10*3/uL (ref 0.0–0.1)
Basophils Relative: 1 %
Eosinophils Absolute: 0.3 10*3/uL (ref 0.0–0.5)
Eosinophils Relative: 4 %
HCT: 34.9 % — ABNORMAL LOW (ref 36.0–46.0)
Hemoglobin: 11.3 g/dL — ABNORMAL LOW (ref 12.0–15.0)
Immature Granulocytes: 1 %
Lymphocytes Relative: 34 %
Lymphs Abs: 2.5 10*3/uL (ref 0.7–4.0)
MCH: 30.9 pg (ref 26.0–34.0)
MCHC: 32.4 g/dL (ref 30.0–36.0)
MCV: 95.4 fL (ref 80.0–100.0)
Monocytes Absolute: 0.5 10*3/uL (ref 0.1–1.0)
Monocytes Relative: 7 %
Neutro Abs: 4.1 10*3/uL (ref 1.7–7.7)
Neutrophils Relative %: 53 %
Platelets: 280 10*3/uL (ref 150–400)
RBC: 3.66 MIL/uL — ABNORMAL LOW (ref 3.87–5.11)
RDW: 12.1 % (ref 11.5–15.5)
WBC: 7.5 10*3/uL (ref 4.0–10.5)
nRBC: 0 % (ref 0.0–0.2)

## 2021-02-01 LAB — MAGNESIUM: Magnesium: 1.9 mg/dL (ref 1.7–2.4)

## 2021-02-01 LAB — GLUCOSE, CAPILLARY
Glucose-Capillary: 127 mg/dL — ABNORMAL HIGH (ref 70–99)
Glucose-Capillary: 112 mg/dL — ABNORMAL HIGH (ref 70–99)
Glucose-Capillary: 102 mg/dL — ABNORMAL HIGH (ref 70–99)

## 2021-02-01 LAB — PHOSPHORUS: Phosphorus: 4.5 mg/dL (ref 2.5–4.6)

## 2021-02-01 MED ORDER — OSMOLITE 1.2 CAL PO LIQD
237.0000 mL | Freq: Every day | ORAL | 0 refills | Status: AC
  Filled 2021-02-01: qty 4266, 3d supply, fill #0

## 2021-02-01 MED ORDER — GABAPENTIN 800 MG PO TABS: 400.0000 mg | ORAL_TABLET | Freq: Four times a day (QID) | ORAL | Status: AC

## 2021-02-01 MED ORDER — HYDROCODONE-ACETAMINOPHEN 5-325 MG PO TABS
1.0000 | ORAL_TABLET | Freq: Three times a day (TID) | ORAL | 0 refills | Status: DC | PRN
  Filled 2021-02-01: qty 6, 2d supply, fill #0

## 2021-02-01 MED ORDER — HYDROCODONE-ACETAMINOPHEN 5-325 MG PO TABS: 1.0000 | ORAL_TABLET | Freq: Three times a day (TID) | ORAL | 0 refills | Status: DC | PRN

## 2021-02-01 MED ORDER — OSMOLITE 1.2 CAL PO LIQD: 237.0000 mL | Freq: Every day | ORAL | 0 refills | Status: DC

## 2021-02-01 NOTE — TOC Initial Note (Addendum)
Transition of Care [facility]) - Initial/Assessment Note    Patient Details  Name: [name] MRN: [id number] Date of Birth: 05/01/1975  Transition of Care [facility]) CM/SW Contact:    Zenon Mayo, RN Phone Number: 02/01/2021, 8:42 AM  Clinical Narrative:                 NCM spoke with patient at bedside, she will be going home with new bolus tube feeds.  She is ok with [employer] supplying the tube feeds for her.  NCM made referral to [facility] with [facility] for the tube feeds supplies.  NCM informed the Staff RN that she will need to send patient home with 3 days work of supplies for tube feeds because it takes 3 days for [facility] to get the feeds there. Patient states she would like a HHRN just to come and check on her to make sure everything is going ok with the tube feeds.  NCM offered choice, she states Alvis Lemmings is ok,  NCM made referral to [place] with [place].  Awaiting for call back. Per Tommi Rumps , he does not have the staff in [place] office.  NCM made referral to [facility] with [facility], awaiting to hear back. Kenzie with [facility] states she can take referral for [facility] Soc will begin 24 to 48 hrs post dc.   Expected Discharge Plan: [place] Barriers to Discharge: No Barriers Identified   Patient Goals and CMS Choice Patient states their goals for this hospitalization and ongoing recovery are:: return home CMS Medicare.gov Compare Post Acute Care list provided to:: Patient Choice offered to / list presented to : Patient  Expected Discharge Plan and Services Expected Discharge Plan: [place]   Discharge Planning Services: CM Consult   Living arrangements for the past 2 months: Single Family Home Expected Discharge Date: 02/01/21               DME Arranged: Francesco Runner feeding DME Agency: [employer] Date DME Agency Contacted: 02/01/21 Time DME Agency Contacted: [phone] Representative spoke with at DME Agency: Thedore Mins            Prior Living Arrangements/Services Living  arrangements for the past 2 months: [place] Lives with:: Adult Children, Spouse Patient language and need for interpreter reviewed:: Yes Do you feel safe going back to the place where you live?: Yes      Need for Family Participation in Patient Care: Yes (Comment) Care giver support system in place?: Yes (comment)   Criminal Activity/Legal Involvement Pertinent to Current Situation/Hospitalization: No - Comment as needed  Activities of Daily Living Home Assistive Devices/Equipment: Crutches ADL Screening (condition at time of admission) Patient's cognitive ability adequate to safely complete daily activities?: Yes Is the patient deaf or have difficulty hearing?: No Does the patient have difficulty seeing, even when wearing glasses/contacts?: No Does the patient have difficulty concentrating, remembering, or making decisions?: No Patient able to express need for assistance with ADLs?: Yes Does the patient have difficulty dressing or bathing?: No Independently performs ADLs?: Yes (appropriate for developmental age) Does the patient have difficulty walking or climbing stairs?: Yes Weakness of Legs: Both Weakness of Arms/Hands: None  Permission Sought/Granted                  Emotional Assessment Appearance:: Appears stated age Attitude/Demeanor/Rapport: Engaged Affect (typically observed): Appropriate Orientation: : Oriented to Self, Oriented to Place, Oriented to  Time, Oriented to Situation Alcohol / Substance Use: Not Applicable Psych  Involvement: No (comment)  Admission diagnosis:  UTI (urinary tract infection) [N39.0] Intractable vomiting with nausea [R11.2] Patient Active Problem List   Diagnosis Date Noted   Dysphagia    UTI (urinary tract infection) 01/21/2021   Thrombocytosis 01/21/2021   Elevated lipase 01/21/2021   Hypokalemia 01/21/2021   Fibromyalgia 01/21/2021   History of pancreatitis 01/21/2021   Nausea without vomiting 05/24/2020   Abdominal  pain 05/24/2020   Severe protein-calorie malnutrition ([place]) 05/24/2020   Encounter by telehealth for suspected COVID-19 03/20/2019   Abdominal pain, epigastric 10/07/2018   Intractable vomiting 08/09/2018   Compression fracture of fifth lumbar vertebra ([place]) 02/07/2018   Altered mental status 02/07/2018   Generalized weakness 01/24/2018   MDD (major depressive disorder) 04/23/2017   Substance abuse ([place]) 04/19/2017   Hyperthyroidism 02/17/2015   Anxiety 01/28/2015   Clinical depression 01/28/2015   COPD with acute exacerbation ([place]) 11/26/2013   Vaginitis and vulvovaginitis 11/26/2013   Recurrent pneumonia 02/02/2013   Left arm pain 01/13/2013   Left arm pain 01/13/2013    Class: Acute   Dizziness and giddiness 01/13/2013   Acute bronchitis 12/07/2012   Rib pain 12/07/2012   Insomnia 08/26/2012   Diarrhea 06/19/2012   Heme positive stool 06/19/2012   Bloating 06/19/2012   Nausea alone 06/19/2012   Bladder retention 04/24/2012   Tremor 03/05/2012   Dysuria 03/05/2012   Failure to thrive in adult 06/21/2011   Airway hyperreactivity 03/08/2011   Back pain, chronic 03/08/2011   Chronic obstructive pulmonary disease ([place]) 03/08/2011   Acid reflux 03/08/2011   Cystitis 01/22/2011   Bilateral hand numbness 01/13/2011   WEIGHT LOSS 06/24/2010   RIB PAIN, LEFT SIDED 05/04/2010   ABDOMINAL PAIN, LEFT UPPER QUADRANT 05/04/2010   Unspecified otitis media 10/26/2009   BRONCHITIS, RECURRENT 08/23/2009   URI, ACUTE 08/04/2009   FATIGUE 07/06/2009   IBS 03/11/2009   ANAL FISSURE 03/11/2009   RECTAL BLEEDING 03/11/2009   Chronic interstitial cystitis 03/11/2009   COLONIC POLYPS, HX OF 03/11/2009   GERD 02/05/2009   SMOKER 12/04/2008   chronic asthma poorly controlled 12/04/2008   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 [place] [address] Phone: [phone] Fax: [phone]     Social Determinants of Health (SDOH) Interventions    Readmission Risk Interventions Readmission Risk Prevention Plan 02/01/2021  Transportation Screening Complete  Medication Review ([place]) Complete  PCP or Specialist appointment within 3-5 days of discharge Complete  HRI or [place] Complete  SW Recovery Care/Counseling Consult Complete  Palliative Care Screening Not [place] Not Applicable  Some recent data might be hidden

## 2021-02-01 NOTE — TOC Transition Note (Addendum)
Transition of Care [facility]) - CM/SW Discharge Note   Patient Details  Name: [name] MRN: [id number] Date of Birth: May 05, 1975  Transition [facility] Care [facility]) CM/SW Contact:  Zenon Mayo, RN Phone Number: 02/01/2021, 10:15 AM   Clinical Narrative:    NCM spoke with patient at bedside, she will be going home with new bolus tube feeds.  She is ok with [employer] supplying the tube feeds for her.  NCM made referral to [facility] with [employer] for the tube feeds supplies.  NCM informed the Staff RN that she will need to send patient home with 3 days work of supplies for tube feeds because it takes 3 days for [employer] to get the feeds there. Patient states she would like a HHRN just to come and check on her to make sure everything is going ok with the tube feeds.  She also wants HHPT.  NCM offered choice, she states Alvis Lemmings is ok,  NCM made referral to [place] with [place].  Awaiting for call back. Per Tommi Rumps , he does not have the staff in [place] office.  NCM made referral to [facility] with [facility], awaiting to hear back. Kenzie with [facility] states she can take referral for [facility], will add HHPT as well.  Soc will begin 24 to 48 hrs post dc.  Pharmacy is trying to get the tube feeding for patient to go home with 3 days worth of feeding , because It will take 3 days for the order with [employer] to come in.    Final next level of care: [place] Barriers to Discharge: No Barriers Identified   Patient Goals and CMS Choice Patient states their goals for this hospitalization and ongoing recovery are:: return home CMS Medicare.gov Compare Post Acute Care list provided to:: Patient Choice offered to / list presented to : Patient  Discharge Placement                       Discharge Plan and Services   Discharge Planning Services: CM Consult            DME Arranged: Tube feeding, walker ,rolling DME Agency: [employer] Date DME Agency Contacted: 02/01/21 Time DME Agency Contacted: [phone] Representative  spoke with at DME Agency: [place]: RN,PT [place] Agency: [place] ([place]) Date [place]: 02/01/21 Time [place]: [place] Representative spoke with at [place]: [place] ([place]) Interventions     Readmission Risk Interventions Readmission Risk Prevention Plan 02/01/2021  Transportation Screening Complete  Medication Review [place]) Complete  PCP or Specialist appointment within 3-5 days of discharge Complete  HRI or [place] Complete  SW Recovery Care/Counseling Consult Complete  [place] Not Applicable  Some recent data might be hidden

## 2021-02-01 NOTE — Discharge Summary (Signed)
Physician Discharge Summary  [name] ZSM:[id number] DOB: 10-22-1975 DOA: 01/20/2021  PCP: Neale Burly, MD  Admit date: 01/20/2021 Discharge date: 02/01/2021  Admitted From: Home Disposition: Home with home health RN  Recommendations for Outpatient Follow-up:  Follow up with PCP in 1-2 weeks Please obtain BMP/CBC in one week Follow-up with urology as scheduled  Home Health: RN Equipment/Devices: None  Discharge Condition: Stable CODE STATUS: Full code Diet recommendation: Regular diet.  Tube feeding.    Discharge summary:  45 year old female with hypertension, hyperlipidemia, chronic interstitial cystitis, COPD, pancreatitis, bipolar disorder, smoker, anxiety and depression admitted on 10/13 with worsening abdominal pain, bladder spasms dysuria nausea and vomiting.  Patient admitted to hospital for supportive treatment, Rocephin for UTI.  10/20, PEG tube placement by IR for failure to thrive 10/21, fever and diarrhea.  Blood cultures drawn.  Treated with fluids.  Already on amoxicillin. 10/22, aching all over.  Fibromyalgia.  On pain medications with Dilaudid and oxycodone. 10/25, asymptomatic.  All repeat cultures negative     Abdominal pain, nausea and vomiting: Initially felt secondary to UTI. Urine culture from 10/14 with pansensitive E. Coli.  Treated with Augmentin.  Completed 10 days therapy. Repeat cultures have been negative.  No indication to continue antibiotics.  Will DC on discharge.  Odynophagia/poor oral intake and severe protein calorie malnutrition: Currently on PEG tube feeding.  Tolerating.  Can have diet. Patient has to learn to take trach tube feeding herself.  6 times a day.   Chronic interstitial cystitis: Scheduled to undergo cystoscopy by urology but currently postponed due to UTI.  Continue pain medications, medications for bladder spasm.   Chronic pain syndrome and fibromyalgia: Patient is opiate dependence.  Continue. Patient is on  hydrocodone 5 mg/325 mg 3 times a day from pain management clinic at [facility].  Verified with controlled substance database.  She is also on gabapentin. Patient has missed appointment and is going to go back to the clinic tomorrow.  She will be prescribed 6 tablets of hydrocodone until she is seen in the clinic.   Bipolar disorder: On home medications with Trintellix, Seroquel, hydroxyzine that she will continue.  Patient is medically stable.  Pain is managed with oral pain medications.  Stable to discharge home.    Discharge Diagnoses:  Principal Problem:   UTI (urinary tract infection) Active Problems:   SMOKER   Bipolar disorder (HCC)   BRONCHITIS, RECURRENT   Chronic interstitial cystitis   Thrombocytosis   Elevated lipase   Hypokalemia   Fibromyalgia   History of pancreatitis   Dysphagia    Discharge Instructions  Discharge Instructions     Call MD for:  persistant nausea and vomiting   Complete by: As directed    Call MD for:  redness, tenderness, or signs of infection (pain, swelling, redness, odor or green/yellow discharge around incision site)   Complete by: As directed    Call MD for:  severe uncontrolled pain   Complete by: As directed    Diet - low sodium heart healthy   Complete by: As directed    Increase activity slowly   Complete by: As directed    No wound care   Complete by: As directed       Allergies as of 02/01/2021       Reactions   Abilify [aripiprazole] Swelling, Palpitations, Other (See Comments)   Throat swelling, tremors   Amitriptyline Anaphylaxis   Angioedema   Metoclopramide Hcl Other (See Comments)  Causes seizures   Propoxyphene Rash   Toradol [ketorolac Tromethamine]    Tramadol Swelling, Other (See Comments), Rash   Throat swelling, tremors   Ambien [zolpidem Tartrate] Other (See Comments)   hallucinations   Eszopiclone Other (See Comments)   Hallucinations, hyperactivity, and bad taste in mouth    Varenicline Other (See Comments)   Suicidal thoughts   Buprenorphine Hcl Itching, Hives   Demerol [meperidine] Rash   Emetrol Hives, Itching, Rash   Morphine And Related Hives, Itching   Patient states she is able to tolerate Dilaudid.        Medication List     STOP taking these medications    potassium chloride SA 20 MEQ tablet Commonly known as: KLOR-CON       TAKE these medications    acetaminophen 500 MG tablet Commonly known as: TYLENOL Take 1,000 mg by mouth every 6 (six) hours as needed for mild pain, fever or headache.   albuterol 108 (90 Base) MCG/ACT inhaler Commonly known as: ProAir HFA INHALE 2 PUFFS EVERY 6 HOURS AS NEEDED FOR SHORTNESS OF BREATH AND WHEEZING. What changed:  how much to take how to take this when to take this reasons to take this additional instructions   clonazePAM 0.5 MG tablet Commonly known as: KLONOPIN Take 1 tablet (0.5 mg total) by mouth 3 (three) times daily as needed for anxiety.   cyclobenzaprine 5 MG tablet Commonly known as: FLEXERIL Take 1 tablet (5 mg total) by mouth 3 (three) times daily as needed for muscle spasms.   estradiol 0.1 MG/GM vaginal cream Commonly known as: ESTRACE Place 1 Applicatorful vaginally every 3 (three) days.   estradiol 2 MG tablet Commonly known as: ESTRACE Take 2 mg by mouth daily.   feeding supplement (OSMOLITE 1.2 CAL) Liqd Place 237 mLs into feeding tube 6 (six) times daily for 3 days.   fluticasone 50 MCG/ACT nasal spray Commonly known as: FLONASE Place 2 sprays into both nostrils daily. What changed:  when to take this reasons to take this   gabapentin 800 MG tablet Commonly known as: NEURONTIN Take 0.5 tablets (400 mg total) by mouth in the morning, at noon, in the evening, and at bedtime. What changed: how much to take   HYDROcodone-acetaminophen 5-325 MG tablet Commonly known as: NORCO/VICODIN Take 1 tablet by mouth 3 (three) times daily as needed. What changed:  reasons to take this   hydrOXYzine 50 MG tablet Commonly known as: ATARAX/VISTARIL Take 50 mg by mouth at bedtime.   Hyoscyamine Sulfate SL 0.125 MG Subl Take 0.125 mg by mouth every 4 (four) hours as needed (for cramping).   mirtazapine 15 MG tablet Commonly known as: REMERON Take 15 mg by mouth at bedtime.   omeprazole 40 MG capsule Commonly known as: PRILOSEC TAKE 1 CAPSULE BY MOUTH ONCE DAILY.   ondansetron 8 MG disintegrating tablet Commonly known as: ZOFRAN-ODT Take 1 tablet (8 mg total) by mouth every 8 (eight) hours as needed for nausea or vomiting.   oxybutynin 15 MG 24 hr tablet Commonly known as: DITROPAN XL Take 15 mg by mouth daily.   promethazine 25 MG tablet Commonly known as: PHENERGAN TAKE (1) TABLET EVERY SIX HOURS AS NEEDED FOR NAUSEA AND VOMITING. What changed:  See the new instructions. Another medication with the same name was removed. Continue taking this medication, and follow the directions you see here.   QUEtiapine 400 MG tablet Commonly known as: SEROQUEL Take 400 mg by mouth at bedtime.   Restasis  0.05 % ophthalmic emulsion Generic drug: cycloSPORINE Place 1 drop into both eyes 2 (two) times daily.   Symbicort 80-4.5 MCG/ACT inhaler Generic drug: budesonide-formoterol INHALE 2 PUFFS INTO THE LUNGS TWICE DAILY. What changed: when to take this        Follow-up Information     Neale Burly, MD. Go on 02/08/2021.   Specialty: Internal Medicine Why: @1 :30pm Contact information: [address] [place] [address] [phone]         [facility] Follow up.   Why: tube feeds Contact information: [place] [address] [place] [facility] up.   Why: HHRN, they will call you with apt times.               Allergies  Allergen Reactions   Abilify [Aripiprazole] Swelling, Palpitations and Other (See Comments)    Throat swelling, tremors    Amitriptyline  Anaphylaxis    Angioedema   Metoclopramide Hcl Other (See Comments)    Causes seizures   Propoxyphene Rash   Toradol [Ketorolac Tromethamine]    Tramadol Swelling, Other (See Comments) and Rash    Throat swelling, tremors   Ambien [Zolpidem Tartrate] Other (See Comments)    hallucinations   Eszopiclone Other (See Comments)    Hallucinations, hyperactivity, and bad taste in mouth    Varenicline Other (See Comments)    Suicidal thoughts   Buprenorphine Hcl Itching and Hives   Demerol [Meperidine] Rash   Emetrol Hives, Itching and Rash   Morphine And Related Hives and Itching    Patient states she is able to tolerate Dilaudid.    Consultations: Interventional radiology, PEG tube placement    Procedures/Studies: DG Abd 1 View  Result Date: 01/24/2021 CLINICAL DATA:  NG tube placement EXAM: ABDOMEN - 1 VIEW COMPARISON:  None. FINDINGS: Enteric tube terminates in the gastric cardia. Nonobstructive bowel gas pattern. Lung bases are clear. IMPRESSION: Enteric tube terminates in the gastric cardia. Electronically Signed   By: Julian Hy M.D.   On: 01/24/2021 03:30   CT ABDOMEN PELVIS W CONTRAST  Result Date: 01/20/2021 CLINICAL DATA:  Nonlocalized acute abdominal pain. States that her bladder is not working and she needs a stimulator. She is having surgery in a week. Here today with back/abdominal pain and headache. EXAM: CT ABDOMEN AND PELVIS WITH CONTRAST TECHNIQUE: Multidetector CT imaging of the abdomen and pelvis was performed using the standard protocol following bolus administration of intravenous contrast. CONTRAST:  36mL OMNIPAQUE IOHEXOL 300 MG/ML  SOLN COMPARISON:  CT abdomen pelvis 12/19/2018 FINDINGS: Lower chest: No acute abnormality. Hepatobiliary: No focal liver abnormality. Status post cholecystectomy. Launch enlarged common bile duct with no intrahepatic biliary dilatation. Pancreas: No focal lesion. Normal pancreatic contour. No surrounding inflammatory changes.  No main pancreatic ductal dilatation. Spleen: Normal in size without focal abnormality. Adrenals/Urinary Tract: No adrenal nodule bilaterally. Bilateral kidneys enhance symmetrically. No hydronephrosis. No hydroureter. The urinary bladder is unremarkable. On delayed imaging, there is no urothelial wall thickening and there are no filling defects in the opacified portions of the bilateral collecting systems or ureters. Stomach/Bowel: Stomach is within normal limits. No evidence of bowel wall thickening or dilatation. Appendix appears normal. Vascular/Lymphatic: No abdominal aorta or iliac aneurysm. No abdominal, pelvic, or inguinal lymphadenopathy. Reproductive: Status post hysterectomy. No adnexal masses. Other: No intraperitoneal free fluid. No intraperitoneal free gas. No organized fluid collection. Musculoskeletal: No abdominal wall hernia or abnormality. No  suspicious lytic or blastic osseous lesions. No acute displaced fracture. Similar-appearing L5 compression fracture. IMPRESSION: 1. No acute intra-abdominal or intrapelvic abnormality. 2. Chronic similar-appearing L5 compression fracture. Electronically Signed   By: Iven Finn M.D.   On: 01/20/2021 22:22   IR GASTROSTOMY TUBE MOD SED  Result Date: 01/27/2021 INDICATION: Malnutrition EXAM: Placement of percutaneous gastrostomy tube using fluoroscopic guidance MEDICATIONS: Glucagon 1 mg IV ANESTHESIA/SEDATION: Versed 2 mg IV; Fentanyl 75 mcg IV Moderate Sedation Time:  15 The patient was continuously monitored during the procedure by the interventional radiology nurse under my direct supervision. CONTRAST:  20 mL-administered into the gastric lumen. FLUOROSCOPY TIME:  Fluoroscopy Time: 1.4 minutes with 4 exposures COMPLICATIONS: None immediate. PROCEDURE: Informed written consent was obtained from the patient after a thorough discussion of the procedural risks, benefits and alternatives. All questions were addressed. Maximal Sterile Barrier Technique  was utilized including caps, mask, sterile gowns, sterile gloves, sterile drape, hand hygiene and skin antiseptic. A timeout was performed prior to the initiation of the procedure. Review of pre-procedure CT scan demonstrates an adequate window for percutaneous placement of a gastrostomy tube. The patient was placed supine on the exam table. The abdomen was prepped and draped in the standard sterile fashion. After insufflating the stomach with air via the pre-existing nasogastric tube, puncture sites were selected and local analgesia was obtained with 1% lidocaine. Using fluoroscopic guidance, a gastropexy needle was advanced into the stomach and the T-bar suture was released. Entry into the stomach was confirmed with fluoroscopy, aspiration of air, and injection of contrast material. This was repeated with an additional gastropexy suture (for a total of 2 fasteners). At the center of these gastropexy sutures, a dermatotomy was performed. An 18 gauge needle was then passed into the stomach, and position within the gastric lumen again confirmed under fluoroscopy using aspiration of air and injection of contrast material. An Amplatz guidewire was passed through this needle and intraluminal placement was confirmed with fluoroscopy. The needle was removed, and over the guidewire, a 20 French balloon gastrostomy tube with a coaxial 10 mm balloon was advanced into the percutaneous tract. Dilation of the percutaneous tract was then performed using the balloon, followed by advancement of the gastrostomy tube into the gastric lumen. The retention balloon was then inflated with 20 mL of sterile water, and the tube was brought back to the gastric wall. The wire and balloon were removed. The external bumper was brought to the skin. Location of the gastrostomy tube within the stomach was then confirmed with injection of contrast material, opacifying the gastric lumen. The gastrostomy tube was flushed with sterile water, and  secured to the skin using a dressing. The patient tolerated the procedure well without immediate complication, and was transferred to recovery in stable condition. IMPRESSION: Successful placement of a percutaneous 20 French balloon gastrostomy tube using fluoroscopic guidance. Refer to instructions in the EMR for use. Electronically Signed   By: Albin Felling M.D.   On: 01/27/2021 13:01   DG CHEST PORT 1 VIEW  Result Date: 01/28/2021 CLINICAL DATA:  Dyspnea.  Abdominal pain. EXAM: PORTABLE CHEST 1 VIEW COMPARISON:  01/03/2021 FINDINGS: The cardiomediastinal silhouette is unchanged with normal heart size. Lung volumes are lower than on the prior study, and there are streaky opacities in both lung bases. No pleural effusion or pneumothorax is identified. The upper abdomen is evaluated on separate abdominal radiographs. No acute osseous abnormality is seen. IMPRESSION: Low lung volumes with streaky bibasilar opacities, likely atelectasis although infection  is not excluded. Electronically Signed   By: Logan Bores M.D.   On: 01/28/2021 11:48   DG Abd 2 Views  Result Date: 01/28/2021 CLINICAL DATA:  Abdominal pain. EXAM: ABDOMEN - 2 VIEW COMPARISON:  01/24/2021 FINDINGS: Small volume pneumoperitoneum is seen under both diaphragms. There has been interval placement of a percutaneous gastrostomy tube which projects over the gastric body. The prior enteric tube has been removed. Oral contrast material is noted in the colon. No dilated loops of bowel are seen to suggest obstruction. Cholecystectomy clips are noted. IMPRESSION: Small volume pneumoperitoneum which is likely postprocedural from recent gastrostomy tube placement. No evidence of bowel obstruction. Electronically Signed   By: Logan Bores M.D.   On: 01/28/2021 11:46   DG Naso G Tube Plc W/Fl W/Rad  Result Date: 01/22/2021 CLINICAL DATA:  NG tube placement EXAM: NASO G TUBE PLACEMENT WITH FL AND WITH RAD FLUOROSCOPY TIME:  Fluoroscopy Time:  36  seconds Radiation Exposure Index (if provided by the fluoroscopic device): 1 mGy COMPARISON:  None. FINDINGS: Multiple attempts were made to place the enteric tube in a post pyloric position. However, the tube could not be directed toward the right. Ultimately, the tube was left within the stomach directed toward the left. IMPRESSION: Fluoroscopic guided placement of enteric tube within the stomach. Post pyloric positioning could not be obtained. Electronically Signed   By: Macy Mis M.D.   On: 01/22/2021 17:25   DG Swallowing Func-Speech Pathology  Result Date: 01/25/2021 Table formatting from the original result was not included. Objective Swallowing Evaluation: Type of Study: MBS-Modified Barium Swallow Study  Patient Details Name: HENRETTER PIEKARSKI MRN: [id number] Date of Birth: [birth year] Today's Date: 01/25/2021 Time: SLP Start Time (ACUTE ONLY): 1315 -SLP Stop Time (ACUTE ONLY): 0737 SLP Time Calculation (min) (ACUTE ONLY): 32 min Past Medical History: Past Medical History: Diagnosis Date  Anal fissure 03/11/2009  Anemia   Anxiety and depression   ARTHRITIS 08/31/2007  Asthma   BENZODIAZEPINE ADDICTION 08/31/2007  Bipolar 1 disorder ([place])   Bowel obstruction (HCC)   Breakdown of urinary electronic stimulator device, init ([place])   BRONCHITIS, RECURRENT 08/23/2009  Asthmatic Bronchitis-Dr. Melvyn Novas.....-HFA 75% 12/04/2008>75% 02/05/2009>75% 08/04/2009 -PFT's 01/04/2009 2.56 (86%) ratio 75, no resp to B2 and DLC0 67% > 80 after correction   Cancer (HCC)   cervical cancer  Chronic interstitial cystitis 03/11/2009  Chronic nausea   Chronic pain   COLONIC POLYPS, HX OF 07/25/2006  ADENOMATOUS POLYP  COPD (chronic obstructive pulmonary disease) (HCC)   Endometriosis   FIBROMYALGIA 08/31/2007  GERD 02/05/2009  Hyperlipidemia   HYPERTENSION 08/31/2007  IBS 03/11/2009  Internal hemorrhoids   Migraine headache   NEPHROLITHIASIS 08/31/2007  Pancreatitis   PONV (postoperative nausea and vomiting)   RECTAL BLEEDING 03/11/2009   Sciatica   right leg  Seizures ([place])   been about 1 year since last seisure per pt  SLEEP APNEA 08/31/2007  Substance abuse ([place])   Thyroid disease   Uterine cyst  Past Surgical History: Past Surgical History: Procedure Laterality Date  ABDOMINAL HYSTERECTOMY    BIOPSY  10/25/2018  Procedure: BIOPSY;  Surgeon: Rogene Houston, MD;  Location: [facility];  Service: Endoscopy;;  gastric  BIOPSY  06/11/2020  Procedure: BIOPSY;  Surgeon: Harvel Quale, MD;  Location: [facility];  Service: Gastroenterology;;  small bowel  bladder stretching x6    BLADDER SURGERY    stimulator placed and stretching   CHOLECYSTECTOMY    COLONOSCOPY    COLONOSCOPY WITH  PROPOFOL N/A 09/03/2014  Procedure: COLONOSCOPY WITH PROPOFOL;  Surgeon: Milus Banister, MD;  Location: [facility];  Service: Endoscopy;  Laterality: N/A;  ESOPHAGOGASTRODUODENOSCOPY (EGD) WITH PROPOFOL N/A 10/25/2018  Procedure: ESOPHAGOGASTRODUODENOSCOPY (EGD) WITH PROPOFOL;  Surgeon: Rogene Houston, MD;  Location: [facility];  Service: Endoscopy;  Laterality: N/A;  11:15  ESOPHAGOGASTRODUODENOSCOPY (EGD) WITH PROPOFOL N/A 06/11/2020  Procedure: ESOPHAGOGASTRODUODENOSCOPY (EGD) WITH PROPOFOL;  Surgeon: Harvel Quale, MD;  Location: [facility];  Service: Gastroenterology;  Laterality: N/A;  interstitial cystitis    PACEMAKER INSERTION    in hip for interstitial cystitis  pacemaker removal    removal of uterine cyst and scrapped uterus    replaced bladder pacemaker   HPI: KRITIKA STUKES is a 45 y.o. female who presented with complaints abdominal pain with nausea and vomiting over the last 2 weeks. Pt has had significant weight loss.  She reports difficulty going back 2 years with worsening in past 2 months resulting in inability to eat any solid foods.  Lower lungs clear on Ab CT 10/13.  Pt has been seen by GI in the past and has had multiple normal EGDs. Most recent in [facility] documentation 06/2020 was negative  except for Candida esophagitis. Per  chart she has had multiple days without BM at times, but that this is not normal for her.  She reports hx of IBS.  Pt with medical history significant of hypertension, hyperlipidemia, chronic interstitial cystitis, COPD, pancreatitis, bipolar disorder, tobacco abuse, anxiety, and depression.  Subjective: Pt awake, alert, pleasant, participative Assessment / Plan / Recommendation CHL IP CLINICAL IMPRESSIONS 01/25/2021 Clinical Impression Pt was seen for an MBS. Overall, pt presents with functional oral and pharyngeal swallow across all consistencies. Pt reported chronic nausea and difficulty keeping food down for two years, exhibiting hesitancy to swallow barium d/t fear of emesis. SLP provided encouragement and compensatory strategies for tolerating barium (small sips, quick oral phase, etc.), and provided pt with emesis bag to elicit relaxation. Pt tolerated small sips and bites of thin liquid, puree, mechanical soft, and regular solids exhibiting no oral phase or pharyngeal phase impairments. Pt did report moderate odynophagia and globus sensation in throat after presentations of solids and SLP showed pt evidence of clear imaging exhibiting no pharyngeal residue. MBS does not diagnose below the level of the UES however scan revealed what appeared to be timely transit to stomach without residue. Pt needs no acute SLP follow up. Recommend continuing Dys1/thin liquid diet d/t pt discomfort re: swallowing solids. SLP Visit Diagnosis Dysphagia, unspecified (R13.10) Attention and concentration deficit following -- Frontal lobe and executive function deficit following -- Impact on safety and function Mild aspiration risk   CHL IP TREATMENT RECOMMENDATION 01/25/2021 Treatment Recommendations No treatment recommended at this time   Prognosis 01/23/2021 Prognosis for Safe Diet Advancement (No Data) Barriers to Reach Goals -- Barriers/Prognosis Comment -- CHL IP DIET RECOMMENDATION 01/25/2021 SLP Diet Recommendations  Dysphagia 1 (Puree) solids;Thin liquid Liquid Administration via Cup;Straw Medication Administration Crushed with puree Compensations Small sips/bites Postural Changes Remain semi-upright after after feeds/meals (Comment);Seated upright at 90 degrees   CHL IP OTHER RECOMMENDATIONS 01/25/2021 Recommended Consults -- Oral Care Recommendations Oral care BID Other Recommendations --   CHL IP FOLLOW UP RECOMMENDATIONS 01/25/2021 Follow up Recommendations None   CHL IP FREQUENCY AND DURATION 01/23/2021 Speech Therapy Frequency (ACUTE ONLY) min 2x/week Treatment Duration 2 weeks      CHL IP ORAL PHASE 01/25/2021 Oral Phase WFL Oral - Pudding Teaspoon -- Oral -  Pudding Cup -- Oral - Honey Teaspoon -- Oral - Honey Cup -- Oral - Nectar Teaspoon -- Oral - Nectar Cup -- Oral - Nectar Straw -- Oral - Thin Teaspoon -- Oral - Thin Cup -- Oral - Thin Straw -- Oral - Puree -- Oral - Mech Soft -- Oral - Regular -- Oral - Multi-Consistency -- Oral - Pill -- Oral Phase - Comment --  CHL IP PHARYNGEAL PHASE 01/25/2021 Pharyngeal Phase WFL Pharyngeal- Pudding Teaspoon -- Pharyngeal -- Pharyngeal- Pudding Cup -- Pharyngeal -- Pharyngeal- Honey Teaspoon -- Pharyngeal -- Pharyngeal- Honey Cup -- Pharyngeal -- Pharyngeal- Nectar Teaspoon -- Pharyngeal -- Pharyngeal- Nectar Cup -- Pharyngeal -- Pharyngeal- Nectar Straw -- Pharyngeal -- Pharyngeal- Thin Teaspoon -- Pharyngeal -- Pharyngeal- Thin Cup -- Pharyngeal -- Pharyngeal- Thin Straw -- Pharyngeal -- Pharyngeal- Puree -- Pharyngeal -- Pharyngeal- Mechanical Soft -- Pharyngeal -- Pharyngeal- Regular -- Pharyngeal -- Pharyngeal- Multi-consistency -- Pharyngeal -- Pharyngeal- Pill -- Pharyngeal -- Pharyngeal Comment --  CHL IP CERVICAL ESOPHAGEAL PHASE 01/25/2021 Cervical Esophageal Phase WFL Pudding Teaspoon -- Pudding Cup -- Honey Teaspoon -- Honey Cup -- Nectar Teaspoon -- Nectar Cup -- Nectar Straw -- Thin Teaspoon -- Thin Cup -- Thin Straw -- Puree -- Mechanical Soft -- Regular --  Multi-consistency -- Pill -- Cervical Esophageal Comment -- [place] Siren 01/25/2021, 4:16 PM              (Echo, Carotid, EGD, Colonoscopy, ERCP)    Subjective: Patient seen and examined.  Mild abdominal pain remains.  She learned how to do PEG tube feeding and she cannot do it herself.  Also able to take medications and liquid by mouth.  Eager to go home.   Discharge Exam: Vitals:   02/01/21 0842 02/01/21 0920  BP:  103/68  Pulse:  100  Resp:    Temp:    SpO2: 95% 96%   Vitals:   01/31/21 2140 02/01/21 0328 02/01/21 0842 02/01/21 0920  BP:  102/74  103/68  Pulse:  (!) 108  100  Resp:  17    Temp:  98.2 F (36.8 C)    TempSrc:  Oral    SpO2: 95% 96% 95% 96%  Weight:  40.5 kg    Height:        General: Pt is alert, awake, not in acute distress Frail.  Debilitated.  Cachectic.  Not in any distress.  On room air. Cardiovascular: RRR, S1/S2 +, no rubs, no gallops Respiratory: CTA bilaterally, no wheezing, no rhonchi Abdominal: Soft, mildly tender all over.  No rigidity or guarding.  PEG tube in place. Extremities: no edema, no cyanosis    The results of significant diagnostics from this hospitalization (including imaging, microbiology, ancillary and laboratory) are listed below for reference.     Microbiology: Recent Results (from the past 240 hour(s))  Culture, blood (x 2)     Status: None (Preliminary result)   Collection Time: 01/28/21 10:11 PM   Specimen: BLOOD  Result Value Ref Range Status   Specimen Description BLOOD LEFT ANTECUBITAL  Final   Special Requests   Final    BOTTLES DRAWN AEROBIC AND ANAEROBIC Blood Culture adequate volume   Culture   Final    NO GROWTH 4 DAYS Performed at [facility], [address] [place], [place] [address]    Report Status PENDING  Incomplete  Culture, blood (x 2)     Status: None (Preliminary result)   Collection Time: 01/28/21 10:11 PM   Specimen: BLOOD  LEFT HAND  Result Value Ref Range Status   Specimen  Description BLOOD LEFT HAND  Final   Special Requests AEROBIC BOTTLE ONLY Blood Culture adequate volume  Final   Culture   Final    NO GROWTH 4 DAYS Performed at [facility], [address] [place], [place] [address]    Report Status PENDING  Incomplete     Labs: BNP (last 3 results) No results for input(s): BNP in the last 8760 hours. Basic Metabolic Panel: Recent Labs  Lab 01/26/21 0359 01/27/21 0352 01/28/21 2211 01/29/21 0139 02/01/21 0341  NA 136 137 130* 135 135  K 4.0 4.6 3.1* 3.7 4.0  CL 100 99 97* 102 100  CO2 27 30 24 27 27   GLUCOSE 129* 92 168* 158* 106*  BUN 8 11 10 10 6   CREATININE 0.70 0.75 0.75 0.69 0.57  CALCIUM 9.2 9.6 8.7* 8.5* 9.5  MG  --  2.1  --  1.7 1.9  PHOS  --   --   --   --  4.5   Liver Function Tests: Recent Labs  Lab 01/26/21 0359 01/27/21 0352 01/28/21 2211 01/29/21 0139  AST 15 17 23 18   ALT 12 13 17 13   ALKPHOS 66 66 61 55  BILITOT 0.3 0.3 0.4 0.4  PROT 6.4* 6.8 6.7 5.4*  ALBUMIN 3.6 3.7 3.4* 2.8*   No results for input(s): LIPASE, AMYLASE in the last 168 hours. No results for input(s): AMMONIA in the last 168 hours. CBC: Recent Labs  Lab 01/28/21 2211 01/29/21 0139 01/30/21 0305 01/31/21 0442 02/01/21 0341  WBC 7.8 7.3 6.4 7.7 7.5  NEUTROABS 5.1 4.3 3.5 4.8 4.1  HGB 11.7* 9.8* 9.3* 9.5* 11.3*  HCT 34.8* 29.1* 27.5* 29.0* 34.9*  MCV 95.9 95.4 94.8 96.7 95.4  PLT 237 197 208 232 280   Cardiac Enzymes: No results for input(s): CKTOTAL, CKMB, CKMBINDEX, TROPONINI in the last 168 hours. BNP: Invalid input(s): POCBNP CBG: Recent Labs  Lab 01/31/21 1620 01/31/21 2030 02/01/21 0059 02/01/21 0344 02/01/21 0822  GLUCAP 118* 130* 127* 112* 102*   D-Dimer No results for input(s): DDIMER in the last 72 hours. Hgb A1c No results for input(s): HGBA1C in the last 72 hours. Lipid Profile No results for input(s): CHOL, HDL, LDLCALC, TRIG, CHOLHDL, LDLDIRECT in the last 72 hours. Thyroid function studies Recent  Labs    01/31/21 0442  TSH 4.046   Anemia work up No results for input(s): VITAMINB12, FOLATE, FERRITIN, TIBC, IRON, RETICCTPCT in the last 72 hours. Urinalysis    Component Value Date/Time   COLORURINE YELLOW 01/20/2021 1935   APPEARANCEUR CLOUDY (A) 01/20/2021 1935   LABSPEC 1.025 01/20/2021 1935   PHURINE 6.0 01/20/2021 1935   GLUCOSEU NEGATIVE 01/20/2021 1935   HGBUR NEGATIVE 01/20/2021 1935   BILIRUBINUR NEGATIVE 01/20/2021 1935   BILIRUBINUR positive 10/02/2019 1038   KETONESUR NEGATIVE 01/20/2021 1935   PROTEINUR NEGATIVE 01/20/2021 1935   UROBILINOGEN 0.2 10/02/2019 1038   UROBILINOGEN 0.2 06/23/2014 1300   NITRITE POSITIVE (A) 01/20/2021 1935   LEUKOCYTESUR SMALL (A) 01/20/2021 1935   Sepsis Labs Invalid input(s): PROCALCITONIN,  WBC,  LACTICIDVEN Microbiology Recent Results (from the past 240 hour(s))  Culture, blood (x 2)     Status: None (Preliminary result)   Collection Time: 01/28/21 10:11 PM   Specimen: BLOOD  Result Value Ref Range Status   Specimen Description BLOOD LEFT ANTECUBITAL  Final   Special Requests   Final    BOTTLES DRAWN AEROBIC  AND ANAEROBIC Blood Culture adequate volume   Culture   Final    NO GROWTH 4 DAYS Performed at [place] [address] [place], [place] [address]    Report Status PENDING  Incomplete  Culture, blood (x 2)     Status: None (Preliminary result)   Collection Time: 01/28/21 10:11 PM   Specimen: BLOOD LEFT HAND  Result Value Ref Range Status   Specimen Description BLOOD LEFT HAND  Final   Special Requests AEROBIC BOTTLE ONLY Blood Culture adequate volume  Final   Culture   Final    NO GROWTH 4 DAYS Performed at [facility], [place] [address], [place], [place] [address]    Report Status PENDING  Incomplete     Time coordinating discharge: 35 minutes  SIGNED:   Barb Merino, MD  [facility] 02/01/2021, 10:34 AM

## 2021-02-01 NOTE — Progress Notes (Signed)
Brief Nutrition Follow-Up Note  Spoke with Financial controller and RNCM. Pt will be discharging home today and [facility] will provide pt with a 3 day supply of feedings, as it will take [place] 3 days to procure supplies at her home (pt will require 18 cartons of Osmolite 1.2).   Pt home bolus feeding regimen:  237 ml (1 carton) Osmolite 1.2 6 times daily via PEG   50 ml free water flush before and after each feeding administration   Tube feeding regimen provides 1710 kcal (100% of needs), 79 grams of protein, and 1170 ml of H2O.  Total free water: 1770 ml daily  Loistine Chance, RD, LDN, [place] Registered Dietitian II Certified Diabetes Care and Education Specialist Please refer to [facility] for RD and/or RD on-call/weekend/after hours pager

## 2021-02-02 ENCOUNTER — Other Ambulatory Visit: Payer: Self-pay | Admitting: Family Medicine

## 2021-02-02 DIAGNOSIS — F17210 Nicotine dependence, cigarettes, uncomplicated: Secondary | ICD-10-CM | POA: Diagnosis not present

## 2021-02-02 DIAGNOSIS — G8929 Other chronic pain: Secondary | ICD-10-CM | POA: Diagnosis not present

## 2021-02-02 DIAGNOSIS — Z09 Encounter for follow-up examination after completed treatment for conditions other than malignant neoplasm: Secondary | ICD-10-CM | POA: Diagnosis not present

## 2021-02-02 DIAGNOSIS — M5441 Lumbago with sciatica, right side: Secondary | ICD-10-CM | POA: Diagnosis not present

## 2021-02-02 DIAGNOSIS — Z79899 Other long term (current) drug therapy: Secondary | ICD-10-CM | POA: Diagnosis not present

## 2021-02-02 DIAGNOSIS — J440 Chronic obstructive pulmonary disease with acute lower respiratory infection: Secondary | ICD-10-CM

## 2021-02-02 DIAGNOSIS — Z681 Body mass index (BMI) 19 or less, adult: Secondary | ICD-10-CM | POA: Diagnosis not present

## 2021-02-02 LAB — CULTURE, BLOOD (ROUTINE X 2)
Culture: NO GROWTH
Culture: NO GROWTH
Special Requests: ADEQUATE
Special Requests: ADEQUATE

## 2021-02-03 DIAGNOSIS — D75839 Thrombocytosis, unspecified: Secondary | ICD-10-CM | POA: Diagnosis not present

## 2021-02-03 DIAGNOSIS — M1990 Unspecified osteoarthritis, unspecified site: Secondary | ICD-10-CM | POA: Diagnosis not present

## 2021-02-03 DIAGNOSIS — Z431 Encounter for attention to gastrostomy: Secondary | ICD-10-CM | POA: Diagnosis not present

## 2021-02-03 DIAGNOSIS — J449 Chronic obstructive pulmonary disease, unspecified: Secondary | ICD-10-CM | POA: Diagnosis not present

## 2021-02-03 DIAGNOSIS — K589 Irritable bowel syndrome without diarrhea: Secondary | ICD-10-CM | POA: Diagnosis not present

## 2021-02-03 DIAGNOSIS — I10 Essential (primary) hypertension: Secondary | ICD-10-CM | POA: Diagnosis not present

## 2021-02-03 DIAGNOSIS — N3010 Interstitial cystitis (chronic) without hematuria: Secondary | ICD-10-CM | POA: Diagnosis not present

## 2021-02-03 DIAGNOSIS — E785 Hyperlipidemia, unspecified: Secondary | ICD-10-CM | POA: Diagnosis not present

## 2021-02-03 DIAGNOSIS — D649 Anemia, unspecified: Secondary | ICD-10-CM | POA: Diagnosis not present

## 2021-02-03 DIAGNOSIS — G894 Chronic pain syndrome: Secondary | ICD-10-CM | POA: Diagnosis not present

## 2021-02-03 DIAGNOSIS — F419 Anxiety disorder, unspecified: Secondary | ICD-10-CM | POA: Diagnosis not present

## 2021-02-03 DIAGNOSIS — K219 Gastro-esophageal reflux disease without esophagitis: Secondary | ICD-10-CM | POA: Diagnosis not present

## 2021-02-03 DIAGNOSIS — E43 Unspecified severe protein-calorie malnutrition: Secondary | ICD-10-CM | POA: Diagnosis not present

## 2021-02-03 DIAGNOSIS — M797 Fibromyalgia: Secondary | ICD-10-CM | POA: Diagnosis not present

## 2021-02-03 DIAGNOSIS — R1310 Dysphagia, unspecified: Secondary | ICD-10-CM | POA: Diagnosis not present

## 2021-02-03 DIAGNOSIS — F319 Bipolar disorder, unspecified: Secondary | ICD-10-CM | POA: Diagnosis not present

## 2021-02-04 DIAGNOSIS — K589 Irritable bowel syndrome without diarrhea: Secondary | ICD-10-CM | POA: Diagnosis not present

## 2021-02-04 DIAGNOSIS — Z431 Encounter for attention to gastrostomy: Secondary | ICD-10-CM | POA: Diagnosis not present

## 2021-02-04 DIAGNOSIS — I10 Essential (primary) hypertension: Secondary | ICD-10-CM | POA: Diagnosis not present

## 2021-02-04 DIAGNOSIS — E43 Unspecified severe protein-calorie malnutrition: Secondary | ICD-10-CM | POA: Diagnosis not present

## 2021-02-04 DIAGNOSIS — J449 Chronic obstructive pulmonary disease, unspecified: Secondary | ICD-10-CM | POA: Diagnosis not present

## 2021-02-04 DIAGNOSIS — D649 Anemia, unspecified: Secondary | ICD-10-CM | POA: Diagnosis not present

## 2021-02-04 DIAGNOSIS — F419 Anxiety disorder, unspecified: Secondary | ICD-10-CM | POA: Diagnosis not present

## 2021-02-04 DIAGNOSIS — M1990 Unspecified osteoarthritis, unspecified site: Secondary | ICD-10-CM | POA: Diagnosis not present

## 2021-02-04 DIAGNOSIS — K219 Gastro-esophageal reflux disease without esophagitis: Secondary | ICD-10-CM | POA: Diagnosis not present

## 2021-02-04 DIAGNOSIS — F319 Bipolar disorder, unspecified: Secondary | ICD-10-CM | POA: Diagnosis not present

## 2021-02-04 DIAGNOSIS — N3010 Interstitial cystitis (chronic) without hematuria: Secondary | ICD-10-CM | POA: Diagnosis not present

## 2021-02-04 DIAGNOSIS — G894 Chronic pain syndrome: Secondary | ICD-10-CM | POA: Diagnosis not present

## 2021-02-04 DIAGNOSIS — R1310 Dysphagia, unspecified: Secondary | ICD-10-CM | POA: Diagnosis not present

## 2021-02-04 DIAGNOSIS — D75839 Thrombocytosis, unspecified: Secondary | ICD-10-CM | POA: Diagnosis not present

## 2021-02-04 DIAGNOSIS — E785 Hyperlipidemia, unspecified: Secondary | ICD-10-CM | POA: Diagnosis not present

## 2021-02-04 DIAGNOSIS — M797 Fibromyalgia: Secondary | ICD-10-CM | POA: Diagnosis not present

## 2021-02-05 DIAGNOSIS — I10 Essential (primary) hypertension: Secondary | ICD-10-CM | POA: Diagnosis not present

## 2021-02-05 DIAGNOSIS — M797 Fibromyalgia: Secondary | ICD-10-CM | POA: Diagnosis not present

## 2021-02-05 DIAGNOSIS — E43 Unspecified severe protein-calorie malnutrition: Secondary | ICD-10-CM | POA: Diagnosis not present

## 2021-02-05 DIAGNOSIS — F419 Anxiety disorder, unspecified: Secondary | ICD-10-CM | POA: Diagnosis not present

## 2021-02-05 DIAGNOSIS — K219 Gastro-esophageal reflux disease without esophagitis: Secondary | ICD-10-CM | POA: Diagnosis not present

## 2021-02-05 DIAGNOSIS — F319 Bipolar disorder, unspecified: Secondary | ICD-10-CM | POA: Diagnosis not present

## 2021-02-05 DIAGNOSIS — Z431 Encounter for attention to gastrostomy: Secondary | ICD-10-CM | POA: Diagnosis not present

## 2021-02-05 DIAGNOSIS — G894 Chronic pain syndrome: Secondary | ICD-10-CM | POA: Diagnosis not present

## 2021-02-05 DIAGNOSIS — M1990 Unspecified osteoarthritis, unspecified site: Secondary | ICD-10-CM | POA: Diagnosis not present

## 2021-02-05 DIAGNOSIS — N3010 Interstitial cystitis (chronic) without hematuria: Secondary | ICD-10-CM | POA: Diagnosis not present

## 2021-02-05 DIAGNOSIS — D75839 Thrombocytosis, unspecified: Secondary | ICD-10-CM | POA: Diagnosis not present

## 2021-02-05 DIAGNOSIS — D649 Anemia, unspecified: Secondary | ICD-10-CM | POA: Diagnosis not present

## 2021-02-05 DIAGNOSIS — E785 Hyperlipidemia, unspecified: Secondary | ICD-10-CM | POA: Diagnosis not present

## 2021-02-05 DIAGNOSIS — J449 Chronic obstructive pulmonary disease, unspecified: Secondary | ICD-10-CM | POA: Diagnosis not present

## 2021-02-05 DIAGNOSIS — K589 Irritable bowel syndrome without diarrhea: Secondary | ICD-10-CM | POA: Diagnosis not present

## 2021-02-05 DIAGNOSIS — R1310 Dysphagia, unspecified: Secondary | ICD-10-CM | POA: Diagnosis not present

## 2021-02-07 DIAGNOSIS — E46 Unspecified protein-calorie malnutrition: Secondary | ICD-10-CM | POA: Diagnosis not present

## 2021-02-07 DIAGNOSIS — Z681 Body mass index (BMI) 19 or less, adult: Secondary | ICD-10-CM | POA: Diagnosis not present

## 2021-02-08 DIAGNOSIS — K219 Gastro-esophageal reflux disease without esophagitis: Secondary | ICD-10-CM | POA: Diagnosis not present

## 2021-02-08 DIAGNOSIS — J449 Chronic obstructive pulmonary disease, unspecified: Secondary | ICD-10-CM | POA: Diagnosis not present

## 2021-02-08 DIAGNOSIS — I10 Essential (primary) hypertension: Secondary | ICD-10-CM | POA: Diagnosis not present

## 2021-02-08 DIAGNOSIS — K589 Irritable bowel syndrome without diarrhea: Secondary | ICD-10-CM | POA: Diagnosis not present

## 2021-02-08 DIAGNOSIS — Z431 Encounter for attention to gastrostomy: Secondary | ICD-10-CM | POA: Diagnosis not present

## 2021-02-08 DIAGNOSIS — D649 Anemia, unspecified: Secondary | ICD-10-CM | POA: Diagnosis not present

## 2021-02-08 DIAGNOSIS — E785 Hyperlipidemia, unspecified: Secondary | ICD-10-CM | POA: Diagnosis not present

## 2021-02-08 DIAGNOSIS — G894 Chronic pain syndrome: Secondary | ICD-10-CM | POA: Diagnosis not present

## 2021-02-08 DIAGNOSIS — F419 Anxiety disorder, unspecified: Secondary | ICD-10-CM | POA: Diagnosis not present

## 2021-02-08 DIAGNOSIS — E43 Unspecified severe protein-calorie malnutrition: Secondary | ICD-10-CM | POA: Diagnosis not present

## 2021-02-08 DIAGNOSIS — N3010 Interstitial cystitis (chronic) without hematuria: Secondary | ICD-10-CM | POA: Diagnosis not present

## 2021-02-08 DIAGNOSIS — M1990 Unspecified osteoarthritis, unspecified site: Secondary | ICD-10-CM | POA: Diagnosis not present

## 2021-02-08 DIAGNOSIS — R1310 Dysphagia, unspecified: Secondary | ICD-10-CM | POA: Diagnosis not present

## 2021-02-08 DIAGNOSIS — D75839 Thrombocytosis, unspecified: Secondary | ICD-10-CM | POA: Diagnosis not present

## 2021-02-08 DIAGNOSIS — F319 Bipolar disorder, unspecified: Secondary | ICD-10-CM | POA: Diagnosis not present

## 2021-02-08 DIAGNOSIS — M797 Fibromyalgia: Secondary | ICD-10-CM | POA: Diagnosis not present

## 2021-02-09 ENCOUNTER — Emergency Department (HOSPITAL_COMMUNITY): Payer: BC Managed Care – PPO

## 2021-02-09 ENCOUNTER — Emergency Department (HOSPITAL_COMMUNITY)
Admission: EM | Admit: 2021-02-09 | Discharge: 2021-02-09 | Disposition: A | Discharge status: Home / Self Care | Payer: BC Managed Care – PPO | Attending: Emergency Medicine | Admitting: Emergency Medicine

## 2021-02-09 ENCOUNTER — Encounter (HOSPITAL_COMMUNITY): Payer: Self-pay | Admitting: Emergency Medicine

## 2021-02-09 ENCOUNTER — Other Ambulatory Visit: Payer: Self-pay

## 2021-02-09 DIAGNOSIS — K2970 Gastritis, unspecified, without bleeding: Secondary | ICD-10-CM | POA: Diagnosis not present

## 2021-02-09 DIAGNOSIS — Z7951 Long term (current) use of inhaled steroids: Secondary | ICD-10-CM | POA: Diagnosis not present

## 2021-02-09 DIAGNOSIS — J45909 Unspecified asthma, uncomplicated: Secondary | ICD-10-CM | POA: Diagnosis not present

## 2021-02-09 DIAGNOSIS — N9489 Other specified conditions associated with female genital organs and menstrual cycle: Secondary | ICD-10-CM | POA: Insufficient documentation

## 2021-02-09 DIAGNOSIS — R000 Tachycardia, unspecified: Secondary | ICD-10-CM | POA: Insufficient documentation

## 2021-02-09 DIAGNOSIS — R1084 Generalized abdominal pain: Secondary | ICD-10-CM | POA: Diagnosis not present

## 2021-02-09 DIAGNOSIS — Z931 Gastrostomy status: Secondary | ICD-10-CM | POA: Diagnosis not present

## 2021-02-09 DIAGNOSIS — R062 Wheezing: Secondary | ICD-10-CM | POA: Diagnosis not present

## 2021-02-09 DIAGNOSIS — Z8541 Personal history of malignant neoplasm of cervix uteri: Secondary | ICD-10-CM | POA: Diagnosis not present

## 2021-02-09 DIAGNOSIS — N3289 Other specified disorders of bladder: Secondary | ICD-10-CM | POA: Diagnosis not present

## 2021-02-09 DIAGNOSIS — Z79899 Other long term (current) drug therapy: Secondary | ICD-10-CM | POA: Diagnosis not present

## 2021-02-09 DIAGNOSIS — D72829 Elevated white blood cell count, unspecified: Secondary | ICD-10-CM | POA: Diagnosis not present

## 2021-02-09 DIAGNOSIS — Z9049 Acquired absence of other specified parts of digestive tract: Secondary | ICD-10-CM | POA: Diagnosis not present

## 2021-02-09 DIAGNOSIS — R109 Unspecified abdominal pain: Secondary | ICD-10-CM | POA: Diagnosis not present

## 2021-02-09 DIAGNOSIS — R600 Localized edema: Secondary | ICD-10-CM | POA: Diagnosis not present

## 2021-02-09 DIAGNOSIS — J449 Chronic obstructive pulmonary disease, unspecified: Secondary | ICD-10-CM | POA: Insufficient documentation

## 2021-02-09 DIAGNOSIS — Z4682 Encounter for fitting and adjustment of non-vascular catheter: Secondary | ICD-10-CM | POA: Diagnosis not present

## 2021-02-09 DIAGNOSIS — Z431 Encounter for attention to gastrostomy: Secondary | ICD-10-CM | POA: Diagnosis not present

## 2021-02-09 DIAGNOSIS — I10 Essential (primary) hypertension: Secondary | ICD-10-CM | POA: Diagnosis not present

## 2021-02-09 DIAGNOSIS — F17210 Nicotine dependence, cigarettes, uncomplicated: Secondary | ICD-10-CM | POA: Insufficient documentation

## 2021-02-09 LAB — CBC WITH DIFFERENTIAL/PLATELET
Abs Immature Granulocytes: 0.16 10*3/uL — ABNORMAL HIGH (ref 0.00–0.07)
Basophils Absolute: 0.1 10*3/uL (ref 0.0–0.1)
Basophils Relative: 1 %
Eosinophils Absolute: 0.2 10*3/uL (ref 0.0–0.5)
Eosinophils Relative: 1 %
HCT: 39.3 % (ref 36.0–46.0)
Hemoglobin: 13 g/dL (ref 12.0–15.0)
Immature Granulocytes: 1 %
Lymphocytes Relative: 20 %
Lymphs Abs: 4.4 10*3/uL — ABNORMAL HIGH (ref 0.7–4.0)
MCH: 31.4 pg (ref 26.0–34.0)
MCHC: 33.1 g/dL (ref 30.0–36.0)
MCV: 94.9 fL (ref 80.0–100.0)
Monocytes Absolute: 1 10*3/uL (ref 0.1–1.0)
Monocytes Relative: 5 %
Neutro Abs: 16.1 10*3/uL — ABNORMAL HIGH (ref 1.7–7.7)
Neutrophils Relative %: 72 %
Platelets: 683 10*3/uL — ABNORMAL HIGH (ref 150–400)
RBC: 4.14 MIL/uL (ref 3.87–5.11)
RDW: 12.6 % (ref 11.5–15.5)
WBC: 22 10*3/uL — ABNORMAL HIGH (ref 4.0–10.5)
nRBC: 0 % (ref 0.0–0.2)

## 2021-02-09 LAB — I-STAT BETA HCG BLOOD, ED (MC, WL, AP ONLY): I-stat hCG, quantitative: <5 m[IU]/mL (ref <5)

## 2021-02-09 LAB — COMPREHENSIVE METABOLIC PANEL
ALT: 18 U/L (ref 0–44)
AST: 36 U/L (ref 15–41)
Albumin: 4 g/dL (ref 3.5–5.0)
Alkaline Phosphatase: 72 U/L (ref 38–126)
Anion gap: 8 (ref 5–15)
BUN: 7 mg/dL (ref 6–20)
CO2: 24 mmol/L (ref 22–32)
Calcium: 9.2 mg/dL (ref 8.9–10.3)
Chloride: 107 mmol/L (ref 98–111)
Creatinine, Ser: 0.56 mg/dL (ref 0.44–1.00)
GFR, Estimated: >60 mL/min (ref >60)
Glucose, Bld: 109 mg/dL — ABNORMAL HIGH (ref 70–99)
Potassium: 3.7 mmol/L (ref 3.5–5.1)
Sodium: 139 mmol/L (ref 135–145)
Total Bilirubin: 0.3 mg/dL (ref 0.3–1.2)
Total Protein: 7.7 g/dL (ref 6.5–8.1)

## 2021-02-09 LAB — URINALYSIS, ROUTINE W REFLEX MICROSCOPIC
Bilirubin Urine: NEGATIVE
Glucose, UA: NEGATIVE mg/dL
Hgb urine dipstick: NEGATIVE
Ketones, ur: NEGATIVE mg/dL
Leukocytes,Ua: NEGATIVE
Nitrite: NEGATIVE
Protein, ur: NEGATIVE mg/dL
Specific Gravity, Urine: 1.03 (ref 1.005–1.030)
pH: 7 (ref 5.0–8.0)

## 2021-02-09 LAB — LIPASE, BLOOD: Lipase: 23 U/L (ref 11–51)

## 2021-02-09 LAB — LACTIC ACID, PLASMA: Lactic Acid, Venous: 1 mmol/L (ref 0.5–1.9)

## 2021-02-09 MED ORDER — ONDANSETRON HCL 4 MG PO TABS: 4.0000 mg | ORAL_TABLET | Freq: Three times a day (TID) | ORAL | 0 refills | Status: DC | PRN

## 2021-02-09 MED ORDER — HYDROMORPHONE HCL 1 MG/ML IJ SOLN
1.0000 mg | Freq: Once | INTRAMUSCULAR | Status: AC
  Administered 2021-02-09: 1 mg via INTRAVENOUS
  Filled 2021-02-09: qty 1

## 2021-02-09 MED ORDER — HYDROMORPHONE HCL 1 MG/ML IJ SOLN
0.5000 mg | Freq: Once | INTRAMUSCULAR | Status: AC
  Administered 2021-02-09: 0.5 mg via INTRAVENOUS
  Filled 2021-02-09: qty 1

## 2021-02-09 MED ORDER — LORAZEPAM 0.5 MG PO TABS
0.5000 mg | ORAL_TABLET | Freq: Once | ORAL | Status: DC
  Filled 2021-02-09: qty 1

## 2021-02-09 MED ORDER — ONDANSETRON HCL 4 MG/2ML IJ SOLN
4.0000 mg | Freq: Once | INTRAMUSCULAR | Status: AC
  Administered 2021-02-09: 4 mg via INTRAVENOUS
  Filled 2021-02-09: qty 2

## 2021-02-09 MED ORDER — PROCHLORPERAZINE EDISYLATE 10 MG/2ML IJ SOLN
10.0000 mg | Freq: Once | INTRAMUSCULAR | Status: AC
  Administered 2021-02-09: 10 mg via INTRAVENOUS
  Filled 2021-02-09: qty 2

## 2021-02-09 MED ORDER — PROMETHAZINE HCL 12.5 MG PO TABS
12.5000 mg | ORAL_TABLET | Freq: Once | ORAL | Status: AC
  Administered 2021-02-09: 12.5 mg via ORAL
  Filled 2021-02-09: qty 1

## 2021-02-09 MED ORDER — LORAZEPAM 2 MG/ML IJ SOLN
1.0000 mg | Freq: Once | INTRAMUSCULAR | Status: AC
  Administered 2021-02-09: 1 mg via INTRAVENOUS
  Filled 2021-02-09: qty 1

## 2021-02-09 MED ORDER — DIATRIZOATE MEGLUMINE & SODIUM 66-10 % PO SOLN
60.0000 mL | Freq: Once | ORAL | Status: DC
Start: 2021-02-09 — End: 2021-02-10
  Filled 2021-02-09: qty 60

## 2021-02-09 MED ORDER — IOHEXOL 300 MG/ML  SOLN
100.0000 mL | Freq: Once | INTRAMUSCULAR | Status: AC | PRN
  Administered 2021-02-09: 100 mL via INTRAVENOUS

## 2021-02-09 MED ORDER — SODIUM CHLORIDE 0.9 % IV BOLUS: 1000.0000 mL | Freq: Once | INTRAVENOUS | Status: DC

## 2021-02-09 MED ORDER — DIATRIZOATE MEGLUMINE & SODIUM 66-10 % PO SOLN
ORAL | Status: AC
  Administered 2021-02-09: 50 mL
  Filled 2021-02-09: qty 60

## 2021-02-09 MED ORDER — SODIUM CHLORIDE 0.9 % IV BOLUS
500.0000 mL | Freq: Once | INTRAVENOUS | Status: AC
  Administered 2021-02-09: 500 mL via INTRAVENOUS

## 2021-02-09 NOTE — ED Triage Notes (Signed)
Pt from home via [facility] with C/O G tube becoming dislodged yesterday. Pt states she "put it back in." Now pt c/o abdominal pain and nausea.

## 2021-02-09 NOTE — ED Provider Notes (Signed)
[facility] Provider Note   CSN: [id number] Arrival date & time: 02/09/21  1238     History Chief Complaint  Patient presents with   Abdominal Pain    [name] is a 45 y.o. female.  HPI  Patient with significant medical history including hypertension, hyperlipidemia, chronic interstitial cystitis, COPD, pancreatitis, bipolar, anxiety, failure to thrive with PEG tube placed by IR on 10/20 presents with chief complaint of stomach pain.  Patient states yesterday she was at the store and unfortunately her tube got caught on a case of soda and partially pulled it out.  She states it came out approximately a few inches but the ball never came completely out of the stomach.  Patient was able to place it back in without difficulty.  She states that she tried to take some of her home meds last night but she vomited right back up.  She states that she had severe pain in her stomach, and unable to tolerate p.o.  She has no other complaints this time, she does not endorse fevers, chills, chest pain, shortness of breath, urinary symptoms, denies constipation, worsening diarrhea, she denies hematemesis, coffee-ground emesis, hematochezia, melena.  She denies any alleviating or aggravating factors.  After reviewing patient's chart patient was admitted to the hospital from 10/13-10/25 admitted for stomach pain bladder spasms nausea and vomiting, found that she had a UTI was started on antibiotics and completed her course, during this course patient had a PEG tube and placed for treatment of her failure to thrive.  Past Medical History:  Diagnosis Date   Anal fissure 03/11/2009   Anemia    Anxiety and depression    ARTHRITIS 08/31/2007   Asthma    BENZODIAZEPINE ADDICTION 08/31/2007   Bipolar 1 disorder (HCC)    Bowel obstruction (HCC)    Breakdown of urinary electronic stimulator device, init (HCC)    BRONCHITIS, RECURRENT 08/23/2009   Asthmatic Bronchitis-Dr. Melvyn Novas.....-HFA  75% 12/04/2008>75% 02/05/2009>75% 08/04/2009 -PFT's 01/04/2009 2.56 (86%) ratio 75, no resp to B2 and DLC0 67% > 80 after correction    Cancer (HCC)    cervical cancer   Chronic interstitial cystitis 03/11/2009   Chronic nausea    Chronic pain    COLONIC POLYPS, HX OF 07/25/2006   ADENOMATOUS POLYP   COPD (chronic obstructive pulmonary disease) (HCC)    Endometriosis    FIBROMYALGIA 08/31/2007   GERD 02/05/2009   Hyperlipidemia    HYPERTENSION 08/31/2007   IBS 03/11/2009   Internal hemorrhoids    Migraine headache    NEPHROLITHIASIS 08/31/2007   Pancreatitis    PONV (postoperative nausea and vomiting)    RECTAL BLEEDING 03/11/2009   Sciatica    right leg   Seizures ([place])    been about 1 year since last seisure per pt   SLEEP APNEA 08/31/2007   Substance abuse ([place])    Thyroid disease    Uterine cyst     Patient Active Problem List   Diagnosis Date Noted   Dysphagia    UTI (urinary tract infection) 01/21/2021   Thrombocytosis 01/21/2021   Elevated lipase 01/21/2021   Hypokalemia 01/21/2021   Fibromyalgia 01/21/2021   History of pancreatitis 01/21/2021   Nausea without vomiting 05/24/2020   Abdominal pain 05/24/2020   Severe protein-calorie malnutrition ([place]) 05/24/2020   Encounter by telehealth for suspected COVID-19 03/20/2019   Abdominal pain, epigastric 10/07/2018   Intractable vomiting 08/09/2018   Compression fracture of fifth lumbar vertebra ([place]) 02/07/2018   Altered mental  status 02/07/2018   Generalized weakness 01/24/2018   MDD (major depressive disorder) 04/23/2017   Substance abuse ([place]) 04/19/2017   Hyperthyroidism 02/17/2015   Anxiety 01/28/2015   Clinical depression 01/28/2015   COPD with acute exacerbation ([place]) 11/26/2013   Vaginitis and vulvovaginitis 11/26/2013   Recurrent pneumonia 02/02/2013   Left arm pain 01/13/2013   Left arm pain 01/13/2013    Class: Acute   Dizziness and giddiness 01/13/2013   Acute bronchitis 12/07/2012   Rib  pain 12/07/2012   Insomnia 08/26/2012   Diarrhea 06/19/2012   Heme positive stool 06/19/2012   Bloating 06/19/2012   Nausea alone 06/19/2012   Bladder retention 04/24/2012   Tremor 03/05/2012   Dysuria 03/05/2012   Failure to thrive in adult 06/21/2011   Airway hyperreactivity 03/08/2011   Back pain, chronic 03/08/2011   Chronic obstructive pulmonary disease ([place]) 03/08/2011   Acid reflux 03/08/2011   Cystitis 01/22/2011   Bilateral hand numbness 01/13/2011   WEIGHT LOSS 06/24/2010   RIB PAIN, LEFT SIDED 05/04/2010   ABDOMINAL PAIN, LEFT UPPER QUADRANT 05/04/2010   Unspecified otitis media 10/26/2009   BRONCHITIS, RECURRENT 08/23/2009   URI, ACUTE 08/04/2009   FATIGUE 07/06/2009   IBS 03/11/2009   ANAL FISSURE 03/11/2009   RECTAL BLEEDING 03/11/2009   Chronic interstitial cystitis 03/11/2009   COLONIC POLYPS, HX OF 03/11/2009   GERD 02/05/2009   SMOKER 12/04/2008   chronic asthma poorly controlled 12/04/2008   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   BENZODIAZEPINE ADDICTION 08/31/2007   Bipolar disorder ([place]) 08/31/2007   HYPERTENSION 08/31/2007   EMPHYSEMA 08/31/2007   NEPHROLITHIASIS 08/31/2007   ARTHRITIS 08/31/2007   FIBROMYALGIA 08/31/2007   SLEEP APNEA 08/31/2007    Past Surgical History:  Procedure Laterality Date   ABDOMINAL HYSTERECTOMY     BIOPSY  10/25/2018   Procedure: BIOPSY;  Surgeon: Rogene Houston, MD;  [facility];  Service: Endoscopy;;  gastric   BIOPSY  06/11/2020   Procedure: BIOPSY;  Surgeon: Montez Morita, Quillian Quince, MD;  Location[facility];  Service: Gastroenterology;;  small bowel   bladder stretching x6     BLADDER SURGERY     stimulator placed and stretching    CHOLECYSTECTOMY     COLONOSCOPY     COLONOSCOPY WITH PROPOFOL N/A 09/03/2014   Procedure: COLONOSCOPY WITH PROPOFOL;  Surgeon: Milus Banister, MD;  Location: [facility];  Service: Endoscopy;  Laterality: N/A;   ESOPHAGOGASTRODUODENOSCOPY (EGD) WITH PROPOFOL N/A  10/25/2018   Procedure: ESOPHAGOGASTRODUODENOSCOPY (EGD) WITH PROPOFOL;  Surgeon: Rogene Houston, MD;  [facility];  Service: Endoscopy;  Laterality: N/A;  11:15   ESOPHAGOGASTRODUODENOSCOPY (EGD) WITH PROPOFOL N/A 06/11/2020   Procedure: ESOPHAGOGASTRODUODENOSCOPY (EGD) WITH PROPOFOL;  Surgeon: Harvel Quale, MD;  [facility];  Service: Gastroenterology;  Laterality: N/A;   interstitial cystitis     IR GASTROSTOMY TUBE MOD SED  01/27/2021   PACEMAKER INSERTION     in hip for interstitial cystitis   pacemaker removal     removal of uterine cyst and scrapped uterus     replaced bladder pacemaker       OB History     Gravida  3   Para  2   Term      Preterm      AB  1   Living  2      SAB  1   IAB  0   Ectopic  0   Multiple  0   Live Births  2           Family History  Problem Relation Age of Onset   Heart disease Father    Asthma Maternal Grandmother    Emphysema Maternal Grandfather    Cancer Maternal Grandfather        Lung Cancer   Cancer Other        Lung Cancer-Aunt   Colon cancer Neg Hx    Esophageal cancer Neg Hx    Rectal cancer Neg Hx    Stomach cancer Neg Hx    Thyroid disease Neg Hx     Social History   Tobacco Use   Smoking status: Every Day    Packs/day: 0.50    Years: 14.00    Pack years: 7.00    Types: Cigarettes   Smokeless tobacco: Never  Vaping Use   Vaping Use: Some days  Substance Use Topics   Alcohol use: Not Currently    Comment: last drink 3 weeks ago; recently released from rehab   Drug use: Not Currently    Types: Marijuana    Comment: once a month    Home Medications Prior to Admission medications   Medication Sig Start Date End Date Taking? Authorizing Provider  acetaminophen (TYLENOL) 500 MG tablet Take 1,000 mg by mouth every 6 (six) hours as needed for mild pain, fever or headache.    [provider]  albuterol (PROAIR HFA) 108 (90 Base) MCG/ACT inhaler INHALE 2  PUFFS EVERY 6 HOURS AS NEEDED FOR SHORTNESS OF BREATH AND WHEEZING. Patient taking differently: No sig reported 02/27/20   Carollee Herter, Alferd Apa, DO  clonazePAM (KLONOPIN) 0.5 MG tablet Take 1 tablet (0.5 mg total) by mouth 3 (three) times daily as needed for anxiety. 05/01/19   [name], DO  cyclobenzaprine (FLEXERIL) 5 MG tablet Take 1 tablet (5 mg total) by mouth 3 (three) times daily as needed for muscle spasms. 05/21/20   Mordecai Rasmussen, MD  estradiol (ESTRACE) 0.1 MG/GM vaginal cream Place 1 Applicatorful vaginally every 3 (three) days. 11/21/19   [provider]  estradiol (ESTRACE) 2 MG tablet Take 2 mg by mouth daily.    [provider]  fluticasone (FLONASE) 50 MCG/ACT nasal spray Place 2 sprays into both nostrils daily. Patient taking differently: Place 2 sprays into both nostrils daily as needed for allergies. 04/22/19   [name], DO  gabapentin (NEURONTIN) 800 MG tablet Take 0.5 tablets (400 mg total) by mouth in the morning, at noon, in the evening, and at bedtime. 02/01/21   Barb Merino, MD  HYDROcodone-acetaminophen (NORCO/VICODIN) 5-325 MG tablet Take 1 tablet by mouth 3 (three) times daily as needed. 02/01/21   Barb Merino, MD  hydrOXYzine (ATARAX/VISTARIL) 50 MG tablet Take 50 mg by mouth at bedtime. 08/26/18   [provider]  Hyoscyamine Sulfate SL 0.125 MG SUBL Take 0.125 mg by mouth every 4 (four) hours as needed (for cramping). 11/04/19   [provider]  mirtazapine (REMERON) 15 MG tablet Take 15 mg by mouth at bedtime. 11/24/20   [provider]  omeprazole (PRILOSEC) 40 MG capsule TAKE 1 CAPSULE BY MOUTH ONCE DAILY. Patient taking differently: Take 40 mg by mouth daily. 06/22/20   Roma Schanz R, DO  ondansetron (ZOFRAN-ODT) 8 MG disintegrating tablet Take 1 tablet (8 mg total) by mouth every 8 (eight) hours as needed for nausea or vomiting. 04/18/30   Delora Fuel, MD  oxybutynin (DITROPAN XL) 15  MG 24 hr  tablet Take 15 mg by mouth daily. 09/16/19   [provider]  promethazine (PHENERGAN) 25 MG tablet TAKE (1) TABLET EVERY SIX HOURS AS NEEDED FOR NAUSEA AND VOMITING. Patient taking differently: Take 25 mg by mouth every 6 (six) hours as needed for nausea or vomiting. 11/08/20   Roma Schanz R, DO  QUEtiapine (SEROQUEL) 400 MG tablet Take 400 mg by mouth at bedtime.  09/16/18   [provider]  RESTASIS 0.05 % ophthalmic emulsion Place 1 drop into both eyes 2 (two) times daily. 02/26/20   [provider]  SYMBICORT 80-4.5 MCG/ACT inhaler INHALE 2 PUFFS INTO THE LUNGS TWICE DAILY. Patient taking differently: Inhale 2 puffs into the lungs in the morning and at bedtime. 12/28/20   Saguier, Percell Miller, PA-C    Allergies    Abilify [aripiprazole], Amitriptyline, Metoclopramide hcl, Propoxyphene, Toradol [ketorolac tromethamine], Tramadol, Ambien [zolpidem tartrate], Eszopiclone, Varenicline, Buprenorphine hcl, Demerol [meperidine], Emetrol, and Morphine and related  Review of Systems   Review of Systems  Constitutional:  Negative for chills and fever.  HENT:  Negative for congestion.   Respiratory:  Negative for shortness of breath.   Cardiovascular:  Negative for chest pain.  Gastrointestinal:  Positive for abdominal pain, diarrhea, nausea and vomiting.  Genitourinary:  Negative for enuresis.  Musculoskeletal:  Negative for back pain.  Skin:  Negative for rash.  Neurological:  Negative for dizziness.  Hematological:  Does not bruise/bleed easily.   Physical Exam Updated Vital Signs BP (!) 130/98 (BP Location: Right Arm)   Pulse (!) 115   Temp 98.2 F (36.8 C) (Oral)   Resp (!) 22   Ht 5\' 4"  (1.626 m)   Wt 42.2 kg   SpO2 100%   BMI 15.96 kg/m   Physical Exam Vitals and nursing note reviewed.  Constitutional:      General: She is in acute distress.     Appearance: She is not ill-appearing.     Comments: On examination patient was in acute distress  in the fetal position to her stomach pain, vital signs significant for tachycardia.  HENT:     Head: Normocephalic and atraumatic.     Nose: No congestion.     Mouth/Throat:     Mouth: Mucous membranes are moist.     Pharynx: Oropharynx is clear.  Eyes:     Conjunctiva/sclera: Conjunctivae normal.  Cardiovascular:     Rate and Rhythm: Regular rhythm. Tachycardia present.     Pulses: Normal pulses.     Heart sounds: No murmur heard.   No friction rub. No gallop.  Pulmonary:     Effort: No respiratory distress.     Breath sounds: No wheezing, rhonchi or rales.  Abdominal:     General: There is no distension.     Palpations: Abdomen is soft.     Tenderness: There is abdominal tenderness. There is no right CVA tenderness or left CVA tenderness.     Comments: Abdomen was nondistended, PEG tube in place, no surrounding erythema or edema, no drainage discharge noted, she had generalized tenderness to palpation, did not focalized, there is no guarding, rebound tenderness, peritoneal sign,  Musculoskeletal:     Right lower leg: No edema.     Left lower leg: No edema.  Skin:    General: Skin is warm and dry.  Neurological:     Mental Status: She is alert.  Psychiatric:        Mood and Affect: Mood normal.    ED Results /  Procedures / Treatments   Labs (all labs ordered are listed, but only abnormal results are displayed) Labs Reviewed  COMPREHENSIVE METABOLIC PANEL - Abnormal; Notable for the following components:      Result Value   Glucose, Bld 109 (*)    All other components within normal limits  CBC WITH DIFFERENTIAL/PLATELET - Abnormal; Notable for the following components:   WBC 22.0 (*)    Platelets 683 (*)    Neutro Abs 16.1 (*)    Lymphs Abs 4.4 (*)    Abs Immature Granulocytes 0.16 (*)    All other components within normal limits  LIPASE, BLOOD  I-STAT BETA HCG BLOOD, ED ([facility], [facility], [facility] ONLY)    EKG None  Radiology DG ABDOMEN PEG TUBE LOCATION  Result Date:  02/09/2021 CLINICAL DATA:  Dislodged/replaced G-tube EXAM: ABDOMEN - 1 VIEW COMPARISON:  01/28/2021 FINDINGS: 50 cc of Gastrografin was instilled via the replaced gastrostomy tube and and image of the abdomen was obtained. Contrast opacifies gastric lumen consistent with internal gastric position of the PEG tube. No contrast extravasation. Nonobstructive bowel gas pattern. IMPRESSION: Replaced gastrostomy tube is positioned within the stomach without contrast extravasation. Electronically Signed   By: Lavonia Dana M.D.   On: 02/09/2021 15:52    Procedures Procedures   Medications Ordered in ED Medications  diatrizoate meglumine-sodium (GASTROGRAFIN) 66-10 % solution 60 mL (has no administration in time range)  promethazine (PHENERGAN) tablet 12.5 mg (12.5 mg Oral Given 02/09/21 1504)  HYDROmorphone (DILAUDID) injection 1 mg (1 mg Intravenous Given 02/09/21 1503)  diatrizoate meglumine-sodium (GASTROGRAFIN) 66-10 % solution (50 mLs  Given 02/09/21 1537)  HYDROmorphone (DILAUDID) injection 0.5 mg (0.5 mg Intravenous Given 02/09/21 1602)  ondansetron (ZOFRAN) injection 4 mg (4 mg Intravenous Given 02/09/21 1602)  LORazepam (ATIVAN) injection 1 mg (1 mg Intravenous Given 02/09/21 1652)    ED Course  I have reviewed the triage vital signs and the nursing notes.  Pertinent labs & imaging results that were available during my care of the patient were reviewed by me and considered in my medical decision making (see chart for details).    MDM Rules/Calculators/A&P                          Initial impression-presents with stomach pain.  She is alert, appears to be in acute stress, vital signs notable for tachycardia.  Concern for possible PEG tube misplacement will provide patient with pain medications, nausea medication, obtain imaging and reassess.  Work-up-CBC shows leukocytosis of 22, CMP elevated glucose of 109, lipase 23, hCG less than 5, lactic was 1 UA unremarkable, DG abdomen reveals replace  G-tube positioning within the stomach without contrast extravasation.  CT abdomen chest reveals G-tube in the body of the stomach there is mild body wall edema without focal inflammation or fluid collection  Reassessment-patient was given Dilaudid and pain medication but is still in significant mount of pain, pain exceeds my expectations, possible she could be having abscess, since tube was recently in place obtain basic lab work-up and reassess.   will also provide her with anxiety medication as she states she has not had any of this today.  Noted that patient has a white count of 22, concerned for intra-abdominal infection, will add on lactic, blood cultures, and send down for CT abdomen pelvis for further evaluation.  Patient  reassessed,  she is resting comfortably in bed, without any complaints, vital signs are reassuring.  We will continue to  monitor.  Patient came back from CT in the fetal position complaining of pain, assessed the patient now endorsing a migraine, and continue having some stomach pain, no focal deficits present, will provide with a migraine cocktail continue to monitor.  Patient reassessed resting comfortably in bed, she is tolerating p.o., has no complaints, lab or imaging all unremarkable she is agreed for discharge at this time.  Rule out-I have low suspicion for systemic infection as patient is nontoxic-appearing, no obvious source of infection present on exam or within lab work.  It is noted that she has leukocytosis and was tachycardic but I feel this is more an acute phase reactant as there is no intra-abdominal infection present, UA was unremarkable, patient is afebrile, her lactic was 1.  X-ray was deferred at this time as she was having no chest pain no shortness of breath, no cough, lung sounds are clear bilaterally.  I have low suspicion for intra-abdominal abscess, G-tube misplacement, Pilo, kidney stones, bowel obstruction CT imaging are negative for these findings.   Low suspicion for UTI, Pilo, kidney stone Ts[place]i is negative for signs of infection.  Low suspicion for pancreatitis as lipase within normal limits.  Low suspicion for dissection of the aorta as presentation atypical etiology.  Plan-  Stomach pain-unclear etiology but I suspect this is more acute on chronic as she has had chronic abdominal pain in the past, there is no obvious source infection present.  will have her continue with her home medications, gave her strict return precautions, will have her follow-up with PCP for reevaluation of her white count.  Vital signs have remained stable, no indication for hospital admission.  Patient discussed with attending and they agreed with assessment and plan.  Patient given at home care as well strict return precautions.  Patient verbalized that they understood agreed to said plan.  Final Clinical Impression(s) / ED Diagnoses Final diagnoses:  S/P percutaneous endoscopic gastrostomy (PEG) tube placement [facility])    Rx / DC Orders ED Discharge Orders     None        Marcello Fennel, PA-C 02/09/21 2255    Varney Biles, MD 02/11/21 [phone]

## 2021-02-09 NOTE — Discharge Instructions (Signed)
Imaging was all reassuring, lab work shows that you have a slightly elevated white count, please follow-up with your PCP for further evaluation of this, would recommend follow-up in the next week for repeat CBC.  prescribe you some Zofran please use needed for nausea, please continue all home medication as prescribed.  Come back to the emergency department if you develop chest pain, shortness of breath, severe abdominal pain, uncontrolled nausea, vomiting, diarrhea.

## 2021-02-10 DIAGNOSIS — R1310 Dysphagia, unspecified: Secondary | ICD-10-CM | POA: Diagnosis not present

## 2021-02-10 DIAGNOSIS — I10 Essential (primary) hypertension: Secondary | ICD-10-CM | POA: Diagnosis not present

## 2021-02-10 DIAGNOSIS — D649 Anemia, unspecified: Secondary | ICD-10-CM | POA: Diagnosis not present

## 2021-02-10 DIAGNOSIS — M1990 Unspecified osteoarthritis, unspecified site: Secondary | ICD-10-CM | POA: Diagnosis not present

## 2021-02-10 DIAGNOSIS — J449 Chronic obstructive pulmonary disease, unspecified: Secondary | ICD-10-CM | POA: Diagnosis not present

## 2021-02-10 DIAGNOSIS — N3010 Interstitial cystitis (chronic) without hematuria: Secondary | ICD-10-CM | POA: Diagnosis not present

## 2021-02-10 DIAGNOSIS — F419 Anxiety disorder, unspecified: Secondary | ICD-10-CM | POA: Diagnosis not present

## 2021-02-10 DIAGNOSIS — K589 Irritable bowel syndrome without diarrhea: Secondary | ICD-10-CM | POA: Diagnosis not present

## 2021-02-10 DIAGNOSIS — E43 Unspecified severe protein-calorie malnutrition: Secondary | ICD-10-CM | POA: Diagnosis not present

## 2021-02-10 DIAGNOSIS — Z431 Encounter for attention to gastrostomy: Secondary | ICD-10-CM | POA: Diagnosis not present

## 2021-02-10 DIAGNOSIS — G894 Chronic pain syndrome: Secondary | ICD-10-CM | POA: Diagnosis not present

## 2021-02-10 DIAGNOSIS — E785 Hyperlipidemia, unspecified: Secondary | ICD-10-CM | POA: Diagnosis not present

## 2021-02-10 DIAGNOSIS — K219 Gastro-esophageal reflux disease without esophagitis: Secondary | ICD-10-CM | POA: Diagnosis not present

## 2021-02-10 DIAGNOSIS — D75839 Thrombocytosis, unspecified: Secondary | ICD-10-CM | POA: Diagnosis not present

## 2021-02-10 DIAGNOSIS — F319 Bipolar disorder, unspecified: Secondary | ICD-10-CM | POA: Diagnosis not present

## 2021-02-10 DIAGNOSIS — M797 Fibromyalgia: Secondary | ICD-10-CM | POA: Diagnosis not present

## 2021-02-11 ENCOUNTER — Encounter (HOSPITAL_BASED_OUTPATIENT_CLINIC_OR_DEPARTMENT_OTHER): Payer: Self-pay

## 2021-02-11 ENCOUNTER — Emergency Department (HOSPITAL_BASED_OUTPATIENT_CLINIC_OR_DEPARTMENT_OTHER)
Admission: EM | Admit: 2021-02-11 | Discharge: 2021-02-12 | Disposition: A | Discharge status: Home / Self Care | Payer: BC Managed Care – PPO | Attending: Emergency Medicine | Admitting: Emergency Medicine

## 2021-02-11 DIAGNOSIS — Z79899 Other long term (current) drug therapy: Secondary | ICD-10-CM | POA: Diagnosis not present

## 2021-02-11 DIAGNOSIS — K9423 Gastrostomy malfunction: Secondary | ICD-10-CM | POA: Diagnosis not present

## 2021-02-11 DIAGNOSIS — R1084 Generalized abdominal pain: Secondary | ICD-10-CM | POA: Diagnosis not present

## 2021-02-11 DIAGNOSIS — I10 Essential (primary) hypertension: Secondary | ICD-10-CM | POA: Diagnosis not present

## 2021-02-11 DIAGNOSIS — R350 Frequency of micturition: Secondary | ICD-10-CM | POA: Diagnosis not present

## 2021-02-11 DIAGNOSIS — F17210 Nicotine dependence, cigarettes, uncomplicated: Secondary | ICD-10-CM | POA: Insufficient documentation

## 2021-02-11 DIAGNOSIS — R000 Tachycardia, unspecified: Secondary | ICD-10-CM | POA: Diagnosis not present

## 2021-02-11 DIAGNOSIS — Z950 Presence of cardiac pacemaker: Secondary | ICD-10-CM | POA: Insufficient documentation

## 2021-02-11 DIAGNOSIS — Z8541 Personal history of malignant neoplasm of cervix uteri: Secondary | ICD-10-CM | POA: Diagnosis not present

## 2021-02-11 DIAGNOSIS — M7918 Myalgia, other site: Secondary | ICD-10-CM | POA: Diagnosis not present

## 2021-02-11 DIAGNOSIS — F13239 Sedative, hypnotic or anxiolytic dependence with withdrawal, unspecified: Secondary | ICD-10-CM | POA: Diagnosis not present

## 2021-02-11 DIAGNOSIS — F13930 Sedative, hypnotic or anxiolytic use, unspecified with withdrawal, uncomplicated: Secondary | ICD-10-CM

## 2021-02-11 DIAGNOSIS — Z7951 Long term (current) use of inhaled steroids: Secondary | ICD-10-CM | POA: Diagnosis not present

## 2021-02-11 DIAGNOSIS — N3941 Urge incontinence: Secondary | ICD-10-CM | POA: Diagnosis not present

## 2021-02-11 DIAGNOSIS — D72829 Elevated white blood cell count, unspecified: Secondary | ICD-10-CM | POA: Insufficient documentation

## 2021-02-11 DIAGNOSIS — R109 Unspecified abdominal pain: Secondary | ICD-10-CM | POA: Diagnosis not present

## 2021-02-11 DIAGNOSIS — J441 Chronic obstructive pulmonary disease with (acute) exacerbation: Secondary | ICD-10-CM | POA: Insufficient documentation

## 2021-02-11 DIAGNOSIS — J45909 Unspecified asthma, uncomplicated: Secondary | ICD-10-CM | POA: Diagnosis not present

## 2021-02-11 DIAGNOSIS — R531 Weakness: Secondary | ICD-10-CM | POA: Diagnosis not present

## 2021-02-11 DIAGNOSIS — F419 Anxiety disorder, unspecified: Secondary | ICD-10-CM | POA: Insufficient documentation

## 2021-02-11 DIAGNOSIS — R3915 Urgency of urination: Secondary | ICD-10-CM | POA: Diagnosis not present

## 2021-02-11 DIAGNOSIS — G8929 Other chronic pain: Secondary | ICD-10-CM

## 2021-02-11 DIAGNOSIS — N3010 Interstitial cystitis (chronic) without hematuria: Secondary | ICD-10-CM | POA: Diagnosis not present

## 2021-02-11 LAB — CBC
HCT: 36.6 % (ref 36.0–46.0)
Hemoglobin: 12.3 g/dL (ref 12.0–15.0)
MCH: 31.7 pg (ref 26.0–34.0)
MCHC: 33.6 g/dL (ref 30.0–36.0)
MCV: 94.3 fL (ref 80.0–100.0)
Platelets: 636 10*3/uL — ABNORMAL HIGH (ref 150–400)
RBC: 3.88 MIL/uL (ref 3.87–5.11)
RDW: 12.9 % (ref 11.5–15.5)
WBC: 12 10*3/uL — ABNORMAL HIGH (ref 4.0–10.5)
nRBC: 0 % (ref 0.0–0.2)

## 2021-02-11 LAB — COMPREHENSIVE METABOLIC PANEL
ALT: 14 U/L (ref 0–44)
AST: 26 U/L (ref 15–41)
Albumin: 4.2 g/dL (ref 3.5–5.0)
Alkaline Phosphatase: 76 U/L (ref 38–126)
Anion gap: 10 (ref 5–15)
BUN: 6 mg/dL (ref 6–20)
CO2: 24 mmol/L (ref 22–32)
Calcium: 9.5 mg/dL (ref 8.9–10.3)
Chloride: 104 mmol/L (ref 98–111)
Creatinine, Ser: 0.67 mg/dL (ref 0.44–1.00)
GFR, Estimated: >60 mL/min (ref >60)
Glucose, Bld: 157 mg/dL — ABNORMAL HIGH (ref 70–99)
Potassium: 4.1 mmol/L (ref 3.5–5.1)
Sodium: 138 mmol/L (ref 135–145)
Total Bilirubin: 0.3 mg/dL (ref 0.3–1.2)
Total Protein: 8.3 g/dL — ABNORMAL HIGH (ref 6.5–8.1)

## 2021-02-11 LAB — LIPASE, BLOOD: Lipase: 29 U/L (ref 11–51)

## 2021-02-11 MED ORDER — LORAZEPAM 2 MG/ML IJ SOLN
2.0000 mg | Freq: Once | INTRAMUSCULAR | Status: AC
  Administered 2021-02-11: 2 mg via INTRAVENOUS
  Filled 2021-02-11: qty 1

## 2021-02-11 MED ORDER — HYDROMORPHONE HCL 1 MG/ML IJ SOLN
1.0000 mg | Freq: Once | INTRAMUSCULAR | Status: AC
  Administered 2021-02-11: 1 mg via INTRAVENOUS
  Filled 2021-02-11: qty 1

## 2021-02-11 MED ORDER — SODIUM CHLORIDE 0.9 % IV SOLN: INTRAVENOUS | Status: DC

## 2021-02-11 MED ORDER — LORAZEPAM 2 MG/ML IJ SOLN
1.0000 mg | Freq: Once | INTRAMUSCULAR | Status: AC
  Administered 2021-02-11: 1 mg via INTRAVENOUS
  Filled 2021-02-11: qty 1

## 2021-02-11 NOTE — ED Notes (Signed)
2LNC applied, as sats dipped to 88% on RA

## 2021-02-11 NOTE — ED Provider Notes (Signed)
[facility] EMERGENCY DEPARTMENT Provider Note   CSN: [id number] Arrival date & time: 02/11/21  2133     History Chief Complaint  Patient presents with   Abdominal Pain    S/p Bladder SX    [name] is a 45 y.o. female.  Patient presenting with a complaint of tremors anxiety and abdominal pain.  Records show that patient has chronic abdominal pain.  Has a G-tube in place for failure to thrive.  She gets some tube feedings there but has no upper GI obstruction.  Patient had procedure done at [facility] for interstitial cystitis by urology today.  Did fine going into the procedure post procedure started to get this anxiety most likely benzo withdrawal.  Patient is on chronic pain medication and chronic benzo diazepam's.  Patient went to [facility] ED waited there for several hours.  Then left and came here.  Patient arrived in a lot of distress mostly from the pain and the tremors.  Patient was seen at [facility] emergency department 2 days ago.  Had CT scan abdomen and pelvis without any acute findings.  And had her G-tube studied which showed that it was in proper place and that it was patent.  Regarding the G-tube patient states that it has been leaking.  At the port area.      Past Medical History:  Diagnosis Date   Anal fissure 03/11/2009   Anemia    Anxiety and depression    ARTHRITIS 08/31/2007   Asthma    BENZODIAZEPINE ADDICTION 08/31/2007   Bipolar 1 disorder (HCC)    Bowel obstruction (HCC)    Breakdown of urinary electronic stimulator device, init (HCC)    BRONCHITIS, RECURRENT 08/23/2009   Asthmatic Bronchitis-Dr. Melvyn Novas.....-HFA 75% 12/04/2008>75% 02/05/2009>75% 08/04/2009 -PFT's 01/04/2009 2.56 (86%) ratio 75, no resp to B2 and DLC0 67% > 80 after correction    Cancer (HCC)    cervical cancer   Chronic interstitial cystitis 03/11/2009   Chronic nausea    Chronic pain    COLONIC POLYPS, HX OF 07/25/2006   ADENOMATOUS POLYP   COPD (chronic  obstructive pulmonary disease) (HCC)    Endometriosis    FIBROMYALGIA 08/31/2007   GERD 02/05/2009   Hyperlipidemia    HYPERTENSION 08/31/2007   IBS 03/11/2009   Internal hemorrhoids    Migraine headache    NEPHROLITHIASIS 08/31/2007   Pancreatitis    PONV (postoperative nausea and vomiting)    RECTAL BLEEDING 03/11/2009   Sciatica    right leg   Seizures ([place])    been about 1 year since last seisure per pt   SLEEP APNEA 08/31/2007   Substance abuse ([place])    Thyroid disease    Uterine cyst     Patient Active Problem List   Diagnosis Date Noted   Dysphagia    UTI (urinary tract infection) 01/21/2021   Thrombocytosis 01/21/2021   Elevated lipase 01/21/2021   Hypokalemia 01/21/2021   Fibromyalgia 01/21/2021   History of pancreatitis 01/21/2021   Nausea without vomiting 05/24/2020   Abdominal pain 05/24/2020   Severe protein-calorie malnutrition ([place]) 05/24/2020   Encounter by telehealth for suspected COVID-19 03/20/2019   Abdominal pain, epigastric 10/07/2018   Intractable vomiting 08/09/2018   Compression fracture of fifth lumbar vertebra ([place]) 02/07/2018   Altered mental status 02/07/2018   Generalized weakness 01/24/2018   MDD (major depressive disorder) 04/23/2017   Substance abuse ([place]) 04/19/2017   Hyperthyroidism 02/17/2015   Anxiety 01/28/2015   Clinical depression 01/28/2015  COPD with acute exacerbation ([place]) 11/26/2013   Vaginitis and vulvovaginitis 11/26/2013   Recurrent pneumonia 02/02/2013   Left arm pain 01/13/2013   Left arm pain 01/13/2013    Class: Acute   Dizziness and giddiness 01/13/2013   Acute bronchitis 12/07/2012   Rib pain 12/07/2012   Insomnia 08/26/2012   Diarrhea 06/19/2012   Heme positive stool 06/19/2012   Bloating 06/19/2012   Nausea alone 06/19/2012   Bladder retention 04/24/2012   Tremor 03/05/2012   Dysuria 03/05/2012   Failure to thrive in adult 06/21/2011   Airway hyperreactivity 03/08/2011   Back pain, chronic  03/08/2011   Chronic obstructive pulmonary disease ([place]) 03/08/2011   Acid reflux 03/08/2011   Cystitis 01/22/2011   Bilateral hand numbness 01/13/2011   WEIGHT LOSS 06/24/2010   RIB PAIN, LEFT SIDED 05/04/2010   ABDOMINAL PAIN, LEFT UPPER QUADRANT 05/04/2010   Unspecified otitis media 10/26/2009   BRONCHITIS, RECURRENT 08/23/2009   URI, ACUTE 08/04/2009   FATIGUE 07/06/2009   IBS 03/11/2009   ANAL FISSURE 03/11/2009   RECTAL BLEEDING 03/11/2009   Chronic interstitial cystitis 03/11/2009   COLONIC POLYPS, HX OF 03/11/2009   GERD 02/05/2009   SMOKER 12/04/2008   chronic asthma poorly controlled 12/04/2008   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   BENZODIAZEPINE ADDICTION 08/31/2007   Bipolar disorder ([place]) 08/31/2007   HYPERTENSION 08/31/2007   EMPHYSEMA 08/31/2007   NEPHROLITHIASIS 08/31/2007   ARTHRITIS 08/31/2007   FIBROMYALGIA 08/31/2007   SLEEP APNEA 08/31/2007    Past Surgical History:  Procedure Laterality Date   ABDOMINAL HYSTERECTOMY     BIOPSY  10/25/2018   Procedure: BIOPSY;  Surgeon: Rogene Houston, MD;  Location: [facility];  Service: Endoscopy;;  gastric   BIOPSY  06/11/2020   Procedure: BIOPSY;  Surgeon: Montez Morita, Quillian Quince, MD;  Location: [facility];  Service: Gastroenterology;;  small bowel   bladder stretching x6     BLADDER SURGERY     stimulator placed and stretching    CHOLECYSTECTOMY     COLONOSCOPY     COLONOSCOPY WITH PROPOFOL N/A 09/03/2014   Procedure: COLONOSCOPY WITH PROPOFOL;  Surgeon: Milus Banister, MD;  Location: [facility];  Service: Endoscopy;  Laterality: N/A;   ESOPHAGOGASTRODUODENOSCOPY (EGD) WITH PROPOFOL N/A 10/25/2018   Procedure: ESOPHAGOGASTRODUODENOSCOPY (EGD) WITH PROPOFOL;  Surgeon: Rogene Houston, MD;  Location: [facility];  Service: Endoscopy;  Laterality: N/A;  11:15   ESOPHAGOGASTRODUODENOSCOPY (EGD) WITH PROPOFOL N/A 06/11/2020   Procedure: ESOPHAGOGASTRODUODENOSCOPY (EGD) WITH PROPOFOL;  Surgeon: Harvel Quale, MD;  Location: [facility];  Service: Gastroenterology;  Laterality: N/A;   interstitial cystitis     IR GASTROSTOMY TUBE MOD SED  01/27/2021   PACEMAKER INSERTION     in hip for interstitial cystitis   pacemaker removal     removal of uterine cyst and scrapped uterus     replaced bladder pacemaker       OB History     Gravida  3   Para  2   Term      Preterm      AB  1   Living  2      SAB  1   IAB  0   Ectopic  0   Multiple  0   Live Births  2           Family History  Problem Relation Age of Onset   Heart disease Father    Asthma Maternal Grandmother    Emphysema  Maternal Grandfather    Cancer Maternal Grandfather        Lung Cancer   Cancer Other        Lung Cancer-Aunt   Colon cancer Neg Hx    Esophageal cancer Neg Hx    Rectal cancer Neg Hx    Stomach cancer Neg Hx    Thyroid disease Neg Hx     Social History   Tobacco Use   Smoking status: Every Day    Packs/day: 0.50    Years: 14.00    Pack years: 7.00    Types: Cigarettes   Smokeless tobacco: Never  Vaping Use   Vaping Use: Some days  Substance Use Topics   Alcohol use: Not Currently    Comment: last drink 3 weeks ago; recently released from rehab   Drug use: Not Currently    Types: Marijuana    Comment: once a month    Home Medications Prior to Admission medications   Medication Sig Start Date End Date Taking? Authorizing Provider  acetaminophen (TYLENOL) 500 MG tablet Take 1,000 mg by mouth every 6 (six) hours as needed for mild pain, fever or headache.    [provider]  albuterol (PROAIR HFA) 108 (90 Base) MCG/ACT inhaler INHALE 2 PUFFS EVERY 6 HOURS AS NEEDED FOR SHORTNESS OF BREATH AND WHEEZING. Patient taking differently: No sig reported 02/27/20   Carollee Herter, Alferd Apa, DO  clonazePAM (KLONOPIN) 0.5 MG tablet Take 1 tablet (0.5 mg total) by mouth 3 (three) times daily as needed for anxiety. 05/01/19   [name], DO   cyclobenzaprine (FLEXERIL) 5 MG tablet Take 1 tablet (5 mg total) by mouth 3 (three) times daily as needed for muscle spasms. 05/21/20   Mordecai Rasmussen, MD  estradiol (ESTRACE) 0.1 MG/GM vaginal cream Place 1 Applicatorful vaginally every 3 (three) days. 11/21/19   [provider]  estradiol (ESTRACE) 2 MG tablet Take 2 mg by mouth daily.    [provider]  fluticasone (FLONASE) 50 MCG/ACT nasal spray Place 2 sprays into both nostrils daily. Patient taking differently: Place 2 sprays into both nostrils daily as needed for allergies. 04/22/19   [name], DO  gabapentin (NEURONTIN) 800 MG tablet Take 0.5 tablets (400 mg total) by mouth in the morning, at noon, in the evening, and at bedtime. 02/01/21   Barb Merino, MD  HYDROcodone-acetaminophen (NORCO/VICODIN) 5-325 MG tablet Take 1 tablet by mouth 3 (three) times daily as needed. 02/01/21   Barb Merino, MD  hydrOXYzine (ATARAX/VISTARIL) 50 MG tablet Take 50 mg by mouth at bedtime. 08/26/18   [provider]  Hyoscyamine Sulfate SL 0.125 MG SUBL Take 0.125 mg by mouth every 4 (four) hours as needed (for cramping). 11/04/19   [provider]  mirtazapine (REMERON) 15 MG tablet Take 15 mg by mouth at bedtime. 11/24/20   [provider]  omeprazole (PRILOSEC) 40 MG capsule TAKE 1 CAPSULE BY MOUTH ONCE DAILY. Patient taking differently: Take 40 mg by mouth daily. 06/22/20   Roma Schanz R, DO  ondansetron (ZOFRAN) 4 MG tablet Take 1 tablet (4 mg total) by mouth every 8 (eight) hours as needed for nausea or vomiting. 02/09/21   Marcello Fennel, PA-C  ondansetron (ZOFRAN-ODT) 8 MG disintegrating tablet Take 1 tablet (8 mg total) by mouth every 8 (eight) hours as needed for nausea or vomiting. 68/12/75   Delora Fuel, MD  oxybutynin (DITROPAN XL) 15 MG 24 hr tablet Take 15 mg  by mouth daily. 09/16/19   [provider]  promethazine (PHENERGAN) 25 MG tablet TAKE (1) TABLET EVERY SIX  HOURS AS NEEDED FOR NAUSEA AND VOMITING. Patient taking differently: Take 25 mg by mouth every 6 (six) hours as needed for nausea or vomiting. 11/08/20   Roma Schanz R, DO  QUEtiapine (SEROQUEL) 400 MG tablet Take 400 mg by mouth at bedtime.  09/16/18   [provider]  RESTASIS 0.05 % ophthalmic emulsion Place 1 drop into both eyes 2 (two) times daily. 02/26/20   [provider]  SYMBICORT 80-4.5 MCG/ACT inhaler INHALE 2 PUFFS INTO THE LUNGS TWICE DAILY. Patient taking differently: Inhale 2 puffs into the lungs in the morning and at bedtime. 12/28/20   Saguier, Percell Miller, PA-C    Allergies    Abilify [aripiprazole], Amitriptyline, Metoclopramide hcl, Propoxyphene, Toradol [ketorolac tromethamine], Tramadol, Ambien [zolpidem tartrate], Eszopiclone, Varenicline, Buprenorphine hcl, Demerol [meperidine], Emetrol, and Morphine and related  Review of Systems   Review of Systems  Constitutional:  Negative for chills and fever.  HENT:  Negative for ear pain and sore throat.   Eyes:  Negative for pain and visual disturbance.  Respiratory:  Negative for cough and shortness of breath.   Cardiovascular:  Negative for chest pain and palpitations.  Gastrointestinal:  Positive for abdominal pain. Negative for vomiting.  Genitourinary:  Negative for dysuria and hematuria.  Musculoskeletal:  Negative for arthralgias and back pain.  Skin:  Negative for color change and rash.  Neurological:  Negative for seizures and syncope.  Psychiatric/Behavioral:  Positive for agitation. The patient is nervous/anxious.   All other systems reviewed and are negative.  Physical Exam Updated Vital Signs BP (!) 151/105 (BP Location: Left Arm)   Pulse (!) 131   Temp 98.8 F (37.1 C) (Oral)   Resp (!) 36   Ht 1.626 m (5\' 4" )   Wt 42.6 kg   SpO2 99%   BMI 16.14 kg/m   Physical Exam Vitals and nursing note reviewed.  Constitutional:      General: She is not in acute distress.    Appearance:  Normal appearance. She is well-developed.  HENT:     Head: Normocephalic and atraumatic.     Mouth/Throat:     Mouth: Mucous membranes are moist.  Eyes:     Extraocular Movements: Extraocular movements intact.     Conjunctiva/sclera: Conjunctivae normal.     Pupils: Pupils are equal, round, and reactive to light.  Cardiovascular:     Rate and Rhythm: Normal rate and regular rhythm.     Heart sounds: No murmur heard. Pulmonary:     Effort: Pulmonary effort is normal. No respiratory distress.     Breath sounds: Normal breath sounds.  Abdominal:     General: Abdomen is flat.     Palpations: Abdomen is soft.     Tenderness: There is no abdominal tenderness.     Comments: G-tube in place left side of the abdomen.  No drainage or discharge.  Musculoskeletal:        General: No swelling. Normal range of motion.     Cervical back: Normal range of motion and neck supple.  Skin:    General: Skin is warm and dry.  Neurological:     General: No focal deficit present.     Mental Status: She is alert and oriented to person, place, and time.     Cranial Nerves: No cranial nerve deficit.     Sensory: No sensory deficit.  ED Results / Procedures / Treatments   Labs (all labs ordered are listed, but only abnormal results are displayed) Labs Reviewed - No data to display  EKG None  Radiology No results found.  Procedures Procedures   Medications Ordered in ED Medications  HYDROmorphone (DILAUDID) injection 1 mg (has no administration in time range)    ED Course  I have reviewed the triage vital signs and the nursing notes.  Pertinent labs & imaging results that were available during my care of the patient were reviewed by me and considered in my medical decision making (see chart for details).    MDM Rules/Calculators/A&P                           Patient improved here significantly with hydromorphone and Ativan.  Patient ended up getting a total of 3 mg of Ativan and 3 mg  of hydromorphone.  Abdomen soft and nontender.  Patient's G-tube flushes appropriately.  There is no leakage.  Patient's labs complete metabolic panel without any significant abnormalities glucose is 157.  Lipase is normal.  BC significant for leukocytosis 12,000.  Hemoglobin is 12.3.  Patient's EKG showed a sinus tachycardia.  This all improved significantly with the pain medicine and the Ativan.  We also did bladder scan.  Patient had no urinary retention.  Patient stable for discharge home. Final Clinical Impression(s) / ED Diagnoses Final diagnoses:  None    Rx / DC Orders ED Discharge Orders     None        Fredia Sorrow, MD 02/12/21 [phone]

## 2021-02-11 NOTE — ED Triage Notes (Signed)
Pt BIB family for ABD pain s/p bladder SX in the last day

## 2021-02-12 ENCOUNTER — Other Ambulatory Visit: Payer: Self-pay | Admitting: Family Medicine

## 2021-02-12 DIAGNOSIS — E43 Unspecified severe protein-calorie malnutrition: Secondary | ICD-10-CM | POA: Diagnosis not present

## 2021-02-12 DIAGNOSIS — I10 Essential (primary) hypertension: Secondary | ICD-10-CM | POA: Diagnosis not present

## 2021-02-12 DIAGNOSIS — Z431 Encounter for attention to gastrostomy: Secondary | ICD-10-CM | POA: Diagnosis not present

## 2021-02-12 DIAGNOSIS — F319 Bipolar disorder, unspecified: Secondary | ICD-10-CM | POA: Diagnosis not present

## 2021-02-12 DIAGNOSIS — G894 Chronic pain syndrome: Secondary | ICD-10-CM | POA: Diagnosis not present

## 2021-02-12 DIAGNOSIS — M1990 Unspecified osteoarthritis, unspecified site: Secondary | ICD-10-CM | POA: Diagnosis not present

## 2021-02-12 DIAGNOSIS — D649 Anemia, unspecified: Secondary | ICD-10-CM | POA: Diagnosis not present

## 2021-02-12 DIAGNOSIS — J449 Chronic obstructive pulmonary disease, unspecified: Secondary | ICD-10-CM | POA: Diagnosis not present

## 2021-02-12 DIAGNOSIS — F419 Anxiety disorder, unspecified: Secondary | ICD-10-CM | POA: Diagnosis not present

## 2021-02-12 DIAGNOSIS — R1310 Dysphagia, unspecified: Secondary | ICD-10-CM | POA: Diagnosis not present

## 2021-02-12 DIAGNOSIS — R110 Nausea: Secondary | ICD-10-CM

## 2021-02-12 DIAGNOSIS — E785 Hyperlipidemia, unspecified: Secondary | ICD-10-CM | POA: Diagnosis not present

## 2021-02-12 DIAGNOSIS — K589 Irritable bowel syndrome without diarrhea: Secondary | ICD-10-CM | POA: Diagnosis not present

## 2021-02-12 DIAGNOSIS — K219 Gastro-esophageal reflux disease without esophagitis: Secondary | ICD-10-CM | POA: Diagnosis not present

## 2021-02-12 DIAGNOSIS — N3010 Interstitial cystitis (chronic) without hematuria: Secondary | ICD-10-CM | POA: Diagnosis not present

## 2021-02-12 DIAGNOSIS — M797 Fibromyalgia: Secondary | ICD-10-CM | POA: Diagnosis not present

## 2021-02-12 DIAGNOSIS — D75839 Thrombocytosis, unspecified: Secondary | ICD-10-CM | POA: Diagnosis not present

## 2021-02-12 NOTE — Discharge Instructions (Signed)
G-tube seems to be working okay.  Resume your medications you have at home.  Follow-up with your home nurse.  Follow-up with your doctors.

## 2021-02-12 NOTE — ED Notes (Signed)
Patient verbalizes understanding of discharge instructions. Opportunity for questioning and answers were provided. Armband removed by staff, pt discharged from ED. Wheeled out to lobby  

## 2021-02-14 ENCOUNTER — Other Ambulatory Visit: Payer: Self-pay

## 2021-02-14 ENCOUNTER — Emergency Department (HOSPITAL_COMMUNITY)
Admission: EM | Admit: 2021-02-14 | Discharge: 2021-02-15 | Disposition: A | Discharge status: Home / Self Care | Payer: BC Managed Care – PPO | Attending: Emergency Medicine | Admitting: Emergency Medicine

## 2021-02-14 DIAGNOSIS — I10 Essential (primary) hypertension: Secondary | ICD-10-CM | POA: Insufficient documentation

## 2021-02-14 DIAGNOSIS — J45909 Unspecified asthma, uncomplicated: Secondary | ICD-10-CM | POA: Insufficient documentation

## 2021-02-14 DIAGNOSIS — J449 Chronic obstructive pulmonary disease, unspecified: Secondary | ICD-10-CM | POA: Insufficient documentation

## 2021-02-14 DIAGNOSIS — Z79899 Other long term (current) drug therapy: Secondary | ICD-10-CM | POA: Diagnosis not present

## 2021-02-14 DIAGNOSIS — Z85828 Personal history of other malignant neoplasm of skin: Secondary | ICD-10-CM | POA: Diagnosis not present

## 2021-02-14 DIAGNOSIS — K9423 Gastrostomy malfunction: Secondary | ICD-10-CM | POA: Insufficient documentation

## 2021-02-14 DIAGNOSIS — F17210 Nicotine dependence, cigarettes, uncomplicated: Secondary | ICD-10-CM | POA: Insufficient documentation

## 2021-02-14 DIAGNOSIS — Z950 Presence of cardiac pacemaker: Secondary | ICD-10-CM | POA: Diagnosis not present

## 2021-02-14 LAB — CBC WITH DIFFERENTIAL/PLATELET
Abs Immature Granulocytes: 0.04 10*3/uL (ref 0.00–0.07)
Basophils Absolute: 0.1 10*3/uL (ref 0.0–0.1)
Basophils Relative: 1 %
Eosinophils Absolute: 0.4 10*3/uL (ref 0.0–0.5)
Eosinophils Relative: 4 %
HCT: 38 % (ref 36.0–46.0)
Hemoglobin: 12.4 g/dL (ref 12.0–15.0)
Immature Granulocytes: 0 %
Lymphocytes Relative: 45 %
Lymphs Abs: 4.8 10*3/uL — ABNORMAL HIGH (ref 0.7–4.0)
MCH: 31 pg (ref 26.0–34.0)
MCHC: 32.6 g/dL (ref 30.0–36.0)
MCV: 95 fL (ref 80.0–100.0)
Monocytes Absolute: 0.6 10*3/uL (ref 0.1–1.0)
Monocytes Relative: 5 %
Neutro Abs: 4.8 10*3/uL (ref 1.7–7.7)
Neutrophils Relative %: 45 %
Platelets: 493 10*3/uL — ABNORMAL HIGH (ref 150–400)
RBC: 4 MIL/uL (ref 3.87–5.11)
RDW: 12.8 % (ref 11.5–15.5)
WBC: 10.7 10*3/uL — ABNORMAL HIGH (ref 4.0–10.5)
nRBC: 0 % (ref 0.0–0.2)

## 2021-02-14 LAB — COMPREHENSIVE METABOLIC PANEL
ALT: 13 U/L (ref 0–44)
AST: 20 U/L (ref 15–41)
Albumin: 4 g/dL (ref 3.5–5.0)
Alkaline Phosphatase: 64 U/L (ref 38–126)
Anion gap: 9 (ref 5–15)
BUN: 5 mg/dL — ABNORMAL LOW (ref 6–20)
CO2: 25 mmol/L (ref 22–32)
Calcium: 9.5 mg/dL (ref 8.9–10.3)
Chloride: 105 mmol/L (ref 98–111)
Creatinine, Ser: 0.69 mg/dL (ref 0.44–1.00)
GFR, Estimated: >60 mL/min (ref >60)
Glucose, Bld: 104 mg/dL — ABNORMAL HIGH (ref 70–99)
Potassium: 3.5 mmol/L (ref 3.5–5.1)
Sodium: 139 mmol/L (ref 135–145)
Total Bilirubin: 0.6 mg/dL (ref 0.3–1.2)
Total Protein: 7.3 g/dL (ref 6.5–8.1)

## 2021-02-14 LAB — CULTURE, BLOOD (ROUTINE X 2)
Culture: NO GROWTH
Culture: NO GROWTH
Special Requests: ADEQUATE
Special Requests: ADEQUATE

## 2021-02-14 LAB — LIPASE, BLOOD: Lipase: 26 U/L (ref 11–51)

## 2021-02-14 MED ORDER — SODIUM CHLORIDE 0.9 % IV BOLUS: 1000.0000 mL | Freq: Once | INTRAVENOUS | Status: DC

## 2021-02-14 NOTE — ED Provider Notes (Signed)
Emergency Medicine Provider Triage Evaluation Note  [name] , a 45 y.o. female  was evaluated in triage.  Pt complains of G-tube problem over the last week and a half.  She states that she has a hole in her G-tube and everything she places in leaks out.  She has been having decreased p.o. intake and the been having increased anxiety symptoms and nausea.  No fever or chills.  Review of Systems  Positive:  Negative:   Physical Exam  BP 134/69 (BP Location: Right Arm)   Pulse 95   Temp 98.6 F (37 C)   Resp 19   SpO2 100%  Gen:   Awake, no distress   Resp:  Normal effort  MSK:   Moves extremities without difficulty  Other:  G-tube in place.  Surrounding area appears normal.  Medical Decision Making  Medically screening exam initiated at 3:33 PM.  Appropriate orders placed.  [name] was informed that the remainder of the evaluation will be completed by another provider, this initial triage assessment does not replace that evaluation, and the importance of remaining in the ED until their evaluation is complete.     Myna Bright Kingsley, PA-C 02/14/21 1534    Daleen Bo, MD 02/14/21 2056

## 2021-02-14 NOTE — ED Triage Notes (Signed)
Pt reports two weeks of nausea, abdominal pain, and g tube leaking. Reports she has home health nurses who have done troubleshooting at home without relief. Unable to take any medications, or tolerate any PO intake.

## 2021-02-15 MED ORDER — PROMETHAZINE HCL 25 MG PO TABS
25.0000 mg | ORAL_TABLET | Freq: Once | ORAL | Status: AC
  Administered 2021-02-15: 25 mg via ORAL
  Filled 2021-02-15: qty 1

## 2021-02-15 MED ORDER — GABAPENTIN 300 MG PO CAPS
400.0000 mg | ORAL_CAPSULE | Freq: Once | ORAL | Status: AC
  Administered 2021-02-15: 400 mg via ORAL
  Filled 2021-02-15: qty 1

## 2021-02-15 MED ORDER — HYDROCODONE-ACETAMINOPHEN 5-325 MG PO TABS
1.0000 | ORAL_TABLET | Freq: Once | ORAL | Status: AC
  Administered 2021-02-15: 1 via ORAL
  Filled 2021-02-15: qty 1

## 2021-02-15 NOTE — Discharge Instructions (Signed)
Follow up with your doctor as scheduled. Continue your regular medications and feeding schedule.

## 2021-02-15 NOTE — ED Notes (Signed)
RN to PA [facility] about plan of care concerning fluid bolus and HCG Beta stat lab. Per provider, those orders are to be discontinued and not acted upon at this time.

## 2021-02-15 NOTE — ED Provider Notes (Signed)
[facility]ERGENC[facility] DEPARTMENT Provider Note   CSN: [id number] Arrival date & time: 02/14/21  1356     History No chief complaint on file.   [name] is a 45 y.o. female.  Patient to ED with complaint that her PEG tube is leaking and she is unable to get her nutrition through it. Patient has a complicated medical history including substance abuse, anxiety, Bipolar, failure to thrive, fibromyalgia, COPD, interstitial cystitis, IBS. PEG placed last month for failure to thrive and weight loss. She has had studies that show normal motility and negative EGD. She takes medications orally.   The history is provided by the patient and a parent. No language interpreter was used.      Past Medical History:  Diagnosis Date   Anal fissure 03/11/2009   Anemia    Anxiety and depression    ARTHRITIS 08/31/2007   Asthma    BENZODIAZEPINE ADDICTION 08/31/2007   Bipolar 1 disorder (HCC)    Bowel obstruction (HCC)    Breakdown of urinary electronic stimulator device, init (HCC)    BRONCHITIS, RECURRENT 08/23/2009   Asthmatic Bronchitis-Dr. Melvyn Novas.....-HFA 75% 12/04/2008>75% 02/05/2009>75% 08/04/2009 -PFT's 01/04/2009 2.56 (86%) ratio 75, no resp to B2 and DLC0 67% > 80 after correction    Cancer (HCC)    cervical cancer   Chronic interstitial cystitis 03/11/2009   Chronic nausea    Chronic pain    COLONIC POLYPS, HX OF 07/25/2006   ADENOMATOUS POLYP   COPD (chronic obstructive pulmonary disease) (HCC)    Endometriosis    FIBROMYALGIA 08/31/2007   GERD 02/05/2009   Hyperlipidemia    HYPERTENSION 08/31/2007   IBS 03/11/2009   Internal hemorrhoids    Migraine headache    NEPHROLITHIASIS 08/31/2007   Pancreatitis    PONV (postoperative nausea and vomiting)    RECTAL BLEEDING 03/11/2009   Sciatica    right leg   Seizures ([place])    been about 1 year since last seisure per pt   SLEEP APNEA 08/31/2007   Substance abuse ([place])    Thyroid disease    Uterine cyst      Patient Active Problem List   Diagnosis Date Noted   Dysphagia    UTI (urinary tract infection) 01/21/2021   Thrombocytosis 01/21/2021   Elevated lipase 01/21/2021   Hypokalemia 01/21/2021   Fibromyalgia 01/21/2021   History of pancreatitis 01/21/2021   Nausea without vomiting 05/24/2020   Abdominal pain 05/24/2020   Severe protein-calorie malnutrition ([place]) 05/24/2020   Encounter by telehealth for suspected COVID-19 03/20/2019   Abdominal pain, epigastric 10/07/2018   Intractable vomiting 08/09/2018   Compression fracture of fifth lumbar vertebra ([place]) 02/07/2018   Altered mental status 02/07/2018   Generalized weakness 01/24/2018   MDD (major depressive disorder) 04/23/2017   Substance abuse ([place]) 04/19/2017   Hyperthyroidism 02/17/2015   Anxiety 01/28/2015   Clinical depression 01/28/2015   COPD with acute exacerbation ([place]) 11/26/2013   Vaginitis and vulvovaginitis 11/26/2013   Recurrent pneumonia 02/02/2013   Left arm pain 01/13/2013   Left arm pain 01/13/2013    Class: Acute   Dizziness and giddiness 01/13/2013   Acute bronchitis 12/07/2012   Rib pain 12/07/2012   Insomnia 08/26/2012   Diarrhea 06/19/2012   Heme positive stool 06/19/2012   Bloating 06/19/2012   Nausea alone 06/19/2012   Bladder retention 04/24/2012   Tremor 03/05/2012   Dysuria 03/05/2012   Failure to thrive in adult 06/21/2011   Airway hyperreactivity 03/08/2011   Back pain,  chronic 03/08/2011   Chronic obstructive pulmonary disease ([place]) 03/08/2011   Acid reflux 03/08/2011   Cystitis 01/22/2011   Bilateral hand numbness 01/13/2011   WEIGHT LOSS 06/24/2010   RIB PAIN, LEFT SIDED 05/04/2010   ABDOMINAL PAIN, LEFT UPPER QUADRANT 05/04/2010   Unspecified otitis media 10/26/2009   BRONCHITIS, RECURRENT 08/23/2009   URI, ACUTE 08/04/2009   FATIGUE 07/06/2009   IBS 03/11/2009   ANAL FISSURE 03/11/2009   RECTAL BLEEDING 03/11/2009   Chronic interstitial cystitis 03/11/2009   COLONIC  POLYPS, HX OF 03/11/2009   GERD 02/05/2009   SMOKER 12/04/2008   chronic asthma poorly controlled 12/04/2008   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   BENZODIAZEPINE ADDICTION 08/31/2007   Bipolar disorder ([place]) 08/31/2007   HYPERTENSION 08/31/2007   EMPHYSEMA 08/31/2007   NEPHROLITHIASIS 08/31/2007   ARTHRITIS 08/31/2007   FIBROMYALGIA 08/31/2007   SLEEP APNEA 08/31/2007    Past Surgical History:  Procedure Laterality Date   ABDOMINAL HYSTERECTOMY     BIOPSY  10/25/2018   Procedure: BIOPSY;  Surgeon: Rogene Houston, MD;  [facility]: [facility];  Service: Endoscopy;;  gastric   BIOPSY  06/11/2020   Procedure: BIOPSY;  Surgeon: Montez Morita, Quillian Quince, MD;  Location: [facility];  Service: Gastroenterology;;  small bowel   bladder stretching x6     BLADDER SURGERY     stimulator placed and stretching    CHOLECYSTECTOMY     COLONOSCOPY     COLONOSCOPY WITH PROPOFOL N/A 09/03/2014   Procedure: COLONOSCOPY WITH PROPOFOL;  Surgeon: Milus Banister, MD;  Location: [facility];  Service: Endoscopy;  Laterality: N/A;   ESOPHAGOGASTRODUODENOSCOPY (EGD) WITH PROPOFOL N/A 10/25/2018   Procedure: ESOPHAGOGASTRODUODENOSCOPY (EGD) WITH PROPOFOL;  Surgeon: Rogene Houston, MD;  [facility];  Service: Endoscopy;  Laterality: N/A;  11:15   ESOPHAGOGASTRODUODENOSCOPY (EGD) WITH PROPOFOL N/A 06/11/2020   Procedure: ESOPHAGOGASTRODUODENOSCOPY (EGD) WITH PROPOFOL;  Surgeon: Harvel Quale, MD;  Location: [facility];  Service: Gastroenterology;  Laterality: N/A;   interstitial cystitis     IR GASTROSTOMY TUBE MOD SED  01/27/2021   PACEMAKER INSERTION     in hip for interstitial cystitis   pacemaker removal     removal of uterine cyst and scrapped uterus     replaced bladder pacemaker       OB History     Gravida  3   Para  2   Term      Preterm      AB  1   Living  2      SAB  1   IAB  0   Ectopic  0   Multiple  0   Live Births  2            Family History  Problem Relation Age of Onset   Heart disease Father    Asthma Maternal Grandmother    Emphysema Maternal Grandfather    Cancer Maternal Grandfather        Lung Cancer   Cancer Other        Lung Cancer-Aunt   Colon cancer Neg Hx    Esophageal cancer Neg Hx    Rectal cancer Neg Hx    Stomach cancer Neg Hx    Thyroid disease Neg Hx     Social History   Tobacco Use   Smoking status: Every Day    Packs/day: 0.50    Years: 14.00    Pack years: 7.00    Types: Cigarettes  Smokeless tobacco: Never  Vaping Use   Vaping Use: Some days  Substance Use Topics   Alcohol use: Not Currently    Comment: last drink 3 weeks ago; recently released from rehab   Drug use: Not Currently    Types: Marijuana    Comment: once a month    Home Medications Prior to Admission medications   Medication Sig Start Date End Date Taking? Authorizing Provider  acetaminophen (TYLENOL) 500 MG tablet Take 1,000 mg by mouth every 6 (six) hours as needed for mild pain, fever or headache.    [provider]  albuterol (PROAIR HFA) 108 (90 Base) MCG/ACT inhaler INHALE 2 PUFFS EVERY 6 HOURS AS NEEDED FOR SHORTNESS OF BREATH AND WHEEZING. Patient taking differently: No sig reported 02/27/20   Carollee Herter, Alferd Apa, DO  clonazePAM (KLONOPIN) 0.5 MG tablet Take 1 tablet (0.5 mg total) by mouth 3 (three) times daily as needed for anxiety. 05/01/19   [name], DO  cyclobenzaprine (FLEXERIL) 5 MG tablet Take 1 tablet (5 mg total) by mouth 3 (three) times daily as needed for muscle spasms. 05/21/20   Mordecai Rasmussen, MD  estradiol (ESTRACE) 0.1 MG/GM vaginal cream Place 1 Applicatorful vaginally every 3 (three) days. 11/21/19   [provider]  estradiol (ESTRACE) 2 MG tablet Take 2 mg by mouth daily.    [provider]  fluticasone (FLONASE) 50 MCG/ACT nasal spray Place 2 sprays into both nostrils daily. Patient taking differently: Place 2 sprays into both  nostrils daily as needed for allergies. 04/22/19   [name], DO  gabapentin (NEURONTIN) 800 MG tablet Take 0.5 tablets (400 mg total) by mouth in the morning, at noon, in the evening, and at bedtime. 02/01/21   Barb Merino, MD  HYDROcodone-acetaminophen (NORCO/VICODIN) 5-325 MG tablet Take 1 tablet by mouth 3 (three) times daily as needed. 02/01/21   Barb Merino, MD  hydrOXYzine (ATARAX/VISTARIL) 50 MG tablet Take 50 mg by mouth at bedtime. 08/26/18   [provider]  Hyoscyamine Sulfate SL 0.125 MG SUBL Take 0.125 mg by mouth every 4 (four) hours as needed (for cramping). 11/04/19   [provider]  mirtazapine (REMERON) 15 MG tablet Take 15 mg by mouth at bedtime. 11/24/20   [provider]  omeprazole (PRILOSEC) 40 MG capsule TAKE 1 CAPSULE BY MOUTH ONCE DAILY. Patient taking differently: Take 40 mg by mouth daily. 06/22/20   Roma Schanz R, DO  ondansetron (ZOFRAN) 4 MG tablet Take 1 tablet (4 mg total) by mouth every 8 (eight) hours as needed for nausea or vomiting. 02/09/21   Marcello Fennel, PA-C  ondansetron (ZOFRAN-ODT) 8 MG disintegrating tablet Take 1 tablet (8 mg total) by mouth every 8 (eight) hours as needed for nausea or vomiting. 50/09/38   Delora Fuel, MD  oxybutynin (DITROPAN XL) 15 MG 24 hr tablet Take 15 mg by mouth daily. 09/16/19   [provider]  promethazine (PHENERGAN) 25 MG tablet TAKE (1) TABLET EVERY SIX HOURS AS NEEDED FOR NAUSEA AND VOMITING. 02/14/21   Carollee Herter, Alferd Apa, DO  QUEtiapine (SEROQUEL) 400 MG tablet Take 400 mg by mouth at bedtime.  09/16/18   [provider]  RESTASIS 0.05 % ophthalmic emulsion Place 1 drop into both eyes 2 (two) times daily. 02/26/20   [provider]  SYMBICORT 80-4.5 MCG/ACT inhaler INHALE 2 PUFFS INTO THE LUNGS TWICE DAILY. Patient taking differently: Inhale 2 puffs into the lungs in the morning and  at bedtime. 12/28/20   Saguier, Percell Miller, PA-C    Allergies     Abilify [aripiprazole], Amitriptyline, Metoclopramide hcl, Propoxyphene, Toradol [ketorolac tromethamine], Tramadol, Ambien [zolpidem tartrate], Eszopiclone, Varenicline, Buprenorphine hcl, Demerol [meperidine], Emetrol, and Morphine and related  Review of Systems   Review of Systems  Constitutional:  Negative for chills and fever.  HENT: Negative.    Respiratory: Negative.    Cardiovascular: Negative.   Gastrointestinal: Negative.        C/o PEG tube dysfunction  Musculoskeletal: Negative.   Skin: Negative.   Neurological: Negative.    Physical Exam Updated Vital Signs BP 114/77   Pulse 78   Temp 98.6 F (37 C)   Resp 18   SpO2 98%   Physical Exam Vitals and nursing note reviewed.  Constitutional:      Comments: Restless, anxious, cachectic  Cardiovascular:     Rate and Rhythm: Normal rate.  Pulmonary:     Effort: Pulmonary effort is normal.  Abdominal:     Palpations: Abdomen is soft.     Comments: PEG site unremarkable. No drainage at insertion.   Musculoskeletal:        General: Normal range of motion.  Skin:    General: Skin is warm and dry.  Neurological:     Mental Status: She is oriented to person, place, and time.    ED Results / Procedures / Treatments   Labs (all labs ordered are listed, but only abnormal results are displayed) Labs Reviewed  CBC WITH DIFFERENTIAL/PLATELET - Abnormal; Notable for the following components:      Result Value   WBC 10.7 (*)    Platelets 493 (*)    Lymphs Abs 4.8 (*)    All other components within normal limits  COMPREHENSIVE METABOLIC PANEL - Abnormal; Notable for the following components:   Glucose, Bld 104 (*)    BUN 5 (*)    All other components within normal limits  LIPASE, BLOOD  I-STAT BETA HCG BLOOD, ED ([facility], [facility], [facility] ONLY)    EKG None  Radiology No results found.  Procedures Procedures   Medications Ordered in ED Medications  sodium chloride 0.9 % bolus 1,000 mL (has no administration in time  range)  gabapentin (NEURONTIN) capsule 400 mg (has no administration in time range)  HYDROcodone-acetaminophen (NORCO/VICODIN) 5-325 MG per tablet 1 tablet (has no administration in time range)  promethazine (PHENERGAN) tablet 25 mg (has no administration in time range)    ED Course  I have reviewed the triage vital signs and the nursing notes.  Pertinent labs & imaging results that were available during my care of the patient were reviewed by me and considered in my medical decision making (see chart for details).    MDM Rules/Calculators/A&P                           Patient to ED with c/o leaking PEG tube, used for nutritional support with dx failure to thrive.   Oral medications provided with relief of anxiety and pain. PEG port, stop-cock replaced.   Ok to discharge home.   Final Clinical Impression(s) / ED Diagnoses Final diagnoses:  None   PEG tube malfunction  Rx / DC Orders ED Discharge Orders     None        Charlann Lange, PA-C 02/15/21 0346    Dina Rich Barbette Hair, MD 02/16/21 [phone]

## 2021-02-15 NOTE — ED Notes (Signed)
Pt verbalized understanding of discharge instructions; opportunity for questions provided °

## 2021-02-15 NOTE — ED Notes (Signed)
Patient reports "it feels like my chest is very tight and I cannot breathe". O2 is at 100 and additional vitals are within normal limits.

## 2021-02-16 DIAGNOSIS — N189 Chronic kidney disease, unspecified: Secondary | ICD-10-CM | POA: Diagnosis not present

## 2021-02-17 DIAGNOSIS — E43 Unspecified severe protein-calorie malnutrition: Secondary | ICD-10-CM | POA: Diagnosis not present

## 2021-02-17 DIAGNOSIS — J449 Chronic obstructive pulmonary disease, unspecified: Secondary | ICD-10-CM | POA: Diagnosis not present

## 2021-02-17 DIAGNOSIS — D75839 Thrombocytosis, unspecified: Secondary | ICD-10-CM | POA: Diagnosis not present

## 2021-02-17 DIAGNOSIS — M1990 Unspecified osteoarthritis, unspecified site: Secondary | ICD-10-CM | POA: Diagnosis not present

## 2021-02-17 DIAGNOSIS — F419 Anxiety disorder, unspecified: Secondary | ICD-10-CM | POA: Diagnosis not present

## 2021-02-17 DIAGNOSIS — M797 Fibromyalgia: Secondary | ICD-10-CM | POA: Diagnosis not present

## 2021-02-17 DIAGNOSIS — R1310 Dysphagia, unspecified: Secondary | ICD-10-CM | POA: Diagnosis not present

## 2021-02-17 DIAGNOSIS — K589 Irritable bowel syndrome without diarrhea: Secondary | ICD-10-CM | POA: Diagnosis not present

## 2021-02-17 DIAGNOSIS — Z431 Encounter for attention to gastrostomy: Secondary | ICD-10-CM | POA: Diagnosis not present

## 2021-02-17 DIAGNOSIS — E785 Hyperlipidemia, unspecified: Secondary | ICD-10-CM | POA: Diagnosis not present

## 2021-02-17 DIAGNOSIS — D649 Anemia, unspecified: Secondary | ICD-10-CM | POA: Diagnosis not present

## 2021-02-17 DIAGNOSIS — K219 Gastro-esophageal reflux disease without esophagitis: Secondary | ICD-10-CM | POA: Diagnosis not present

## 2021-02-17 DIAGNOSIS — F319 Bipolar disorder, unspecified: Secondary | ICD-10-CM | POA: Diagnosis not present

## 2021-02-17 DIAGNOSIS — N3010 Interstitial cystitis (chronic) without hematuria: Secondary | ICD-10-CM | POA: Diagnosis not present

## 2021-02-17 DIAGNOSIS — I10 Essential (primary) hypertension: Secondary | ICD-10-CM | POA: Diagnosis not present

## 2021-02-17 DIAGNOSIS — G894 Chronic pain syndrome: Secondary | ICD-10-CM | POA: Diagnosis not present

## 2021-02-21 DIAGNOSIS — F314 Bipolar disorder, current episode depressed, severe, without psychotic features: Secondary | ICD-10-CM | POA: Diagnosis not present

## 2021-02-21 DIAGNOSIS — F5101 Primary insomnia: Secondary | ICD-10-CM | POA: Diagnosis not present

## 2021-02-21 DIAGNOSIS — F411 Generalized anxiety disorder: Secondary | ICD-10-CM | POA: Diagnosis not present

## 2021-02-24 ENCOUNTER — Ambulatory Visit (INDEPENDENT_AMBULATORY_CARE_PROVIDER_SITE_OTHER): Payer: BC Managed Care – PPO | Admitting: Gastroenterology

## 2021-02-24 DIAGNOSIS — H527 Unspecified disorder of refraction: Secondary | ICD-10-CM | POA: Diagnosis not present

## 2021-02-24 DIAGNOSIS — H02403 Unspecified ptosis of bilateral eyelids: Secondary | ICD-10-CM | POA: Diagnosis not present

## 2021-02-24 DIAGNOSIS — H539 Unspecified visual disturbance: Secondary | ICD-10-CM | POA: Diagnosis not present

## 2021-03-04 DIAGNOSIS — F17210 Nicotine dependence, cigarettes, uncomplicated: Secondary | ICD-10-CM | POA: Diagnosis not present

## 2021-03-04 DIAGNOSIS — G8929 Other chronic pain: Secondary | ICD-10-CM | POA: Diagnosis not present

## 2021-03-04 DIAGNOSIS — Z79899 Other long term (current) drug therapy: Secondary | ICD-10-CM | POA: Diagnosis not present

## 2021-03-04 DIAGNOSIS — Z681 Body mass index (BMI) 19 or less, adult: Secondary | ICD-10-CM | POA: Diagnosis not present

## 2021-03-04 DIAGNOSIS — M5441 Lumbago with sciatica, right side: Secondary | ICD-10-CM | POA: Diagnosis not present

## 2021-03-07 DIAGNOSIS — E46 Unspecified protein-calorie malnutrition: Secondary | ICD-10-CM | POA: Diagnosis not present

## 2021-03-09 DIAGNOSIS — Z681 Body mass index (BMI) 19 or less, adult: Secondary | ICD-10-CM | POA: Diagnosis not present

## 2021-03-09 DIAGNOSIS — E46 Unspecified protein-calorie malnutrition: Secondary | ICD-10-CM | POA: Diagnosis not present

## 2021-03-22 ENCOUNTER — Ambulatory Visit (INDEPENDENT_AMBULATORY_CARE_PROVIDER_SITE_OTHER): Payer: BC Managed Care – PPO | Admitting: Gastroenterology

## 2021-03-22 ENCOUNTER — Emergency Department (HOSPITAL_COMMUNITY)
Admission: EM | Admit: 2021-03-22 | Discharge: 2021-03-22 | Disposition: A | Discharge status: Home / Self Care | Payer: BC Managed Care – PPO | Attending: Emergency Medicine | Admitting: Emergency Medicine

## 2021-03-22 ENCOUNTER — Encounter (INDEPENDENT_AMBULATORY_CARE_PROVIDER_SITE_OTHER): Payer: Self-pay | Admitting: Gastroenterology

## 2021-03-22 ENCOUNTER — Other Ambulatory Visit: Payer: Self-pay

## 2021-03-22 ENCOUNTER — Encounter (HOSPITAL_COMMUNITY): Payer: Self-pay | Admitting: *Deleted

## 2021-03-22 ENCOUNTER — Other Ambulatory Visit: Payer: Self-pay | Admitting: Family Medicine

## 2021-03-22 DIAGNOSIS — M79606 Pain in leg, unspecified: Secondary | ICD-10-CM | POA: Insufficient documentation

## 2021-03-22 DIAGNOSIS — F17210 Nicotine dependence, cigarettes, uncomplicated: Secondary | ICD-10-CM | POA: Insufficient documentation

## 2021-03-22 DIAGNOSIS — I10 Essential (primary) hypertension: Secondary | ICD-10-CM | POA: Insufficient documentation

## 2021-03-22 DIAGNOSIS — Z8541 Personal history of malignant neoplasm of cervix uteri: Secondary | ICD-10-CM | POA: Diagnosis not present

## 2021-03-22 DIAGNOSIS — J45909 Unspecified asthma, uncomplicated: Secondary | ICD-10-CM | POA: Insufficient documentation

## 2021-03-22 DIAGNOSIS — J449 Chronic obstructive pulmonary disease, unspecified: Secondary | ICD-10-CM | POA: Diagnosis not present

## 2021-03-22 DIAGNOSIS — Z7951 Long term (current) use of inhaled steroids: Secondary | ICD-10-CM | POA: Insufficient documentation

## 2021-03-22 DIAGNOSIS — G8929 Other chronic pain: Secondary | ICD-10-CM | POA: Insufficient documentation

## 2021-03-22 LAB — URINALYSIS, ROUTINE W REFLEX MICROSCOPIC
Bilirubin Urine: NEGATIVE
Glucose, UA: NEGATIVE mg/dL
Hgb urine dipstick: NEGATIVE
Ketones, ur: NEGATIVE mg/dL
Leukocytes,Ua: NEGATIVE
Nitrite: NEGATIVE
Protein, ur: NEGATIVE mg/dL
Specific Gravity, Urine: 1.003 — ABNORMAL LOW (ref 1.005–1.030)
pH: 7 (ref 5.0–8.0)

## 2021-03-22 MED ORDER — HYDROMORPHONE HCL 1 MG/ML IJ SOLN
1.0000 mg | Freq: Once | INTRAMUSCULAR | Status: AC
  Administered 2021-03-22: 1 mg via INTRAMUSCULAR
  Filled 2021-03-22: qty 1

## 2021-03-22 MED ORDER — PROMETHAZINE HCL 25 MG/ML IJ SOLN: 25.0000 mg | Freq: Four times a day (QID) | INTRAMUSCULAR | Status: DC | PRN

## 2021-03-22 NOTE — ED Provider Notes (Signed)
[facility] Provider Note   CSN: [id number] Arrival date & time: 03/22/21  1203     History Chief Complaint  Patient presents with   Leg Pain    [name] is a 45 y.o. female.  Pt complains of an exacerbation of pain from interstitial cystitis  Pt reports she called her urologist office and was told that they might be able to see her tomorrow.  Pt here requesting help with pain.  Pt is on hydrocodone at home.  Pt reports she is suppose to have a bladder stimulator placed but she does not weigh enough to have procedure   The history is provided by the patient. No language interpreter was used.      Past Medical History:  Diagnosis Date   Anal fissure 03/11/2009   Anemia    Anxiety and depression    ARTHRITIS 08/31/2007   Asthma    BENZODIAZEPINE ADDICTION 08/31/2007   Bipolar 1 disorder (HCC)    Bowel obstruction (HCC)    Breakdown of urinary electronic stimulator device, init (HCC)    BRONCHITIS, RECURRENT 08/23/2009   Asthmatic Bronchitis-Dr. Melvyn Novas.....-HFA 75% 12/04/2008>75% 02/05/2009>75% 08/04/2009 -PFT's 01/04/2009 2.56 (86%) ratio 75, no resp to B2 and DLC0 67% > 80 after correction    Cancer (HCC)    cervical cancer   Chronic interstitial cystitis 03/11/2009   Chronic nausea    Chronic pain    COLONIC POLYPS, HX OF 07/25/2006   ADENOMATOUS POLYP   COPD (chronic obstructive pulmonary disease) (HCC)    Endometriosis    FIBROMYALGIA 08/31/2007   GERD 02/05/2009   Hyperlipidemia    HYPERTENSION 08/31/2007   IBS 03/11/2009   Internal hemorrhoids    Migraine headache    NEPHROLITHIASIS 08/31/2007   Pancreatitis    PONV (postoperative nausea and vomiting)    RECTAL BLEEDING 03/11/2009   Sciatica    right leg   Seizures ([place])    been about 1 year since last seisure per pt   SLEEP APNEA 08/31/2007   Substance abuse ([place])    Thyroid disease    Uterine cyst     Patient Active Problem List   Diagnosis Date Noted   Dysphagia    UTI  (urinary tract infection) 01/21/2021   Thrombocytosis 01/21/2021   Elevated lipase 01/21/2021   Hypokalemia 01/21/2021   Fibromyalgia 01/21/2021   History of pancreatitis 01/21/2021   Nausea without vomiting 05/24/2020   Abdominal pain 05/24/2020   Severe protein-calorie malnutrition ([place]) 05/24/2020   Encounter by telehealth for suspected COVID-19 03/20/2019   Abdominal pain, epigastric 10/07/2018   Intractable vomiting 08/09/2018   Compression fracture of fifth lumbar vertebra ([place]) 02/07/2018   Altered mental status 02/07/2018   Generalized weakness 01/24/2018   MDD (major depressive disorder) 04/23/2017   Substance abuse ([place]) 04/19/2017   Hyperthyroidism 02/17/2015   Anxiety 01/28/2015   Clinical depression 01/28/2015   COPD with acute exacerbation ([place]) 11/26/2013   Vaginitis and vulvovaginitis 11/26/2013   Recurrent pneumonia 02/02/2013   Left arm pain 01/13/2013   Left arm pain 01/13/2013    Class: Acute   Dizziness and giddiness 01/13/2013   Acute bronchitis 12/07/2012   Rib pain 12/07/2012   Insomnia 08/26/2012   Diarrhea 06/19/2012   Heme positive stool 06/19/2012   Bloating 06/19/2012   Nausea alone 06/19/2012   Bladder retention 04/24/2012   Tremor 03/05/2012   Dysuria 03/05/2012   Failure to thrive in adult 06/21/2011   Airway hyperreactivity 03/08/2011   Back pain,  chronic 03/08/2011   Chronic obstructive pulmonary disease ([place]) 03/08/2011   Acid reflux 03/08/2011   Cystitis 01/22/2011   Bilateral hand numbness 01/13/2011   WEIGHT LOSS 06/24/2010   RIB PAIN, LEFT SIDED 05/04/2010   ABDOMINAL PAIN, LEFT UPPER QUADRANT 05/04/2010   Unspecified otitis media 10/26/2009   BRONCHITIS, RECURRENT 08/23/2009   URI, ACUTE 08/04/2009   FATIGUE 07/06/2009   IBS 03/11/2009   ANAL FISSURE 03/11/2009   RECTAL BLEEDING 03/11/2009   Chronic interstitial cystitis 03/11/2009   COLONIC POLYPS, HX OF 03/11/2009   GERD 02/05/2009   SMOKER 12/04/2008   chronic asthma  poorly controlled 12/04/2008   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   BENZODIAZEPINE ADDICTION 08/31/2007   Bipolar disorder ([place]) 08/31/2007   HYPERTENSION 08/31/2007   EMPHYSEMA 08/31/2007   NEPHROLITHIASIS 08/31/2007   ARTHRITIS 08/31/2007   FIBROMYALGIA 08/31/2007   SLEEP APNEA 08/31/2007    Past Surgical History:  Procedure Laterality Date   ABDOMINAL HYSTERECTOMY     BIOPSY  10/25/2018   Procedure: BIOPSY;  Surgeon: Rogene Houston, MD;  Location: [facility];  Service: Endoscopy;;  gastric   BIOPSY  06/11/2020   Procedure: BIOPSY;  Surgeon: Montez Morita, Quillian Quince, MD;  Location: [facility];  Service: Gastroenterology;;  small bowel   bladder stretching x6     BLADDER SURGERY     stimulator placed and stretching    CHOLECYSTECTOMY     COLONOSCOPY     COLONOSCOPY WITH PROPOFOL N/A 09/03/2014   Procedure: COLONOSCOPY WITH PROPOFOL;  Surgeon: Milus Banister, MD;  Location: [facility];  Service: Endoscopy;  Laterality: N/A;   ESOPHAGOGASTRODUODENOSCOPY (EGD) WITH PROPOFOL N/A 10/25/2018   Procedure: ESOPHAGOGASTRODUODENOSCOPY (EGD) WITH PROPOFOL;  Surgeon: Rogene Houston, MD;  Location: [facility];  Service: Endoscopy;  Laterality: N/A;  11:15   ESOPHAGOGASTRODUODENOSCOPY (EGD) WITH PROPOFOL N/A 06/11/2020   Procedure: ESOPHAGOGASTRODUODENOSCOPY (EGD) WITH PROPOFOL;  Surgeon: Harvel Quale, MD;  Location: [facility];  Service: Gastroenterology;  Laterality: N/A;   interstitial cystitis     IR GASTROSTOMY TUBE MOD SED  01/27/2021   PACEMAKER INSERTION     in hip for interstitial cystitis   pacemaker removal     removal of uterine cyst and scrapped uterus     replaced bladder pacemaker       OB History     Gravida  3   Para  2   Term      Preterm      AB  1   Living  2      SAB  1   IAB  0   Ectopic  0   Multiple  0   Live Births  2           Family History  Problem Relation Age of Onset   Heart disease Father    Asthma  Maternal Grandmother    Emphysema Maternal Grandfather    Cancer Maternal Grandfather        Lung Cancer   Cancer Other        Lung Cancer-Aunt   Colon cancer Neg Hx    Esophageal cancer Neg Hx    Rectal cancer Neg Hx    Stomach cancer Neg Hx    Thyroid disease Neg Hx     Social History   Tobacco Use   Smoking status: Every Day    Packs/day: 0.50    Years: 14.00    Pack years: 7.00    Types: Cigarettes  Smokeless tobacco: Never  Vaping Use   Vaping Use: Some days  Substance Use Topics   Alcohol use: Not Currently    Comment: last drink 3 weeks ago; recently released from rehab   Drug use: Not Currently    Types: Marijuana    Comment: once a month    Home Medications Prior to Admission medications   Medication Sig Start Date End Date Taking? Authorizing Provider  acetaminophen (TYLENOL) 500 MG tablet Take 1,000 mg by mouth every 6 (six) hours as needed for mild pain, fever or headache.    [provider]  albuterol (PROAIR HFA) 108 (90 Base) MCG/ACT inhaler INHALE 2 PUFFS EVERY 6 HOURS AS NEEDED FOR SHORTNESS OF BREATH AND WHEEZING. Patient taking differently: No sig reported 02/27/20   Carollee Herter, Alferd Apa, DO  clonazePAM (KLONOPIN) 0.5 MG tablet Take 1 tablet (0.5 mg total) by mouth 3 (three) times daily as needed for anxiety. 05/01/19   [name], DO  cyclobenzaprine (FLEXERIL) 5 MG tablet Take 1 tablet (5 mg total) by mouth 3 (three) times daily as needed for muscle spasms. 05/21/20   Mordecai Rasmussen, MD  estradiol (ESTRACE) 0.1 MG/GM vaginal cream Place 1 Applicatorful vaginally every 3 (three) days. 11/21/19   [provider]  estradiol (ESTRACE) 2 MG tablet Take 2 mg by mouth daily.    [provider]  fluticasone (FLONASE) 50 MCG/ACT nasal spray Place 2 sprays into both nostrils daily. Patient taking differently: Place 2 sprays into both nostrils daily as needed for allergies. 04/22/19   [name], DO  gabapentin  (NEURONTIN) 800 MG tablet Take 0.5 tablets (400 mg total) by mouth in the morning, at noon, in the evening, and at bedtime. 02/01/21   Barb Merino, MD  HYDROcodone-acetaminophen (NORCO/VICODIN) 5-325 MG tablet Take 1 tablet by mouth 3 (three) times daily as needed. 02/01/21   Barb Merino, MD  hydrOXYzine (ATARAX/VISTARIL) 50 MG tablet Take 50 mg by mouth at bedtime. 08/26/18   [provider]  Hyoscyamine Sulfate SL 0.125 MG SUBL Take 0.125 mg by mouth every 4 (four) hours as needed (for cramping). 11/04/19   [provider]  mirtazapine (REMERON) 15 MG tablet Take 15 mg by mouth at bedtime. 11/24/20   [provider]  omeprazole (PRILOSEC) 40 MG capsule TAKE 1 CAPSULE BY MOUTH ONCE DAILY. Patient taking differently: Take 40 mg by mouth daily. 06/22/20   Roma Schanz R, DO  ondansetron (ZOFRAN) 4 MG tablet Take 1 tablet (4 mg total) by mouth every 8 (eight) hours as needed for nausea or vomiting. 02/09/21   Marcello Fennel, PA-C  ondansetron (ZOFRAN-ODT) 8 MG disintegrating tablet Take 1 tablet (8 mg total) by mouth every 8 (eight) hours as needed for nausea or vomiting. 02/63/78   Delora Fuel, MD  oxybutynin (DITROPAN XL) 15 MG 24 hr tablet Take 15 mg by mouth daily. 09/16/19   [provider]  promethazine (PHENERGAN) 25 MG tablet TAKE (1) TABLET EVERY SIX HOURS AS NEEDED FOR NAUSEA AND VOMITING. 02/14/21   Carollee Herter, Alferd Apa, DO  QUEtiapine (SEROQUEL) 400 MG tablet Take 400 mg by mouth at bedtime.  09/16/18   [provider]  RESTASIS 0.05 % ophthalmic emulsion Place 1 drop into both eyes 2 (two) times daily. 02/26/20   [provider]  SYMBICORT 80-4.5 MCG/ACT inhaler INHALE 2 PUFFS INTO THE LUNGS TWICE DAILY. Patient taking differently: Inhale 2 puffs into the lungs in the morning and  at bedtime. 12/28/20   Saguier, Percell Miller, PA-C    Allergies    Abilify [aripiprazole], Amitriptyline, Metoclopramide hcl, Propoxyphene, Toradol  [ketorolac tromethamine], Tramadol, Ambien [zolpidem tartrate], Eszopiclone, Varenicline, Buprenorphine hcl, Demerol [meperidine], Emetrol, and Morphine and related  Review of Systems   Review of Systems  All other systems reviewed and are negative.  Physical Exam Updated Vital Signs BP 104/75    Pulse 80    Temp 97.9 F (36.6 C) (Oral)    Resp 18    Ht 5\' 4"  (1.626 m)    Wt 42.6 kg    SpO2 97%    BMI 16.14 kg/m   Physical Exam Vitals and nursing note reviewed.  Constitutional:      Appearance: She is well-developed.     Comments: Thin, appears underweight  HENT:     Head: Normocephalic.     Mouth/Throat:     Mouth: Mucous membranes are moist.  Eyes:     Extraocular Movements: Extraocular movements intact.     Pupils: Pupils are equal, round, and reactive to light.  Cardiovascular:     Rate and Rhythm: Normal rate.  Pulmonary:     Effort: Pulmonary effort is normal.  Abdominal:     General: Abdomen is flat. There is no distension.  Musculoskeletal:        General: Normal range of motion.     Cervical back: Normal range of motion.  Skin:    General: Skin is warm.  Neurological:     General: No focal deficit present.     Mental Status: She is alert and oriented to person, place, and time.  Psychiatric:        Mood and Affect: Mood normal.    ED Results / Procedures / Treatments   Labs (all labs ordered are listed, but only abnormal results are displayed) Labs Reviewed  URINALYSIS, ROUTINE W REFLEX MICROSCOPIC - Abnormal; Notable for the following components:      Result Value   Color, Urine STRAW (*)    Specific Gravity, Urine 1.003 (*)    All other components within normal limits    EKG None  Radiology No results found.  Procedures Procedures   Medications Ordered in ED Medications  promethazine (PHENERGAN) injection 25 mg (has no administration in time range)  HYDROmorphone (DILAUDID) injection 1 mg (has no administration in time range)  HYDROmorphone  (DILAUDID) injection 1 mg (1 mg Intramuscular Given 03/22/21 1349)    ED Course  I have reviewed the triage vital signs and the nursing notes.  Pertinent labs & imaging results that were available during my care of the patient were reviewed by me and considered in my medical decision making (see chart for details).    MDM Rules/Calculators/A&P                           MDM:  Pt given phenergan and dilaudid.  Ua negative,  Urology notes from [facility] reviewed.   Final Clinical Impression(s) / ED Diagnoses Final diagnoses:  Other chronic pain    Rx / DC Orders ED Discharge Orders     None     An After Visit Summary was printed and given to the patient.    Fransico Meadow, PA-C 03/22/21 2127    Lorelle Gibbs, DO 03/24/21 [phone]

## 2021-03-22 NOTE — ED Triage Notes (Signed)
Leg and back pain for the past 2 days

## 2021-03-22 NOTE — Discharge Instructions (Addendum)
Call your Physician to be seen

## 2021-03-25 ENCOUNTER — Ambulatory Visit: Payer: Self-pay | Admitting: *Deleted

## 2021-03-25 NOTE — Telephone Encounter (Signed)
Pt calling in on the community line.    She has a feeding tube that has bursted it's happened before.   The end piece on the side has a hole in it and it's leaking out terrible.   Everything leaks out.  I'm so weak.  I can't be seen until March by my dr.   I have 2 hairline fractures in my hips and a displaced hip.  She mentioned this is where she fell down the steps and I can't sit in the ED waiting room.    I encouraged her to call 911 at this point.   She is refusing to go to the ED or call 911.   I let her know they could replace the feeding tube at the hospital and if it's leaking so bad you can't get your nutrition you need to call 911 and let them transport you via stretcher to the ED.   She still refused.  I fell down the steps and fractured my hip and hit my head.  (I wasn't able to determine when this fall occurred).  I encouraged her to call 911 since she can't sit up.    I again encouraged her to call 911 because they can transport you via stretcher and you won't have to sit up.   She is refusing to call 911 or go to the ED.     I asked if she had a PCP.  She has tried to get in and see them but they can't see her until March.   "I don't know what to do".   I asked her if she had a stomach dr and she said,  "No".    She is refusing  to go to the ED.   "I know you can't help me but I don't know what to do"  "I can't sit in a car because of my hip".     She hung up at this point.

## 2021-03-28 ENCOUNTER — Other Ambulatory Visit: Payer: Self-pay | Admitting: Family Medicine

## 2021-03-31 DIAGNOSIS — F319 Bipolar disorder, unspecified: Secondary | ICD-10-CM | POA: Diagnosis not present

## 2021-03-31 DIAGNOSIS — M5441 Lumbago with sciatica, right side: Secondary | ICD-10-CM | POA: Diagnosis not present

## 2021-03-31 DIAGNOSIS — Z79899 Other long term (current) drug therapy: Secondary | ICD-10-CM | POA: Diagnosis not present

## 2021-03-31 DIAGNOSIS — G8929 Other chronic pain: Secondary | ICD-10-CM | POA: Diagnosis not present

## 2021-03-31 DIAGNOSIS — E8941 Symptomatic postprocedural ovarian failure: Secondary | ICD-10-CM | POA: Diagnosis not present

## 2021-04-14 DIAGNOSIS — J40 Bronchitis, not specified as acute or chronic: Secondary | ICD-10-CM | POA: Diagnosis not present

## 2021-04-19 DIAGNOSIS — M5441 Lumbago with sciatica, right side: Secondary | ICD-10-CM | POA: Diagnosis not present

## 2021-04-19 DIAGNOSIS — G8929 Other chronic pain: Secondary | ICD-10-CM | POA: Diagnosis not present

## 2021-04-19 DIAGNOSIS — E8941 Symptomatic postprocedural ovarian failure: Secondary | ICD-10-CM | POA: Diagnosis not present

## 2021-04-19 DIAGNOSIS — F319 Bipolar disorder, unspecified: Secondary | ICD-10-CM | POA: Diagnosis not present

## 2021-04-21 DIAGNOSIS — Z681 Body mass index (BMI) 19 or less, adult: Secondary | ICD-10-CM | POA: Diagnosis not present

## 2021-04-21 DIAGNOSIS — G4730 Sleep apnea, unspecified: Secondary | ICD-10-CM | POA: Diagnosis not present

## 2021-04-21 DIAGNOSIS — J449 Chronic obstructive pulmonary disease, unspecified: Secondary | ICD-10-CM | POA: Diagnosis not present

## 2021-04-21 DIAGNOSIS — G4700 Insomnia, unspecified: Secondary | ICD-10-CM | POA: Diagnosis not present

## 2021-04-21 DIAGNOSIS — G4701 Insomnia due to medical condition: Secondary | ICD-10-CM | POA: Diagnosis not present

## 2021-04-26 ENCOUNTER — Other Ambulatory Visit: Payer: Self-pay

## 2021-04-26 ENCOUNTER — Ambulatory Visit (INDEPENDENT_AMBULATORY_CARE_PROVIDER_SITE_OTHER): Payer: BC Managed Care – PPO | Admitting: Gastroenterology

## 2021-04-26 ENCOUNTER — Encounter (INDEPENDENT_AMBULATORY_CARE_PROVIDER_SITE_OTHER): Payer: Self-pay | Admitting: Gastroenterology

## 2021-04-26 VITALS — BP 116/80 | HR 78 | Temp 98.0°F | Ht 64.0 in | Wt 96.7 lb

## 2021-04-26 DIAGNOSIS — K8689 Other specified diseases of pancreas: Secondary | ICD-10-CM

## 2021-04-26 DIAGNOSIS — E43 Unspecified severe protein-calorie malnutrition: Secondary | ICD-10-CM

## 2021-04-26 DIAGNOSIS — R110 Nausea: Secondary | ICD-10-CM | POA: Diagnosis not present

## 2021-04-26 DIAGNOSIS — K582 Mixed irritable bowel syndrome: Secondary | ICD-10-CM | POA: Diagnosis not present

## 2021-04-26 MED ORDER — PANTOPRAZOLE SODIUM 40 MG PO TBEC: 40.0000 mg | DELAYED_RELEASE_TABLET | Freq: Every day | ORAL | 3 refills | Status: DC

## 2021-04-26 MED ORDER — PANCRELIPASE (LIP-PROT-AMYL) 36000-114000 UNITS PO CPEP: 36000.0000 [IU] | ORAL_CAPSULE | Freq: Three times a day (TID) | ORAL | 7 refills | Status: AC

## 2021-04-26 NOTE — Progress Notes (Signed)
Referring Provider: Neale Burly, MD Primary Care Physician:  Neale Burly, MD Primary GI Physician: Jenetta Downer  Chief Complaint  Patient presents with   Pancreatitis    Patient states she is here today due to recent diagnoses of pancreatitis. She states she is needing [place] to take over the filling of her Creon 36,000 one capsule per meal per day.   HPI:   [name] is a 46 y.o. female with past medical history of BIpolar disorder, cervical cancer, COPD, depression, fibromyalgia, GERD, HTN, IBS, seizures, hx of substance abuse, OSA, HLD.   Patient presenting today for pancreatitis.   Patient last seen in our clinic on 05/24/20. At that time patient reported ongoing abdominal pain and poor oral intake that had been chronic throughout her life. She endorsed more recent nausea and vomiting as well as weight loss.  She was on creon at that time, prescribed by an outside provider which she felt had improved her symptoms some. She had diarrhea about 8x/week, with some constipation maybe twice per month. She reported previous hx of pancreatitis but no records in EMR were available for review. It was recommended to proceed with GES if EGD and colonoscopy were unremarkable, though symptoms thought lkely related to polypharmacy and psychiatric disease. Advised to do boost/ensure 3-4x/day. Multiple ER visits since last OV.  Patient seen by [facility] Dr Lyndel Safe during hospitalization in oct 2022 for abdominal pain, nausea, vomiting and dysphagia to solids and sometimes liquids. She had negative modified barium swallow during admission and placement of PEG tube on 01/27/21 for enteral feedings due to chronic malnutrition/weight loss.   ED visit on 02/09/21 for concern of abdominal infection, CT A/P at that time with gastrostomy tube in body of stomach, mild body wall edema without focal inflammation or fluid collection at insertion site. Lipase at that time was WNL, CMP WNL.   Today, she reports that  she was diagnosed with pancreatitis by Dr. Sherrie Sport (question if this was a diagnosis of pancreatic insufficiency as no documented imaging to confirm pancreatitis) who prescribed her creon, she states that symptoms have improved greatly on creon, however, insurance will not cover it unless it is prescribed by [place]. She still has some episodes of nausea maybe 1-2x/week, previously everyday all day. She has a mix of constipation and diarrhea, 1-2 BMs per day. Typically has more diarrhea than formed stools, diarrhea is usually more watery though frequency improved since being on creon. She denies any rectal bleeding or melena.   Currently on omeprazole 40mg  once daily for her GERD, she is having heartburn almost anytime she eats or anytime she drinks any soda.   States that she was doing well with weight gain since getting her PEG tube, however, she had an episode of acute illness recently and lost a few pounds at that time, this has since resolved. She reports that weight remains between 93-97 lbs.  She reports that appetite is good. She tries to do her tube feeds first and then she will eat after if she still feels hungry, she does 4 tube feedings per day with 1-2 meals per day. She reports that dysphagia has improved some, still has some issues with thicker foods, but overall this has improved.   Flex sig:06/11/20 - attempted colonoscopy, converted to flex sig due to presence of solid stool, Stool in the rectum, in the recto-sigmoid colon and in the sigmoid colon. - No specimens collected. Last Endoscopy:06/11/20 - White nummular plaques in esophageal mucosa. ([place]) -  Normal stomach. - Normal examined duodenum. Biopsied.  Recommendations:  Repeat colonoscopy now as prep was inadequate, needs two day prep with mag citrate  Past Medical History:  Diagnosis Date   Anal fissure 03/11/2009   Anemia    Anxiety and depression    ARTHRITIS 08/31/2007   Asthma    BENZODIAZEPINE ADDICTION 08/31/2007    Bipolar 1 disorder (HCC)    Bowel obstruction (HCC)    Breakdown of urinary electronic stimulator device, init ([place])    BRONCHITIS, RECURRENT 08/23/2009   Asthmatic Bronchitis-Dr. Melvyn Novas.....-HFA 75% 12/04/2008>75% 02/05/2009>75% 08/04/2009 -PFT's 01/04/2009 2.56 (86%) ratio 75, no resp to B2 and DLC0 67% > 80 after correction    Cancer (HCC)    cervical cancer   Chronic interstitial cystitis 03/11/2009   Chronic nausea    Chronic pain    COLONIC POLYPS, HX OF 07/25/2006   ADENOMATOUS POLYP   COPD (chronic obstructive pulmonary disease) (HCC)    Endometriosis    FIBROMYALGIA 08/31/2007   GERD 02/05/2009   Hyperlipidemia    HYPERTENSION 08/31/2007   IBS 03/11/2009   Internal hemorrhoids    Migraine headache    NEPHROLITHIASIS 08/31/2007   Pancreatitis    PONV (postoperative nausea and vomiting)    RECTAL BLEEDING 03/11/2009   Sciatica    right leg   Seizures ([place])    been about 1 year since last seisure per pt   SLEEP APNEA 08/31/2007   Substance abuse ([place])    Thyroid disease    Uterine cyst     Past Surgical History:  Procedure Laterality Date   ABDOMINAL HYSTERECTOMY     BIOPSY  10/25/2018   Procedure: BIOPSY;  Surgeon: Rogene Houston, MD;  Location: [facility];  Service: Endoscopy;;  gastric   BIOPSY  06/11/2020   Procedure: BIOPSY;  Surgeon: Montez Morita, Quillian Quince, MD;  Location: [facility];  Service: Gastroenterology;;  small bowel   bladder stretching x6     BLADDER SURGERY     stimulator placed and stretching    CHOLECYSTECTOMY     COLONOSCOPY     COLONOSCOPY WITH PROPOFOL N/A 09/03/2014   Procedure: COLONOSCOPY WITH PROPOFOL;  Surgeon: Milus Banister, MD;  Location: [facility];  Service: Endoscopy;  Laterality: N/A;   ESOPHAGOGASTRODUODENOSCOPY (EGD) WITH PROPOFOL N/A 10/25/2018   Procedure: ESOPHAGOGASTRODUODENOSCOPY (EGD) WITH PROPOFOL;  Surgeon: Rogene Houston, MD;  [facility]: [facility];  Service: Endoscopy;  Laterality: N/A;  11:15    ESOPHAGOGASTRODUODENOSCOPY (EGD) WITH PROPOFOL N/A 06/11/2020   Procedure: ESOPHAGOGASTRODUODENOSCOPY (EGD) WITH PROPOFOL;  Surgeon: Harvel Quale, MD;  Location: [facility];  Service: Gastroenterology;  Laterality: N/A;   interstitial cystitis     IR GASTROSTOMY TUBE MOD SED  01/27/2021   PACEMAKER INSERTION     in hip for interstitial cystitis   pacemaker removal     removal of uterine cyst and scrapped uterus     replaced bladder pacemaker      Current Outpatient Medications  Medication Sig Dispense Refill   acetaminophen (TYLENOL) 500 MG tablet Take 1,000 mg by mouth every 6 (six) hours as needed for mild pain, fever or headache.     albuterol (PROAIR HFA) 108 (90 Base) MCG/ACT inhaler INHALE 2 PUFFS EVERY 6 HOURS AS NEEDED FOR SHORTNESS OF BREATH AND WHEEZING. (Patient taking differently: Inhale 2 puffs into the lungs every 6 (six) hours as needed for shortness of breath or wheezing.) 8.5 g 1   clonazePAM (KLONOPIN) 0.5 MG tablet Take 1  tablet (0.5 mg total) by mouth 3 (three) times daily as needed for anxiety. 90 tablet 1   cyclobenzaprine (FLEXERIL) 5 MG tablet Take 1 tablet (5 mg total) by mouth 3 (three) times daily as needed for muscle spasms. 15 tablet 0   estradiol (ESTRACE) 0.1 MG/GM vaginal cream Place 1 Applicatorful vaginally every 3 (three) days.     estradiol (ESTRACE) 2 MG tablet Take 2 mg by mouth daily.     fluticasone (FLONASE) 50 MCG/ACT nasal spray Place 2 sprays into both nostrils daily. (Patient taking differently: Place 2 sprays into both nostrils daily as needed for allergies.) 16 g 6   gabapentin (NEURONTIN) 800 MG tablet Take 0.5 tablets (400 mg total) by mouth in the morning, at noon, in the evening, and at bedtime.     HYDROcodone-acetaminophen (NORCO/VICODIN) 5-325 MG tablet Take 1 tablet by mouth 3 (three) times daily as needed. 6 tablet 0   hydrOXYzine (ATARAX/VISTARIL) 50 MG tablet Take 50 mg by mouth at bedtime.     Hyoscyamine Sulfate SL 0.125  MG SUBL Take 0.125 mg by mouth every 4 (four) hours as needed (for cramping).     lipase/protease/amylase (CREON) 36000 UNITS CPEP capsule Take 36,000 Units by mouth 4 (four) times daily -  with meals and at bedtime.     mirtazapine (REMERON) 15 MG tablet Take 15 mg by mouth at bedtime.     omeprazole (PRILOSEC) 40 MG capsule TAKE 1 CAPSULE BY MOUTH ONCE DAILY. 90 capsule 0   ondansetron (ZOFRAN) 4 MG tablet Take 1 tablet (4 mg total) by mouth every 8 (eight) hours as needed for nausea or vomiting. 12 tablet 0   ondansetron (ZOFRAN-ODT) 8 MG disintegrating tablet Take 1 tablet (8 mg total) by mouth every 8 (eight) hours as needed for nausea or vomiting. 20 tablet 0   oxybutynin (DITROPAN XL) 15 MG 24 hr tablet Take 15 mg by mouth daily.     promethazine (PHENERGAN) 25 MG tablet TAKE (1) TABLET EVERY SIX HOURS AS NEEDED FOR NAUSEA AND VOMITING. 12 tablet 1   QUEtiapine (SEROQUEL) 400 MG tablet Take 400 mg by mouth at bedtime.      RESTASIS 0.05 % ophthalmic emulsion Place 1 drop into both eyes 2 (two) times daily.     SYMBICORT 80-4.5 MCG/ACT inhaler INHALE 2 PUFFS INTO THE LUNGS TWICE DAILY. (Patient taking differently: Inhale 2 puffs into the lungs in the morning and at bedtime.) 10.2 g 0   No current facility-administered medications for this visit.    Allergies as of 04/26/2021 - Review Complete 04/26/2021  Allergen Reaction Noted   Abilify [aripiprazole] Swelling, Palpitations, and Other (See Comments) 12/28/2011   Amitriptyline Anaphylaxis 12/06/2012   Metoclopramide hcl Other (See Comments)    Propoxyphene Rash 02/23/2015   Toradol [ketorolac tromethamine]  03/20/2020   Tramadol Swelling, Other (See Comments), and Rash 12/28/2011   Ambien [zolpidem tartrate] Other (See Comments) 12/28/2011   Eszopiclone Other (See Comments) 06/21/2011   Varenicline Other (See Comments) 06/19/2013   Buprenorphine hcl Itching and Hives 05/12/2014   Demerol [meperidine] Rash 06/15/2013   Emetrol Hives,  Itching, and Rash 06/19/2013   Morphine and related Hives and Itching 12/28/2011    Family History  Problem Relation Age of Onset   Heart disease Father    Asthma Maternal Grandmother    Emphysema Maternal Grandfather    Cancer Maternal Grandfather        Lung Cancer   Cancer Other  Lung Cancer-Aunt   Colon cancer Neg Hx    Esophageal cancer Neg Hx    Rectal cancer Neg Hx    Stomach cancer Neg Hx    Thyroid disease Neg Hx     Social History   Socioeconomic History   Marital status: Married    Spouse name: Not on file   Number of children: 2   Years of education: Not on file   Highest education level: Not on file  Occupational History    Employer: UNEMPLOYED  Tobacco Use   Smoking status: Every Day    Packs/day: 0.50    Years: 14.00    Pack years: 7.00    Types: Cigarettes   Smokeless tobacco: Never  Vaping Use   Vaping Use: Former  Substance and Sexual Activity   Alcohol use: Not Currently    Comment: last drink 3 weeks ago; recently released from rehab   Drug use: Yes    Types: Marijuana    Comment: once a month   Sexual activity: Not on file  Other Topics Concern   Not on file  Social History Narrative   Homemaker   Daily Caffeine Use-Mtn. Dew         Social Determinants of Radio broadcast assistant Strain: Not on file  Food Insecurity: Not on file  Transportation Needs: Not on file  Physical Activity: Not on file  Stress: Not on file  Social Connections: Not on file   Review of systems General: negative for malaise, night sweats, fever, chills, weight loss Neck: Negative for lumps, goiter, pain and significant neck swelling Resp: Negative for cough, wheezing, dyspnea at rest CV: Negative for chest pain, leg swelling, palpitations, orthopnea GI: denies melena, hematochezia, vomiting, odyonophagia, early satiety or unintentional weight loss. +nausea +constipation +diarrhea +dysphagia with thicker foods MSK: Negative for joint pain or  swelling, back pain, and muscle pain. Derm: Negative for itching or rash Psych: Denies depression, anxiety, memory loss, confusion. No homicidal or suicidal ideation.  Heme: Negative for prolonged bleeding, bruising easily, and swollen nodes. Endocrine: Negative for cold or heat intolerance, polyuria, polydipsia and goiter. Neuro: negative for tremor, gait imbalance, syncope and seizures. The remainder of the review of systems is noncontributory.  Physical Exam: BP 116/80 (BP Location: Left Arm, Patient Position: Sitting, Cuff Size: Small)    Pulse 78    Temp 98 F (36.7 C) (Oral)    Ht 5\' 4"  (1.626 m)    Wt 96 lb 11.2 oz (43.9 kg)    BMI 16.60 kg/m  General:   Alert and oriented. No distress noted. Pleasant and cooperative.  Head:  Normocephalic and atraumatic. Eyes:  Conjuctiva clear without scleral icterus. Mouth:  Oral mucosa pink and moist. Good dentition. No lesions. Heart: Normal rate and rhythm, s1 and s2 heart sounds present.  Lungs: Clear lung sounds in all lobes. Respirations equal and unlabored. Abdomen:  +BS, soft, non-tender and non-distended. No rebound or guarding. No HSM or masses noted. PEG tube noted to LUQ, no erythema or signs of infection, clean site Derm: No palmar erythema or jaundice Msk:  Symmetrical without gross deformities. Normal posture. Extremities:  Without edema. Neurologic:  Alert and  oriented x4 Psych:  Alert and cooperative. Normal mood and affect.  Invalid input(s): 6 MONTHS   ASSESSMENT: [name] is a 46 y.o. female presenting today to get colonoscopy scheduled and for pancreatitis/pancreatic insufficiency.   Patient reports she was diagnosed with pancreatitis by Dr. Sherrie Sport who started her  on Creon, I suspect this was pancreatic insufficiency vs pancreatitis given no documented imaging to confirm this, she states that since starting creon 1 capsule with meals and at bedtime last year and then having PEG tube placed in Oct 2022, she is doing  well. Nausea is much improved, occurring only 1-2 x/week. She continues to have a mix of both constipation and diarrhea but typically is only having 1-2 BMs per day now, on creon. Weight has remained stable between 93-97 lbs since PEG tube placement. Appetite is decent, though she does her tube feeds first to ensure she is getting adequate nutrients and eats 1-2 meals per day. Feels that remeron 15mg  nightly has helped improve her appetite some. We will continue creon with current dose and regimen as she is seeing good results with this. Continue Remeron as well, with good result.  GERD is not very well controlled on omeprazole 40mg  once daily, have reflux almost anytime she eats or drinks carbonated drinks. Will stop omeprazole and switch to pantoprazole 40mg  once daily as patient has been on omeprazole for a few years, likely would benefit from a change in PPI therapy.   We will get her scheduled for surveillance colonoscopy given hx of adenomatous polyps, last complete colonsocopy was 2016, as colonoscopy in march 2022 was not able to be completed due to inaequate prep. Indications, risks and benefits of procedure discussed in detail with patient. Patient verbalized understanding and is in agreement to proceed with colonoscopy at this time. Patient aware she will need two day prep   PLAN:  Protonix 40mg  once daily, 30-45 minutes prior to breakfast 2. Stop omeprazole  3. Rx Creon, continue current regimen of 1 capsule with meals and 1 capsule at bedtime  4. Schedule colonoscopy with 2 day prep with mag citrate.  5. Continue with remeron 15mg  nightly  6. Reflux precautions, avoiding heavy, greasy, spicy foods, caffeine, chocolate and alcohol, as well as staying upright 2-3 hours after eating   Follow Up: 1 year  Suda Forbess L. Alver Sorrow, MSN, APRN, AGNP-C Adult-Gerontology Nurse Practitioner [facility]

## 2021-04-26 NOTE — H&P (View-Only) (Signed)
Referring Provider: Neale Burly, MD Primary Care Physician:  Neale Burly, MD Primary GI Physician: Jenetta Downer  Chief Complaint  Patient presents with   Pancreatitis    Patient states she is here today due to recent diagnoses of pancreatitis. She states she is needing [place] to take over the filling of her Creon 36,000 one capsule per meal per day.   HPI:   [name] is a 46 y.o. female with past medical history of BIpolar disorder, cervical cancer, COPD, depression, fibromyalgia, GERD, HTN, IBS, seizures, hx of substance abuse, OSA, HLD.   Patient presenting today for pancreatitis.   Patient last seen in our clinic on 05/24/20. At that time patient reported ongoing abdominal pain and poor oral intake that had been chronic throughout her life. She endorsed more recent nausea and vomiting as well as weight loss.  She was on creon at that time, prescribed by an outside provider which she felt had improved her symptoms some. She had diarrhea about 8x/week, with some constipation maybe twice per month. She reported previous hx of pancreatitis but no records in EMR were available for review. It was recommended to proceed with GES if EGD and colonoscopy were unremarkable, though symptoms thought lkely related to polypharmacy and psychiatric disease. Advised to do boost/ensure 3-4x/day. Multiple ER visits since last OV.  Patient seen by [facility] Dr Lyndel Safe during hospitalization in oct 2022 for abdominal pain, nausea, vomiting and dysphagia to solids and sometimes liquids. She had negative modified barium swallow during admission and placement of PEG tube on 01/27/21 for enteral feedings due to chronic malnutrition/weight loss.   ED visit on 02/09/21 for concern of abdominal infection, CT A/P at that time with gastrostomy tube in body of stomach, mild body wall edema without focal inflammation or fluid collection at insertion site. Lipase at that time was WNL, CMP WNL.   Today, she reports that  she was diagnosed with pancreatitis by Dr. Sherrie Sport (question if this was a diagnosis of pancreatic insufficiency as no documented imaging to confirm pancreatitis) who prescribed her creon, she states that symptoms have improved greatly on creon, however, insurance will not cover it unless it is prescribed by [place]. She still has some episodes of nausea maybe 1-2x/week, previously everyday all day. She has a mix of constipation and diarrhea, 1-2 BMs per day. Typically has more diarrhea than formed stools, diarrhea is usually more watery though frequency improved since being on creon. She denies any rectal bleeding or melena.   Currently on omeprazole 40mg  once daily for her GERD, she is having heartburn almost anytime she eats or anytime she drinks any soda.   States that she was doing well with weight gain since getting her PEG tube, however, she had an episode of acute illness recently and lost a few pounds at that time, this has since resolved. She reports that weight remains between 93-97 lbs.  She reports that appetite is good. She tries to do her tube feeds first and then she will eat after if she still feels hungry, she does 4 tube feedings per day with 1-2 meals per day. She reports that dysphagia has improved some, still has some issues with thicker foods, but overall this has improved.   Flex sig:06/11/20 - attempted colonoscopy, converted to flex sig due to presence of solid stool, Stool in the rectum, in the recto-sigmoid colon and in the sigmoid colon. - No specimens collected. Last Endoscopy:06/11/20 - White nummular plaques in esophageal mucosa. ([place]) -  Normal stomach. - Normal examined duodenum. Biopsied.  Recommendations:  Repeat colonoscopy now as prep was inadequate, needs two day prep with mag citrate  Past Medical History:  Diagnosis Date   Anal fissure 03/11/2009   Anemia    Anxiety and depression    ARTHRITIS 08/31/2007   Asthma    BENZODIAZEPINE ADDICTION 08/31/2007    Bipolar 1 disorder (HCC)    Bowel obstruction (HCC)    Breakdown of urinary electronic stimulator device, init ([place])    BRONCHITIS, RECURRENT 08/23/2009   Asthmatic Bronchitis-Dr. Melvyn Novas.....-HFA 75% 12/04/2008>75% 02/05/2009>75% 08/04/2009 -PFT's 01/04/2009 2.56 (86%) ratio 75, no resp to B2 and DLC0 67% > 80 after correction    Cancer (HCC)    cervical cancer   Chronic interstitial cystitis 03/11/2009   Chronic nausea    Chronic pain    COLONIC POLYPS, HX OF 07/25/2006   ADENOMATOUS POLYP   COPD (chronic obstructive pulmonary disease) (HCC)    Endometriosis    FIBROMYALGIA 08/31/2007   GERD 02/05/2009   Hyperlipidemia    HYPERTENSION 08/31/2007   IBS 03/11/2009   Internal hemorrhoids    Migraine headache    NEPHROLITHIASIS 08/31/2007   Pancreatitis    PONV (postoperative nausea and vomiting)    RECTAL BLEEDING 03/11/2009   Sciatica    right leg   Seizures ([place])    been about 1 year since last seisure per pt   SLEEP APNEA 08/31/2007   Substance abuse ([place])    Thyroid disease    Uterine cyst     Past Surgical History:  Procedure Laterality Date   ABDOMINAL HYSTERECTOMY     BIOPSY  10/25/2018   Procedure: BIOPSY;  Surgeon: Rogene Houston, MD;  Location: [facility];  Service: Endoscopy;;  gastric   BIOPSY  06/11/2020   Procedure: BIOPSY;  Surgeon: Montez Morita, Quillian Quince, MD;  Location: [facility];  Service: Gastroenterology;;  small bowel   bladder stretching x6     BLADDER SURGERY     stimulator placed and stretching    CHOLECYSTECTOMY     COLONOSCOPY     COLONOSCOPY WITH PROPOFOL N/A 09/03/2014   Procedure: COLONOSCOPY WITH PROPOFOL;  Surgeon: Milus Banister, MD;  Location: [facility];  Service: Endoscopy;  Laterality: N/A;   ESOPHAGOGASTRODUODENOSCOPY (EGD) WITH PROPOFOL N/A 10/25/2018   Procedure: ESOPHAGOGASTRODUODENOSCOPY (EGD) WITH PROPOFOL;  Surgeon: Rogene Houston, MD;  Location: [facility];  Service: Endoscopy;  Laterality: N/A;  11:15    ESOPHAGOGASTRODUODENOSCOPY (EGD) WITH PROPOFOL N/A 06/11/2020   Procedure: ESOPHAGOGASTRODUODENOSCOPY (EGD) WITH PROPOFOL;  Surgeon: Harvel Quale, MD;  Location: [facility];  Service: Gastroenterology;  Laterality: N/A;   interstitial cystitis     IR GASTROSTOMY TUBE MOD SED  01/27/2021   PACEMAKER INSERTION     in hip for interstitial cystitis   pacemaker removal     removal of uterine cyst and scrapped uterus     replaced bladder pacemaker      Current Outpatient Medications  Medication Sig Dispense Refill   acetaminophen (TYLENOL) 500 MG tablet Take 1,000 mg by mouth every 6 (six) hours as needed for mild pain, fever or headache.     albuterol (PROAIR HFA) 108 (90 Base) MCG/ACT inhaler INHALE 2 PUFFS EVERY 6 HOURS AS NEEDED FOR SHORTNESS OF BREATH AND WHEEZING. (Patient taking differently: Inhale 2 puffs into the lungs every 6 (six) hours as needed for shortness of breath or wheezing.) 8.5 g 1   clonazePAM (KLONOPIN) 0.5 MG tablet Take 1  tablet (0.5 mg total) by mouth 3 (three) times daily as needed for anxiety. 90 tablet 1   cyclobenzaprine (FLEXERIL) 5 MG tablet Take 1 tablet (5 mg total) by mouth 3 (three) times daily as needed for muscle spasms. 15 tablet 0   estradiol (ESTRACE) 0.1 MG/GM vaginal cream Place 1 Applicatorful vaginally every 3 (three) days.     estradiol (ESTRACE) 2 MG tablet Take 2 mg by mouth daily.     fluticasone (FLONASE) 50 MCG/ACT nasal spray Place 2 sprays into both nostrils daily. (Patient taking differently: Place 2 sprays into both nostrils daily as needed for allergies.) 16 g 6   gabapentin (NEURONTIN) 800 MG tablet Take 0.5 tablets (400 mg total) by mouth in the morning, at noon, in the evening, and at bedtime.     HYDROcodone-acetaminophen (NORCO/VICODIN) 5-325 MG tablet Take 1 tablet by mouth 3 (three) times daily as needed. 6 tablet 0   hydrOXYzine (ATARAX/VISTARIL) 50 MG tablet Take 50 mg by mouth at bedtime.     Hyoscyamine Sulfate SL 0.125  MG SUBL Take 0.125 mg by mouth every 4 (four) hours as needed (for cramping).     lipase/protease/amylase (CREON) 36000 UNITS CPEP capsule Take 36,000 Units by mouth 4 (four) times daily -  with meals and at bedtime.     mirtazapine (REMERON) 15 MG tablet Take 15 mg by mouth at bedtime.     omeprazole (PRILOSEC) 40 MG capsule TAKE 1 CAPSULE BY MOUTH ONCE DAILY. 90 capsule 0   ondansetron (ZOFRAN) 4 MG tablet Take 1 tablet (4 mg total) by mouth every 8 (eight) hours as needed for nausea or vomiting. 12 tablet 0   ondansetron (ZOFRAN-ODT) 8 MG disintegrating tablet Take 1 tablet (8 mg total) by mouth every 8 (eight) hours as needed for nausea or vomiting. 20 tablet 0   oxybutynin (DITROPAN XL) 15 MG 24 hr tablet Take 15 mg by mouth daily.     promethazine (PHENERGAN) 25 MG tablet TAKE (1) TABLET EVERY SIX HOURS AS NEEDED FOR NAUSEA AND VOMITING. 12 tablet 1   QUEtiapine (SEROQUEL) 400 MG tablet Take 400 mg by mouth at bedtime.      RESTASIS 0.05 % ophthalmic emulsion Place 1 drop into both eyes 2 (two) times daily.     SYMBICORT 80-4.5 MCG/ACT inhaler INHALE 2 PUFFS INTO THE LUNGS TWICE DAILY. (Patient taking differently: Inhale 2 puffs into the lungs in the morning and at bedtime.) 10.2 g 0   No current facility-administered medications for this visit.    Allergies as of 04/26/2021 - Review Complete 04/26/2021  Allergen Reaction Noted   Abilify [aripiprazole] Swelling, Palpitations, and Other (See Comments) 12/28/2011   Amitriptyline Anaphylaxis 12/06/2012   Metoclopramide hcl Other (See Comments)    Propoxyphene Rash 02/23/2015   Toradol [ketorolac tromethamine]  03/20/2020   Tramadol Swelling, Other (See Comments), and Rash 12/28/2011   Ambien [zolpidem tartrate] Other (See Comments) 12/28/2011   Eszopiclone Other (See Comments) 06/21/2011   Varenicline Other (See Comments) 06/19/2013   Buprenorphine hcl Itching and Hives 05/12/2014   Demerol [meperidine] Rash 06/15/2013   Emetrol Hives,  Itching, and Rash 06/19/2013   Morphine and related Hives and Itching 12/28/2011    Family History  Problem Relation Age of Onset   Heart disease Father    Asthma Maternal Grandmother    Emphysema Maternal Grandfather    Cancer Maternal Grandfather        Lung Cancer   Cancer Other  Lung Cancer-Aunt   Colon cancer Neg Hx    Esophageal cancer Neg Hx    Rectal cancer Neg Hx    Stomach cancer Neg Hx    Thyroid disease Neg Hx     Social History   Socioeconomic History   Marital status: Married    Spouse name: Not on file   Number of children: 2   Years of education: Not on file   Highest education level: Not on file  Occupational History    Employer: UNEMPLOYED  Tobacco Use   Smoking status: Every Day    Packs/day: 0.50    Years: 14.00    Pack years: 7.00    Types: Cigarettes   Smokeless tobacco: Never  Vaping Use   Vaping Use: Former  Substance and Sexual Activity   Alcohol use: Not Currently    Comment: last drink 3 weeks ago; recently released from rehab   Drug use: Yes    Types: Marijuana    Comment: once a month   Sexual activity: Not on file  Other Topics Concern   Not on file  Social History Narrative   Homemaker   Daily Caffeine Use-Mtn. Dew         Social Determinants of Radio broadcast assistant Strain: Not on file  Food Insecurity: Not on file  Transportation Needs: Not on file  Physical Activity: Not on file  Stress: Not on file  Social Connections: Not on file   Review of systems General: negative for malaise, night sweats, fever, chills, weight loss Neck: Negative for lumps, goiter, pain and significant neck swelling Resp: Negative for cough, wheezing, dyspnea at rest CV: Negative for chest pain, leg swelling, palpitations, orthopnea GI: denies melena, hematochezia, vomiting, odyonophagia, early satiety or unintentional weight loss. +nausea +constipation +diarrhea +dysphagia with thicker foods MSK: Negative for joint pain or  swelling, back pain, and muscle pain. Derm: Negative for itching or rash Psych: Denies depression, anxiety, memory loss, confusion. No homicidal or suicidal ideation.  Heme: Negative for prolonged bleeding, bruising easily, and swollen nodes. Endocrine: Negative for cold or heat intolerance, polyuria, polydipsia and goiter. Neuro: negative for tremor, gait imbalance, syncope and seizures. The remainder of the review of systems is noncontributory.  Physical Exam: BP 116/80 (BP Location: Left Arm, Patient Position: Sitting, Cuff Size: Small)    Pulse 78    Temp 98 F (36.7 C) (Oral)    Ht 5\' 4"  (1.626 m)    Wt 96 lb 11.2 oz (43.9 kg)    BMI 16.60 kg/m  General:   Alert and oriented. No distress noted. Pleasant and cooperative.  Head:  Normocephalic and atraumatic. Eyes:  Conjuctiva clear without scleral icterus. Mouth:  Oral mucosa pink and moist. Good dentition. No lesions. Heart: Normal rate and rhythm, s1 and s2 heart sounds present.  Lungs: Clear lung sounds in all lobes. Respirations equal and unlabored. Abdomen:  +BS, soft, non-tender and non-distended. No rebound or guarding. No HSM or masses noted. PEG tube noted to LUQ, no erythema or signs of infection, clean site Derm: No palmar erythema or jaundice Msk:  Symmetrical without gross deformities. Normal posture. Extremities:  Without edema. Neurologic:  Alert and  oriented x4 Psych:  Alert and cooperative. Normal mood and affect.  Invalid input(s): 6 MONTHS   ASSESSMENT: [name] is a 46 y.o. female presenting today to get colonoscopy scheduled and for pancreatitis/pancreatic insufficiency.   Patient reports she was diagnosed with pancreatitis by Dr. Sherrie Sport who started her  on Creon, I suspect this was pancreatic insufficiency vs pancreatitis given no documented imaging to confirm this, she states that since starting creon 1 capsule with meals and at bedtime last year and then having PEG tube placed in Oct 2022, she is doing  well. Nausea is much improved, occurring only 1-2 x/week. She continues to have a mix of both constipation and diarrhea but typically is only having 1-2 BMs per day now, on creon. Weight has remained stable between 93-97 lbs since PEG tube placement. Appetite is decent, though she does her tube feeds first to ensure she is getting adequate nutrients and eats 1-2 meals per day. Feels that remeron 15mg  nightly has helped improve her appetite some. We will continue creon with current dose and regimen as she is seeing good results with this. Continue Remeron as well, with good result.  GERD is not very well controlled on omeprazole 40mg  once daily, have reflux almost anytime she eats or drinks carbonated drinks. Will stop omeprazole and switch to pantoprazole 40mg  once daily as patient has been on omeprazole for a few years, likely would benefit from a change in PPI therapy.   We will get her scheduled for surveillance colonoscopy given hx of adenomatous polyps, last complete colonsocopy was 2016, as colonoscopy in march 2022 was not able to be completed due to inaequate prep. Indications, risks and benefits of procedure discussed in detail with patient. Patient verbalized understanding and is in agreement to proceed with colonoscopy at this time. Patient aware she will need two day prep   PLAN:  Protonix 40mg  once daily, 30-45 minutes prior to breakfast 2. Stop omeprazole  3. Rx Creon, continue current regimen of 1 capsule with meals and 1 capsule at bedtime  4. Schedule colonoscopy with 2 day prep with mag citrate.  5. Continue with remeron 15mg  nightly  6. Reflux precautions, avoiding heavy, greasy, spicy foods, caffeine, chocolate and alcohol, as well as staying upright 2-3 hours after eating   Follow Up: 1 year  Cherica Heiden L. Alver Sorrow, MSN, APRN, AGNP-C Adult-Gerontology Nurse Practitioner [facility]

## 2021-04-26 NOTE — Patient Instructions (Addendum)
Stop omeprazole Start protonix 40mg  once daily, take this 30-45 minutes prior to eating in the morning I will send prescription of creon to your pharmacy as well, continue taking this as you are currently doing with meals and at bedtime as this appears to be working well for you We will get you scheduled for colonoscopy with 2 day prep  Follow up in 1 year

## 2021-04-27 DIAGNOSIS — R3915 Urgency of urination: Secondary | ICD-10-CM | POA: Diagnosis not present

## 2021-04-27 DIAGNOSIS — Z79899 Other long term (current) drug therapy: Secondary | ICD-10-CM | POA: Diagnosis not present

## 2021-04-27 DIAGNOSIS — R82998 Other abnormal findings in urine: Secondary | ICD-10-CM | POA: Diagnosis not present

## 2021-04-27 DIAGNOSIS — N3010 Interstitial cystitis (chronic) without hematuria: Secondary | ICD-10-CM | POA: Diagnosis not present

## 2021-04-28 DIAGNOSIS — F319 Bipolar disorder, unspecified: Secondary | ICD-10-CM | POA: Diagnosis not present

## 2021-04-28 DIAGNOSIS — M5441 Lumbago with sciatica, right side: Secondary | ICD-10-CM | POA: Diagnosis not present

## 2021-04-28 DIAGNOSIS — E8941 Symptomatic postprocedural ovarian failure: Secondary | ICD-10-CM | POA: Diagnosis not present

## 2021-04-28 DIAGNOSIS — G8929 Other chronic pain: Secondary | ICD-10-CM | POA: Diagnosis not present

## 2021-04-28 DIAGNOSIS — Z79899 Other long term (current) drug therapy: Secondary | ICD-10-CM | POA: Diagnosis not present

## 2021-05-03 DIAGNOSIS — M5441 Lumbago with sciatica, right side: Secondary | ICD-10-CM | POA: Diagnosis not present

## 2021-05-03 DIAGNOSIS — G8929 Other chronic pain: Secondary | ICD-10-CM | POA: Diagnosis not present

## 2021-05-03 DIAGNOSIS — F319 Bipolar disorder, unspecified: Secondary | ICD-10-CM | POA: Diagnosis not present

## 2021-05-03 DIAGNOSIS — Z681 Body mass index (BMI) 19 or less, adult: Secondary | ICD-10-CM | POA: Diagnosis not present

## 2021-05-05 DIAGNOSIS — Z79899 Other long term (current) drug therapy: Secondary | ICD-10-CM | POA: Diagnosis not present

## 2021-05-06 ENCOUNTER — Other Ambulatory Visit (INDEPENDENT_AMBULATORY_CARE_PROVIDER_SITE_OTHER): Payer: Self-pay

## 2021-05-06 DIAGNOSIS — G4733 Obstructive sleep apnea (adult) (pediatric): Secondary | ICD-10-CM | POA: Diagnosis not present

## 2021-05-06 DIAGNOSIS — R0683 Snoring: Secondary | ICD-10-CM | POA: Diagnosis not present

## 2021-05-06 DIAGNOSIS — D126 Benign neoplasm of colon, unspecified: Secondary | ICD-10-CM

## 2021-05-06 DIAGNOSIS — G4761 Periodic limb movement disorder: Secondary | ICD-10-CM | POA: Diagnosis not present

## 2021-05-09 ENCOUNTER — Telehealth (INDEPENDENT_AMBULATORY_CARE_PROVIDER_SITE_OTHER): Payer: Self-pay

## 2021-05-09 ENCOUNTER — Encounter (INDEPENDENT_AMBULATORY_CARE_PROVIDER_SITE_OTHER): Payer: Self-pay

## 2021-05-09 MED ORDER — CLENPIQ 10-3.5-12 MG-GM -GM/160ML PO SOLN: 1.0000 | Freq: Once | ORAL | 0 refills | Status: AC

## 2021-05-09 NOTE — Telephone Encounter (Signed)
[name], CMA  ?

## 2021-05-12 ENCOUNTER — Telehealth (INDEPENDENT_AMBULATORY_CARE_PROVIDER_SITE_OTHER): Payer: Self-pay

## 2021-05-12 MED ORDER — NA SULFATE-K SULFATE-MG SULF 17.5-3.13-1.6 GM/177ML PO SOLN: 1.0000 | ORAL | 0 refills | Status: DC

## 2021-05-12 NOTE — Telephone Encounter (Signed)
Jayley Hustead Ann Herberto Ledwell, CMA  ?

## 2021-05-17 ENCOUNTER — Encounter (HOSPITAL_COMMUNITY)
Admission: RE | Admit: 2021-05-17 | Discharge: 2021-05-17 | Disposition: A | Discharge status: Home / Self Care | Payer: BC Managed Care – PPO | Source: Ambulatory Visit | Attending: Gastroenterology | Admitting: Gastroenterology

## 2021-05-20 ENCOUNTER — Encounter (HOSPITAL_COMMUNITY): Payer: Self-pay | Admitting: Gastroenterology

## 2021-05-20 ENCOUNTER — Other Ambulatory Visit: Payer: Self-pay

## 2021-05-20 ENCOUNTER — Ambulatory Visit (HOSPITAL_COMMUNITY): Payer: BC Managed Care – PPO | Admitting: Anesthesiology

## 2021-05-20 ENCOUNTER — Ambulatory Visit (HOSPITAL_COMMUNITY)
Admission: RE | Admit: 2021-05-20 | Discharge: 2021-05-20 | Disposition: A | Discharge status: Home / Self Care | Payer: BC Managed Care – PPO | Attending: Gastroenterology | Admitting: Gastroenterology

## 2021-05-20 ENCOUNTER — Encounter (HOSPITAL_COMMUNITY)
Admission: RE | Disposition: A | Discharge status: Home / Self Care | Payer: Self-pay | Source: Home / Self Care | Attending: Gastroenterology

## 2021-05-20 DIAGNOSIS — I10 Essential (primary) hypertension: Secondary | ICD-10-CM | POA: Diagnosis not present

## 2021-05-20 DIAGNOSIS — D122 Benign neoplasm of ascending colon: Secondary | ICD-10-CM | POA: Insufficient documentation

## 2021-05-20 DIAGNOSIS — F419 Anxiety disorder, unspecified: Secondary | ICD-10-CM | POA: Insufficient documentation

## 2021-05-20 DIAGNOSIS — Z86010 Personal history of colonic polyps: Secondary | ICD-10-CM | POA: Insufficient documentation

## 2021-05-20 DIAGNOSIS — D123 Benign neoplasm of transverse colon: Secondary | ICD-10-CM | POA: Insufficient documentation

## 2021-05-20 DIAGNOSIS — J449 Chronic obstructive pulmonary disease, unspecified: Secondary | ICD-10-CM | POA: Diagnosis not present

## 2021-05-20 DIAGNOSIS — D120 Benign neoplasm of cecum: Secondary | ICD-10-CM | POA: Diagnosis not present

## 2021-05-20 DIAGNOSIS — J45909 Unspecified asthma, uncomplicated: Secondary | ICD-10-CM | POA: Insufficient documentation

## 2021-05-20 DIAGNOSIS — F319 Bipolar disorder, unspecified: Secondary | ICD-10-CM | POA: Insufficient documentation

## 2021-05-20 DIAGNOSIS — K219 Gastro-esophageal reflux disease without esophagitis: Secondary | ICD-10-CM | POA: Diagnosis not present

## 2021-05-20 DIAGNOSIS — F17200 Nicotine dependence, unspecified, uncomplicated: Secondary | ICD-10-CM | POA: Diagnosis not present

## 2021-05-20 DIAGNOSIS — G4730 Sleep apnea, unspecified: Secondary | ICD-10-CM | POA: Insufficient documentation

## 2021-05-20 DIAGNOSIS — K635 Polyp of colon: Secondary | ICD-10-CM

## 2021-05-20 DIAGNOSIS — F17210 Nicotine dependence, cigarettes, uncomplicated: Secondary | ICD-10-CM | POA: Diagnosis not present

## 2021-05-20 DIAGNOSIS — D124 Benign neoplasm of descending colon: Secondary | ICD-10-CM | POA: Insufficient documentation

## 2021-05-20 DIAGNOSIS — Z1211 Encounter for screening for malignant neoplasm of colon: Secondary | ICD-10-CM | POA: Diagnosis not present

## 2021-05-20 DIAGNOSIS — D126 Benign neoplasm of colon, unspecified: Secondary | ICD-10-CM

## 2021-05-20 HISTORY — PX: COLONOSCOPY WITH PROPOFOL: SHX5780

## 2021-05-20 HISTORY — PX: POLYPECTOMY: SHX5525

## 2021-05-20 LAB — HM COLONOSCOPY

## 2021-05-20 SURGERY — COLONOSCOPY WITH PROPOFOL
Anesthesia: General

## 2021-05-20 MED ORDER — LIDOCAINE HCL (PF) 2 % IJ SOLN
INTRAMUSCULAR | Status: AC
  Filled 2021-05-20: qty 5

## 2021-05-20 MED ORDER — LACTATED RINGERS IV SOLN: INTRAVENOUS | Status: DC

## 2021-05-20 MED ORDER — PROPOFOL 500 MG/50ML IV EMUL
INTRAVENOUS | Status: AC
  Filled 2021-05-20: qty 100

## 2021-05-20 MED ORDER — MIDAZOLAM HCL 2 MG/2ML IJ SOLN
INTRAMUSCULAR | Status: AC
  Filled 2021-05-20: qty 2

## 2021-05-20 MED ORDER — MIDAZOLAM HCL 2 MG/2ML IJ SOLN
1.0000 mg | INTRAMUSCULAR | Status: DC | PRN
  Administered 2021-05-20: 1 mg via INTRAVENOUS

## 2021-05-20 MED ORDER — PROPOFOL 10 MG/ML IV BOLUS
INTRAVENOUS | Status: DC | PRN
Start: 2021-05-20 — End: 2021-05-20
  Administered 2021-05-20: 100 mg via INTRAVENOUS

## 2021-05-20 MED ORDER — PROPOFOL 500 MG/50ML IV EMUL
INTRAVENOUS | Status: DC | PRN
  Administered 2021-05-20: 150 ug/kg/min via INTRAVENOUS

## 2021-05-20 NOTE — Anesthesia Preprocedure Evaluation (Signed)
Anesthesia Evaluation  Patient identified by MRN, date of birth, ID band Patient awake    Reviewed: Allergy & Precautions, H&P , NPO status , Patient's Chart, lab work & pertinent test results, reviewed documented beta blocker date and time   History of Anesthesia Complications (+) PONV and history of anesthetic complications  Airway Mallampati: II  TM Distance: >3 FB Neck ROM: full    Dental no notable dental hx.    Pulmonary neg pulmonary ROS, asthma , sleep apnea , COPD, Current Smoker and Patient abstained from smoking.,    Pulmonary exam normal breath sounds clear to auscultation       Cardiovascular Exercise Tolerance: Good hypertension, negative cardio ROS   Rhythm:regular Rate:Normal     Neuro/Psych  Headaches, Seizures -,  PSYCHIATRIC DISORDERS Anxiety Depression Bipolar Disorder  Neuromuscular disease negative neurological ROS  negative psych ROS   GI/Hepatic negative GI ROS, Neg liver ROS, GERD  Medicated,  Endo/Other  negative endocrine ROS  Renal/GU Renal diseasenegative Renal ROS  negative genitourinary   Musculoskeletal   Abdominal   Peds  Hematology negative hematology ROS (+) Blood dyscrasia, anemia ,   Anesthesia Other Findings   Reproductive/Obstetrics negative OB ROS                             Anesthesia Physical Anesthesia Plan  ASA: 3  Anesthesia Plan: General   Post-op Pain Management:    Induction:   PONV Risk Score and Plan: Propofol infusion  Airway Management Planned:   Additional Equipment:   Intra-op Plan:   Post-operative Plan:   Informed Consent: I have reviewed the patients History and Physical, chart, labs and discussed the procedure including the risks, benefits and alternatives for the proposed anesthesia with the patient or authorized representative who has indicated his/her understanding and acceptance.     Dental Advisory  Given  Plan Discussed with: CRNA  Anesthesia Plan Comments:         Anesthesia Quick Evaluation

## 2021-05-20 NOTE — Transfer of Care (Signed)
Immediate Anesthesia Transfer of Care Note  Patient: [name]  Procedure(s) Performed: COLONOSCOPY WITH PROPOFOL POLYPECTOMY  Patient Location: Short Stay  Anesthesia Type:General  Level of Consciousness: awake  Airway & Oxygen Therapy: Patient Spontanous Breathing  Post-op Assessment: Report given to RN  Post vital signs: Reviewed and stable  Last Vitals:  Vitals Value Taken Time  BP    Temp    Pulse    Resp    SpO2      Last Pain:  Vitals:   05/20/21 0958  TempSrc:   PainSc: 8          Complications: No notable events documented.

## 2021-05-20 NOTE — Discharge Instructions (Signed)
You are being discharged to home.  Resume your previous diet.  We are waiting for your pathology results.  Your physician has recommended a repeat colonoscopy in three years for surveillance.  
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
## 2021-05-20 NOTE — Anesthesia Postprocedure Evaluation (Signed)
Anesthesia Post Note  Patient: [name]  Procedure(s) Performed: COLONOSCOPY WITH PROPOFOL POLYPECTOMY  Patient location during evaluation: Short Stay Anesthesia Type: General Level of consciousness: awake and alert Pain management: pain level controlled Vital Signs Assessment: post-procedure vital signs reviewed and stable Respiratory status: spontaneous breathing Cardiovascular status: blood pressure returned to baseline and stable Postop Assessment: no apparent nausea or vomiting Anesthetic complications: no   No notable events documented.   Last Vitals:  Vitals:   05/20/21 0829 05/20/21 1034  BP: 122/89 95/68  Pulse: 92 75  Resp: 20 15  Temp: 36.7 C 36.7 C  SpO2: 97% 99%    Last Pain:  Vitals:   05/20/21 1034  TempSrc: Oral  PainSc: 0-No pain                 Caydyn Sprung

## 2021-05-20 NOTE — Op Note (Signed)
[facility] Patient Name: [name] Procedure Date: 05/20/2021 9:49 AM MRN: [id number] Date of Birth: [birth year] Attending MD: Maylon Peppers ,  CSN: [id number] Age: 46 Admit Type: Outpatient Procedure:                Colonoscopy Indications:              Surveillance: Personal history of adenomatous                            polyps on last colonoscopy > 5 years ago Providers:                Maylon Peppers, Janeece Riggers, RN, Hughie Closs RN,                            RN Referring MD:              Medicines:                Monitored Anesthesia Care Complications:            No immediate complications. Estimated Blood Loss:     Estimated blood loss: none. Procedure:                Pre-Anesthesia Assessment:                           - Prior to the procedure, a History and Physical                            was performed, and patient medications, allergies                            and sensitivities were reviewed. The patient's                            tolerance of previous anesthesia was reviewed.                           - The risks and benefits of the procedure and the                            sedation options and risks were discussed with the                            patient. All questions were answered and informed                            consent was obtained.                           - ASA Grade Assessment: III - A patient with severe                            systemic disease.                           After obtaining informed consent, the colonoscope  was passed under direct vision. Throughout the                            procedure, the patient's blood pressure, pulse, and                            oxygen saturations were monitored continuously. The                            PCF-HQ190L ([id number]) scope was introduced through                            the anus and advanced to the the cecum, identified                             by appendiceal orifice and ileocecal valve. The                            colonoscopy was performed without difficulty. The                            patient tolerated the procedure well. Scope In: 10:02:51 AM Scope Out: 10:30:02 AM Scope Withdrawal Time: 0 hours 21 minutes 12 seconds  Total Procedure Duration: 0 hours 27 minutes 11 seconds  Findings:      The perianal and digital rectal examinations were normal.      Five sessile and semi-sessile polyps were found in the descending colon,       transverse colon, ascending colon and ileocecal valve. The polyps were 3       to 10 mm in size. These polyps were removed with a cold snare. Resection       and retrieval were complete.      The retroflexed view of the distal rectum and anal verge was normal and       showed no anal or rectal abnormalities. Impression:               - Five 3 to 10 mm polyps in the descending colon,                            in the transverse colon, in the ascending colon and                            at the ileocecal valve, removed with a cold snare.                            Resected and retrieved.                           - The distal rectum and anal verge are normal on                            retroflexion view. Moderate Sedation:      Per Anesthesia Care Recommendation:           - Discharge patient to home (ambulatory).                           -  Resume previous diet.                           - Await pathology results.                           - Repeat colonoscopy in 3 years for surveillance. Procedure Code(s):        --- Professional ---                           [phone], Colonoscopy, flexible; with removal of                            tumor(s), polyp(s), or other lesion(s) by snare                            technique Diagnosis Code(s):        --- Professional ---                           Z86.010, Personal history of colonic polyps                           K63.5, Polyp of colon CPT  copyright 2019 [employer]. All rights reserved. The codes documented in this report are preliminary and upon coder review may  be revised to meet current compliance requirements. Maylon Peppers, MD Maylon Peppers,  05/20/2021 10:37:54 AM This report has been signed electronically. Number of Addenda: 0

## 2021-05-20 NOTE — Interval H&P Note (Signed)
History and Physical Interval Note:  05/20/2021 8:36 AM  [name]  has presented today for surgery, with the diagnosis of hx of adenomatous polyps.  The various methods of treatment have been discussed with the patient and family. After consideration of risks, benefits and other options for treatment, the patient has consented to  Procedure(s) with comments: COLONOSCOPY WITH PROPOFOL (N/A) - 10:10 / Asa 2 pt states she has had a hysterectomy as a surgical intervention.  The patient's history has been reviewed, patient examined, no change in status, stable for surgery.  I have reviewed the patient's chart and labs.  Questions were answered to the patient's satisfaction.     Maylon Peppers Mayorga

## 2021-05-22 DIAGNOSIS — G8929 Other chronic pain: Secondary | ICD-10-CM | POA: Diagnosis not present

## 2021-05-22 DIAGNOSIS — Z79899 Other long term (current) drug therapy: Secondary | ICD-10-CM | POA: Diagnosis not present

## 2021-05-22 DIAGNOSIS — F319 Bipolar disorder, unspecified: Secondary | ICD-10-CM | POA: Diagnosis not present

## 2021-05-22 DIAGNOSIS — M5441 Lumbago with sciatica, right side: Secondary | ICD-10-CM | POA: Diagnosis not present

## 2021-05-22 DIAGNOSIS — L301 Dyshidrosis [pompholyx]: Secondary | ICD-10-CM | POA: Diagnosis not present

## 2021-05-23 ENCOUNTER — Encounter (INDEPENDENT_AMBULATORY_CARE_PROVIDER_SITE_OTHER): Payer: Self-pay | Admitting: *Deleted

## 2021-05-23 DIAGNOSIS — R0683 Snoring: Secondary | ICD-10-CM | POA: Diagnosis not present

## 2021-05-23 DIAGNOSIS — M5441 Lumbago with sciatica, right side: Secondary | ICD-10-CM | POA: Diagnosis not present

## 2021-05-23 DIAGNOSIS — J449 Chronic obstructive pulmonary disease, unspecified: Secondary | ICD-10-CM | POA: Diagnosis not present

## 2021-05-23 DIAGNOSIS — G8929 Other chronic pain: Secondary | ICD-10-CM | POA: Diagnosis not present

## 2021-05-23 DIAGNOSIS — Z681 Body mass index (BMI) 19 or less, adult: Secondary | ICD-10-CM | POA: Diagnosis not present

## 2021-05-23 LAB — SURGICAL PATHOLOGY

## 2021-05-24 ENCOUNTER — Encounter (HOSPITAL_COMMUNITY): Payer: Self-pay | Admitting: Gastroenterology

## 2021-05-26 DIAGNOSIS — F411 Generalized anxiety disorder: Secondary | ICD-10-CM | POA: Diagnosis not present

## 2021-05-26 DIAGNOSIS — F5101 Primary insomnia: Secondary | ICD-10-CM | POA: Diagnosis not present

## 2021-05-26 DIAGNOSIS — F314 Bipolar disorder, current episode depressed, severe, without psychotic features: Secondary | ICD-10-CM | POA: Diagnosis not present

## 2021-05-27 DIAGNOSIS — E46 Unspecified protein-calorie malnutrition: Secondary | ICD-10-CM | POA: Diagnosis not present

## 2021-05-27 DIAGNOSIS — J40 Bronchitis, not specified as acute or chronic: Secondary | ICD-10-CM | POA: Diagnosis not present

## 2021-05-30 ENCOUNTER — Telehealth (INDEPENDENT_AMBULATORY_CARE_PROVIDER_SITE_OTHER): Payer: Self-pay

## 2021-05-30 DIAGNOSIS — K861 Other chronic pancreatitis: Secondary | ICD-10-CM | POA: Diagnosis not present

## 2021-05-30 NOTE — Telephone Encounter (Signed)
Patient called stating she is having issues with her peg tube. I advised to call her surgeon that placed it she states she did not know who that was. I advised that she needs to go to nearest hospital to have it looked at. She states understanding, but says she was referred to someone from this office and wanted to know their name. I did not see a referral by our office. Please advise if we did refer. Thanks

## 2021-06-01 NOTE — Telephone Encounter (Signed)
Patient left VM on 05/30/21 asking who we referred her to for her peg tube. I looked in her chart and didn't see where we referred her. I reached out to Dr Jenetta Downer and he said she insists on this but he never referred her for this. She had the initial tube placed by IR on 01/2021. I called patient and advised we were not the ones who referred her and I wasn't sure who she needed to speak to. She apologized for calling and said she had called the wrong office and she would call her primary GI office and ask them

## 2021-06-06 ENCOUNTER — Encounter: Payer: Self-pay | Admitting: Family Medicine

## 2021-06-06 ENCOUNTER — Other Ambulatory Visit: Payer: Self-pay | Admitting: Medical

## 2021-06-10 ENCOUNTER — Other Ambulatory Visit: Payer: Self-pay | Admitting: Medical

## 2021-06-10 DIAGNOSIS — K860 Alcohol-induced chronic pancreatitis: Secondary | ICD-10-CM | POA: Diagnosis not present

## 2021-06-10 DIAGNOSIS — M797 Fibromyalgia: Secondary | ICD-10-CM | POA: Diagnosis not present

## 2021-06-10 DIAGNOSIS — Z8719 Personal history of other diseases of the digestive system: Secondary | ICD-10-CM | POA: Diagnosis not present

## 2021-06-10 DIAGNOSIS — N3010 Interstitial cystitis (chronic) without hematuria: Secondary | ICD-10-CM | POA: Diagnosis not present

## 2021-06-10 DIAGNOSIS — Z79899 Other long term (current) drug therapy: Secondary | ICD-10-CM | POA: Diagnosis not present

## 2021-06-10 DIAGNOSIS — K219 Gastro-esophageal reflux disease without esophagitis: Secondary | ICD-10-CM | POA: Diagnosis not present

## 2021-06-10 DIAGNOSIS — F419 Anxiety disorder, unspecified: Secondary | ICD-10-CM | POA: Diagnosis not present

## 2021-06-10 DIAGNOSIS — G43909 Migraine, unspecified, not intractable, without status migrainosus: Secondary | ICD-10-CM | POA: Diagnosis not present

## 2021-06-10 DIAGNOSIS — F32A Depression, unspecified: Secondary | ICD-10-CM | POA: Diagnosis not present

## 2021-06-10 DIAGNOSIS — D649 Anemia, unspecified: Secondary | ICD-10-CM | POA: Diagnosis not present

## 2021-06-10 DIAGNOSIS — Z8619 Personal history of other infectious and parasitic diseases: Secondary | ICD-10-CM | POA: Diagnosis not present

## 2021-06-10 DIAGNOSIS — G8929 Other chronic pain: Secondary | ICD-10-CM | POA: Diagnosis not present

## 2021-06-10 DIAGNOSIS — F17210 Nicotine dependence, cigarettes, uncomplicated: Secondary | ICD-10-CM | POA: Diagnosis not present

## 2021-06-10 DIAGNOSIS — R3915 Urgency of urination: Secondary | ICD-10-CM | POA: Diagnosis not present

## 2021-06-10 DIAGNOSIS — J45909 Unspecified asthma, uncomplicated: Secondary | ICD-10-CM | POA: Diagnosis not present

## 2021-06-10 DIAGNOSIS — R350 Frequency of micturition: Secondary | ICD-10-CM | POA: Diagnosis not present

## 2021-06-10 DIAGNOSIS — N3281 Overactive bladder: Secondary | ICD-10-CM | POA: Diagnosis not present

## 2021-06-10 DIAGNOSIS — J449 Chronic obstructive pulmonary disease, unspecified: Secondary | ICD-10-CM | POA: Diagnosis not present

## 2021-06-14 DIAGNOSIS — Z931 Gastrostomy status: Secondary | ICD-10-CM | POA: Diagnosis not present

## 2021-06-14 DIAGNOSIS — R110 Nausea: Secondary | ICD-10-CM | POA: Diagnosis not present

## 2021-06-14 DIAGNOSIS — R112 Nausea with vomiting, unspecified: Secondary | ICD-10-CM | POA: Diagnosis not present

## 2021-06-14 DIAGNOSIS — E43 Unspecified severe protein-calorie malnutrition: Secondary | ICD-10-CM | POA: Diagnosis not present

## 2021-06-19 DIAGNOSIS — M5441 Lumbago with sciatica, right side: Secondary | ICD-10-CM | POA: Diagnosis not present

## 2021-06-19 DIAGNOSIS — Z79899 Other long term (current) drug therapy: Secondary | ICD-10-CM | POA: Diagnosis not present

## 2021-06-19 DIAGNOSIS — F319 Bipolar disorder, unspecified: Secondary | ICD-10-CM | POA: Diagnosis not present

## 2021-06-19 DIAGNOSIS — G8929 Other chronic pain: Secondary | ICD-10-CM | POA: Diagnosis not present

## 2021-06-19 DIAGNOSIS — F4321 Adjustment disorder with depressed mood: Secondary | ICD-10-CM | POA: Diagnosis not present

## 2021-06-22 DIAGNOSIS — Z888 Allergy status to other drugs, medicaments and biological substances status: Secondary | ICD-10-CM | POA: Diagnosis not present

## 2021-06-22 DIAGNOSIS — E43 Unspecified severe protein-calorie malnutrition: Secondary | ICD-10-CM | POA: Diagnosis not present

## 2021-06-22 DIAGNOSIS — Z881 Allergy status to other antibiotic agents status: Secondary | ICD-10-CM | POA: Diagnosis not present

## 2021-06-22 DIAGNOSIS — Z885 Allergy status to narcotic agent status: Secondary | ICD-10-CM | POA: Diagnosis not present

## 2021-06-22 DIAGNOSIS — Z431 Encounter for attention to gastrostomy: Secondary | ICD-10-CM | POA: Diagnosis not present

## 2021-06-22 DIAGNOSIS — K9423 Gastrostomy malfunction: Secondary | ICD-10-CM | POA: Diagnosis not present

## 2021-06-22 DIAGNOSIS — R112 Nausea with vomiting, unspecified: Secondary | ICD-10-CM | POA: Diagnosis not present

## 2021-06-24 DIAGNOSIS — N3010 Interstitial cystitis (chronic) without hematuria: Secondary | ICD-10-CM | POA: Diagnosis not present

## 2021-07-19 DIAGNOSIS — Z79899 Other long term (current) drug therapy: Secondary | ICD-10-CM | POA: Diagnosis not present

## 2021-07-19 DIAGNOSIS — M25511 Pain in right shoulder: Secondary | ICD-10-CM | POA: Diagnosis not present

## 2021-07-19 DIAGNOSIS — G8929 Other chronic pain: Secondary | ICD-10-CM | POA: Diagnosis not present

## 2021-07-19 DIAGNOSIS — M5441 Lumbago with sciatica, right side: Secondary | ICD-10-CM | POA: Diagnosis not present

## 2021-07-19 DIAGNOSIS — F319 Bipolar disorder, unspecified: Secondary | ICD-10-CM | POA: Diagnosis not present

## 2021-07-25 DIAGNOSIS — K9423 Gastrostomy malfunction: Secondary | ICD-10-CM | POA: Diagnosis not present

## 2021-07-25 DIAGNOSIS — Z431 Encounter for attention to gastrostomy: Secondary | ICD-10-CM | POA: Diagnosis not present

## 2021-07-25 DIAGNOSIS — R112 Nausea with vomiting, unspecified: Secondary | ICD-10-CM | POA: Diagnosis not present

## 2021-08-03 DIAGNOSIS — R636 Underweight: Secondary | ICD-10-CM | POA: Diagnosis not present

## 2021-08-03 DIAGNOSIS — Z713 Dietary counseling and surveillance: Secondary | ICD-10-CM | POA: Diagnosis not present

## 2021-08-03 DIAGNOSIS — Z931 Gastrostomy status: Secondary | ICD-10-CM | POA: Diagnosis not present

## 2021-08-16 DIAGNOSIS — Z79899 Other long term (current) drug therapy: Secondary | ICD-10-CM | POA: Diagnosis not present

## 2021-08-16 DIAGNOSIS — F319 Bipolar disorder, unspecified: Secondary | ICD-10-CM | POA: Diagnosis not present

## 2021-08-16 DIAGNOSIS — G8929 Other chronic pain: Secondary | ICD-10-CM | POA: Diagnosis not present

## 2021-08-16 DIAGNOSIS — G2581 Restless legs syndrome: Secondary | ICD-10-CM | POA: Diagnosis not present

## 2021-08-16 DIAGNOSIS — M5441 Lumbago with sciatica, right side: Secondary | ICD-10-CM | POA: Diagnosis not present

## 2021-08-23 DIAGNOSIS — F5101 Primary insomnia: Secondary | ICD-10-CM | POA: Diagnosis not present

## 2021-08-23 DIAGNOSIS — F319 Bipolar disorder, unspecified: Secondary | ICD-10-CM | POA: Diagnosis not present

## 2021-08-23 DIAGNOSIS — F411 Generalized anxiety disorder: Secondary | ICD-10-CM | POA: Diagnosis not present

## 2021-08-25 DIAGNOSIS — F4310 Post-traumatic stress disorder, unspecified: Secondary | ICD-10-CM | POA: Diagnosis not present

## 2021-08-25 DIAGNOSIS — Z1339 Encounter for screening examination for other mental health and behavioral disorders: Secondary | ICD-10-CM | POA: Diagnosis not present

## 2021-08-27 DIAGNOSIS — R109 Unspecified abdominal pain: Secondary | ICD-10-CM | POA: Diagnosis not present

## 2021-08-27 DIAGNOSIS — G8929 Other chronic pain: Secondary | ICD-10-CM | POA: Diagnosis not present

## 2021-08-27 DIAGNOSIS — S0990XA Unspecified injury of head, initial encounter: Secondary | ICD-10-CM | POA: Diagnosis not present

## 2021-08-27 DIAGNOSIS — S199XXA Unspecified injury of neck, initial encounter: Secondary | ICD-10-CM | POA: Diagnosis not present

## 2021-08-27 DIAGNOSIS — M533 Sacrococcygeal disorders, not elsewhere classified: Secondary | ICD-10-CM | POA: Diagnosis not present

## 2021-08-27 DIAGNOSIS — S0093XA Contusion of unspecified part of head, initial encounter: Secondary | ICD-10-CM | POA: Diagnosis not present

## 2021-08-27 DIAGNOSIS — S6391XA Sprain of unspecified part of right wrist and hand, initial encounter: Secondary | ICD-10-CM | POA: Diagnosis not present

## 2021-08-27 DIAGNOSIS — Z888 Allergy status to other drugs, medicaments and biological substances status: Secondary | ICD-10-CM | POA: Diagnosis not present

## 2021-08-27 DIAGNOSIS — M549 Dorsalgia, unspecified: Secondary | ICD-10-CM | POA: Diagnosis not present

## 2021-08-27 DIAGNOSIS — J984 Other disorders of lung: Secondary | ICD-10-CM | POA: Diagnosis not present

## 2021-08-27 DIAGNOSIS — S63501A Unspecified sprain of right wrist, initial encounter: Secondary | ICD-10-CM | POA: Diagnosis not present

## 2021-08-27 DIAGNOSIS — S6991XA Unspecified injury of right wrist, hand and finger(s), initial encounter: Secondary | ICD-10-CM | POA: Diagnosis not present

## 2021-08-27 DIAGNOSIS — S161XXA Strain of muscle, fascia and tendon at neck level, initial encounter: Secondary | ICD-10-CM | POA: Diagnosis not present

## 2021-08-27 DIAGNOSIS — Z886 Allergy status to analgesic agent status: Secondary | ICD-10-CM | POA: Diagnosis not present

## 2021-08-27 DIAGNOSIS — Z885 Allergy status to narcotic agent status: Secondary | ICD-10-CM | POA: Diagnosis not present

## 2021-08-27 DIAGNOSIS — F1020 Alcohol dependence, uncomplicated: Secondary | ICD-10-CM | POA: Diagnosis not present

## 2021-08-27 DIAGNOSIS — Z043 Encounter for examination and observation following other accident: Secondary | ICD-10-CM | POA: Diagnosis not present

## 2021-08-27 DIAGNOSIS — Z931 Gastrostomy status: Secondary | ICD-10-CM | POA: Diagnosis not present

## 2021-08-27 DIAGNOSIS — W109XXA Fall (on) (from) unspecified stairs and steps, initial encounter: Secondary | ICD-10-CM | POA: Diagnosis not present

## 2021-08-30 DIAGNOSIS — F1020 Alcohol dependence, uncomplicated: Secondary | ICD-10-CM | POA: Diagnosis not present

## 2021-08-30 DIAGNOSIS — F319 Bipolar disorder, unspecified: Secondary | ICD-10-CM | POA: Diagnosis not present

## 2021-08-30 DIAGNOSIS — F4310 Post-traumatic stress disorder, unspecified: Secondary | ICD-10-CM | POA: Diagnosis not present

## 2021-08-31 DIAGNOSIS — F1020 Alcohol dependence, uncomplicated: Secondary | ICD-10-CM | POA: Diagnosis not present

## 2021-08-31 DIAGNOSIS — F4310 Post-traumatic stress disorder, unspecified: Secondary | ICD-10-CM | POA: Diagnosis not present

## 2021-08-31 DIAGNOSIS — F319 Bipolar disorder, unspecified: Secondary | ICD-10-CM | POA: Diagnosis not present

## 2021-09-12 DIAGNOSIS — F319 Bipolar disorder, unspecified: Secondary | ICD-10-CM | POA: Diagnosis not present

## 2021-09-12 DIAGNOSIS — F4310 Post-traumatic stress disorder, unspecified: Secondary | ICD-10-CM | POA: Diagnosis not present

## 2021-09-12 DIAGNOSIS — F1020 Alcohol dependence, uncomplicated: Secondary | ICD-10-CM | POA: Diagnosis not present

## 2021-09-13 DIAGNOSIS — F319 Bipolar disorder, unspecified: Secondary | ICD-10-CM | POA: Diagnosis not present

## 2021-09-13 DIAGNOSIS — F1020 Alcohol dependence, uncomplicated: Secondary | ICD-10-CM | POA: Diagnosis not present

## 2021-09-13 DIAGNOSIS — F4310 Post-traumatic stress disorder, unspecified: Secondary | ICD-10-CM | POA: Diagnosis not present

## 2021-09-15 DIAGNOSIS — M5441 Lumbago with sciatica, right side: Secondary | ICD-10-CM | POA: Diagnosis not present

## 2021-09-15 DIAGNOSIS — Z79899 Other long term (current) drug therapy: Secondary | ICD-10-CM | POA: Diagnosis not present

## 2021-09-15 DIAGNOSIS — F319 Bipolar disorder, unspecified: Secondary | ICD-10-CM | POA: Diagnosis not present

## 2021-09-15 DIAGNOSIS — G8929 Other chronic pain: Secondary | ICD-10-CM | POA: Diagnosis not present

## 2021-09-15 DIAGNOSIS — Z681 Body mass index (BMI) 19 or less, adult: Secondary | ICD-10-CM | POA: Diagnosis not present

## 2021-09-27 ENCOUNTER — Other Ambulatory Visit: Payer: Self-pay

## 2021-09-27 ENCOUNTER — Encounter (HOSPITAL_COMMUNITY): Payer: Self-pay | Admitting: *Deleted

## 2021-09-27 ENCOUNTER — Emergency Department (HOSPITAL_COMMUNITY)
Admission: EM | Admit: 2021-09-27 | Discharge: 2021-09-27 | Disposition: A | Discharge status: Home / Self Care | Payer: BC Managed Care – PPO | Attending: Emergency Medicine | Admitting: Emergency Medicine

## 2021-09-27 DIAGNOSIS — R519 Headache, unspecified: Secondary | ICD-10-CM | POA: Insufficient documentation

## 2021-09-27 DIAGNOSIS — J449 Chronic obstructive pulmonary disease, unspecified: Secondary | ICD-10-CM | POA: Insufficient documentation

## 2021-09-27 DIAGNOSIS — R1084 Generalized abdominal pain: Secondary | ICD-10-CM | POA: Diagnosis not present

## 2021-09-27 DIAGNOSIS — R110 Nausea: Secondary | ICD-10-CM | POA: Insufficient documentation

## 2021-09-27 DIAGNOSIS — R109 Unspecified abdominal pain: Secondary | ICD-10-CM | POA: Diagnosis not present

## 2021-09-27 DIAGNOSIS — Z7951 Long term (current) use of inhaled steroids: Secondary | ICD-10-CM | POA: Diagnosis not present

## 2021-09-27 LAB — CBC WITH DIFFERENTIAL/PLATELET
Abs Immature Granulocytes: 0.04 10*3/uL (ref 0.00–0.07)
Basophils Absolute: 0.1 10*3/uL (ref 0.0–0.1)
Basophils Relative: 1 %
Eosinophils Absolute: 0.1 10*3/uL (ref 0.0–0.5)
Eosinophils Relative: 1 %
HCT: 34.8 % — ABNORMAL LOW (ref 36.0–46.0)
Hemoglobin: 11.9 g/dL — ABNORMAL LOW (ref 12.0–15.0)
Immature Granulocytes: 0 %
Lymphocytes Relative: 32 %
Lymphs Abs: 3.3 10*3/uL (ref 0.7–4.0)
MCH: 31.6 pg (ref 26.0–34.0)
MCHC: 34.2 g/dL (ref 30.0–36.0)
MCV: 92.3 fL (ref 80.0–100.0)
Monocytes Absolute: 0.5 10*3/uL (ref 0.1–1.0)
Monocytes Relative: 5 %
Neutro Abs: 6.6 10*3/uL (ref 1.7–7.7)
Neutrophils Relative %: 61 %
Platelets: 429 10*3/uL — ABNORMAL HIGH (ref 150–400)
RBC: 3.77 MIL/uL — ABNORMAL LOW (ref 3.87–5.11)
RDW: 12.4 % (ref 11.5–15.5)
WBC: 10.6 10*3/uL — ABNORMAL HIGH (ref 4.0–10.5)
nRBC: 0 % (ref 0.0–0.2)

## 2021-09-27 LAB — COMPREHENSIVE METABOLIC PANEL
ALT: 14 U/L (ref 0–44)
AST: 19 U/L (ref 15–41)
Albumin: 4 g/dL (ref 3.5–5.0)
Alkaline Phosphatase: 82 U/L (ref 38–126)
Anion gap: 7 (ref 5–15)
BUN: 8 mg/dL (ref 6–20)
CO2: 23 mmol/L (ref 22–32)
Calcium: 9.3 mg/dL (ref 8.9–10.3)
Chloride: 106 mmol/L (ref 98–111)
Creatinine, Ser: 0.61 mg/dL (ref 0.44–1.00)
GFR, Estimated: >60 mL/min (ref >60)
Glucose, Bld: 98 mg/dL (ref 70–99)
Potassium: 3.5 mmol/L (ref 3.5–5.1)
Sodium: 136 mmol/L (ref 135–145)
Total Bilirubin: 0.4 mg/dL (ref 0.3–1.2)
Total Protein: 7.4 g/dL (ref 6.5–8.1)

## 2021-09-27 LAB — URINALYSIS, ROUTINE W REFLEX MICROSCOPIC
Bilirubin Urine: NEGATIVE
Glucose, UA: NEGATIVE mg/dL
Hgb urine dipstick: NEGATIVE
Ketones, ur: NEGATIVE mg/dL
Leukocytes,Ua: NEGATIVE
Nitrite: NEGATIVE
Protein, ur: NEGATIVE mg/dL
Specific Gravity, Urine: 1.005 (ref 1.005–1.030)
pH: 7 (ref 5.0–8.0)

## 2021-09-27 LAB — MAGNESIUM: Magnesium: 1.9 mg/dL (ref 1.7–2.4)

## 2021-09-27 LAB — LIPASE, BLOOD: Lipase: 30 U/L (ref 11–51)

## 2021-09-27 MED ORDER — LACTATED RINGERS IV BOLUS
1000.0000 mL | Freq: Once | INTRAVENOUS | Status: AC
  Administered 2021-09-27: 1000 mL via INTRAVENOUS

## 2021-09-27 MED ORDER — HYDROCODONE-ACETAMINOPHEN 5-325 MG PO TABS
1.0000 | ORAL_TABLET | Freq: Once | ORAL | Status: AC
  Administered 2021-09-27: 1 via ORAL
  Filled 2021-09-27: qty 1

## 2021-09-27 MED ORDER — HYDROMORPHONE HCL 1 MG/ML IJ SOLN
0.5000 mg | Freq: Once | INTRAMUSCULAR | Status: AC
Start: 2021-09-27 — End: 2021-09-27
  Administered 2021-09-27: 0.5 mg via INTRAVENOUS
  Filled 2021-09-27: qty 0.5

## 2021-09-27 NOTE — ED Triage Notes (Signed)
Pt brought in by [facility] for c/o abdominal pain and lower back pain; pt states she has a stimulator for her interstitial cystitis but her spouse kicked her out and she does not have the charger to charge unit

## 2021-09-27 NOTE — Discharge Instructions (Addendum)
You were seen in the emergency department for evaluation of your migraine headache as well as your nutrition status and abdominal pain.  Your electrolytes and blood counts are within normal limits and at your baseline.  Your headache is improved with the fluids and Dilaudid you are given.  You do not have any urine retention problems even with your interstitial cystitis pacemaker.    **You will need to follow-up with your specialist for possible charger**    I advise you to be escorted by the police to your residency to obtain your charger.  Please make sure you are giving yourself tube feedings as well as eating as tolerated.  Please make sure you continue to drink plenty of fluids, mainly water.  If you have any concern, new or worsening symptoms, please return to the nearest emergency department for evaluation.  Contact a doctor if: Your belly pain changes or gets worse. You are not hungry, or you lose weight without trying. You are having trouble pooping (constipated) or have watery poop (diarrhea) for more than 2-3 days. You have pain when you pee or poop. Your belly pain wakes you up at night. Your pain gets worse with meals, after eating, or with certain foods. You are vomiting and cannot keep anything down. You have a fever. You have blood in your pee. Get help right away if: Your pain does not go away as soon as your doctor says it should. You cannot stop vomiting. Your pain is only in areas of your belly, such as the right side or the left lower part of the belly. You have bloody or black poop, or poop that looks like tar. You have very bad pain, cramping, or bloating in your belly. You have signs of not having enough fluid or water in your body (dehydration), such as: Dark pee, very little pee, or no pee. Cracked lips. Dry mouth. Sunken eyes. Sleepiness. Weakness. You have trouble breathing or chest pain.

## 2021-09-27 NOTE — ED Provider Notes (Incomplete)
[facility] EMERGENCY DEPARTMENT Provider Note   CSN: [id number] Arrival date & time: 09/27/21  7782     History {Add pertinent medical, surgical, social history, OB history to HPI:1} Chief Complaint  Patient presents with   Abdominal Pain    [name] is a 46 y.o. female.   Abdominal Pain      Home Medications Prior to Admission medications   Medication Sig Start Date End Date Taking? Authorizing Provider  acetaminophen (TYLENOL) 500 MG tablet Take 1,000 mg by mouth every 6 (six) hours as needed for mild pain, fever or headache.    [provider]  albuterol (PROAIR HFA) 108 (90 Base) MCG/ACT inhaler INHALE 2 PUFFS EVERY 6 HOURS AS NEEDED FOR SHORTNESS OF BREATH AND WHEEZING. Patient taking differently: Inhale 2 puffs into the lungs every 6 (six) hours as needed for shortness of breath or wheezing. 02/27/20   [name], DO  clonazePAM (KLONOPIN) 0.5 MG tablet Take 1 tablet (0.5 mg total) by mouth 3 (three) times daily as needed for anxiety. 05/01/19   [name], DO  cyclobenzaprine (FLEXERIL) 5 MG tablet Take 1 tablet (5 mg total) by mouth 3 (three) times daily as needed for muscle spasms. 05/21/20   Mordecai Rasmussen, MD  estradiol (ESTRACE) 0.1 MG/GM vaginal cream Place 1 Applicatorful vaginally every 3 (three) days. 11/21/19   [provider]  estradiol (ESTRACE) 2 MG tablet Take 2 mg by mouth daily.    [provider]  fluticasone (FLONASE) 50 MCG/ACT nasal spray Place 2 sprays into both nostrils daily. Patient taking differently: Place 2 sprays into both nostrils daily as needed for allergies. 04/22/19   [name], DO  gabapentin (NEURONTIN) 800 MG tablet Take 0.5 tablets (400 mg total) by mouth in the morning, at noon, in the evening, and at bedtime. 02/01/21   Barb Merino, MD  HYDROcodone-acetaminophen (NORCO/VICODIN) 5-325 MG tablet Take 1 tablet by mouth 3 (three) times daily as needed. 02/01/21   Barb Merino, MD  hydrOXYzine (ATARAX/VISTARIL) 50 MG tablet Take 50 mg by mouth at bedtime. 08/26/18   [provider]  Hyoscyamine Sulfate SL 0.125 MG SUBL Take 0.125 mg by mouth every 4 (four) hours as needed (for cramping). 11/04/19   [provider]  lipase/protease/amylase (CREON) 36000 UNITS CPEP capsule Take 1 capsule (36,000 Units total) by mouth 4 (four) times daily -  with meals and at bedtime. 04/26/21   Carlan, Chelsea L, NP  mirtazapine (REMERON) 15 MG tablet Take 15 mg by mouth at bedtime. 11/24/20   [provider]  ondansetron (ZOFRAN) 4 MG tablet Take 1 tablet (4 mg total) by mouth every 8 (eight) hours as needed for nausea or vomiting. 02/09/21   Marcello Fennel, PA-C  ondansetron (ZOFRAN-ODT) 8 MG disintegrating tablet Take 1 tablet (8 mg total) by mouth every 8 (eight) hours as needed for nausea or vomiting. 42/35/36   Delora Fuel, MD  oxybutynin (DITROPAN XL) 15 MG 24 hr tablet Take 15 mg by mouth daily. 09/16/19   [provider]  pantoprazole (PROTONIX) 40 MG tablet Take 1 tablet (40 mg total) by mouth daily. 04/26/21   Carlan, Chelsea L, NP  promethazine (PHENERGAN) 25 MG tablet TAKE (1) TABLET EVERY SIX HOURS AS NEEDED FOR NAUSEA AND VOMITING. 02/14/21   Carollee Herter, Alferd Apa, DO  QUEtiapine (SEROQUEL) 400 MG tablet Take 400 mg by mouth at bedtime.  09/16/18   [provider]  RESTASIS  0.05 % ophthalmic emulsion Place 1 drop into both eyes 2 (two) times daily. 02/26/20   [provider]  SYMBICORT 80-4.5 MCG/ACT inhaler INHALE 2 PUFFS INTO THE LUNGS TWICE DAILY. 06/13/21   Saguier, Percell Miller, PA-C      Allergies    Abilify [aripiprazole], Amitriptyline, Metoclopramide hcl, Propoxyphene, Toradol [ketorolac tromethamine], Tramadol, Ambien [zolpidem tartrate], Eszopiclone, Varenicline, Buprenorphine hcl, Demerol [meperidine], Emetrol, and Morphine and related    Review of Systems   Review of Systems  Gastrointestinal:  Positive for  abdominal pain.    Physical Exam Updated Vital Signs BP 113/78   Pulse 79   Temp 97.8 F (36.6 C) (Oral)   Ht '5\' 4"'$  (1.626 m)   Wt 36.3 kg   SpO2 99%   BMI 13.73 kg/m  Physical Exam  ED Results / Procedures / Treatments   Labs (all labs ordered are listed, but only abnormal results are displayed) Labs Reviewed  CBC WITH DIFFERENTIAL/PLATELET - Abnormal; Notable for the following components:      Result Value   WBC 10.6 (*)    RBC 3.77 (*)    Hemoglobin 11.9 (*)    HCT 34.8 (*)    Platelets 429 (*)    All other components within normal limits  LIPASE, BLOOD  COMPREHENSIVE METABOLIC PANEL  URINALYSIS, ROUTINE W REFLEX MICROSCOPIC  MAGNESIUM    EKG None  Radiology No results found.  Procedures Procedures  {Document cardiac monitor, telemetry assessment procedure when appropriate:1}  Medications Ordered in ED Medications  lactated ringers bolus 1,000 mL (1,000 mLs Intravenous New Bag/Given 09/27/21 1043)    ED Course/ Medical Decision Making/ A&P                           Medical Decision Making Amount and/or Complexity of Data Reviewed Labs: ordered.  Risk Prescription drug management.   ***   I discussed this case with my attending physician who cosigned this note including patient's presenting symptoms, physical exam, and planned diagnostics and interventions. Attending physician stated agreement with plan or made changes to plan which were implemented.   Attending physician assessed patient at bedside.  {Document critical care time when appropriate:1} {Document review of labs and clinical decision tools ie heart score, Chads2Vasc2 etc:1}  {Document your independent review of radiology images, and any outside records:1} {Document your discussion with family members, caretakers, and with consultants:1} {Document social determinants of health affecting pt's care:1} {Document your decision making why or why not admission, treatments were  needed:1} Final Clinical Impression(s) / ED Diagnoses Final diagnoses:  None    Rx / DC Orders ED Discharge Orders     None

## 2021-09-27 NOTE — ED Notes (Signed)
EDP made aware of pts pain 

## 2021-09-27 NOTE — ED Notes (Signed)
TOC consulted for homelessness issues. CSW attempted to speak with pt in the room. Pt was curled up in pain and unable to speak. CSW left homelessness resources list at bedside for pt. CSW updated pts RN and PA that pts spouse is not able to kick pt out without filing an eviction notice. CSW explained that pt will need to return home and get her belongings. If pts spouse will not allow her in pt will need to reach out to police for assistance.

## 2021-09-27 NOTE — ED Notes (Signed)
Per nurse report, bladder scan done and no urine noted in the bladder.

## 2021-09-28 NOTE — ED Provider Notes (Incomplete)
[facility] EMERGENCY DEPARTMENT Provider Note   CSN: [id number] Arrival date & time: 09/27/21  1962     History Chief Complaint  Patient presents with  . Abdominal Pain    [name] is a 46 y.o. female with history of G-tube, bladder pacemaker, COPD, fibromyalgia, bipolar type I, IBS, migraine headaches presents the emergency department for evaluation of possible dehydration and malnutrition.  Patient reports that she was recently kicked out of her home.  Before this, she reports that her husband was not paying for her feeding tube and feeds supplies and she was not able to receive any tube feedings.  She reports that she was finally received Medicaid and they sent her the wrong supplies so she is still not able to receive tube feedings.  She has however been able to tolerate p.o. foods such as broth, mashed potatoes, etc.  She is concerned that she may have some electrolyte abnormalities or dehydration given her not receiving her tube feedings.  Additionally, she is having some urinary pain because her bladder pacemaker battery is dead because she lost the charger.  She reports that she does have a charger coming in next week.  She denies any fever, vomiting, chest pain, shortness of breath, blurry vision, dysuria, hematuria.   Abdominal Pain Associated symptoms: nausea   Associated symptoms: no chest pain, no chills, no constipation, no diarrhea, no dysuria, no fever, no hematuria, no shortness of breath and no vomiting        Home Medications Prior to Admission medications   Medication Sig Start Date End Date Taking? Authorizing Provider  acetaminophen (TYLENOL) 500 MG tablet Take 1,000 mg by mouth every 6 (six) hours as needed for mild pain, fever or headache.    [provider]  albuterol (PROAIR HFA) 108 (90 Base) MCG/ACT inhaler INHALE 2 PUFFS EVERY 6 HOURS AS NEEDED FOR SHORTNESS OF BREATH AND WHEEZING. Patient taking differently: Inhale 2 puffs into the lungs every 6  (six) hours as needed for shortness of breath or wheezing. 02/27/20   [name], DO  clonazePAM (KLONOPIN) 0.5 MG tablet Take 1 tablet (0.5 mg total) by mouth 3 (three) times daily as needed for anxiety. 05/01/19   [name], DO  cyclobenzaprine (FLEXERIL) 5 MG tablet Take 1 tablet (5 mg total) by mouth 3 (three) times daily as needed for muscle spasms. 05/21/20   Mordecai Rasmussen, MD  estradiol (ESTRACE) 0.1 MG/GM vaginal cream Place 1 Applicatorful vaginally every 3 (three) days. 11/21/19   [provider]  estradiol (ESTRACE) 2 MG tablet Take 2 mg by mouth daily.    [provider]  fluticasone (FLONASE) 50 MCG/ACT nasal spray Place 2 sprays into both nostrils daily. Patient taking differently: Place 2 sprays into both nostrils daily as needed for allergies. 04/22/19   [name], DO  gabapentin (NEURONTIN) 800 MG tablet Take 0.5 tablets (400 mg total) by mouth in the morning, at noon, in the evening, and at bedtime. 02/01/21   Barb Merino, MD  HYDROcodone-acetaminophen (NORCO/VICODIN) 5-325 MG tablet Take 1 tablet by mouth 3 (three) times daily as needed. 02/01/21   Barb Merino, MD  hydrOXYzine (ATARAX/VISTARIL) 50 MG tablet Take 50 mg by mouth at bedtime. 08/26/18   [provider]  Hyoscyamine Sulfate SL 0.125 MG SUBL Take 0.125 mg by mouth every 4 (four) hours as needed (for cramping). 11/04/19   [provider]  lipase/protease/amylase (CREON) 36000 UNITS CPEP capsule Take 1  capsule (36,000 Units total) by mouth 4 (four) times daily -  with meals and at bedtime. 04/26/21   Carlan, Chelsea L, NP  mirtazapine (REMERON) 15 MG tablet Take 15 mg by mouth at bedtime. 11/24/20   [provider]  ondansetron (ZOFRAN) 4 MG tablet Take 1 tablet (4 mg total) by mouth every 8 (eight) hours as needed for nausea or vomiting. 02/09/21   Marcello Fennel, PA-C  ondansetron (ZOFRAN-ODT) 8 MG disintegrating tablet Take 1 tablet (8 mg  total) by mouth every 8 (eight) hours as needed for nausea or vomiting. 97/41/63   Delora Fuel, MD  oxybutynin (DITROPAN XL) 15 MG 24 hr tablet Take 15 mg by mouth daily. 09/16/19   [provider]  pantoprazole (PROTONIX) 40 MG tablet Take 1 tablet (40 mg total) by mouth daily. 04/26/21   Carlan, Chelsea L, NP  promethazine (PHENERGAN) 25 MG tablet TAKE (1) TABLET EVERY SIX HOURS AS NEEDED FOR NAUSEA AND VOMITING. 02/14/21   Carollee Herter, Alferd Apa, DO  QUEtiapine (SEROQUEL) 400 MG tablet Take 400 mg by mouth at bedtime.  09/16/18   [provider]  RESTASIS 0.05 % ophthalmic emulsion Place 1 drop into both eyes 2 (two) times daily. 02/26/20   [provider]  SYMBICORT 80-4.5 MCG/ACT inhaler INHALE 2 PUFFS INTO THE LUNGS TWICE DAILY. 06/13/21   Saguier, Percell Miller, PA-C      Allergies    Abilify [aripiprazole], Amitriptyline, Metoclopramide hcl, Propoxyphene, Toradol [ketorolac tromethamine], Tramadol, Ambien [zolpidem tartrate], Eszopiclone, Varenicline, Buprenorphine hcl, Demerol [meperidine], Emetrol, and Morphine and related    Review of Systems   Review of Systems  Constitutional:  Negative for chills and fever.  Respiratory:  Negative for shortness of breath.   Cardiovascular:  Negative for chest pain.  Gastrointestinal:  Positive for abdominal pain and nausea. Negative for constipation, diarrhea and vomiting.  Genitourinary:  Negative for dysuria and hematuria.    Physical Exam Updated Vital Signs BP 113/78   Pulse 79   Temp 97.8 F (36.6 C) (Oral)   Ht '5\' 4"'$  (1.626 m)   Wt 36.3 kg   SpO2 99%   BMI 13.73 kg/m  Physical Exam Vitals and nursing note reviewed.  Constitutional:      Appearance: Normal appearance. She is not toxic-appearing.     Comments: Cachetic, tearful  HENT:     Head: Normocephalic and atraumatic.     Mouth/Throat:     Mouth: Mucous membranes are moist.     Pharynx: Oropharynx is clear.  Eyes:     General: No scleral  icterus. Cardiovascular:     Rate and Rhythm: Normal rate and regular rhythm.  Pulmonary:     Effort: Pulmonary effort is normal. No respiratory distress.     Comments: Diffuse wheezing. Patient has h/o COPD.  Patient is speaking in full sentences with ease and is satting well on room air with any increased work of breathing.  No respiratory distress, accessory muscle use, nasal flaring, tripoding, or cyanosis present. Abdominal:     General: Abdomen is flat. Bowel sounds are normal.     Palpations: Abdomen is soft.     Comments: Tattoos present otherwise no other abnormal skin changes noted.  G-tube is present in the left periumbilical region.  There is no surrounding erythema or discharge noted.  Tube appears to be in place.  Normal active bowel sounds.  Abdomen is soft.  Musculoskeletal:        General: No deformity.  Cervical back: Normal range of motion.  Skin:    General: Skin is warm and dry.  Neurological:     General: No focal deficit present.     Mental Status: She is alert. Mental status is at baseline.     Cranial Nerves: No cranial nerve deficit.     ED Results / Procedures / Treatments   Labs (all labs ordered are listed, but only abnormal results are displayed) Labs Reviewed  CBC WITH DIFFERENTIAL/PLATELET - Abnormal; Notable for the following components:      Result Value   WBC 10.6 (*)    RBC 3.77 (*)    Hemoglobin 11.9 (*)    HCT 34.8 (*)    Platelets 429 (*)    All other components within normal limits  LIPASE, BLOOD  COMPREHENSIVE METABOLIC PANEL  URINALYSIS, ROUTINE W REFLEX MICROSCOPIC  MAGNESIUM    EKG None  Radiology No results found.  Procedures Procedures   Medications Ordered in ED Medications  lactated ringers bolus 1,000 mL (0 mLs Intravenous Stopped 09/27/21 1329)  HYDROcodone-acetaminophen (NORCO/VICODIN) 5-325 MG per tablet 1 tablet (1 tablet Oral Given 09/27/21 1243)  HYDROmorphone (DILAUDID) injection 0.5 mg (0.5 mg Intravenous  Given 09/27/21 1455)    ED Course/ Medical Decision Making/ A&P                           Medical Decision Making Amount and/or Complexity of Data Reviewed Labs: ordered.  Risk Prescription drug management.   46 year old female presents the emergency department for evaluation of possible malnutrition/dehydration.  Differential diagnosis includes was not limited to electrolyte abnormality versus dehydration.  Vital signs are unremarkable.  Patient normotensive, afebrile, normal pulse rate, satting well room air without increased work of breathing.  Physical exam is pertinent for a small, thin cachectic appearing patient is tearful.  I do not see any surrounding erythema to her G-tube placement.  Abdomen is soft with normal active bowel sounds.  She does not appear dehydrated.  She has moist mucous membranes.  We will order labs to check her hydration and electrolytes.  I independently reviewed and interpreted the patient's labs.  Urinalysis shows straw-colored urine with dilute specific gravity.  No ketones or protein.  Not consistent with dehydration.  Normal lipase.  Normal magnesium.  CMP without electrolyte or LFT abnormality.  CBC shows slight increase in white blood cell count at 10.6 as well as a slight decrease in her hemoglobin 11.9 this appears not far from her baseline around 12.4.  Patient does have thrombocytosis which is present on her previous CBCs as well.  On reevaluation, the patient reports that she now has a migraine.  She reports that this is her typical migraine that she gets weekly.  Recommended giving her some fluids for rehydration.  Patient reports that she takes Vicodin "like candy" at home.  We will order her 1 Vicodin.  On reevaluation, patient reports that she still has a migraine and would like some Dilaudid because that helps with her pain.  We will order 0.5 mg of Dilaudid given the patient's small size.  Attending assessed at bedside and agrees to plan.  No  additional interventions needed at this time.  Given the patient's reassuring labs, she does not appear to be dehydrated or have any significant electrolyte abnormalities.  Patient would like to leave after her dose of Dilaudid as she would like to go home without any pain.  Mother is not at bedside.  I discussed with her that she will need to follow-up with her specialist at [facility] given her medical complexity.  They also may have a spare charger for her bladder pacemaker.  Since patient is kicked out of her home, did consult TOC.  Recommended the police to escort her into the homes that she may obtain her medical devices and belongings.  We discussed return precautions and red flag symptoms.  Patient verbalized understanding and agrees to plan.  Patient is stable to be discharged home in good condition.  I discussed this case with my attending physician who cosigned this note including patient's presenting symptoms, physical exam, and planned diagnostics and interventions. Attending physician stated agreement with plan or made changes to plan which were implemented.   Attending physician assessed patient at bedside.   Final Clinical Impression(s) / ED Diagnoses Final diagnoses:  Bad headache  Generalized abdominal pain    Rx / DC Orders ED Discharge Orders     None

## 2021-09-29 DIAGNOSIS — E43 Unspecified severe protein-calorie malnutrition: Secondary | ICD-10-CM | POA: Diagnosis not present

## 2021-09-29 DIAGNOSIS — R112 Nausea with vomiting, unspecified: Secondary | ICD-10-CM | POA: Diagnosis not present

## 2021-09-29 DIAGNOSIS — R634 Abnormal weight loss: Secondary | ICD-10-CM | POA: Diagnosis not present

## 2021-10-04 DIAGNOSIS — R109 Unspecified abdominal pain: Secondary | ICD-10-CM | POA: Diagnosis not present

## 2021-10-04 DIAGNOSIS — F1020 Alcohol dependence, uncomplicated: Secondary | ICD-10-CM | POA: Diagnosis not present

## 2021-10-04 DIAGNOSIS — K861 Other chronic pancreatitis: Secondary | ICD-10-CM | POA: Diagnosis not present

## 2021-10-04 DIAGNOSIS — Z681 Body mass index (BMI) 19 or less, adult: Secondary | ICD-10-CM | POA: Diagnosis not present

## 2021-10-04 DIAGNOSIS — E46 Unspecified protein-calorie malnutrition: Secondary | ICD-10-CM | POA: Diagnosis not present

## 2021-10-04 DIAGNOSIS — F319 Bipolar disorder, unspecified: Secondary | ICD-10-CM | POA: Diagnosis not present

## 2021-10-04 DIAGNOSIS — F4310 Post-traumatic stress disorder, unspecified: Secondary | ICD-10-CM | POA: Diagnosis not present

## 2021-10-12 DIAGNOSIS — F4310 Post-traumatic stress disorder, unspecified: Secondary | ICD-10-CM | POA: Diagnosis not present

## 2021-10-12 DIAGNOSIS — F319 Bipolar disorder, unspecified: Secondary | ICD-10-CM | POA: Diagnosis not present

## 2021-10-13 DIAGNOSIS — M5441 Lumbago with sciatica, right side: Secondary | ICD-10-CM | POA: Diagnosis not present

## 2021-10-13 DIAGNOSIS — Z789 Other specified health status: Secondary | ICD-10-CM | POA: Diagnosis not present

## 2021-10-13 DIAGNOSIS — G8929 Other chronic pain: Secondary | ICD-10-CM | POA: Diagnosis not present

## 2021-10-13 DIAGNOSIS — Z79899 Other long term (current) drug therapy: Secondary | ICD-10-CM | POA: Diagnosis not present

## 2021-10-24 ENCOUNTER — Encounter (INDEPENDENT_AMBULATORY_CARE_PROVIDER_SITE_OTHER): Payer: Self-pay | Admitting: Gastroenterology

## 2021-10-24 ENCOUNTER — Ambulatory Visit (INDEPENDENT_AMBULATORY_CARE_PROVIDER_SITE_OTHER): Payer: BC Managed Care – PPO | Admitting: Gastroenterology

## 2021-11-02 ENCOUNTER — Other Ambulatory Visit: Payer: Self-pay | Admitting: Medical

## 2021-11-04 ENCOUNTER — Other Ambulatory Visit: Payer: Self-pay | Admitting: Medical

## 2021-11-15 DIAGNOSIS — M5441 Lumbago with sciatica, right side: Secondary | ICD-10-CM | POA: Diagnosis not present

## 2021-11-15 DIAGNOSIS — F319 Bipolar disorder, unspecified: Secondary | ICD-10-CM | POA: Diagnosis not present

## 2021-11-15 DIAGNOSIS — J449 Chronic obstructive pulmonary disease, unspecified: Secondary | ICD-10-CM | POA: Diagnosis not present

## 2021-11-15 DIAGNOSIS — G8929 Other chronic pain: Secondary | ICD-10-CM | POA: Diagnosis not present

## 2021-11-15 DIAGNOSIS — Z79899 Other long term (current) drug therapy: Secondary | ICD-10-CM | POA: Diagnosis not present

## 2021-11-17 DIAGNOSIS — Z79899 Other long term (current) drug therapy: Secondary | ICD-10-CM | POA: Diagnosis not present

## 2021-11-18 DIAGNOSIS — F319 Bipolar disorder, unspecified: Secondary | ICD-10-CM | POA: Diagnosis not present

## 2021-11-18 DIAGNOSIS — F411 Generalized anxiety disorder: Secondary | ICD-10-CM | POA: Diagnosis not present

## 2021-11-18 DIAGNOSIS — F5101 Primary insomnia: Secondary | ICD-10-CM | POA: Diagnosis not present

## 2021-12-01 DIAGNOSIS — R252 Cramp and spasm: Secondary | ICD-10-CM | POA: Diagnosis not present

## 2021-12-01 DIAGNOSIS — R109 Unspecified abdominal pain: Secondary | ICD-10-CM | POA: Diagnosis not present

## 2021-12-01 DIAGNOSIS — Z0000 Encounter for general adult medical examination without abnormal findings: Secondary | ICD-10-CM | POA: Diagnosis not present

## 2021-12-01 DIAGNOSIS — Z681 Body mass index (BMI) 19 or less, adult: Secondary | ICD-10-CM | POA: Diagnosis not present

## 2021-12-01 DIAGNOSIS — J40 Bronchitis, not specified as acute or chronic: Secondary | ICD-10-CM | POA: Diagnosis not present

## 2021-12-16 DIAGNOSIS — Z79899 Other long term (current) drug therapy: Secondary | ICD-10-CM | POA: Diagnosis not present

## 2021-12-16 DIAGNOSIS — M5441 Lumbago with sciatica, right side: Secondary | ICD-10-CM | POA: Diagnosis not present

## 2021-12-16 DIAGNOSIS — J449 Chronic obstructive pulmonary disease, unspecified: Secondary | ICD-10-CM | POA: Diagnosis not present

## 2021-12-16 DIAGNOSIS — G8929 Other chronic pain: Secondary | ICD-10-CM | POA: Diagnosis not present

## 2021-12-20 DIAGNOSIS — Z79899 Other long term (current) drug therapy: Secondary | ICD-10-CM | POA: Diagnosis not present

## 2021-12-22 DIAGNOSIS — T3110 Burns involving 10-19% of body surface with 0% to 9% third degree burns: Secondary | ICD-10-CM | POA: Diagnosis not present

## 2021-12-22 DIAGNOSIS — Z681 Body mass index (BMI) 19 or less, adult: Secondary | ICD-10-CM | POA: Diagnosis not present

## 2021-12-23 DIAGNOSIS — E43 Unspecified severe protein-calorie malnutrition: Secondary | ICD-10-CM | POA: Diagnosis not present

## 2021-12-23 DIAGNOSIS — K9423 Gastrostomy malfunction: Secondary | ICD-10-CM | POA: Diagnosis not present

## 2021-12-23 DIAGNOSIS — Z431 Encounter for attention to gastrostomy: Secondary | ICD-10-CM | POA: Diagnosis not present

## 2022-01-02 DIAGNOSIS — R350 Frequency of micturition: Secondary | ICD-10-CM | POA: Diagnosis not present

## 2022-01-02 DIAGNOSIS — N3010 Interstitial cystitis (chronic) without hematuria: Secondary | ICD-10-CM | POA: Diagnosis not present

## 2022-01-02 DIAGNOSIS — R399 Unspecified symptoms and signs involving the genitourinary system: Secondary | ICD-10-CM | POA: Diagnosis not present

## 2022-01-11 DIAGNOSIS — R112 Nausea with vomiting, unspecified: Secondary | ICD-10-CM | POA: Diagnosis not present

## 2022-01-11 DIAGNOSIS — E43 Unspecified severe protein-calorie malnutrition: Secondary | ICD-10-CM | POA: Diagnosis not present

## 2022-01-12 DIAGNOSIS — Z1211 Encounter for screening for malignant neoplasm of colon: Secondary | ICD-10-CM | POA: Diagnosis not present

## 2022-01-12 DIAGNOSIS — G8929 Other chronic pain: Secondary | ICD-10-CM | POA: Diagnosis not present

## 2022-01-12 DIAGNOSIS — F319 Bipolar disorder, unspecified: Secondary | ICD-10-CM | POA: Diagnosis not present

## 2022-01-12 DIAGNOSIS — M5441 Lumbago with sciatica, right side: Secondary | ICD-10-CM | POA: Diagnosis not present

## 2022-01-12 DIAGNOSIS — E8941 Symptomatic postprocedural ovarian failure: Secondary | ICD-10-CM | POA: Diagnosis not present

## 2022-01-12 DIAGNOSIS — Z79899 Other long term (current) drug therapy: Secondary | ICD-10-CM | POA: Diagnosis not present

## 2022-01-16 DIAGNOSIS — Z79899 Other long term (current) drug therapy: Secondary | ICD-10-CM | POA: Diagnosis not present

## 2022-01-18 DIAGNOSIS — K3184 Gastroparesis: Secondary | ICD-10-CM | POA: Diagnosis not present

## 2022-01-18 DIAGNOSIS — Z681 Body mass index (BMI) 19 or less, adult: Secondary | ICD-10-CM | POA: Diagnosis not present

## 2022-01-18 DIAGNOSIS — K219 Gastro-esophageal reflux disease without esophagitis: Secondary | ICD-10-CM | POA: Diagnosis not present

## 2022-01-18 DIAGNOSIS — M25561 Pain in right knee: Secondary | ICD-10-CM | POA: Diagnosis not present

## 2022-01-24 DIAGNOSIS — Z86010 Personal history of colonic polyps: Secondary | ICD-10-CM | POA: Diagnosis not present

## 2022-01-26 DIAGNOSIS — R112 Nausea with vomiting, unspecified: Secondary | ICD-10-CM | POA: Diagnosis not present

## 2022-01-26 DIAGNOSIS — E43 Unspecified severe protein-calorie malnutrition: Secondary | ICD-10-CM | POA: Diagnosis not present

## 2022-01-26 DIAGNOSIS — K30 Functional dyspepsia: Secondary | ICD-10-CM | POA: Diagnosis not present

## 2022-02-10 DIAGNOSIS — F411 Generalized anxiety disorder: Secondary | ICD-10-CM | POA: Diagnosis not present

## 2022-02-10 DIAGNOSIS — F5101 Primary insomnia: Secondary | ICD-10-CM | POA: Diagnosis not present

## 2022-02-10 DIAGNOSIS — F319 Bipolar disorder, unspecified: Secondary | ICD-10-CM | POA: Diagnosis not present

## 2022-02-13 DIAGNOSIS — F319 Bipolar disorder, unspecified: Secondary | ICD-10-CM | POA: Diagnosis not present

## 2022-02-13 DIAGNOSIS — E8941 Symptomatic postprocedural ovarian failure: Secondary | ICD-10-CM | POA: Diagnosis not present

## 2022-02-13 DIAGNOSIS — G8929 Other chronic pain: Secondary | ICD-10-CM | POA: Diagnosis not present

## 2022-02-13 DIAGNOSIS — Z79899 Other long term (current) drug therapy: Secondary | ICD-10-CM | POA: Diagnosis not present

## 2022-02-13 DIAGNOSIS — M5441 Lumbago with sciatica, right side: Secondary | ICD-10-CM | POA: Diagnosis not present

## 2022-02-14 DIAGNOSIS — Z86010 Personal history of colonic polyps: Secondary | ICD-10-CM | POA: Diagnosis not present

## 2022-02-14 DIAGNOSIS — K635 Polyp of colon: Secondary | ICD-10-CM | POA: Diagnosis not present

## 2022-02-14 DIAGNOSIS — Z1211 Encounter for screening for malignant neoplasm of colon: Secondary | ICD-10-CM | POA: Diagnosis not present

## 2022-02-15 DIAGNOSIS — Z79899 Other long term (current) drug therapy: Secondary | ICD-10-CM | POA: Diagnosis not present

## 2022-02-17 DIAGNOSIS — N3010 Interstitial cystitis (chronic) without hematuria: Secondary | ICD-10-CM | POA: Diagnosis not present

## 2022-02-17 DIAGNOSIS — F17210 Nicotine dependence, cigarettes, uncomplicated: Secondary | ICD-10-CM | POA: Diagnosis not present

## 2022-02-17 DIAGNOSIS — F419 Anxiety disorder, unspecified: Secondary | ICD-10-CM | POA: Diagnosis not present

## 2022-02-17 DIAGNOSIS — R3915 Urgency of urination: Secondary | ICD-10-CM | POA: Diagnosis not present

## 2022-02-17 DIAGNOSIS — R350 Frequency of micturition: Secondary | ICD-10-CM | POA: Diagnosis not present

## 2022-02-17 DIAGNOSIS — F319 Bipolar disorder, unspecified: Secondary | ICD-10-CM | POA: Diagnosis not present

## 2022-02-17 DIAGNOSIS — M7918 Myalgia, other site: Secondary | ICD-10-CM | POA: Diagnosis not present

## 2022-02-20 DIAGNOSIS — E43 Unspecified severe protein-calorie malnutrition: Secondary | ICD-10-CM | POA: Diagnosis not present

## 2022-02-20 DIAGNOSIS — K3184 Gastroparesis: Secondary | ICD-10-CM | POA: Diagnosis not present

## 2022-02-20 DIAGNOSIS — Z882 Allergy status to sulfonamides status: Secondary | ICD-10-CM | POA: Diagnosis not present

## 2022-02-20 DIAGNOSIS — R112 Nausea with vomiting, unspecified: Secondary | ICD-10-CM | POA: Diagnosis not present

## 2022-02-20 DIAGNOSIS — Z886 Allergy status to analgesic agent status: Secondary | ICD-10-CM | POA: Diagnosis not present

## 2022-02-20 DIAGNOSIS — Z885 Allergy status to narcotic agent status: Secondary | ICD-10-CM | POA: Diagnosis not present

## 2022-02-20 DIAGNOSIS — Z888 Allergy status to other drugs, medicaments and biological substances status: Secondary | ICD-10-CM | POA: Diagnosis not present

## 2022-02-22 ENCOUNTER — Other Ambulatory Visit: Payer: Self-pay | Admitting: Medical

## 2022-02-25 DIAGNOSIS — K635 Polyp of colon: Secondary | ICD-10-CM | POA: Diagnosis not present

## 2022-02-27 DIAGNOSIS — E43 Unspecified severe protein-calorie malnutrition: Secondary | ICD-10-CM | POA: Diagnosis not present

## 2022-02-27 DIAGNOSIS — R112 Nausea with vomiting, unspecified: Secondary | ICD-10-CM | POA: Diagnosis not present

## 2022-02-27 DIAGNOSIS — Z01818 Encounter for other preprocedural examination: Secondary | ICD-10-CM | POA: Diagnosis not present

## 2022-02-27 DIAGNOSIS — Z886 Allergy status to analgesic agent status: Secondary | ICD-10-CM | POA: Diagnosis not present

## 2022-02-27 DIAGNOSIS — K3184 Gastroparesis: Secondary | ICD-10-CM | POA: Diagnosis not present

## 2022-02-27 DIAGNOSIS — Z434 Encounter for attention to other artificial openings of digestive tract: Secondary | ICD-10-CM | POA: Diagnosis not present

## 2022-02-27 DIAGNOSIS — Z888 Allergy status to other drugs, medicaments and biological substances status: Secondary | ICD-10-CM | POA: Diagnosis not present

## 2022-02-27 DIAGNOSIS — Z885 Allergy status to narcotic agent status: Secondary | ICD-10-CM | POA: Diagnosis not present

## 2022-03-13 DIAGNOSIS — E8941 Symptomatic postprocedural ovarian failure: Secondary | ICD-10-CM | POA: Diagnosis not present

## 2022-03-13 DIAGNOSIS — M5441 Lumbago with sciatica, right side: Secondary | ICD-10-CM | POA: Diagnosis not present

## 2022-03-13 DIAGNOSIS — Z79899 Other long term (current) drug therapy: Secondary | ICD-10-CM | POA: Diagnosis not present

## 2022-03-13 DIAGNOSIS — F319 Bipolar disorder, unspecified: Secondary | ICD-10-CM | POA: Diagnosis not present

## 2022-03-13 DIAGNOSIS — G8929 Other chronic pain: Secondary | ICD-10-CM | POA: Diagnosis not present

## 2022-03-15 DIAGNOSIS — Z79899 Other long term (current) drug therapy: Secondary | ICD-10-CM | POA: Diagnosis not present

## 2022-03-21 DIAGNOSIS — G4730 Sleep apnea, unspecified: Secondary | ICD-10-CM | POA: Diagnosis not present

## 2022-03-21 DIAGNOSIS — G4700 Insomnia, unspecified: Secondary | ICD-10-CM | POA: Diagnosis not present

## 2022-03-21 DIAGNOSIS — Z681 Body mass index (BMI) 19 or less, adult: Secondary | ICD-10-CM | POA: Diagnosis not present

## 2022-03-21 DIAGNOSIS — M545 Low back pain, unspecified: Secondary | ICD-10-CM | POA: Diagnosis not present

## 2022-03-21 DIAGNOSIS — J449 Chronic obstructive pulmonary disease, unspecified: Secondary | ICD-10-CM | POA: Diagnosis not present

## 2022-04-02 ENCOUNTER — Encounter (HOSPITAL_COMMUNITY): Payer: Self-pay | Admitting: Emergency Medicine

## 2022-04-02 ENCOUNTER — Other Ambulatory Visit: Payer: Self-pay

## 2022-04-02 ENCOUNTER — Emergency Department (HOSPITAL_COMMUNITY)
Admission: EM | Admit: 2022-04-02 | Discharge: 2022-04-02 | Disposition: A | Discharge status: Home / Self Care | Payer: BC Managed Care – PPO | Attending: Emergency Medicine | Admitting: Emergency Medicine

## 2022-04-02 DIAGNOSIS — G43909 Migraine, unspecified, not intractable, without status migrainosus: Secondary | ICD-10-CM

## 2022-04-02 DIAGNOSIS — Z79899 Other long term (current) drug therapy: Secondary | ICD-10-CM | POA: Insufficient documentation

## 2022-04-02 DIAGNOSIS — I10 Essential (primary) hypertension: Secondary | ICD-10-CM | POA: Diagnosis not present

## 2022-04-02 LAB — BASIC METABOLIC PANEL
Anion gap: 5 (ref 5–15)
BUN: 10 mg/dL (ref 6–20)
CO2: 23 mmol/L (ref 22–32)
Calcium: 8.8 mg/dL — ABNORMAL LOW (ref 8.9–10.3)
Chloride: 110 mmol/L (ref 98–111)
Creatinine, Ser: 0.55 mg/dL (ref 0.44–1.00)
GFR, Estimated: >60 mL/min (ref >60)
Glucose, Bld: 109 mg/dL — ABNORMAL HIGH (ref 70–99)
Potassium: 3.7 mmol/L (ref 3.5–5.1)
Sodium: 138 mmol/L (ref 135–145)

## 2022-04-02 LAB — CBC WITH DIFFERENTIAL/PLATELET
Abs Immature Granulocytes: 0.03 10*3/uL (ref 0.00–0.07)
Basophils Absolute: 0.1 10*3/uL (ref 0.0–0.1)
Basophils Relative: 1 %
Eosinophils Absolute: 0.1 10*3/uL (ref 0.0–0.5)
Eosinophils Relative: 1 %
HCT: 35.9 % — ABNORMAL LOW (ref 36.0–46.0)
Hemoglobin: 11.8 g/dL — ABNORMAL LOW (ref 12.0–15.0)
Immature Granulocytes: 0 %
Lymphocytes Relative: 39 %
Lymphs Abs: 3.3 10*3/uL (ref 0.7–4.0)
MCH: 30.7 pg (ref 26.0–34.0)
MCHC: 32.9 g/dL (ref 30.0–36.0)
MCV: 93.5 fL (ref 80.0–100.0)
Monocytes Absolute: 0.4 10*3/uL (ref 0.1–1.0)
Monocytes Relative: 5 %
Neutro Abs: 4.6 10*3/uL (ref 1.7–7.7)
Neutrophils Relative %: 54 %
Platelets: 330 10*3/uL (ref 150–400)
RBC: 3.84 MIL/uL — ABNORMAL LOW (ref 3.87–5.11)
RDW: 13.2 % (ref 11.5–15.5)
WBC: 8.4 10*3/uL (ref 4.0–10.5)
nRBC: 0 % (ref 0.0–0.2)

## 2022-04-02 LAB — URINALYSIS, ROUTINE W REFLEX MICROSCOPIC
Bilirubin Urine: NEGATIVE
Glucose, UA: NEGATIVE mg/dL
Hgb urine dipstick: NEGATIVE
Ketones, ur: NEGATIVE mg/dL
Leukocytes,Ua: NEGATIVE
Nitrite: NEGATIVE
Protein, ur: NEGATIVE mg/dL
Specific Gravity, Urine: 1.004 — ABNORMAL LOW (ref 1.005–1.030)
pH: 6 (ref 5.0–8.0)

## 2022-04-02 LAB — MAGNESIUM: Magnesium: 1.8 mg/dL (ref 1.7–2.4)

## 2022-04-02 MED ORDER — SODIUM CHLORIDE 0.9 % IV BOLUS
1000.0000 mL | Freq: Once | INTRAVENOUS | Status: AC
  Administered 2022-04-02: 1000 mL via INTRAVENOUS

## 2022-04-02 MED ORDER — PROCHLORPERAZINE EDISYLATE 10 MG/2ML IJ SOLN
10.0000 mg | Freq: Once | INTRAMUSCULAR | Status: AC
  Administered 2022-04-02: 10 mg via INTRAVENOUS
  Filled 2022-04-02: qty 2

## 2022-04-02 MED ORDER — DEXAMETHASONE SODIUM PHOSPHATE 10 MG/ML IJ SOLN
10.0000 mg | Freq: Once | INTRAMUSCULAR | Status: AC
  Administered 2022-04-02: 10 mg via INTRAVENOUS
  Filled 2022-04-02: qty 1

## 2022-04-02 MED ORDER — DIPHENHYDRAMINE HCL 50 MG/ML IJ SOLN
25.0000 mg | Freq: Once | INTRAMUSCULAR | Status: AC
  Administered 2022-04-02: 25 mg via INTRAVENOUS
  Filled 2022-04-02: qty 1

## 2022-04-02 MED ORDER — ONDANSETRON HCL 4 MG/2ML IJ SOLN
4.0000 mg | Freq: Once | INTRAMUSCULAR | Status: AC
  Administered 2022-04-02: 4 mg via INTRAVENOUS
  Filled 2022-04-02: qty 2

## 2022-04-02 NOTE — ED Triage Notes (Signed)
Pt presents with migraine headache with light sensitive x 1 day, usually has to come to ED for Migraine cocktail.

## 2022-04-02 NOTE — ED Provider Notes (Signed)
[facility] EMERGENCY DEPARTMENT Provider Note   CSN: [id number] Arrival date & time: 04/02/22  1328     History  Chief Complaint  Patient presents with   Migraine    [name] is a 46 y.o. female presents to the ED complaining of a migraine headache with light sensitivity and nausea.  She states that she had a headache began a couple of days ago, and progressed into a migraine earlier today.  She states that she usually has to come to the ED for a migraine cocktail due to not being able to take medications by mouth, patient has a G-tube in place.  Patient over the last 48 hours has not been using G-tube feeds for nutritional supplementation, but has been eating some meals by mouth.  Patient also reports that she has not had very good hydration over the last few days due to being under stress.  Denies fever, chills, weakness, syncope, numbness, neurodeficit.       Home Medications Prior to Admission medications   Medication Sig Start Date End Date Taking? Authorizing Provider  acetaminophen (TYLENOL) 500 MG tablet Take 1,000 mg by mouth every 6 (six) hours as needed for mild pain, fever or headache.    [provider]  albuterol (PROAIR HFA) 108 (90 Base) MCG/ACT inhaler INHALE 2 PUFFS EVERY 6 HOURS AS NEEDED FOR SHORTNESS OF BREATH AND WHEEZING. Patient taking differently: Inhale 2 puffs into the lungs every 6 (six) hours as needed for shortness of breath or wheezing. 02/27/20   [name], DO  clonazePAM (KLONOPIN) 0.5 MG tablet Take 1 tablet (0.5 mg total) by mouth 3 (three) times daily as needed for anxiety. 05/01/19   [name], DO  cyclobenzaprine (FLEXERIL) 5 MG tablet Take 1 tablet (5 mg total) by mouth 3 (three) times daily as needed for muscle spasms. 05/21/20   Mordecai Rasmussen, MD  estradiol (ESTRACE) 0.1 MG/GM vaginal cream Place 1 Applicatorful vaginally every 3 (three) days. 11/21/19   [provider]  estradiol (ESTRACE) 2 MG tablet  Take 2 mg by mouth daily.    [provider]  fluticasone (FLONASE) 50 MCG/ACT nasal spray Place 2 sprays into both nostrils daily. Patient taking differently: Place 2 sprays into both nostrils daily as needed for allergies. 04/22/19   [name], DO  gabapentin (NEURONTIN) 800 MG tablet Take 0.5 tablets (400 mg total) by mouth in the morning, at noon, in the evening, and at bedtime. 02/01/21   Barb Merino, MD  HYDROcodone-acetaminophen (NORCO/VICODIN) 5-325 MG tablet Take 1 tablet by mouth 3 (three) times daily as needed. 02/01/21   Barb Merino, MD  hydrOXYzine (ATARAX/VISTARIL) 50 MG tablet Take 50 mg by mouth at bedtime. 08/26/18   [provider]  Hyoscyamine Sulfate SL 0.125 MG SUBL Take 0.125 mg by mouth every 4 (four) hours as needed (for cramping). 11/04/19   [provider]  lipase/protease/amylase (CREON) 36000 UNITS CPEP capsule Take 1 capsule (36,000 Units total) by mouth 4 (four) times daily -  with meals and at bedtime. 04/26/21   Carlan, Chelsea L, NP  mirtazapine (REMERON) 15 MG tablet Take 15 mg by mouth at bedtime. 11/24/20   [provider]  ondansetron (ZOFRAN) 4 MG tablet Take 1 tablet (4 mg total) by mouth every 8 (eight) hours as needed for nausea or vomiting. 02/09/21   Marcello Fennel, PA-C  ondansetron (ZOFRAN-ODT) 8 MG disintegrating tablet Take 1 tablet (8 mg total) by  mouth every 8 (eight) hours as needed for nausea or vomiting. 48/54/62   Delora Fuel, MD  oxybutynin (DITROPAN XL) 15 MG 24 hr tablet Take 15 mg by mouth daily. 09/16/19   [provider]  pantoprazole (PROTONIX) 40 MG tablet Take 1 tablet (40 mg total) by mouth daily. 04/26/21   Carlan, Chelsea L, NP  promethazine (PHENERGAN) 25 MG tablet TAKE (1) TABLET EVERY SIX HOURS AS NEEDED FOR NAUSEA AND VOMITING. 02/14/21   Carollee Herter, Alferd Apa, DO  QUEtiapine (SEROQUEL) 400 MG tablet Take 400 mg by mouth at bedtime.  09/16/18   [provider]   RESTASIS 0.05 % ophthalmic emulsion Place 1 drop into both eyes 2 (two) times daily. 02/26/20   [provider]  SYMBICORT 80-4.5 MCG/ACT inhaler INHALE 2 PUFFS INTO THE LUNGS TWICE DAILY. 02/22/22   Saguier, Percell Miller, PA-C      Allergies    Abilify [aripiprazole], Amitriptyline, Metoclopramide hcl, Propoxyphene, Toradol [ketorolac tromethamine], Tramadol, Ambien [zolpidem tartrate], Eszopiclone, Fentanyl, Varenicline, Buprenorphine hcl, Demerol [meperidine], Emetrol, and Morphine and related    Review of Systems   Review of Systems  Physical Exam Updated Vital Signs BP 102/66   Pulse 81   Temp 98.3 F (36.8 C) (Oral)   Resp 16   Ht '5\' 4"'$  (1.626 m)   Wt 44.5 kg   SpO2 96%   BMI 16.82 kg/m  Physical Exam Vitals and nursing note reviewed.  Constitutional:      General: She is not in acute distress.    Appearance: She is not ill-appearing.  HENT:     Head: Normocephalic and atraumatic.     Mouth/Throat:     Mouth: Mucous membranes are moist.     Pharynx: Oropharynx is clear.  Eyes:     Extraocular Movements: Extraocular movements intact.     Conjunctiva/sclera: Conjunctivae normal.     Pupils: Pupils are equal, round, and reactive to light.  Cardiovascular:     Rate and Rhythm: Normal rate and regular rhythm.     Pulses: Normal pulses.     Heart sounds: Normal heart sounds.  Pulmonary:     Effort: Pulmonary effort is normal. No tachypnea or respiratory distress.     Breath sounds: Normal breath sounds and air entry.  Abdominal:     General: Abdomen is flat. Bowel sounds are normal. There is no distension.     Palpations: Abdomen is soft.     Tenderness: There is no abdominal tenderness.     Comments: G-tube in place without erythema around site  Skin:    General: Skin is warm and dry.     Capillary Refill: Capillary refill takes less than 2 seconds.     Coloration: Skin is not pale.  Neurological:     Mental Status: She is alert and oriented to person,  place, and time. Mental status is at baseline.     Cranial Nerves: Cranial nerves 2-12 are intact.     Sensory: Sensation is intact.     Motor: Motor function is intact.     Coordination: Coordination is intact.     Gait: Gait is intact.     Comments: Patient was ambulatory without abnormal gait.  Motor and sensation are both grossly intact.  CN II through XII grossly intact without obvious deficit.  Psychiatric:        Mood and Affect: Mood normal.        Speech: Speech normal. Speech is not slurred.  Behavior: Behavior normal.     ED Results / Procedures / Treatments   Labs (all labs ordered are listed, but only abnormal results are displayed) Labs Reviewed  BASIC METABOLIC PANEL - Abnormal; Notable for the following components:      Result Value   Glucose, Bld 109 (*)    Calcium 8.8 (*)    All other components within normal limits  CBC WITH DIFFERENTIAL/PLATELET - Abnormal; Notable for the following components:   RBC 3.84 (*)    Hemoglobin 11.8 (*)    HCT 35.9 (*)    All other components within normal limits  MAGNESIUM  URINALYSIS, ROUTINE W REFLEX MICROSCOPIC    EKG None  Radiology No results found.  Procedures Procedures    Medications Ordered in ED Medications  sodium chloride 0.9 % bolus 1,000 mL (1,000 mLs Intravenous New Bag/Given 04/02/22 1449)  prochlorperazine (COMPAZINE) injection 10 mg (10 mg Intravenous Given 04/02/22 1531)  dexamethasone (DECADRON) injection 10 mg (10 mg Intravenous Given 04/02/22 1531)  diphenhydrAMINE (BENADRYL) injection 25 mg (25 mg Intravenous Given 04/02/22 1531)  ondansetron (ZOFRAN) injection 4 mg (4 mg Intravenous Given 04/02/22 1531)    ED Course/ Medical Decision Making/ A&P                           Medical Decision Making Amount and/or Complexity of Data Reviewed Labs: ordered.   This patient presents to the ED with chief complaint(s) of migraine headache with pertinent past medical history of migraines,  HTN, severe protein calorie malnutrition requiring g-tube feeds for supplementation.  The complaint involves an extensive differential diagnosis and also carries with it a high risk of complications and morbidity.    The differential diagnosis includes migraine headache, tension headache, cluster headache, electrolyte disturbance, malnutrition, bacterial infection, viral etiology  The initial plan is to obtain baseline labs and give migraine cocktail  Additional history obtained: Additional history obtained from spouse Records reviewed  previous lab results from 01/11/22 and recent gastro visits   Initial Assessment:   On exam, patient appears to be uncomfortable, is wearing sunglasses, and has room lights darkened.  She is not ill-appearing.  Lungs clear to auscultation bilaterally.  Heart rate is normal with regular rhythm.  Neuro exam is unremarkable, no neuro deficits.  She moves all extremities without difficulty, no identifiable weaknesses or unilateral deficits.  Patient was observed ambulating without difficulty.  EOM intact.  PERRL.  Skin is warm and dry.  G-tube is in place without surrounding erythema.   Independent ECG/labs interpretation:  The following labs were independently interpreted:  CBC demonstrates no leukocytosis, she does have mild anemia with a hemoglobin of 03.2 Metabolic panel significant for mild hypocalcemia, no major electrolyte disturbances.  Independent visualization and interpretation of imaging: I independently visualized the following imaging with scope of interpretation limited to determining acute life threatening conditions related to emergency care: Not indicated  Treatment and Reassessment: Will treat patient with fluid bolus and modified migraine cocktail with Zofran, Benadryl, Decadron, and Compazine.  Patient cannot take Toradol.    Upon reassessment, patient states she feels better.  She states some of the headache is still there but she feels much  improved than when she came in and would like to go home to rest.    Disposition:   The patient has been appropriately medically screened and/or stabilized in the ED. I have low suspicion for any other emergent medical condition which would require further screening,  evaluation or treatment in the ED or require inpatient management. At time of discharge the patient is hemodynamically stable and in no acute distress. I have discussed work-up results and diagnosis with patient and answered all questions. Patient is agreeable with discharge plan. We discussed strict return precautions for returning to the emergency department and they verbalized understanding.  Recommended patient follow-up with her primary care provider if she continues to have migraine headaches.  Encouraged good hydration and nutrition.           Final Clinical Impression(s) / ED Diagnoses Final diagnoses:  Migraine without status migrainosus, not intractable, unspecified migraine type    Rx / DC Orders ED Discharge Orders     None         Pat Kocher, [place] 04/02/22 1704    Sherwood Gambler, MD 04/03/22 [phone]

## 2022-04-02 NOTE — Discharge Instructions (Addendum)
Thank you for allowing me to be part of your care today.  You were treated for a migraine headache.  I recommended staying well hydrated, maintain good nutrition, and get plenty of rest.  I also encourage finding methods to reduce your stress.   Please follow up with your primary care provider if you continue to experience migraine headaches.   Return to the ED if you develop worsening symptoms or have any new concerns.

## 2022-06-08 ENCOUNTER — Encounter: Payer: Self-pay | Admitting: Radiology

## 2022-06-13 ENCOUNTER — Emergency Department (HOSPITAL_BASED_OUTPATIENT_CLINIC_OR_DEPARTMENT_OTHER)
Admission: EM | Admit: 2022-06-13 | Discharge: 2022-06-13 | Disposition: A | Discharge status: Home / Self Care | Payer: BC Managed Care – PPO | Attending: Emergency Medicine | Admitting: Emergency Medicine

## 2022-06-13 ENCOUNTER — Other Ambulatory Visit: Payer: Self-pay

## 2022-06-13 ENCOUNTER — Encounter (HOSPITAL_BASED_OUTPATIENT_CLINIC_OR_DEPARTMENT_OTHER): Payer: Self-pay | Admitting: *Deleted

## 2022-06-13 DIAGNOSIS — K9423 Gastrostomy malfunction: Secondary | ICD-10-CM | POA: Diagnosis present

## 2022-06-13 DIAGNOSIS — R339 Retention of urine, unspecified: Secondary | ICD-10-CM

## 2022-06-13 DIAGNOSIS — J449 Chronic obstructive pulmonary disease, unspecified: Secondary | ICD-10-CM | POA: Insufficient documentation

## 2022-06-13 DIAGNOSIS — E860 Dehydration: Secondary | ICD-10-CM | POA: Diagnosis not present

## 2022-06-13 DIAGNOSIS — E876 Hypokalemia: Secondary | ICD-10-CM | POA: Diagnosis not present

## 2022-06-13 DIAGNOSIS — G8929 Other chronic pain: Secondary | ICD-10-CM | POA: Diagnosis not present

## 2022-06-13 DIAGNOSIS — Z7951 Long term (current) use of inhaled steroids: Secondary | ICD-10-CM | POA: Diagnosis not present

## 2022-06-13 DIAGNOSIS — T85598A Other mechanical complication of other gastrointestinal prosthetic devices, implants and grafts, initial encounter: Secondary | ICD-10-CM

## 2022-06-13 DIAGNOSIS — F419 Anxiety disorder, unspecified: Secondary | ICD-10-CM | POA: Insufficient documentation

## 2022-06-13 LAB — COMPREHENSIVE METABOLIC PANEL
ALT: 13 U/L (ref 0–44)
AST: 23 U/L (ref 15–41)
Albumin: 3.6 g/dL (ref 3.5–5.0)
Alkaline Phosphatase: 80 U/L (ref 38–126)
Anion gap: 7 (ref 5–15)
BUN: 12 mg/dL (ref 6–20)
CO2: 25 mmol/L (ref 22–32)
Calcium: 8.9 mg/dL (ref 8.9–10.3)
Chloride: 104 mmol/L (ref 98–111)
Creatinine, Ser: 0.76 mg/dL (ref 0.44–1.00)
GFR, Estimated: >60 mL/min (ref >60)
Glucose, Bld: 96 mg/dL (ref 70–99)
Potassium: 3.3 mmol/L — ABNORMAL LOW (ref 3.5–5.1)
Sodium: 136 mmol/L (ref 135–145)
Total Bilirubin: 0.4 mg/dL (ref 0.3–1.2)
Total Protein: 6.9 g/dL (ref 6.5–8.1)

## 2022-06-13 LAB — CBC WITH DIFFERENTIAL/PLATELET
Abs Immature Granulocytes: 0.04 10*3/uL (ref 0.00–0.07)
Basophils Absolute: 0.1 10*3/uL (ref 0.0–0.1)
Basophils Relative: 1 %
Eosinophils Absolute: 0.2 10*3/uL (ref 0.0–0.5)
Eosinophils Relative: 2 %
HCT: 35.2 % — ABNORMAL LOW (ref 36.0–46.0)
Hemoglobin: 11.7 g/dL — ABNORMAL LOW (ref 12.0–15.0)
Immature Granulocytes: 0 %
Lymphocytes Relative: 34 %
Lymphs Abs: 3.6 10*3/uL (ref 0.7–4.0)
MCH: 30.7 pg (ref 26.0–34.0)
MCHC: 33.2 g/dL (ref 30.0–36.0)
MCV: 92.4 fL (ref 80.0–100.0)
Monocytes Absolute: 0.5 10*3/uL (ref 0.1–1.0)
Monocytes Relative: 5 %
Neutro Abs: 6 10*3/uL (ref 1.7–7.7)
Neutrophils Relative %: 58 %
Platelets: 364 10*3/uL (ref 150–400)
RBC: 3.81 MIL/uL — ABNORMAL LOW (ref 3.87–5.11)
RDW: 13 % (ref 11.5–15.5)
WBC: 10.5 10*3/uL (ref 4.0–10.5)
nRBC: 0 % (ref 0.0–0.2)

## 2022-06-13 LAB — URINALYSIS, ROUTINE W REFLEX MICROSCOPIC
Bilirubin Urine: NEGATIVE
Glucose, UA: NEGATIVE mg/dL
Hgb urine dipstick: NEGATIVE
Ketones, ur: NEGATIVE mg/dL
Nitrite: POSITIVE — AB
Protein, ur: NEGATIVE mg/dL
Specific Gravity, Urine: 1.025 (ref 1.005–1.030)
pH: 5.5 (ref 5.0–8.0)

## 2022-06-13 LAB — URINALYSIS, MICROSCOPIC (REFLEX)

## 2022-06-13 LAB — LIPASE, BLOOD: Lipase: 27 U/L (ref 11–51)

## 2022-06-13 MED ORDER — LACTATED RINGERS IV BOLUS
1000.0000 mL | Freq: Once | INTRAVENOUS | Status: AC
  Administered 2022-06-13: 1000 mL via INTRAVENOUS

## 2022-06-13 MED ORDER — POTASSIUM CHLORIDE CRYS ER 20 MEQ PO TBCR
40.0000 meq | EXTENDED_RELEASE_TABLET | Freq: Once | ORAL | Status: DC
  Filled 2022-06-13: qty 2

## 2022-06-13 MED ORDER — HYDROMORPHONE HCL 1 MG/ML IJ SOLN
0.5000 mg | Freq: Once | INTRAMUSCULAR | Status: AC
  Administered 2022-06-13: 0.5 mg via INTRAVENOUS
  Filled 2022-06-13: qty 1

## 2022-06-13 NOTE — ED Triage Notes (Addendum)
Here by POV with other for "feeding tube problem". Mickey button present. Describes as lifting heavy box 2d ago and felt a pop in abd, believes balloon burst. Verbalizes h/o same x26. Was instructed not to feed. Also mentions HA, and chronic IC back and leg pain. Has been unable to charge her stimulator. Alert, NAD, calm, interactive, sitting in w/c.

## 2022-06-13 NOTE — ED Notes (Signed)
ED Provider at bedside. 

## 2022-06-13 NOTE — Discharge Instructions (Addendum)
Thank you for letting us take care of you today.  Overall, your workup was reassuring. On my and my attending physician's assessment, your feeding tube appears to be in place with an intact balloon.  You may resume your tube feeds at home.  This is very important to maintain good nutrition.  I recommend that you follow-up with your GI doctor in the next few days to discuss your recurrent issues with displacement of your feeding tube and problems with nutrition.  Please also follow-up with your PCP for discussion of better long-term management of your chronic pain and other health conditions.  It is important to routinely catheterize the urine out of your bladder if you are not able to void urine on your own.  You should be doing this multiple times a day in order to prevent urinary retention which can cause worsening of her chronic abdominal pain and lead to other deteriorating health conditions.  If you develop any new concerns or worsening condition, please return to the nearest emergency department for reevaluation.

## 2022-06-13 NOTE — ED Notes (Signed)
Spoke with Bland Span in lab to add on urine culture

## 2022-06-13 NOTE — ED Provider Notes (Signed)
[facility] Provider Note   CSN: [id number] Arrival date & time: 06/13/22  1533     History  Chief Complaint  Patient presents with   feeding tube problems    [name] is a 47 y.o. female with past medical history IBS, interstitial cystitis, migraine headache, fibromyalgia, chronic pain, G-tube placement due to difficulty feeding secondary to history of chronic pancreatitis, COPD, bipolar disorder who presents to the emergency department complaining of concern for feeding tube displacement.  Patient states that 3 days ago she was lifting a box when she felt a "pop" in her abdomen and believes that the plantar feeding tube burst.  Since that time, patient has not been completing feeding tube feeds at home as instructed by her outpatient provider.  She states that she has had many previous episodes where her feeding tube burst and it was last replaced approximately 1 month ago.  Of note, patient also has a bladder pacemaker and states that this has been dysfunctioning so she has been self catheterizing at home and only able to eliminate small amounts of urine.  She is concerned that she is retaining urine.  She states that when her bladder pacemaker dysfunctions she has flareups of her chronic pain secondary to her interstitial cystitis and is currently having pain in her lower back is typical for her chronic pain when this happens.  She also complains of a generalized headache that is typical of her chronic headaches.  Patient reports that she is taking Tylenol and ibuprofen at home for pain with no relief.  She states that the only pain medication she is able to take his Dilaudid this is the only thing that we will control her pain when it gets this severe.  She denies focal weakness, fever, chills, nausea, vomiting, chest pain, shortness of breath, vision changes, lightheadedness, syncope, loss of consciousness.      Home Medications Prior to  Admission medications   Medication Sig Start Date End Date Taking? Authorizing Provider  acetaminophen (TYLENOL) 500 MG tablet Take 1,000 mg by mouth every 6 (six) hours as needed for mild pain, fever or headache.    [provider]  albuterol (PROAIR HFA) 108 (90 Base) MCG/ACT inhaler INHALE 2 PUFFS EVERY 6 HOURS AS NEEDED FOR SHORTNESS OF BREATH AND WHEEZING. Patient taking differently: Inhale 2 puffs into the lungs every 6 (six) hours as needed for shortness of breath or wheezing. 02/27/20   [name], DO  clonazePAM (KLONOPIN) 0.5 MG tablet Take 1 tablet (0.5 mg total) by mouth 3 (three) times daily as needed for anxiety. 05/01/19   [name], DO  cyclobenzaprine (FLEXERIL) 5 MG tablet Take 1 tablet (5 mg total) by mouth 3 (three) times daily as needed for muscle spasms. 05/21/20   Mordecai Rasmussen, MD  estradiol (ESTRACE) 0.1 MG/GM vaginal cream Place 1 Applicatorful vaginally every 3 (three) days. 11/21/19   [provider]  estradiol (ESTRACE) 2 MG tablet Take 2 mg by mouth daily.    [provider]  fluticasone (FLONASE) 50 MCG/ACT nasal spray Place 2 sprays into both nostrils daily. Patient taking differently: Place 2 sprays into both nostrils daily as needed for allergies. 04/22/19   [name], DO  gabapentin (NEURONTIN) 800 MG tablet Take 0.5 tablets (400 mg total) by mouth in the morning, at noon, in the evening, and at bedtime. 02/01/21   Barb Merino, MD  HYDROcodone-acetaminophen (NORCO/VICODIN) [phone]  MG tablet Take 1 tablet by mouth 3 (three) times daily as needed. 02/01/21   Barb Merino, MD  hydrOXYzine (ATARAX/VISTARIL) 50 MG tablet Take 50 mg by mouth at bedtime. 08/26/18   [provider]  Hyoscyamine Sulfate SL 0.125 MG SUBL Take 0.125 mg by mouth every 4 (four) hours as needed (for cramping). 11/04/19   [provider]  lipase/protease/amylase (CREON) 36000 UNITS CPEP capsule Take 1 capsule (36,000  Units total) by mouth 4 (four) times daily -  with meals and at bedtime. 04/26/21   Carlan, Chelsea L, NP  mirtazapine (REMERON) 15 MG tablet Take 15 mg by mouth at bedtime. 11/24/20   [provider]  ondansetron (ZOFRAN) 4 MG tablet Take 1 tablet (4 mg total) by mouth every 8 (eight) hours as needed for nausea or vomiting. 02/09/21   Marcello Fennel, PA-C  ondansetron (ZOFRAN-ODT) 8 MG disintegrating tablet Take 1 tablet (8 mg total) by mouth every 8 (eight) hours as needed for nausea or vomiting. [id number]   Delora Fuel, MD  oxybutynin (DITROPAN XL) 15 MG 24 hr tablet Take 15 mg by mouth daily. 09/16/19   [provider]  pantoprazole (PROTONIX) 40 MG tablet Take 1 tablet (40 mg total) by mouth daily. 04/26/21   Carlan, Chelsea L, NP  promethazine (PHENERGAN) 25 MG tablet TAKE (1) TABLET EVERY SIX HOURS AS NEEDED FOR NAUSEA AND VOMITING. 02/14/21   Carollee Herter, Alferd Apa, DO  QUEtiapine (SEROQUEL) 400 MG tablet Take 400 mg by mouth at bedtime.  09/16/18   [provider]  RESTASIS 0.05 % ophthalmic emulsion Place 1 drop into both eyes 2 (two) times daily. 02/26/20   [provider]  SYMBICORT 80-4.5 MCG/ACT inhaler INHALE 2 PUFFS INTO THE LUNGS TWICE DAILY. 02/22/22   Saguier, Percell Miller, PA-C      Allergies    Abilify [aripiprazole], Amitriptyline, Metoclopramide hcl, Propoxyphene, Toradol [ketorolac tromethamine], Tramadol, Ambien [zolpidem tartrate], Eszopiclone, Fentanyl, Varenicline, Buprenorphine hcl, Demerol [meperidine], Emetrol, and Morphine and related    Review of Systems   Review of Systems  All other systems reviewed and are negative.   Physical Exam Updated Vital Signs BP 110/80   Pulse 84   Temp 98.1 F (36.7 C)   Resp 20   SpO2 94%  Physical Exam Vitals and nursing note reviewed.  Constitutional:      General: She is not in acute distress.    Appearance: Normal appearance. She is not ill-appearing, toxic-appearing or diaphoretic.  HENT:      Head: Normocephalic and atraumatic.     Mouth/Throat:     Mouth: Mucous membranes are dry.  Eyes:     General: No scleral icterus.    Extraocular Movements: Extraocular movements intact.     Conjunctiva/sclera: Conjunctivae normal.  Cardiovascular:     Rate and Rhythm: Normal rate and regular rhythm.     Heart sounds: No murmur heard. Pulmonary:     Effort: Pulmonary effort is normal. No respiratory distress.     Breath sounds: Normal breath sounds. No wheezing, rhonchi or rales.  Abdominal:     General: Abdomen is flat. There is no distension.     Palpations: Abdomen is soft.     Tenderness: There is no abdominal tenderness. There is no right CVA tenderness, left CVA tenderness, guarding or rebound.     Comments: G-tube to upper abdomen without surrounding erythema, drainage, or other skin changes or signs of infection, G-tube appears to be correctly positioned, patient verbalizes pain with  palpation the area surrounding the G-tube but this appears distractible with conversation regarding her presentation today patient has no guarding, rebound, and no appearance of a pain response with light and deep palpation  Musculoskeletal:        General: Normal range of motion.     Cervical back: Normal range of motion and neck supple. No rigidity.     Right lower leg: No edema.     Left lower leg: No edema.  Skin:    General: Skin is warm and dry.     Capillary Refill: Capillary refill takes less than 2 seconds.     Coloration: Skin is not jaundiced or pale.  Neurological:     Mental Status: She is alert and oriented to person, place, and time.     GCS: GCS eye subscore is 4. GCS verbal subscore is 5. GCS motor subscore is 6.     Cranial Nerves: Cranial nerves 2-12 are intact.     Motor: Motor function is intact.  Psychiatric:        Mood and Affect: Mood is anxious.        Behavior: Behavior is cooperative.     ED Results / Procedures / Treatments   Labs (all labs ordered are  listed, but only abnormal results are displayed) Labs Reviewed  CBC WITH DIFFERENTIAL/PLATELET - Abnormal; Notable for the following components:      Result Value   RBC 3.81 (*)    Hemoglobin 11.7 (*)    HCT 35.2 (*)    All other components within normal limits  COMPREHENSIVE METABOLIC PANEL - Abnormal; Notable for the following components:   Potassium 3.3 (*)    All other components within normal limits  URINALYSIS, ROUTINE W REFLEX MICROSCOPIC - Abnormal; Notable for the following components:   APPearance CLOUDY (*)    Nitrite POSITIVE (*)    Leukocytes,Ua SMALL (*)    All other components within normal limits  URINALYSIS, MICROSCOPIC (REFLEX) - Abnormal; Notable for the following components:   Bacteria, UA MANY (*)    All other components within normal limits  URINE CULTURE  LIPASE, BLOOD    EKG None  Radiology No results found.  Procedures Procedures  On bladder scan, patient had approximately 400 mL of urine in the bladder.  She states that she has only catheterized once today and that was early this morning.  She does not appear to catheterize regularly and only voids small amounts of urine on her own accord.    Medications Ordered in ED Medications  HYDROmorphone (DILAUDID) injection 0.5 mg (0.5 mg Intravenous Given 06/13/22 1647)  lactated ringers bolus 1,000 mL (0 mLs Intravenous Stopped 06/13/22 1817)  HYDROmorphone (DILAUDID) injection 0.5 mg (0.5 mg Intravenous Given 06/13/22 1719)    ED Course/ Medical Decision Making/ A&P                             Medical Decision Making Amount and/or Complexity of Data Reviewed Labs: ordered. Decision-making details documented in ED Course.  Risk Prescription drug management.   Medical Decision Making:   DOSIA LEINWEBER is a 47 y.o. female who presented to the ED today with feeding tube dysfunction detailed above.    Patient's presentation is complicated by their history of chronic pancreatitis, chronic pain, chronic  malnutrition.  Complete initial physical exam performed, notably the patient was in no acute distress, due to close antibiotics to upper abdominal with no surrounding  skin changes or laxity to suggest dislocation.  Abdomen soft, nontender, without rebound, guarding, or peritoneal signs.  Patient was afebrile with stable vital signs.  She is neurologically intact.  She did appear slightly dehydrated clinically. Reviewed and confirmed nursing documentation for past medical history, family history, social history.    Initial Assessment:   With the patient's presentation of feeding tube dysfunction, most likely diagnosis is dislocated g-tube. Differential diagnosis includes but is not limited to dehydration, malnutrition, electrolyte disturbance, AKI, indwelling tube infection, feeding tube balloon rupture, UTI, acute abdomen. This is most consistent with an acute complicated illness  Initial Plan:  Screening labs including CBC and Metabolic panel to evaluate for infectious or metabolic etiology of disease.  Lipase to evaluate for acute pancreatitis Urinalysis with reflex culture ordered to evaluate for UTI or relevant urologic/nephrologic pathology.  Objective evaluation as reviewed   Initial Study Results:   Laboratory  All laboratory results reviewed without evidence of clinically relevant pathology.   Exceptions include: K 3.3, Hgb 11.7, UA nitrite positive with small leukocytes     Reassessments:  Attending physician Dr. Rogene Houston evaluated patient and is in agreement that G-tube appears to be tightly in place with no signs of dislodgment or balloon rupture.  Consulted with radiology techs and on review of patient's G-tube we do not have the extension in order to do G-tube evaluation by x-ray study.  Pt self catheterized and was able to empty over 72m urine.   Updated pt and family friend at bedside on findings and discharge plan.   Final Assessment and Plan:   This is a 47year old  chronically ill female who presents to ED with chief concern of g-tube dislodgement.  Patient reports recurrent history of G-tube dislodgment.  She states that 3 days ago she lifted a box and believes that she felt the balloon within the G-tube in place pop.  Since that time, she has not been doing tube feeds at the recommendation of her outpatient GI provider.  On arrival today, patient with stable vital signs.  On exam, patient's G-tube appears tightly in place without any surrounding skin changes or tenderness.  Unfortunately, we do not have extension here in order to insert saline or air into tube to further evaluate placement is no laxity and again G-tube is tightly in place on both my exam and my attending physician's exam.  Discussion with x-ray, they also do not have the extension to do radiographic study of the G-tube.  Patient appears slightly dehydrated on exam so IV fluids administered while awaiting further lab work to evaluate for underlying malnutrition, electrolyte disturbance, or other acute emergent condition.  Patient also with complaint of simply chronic pain and this given 2 small doses of Dilaudid while waiting in the ED.  On PDMP review, patient recently received 30-day prescription for home narcotics so will not be sending home with pain medication today.  Patient reports dysfunction of her bladder pacemaker as well.  She has been self catheterizing at home but has only been doing this about once a day with last attempt earlier this morning.  On bladder scan by nursing staff today, patient has over 400 MLS of urine in the bladder.  Patient able to self catheterize and remove large amount of urine during ED stay.  Patient has chronic history of bladder dysfunction requiring both bladder pacemaker and self-catheterization and this is not a new issue, encouraged pt to do more frequent urinary catheterizations at home in order to  prevent urinary retention and sequelae from this.  Overall,  patient's lab work today reassuring.  She does have a mild hypokalemia likely due to poor nutrition in the setting of recent missed tube feeds but no AKI.  No other significant electrolyte disturbance, severe dehydration or malnutrition, or findings on blood work today.  UA is nitrite positive with small amount of leukocytes but does have a fair number of epithelial cells to question contamination.  Urine sent for culture.  Discussed all findings with patient and friend at bedside as well as need for strict outpatient follow-up with both GI, PCP, and bladder specialist.  Patient and friend expressed understanding of this plan.  Discussed with patient and friends at that she did feeding to become dislodged or patient have further concern for this in the future, it may be more beneficial to present to either [facility] or [facility] as they likely have better assortment of extensions for further evaluation of her G-tube.  Patient given strict ED return precautions, all questions answered, patient stable for discharge   Clinical Impression:  1. Feeding tube dysfunction, initial encounter   2. Hypokalemia   3. Dehydration   4. Other chronic pain   5. Urinary retention      Discharge           Final Clinical Impression(s) / ED Diagnoses Final diagnoses:  Hypokalemia  Dehydration  Feeding tube dysfunction, initial encounter  Other chronic pain  Urinary retention    Rx / DC Orders ED Discharge Orders     None         Suzzette Righter, PA-C 06/13/22 1843    Fredia Sorrow, MD 06/13/22 2320

## 2022-06-13 NOTE — ED Notes (Signed)
Family /friend understood discharge instructions

## 2022-06-16 LAB — URINE CULTURE: Culture: >=100000 — AB

## 2022-06-17 ENCOUNTER — Telehealth (HOSPITAL_BASED_OUTPATIENT_CLINIC_OR_DEPARTMENT_OTHER): Payer: Self-pay | Admitting: *Deleted

## 2022-06-17 NOTE — Progress Notes (Signed)
ED Antimicrobial Stewardship Positive Culture Follow Up   [name] is an 47 y.o. female who presented to [facility] on 06/13/2022 with a chief complaint of  Chief Complaint  Patient presents with   feeding tube problems    Recent Results (from the past 720 hour(s))  Urine Culture (for pregnant, neutropenic or urologic patients or patients with an indwelling urinary catheter)     Status: Abnormal   Collection Time: 06/13/22  6:14 PM   Specimen: Urine, Catheterized  Result Value Ref Range Status   Specimen Description   Final    URINE, CATHETERIZED Performed at [facility], [address] [place], [place] [address]    Special Requests   Final    NONE Performed at [facility], [address] [place], [place] [address]    Culture >=100,000 COLONIES/mL KLEBSIELLA PNEUMONIAE (A)  Final   Report Status 06/16/2022 FINAL  Final   Organism ID, Bacteria KLEBSIELLA PNEUMONIAE (A)  Final      Susceptibility   Klebsiella pneumoniae - MIC*    AMPICILLIN RESISTANT Resistant     CEFAZOLIN <=4 SENSITIVE Sensitive     CEFEPIME <=0.12 SENSITIVE Sensitive     CEFTRIAXONE <=0.25 SENSITIVE Sensitive     CIPROFLOXACIN <=0.25 SENSITIVE Sensitive     GENTAMICIN <=1 SENSITIVE Sensitive     IMIPENEM <=0.25 SENSITIVE Sensitive     NITROFURANTOIN 64 INTERMEDIATE Intermediate     TRIMETH/SULFA <=20 SENSITIVE Sensitive     AMPICILLIN/SULBACTAM <=2 SENSITIVE Sensitive     PIP/TAZO <=4 SENSITIVE Sensitive     * >=100,000 COLONIES/mL KLEBSIELLA PNEUMONIAE    '[x]'$  Patient discharged originally without antimicrobial agent and treatment may be indicated  If patient has no symptoms -- then do not treat.   If patient does have symptoms --  New antibiotic prescription: Keflex '500mg'$  BID  x 7 days.   ED Provider: Varney Biles, MD    Ventura Sellers 06/17/2022, 11:17 AM Clinical Pharmacist Monday - Friday phone -  [phone] Saturday - Sunday phone - [phone]

## 2022-06-17 NOTE — Telephone Encounter (Signed)
Post ED Visit - Positive Culture Follow-up: Successful Patient Follow-Up  Culture assessed and recommendations reviewed by: . '[]'$  Heide Guile, Pharm.D., BCPS AQ-ID '[]'$  Parks Neptune, Pharm.D., BCPS '[]'$  Alycia Rossetti, Pharm.D., BCPS '[]'$  Garvin, Pharm.D., BCPS, AAHIVP '[]'$  Legrand Como, Pharm.D., BCPS, AAHIVP '[]'$  Salome Arnt, PharmD, BCPS '[]'$  Johnnette Gourd, PharmD, BCPS '[]'$  Hughes Better, PharmD, BCPS '[x]'$  Esmeralda Arthur, PharmD  Positive urine culture  '[x]'$  Patient discharged without antimicrobial prescription and treatment is now indicated '[]'$  Organism is resistant to prescribed ED discharge antimicrobial '[]'$  Patient with positive blood cultures   Pt having urinary retention, pain  Changes discussed with ED provider: Varney Biles New antibiotic prescription Keflex '500mg'$  BID x 7 days Called to [facility] in [place], [place] patient, date 06/17/22, time 1216   Rosie Fate 06/17/2022, 12:17 PM

## 2022-06-24 ENCOUNTER — Emergency Department (HOSPITAL_COMMUNITY): Payer: BC Managed Care – PPO

## 2022-06-24 ENCOUNTER — Other Ambulatory Visit: Payer: Self-pay

## 2022-06-24 ENCOUNTER — Encounter (HOSPITAL_COMMUNITY): Payer: Self-pay

## 2022-06-24 ENCOUNTER — Emergency Department (HOSPITAL_COMMUNITY)
Admission: EM | Admit: 2022-06-24 | Discharge: 2022-06-25 | Disposition: A | Discharge status: Home / Self Care | Payer: BC Managed Care – PPO | Attending: Emergency Medicine | Admitting: Emergency Medicine

## 2022-06-24 DIAGNOSIS — S32050A Wedge compression fracture of fifth lumbar vertebra, initial encounter for closed fracture: Secondary | ICD-10-CM

## 2022-06-24 DIAGNOSIS — R519 Headache, unspecified: Secondary | ICD-10-CM | POA: Diagnosis present

## 2022-06-24 DIAGNOSIS — M4856XA Collapsed vertebra, not elsewhere classified, lumbar region, initial encounter for fracture: Secondary | ICD-10-CM | POA: Insufficient documentation

## 2022-06-24 DIAGNOSIS — G43909 Migraine, unspecified, not intractable, without status migrainosus: Secondary | ICD-10-CM

## 2022-06-24 LAB — BASIC METABOLIC PANEL
Anion gap: 7 (ref 5–15)
BUN: 8 mg/dL (ref 6–20)
CO2: 24 mmol/L (ref 22–32)
Calcium: 8.8 mg/dL — ABNORMAL LOW (ref 8.9–10.3)
Chloride: 110 mmol/L (ref 98–111)
Creatinine, Ser: 0.72 mg/dL (ref 0.44–1.00)
GFR, Estimated: >60 mL/min (ref >60)
Glucose, Bld: 90 mg/dL (ref 70–99)
Potassium: 3.6 mmol/L (ref 3.5–5.1)
Sodium: 141 mmol/L (ref 135–145)

## 2022-06-24 LAB — CBC
HCT: 35.7 % — ABNORMAL LOW (ref 36.0–46.0)
Hemoglobin: 11.9 g/dL — ABNORMAL LOW (ref 12.0–15.0)
MCH: 30.6 pg (ref 26.0–34.0)
MCHC: 33.3 g/dL (ref 30.0–36.0)
MCV: 91.8 fL (ref 80.0–100.0)
Platelets: 384 10*3/uL (ref 150–400)
RBC: 3.89 MIL/uL (ref 3.87–5.11)
RDW: 12.8 % (ref 11.5–15.5)
WBC: 10 10*3/uL (ref 4.0–10.5)
nRBC: 0 % (ref 0.0–0.2)

## 2022-06-24 MED ORDER — DIPHENHYDRAMINE HCL 50 MG/ML IJ SOLN
25.0000 mg | Freq: Once | INTRAMUSCULAR | Status: AC
  Administered 2022-06-24: 25 mg via INTRAVENOUS
  Filled 2022-06-24: qty 1

## 2022-06-24 MED ORDER — SODIUM CHLORIDE 0.9 % IV BOLUS
1000.0000 mL | Freq: Once | INTRAVENOUS | Status: AC
  Administered 2022-06-24: 1000 mL via INTRAVENOUS

## 2022-06-24 MED ORDER — DEXAMETHASONE SODIUM PHOSPHATE 10 MG/ML IJ SOLN
10.0000 mg | Freq: Once | INTRAMUSCULAR | Status: AC
  Administered 2022-06-24: 10 mg via INTRAVENOUS
  Filled 2022-06-24: qty 1

## 2022-06-24 MED ORDER — MAGNESIUM SULFATE 2 GM/50ML IV SOLN
2.0000 g | INTRAVENOUS | Status: AC
  Administered 2022-06-24: 2 g via INTRAVENOUS
  Filled 2022-06-24: qty 50

## 2022-06-24 MED ORDER — PROCHLORPERAZINE EDISYLATE 10 MG/2ML IJ SOLN
10.0000 mg | Freq: Once | INTRAMUSCULAR | Status: AC
  Administered 2022-06-24: 10 mg via INTRAVENOUS
  Filled 2022-06-24: qty 2
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
## 2022-06-24 NOTE — Discharge Instructions (Signed)
You can use lidocaine patches for your back, tylenol or other over the counter medicines.  Drink plenty of clear liquids.  Your xray shows a very small fracture in your back - this is very common in people with back pain and needs not specific treatment - your family doctor can refer you to a back specialist if needed  Thank you for allowing [place] to treat you in the emergency department today.  After reviewing your examination and potential testing that was done it appears that you are safe to go home.  I would like for you to follow-up with your doctor within the next several days, have them obtain your results and follow-up with them to review all of these tests.  If you should develop severe or worsening symptoms return to the emergency department immediately

## 2022-06-24 NOTE — ED Triage Notes (Signed)
Pt reports migraine x 2 days and she would like a migraine cocktail.
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
## 2022-08-17 ENCOUNTER — Encounter (INDEPENDENT_AMBULATORY_CARE_PROVIDER_SITE_OTHER): Payer: Self-pay | Admitting: *Deleted

## 2022-09-01 ENCOUNTER — Emergency Department (HOSPITAL_COMMUNITY): Payer: BC Managed Care – PPO

## 2022-09-01 ENCOUNTER — Encounter (HOSPITAL_COMMUNITY): Payer: Self-pay | Admitting: Emergency Medicine

## 2022-09-01 ENCOUNTER — Other Ambulatory Visit: Payer: Self-pay

## 2022-09-01 ENCOUNTER — Inpatient Hospital Stay (HOSPITAL_COMMUNITY): Payer: BC Managed Care – PPO

## 2022-09-01 ENCOUNTER — Inpatient Hospital Stay (HOSPITAL_COMMUNITY)
Admission: EM | Admit: 2022-09-01 | Discharge: 2022-09-13 | DRG: 207 | Disposition: A | Discharge status: Home / Self Care | Payer: BC Managed Care – PPO | Attending: Surgery | Admitting: Surgery

## 2022-09-01 DIAGNOSIS — Z681 Body mass index (BMI) 19 or less, adult: Secondary | ICD-10-CM | POA: Diagnosis not present

## 2022-09-01 DIAGNOSIS — J939 Pneumothorax, unspecified: Secondary | ICD-10-CM | POA: Diagnosis present

## 2022-09-01 DIAGNOSIS — N3010 Interstitial cystitis (chronic) without hematuria: Secondary | ICD-10-CM | POA: Diagnosis present

## 2022-09-01 DIAGNOSIS — G4730 Sleep apnea, unspecified: Secondary | ICD-10-CM | POA: Diagnosis present

## 2022-09-01 DIAGNOSIS — Z7951 Long term (current) use of inhaled steroids: Secondary | ICD-10-CM

## 2022-09-01 DIAGNOSIS — R6521 Severe sepsis with septic shock: Secondary | ICD-10-CM | POA: Diagnosis present

## 2022-09-01 DIAGNOSIS — K219 Gastro-esophageal reflux disease without esophagitis: Secondary | ICD-10-CM | POA: Diagnosis present

## 2022-09-01 DIAGNOSIS — A419 Sepsis, unspecified organism: Secondary | ICD-10-CM | POA: Diagnosis present

## 2022-09-01 DIAGNOSIS — Z86010 Personal history of colonic polyps: Secondary | ICD-10-CM

## 2022-09-01 DIAGNOSIS — G43909 Migraine, unspecified, not intractable, without status migrainosus: Secondary | ICD-10-CM | POA: Diagnosis present

## 2022-09-01 DIAGNOSIS — F17210 Nicotine dependence, cigarettes, uncomplicated: Secondary | ICD-10-CM | POA: Diagnosis present

## 2022-09-01 DIAGNOSIS — Z931 Gastrostomy status: Secondary | ICD-10-CM | POA: Diagnosis not present

## 2022-09-01 DIAGNOSIS — K860 Alcohol-induced chronic pancreatitis: Secondary | ICD-10-CM | POA: Diagnosis present

## 2022-09-01 DIAGNOSIS — E785 Hyperlipidemia, unspecified: Secondary | ICD-10-CM | POA: Diagnosis present

## 2022-09-01 DIAGNOSIS — G4700 Insomnia, unspecified: Secondary | ICD-10-CM | POA: Diagnosis present

## 2022-09-01 DIAGNOSIS — M797 Fibromyalgia: Secondary | ICD-10-CM | POA: Diagnosis present

## 2022-09-01 DIAGNOSIS — F319 Bipolar disorder, unspecified: Secondary | ICD-10-CM | POA: Diagnosis present

## 2022-09-01 DIAGNOSIS — Y92410 Unspecified street and highway as the place of occurrence of the external cause: Secondary | ICD-10-CM | POA: Diagnosis not present

## 2022-09-01 DIAGNOSIS — K852 Alcohol induced acute pancreatitis without necrosis or infection: Secondary | ICD-10-CM | POA: Diagnosis not present

## 2022-09-01 DIAGNOSIS — J9811 Atelectasis: Secondary | ICD-10-CM | POA: Diagnosis not present

## 2022-09-01 DIAGNOSIS — I10 Essential (primary) hypertension: Secondary | ICD-10-CM | POA: Diagnosis present

## 2022-09-01 DIAGNOSIS — J189 Pneumonia, unspecified organism: Secondary | ICD-10-CM | POA: Diagnosis present

## 2022-09-01 DIAGNOSIS — R079 Chest pain, unspecified: Secondary | ICD-10-CM | POA: Diagnosis not present

## 2022-09-01 DIAGNOSIS — R1310 Dysphagia, unspecified: Secondary | ICD-10-CM | POA: Diagnosis present

## 2022-09-01 DIAGNOSIS — R4182 Altered mental status, unspecified: Principal | ICD-10-CM

## 2022-09-01 DIAGNOSIS — M1990 Unspecified osteoarthritis, unspecified site: Secondary | ICD-10-CM | POA: Diagnosis present

## 2022-09-01 DIAGNOSIS — Z9049 Acquired absence of other specified parts of digestive tract: Secondary | ICD-10-CM

## 2022-09-01 DIAGNOSIS — R200 Anesthesia of skin: Secondary | ICD-10-CM | POA: Diagnosis present

## 2022-09-01 DIAGNOSIS — S270XXA Traumatic pneumothorax, initial encounter: Secondary | ICD-10-CM | POA: Diagnosis present

## 2022-09-01 DIAGNOSIS — M7989 Other specified soft tissue disorders: Secondary | ICD-10-CM | POA: Diagnosis not present

## 2022-09-01 DIAGNOSIS — R4781 Slurred speech: Secondary | ICD-10-CM | POA: Diagnosis present

## 2022-09-01 DIAGNOSIS — J440 Chronic obstructive pulmonary disease with acute lower respiratory infection: Secondary | ICD-10-CM | POA: Diagnosis present

## 2022-09-01 DIAGNOSIS — J439 Emphysema, unspecified: Secondary | ICD-10-CM | POA: Diagnosis present

## 2022-09-01 DIAGNOSIS — Z5901 Sheltered homelessness: Secondary | ICD-10-CM

## 2022-09-01 DIAGNOSIS — M79631 Pain in right forearm: Secondary | ICD-10-CM | POA: Diagnosis not present

## 2022-09-01 DIAGNOSIS — Z950 Presence of cardiac pacemaker: Secondary | ICD-10-CM

## 2022-09-01 DIAGNOSIS — R569 Unspecified convulsions: Secondary | ICD-10-CM | POA: Diagnosis present

## 2022-09-01 DIAGNOSIS — F19139 Other psychoactive substance abuse with withdrawal, unspecified: Secondary | ICD-10-CM | POA: Diagnosis not present

## 2022-09-01 DIAGNOSIS — G8929 Other chronic pain: Secondary | ICD-10-CM | POA: Diagnosis present

## 2022-09-01 DIAGNOSIS — F410 Panic disorder [episodic paroxysmal anxiety] without agoraphobia: Secondary | ICD-10-CM | POA: Diagnosis present

## 2022-09-01 DIAGNOSIS — J9601 Acute respiratory failure with hypoxia: Secondary | ICD-10-CM | POA: Diagnosis present

## 2022-09-01 DIAGNOSIS — Z885 Allergy status to narcotic agent status: Secondary | ICD-10-CM

## 2022-09-01 DIAGNOSIS — E876 Hypokalemia: Secondary | ICD-10-CM | POA: Diagnosis present

## 2022-09-01 DIAGNOSIS — Z79890 Hormone replacement therapy (postmenopausal): Secondary | ICD-10-CM

## 2022-09-01 DIAGNOSIS — R29898 Other symptoms and signs involving the musculoskeletal system: Secondary | ICD-10-CM | POA: Diagnosis not present

## 2022-09-01 DIAGNOSIS — E43 Unspecified severe protein-calorie malnutrition: Secondary | ICD-10-CM | POA: Diagnosis present

## 2022-09-01 DIAGNOSIS — Z8249 Family history of ischemic heart disease and other diseases of the circulatory system: Secondary | ICD-10-CM

## 2022-09-01 DIAGNOSIS — Z90710 Acquired absence of both cervix and uterus: Secondary | ICD-10-CM

## 2022-09-01 DIAGNOSIS — S2231XA Fracture of one rib, right side, initial encounter for closed fracture: Secondary | ICD-10-CM | POA: Diagnosis present

## 2022-09-01 DIAGNOSIS — F419 Anxiety disorder, unspecified: Secondary | ICD-10-CM

## 2022-09-01 DIAGNOSIS — Z888 Allergy status to other drugs, medicaments and biological substances status: Secondary | ICD-10-CM

## 2022-09-01 DIAGNOSIS — Z79899 Other long term (current) drug therapy: Secondary | ICD-10-CM

## 2022-09-01 DIAGNOSIS — M5431 Sciatica, right side: Secondary | ICD-10-CM | POA: Diagnosis present

## 2022-09-01 DIAGNOSIS — Z8541 Personal history of malignant neoplasm of cervix uteri: Secondary | ICD-10-CM

## 2022-09-01 DIAGNOSIS — F1910 Other psychoactive substance abuse, uncomplicated: Secondary | ICD-10-CM | POA: Diagnosis not present

## 2022-09-01 DIAGNOSIS — Z8719 Personal history of other diseases of the digestive system: Secondary | ICD-10-CM

## 2022-09-01 DIAGNOSIS — Z87442 Personal history of urinary calculi: Secondary | ICD-10-CM

## 2022-09-01 DIAGNOSIS — Z825 Family history of asthma and other chronic lower respiratory diseases: Secondary | ICD-10-CM

## 2022-09-01 LAB — BLOOD GAS, ARTERIAL
Acid-Base Excess: 1.5 mmol/L (ref 0.0–2.0)
Bicarbonate: 26.6 mmol/L (ref 20.0–28.0)
Drawn by: 38235
FIO2: 60 %
O2 Saturation: 96.6 %
Patient temperature: 35.5
pCO2 arterial: 40 mmHg (ref 32–48)
pH, Arterial: 7.42 (ref 7.35–7.45)
pO2, Arterial: 65 mmHg — ABNORMAL LOW (ref 83–108)

## 2022-09-01 LAB — CBC WITH DIFFERENTIAL/PLATELET
Abs Immature Granulocytes: 0.1 10*3/uL — ABNORMAL HIGH (ref 0.00–0.07)
Basophils Absolute: 0.1 10*3/uL (ref 0.0–0.1)
Basophils Relative: 1 %
Eosinophils Absolute: 0.2 10*3/uL (ref 0.0–0.5)
Eosinophils Relative: 1 %
HCT: 39 % (ref 36.0–46.0)
Hemoglobin: 12.9 g/dL (ref 12.0–15.0)
Immature Granulocytes: 1 %
Lymphocytes Relative: 37 %
Lymphs Abs: 5 10*3/uL — ABNORMAL HIGH (ref 0.7–4.0)
MCH: 30.5 pg (ref 26.0–34.0)
MCHC: 33.1 g/dL (ref 30.0–36.0)
MCV: 92.2 fL (ref 80.0–100.0)
Monocytes Absolute: 1.1 10*3/uL — ABNORMAL HIGH (ref 0.1–1.0)
Monocytes Relative: 8 %
Neutro Abs: 6.9 10*3/uL (ref 1.7–7.7)
Neutrophils Relative %: 52 %
Platelets: 415 10*3/uL — ABNORMAL HIGH (ref 150–400)
RBC: 4.23 MIL/uL (ref 3.87–5.11)
RDW: 13.1 % (ref 11.5–15.5)
WBC: 13.3 10*3/uL — ABNORMAL HIGH (ref 4.0–10.5)
nRBC: 0 % (ref 0.0–0.2)

## 2022-09-01 LAB — PROTIME-INR
INR: 1 (ref 0.8–1.2)
Prothrombin Time: 13 seconds (ref 11.4–15.2)

## 2022-09-01 LAB — COMPREHENSIVE METABOLIC PANEL
ALT: 17 U/L (ref 0–44)
AST: 28 U/L (ref 15–41)
Albumin: 4.5 g/dL (ref 3.5–5.0)
Alkaline Phosphatase: 94 U/L (ref 38–126)
Anion gap: 10 (ref 5–15)
BUN: 6 mg/dL (ref 6–20)
CO2: 26 mmol/L (ref 22–32)
Calcium: 9.1 mg/dL (ref 8.9–10.3)
Chloride: 101 mmol/L (ref 98–111)
Creatinine, Ser: 0.67 mg/dL (ref 0.44–1.00)
GFR, Estimated: >60 mL/min (ref >60)
Glucose, Bld: 111 mg/dL — ABNORMAL HIGH (ref 70–99)
Potassium: 3.1 mmol/L — ABNORMAL LOW (ref 3.5–5.1)
Sodium: 137 mmol/L (ref 135–145)
Total Bilirubin: 0.8 mg/dL (ref 0.3–1.2)
Total Protein: 8.7 g/dL — ABNORMAL HIGH (ref 6.5–8.1)

## 2022-09-01 LAB — URINALYSIS, ROUTINE W REFLEX MICROSCOPIC
Bilirubin Urine: NEGATIVE
Glucose, UA: NEGATIVE mg/dL
Hgb urine dipstick: NEGATIVE
Ketones, ur: 20 mg/dL — AB
Nitrite: POSITIVE — AB
Protein, ur: 100 mg/dL — AB
Specific Gravity, Urine: 1.024 (ref 1.005–1.030)
pH: 6 (ref 5.0–8.0)

## 2022-09-01 LAB — GLUCOSE, CAPILLARY
Glucose-Capillary: 103 mg/dL — ABNORMAL HIGH (ref 70–99)
Glucose-Capillary: 106 mg/dL — ABNORMAL HIGH (ref 70–99)
Glucose-Capillary: 84 mg/dL (ref 70–99)
Glucose-Capillary: 86 mg/dL (ref 70–99)

## 2022-09-01 LAB — RAPID URINE DRUG SCREEN, HOSP PERFORMED
Amphetamines: POSITIVE — AB
Amphetamines: POSITIVE — AB
Barbiturates: NOT DETECTED
Barbiturates: NOT DETECTED
Benzodiazepines: POSITIVE — AB
Benzodiazepines: POSITIVE — AB
Cocaine: POSITIVE — AB
Cocaine: POSITIVE — AB
Opiates: NOT DETECTED
Opiates: NOT DETECTED
Tetrahydrocannabinol: POSITIVE — AB
Tetrahydrocannabinol: POSITIVE — AB

## 2022-09-01 LAB — TROPONIN I (HIGH SENSITIVITY): Troponin I (High Sensitivity): 3 ng/L (ref <18)

## 2022-09-01 LAB — HIV ANTIBODY (ROUTINE TESTING W REFLEX): HIV Screen 4th Generation wRfx: NONREACTIVE

## 2022-09-01 LAB — MRSA NEXT GEN BY PCR, NASAL: MRSA by PCR Next Gen: NOT DETECTED

## 2022-09-01 LAB — ETHANOL: Alcohol, Ethyl (B): <10 mg/dL (ref <10)

## 2022-09-01 MED ORDER — SODIUM CHLORIDE 0.9 % IV SOLN: INTRAVENOUS | Status: AC

## 2022-09-01 MED ORDER — ENOXAPARIN SODIUM 30 MG/0.3ML IJ SOSY
30.0000 mg | PREFILLED_SYRINGE | Freq: Two times a day (BID) | INTRAMUSCULAR | Status: DC
  Administered 2022-09-02 – 2022-09-13 (×23): 30 mg via SUBCUTANEOUS
  Filled 2022-09-01 (×24): qty 0.3

## 2022-09-01 MED ORDER — ACETAMINOPHEN 325 MG PO TABS
650.0000 mg | ORAL_TABLET | Freq: Four times a day (QID) | ORAL | Status: DC
  Administered 2022-09-01 – 2022-09-05 (×16): 650 mg
  Filled 2022-09-01 (×16): qty 2

## 2022-09-01 MED ORDER — SODIUM CHLORIDE 0.9 % IV BOLUS
500.0000 mL | Freq: Once | INTRAVENOUS | Status: AC
  Administered 2022-09-01: 500 mL via INTRAVENOUS

## 2022-09-01 MED ORDER — OXYCODONE HCL 5 MG PO TABS
5.0000 mg | ORAL_TABLET | Freq: Four times a day (QID) | ORAL | Status: DC
  Administered 2022-09-01 – 2022-09-02 (×4): 5 mg
  Filled 2022-09-01 (×4): qty 1

## 2022-09-01 MED ORDER — CHLORHEXIDINE GLUCONATE CLOTH 2 % EX PADS
6.0000 | MEDICATED_PAD | Freq: Every day | CUTANEOUS | Status: DC
  Administered 2022-09-01 – 2022-09-13 (×13): 6 via TOPICAL

## 2022-09-01 MED ORDER — METHOCARBAMOL 500 MG PO TABS
500.0000 mg | ORAL_TABLET | Freq: Three times a day (TID) | ORAL | Status: AC
  Administered 2022-09-01 – 2022-09-03 (×6): 500 mg
  Filled 2022-09-01 (×6): qty 1

## 2022-09-01 MED ORDER — POTASSIUM CHLORIDE 10 MEQ/100ML IV SOLN
10.0000 meq | INTRAVENOUS | Status: AC
  Administered 2022-09-01 (×2): 10 meq via INTRAVENOUS
  Filled 2022-09-01 (×2): qty 100

## 2022-09-01 MED ORDER — IOHEXOL 300 MG/ML  SOLN
100.0000 mL | Freq: Once | INTRAMUSCULAR | Status: AC | PRN
  Administered 2022-09-01: 100 mL via INTRAVENOUS

## 2022-09-01 MED ORDER — DOCUSATE SODIUM 50 MG/5ML PO LIQD
100.0000 mg | Freq: Two times a day (BID) | ORAL | Status: DC
  Administered 2022-09-01 – 2022-09-04 (×8): 100 mg
  Filled 2022-09-01 (×8): qty 10

## 2022-09-01 MED ORDER — SUCCINYLCHOLINE CHLORIDE 200 MG/10ML IV SOSY
PREFILLED_SYRINGE | INTRAVENOUS | Status: AC
  Administered 2022-09-01: 150 mg via INTRAVENOUS
  Filled 2022-09-01: qty 10

## 2022-09-01 MED ORDER — LACTATED RINGERS IV SOLN: INTRAVENOUS | Status: DC

## 2022-09-01 MED ORDER — HYDRALAZINE HCL 20 MG/ML IJ SOLN: 10.0000 mg | INTRAMUSCULAR | Status: DC | PRN

## 2022-09-01 MED ORDER — OLANZAPINE 5 MG PO TABS
5.0000 mg | ORAL_TABLET | Freq: Every day | ORAL | Status: DC
  Administered 2022-09-01 – 2022-09-04 (×4): 5 mg
  Filled 2022-09-01 (×5): qty 1

## 2022-09-01 MED ORDER — METHOCARBAMOL 1000 MG/10ML IJ SOLN
500.0000 mg | Freq: Three times a day (TID) | INTRAVENOUS | Status: AC
  Administered 2022-09-03 – 2022-09-04 (×3): 500 mg via INTRAVENOUS
  Filled 2022-09-01 (×3): qty 500
  Filled 2022-09-01 (×2): qty 5

## 2022-09-01 MED ORDER — SODIUM CHLORIDE 0.9 % IV SOLN: INTRAVENOUS | Status: DC | PRN

## 2022-09-01 MED ORDER — ONDANSETRON HCL 4 MG/2ML IJ SOLN
4.0000 mg | Freq: Four times a day (QID) | INTRAMUSCULAR | Status: DC | PRN
  Administered 2022-09-07 – 2022-09-13 (×3): 4 mg via INTRAVENOUS
  Filled 2022-09-01 (×4): qty 2

## 2022-09-01 MED ORDER — ETOMIDATE 2 MG/ML IV SOLN: 20.0000 mg | Freq: Once | INTRAVENOUS | Status: AC

## 2022-09-01 MED ORDER — POLYETHYLENE GLYCOL 3350 17 G PO PACK: 17.0000 g | PACK | Freq: Every day | ORAL | Status: DC | PRN

## 2022-09-01 MED ORDER — HYDROMORPHONE HCL 1 MG/ML IJ SOLN
1.0000 mg | INTRAMUSCULAR | Status: DC | PRN
  Administered 2022-09-01: 1 mg via INTRAVENOUS
  Filled 2022-09-01 (×2): qty 1

## 2022-09-01 MED ORDER — THIAMINE MONONITRATE 100 MG PO TABS
100.0000 mg | ORAL_TABLET | Freq: Every day | ORAL | Status: DC
  Administered 2022-09-01: 100 mg
  Filled 2022-09-01: qty 1

## 2022-09-01 MED ORDER — PROPOFOL 1000 MG/100ML IV EMUL
INTRAVENOUS | Status: AC
  Administered 2022-09-01: 5 ug/kg/min via INTRAVENOUS
  Filled 2022-09-01: qty 100

## 2022-09-01 MED ORDER — DOCUSATE SODIUM 100 MG PO CAPS: 100.0000 mg | ORAL_CAPSULE | Freq: Two times a day (BID) | ORAL | Status: DC

## 2022-09-01 MED ORDER — CLONAZEPAM 0.5 MG PO TABS
0.5000 mg | ORAL_TABLET | Freq: Three times a day (TID) | ORAL | Status: DC
  Administered 2022-09-01 (×3): 0.5 mg
  Filled 2022-09-01 (×3): qty 1

## 2022-09-01 MED ORDER — POLYETHYLENE GLYCOL 3350 17 G PO PACK
17.0000 g | PACK | Freq: Every day | ORAL | Status: DC
  Administered 2022-09-01 – 2022-09-04 (×4): 17 g
  Filled 2022-09-01 (×4): qty 1

## 2022-09-01 MED ORDER — PROPOFOL 1000 MG/100ML IV EMUL
5.0000 ug/kg/min | INTRAVENOUS | Status: DC
  Administered 2022-09-01: 60 ug/kg/min via INTRAVENOUS
  Administered 2022-09-01: 21 ug/kg/min via INTRAVENOUS
  Filled 2022-09-01: qty 100

## 2022-09-01 MED ORDER — ETOMIDATE 2 MG/ML IV SOLN
INTRAVENOUS | Status: AC
  Administered 2022-09-01: 20 mg via INTRAVENOUS
  Filled 2022-09-01: qty 20

## 2022-09-01 MED ORDER — ONDANSETRON 4 MG PO TBDP: 4.0000 mg | ORAL_TABLET | Freq: Four times a day (QID) | ORAL | Status: DC | PRN

## 2022-09-01 MED ORDER — PROPOFOL 1000 MG/100ML IV EMUL
5.0000 ug/kg/min | INTRAVENOUS | Status: DC
  Administered 2022-09-01: 50 ug/kg/min via INTRAVENOUS
  Administered 2022-09-01: 70 ug/kg/min via INTRAVENOUS
  Administered 2022-09-01: 50 ug/kg/min via INTRAVENOUS
  Administered 2022-09-02: 70 ug/kg/min via INTRAVENOUS
  Administered 2022-09-02: 75 ug/kg/min via INTRAVENOUS
  Administered 2022-09-02: 60 ug/kg/min via INTRAVENOUS
  Administered 2022-09-02: 80 ug/kg/min via INTRAVENOUS
  Administered 2022-09-03: 40 ug/kg/min via INTRAVENOUS
  Administered 2022-09-03: 60 ug/kg/min via INTRAVENOUS
  Administered 2022-09-03 – 2022-09-04 (×2): 30 ug/kg/min via INTRAVENOUS
  Administered 2022-09-05: 10 ug/kg/min via INTRAVENOUS
  Filled 2022-09-01 (×11): qty 100

## 2022-09-01 MED ORDER — ORAL CARE MOUTH RINSE
15.0000 mL | OROMUCOSAL | Status: DC
  Administered 2022-09-01 – 2022-09-05 (×49): 15 mL via OROMUCOSAL

## 2022-09-01 MED ORDER — GABAPENTIN 250 MG/5ML PO SOLN
400.0000 mg | Freq: Four times a day (QID) | ORAL | Status: DC
  Administered 2022-09-01 – 2022-09-05 (×15): 400 mg
  Filled 2022-09-01 (×17): qty 8

## 2022-09-01 MED ORDER — QUETIAPINE FUMARATE 100 MG PO TABS
400.0000 mg | ORAL_TABLET | Freq: Every day | ORAL | Status: DC
  Administered 2022-09-01: 400 mg
  Filled 2022-09-01: qty 4

## 2022-09-01 MED ORDER — HYDROMORPHONE HCL 1 MG/ML IJ SOLN
1.0000 mg | INTRAMUSCULAR | Status: DC | PRN
  Administered 2022-09-01: 2 mg via INTRAVENOUS
  Administered 2022-09-01 (×2): 3 mg via INTRAVENOUS
  Administered 2022-09-02 – 2022-09-03 (×7): 2 mg via INTRAVENOUS
  Filled 2022-09-01: qty 3
  Filled 2022-09-01 (×3): qty 2
  Filled 2022-09-01: qty 3
  Filled 2022-09-01 (×6): qty 2

## 2022-09-01 MED ORDER — SUCCINYLCHOLINE CHLORIDE 200 MG/10ML IV SOSY: 150.0000 mg | PREFILLED_SYRINGE | Freq: Once | INTRAVENOUS | Status: AC

## 2022-09-01 MED ORDER — ORAL CARE MOUTH RINSE: 15.0000 mL | OROMUCOSAL | Status: DC | PRN

## 2022-09-01 NOTE — ED Notes (Signed)
Dr. Judd Lien attempting to place left sided chest tube at this time.

## 2022-09-01 NOTE — Progress Notes (Signed)
Dr. Sheliah Hatch contacted  about patient needing more sedation. Verbal order obtained to increase propofol to 70 mcg's. Continuing to monitor patient closely.
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
## 2022-09-01 NOTE — Progress Notes (Signed)
Dr. Sheliah Hatch contacted about patients HR 130's and results of EKG. Troponin's X3 ordered. Will follow up and continue to monitor.

## 2022-09-01 NOTE — ED Notes (Signed)
X-ray tech at bedside performing CXR post chest tube insertion.
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
## 2022-09-01 NOTE — Progress Notes (Signed)
Turned peep to 0, O2 back up to 60%, and tidal volume down to 340 per Dr. Judd Lien. He is preparing to do a chest tube.
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
## 2022-09-01 NOTE — ED Notes (Signed)
Daughter updated as to patient's status. 

## 2022-09-01 NOTE — Progress Notes (Signed)
Patient transported from ED to CT scan then back to ED room 2 on vent. No adverse events noted during trip.

## 2022-09-01 NOTE — H&P (Signed)
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 -Multimodal pain control, pulmonary toilet once extubated Acute hypoxic ventilator dependent respiratory failure -fully supported as we get her agitation under control, likely will be able to wean towards extubation tomorrow Bipolar disorder -Home meds Hypertension Emphysema History of chronic alcoholic pancreatitis Spinal cord stimulator in place FEN - IVF, replete hypokalemia, TF in AM if not extubated VTE - LMWH  Admit to ICU, trauma service Critical care 48 minutes  Liz Malady, MD 09/01/2022, 10:39 AM

## 2022-09-02 ENCOUNTER — Inpatient Hospital Stay (HOSPITAL_COMMUNITY): Payer: BC Managed Care – PPO

## 2022-09-02 ENCOUNTER — Other Ambulatory Visit: Payer: Self-pay

## 2022-09-02 LAB — CBC
HCT: 38.9 % (ref 36.0–46.0)
Hemoglobin: 12.4 g/dL (ref 12.0–15.0)
MCH: 29.9 pg (ref 26.0–34.0)
MCHC: 31.9 g/dL (ref 30.0–36.0)
MCV: 93.7 fL (ref 80.0–100.0)
Platelets: 309 10*3/uL (ref 150–400)
RBC: 4.15 MIL/uL (ref 3.87–5.11)
RDW: 13.3 % (ref 11.5–15.5)
WBC: 9.3 10*3/uL (ref 4.0–10.5)
nRBC: 0 % (ref 0.0–0.2)

## 2022-09-02 LAB — BASIC METABOLIC PANEL
Anion gap: 9 (ref 5–15)
BUN: <5 mg/dL — ABNORMAL LOW (ref 6–20)
CO2: 25 mmol/L (ref 22–32)
Calcium: 8.5 mg/dL — ABNORMAL LOW (ref 8.9–10.3)
Chloride: 107 mmol/L (ref 98–111)
Creatinine, Ser: 0.68 mg/dL (ref 0.44–1.00)
GFR, Estimated: >60 mL/min (ref >60)
Glucose, Bld: 94 mg/dL (ref 70–99)
Potassium: 3.2 mmol/L — ABNORMAL LOW (ref 3.5–5.1)
Sodium: 141 mmol/L (ref 135–145)

## 2022-09-02 LAB — GLUCOSE, CAPILLARY
Glucose-Capillary: 96 mg/dL (ref 70–99)
Glucose-Capillary: 91 mg/dL (ref 70–99)
Glucose-Capillary: 166 mg/dL — ABNORMAL HIGH (ref 70–99)
Glucose-Capillary: 64 mg/dL — ABNORMAL LOW (ref 70–99)
Glucose-Capillary: 140 mg/dL — ABNORMAL HIGH (ref 70–99)
Glucose-Capillary: 119 mg/dL — ABNORMAL HIGH (ref 70–99)
Glucose-Capillary: 189 mg/dL — ABNORMAL HIGH (ref 70–99)

## 2022-09-02 LAB — POCT I-STAT 7, (LYTES, BLD GAS, ICA,H+H)
Acid-base deficit: 2 mmol/L (ref 0.0–2.0)
Bicarbonate: 26.7 mmol/L (ref 20.0–28.0)
Calcium, Ion: 1.27 mmol/L (ref 1.15–1.40)
HCT: 35 % — ABNORMAL LOW (ref 36.0–46.0)
Hemoglobin: 11.9 g/dL — ABNORMAL LOW (ref 12.0–15.0)
O2 Saturation: 96 %
Patient temperature: 98.6
Potassium: 4.4 mmol/L (ref 3.5–5.1)
Sodium: 141 mmol/L (ref 135–145)
TCO2: 29 mmol/L (ref 22–32)
pCO2 arterial: 61.6 mmHg — ABNORMAL HIGH (ref 32–48)
pH, Arterial: 7.244 — ABNORMAL LOW (ref 7.35–7.45)
pO2, Arterial: 94 mmHg (ref 83–108)

## 2022-09-02 LAB — TRIGLYCERIDES: Triglycerides: 103 mg/dL (ref <150)

## 2022-09-02 LAB — PHOSPHORUS
Phosphorus: 4.5 mg/dL (ref 2.5–4.6)
Phosphorus: 2.4 mg/dL — ABNORMAL LOW (ref 2.5–4.6)

## 2022-09-02 LAB — TROPONIN I (HIGH SENSITIVITY): Troponin I (High Sensitivity): 3 ng/L (ref <18)

## 2022-09-02 LAB — MAGNESIUM: Magnesium: 1.7 mg/dL (ref 1.7–2.4)

## 2022-09-02 MED ORDER — ADULT MULTIVITAMIN W/MINERALS CH
1.0000 | ORAL_TABLET | Freq: Every day | ORAL | Status: DC
  Administered 2022-09-02 – 2022-09-04 (×3): 1
  Filled 2022-09-02 (×3): qty 1

## 2022-09-02 MED ORDER — ALBUMIN HUMAN 5 % IV SOLN
25.0000 g | Freq: Once | INTRAVENOUS | Status: AC
  Administered 2022-09-02: 25 g via INTRAVENOUS

## 2022-09-02 MED ORDER — SODIUM CHLORIDE 0.9% FLUSH: 10.0000 mL | INTRAVENOUS | Status: DC | PRN

## 2022-09-02 MED ORDER — SODIUM CHLORIDE 0.9% FLUSH
10.0000 mL | Freq: Two times a day (BID) | INTRAVENOUS | Status: DC
  Administered 2022-09-02 (×2): 10 mL
  Administered 2022-09-03: 20 mL
  Administered 2022-09-03 – 2022-09-05 (×5): 10 mL
  Administered 2022-09-06 (×2): 30 mL
  Administered 2022-09-07 – 2022-09-13 (×13): 10 mL

## 2022-09-02 MED ORDER — NOREPINEPHRINE 4 MG/250ML-% IV SOLN
INTRAVENOUS | Status: AC
  Administered 2022-09-02: 10 ug/min via INTRAVENOUS
  Filled 2022-09-02: qty 250

## 2022-09-02 MED ORDER — POTASSIUM CHLORIDE 20 MEQ PO PACK
40.0000 meq | PACK | Freq: Once | ORAL | Status: AC
  Administered 2022-09-02: 40 meq
  Filled 2022-09-02: qty 2

## 2022-09-02 MED ORDER — PROSOURCE TF20 ENFIT COMPATIBL EN LIQD
60.0000 mL | Freq: Every day | ENTERAL | Status: DC
  Administered 2022-09-02 – 2022-09-12 (×10): 60 mL
  Filled 2022-09-02 (×10): qty 60

## 2022-09-02 MED ORDER — FOLIC ACID 1 MG PO TABS
1.0000 mg | ORAL_TABLET | Freq: Every day | ORAL | Status: DC
  Administered 2022-09-02 – 2022-09-04 (×3): 1 mg
  Filled 2022-09-02 (×3): qty 1

## 2022-09-02 MED ORDER — VITAL HIGH PROTEIN PO LIQD: 1000.0000 mL | ORAL | Status: DC

## 2022-09-02 MED ORDER — SODIUM CHLORIDE 0.9 % IV SOLN
2.0000 g | Freq: Two times a day (BID) | INTRAVENOUS | Status: DC
  Administered 2022-09-02 – 2022-09-04 (×5): 2 g via INTRAVENOUS
  Filled 2022-09-02 (×6): qty 12.5

## 2022-09-02 MED ORDER — OSMOLITE 1.2 CAL PO LIQD
1000.0000 mL | ORAL | Status: DC
  Administered 2022-09-02 – 2022-09-06 (×5): 1000 mL
  Filled 2022-09-02 (×8): qty 1000

## 2022-09-02 MED ORDER — PANTOPRAZOLE SODIUM 40 MG IV SOLR
40.0000 mg | INTRAVENOUS | Status: DC
  Administered 2022-09-02 – 2022-09-04 (×3): 40 mg via INTRAVENOUS
  Filled 2022-09-02 (×3): qty 10

## 2022-09-02 MED ORDER — DEXTROSE 50 % IV SOLN
INTRAVENOUS | Status: AC
  Filled 2022-09-02: qty 50

## 2022-09-02 MED ORDER — OXYCODONE HCL 5 MG PO TABS
10.0000 mg | ORAL_TABLET | Freq: Four times a day (QID) | ORAL | Status: DC
  Administered 2022-09-02 – 2022-09-05 (×12): 10 mg
  Filled 2022-09-02 (×12): qty 2

## 2022-09-02 MED ORDER — CLONAZEPAM 1 MG PO TABS
1.0000 mg | ORAL_TABLET | Freq: Three times a day (TID) | ORAL | Status: DC
  Administered 2022-09-02 – 2022-09-06 (×15): 1 mg
  Filled 2022-09-02 (×15): qty 1

## 2022-09-02 MED ORDER — METOPROLOL TARTRATE 5 MG/5ML IV SOLN: 5.0000 mg | Freq: Once | INTRAVENOUS | Status: AC

## 2022-09-02 MED ORDER — QUETIAPINE FUMARATE 100 MG PO TABS
400.0000 mg | ORAL_TABLET | Freq: Two times a day (BID) | ORAL | Status: DC
  Administered 2022-09-02 – 2022-09-04 (×6): 400 mg
  Filled 2022-09-02 (×6): qty 4

## 2022-09-02 MED ORDER — METOPROLOL TARTRATE 5 MG/5ML IV SOLN
INTRAVENOUS | Status: AC
  Administered 2022-09-02: 5 mg via INTRAVENOUS
  Filled 2022-09-02: qty 5

## 2022-09-02 MED ORDER — ALBUMIN HUMAN 5 % IV SOLN
25.0000 g | Freq: Once | INTRAVENOUS | Status: AC
  Administered 2022-09-02: 25 g via INTRAVENOUS
  Filled 2022-09-02: qty 500

## 2022-09-02 MED ORDER — NOREPINEPHRINE 4 MG/250ML-% IV SOLN
0.0000 ug/min | INTRAVENOUS | Status: DC
  Administered 2022-09-02: 22 ug/min via INTRAVENOUS
  Administered 2022-09-02: 6 ug/min via INTRAVENOUS
  Administered 2022-09-03: 40 ug/min via INTRAVENOUS
  Administered 2022-09-03: 15 ug/min via INTRAVENOUS
  Filled 2022-09-02 (×4): qty 250

## 2022-09-02 MED ORDER — ALBUMIN HUMAN 25 % IV SOLN
INTRAVENOUS | Status: AC
  Filled 2022-09-02: qty 100

## 2022-09-02 MED ORDER — THIAMINE MONONITRATE 100 MG PO TABS
100.0000 mg | ORAL_TABLET | Freq: Every day | ORAL | Status: DC
  Administered 2022-09-02 – 2022-09-04 (×3): 100 mg
  Filled 2022-09-02 (×3): qty 1

## 2022-09-02 MED ORDER — METOPROLOL TARTRATE 5 MG/5ML IV SOLN
5.0000 mg | Freq: Once | INTRAVENOUS | Status: AC
  Administered 2022-09-02: 5 mg via INTRAVENOUS
  Filled 2022-09-02: qty 5

## 2022-09-02 MED ORDER — VASOPRESSIN 20 UNITS/100 ML INFUSION FOR SHOCK
INTRAVENOUS | Status: AC
  Filled 2022-09-02: qty 100
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
## 2022-09-02 NOTE — Progress Notes (Addendum)
Notified Irving Burton, RN trauma nurse of HR 140s, Bp 130/67 and off of levo now. Sats 88% on 40%fio2 on vent settings. Suctioned her Ett /inline and oral suction. Bilateral breath sounds Rhonchi all lobes.  Gave IV dilaudid  per order/see Emar. Ice packs applied for elevated temp and has scheduled Tylenol that she can have again at 2300.  Continuing to monitor.
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
## 2022-09-02 NOTE — Progress Notes (Signed)
Peripherally Inserted Central Catheter Placement  The IV Nurse has discussed with the patient and/or persons authorized to consent for the patient, the purpose of this procedure and the potential benefits and risks involved with this procedure.  The benefits include less needle sticks, lab draws from the catheter, and the patient may be discharged home with the catheter. Risks include, but not limited to, infection, bleeding, blood clot (thrombus formation), and puncture of an artery; nerve damage and irregular heartbeat and possibility to perform a PICC exchange if needed/ordered by physician.  Alternatives to this procedure were also discussed.  Bard Power PICC patient education guide, fact sheet on infection prevention and patient information card has been provided to patient /or left at bedside.  Telephone consent from daughter.  PICC Placement Documentation  PICC Triple Lumen 09/02/22 Left Brachial 38 cm 0 cm (Active)  Indication for Insertion or Continuance of Line Limited venous access - need for IV therapy >5 days (PICC only) 09/02/22 0947  Exposed Catheter (cm) 0 cm 09/02/22 0945  Site Assessment Clean, Dry, Intact 09/02/22 0947  Lumen #1 Status Infusing 09/02/22 0947  Lumen #2 Status Infusing 09/02/22 0947  Lumen #3 Status In-line blood sampling system in place 09/02/22 0947  Dressing Type Transparent 09/02/22 0947  Dressing Status Antimicrobial disc in place;Clean, Dry, Intact 09/02/22 0947  Safety Lock Not Applicable 09/02/22 0945  Line Care Connections checked and tightened 09/02/22 0947  Line Adjustment (NICU/IV Team Only) No 09/02/22 0945  Dressing Intervention New dressing 09/02/22 0945  Dressing Change Due 09/09/22 09/02/22 0947       Elliot Dally 09/02/2022, 9:48 AM

## 2022-09-02 NOTE — Progress Notes (Signed)
This RN picked up this pt  

## 2022-09-02 NOTE — Progress Notes (Addendum)
MD made aware of ST. Following order set. Other vitals stable and w/in order set parameters

## 2022-09-02 NOTE — Progress Notes (Signed)
Trauma Event Note    Reason for Call :  Tachycardia 150s-160s, hypotension 90/42, sats 88-90 on 40%FiO2 PEEP 8, Temp 102.4  Initial Focused Assessment:  Pt unresponsive on propofol 48mcg/kg/hr, received dilaudid 2mg  PRN prior to my arrival to bedside - prior per my report, pt has been agitated. Warm and dry, pulses bounding. LS diminished, rhonchi to auscultation. Presumed aspiration prior to her arrival, concern for PNA.   Interventions:  See event summary  Plan of Care:  Lopressor 5MG  push IV x1 to control rate, Dr. Janee Morn to place orders for cultures/PNA workup   Event Summary:  I received notification from primary RN Stacey at 2127 that patient was tachycardic in the 130s and was beginning to spike a fever. Has been receiving tylenol as scheduled, next dose due 2300. Pt had been having difficulty with agitation throughout the day, requiring higher doses of propofol, which lowered her BP and required low dose levophed. Pt had been weaned off of levophed earlier this shift and was normotensive. Had not received pain management yet this shift, so I recommended starting with PRN dilaudid while I notified MD.  When I arrived to bedside, pt HR worsened to 150s BP declining to 80s/90s. Levophed restarted. Dr. Janee Morn stated that pt had experienced HR 130s earlier today and received a one time dose of lopressor 5MG  with resolution, HR to the 90s. Reordered lopressor and stated that he would be placing more ordered for PNA workup after finishing a case in the OR. Primary RN Misty Stanley made aware.    MD Notified: Thompson notified at 2134, spoke again on the phone at 2149 Call Time:2127 Arrival Time:2147 End Time:2215  Last imported Vital Signs BP (!) 90/42   Pulse (!) 155   Temp (!) 102.4 F (39.1 C)   Resp 15   Ht 5' (1.524 m)   Wt 93 lb 14.7 oz (42.6 kg)   SpO2 90%   BMI 18.34 kg/m   Trending CBC Recent Labs    09/01/22 0621 09/02/22 0626 09/02/22 1406  WBC 13.3* 9.3  --    HGB 12.9 12.4 11.9*  HCT 39.0 38.9 35.0*  PLT 415* 309  --     Trending Coag's Recent Labs    09/01/22 0621  INR 1.0    Trending BMET Recent Labs    09/01/22 0621 09/02/22 0626 09/02/22 1406  NA 137 141 141  K 3.1* 3.2* 4.4  CL 101 107  --   CO2 26 25  --   BUN 6 <5*  --   CREATININE 0.67 0.68  --   GLUCOSE 111* 94  --       [name]  Trauma Response RN  Please call TRN at [phone] for further assistance.

## 2022-09-02 NOTE — Progress Notes (Signed)
Lewie Loron, RN trauma nurse at bedside to assess.

## 2022-09-02 NOTE — TOC CAGE-AID Note (Signed)
Transition of Care [facility]) - CAGE-AID Screening   Patient Details  Name: [name] MRN: [id number] Date of Birth: 02-19-1976   Judie Bonus, RN Phone Number: 09/02/2022, 5:19 AM   Clinical Narrative:  Pt unable to participate d/t intubation and sedation  CAGE-AID Screening: Substance Abuse Screening unable to be completed due to: : Patient unable to participate (Pt intubated and sedated)

## 2022-09-03 ENCOUNTER — Inpatient Hospital Stay (HOSPITAL_COMMUNITY): Payer: BC Managed Care – PPO

## 2022-09-03 LAB — PHOSPHORUS
Phosphorus: 1.9 mg/dL — ABNORMAL LOW (ref 2.5–4.6)
Phosphorus: 2.8 mg/dL (ref 2.5–4.6)

## 2022-09-03 LAB — CBC
HCT: 34.4 % — ABNORMAL LOW (ref 36.0–46.0)
Hemoglobin: 11.2 g/dL — ABNORMAL LOW (ref 12.0–15.0)
MCH: 30.2 pg (ref 26.0–34.0)
MCHC: 32.6 g/dL (ref 30.0–36.0)
MCV: 92.7 fL (ref 80.0–100.0)
Platelets: 249 10*3/uL (ref 150–400)
RBC: 3.71 MIL/uL — ABNORMAL LOW (ref 3.87–5.11)
RDW: 12.9 % (ref 11.5–15.5)
WBC: 12.9 10*3/uL — ABNORMAL HIGH (ref 4.0–10.5)
nRBC: 0 % (ref 0.0–0.2)

## 2022-09-03 LAB — GLUCOSE, CAPILLARY
Glucose-Capillary: 146 mg/dL — ABNORMAL HIGH (ref 70–99)
Glucose-Capillary: 165 mg/dL — ABNORMAL HIGH (ref 70–99)
Glucose-Capillary: 167 mg/dL — ABNORMAL HIGH (ref 70–99)
Glucose-Capillary: 133 mg/dL — ABNORMAL HIGH (ref 70–99)
Glucose-Capillary: 177 mg/dL — ABNORMAL HIGH (ref 70–99)
Glucose-Capillary: 128 mg/dL — ABNORMAL HIGH (ref 70–99)

## 2022-09-03 LAB — BASIC METABOLIC PANEL
Anion gap: 7 (ref 5–15)
BUN: <5 mg/dL — ABNORMAL LOW (ref 6–20)
CO2: 25 mmol/L (ref 22–32)
Calcium: 7.7 mg/dL — ABNORMAL LOW (ref 8.9–10.3)
Chloride: 101 mmol/L (ref 98–111)
Creatinine, Ser: 0.39 mg/dL — ABNORMAL LOW (ref 0.44–1.00)
GFR, Estimated: >60 mL/min (ref >60)
Glucose, Bld: 130 mg/dL — ABNORMAL HIGH (ref 70–99)
Potassium: 3.6 mmol/L (ref 3.5–5.1)
Sodium: 133 mmol/L — ABNORMAL LOW (ref 135–145)

## 2022-09-03 LAB — MAGNESIUM
Magnesium: 1.7 mg/dL (ref 1.7–2.4)
Magnesium: 1.6 mg/dL — ABNORMAL LOW (ref 1.7–2.4)

## 2022-09-03 MED ORDER — VASOPRESSIN 20 UNITS/100 ML INFUSION FOR SHOCK
0.0000 [IU]/min | INTRAVENOUS | Status: DC
  Administered 2022-09-03: 0.03 [IU]/min via INTRAVENOUS

## 2022-09-03 MED ORDER — SODIUM CHLORIDE 0.9 % IV BOLUS
1000.0000 mL | Freq: Once | INTRAVENOUS | Status: AC
  Administered 2022-09-03: 1000 mL via INTRAVENOUS

## 2022-09-03 MED ORDER — METOPROLOL TARTRATE 5 MG/5ML IV SOLN
5.0000 mg | Freq: Once | INTRAVENOUS | Status: AC
  Administered 2022-09-03: 5 mg via INTRAVENOUS
  Filled 2022-09-03: qty 5

## 2022-09-03 MED ORDER — NOREPINEPHRINE 16 MG/250ML-% IV SOLN
0.0000 ug/min | INTRAVENOUS | Status: DC
  Administered 2022-09-03: 8 ug/min via INTRAVENOUS
  Administered 2022-09-03: 5 ug/min via INTRAVENOUS
  Administered 2022-09-04: 7 ug/min via INTRAVENOUS
  Filled 2022-09-03 (×3): qty 250

## 2022-09-03 MED ORDER — MAGNESIUM SULFATE 2 GM/50ML IV SOLN
2.0000 g | Freq: Once | INTRAVENOUS | Status: AC
  Administered 2022-09-03: 2 g via INTRAVENOUS
  Filled 2022-09-03: qty 50

## 2022-09-03 MED ORDER — POTASSIUM PHOSPHATES 15 MMOLE/5ML IV SOLN
30.0000 mmol | Freq: Once | INTRAVENOUS | Status: AC
  Administered 2022-09-03: 30 mmol via INTRAVENOUS
  Filled 2022-09-03: qty 10

## 2022-09-03 MED ORDER — SODIUM CHLORIDE 0.9 % IV SOLN: INTRAVENOUS | Status: DC | PRN

## 2022-09-03 MED ORDER — IBUPROFEN 200 MG PO TABS
400.0000 mg | ORAL_TABLET | Freq: Once | ORAL | Status: AC
  Administered 2022-09-03: 400 mg
  Filled 2022-09-03: qty 2
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
## 2022-09-03 NOTE — Progress Notes (Signed)
Persistent hypotension in septic shock, started vasopressin and placed A-line, updated family at bedside.

## 2022-09-03 NOTE — Progress Notes (Signed)
Trauma Event Note   Called by primary RN, due to pt's heart rate increasing. Dr. Sheliah Hatch notified by primary RN, orders received for bolus of LR-- will watch heart rate.   TRN to follow--    Last imported Vital Signs BP (!) 109/54   Pulse (!) 121   Temp 97.9 F (36.6 C)   Resp 17   Ht 5' (1.524 m)   Wt 93 lb 14.7 oz (42.6 kg)   SpO2 95%   BMI 18.34 kg/m   Trending CBC Recent Labs    09/01/22 0621 09/02/22 0626 09/02/22 1406 09/03/22 0342  WBC 13.3* 9.3  --  12.9*  HGB 12.9 12.4 11.9* 11.2*  HCT 39.0 38.9 35.0* 34.4*  PLT 415* 309  --  249    Trending Coag's Recent Labs    09/01/22 0621  INR 1.0    Trending BMET Recent Labs    09/01/22 0621 09/02/22 0626 09/02/22 1406 09/03/22 0342  NA 137 141 141 133*  K 3.1* 3.2* 4.4 3.6  CL 101 107  --  101  CO2 26 25  --  25  BUN 6 <5*  --  <5*  CREATININE 0.67 0.68  --  0.39*  GLUCOSE 111* 94  --  130*      Stacey Sago M [name]  Trauma Response RN  Please call TRN at [phone] for further assistance.

## 2022-09-03 NOTE — Progress Notes (Signed)
Notified Dr. Janee Morn of Temp 103 and HR staying in 140s again. Tylenol was already given per order. Ice Packs applied to groins/armpit areas. See new order.

## 2022-09-03 NOTE — Progress Notes (Signed)
Trauma nurse Autumn, RN notified of HR 150s and sustaining.

## 2022-09-03 NOTE — Procedures (Signed)
Arterial Catheter Insertion Procedure Note  KRIZIA JAIMES  [id number]  03-20-1976  Date:09/03/22  Time:12:35 PM    Provider Performing: Hiram Comber    Procedure: Insertion of Arterial Line (40981) without US guidance  Indication(s) Blood pressure monitoring and/or need for frequent ABGs  Consent Unable to obtain consent due to emergent nature of procedure.  Anesthesia None   Time Out Verified patient identification, verified procedure, site/side was marked, verified correct patient position, special equipment/implants available, medications/allergies/relevant history reviewed, required imaging and test results available.   Sterile Technique Maximal sterile technique including full sterile barrier drape, hand hygiene, sterile gown, sterile gloves, mask, hair covering, sterile ultrasound probe cover (if used).   Procedure Description Area of catheter insertion was cleaned with chlorhexidine and draped in sterile fashion. Without real-time ultrasound guidance an arterial catheter was placed into the right radial artery.  Appropriate arterial tracings confirmed on monitor.     Complications/Tolerance None; patient tolerated the procedure well.   EBL Minimal   Specimen(s) None

## 2022-09-03 NOTE — Progress Notes (Signed)
Attempted to call Margarita Mail to update. No answer, no voicemail set up.

## 2022-09-04 LAB — CBC
HCT: 31.7 % — ABNORMAL LOW (ref 36.0–46.0)
Hemoglobin: 10.2 g/dL — ABNORMAL LOW (ref 12.0–15.0)
MCH: 30.1 pg (ref 26.0–34.0)
MCHC: 32.2 g/dL (ref 30.0–36.0)
MCV: 93.5 fL (ref 80.0–100.0)
Platelets: 266 10*3/uL (ref 150–400)
RBC: 3.39 MIL/uL — ABNORMAL LOW (ref 3.87–5.11)
RDW: 13.2 % (ref 11.5–15.5)
WBC: 20.2 10*3/uL — ABNORMAL HIGH (ref 4.0–10.5)
nRBC: 0 % (ref 0.0–0.2)

## 2022-09-04 LAB — GLUCOSE, CAPILLARY
Glucose-Capillary: 155 mg/dL — ABNORMAL HIGH (ref 70–99)
Glucose-Capillary: 146 mg/dL — ABNORMAL HIGH (ref 70–99)
Glucose-Capillary: 122 mg/dL — ABNORMAL HIGH (ref 70–99)
Glucose-Capillary: 138 mg/dL — ABNORMAL HIGH (ref 70–99)
Glucose-Capillary: 184 mg/dL — ABNORMAL HIGH (ref 70–99)
Glucose-Capillary: 142 mg/dL — ABNORMAL HIGH (ref 70–99)

## 2022-09-04 LAB — BASIC METABOLIC PANEL
Anion gap: 5 (ref 5–15)
BUN: 7 mg/dL (ref 6–20)
CO2: 29 mmol/L (ref 22–32)
Calcium: 7.7 mg/dL — ABNORMAL LOW (ref 8.9–10.3)
Chloride: 106 mmol/L (ref 98–111)
Creatinine, Ser: 0.51 mg/dL (ref 0.44–1.00)
GFR, Estimated: >60 mL/min (ref >60)
Glucose, Bld: 154 mg/dL — ABNORMAL HIGH (ref 70–99)
Potassium: 4.2 mmol/L (ref 3.5–5.1)
Sodium: 140 mmol/L (ref 135–145)

## 2022-09-04 LAB — MAGNESIUM: Magnesium: 2.1 mg/dL (ref 1.7–2.4)

## 2022-09-04 LAB — PHOSPHORUS: Phosphorus: 2.9 mg/dL (ref 2.5–4.6)

## 2022-09-04 MED ORDER — METOPROLOL TARTRATE 12.5 MG HALF TABLET
12.5000 mg | ORAL_TABLET | Freq: Two times a day (BID) | ORAL | Status: DC
  Administered 2022-09-04: 12.5 mg
  Filled 2022-09-04 (×2): qty 1

## 2022-09-04 MED ORDER — METHYLPREDNISOLONE SODIUM SUCC 125 MG IJ SOLR
125.0000 mg | Freq: Two times a day (BID) | INTRAMUSCULAR | Status: DC
  Administered 2022-09-04: 125 mg via INTRAVENOUS
  Filled 2022-09-04: qty 2

## 2022-09-04 MED ORDER — METHYLPREDNISOLONE SODIUM SUCC 125 MG IJ SOLR
60.0000 mg | Freq: Two times a day (BID) | INTRAMUSCULAR | Status: DC
  Administered 2022-09-04: 60 mg via INTRAVENOUS
  Filled 2022-09-04: qty 2

## 2022-09-04 MED ORDER — METOPROLOL TARTRATE 5 MG/5ML IV SOLN
5.0000 mg | INTRAVENOUS | Status: DC | PRN
  Administered 2022-09-04 – 2022-09-09 (×5): 5 mg via INTRAVENOUS
  Filled 2022-09-04 (×7): qty 5

## 2022-09-04 MED ORDER — DEXMEDETOMIDINE HCL IN NACL 400 MCG/100ML IV SOLN
0.0000 ug/kg/h | INTRAVENOUS | Status: DC
  Administered 2022-09-04: 0.9 ug/kg/h via INTRAVENOUS
  Administered 2022-09-04: 0.4 ug/kg/h via INTRAVENOUS
  Administered 2022-09-05: 0.3 ug/kg/h via INTRAVENOUS
  Administered 2022-09-05: 0.8 ug/kg/h via INTRAVENOUS
  Filled 2022-09-04 (×4): qty 100
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
## 2022-09-04 NOTE — Progress Notes (Signed)
Notified Trauma MD, Dr. Sheliah Hatch of HR 140s and to see if he wanted to make any changes to her sedation or collect a Lactic Acid. No new orders.

## 2022-09-04 NOTE — Plan of Care (Signed)
Pt sedation was weaned today and pt was able to follow commands, nod and gesture appropriately, and mouth some responses. Pt was given education on what care we are providing as well as medications. She is also educated on restraints that are going to remain until extubation. Family is also educated and informed on care that is being provided. Will continue to update pt and family as necessary.

## 2022-09-04 NOTE — Progress Notes (Signed)
Patient ID: [name], female   DOB: [birth year], 47 y.o.   MRN: [id number] I tried to call her daughter, [name], and it went to VM and VM is not set up on their phone. Will add precedex to help with tachycardia related to psychiatric issues and drug withdrawal.  Violeta Gelinas, MD, MPH, FACS Please use [email or web address] to contact on call provider

## 2022-09-04 NOTE — Progress Notes (Addendum)
Daughter had phone trouble and gave a different number to be reached at in case other wasn't working = [phone]  Pt roommate came by today to visit. Pt roommate states they live together in a camper. She took pt belongings with her. The pt belongings were clothes, id, jewelry.

## 2022-09-04 NOTE — Progress Notes (Signed)
..  Trauma Event Note    Reason for Call :  Notified by primary RN Misty Stanley of sustained HR 150's after all available meds given. Dr. Sheliah Hatch gave verbal for Metoprolol 5mg  IV. HR noted to be again trending up, pt is off Vaso, Levo at 11 per primary RN.  Dr. Sheliah Hatch again notified. Verbal order for Metoprolol 5mg  IV q4hrs PRN >130.  Will continue to follow.   Last imported Vital Signs BP 120/61   Pulse (!) 137   Temp 99.1 F (37.3 C)   Resp 17   Ht 5' (1.524 m)   Wt 93 lb 14.7 oz (42.6 kg)   SpO2 96%   BMI 18.34 kg/m   Trending CBC Recent Labs    09/01/22 0621 09/02/22 0626 09/02/22 1406 09/03/22 0342  WBC 13.3* 9.3  --  12.9*  HGB 12.9 12.4 11.9* 11.2*  HCT 39.0 38.9 35.0* 34.4*  PLT 415* 309  --  249    Trending Coag's Recent Labs    09/01/22 0621  INR 1.0    Trending BMET Recent Labs    09/01/22 0621 09/02/22 0626 09/02/22 1406 09/03/22 0342  NA 137 141 141 133*  K 3.1* 3.2* 4.4 3.6  CL 101 107  --  101  CO2 26 25  --  25  BUN 6 <5*  --  <5*  CREATININE 0.67 0.68  --  0.39*  GLUCOSE 111* 94  --  130*      Dorreen Valiente Dee  Trauma Response RN  Please call TRN at [phone] for further assistance.
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
## 2022-09-05 ENCOUNTER — Inpatient Hospital Stay (HOSPITAL_COMMUNITY): Payer: BC Managed Care – PPO

## 2022-09-05 LAB — BASIC METABOLIC PANEL
Anion gap: 4 — ABNORMAL LOW (ref 5–15)
BUN: 12 mg/dL (ref 6–20)
CO2: 26 mmol/L (ref 22–32)
Calcium: 8.4 mg/dL — ABNORMAL LOW (ref 8.9–10.3)
Chloride: 108 mmol/L (ref 98–111)
Creatinine, Ser: 0.5 mg/dL (ref 0.44–1.00)
GFR, Estimated: >60 mL/min (ref >60)
Glucose, Bld: 212 mg/dL — ABNORMAL HIGH (ref 70–99)
Potassium: 4.8 mmol/L (ref 3.5–5.1)
Sodium: 138 mmol/L (ref 135–145)

## 2022-09-05 LAB — POCT I-STAT 7, (LYTES, BLD GAS, ICA,H+H)
Acid-Base Excess: 1 mmol/L (ref 0.0–2.0)
Bicarbonate: 26 mmol/L (ref 20.0–28.0)
Calcium, Ion: 1.29 mmol/L (ref 1.15–1.40)
HCT: 28 % — ABNORMAL LOW (ref 36.0–46.0)
Hemoglobin: 9.5 g/dL — ABNORMAL LOW (ref 12.0–15.0)
O2 Saturation: 98 %
Patient temperature: 36.5
Potassium: 4.9 mmol/L (ref 3.5–5.1)
Sodium: 142 mmol/L (ref 135–145)
TCO2: 27 mmol/L (ref 22–32)
pCO2 arterial: 40.5 mmHg (ref 32–48)
pH, Arterial: 7.412 (ref 7.35–7.45)
pO2, Arterial: 106 mmHg (ref 83–108)

## 2022-09-05 LAB — GLUCOSE, CAPILLARY
Glucose-Capillary: 192 mg/dL — ABNORMAL HIGH (ref 70–99)
Glucose-Capillary: 185 mg/dL — ABNORMAL HIGH (ref 70–99)
Glucose-Capillary: 133 mg/dL — ABNORMAL HIGH (ref 70–99)
Glucose-Capillary: 159 mg/dL — ABNORMAL HIGH (ref 70–99)
Glucose-Capillary: 136 mg/dL — ABNORMAL HIGH (ref 70–99)
Glucose-Capillary: 128 mg/dL — ABNORMAL HIGH (ref 70–99)

## 2022-09-05 LAB — CBC
HCT: 29.5 % — ABNORMAL LOW (ref 36.0–46.0)
Hemoglobin: 9.6 g/dL — ABNORMAL LOW (ref 12.0–15.0)
MCH: 30.4 pg (ref 26.0–34.0)
MCHC: 32.5 g/dL (ref 30.0–36.0)
MCV: 93.4 fL (ref 80.0–100.0)
Platelets: 188 10*3/uL (ref 150–400)
RBC: 3.16 MIL/uL — ABNORMAL LOW (ref 3.87–5.11)
RDW: 13.6 % (ref 11.5–15.5)
WBC: 12.2 10*3/uL — ABNORMAL HIGH (ref 4.0–10.5)
nRBC: 0 % (ref 0.0–0.2)

## 2022-09-05 LAB — TRIGLYCERIDES: Triglycerides: 79 mg/dL (ref <150)

## 2022-09-05 MED ORDER — ADULT MULTIVITAMIN W/MINERALS CH
1.0000 | ORAL_TABLET | Freq: Every day | ORAL | Status: DC
  Administered 2022-09-05 – 2022-09-07 (×3): 1
  Filled 2022-09-05 (×3): qty 1

## 2022-09-05 MED ORDER — FOLIC ACID 1 MG PO TABS: 1.0000 mg | ORAL_TABLET | Freq: Every day | ORAL | Status: DC

## 2022-09-05 MED ORDER — HYDROXYZINE HCL 25 MG PO TABS: 50.0000 mg | ORAL_TABLET | Freq: Every day | ORAL | Status: DC

## 2022-09-05 MED ORDER — POLYETHYLENE GLYCOL 3350 17 G PO PACK: 17.0000 g | PACK | Freq: Every day | ORAL | Status: DC | PRN

## 2022-09-05 MED ORDER — ALBUTEROL SULFATE (2.5 MG/3ML) 0.083% IN NEBU
3.0000 mL | INHALATION_SOLUTION | Freq: Four times a day (QID) | RESPIRATORY_TRACT | Status: DC | PRN
  Administered 2022-09-11: 3 mL via RESPIRATORY_TRACT
  Filled 2022-09-05: qty 3

## 2022-09-05 MED ORDER — FOLIC ACID 1 MG PO TABS
1.0000 mg | ORAL_TABLET | Freq: Every day | ORAL | Status: DC
  Administered 2022-09-05 – 2022-09-07 (×3): 1 mg
  Filled 2022-09-05 (×3): qty 1

## 2022-09-05 MED ORDER — DOCUSATE SODIUM 100 MG PO CAPS: 100.0000 mg | ORAL_CAPSULE | Freq: Two times a day (BID) | ORAL | Status: DC

## 2022-09-05 MED ORDER — THIAMINE MONONITRATE 100 MG PO TABS: 100.0000 mg | ORAL_TABLET | Freq: Every day | ORAL | Status: DC

## 2022-09-05 MED ORDER — OXYCODONE HCL 5 MG PO TABS
5.0000 mg | ORAL_TABLET | ORAL | Status: DC | PRN
  Administered 2022-09-05 – 2022-09-06 (×2): 10 mg via ORAL
  Administered 2022-09-06: 5 mg via ORAL
  Administered 2022-09-06 – 2022-09-12 (×20): 10 mg via ORAL
  Filled 2022-09-05 (×20): qty 2
  Filled 2022-09-05: qty 1
  Filled 2022-09-05 (×2): qty 2

## 2022-09-05 MED ORDER — ADULT MULTIVITAMIN W/MINERALS CH: 1.0000 | ORAL_TABLET | Freq: Every day | ORAL | Status: DC

## 2022-09-05 MED ORDER — QUETIAPINE FUMARATE 100 MG PO TABS: 400.0000 mg | ORAL_TABLET | Freq: Two times a day (BID) | ORAL | Status: DC

## 2022-09-05 MED ORDER — GABAPENTIN 250 MG/5ML PO SOLN
800.0000 mg | Freq: Three times a day (TID) | ORAL | Status: DC
  Administered 2022-09-05 – 2022-09-12 (×20): 800 mg
  Filled 2022-09-05: qty 16
  Filled 2022-09-05: qty 18
  Filled 2022-09-05 (×2): qty 16
  Filled 2022-09-05 (×13): qty 18
  Filled 2022-09-05: qty 16
  Filled 2022-09-05 (×5): qty 18
  Filled 2022-09-05: qty 16
  Filled 2022-09-05: qty 18

## 2022-09-05 MED ORDER — PANCRELIPASE (LIP-PROT-AMYL) 12000-38000 UNITS PO CPEP
36000.0000 [IU] | ORAL_CAPSULE | Freq: Three times a day (TID) | ORAL | Status: DC
  Administered 2022-09-06 – 2022-09-13 (×25): 36000 [IU] via ORAL
  Filled 2022-09-05 (×2): qty 3
  Filled 2022-09-05: qty 1
  Filled 2022-09-05 (×6): qty 3
  Filled 2022-09-05: qty 1
  Filled 2022-09-05 (×2): qty 3
  Filled 2022-09-05 (×2): qty 1
  Filled 2022-09-05 (×2): qty 3
  Filled 2022-09-05: qty 1
  Filled 2022-09-05 (×2): qty 3
  Filled 2022-09-05: qty 1
  Filled 2022-09-05 (×3): qty 3
  Filled 2022-09-05: qty 1
  Filled 2022-09-05 (×8): qty 3

## 2022-09-05 MED ORDER — METHOCARBAMOL 500 MG PO TABS: 1000.0000 mg | ORAL_TABLET | Freq: Three times a day (TID) | ORAL | Status: DC

## 2022-09-05 MED ORDER — OXYBUTYNIN CHLORIDE ER 5 MG PO TB24
15.0000 mg | ORAL_TABLET | Freq: Every day | ORAL | Status: DC
  Administered 2022-09-07 – 2022-09-13 (×7): 15 mg via ORAL
  Filled 2022-09-05 (×8): qty 1

## 2022-09-05 MED ORDER — ACETAMINOPHEN 500 MG PO TABS: 1000.0000 mg | ORAL_TABLET | Freq: Four times a day (QID) | ORAL | Status: DC

## 2022-09-05 MED ORDER — GABAPENTIN 250 MG/5ML PO SOLN
400.0000 mg | Freq: Four times a day (QID) | ORAL | Status: DC
  Administered 2022-09-05: 400 mg
  Filled 2022-09-05 (×2): qty 8

## 2022-09-05 MED ORDER — POLYETHYLENE GLYCOL 3350 17 G PO PACK: 17.0000 g | PACK | Freq: Every day | ORAL | Status: DC

## 2022-09-05 MED ORDER — METOPROLOL TARTRATE 12.5 MG HALF TABLET
12.5000 mg | ORAL_TABLET | Freq: Two times a day (BID) | ORAL | Status: DC
  Administered 2022-09-05 – 2022-09-07 (×5): 12.5 mg
  Filled 2022-09-05 (×5): qty 1

## 2022-09-05 MED ORDER — ESTRADIOL 0.5 MG PO TABS: 2.0000 mg | ORAL_TABLET | Freq: Every day | ORAL | Status: DC

## 2022-09-05 MED ORDER — OLANZAPINE 5 MG PO TABS
5.0000 mg | ORAL_TABLET | Freq: Every day | ORAL | Status: DC
  Filled 2022-09-05: qty 1

## 2022-09-05 MED ORDER — CYCLOBENZAPRINE HCL 5 MG PO TABS: 5.0000 mg | ORAL_TABLET | Freq: Three times a day (TID) | ORAL | Status: DC | PRN

## 2022-09-05 MED ORDER — METOPROLOL TARTRATE 12.5 MG HALF TABLET: 12.5000 mg | ORAL_TABLET | Freq: Two times a day (BID) | ORAL | Status: DC

## 2022-09-05 MED ORDER — CYCLOBENZAPRINE HCL 10 MG PO TABS
5.0000 mg | ORAL_TABLET | Freq: Three times a day (TID) | ORAL | Status: DC | PRN
  Administered 2022-09-12 – 2022-09-13 (×2): 5 mg
  Filled 2022-09-05 (×4): qty 1

## 2022-09-05 MED ORDER — ESTRADIOL 0.5 MG PO TABS
2.0000 mg | ORAL_TABLET | Freq: Every day | ORAL | Status: DC
  Administered 2022-09-06: 2 mg
  Filled 2022-09-05 (×2): qty 4

## 2022-09-05 MED ORDER — MIRTAZAPINE 15 MG PO TABS
15.0000 mg | ORAL_TABLET | Freq: Every day | ORAL | Status: DC
  Administered 2022-09-05 – 2022-09-06 (×2): 15 mg
  Filled 2022-09-05 (×3): qty 1

## 2022-09-05 MED ORDER — FLUTICASONE PROPIONATE 50 MCG/ACT NA SUSP: 2.0000 | Freq: Every day | NASAL | Status: DC | PRN

## 2022-09-05 MED ORDER — QUETIAPINE FUMARATE 100 MG PO TABS
400.0000 mg | ORAL_TABLET | Freq: Two times a day (BID) | ORAL | Status: DC
  Administered 2022-09-05 – 2022-09-06 (×4): 400 mg
  Filled 2022-09-05: qty 4
  Filled 2022-09-05: qty 8
  Filled 2022-09-05 (×2): qty 4

## 2022-09-05 MED ORDER — HYDROXYZINE HCL 25 MG PO TABS
50.0000 mg | ORAL_TABLET | Freq: Every day | ORAL | Status: DC
  Administered 2022-09-05 – 2022-09-06 (×2): 50 mg
  Filled 2022-09-05 (×3): qty 2

## 2022-09-05 MED ORDER — OXYCODONE HCL 5 MG PO TABS: 5.0000 mg | ORAL_TABLET | ORAL | Status: DC | PRN

## 2022-09-05 MED ORDER — DOCUSATE SODIUM 50 MG/5ML PO LIQD
100.0000 mg | Freq: Two times a day (BID) | ORAL | Status: DC
  Administered 2022-09-06: 100 mg
  Filled 2022-09-05: qty 10

## 2022-09-05 MED ORDER — SODIUM CHLORIDE 0.9 % IV SOLN
2.0000 g | INTRAVENOUS | Status: AC
  Administered 2022-09-05 – 2022-09-09 (×5): 2 g via INTRAVENOUS
  Filled 2022-09-05 (×5): qty 20

## 2022-09-05 MED ORDER — ORAL CARE MOUTH RINSE: 15.0000 mL | OROMUCOSAL | Status: DC | PRN

## 2022-09-05 MED ORDER — PANTOPRAZOLE SODIUM 40 MG IV SOLR
40.0000 mg | INTRAVENOUS | Status: DC
  Administered 2022-09-05 – 2022-09-06 (×2): 40 mg via INTRAVENOUS
  Filled 2022-09-05 (×2): qty 10

## 2022-09-05 MED ORDER — HYDROMORPHONE HCL 1 MG/ML IJ SOLN
1.0000 mg | INTRAMUSCULAR | Status: DC | PRN
  Administered 2022-09-05 – 2022-09-12 (×9): 1 mg via INTRAVENOUS
  Filled 2022-09-05 (×9): qty 1

## 2022-09-05 MED ORDER — METHOCARBAMOL 500 MG PO TABS
1000.0000 mg | ORAL_TABLET | Freq: Three times a day (TID) | ORAL | Status: DC
  Filled 2022-09-05: qty 2

## 2022-09-05 MED ORDER — ATOMOXETINE HCL 10 MG PO CAPS
10.0000 mg | ORAL_CAPSULE | Freq: Every day | ORAL | Status: DC
  Administered 2022-09-07 – 2022-09-13 (×7): 10 mg via ORAL
  Filled 2022-09-05 (×10): qty 1

## 2022-09-05 MED ORDER — CYCLOSPORINE 0.05 % OP EMUL
1.0000 [drp] | Freq: Two times a day (BID) | OPHTHALMIC | Status: DC
  Administered 2022-09-05 – 2022-09-13 (×16): 1 [drp] via OPHTHALMIC
  Filled 2022-09-05 (×18): qty 30

## 2022-09-05 MED ORDER — ACETAMINOPHEN 500 MG PO TABS
1000.0000 mg | ORAL_TABLET | Freq: Four times a day (QID) | ORAL | Status: DC
  Administered 2022-09-05 – 2022-09-07 (×8): 1000 mg
  Filled 2022-09-05 (×8): qty 2

## 2022-09-05 MED ORDER — GABAPENTIN 250 MG/5ML PO SOLN
400.0000 mg | Freq: Four times a day (QID) | ORAL | Status: DC
  Filled 2022-09-05: qty 8

## 2022-09-05 MED ORDER — QUETIAPINE FUMARATE 100 MG PO TABS: 400.0000 mg | ORAL_TABLET | Freq: Every day | ORAL | Status: DC

## 2022-09-05 MED ORDER — MIRTAZAPINE 15 MG PO TABS: 15.0000 mg | ORAL_TABLET | Freq: Every day | ORAL | Status: DC

## 2022-09-05 MED ORDER — GABAPENTIN 400 MG PO CAPS
800.0000 mg | ORAL_CAPSULE | Freq: Three times a day (TID) | ORAL | Status: DC
  Filled 2022-09-05 (×2): qty 2
  Filled 2022-09-05: qty 8

## 2022-09-05 MED ORDER — OXYCODONE HCL 5 MG PO TABS
5.0000 mg | ORAL_TABLET | ORAL | Status: DC | PRN
  Administered 2022-09-05: 5 mg
  Filled 2022-09-05: qty 1

## 2022-09-05 MED ORDER — GABAPENTIN 250 MG/5ML PO SOLN
800.0000 mg | Freq: Three times a day (TID) | ORAL | Status: DC
  Administered 2022-09-05: 800 mg via ORAL
  Filled 2022-09-05 (×2): qty 16

## 2022-09-05 MED ORDER — PANTOPRAZOLE SODIUM 40 MG PO TBEC: 40.0000 mg | DELAYED_RELEASE_TABLET | Freq: Every day | ORAL | Status: DC

## 2022-09-05 MED ORDER — THIAMINE MONONITRATE 100 MG PO TABS
100.0000 mg | ORAL_TABLET | Freq: Every day | ORAL | Status: DC
  Administered 2022-09-05 – 2022-09-07 (×3): 100 mg
  Filled 2022-09-05 (×3): qty 1

## 2022-09-05 MED ORDER — OLANZAPINE 5 MG PO TABS
5.0000 mg | ORAL_TABLET | Freq: Every day | ORAL | Status: DC
  Administered 2022-09-05 – 2022-09-06 (×2): 5 mg
  Filled 2022-09-05 (×2): qty 1

## 2022-09-05 NOTE — Progress Notes (Signed)
Inpatient Rehab Admissions Coordinator Note:   Per therapy recommendations patient was screened for CIR candidacy by Stephania Fragmin, PT. At this time, pt appears to be a potential candidate for CIR. I will place an order for rehab consult for full assessment, per our protocol.  Please contact me any with questions.Estill Dooms, PT, DPT [phone] 09/05/22 1:33 PM
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
## 2022-09-05 NOTE — Procedures (Signed)
Extubation Procedure Note  Patient Details:   Name: [name] DOB: [birth year] MRN: [id number]   Airway Documentation:    Vent end date: 09/05/22 Vent end time: 0818   Evaluation  O2 sats: stable throughout Complications: No apparent complications Patient did tolerate procedure well. Bilateral Breath Sounds: Clear, Diminished   Yes  Pt extubated to 2L [place]. Cuff leak positive, no stridor noted,pt tolerated well, RN at bedside, RT will monitor as needed.   Thornell Mule 09/05/2022, 8:28 AM
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
## 2022-09-06 ENCOUNTER — Inpatient Hospital Stay (HOSPITAL_COMMUNITY): Payer: BC Managed Care – PPO

## 2022-09-06 DIAGNOSIS — J9601 Acute respiratory failure with hypoxia: Secondary | ICD-10-CM | POA: Diagnosis not present

## 2022-09-06 DIAGNOSIS — F1910 Other psychoactive substance abuse, uncomplicated: Secondary | ICD-10-CM

## 2022-09-06 DIAGNOSIS — M7989 Other specified soft tissue disorders: Secondary | ICD-10-CM

## 2022-09-06 DIAGNOSIS — R29898 Other symptoms and signs involving the musculoskeletal system: Secondary | ICD-10-CM | POA: Diagnosis not present

## 2022-09-06 DIAGNOSIS — F319 Bipolar disorder, unspecified: Secondary | ICD-10-CM

## 2022-09-06 DIAGNOSIS — K852 Alcohol induced acute pancreatitis without necrosis or infection: Secondary | ICD-10-CM | POA: Diagnosis not present

## 2022-09-06 DIAGNOSIS — R1310 Dysphagia, unspecified: Secondary | ICD-10-CM

## 2022-09-06 DIAGNOSIS — I10 Essential (primary) hypertension: Secondary | ICD-10-CM | POA: Diagnosis not present

## 2022-09-06 DIAGNOSIS — J939 Pneumothorax, unspecified: Secondary | ICD-10-CM

## 2022-09-06 LAB — BASIC METABOLIC PANEL
Anion gap: 4 — ABNORMAL LOW (ref 5–15)
BUN: 19 mg/dL (ref 6–20)
CO2: 27 mmol/L (ref 22–32)
Calcium: 8.5 mg/dL — ABNORMAL LOW (ref 8.9–10.3)
Chloride: 108 mmol/L (ref 98–111)
Creatinine, Ser: 0.51 mg/dL (ref 0.44–1.00)
GFR, Estimated: >60 mL/min (ref >60)
Glucose, Bld: 133 mg/dL — ABNORMAL HIGH (ref 70–99)
Potassium: 3.6 mmol/L (ref 3.5–5.1)
Sodium: 139 mmol/L (ref 135–145)

## 2022-09-06 LAB — CBC
HCT: 28.7 % — ABNORMAL LOW (ref 36.0–46.0)
Hemoglobin: 9.3 g/dL — ABNORMAL LOW (ref 12.0–15.0)
MCH: 30.3 pg (ref 26.0–34.0)
MCHC: 32.4 g/dL (ref 30.0–36.0)
MCV: 93.5 fL (ref 80.0–100.0)
Platelets: 210 10*3/uL (ref 150–400)
RBC: 3.07 MIL/uL — ABNORMAL LOW (ref 3.87–5.11)
RDW: 13.9 % (ref 11.5–15.5)
WBC: 8.2 10*3/uL (ref 4.0–10.5)
nRBC: 0 % (ref 0.0–0.2)

## 2022-09-06 LAB — CULTURE, RESPIRATORY W GRAM STAIN

## 2022-09-06 LAB — GLUCOSE, CAPILLARY
Glucose-Capillary: 104 mg/dL — ABNORMAL HIGH (ref 70–99)
Glucose-Capillary: 113 mg/dL — ABNORMAL HIGH (ref 70–99)
Glucose-Capillary: 90 mg/dL (ref 70–99)
Glucose-Capillary: 127 mg/dL — ABNORMAL HIGH (ref 70–99)
Glucose-Capillary: 100 mg/dL — ABNORMAL HIGH (ref 70–99)
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
## 2022-09-06 NOTE — Progress Notes (Signed)
  Progress Note   Date: 09/05/2022  Patient Name: [name]        MRN#: [id number]  Magnesium was given for hypomagnesemia
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
## 2022-09-06 NOTE — Progress Notes (Signed)
PT Cancellation Note  Patient Details Name: [name] MRN: [id number] DOB: 1975-09-19   Cancelled Treatment:    Reason Eval/Treat Not Completed: Fatigue/lethargy limiting ability to participate. Pt very lethargic. RN reports pt did not sleep well last night. She also received dilaudid this AM before chest tube was pulled. Plan is for [facility] and transfer to [facility] this afternoon. PT to re-attempt as time allows.   Ilda Foil 09/06/2022, 11:39 AM

## 2022-09-06 NOTE — Progress Notes (Signed)
Right upper extremity venous duplex has been completed. Preliminary results can be found in CV Proc through chart review.   09/06/22 1:10 PM Olen Cordial RVT

## 2022-09-06 NOTE — Plan of Care (Signed)
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
## 2022-09-06 NOTE — Progress Notes (Signed)
  Progress Note   Date: 09/05/2022  Patient Name: [name]        MRN#: [id number]  Clarification of diagnosis:  severe malnutrition

## 2022-09-07 ENCOUNTER — Inpatient Hospital Stay (HOSPITAL_COMMUNITY): Payer: BC Managed Care – PPO

## 2022-09-07 LAB — GLUCOSE, CAPILLARY
Glucose-Capillary: 112 mg/dL — ABNORMAL HIGH (ref 70–99)
Glucose-Capillary: 119 mg/dL — ABNORMAL HIGH (ref 70–99)
Glucose-Capillary: 137 mg/dL — ABNORMAL HIGH (ref 70–99)
Glucose-Capillary: 96 mg/dL (ref 70–99)
Glucose-Capillary: 103 mg/dL — ABNORMAL HIGH (ref 70–99)
Glucose-Capillary: 110 mg/dL — ABNORMAL HIGH (ref 70–99)
Glucose-Capillary: 124 mg/dL — ABNORMAL HIGH (ref 70–99)

## 2022-09-07 LAB — BASIC METABOLIC PANEL
Anion gap: 12 (ref 5–15)
BUN: 18 mg/dL (ref 6–20)
CO2: 25 mmol/L (ref 22–32)
Calcium: 8.8 mg/dL — ABNORMAL LOW (ref 8.9–10.3)
Chloride: 99 mmol/L (ref 98–111)
Creatinine, Ser: 0.54 mg/dL (ref 0.44–1.00)
GFR, Estimated: >60 mL/min (ref >60)
Glucose, Bld: 108 mg/dL — ABNORMAL HIGH (ref 70–99)
Potassium: 3.8 mmol/L (ref 3.5–5.1)
Sodium: 136 mmol/L (ref 135–145)

## 2022-09-07 LAB — CBC
HCT: 36.5 % (ref 36.0–46.0)
Hemoglobin: 11.7 g/dL — ABNORMAL LOW (ref 12.0–15.0)
MCH: 29.1 pg (ref 26.0–34.0)
MCHC: 32.1 g/dL (ref 30.0–36.0)
MCV: 90.8 fL (ref 80.0–100.0)
Platelets: 291 10*3/uL (ref 150–400)
RBC: 4.02 MIL/uL (ref 3.87–5.11)
RDW: 13.7 % (ref 11.5–15.5)
WBC: 9.2 10*3/uL (ref 4.0–10.5)
nRBC: 0 % (ref 0.0–0.2)

## 2022-09-07 MED ORDER — HYDROXYZINE HCL 25 MG PO TABS
50.0000 mg | ORAL_TABLET | Freq: Every day | ORAL | Status: DC
  Administered 2022-09-07 – 2022-09-11 (×5): 50 mg via ORAL
  Filled 2022-09-07 (×6): qty 2

## 2022-09-07 MED ORDER — DOCUSATE SODIUM 50 MG/5ML PO LIQD
100.0000 mg | Freq: Two times a day (BID) | ORAL | Status: DC
  Administered 2022-09-08 – 2022-09-13 (×3): 100 mg via ORAL
  Filled 2022-09-07 (×8): qty 10

## 2022-09-07 MED ORDER — POLYETHYLENE GLYCOL 3350 17 G PO PACK
17.0000 g | PACK | Freq: Every day | ORAL | Status: DC
  Administered 2022-09-08: 17 g via ORAL
  Filled 2022-09-07 (×3): qty 1

## 2022-09-07 MED ORDER — METOPROLOL TARTRATE 5 MG/5ML IV SOLN
5.0000 mg | INTRAVENOUS | Status: AC
  Administered 2022-09-07: 5 mg via INTRAVENOUS

## 2022-09-07 MED ORDER — ACETAMINOPHEN 500 MG PO TABS
1000.0000 mg | ORAL_TABLET | Freq: Three times a day (TID) | ORAL | Status: DC
  Administered 2022-09-07 – 2022-09-13 (×17): 1000 mg via ORAL
  Filled 2022-09-07 (×17): qty 2

## 2022-09-07 MED ORDER — METOPROLOL TARTRATE 12.5 MG HALF TABLET
12.5000 mg | ORAL_TABLET | Freq: Two times a day (BID) | ORAL | Status: DC
  Administered 2022-09-07 – 2022-09-13 (×11): 12.5 mg via ORAL
  Filled 2022-09-07 (×12): qty 1

## 2022-09-07 MED ORDER — OLANZAPINE 5 MG PO TABS
5.0000 mg | ORAL_TABLET | Freq: Every day | ORAL | Status: DC
  Administered 2022-09-07 – 2022-09-13 (×7): 5 mg via ORAL
  Filled 2022-09-07 (×7): qty 1

## 2022-09-07 MED ORDER — ENSURE ENLIVE PO LIQD
237.0000 mL | Freq: Two times a day (BID) | ORAL | Status: DC
  Administered 2022-09-07 – 2022-09-08 (×3): 237 mL via ORAL

## 2022-09-07 MED ORDER — ESTRADIOL 0.5 MG PO TABS
2.0000 mg | ORAL_TABLET | Freq: Every day | ORAL | Status: DC
  Administered 2022-09-07 – 2022-09-13 (×7): 2 mg via ORAL
  Filled 2022-09-07 (×8): qty 4

## 2022-09-07 MED ORDER — MIRTAZAPINE 15 MG PO TABS
15.0000 mg | ORAL_TABLET | Freq: Every day | ORAL | Status: DC
  Administered 2022-09-07 – 2022-09-11 (×5): 15 mg via ORAL
  Filled 2022-09-07 (×5): qty 1

## 2022-09-07 MED ORDER — CLONAZEPAM 1 MG PO TABS
1.0000 mg | ORAL_TABLET | Freq: Three times a day (TID) | ORAL | Status: DC
  Administered 2022-09-07 – 2022-09-12 (×16): 1 mg via ORAL
  Filled 2022-09-07 (×16): qty 1

## 2022-09-07 MED ORDER — PANTOPRAZOLE SODIUM 40 MG PO TBEC
40.0000 mg | DELAYED_RELEASE_TABLET | Freq: Every day | ORAL | Status: DC
  Administered 2022-09-07 – 2022-09-13 (×7): 40 mg via ORAL
  Filled 2022-09-07 (×7): qty 1

## 2022-09-07 MED ORDER — OSMOLITE 1.2 CAL PO LIQD
1000.0000 mL | ORAL | Status: DC
  Administered 2022-09-07 – 2022-09-11 (×5): 1000 mL
  Filled 2022-09-07: qty 1000

## 2022-09-07 MED ORDER — FOLIC ACID 1 MG PO TABS
1.0000 mg | ORAL_TABLET | Freq: Every day | ORAL | Status: DC
  Administered 2022-09-08 – 2022-09-13 (×6): 1 mg via ORAL
  Filled 2022-09-07 (×6): qty 1

## 2022-09-07 MED ORDER — THIAMINE MONONITRATE 100 MG PO TABS
100.0000 mg | ORAL_TABLET | Freq: Every day | ORAL | Status: DC
  Administered 2022-09-08 – 2022-09-13 (×6): 100 mg via ORAL
  Filled 2022-09-07 (×6): qty 1

## 2022-09-07 MED ORDER — ADULT MULTIVITAMIN W/MINERALS CH
1.0000 | ORAL_TABLET | Freq: Every day | ORAL | Status: DC
  Administered 2022-09-08 – 2022-09-13 (×6): 1 via ORAL
  Filled 2022-09-07 (×6): qty 1

## 2022-09-07 MED ORDER — QUETIAPINE FUMARATE 100 MG PO TABS
400.0000 mg | ORAL_TABLET | Freq: Two times a day (BID) | ORAL | Status: DC
  Administered 2022-09-07 – 2022-09-12 (×11): 400 mg via ORAL
  Filled 2022-09-07 (×8): qty 4
  Filled 2022-09-07: qty 8
  Filled 2022-09-07 (×3): qty 4
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
## 2022-09-07 NOTE — [place] FL2 (Signed)
[place] MEDICAID FL2 LEVEL OF CARE FORM     IDENTIFICATION  Patient Name: [name] Birthdate: 1975/10/01 Sex: female Admission Date (Current Location): 09/01/2022  [place] and [place] Number:  Haynes Bast [id number] Facility and Address:  [facility], [address], [place], [place] [address]      Provider Number: [id number]  Attending Physician Name and Address:  Violeta Gelinas, MD  Relative Name and Phone Number:  Margarita Mail, daughter: [phone]    Current Level of Care: Hospital Recommended Level of Care: Skilled Nursing Facility Prior Approval Number:    Date Approved/Denied:   PASRR Number: Pending  Discharge Plan: SNF    Current Diagnoses: Patient Active Problem List   Diagnosis Date Noted   Pneumothorax on left 09/01/2022   Pancreatic insufficiency 04/26/2021   Dysphagia    UTI (urinary tract infection) 01/21/2021   Thrombocytosis 01/21/2021   Elevated lipase 01/21/2021   Hypokalemia 01/21/2021   Fibromyalgia 01/21/2021   History of pancreatitis 01/21/2021   Nausea without vomiting 05/24/2020   Abdominal pain 05/24/2020   Severe protein-calorie malnutrition (HCC) 05/24/2020   Encounter by telehealth for suspected COVID-19 03/20/2019   Abdominal pain, epigastric 10/07/2018   Intractable vomiting 08/09/2018   Compression fracture of fifth lumbar vertebra (HCC) 02/07/2018   Altered mental status 02/07/2018   Generalized weakness 01/24/2018   MDD (major depressive disorder) 04/23/2017   Substance abuse (HCC) 04/19/2017   Hyperthyroidism 02/17/2015   Anxiety 01/28/2015   Clinical depression 01/28/2015   COPD with acute exacerbation (HCC) 11/26/2013   Vaginitis and vulvovaginitis 11/26/2013   Recurrent pneumonia 02/02/2013   Left arm pain 01/13/2013   Left arm pain 01/13/2013   Dizziness and giddiness 01/13/2013   Acute bronchitis 12/07/2012   Rib pain 12/07/2012   Insomnia 08/26/2012   Diarrhea 06/19/2012   Heme  positive stool 06/19/2012   Bloating 06/19/2012   Nausea alone 06/19/2012   Bladder retention 04/24/2012   Tremor 03/05/2012   Dysuria 03/05/2012   Failure to thrive in adult 06/21/2011   Airway hyperreactivity 03/08/2011   Back pain, chronic 03/08/2011   Chronic obstructive pulmonary disease (HCC) 03/08/2011   Acid reflux 03/08/2011   Cystitis 01/22/2011   Bilateral hand numbness 01/13/2011   WEIGHT LOSS 06/24/2010   RIB PAIN, LEFT SIDED 05/04/2010   ABDOMINAL PAIN, LEFT UPPER QUADRANT 05/04/2010   Unspecified otitis media 10/26/2009   BRONCHITIS, RECURRENT 08/23/2009   URI, ACUTE 08/04/2009   FATIGUE 07/06/2009   Irritable bowel syndrome with both constipation and diarrhea 03/11/2009   ANAL FISSURE 03/11/2009   RECTAL BLEEDING 03/11/2009   Chronic interstitial cystitis 03/11/2009   COLONIC POLYPS, HX OF 03/11/2009   GERD 02/05/2009   SMOKER 12/04/2008   chronic asthma poorly controlled 12/04/2008   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   BENZODIAZEPINE ADDICTION 08/31/2007   Bipolar disorder (HCC) 08/31/2007   HYPERTENSION 08/31/2007   EMPHYSEMA 08/31/2007   NEPHROLITHIASIS 08/31/2007   ARTHRITIS 08/31/2007   FIBROMYALGIA 08/31/2007   SLEEP APNEA 08/31/2007    Orientation RESPIRATION BLADDER Height & Weight     Self, Time, Situation, Place  Normal External catheter Weight: 43.6 kg Height:  5' (152.4 cm)  BEHAVIORAL SYMPTOMS/MOOD NEUROLOGICAL BOWEL NUTRITION STATUS      Incontinent Diet  AMBULATORY STATUS COMMUNICATION OF NEEDS Skin   Extensive Assist Verbally Skin abrasions (blisters, abrasions-bilateral arms, chest.  Foam dressings applied.)  Personal Care Assistance Level of Assistance  Bathing, Feeding, Dressing Bathing Assistance: Maximum assistance Feeding assistance: Limited assistance Dressing Assistance: Maximum assistance     Functional Limitations Info             SPECIAL CARE FACTORS FREQUENCY  PT (By licensed PT),  OT (By licensed OT)     PT Frequency: 5x weekly OT Frequency: 5x weekly            Contractures Contractures Info: Not present    Additional Factors Info  Code Status, Allergies, Psychotropic Code Status Info: Full code Allergies Info: Amitriptyline-anaphylaxis; Metoclopramide HCL-seizures; Abilify- Throat swelling, tremors; Propoxyphene-rash; Toradol-hypersensitivity; Tramadol-swelling, rash; Ambien-hallucinations; Eszopiclone-hallucinations, hyperactivity, bad taste in mouth, Fentanyl-Itching, tremors, tachycardia; Varenicline-suicidal thoughts; Buprenorphine-itching, hives; Demerol-rash; Emetrol-hives, itching, rash; morphine/codeine-hives, itching Psychotropic Info: Straterra, Klonopin, Zyprexa, Seroquel         Current Medications (09/07/2022):  This is the current hospital active medication list Current Facility-Administered Medications  Medication Dose Route Frequency Provider Last Rate Last Admin   0.9 %  sodium chloride infusion   Intravenous PRN Violeta Gelinas, MD   Stopped at 09/01/22 1449   0.9 %  sodium chloride infusion   Intra-arterial PRN Kinsinger, De Blanch, MD       acetaminophen (TYLENOL) tablet 1,000 mg  1,000 mg Oral Q8H Violeta Gelinas, MD   1,000 mg at 09/07/22 1519   albuterol (PROVENTIL) (2.5 MG/3ML) 0.083% nebulizer solution 3 mL  3 mL Inhalation Q6H PRN Juliet Rude, PA-C       atomoxetine (STRATTERA) capsule 10 mg  10 mg Oral Daily Juliet Rude, PA-C   10 mg at 09/07/22 1016   cefTRIAXone (ROCEPHIN) 2 g in sodium chloride 0.9 % 100 mL IVPB  2 g Intravenous Q24H Diamantina Monks, MD 200 mL/hr at 09/07/22 0842 2 g at 09/07/22 9604   Chlorhexidine Gluconate Cloth 2 % PADS 6 each  6 each Topical Daily Violeta Gelinas, MD   6 each at 09/07/22 1017   clonazePAM (KLONOPIN) tablet 1 mg  1 mg Oral TID Violeta Gelinas, MD   1 mg at 09/07/22 1106   cyclobenzaprine (FLEXERIL) tablet 5 mg  5 mg Per Tube TID PRN Violeta Gelinas, MD       cycloSPORINE  (RESTASIS) 0.05 % ophthalmic emulsion 1 drop  1 drop Both Eyes BID Trixie Deis R, PA-C   1 drop at 09/07/22 1016   docusate (COLACE) 50 MG/5ML liquid 100 mg  100 mg Oral BID Violeta Gelinas, MD       enoxaparin (LOVENOX) injection 30 mg  30 mg Subcutaneous Q12H Violeta Gelinas, MD   30 mg at 09/07/22 0813   estradiol (ESTRACE) tablet 2 mg  2 mg Oral Daily Violeta Gelinas, MD   2 mg at 09/07/22 1107   feeding supplement (ENSURE ENLIVE / ENSURE PLUS) liquid 237 mL  237 mL Oral BID BM Violeta Gelinas, MD   237 mL at 09/07/22 1519   feeding supplement (OSMOLITE 1.2 CAL) liquid 1,000 mL  1,000 mL Per Tube Q24H Violeta Gelinas, MD       feeding supplement (PROSource TF20) liquid 60 mL  60 mL Per Tube Daily Violeta Gelinas, MD   60 mL at 09/07/22 0813   fluticasone (FLONASE) 50 MCG/ACT nasal spray 2 spray  2 spray Each Nare Daily PRN Juliet Rude, PA-C       [START ON 09/08/2022] folic acid (FOLVITE) tablet 1 mg  1 mg Oral Daily Violeta Gelinas, MD  gabapentin (NEURONTIN) 250 MG/5ML solution 800 mg  800 mg Per Tube TID Violeta Gelinas, MD   800 mg at 09/07/22 1107   hydrALAZINE (APRESOLINE) injection 10 mg  10 mg Intravenous Q2H PRN Violeta Gelinas, MD       HYDROmorphone (DILAUDID) injection 1 mg  1 mg Intravenous Q3H PRN Diamantina Monks, MD   1 mg at 09/06/22 0836   hydrOXYzine (ATARAX) tablet 50 mg  50 mg Oral QHS Violeta Gelinas, MD       lipase/protease/amylase (CREON) capsule 36,000 Units  36,000 Units Oral TID WC & HS Violeta Gelinas, MD   36,000 Units at 09/07/22 1109   metoprolol tartrate (LOPRESSOR) injection 5 mg  5 mg Intravenous Q4H PRN Kinsinger, De Blanch, MD   5 mg at 09/07/22 1138   metoprolol tartrate (LOPRESSOR) tablet 12.5 mg  12.5 mg Oral BID Violeta Gelinas, MD       mirtazapine (REMERON) tablet 15 mg  15 mg Oral QHS Violeta Gelinas, MD       Melene Muller ON 09/08/2022] multivitamin with minerals tablet 1 tablet  1 tablet Oral Daily Violeta Gelinas, MD       OLANZapine  [facility]) tablet 5 mg  5 mg Oral Daily Violeta Gelinas, MD   5 mg at 09/07/22 1107   ondansetron (ZOFRAN-ODT) disintegrating tablet 4 mg  4 mg Oral Q6H PRN Quenton Fetter, RPH       Or   ondansetron (ZOFRAN) injection 4 mg  4 mg Intravenous Q6H PRN Quenton Fetter, RPH       Oral care mouth rinse  15 mL Mouth Rinse PRN Diamantina Monks, MD       oxybutynin (DITROPAN-XL) 24 hr tablet 15 mg  15 mg Oral Daily Violeta Gelinas, MD   15 mg at 09/07/22 1016   oxyCODONE (Oxy IR/ROXICODONE) immediate release tablet 5-10 mg  5-10 mg Oral Q4H PRN Juliet Rude, PA-C   10 mg at 09/07/22 1520   pantoprazole (PROTONIX) EC tablet 40 mg  40 mg Oral Daily Calton Dach I, RPH   40 mg at 09/07/22 1015   polyethylene glycol (MIRALAX / GLYCOLAX) packet 17 g  17 g Per Tube Daily PRN Link Snuffer B, RPH       polyethylene glycol (MIRALAX / GLYCOLAX) packet 17 g  17 g Oral Daily Violeta Gelinas, MD       QUEtiapine (SEROQUEL) tablet 400 mg  400 mg Oral BID Violeta Gelinas, MD   400 mg at 09/07/22 1106   sodium chloride flush (NS) 0.9 % injection 10-40 mL  10-40 mL Intracatheter Q12H Violeta Gelinas, MD   10 mL at 09/07/22 1108   sodium chloride flush (NS) 0.9 % injection 10-40 mL  10-40 mL Intracatheter PRN Violeta Gelinas, MD       Melene Muller ON 09/08/2022] thiamine (VITAMIN B1) tablet 100 mg  100 mg Oral Daily Violeta Gelinas, MD         Discharge Medications: Please see discharge summary for a list of discharge medications.  Relevant Imaging Results:  Relevant Lab Results:   Additional Information [id number]  Quintella Baton, RN, BSN  Trauma/Neuro ICU Case Manager [phone]

## 2022-09-07 NOTE — Progress Notes (Signed)
Patient ID: [name], female   DOB: 1975/10/10, 47 y.o.   MRN: [id number] I called her sister, Trula Ore, and updated her on [name]'s progress and the plan of care. She requested to be involved with the SNF placement process. I will let the [facility] team know.  Violeta Gelinas, MD, MPH, FACS Please use [email or web address] to contact on call provider

## 2022-09-07 NOTE — TOC Progression Note (Signed)
Transition of Care [facility]) - Progression Note    Patient Details  Name: [name] MRN: [id number] Date of Birth: [birth year]  Transition of Care [facility] CM/SW Contact  Glennon Mac, RN Phone Number: 09/07/2022, 11:55am Clinical Narrative:    Patient prefers SNF over CIR; met with patient, and she prefers [facility] facility, if possible.  Will initiate FL2 and fax out for SNF bed; will provide bed offers when available. Asked if I could contact family to assist with decision making, and patient states, "no, I make my own decisions."   Expected Discharge Plan: IP Rehab Facility Barriers to Discharge: Continued Medical Work up  Expected Discharge Plan and Services   Discharge Planning Services: CM Consult   Living arrangements for the past 2 months: Single Family Home                                       Social Determinants of Health (SDOH) Interventions SDOH Screenings   Alcohol Screen: Low Risk  (04/23/2017)  Depression (PHQ2-9): Medium Risk (02/02/2020)  Tobacco Use: High Risk (09/01/2022)    Readmission Risk Interventions    02/01/2021    8:38 AM  Readmission Risk Prevention Plan  Transportation Screening Complete  Medication Review (RN Care Manager) Complete  PCP or Specialist appointment within 3-5 days of discharge Complete  HRI or Home Care Consult Complete  SW Recovery Care/Counseling Consult Complete  Palliative Care Screening Not Applicable  Skilled Nursing Facility Not Applicable   Quintella Baton, RN, BSN  Trauma/Neuro ICU Case Manager [phone]
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
## 2022-09-07 NOTE — Progress Notes (Signed)
PT Cancellation Note  Patient Details Name: [name] MRN: [id number] DOB: [birth year]   Cancelled Treatment:    Reason Eval/Treat Not Completed: Other (comment)  Patient on phone with her daughter, cursing and crying. Will return 6/1 for additional vestibular assessment.    Jerolyn Center, PT Acute Rehabilitation Services  Office [phone]  [name] 09/07/2022, 3:04 PM

## 2022-09-07 NOTE — Plan of Care (Signed)

## 2022-09-08 LAB — GLUCOSE, CAPILLARY
Glucose-Capillary: 138 mg/dL — ABNORMAL HIGH (ref 70–99)
Glucose-Capillary: 128 mg/dL — ABNORMAL HIGH (ref 70–99)
Glucose-Capillary: 162 mg/dL — ABNORMAL HIGH (ref 70–99)
Glucose-Capillary: 117 mg/dL — ABNORMAL HIGH (ref 70–99)
Glucose-Capillary: 129 mg/dL — ABNORMAL HIGH (ref 70–99)
Glucose-Capillary: 185 mg/dL — ABNORMAL HIGH (ref 70–99)

## 2022-09-08 MED ORDER — IPRATROPIUM-ALBUTEROL 0.5-2.5 (3) MG/3ML IN SOLN
3.0000 mL | Freq: Once | RESPIRATORY_TRACT | Status: AC
  Administered 2022-09-08: 3 mL via RESPIRATORY_TRACT
  Filled 2022-09-08: qty 3

## 2022-09-08 MED ORDER — PROCHLORPERAZINE EDISYLATE 10 MG/2ML IJ SOLN
10.0000 mg | Freq: Four times a day (QID) | INTRAMUSCULAR | Status: DC | PRN
  Administered 2022-09-13: 10 mg via INTRAVENOUS
  Filled 2022-09-08: qty 2

## 2022-09-08 MED ORDER — MOMETASONE FURO-FORMOTEROL FUM 100-5 MCG/ACT IN AERO
2.0000 | INHALATION_SPRAY | Freq: Two times a day (BID) | RESPIRATORY_TRACT | Status: DC
  Administered 2022-09-08 – 2022-09-13 (×8): 2 via RESPIRATORY_TRACT
  Filled 2022-09-08: qty 8.8
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
## 2022-09-08 NOTE — TOC Progression Note (Signed)
Transition of Care [facility]) - Progression Note    Patient Details  Name: [name] MRN: [id number] Date of Birth: 09-04-1975  Transition of Care [facility]) CM/SW Contact  Glennon Mac, RN Phone Number: 09/08/2022, 2:03 PM  Clinical Narrative:    Patient was faxed out to [facility] facilities, per her request.  She has 1 bed offer, which she does not approve of.  She is agreeable to being faxed out to [facility] facilities to see if we can get some other offers.  Will fax out this afternoon in an attempt to receive other  bed offers.   Expected Discharge Plan: Skilled Nursing Facility Barriers to Discharge: Continued Medical Work up  Expected Discharge Plan and Services   Discharge Planning Services: CM Consult   Living arrangements for the past 2 months: Single Family Home                                       Social Determinants of Health (SDOH) Interventions SDOH Screenings   Alcohol Screen: Low Risk  (04/23/2017)  Depression (PHQ2-9): Medium Risk (02/02/2020)  Tobacco Use: High Risk (09/01/2022)    Readmission Risk Interventions    02/01/2021    8:38 AM  Readmission Risk Prevention Plan  Transportation Screening Complete  Medication Review (RN Care Manager) Complete  PCP or Specialist appointment within 3-5 days of discharge Complete  HRI or Home Care Consult Complete  SW Recovery Care/Counseling Consult Complete  Palliative Care Screening Not Applicable  Skilled Nursing Facility Not Applicable   Quintella Baton, RN, BSN  Trauma/Neuro ICU Case Manager [phone]

## 2022-09-08 NOTE — TOC CM/SW Note (Signed)
RE:  [name]   Date of Birth:  01/02/1976 _________ Date: 09/08/2022       To Whom It May Concern:  Please be advised that the above-named patient will require a short-term nursing home stay - anticipated 30 days or less for rehabilitation and strengthening.  The plan is for return home.         MD signature   ______________________________ Date
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
## 2022-09-09 LAB — CBC
HCT: 35 % — ABNORMAL LOW (ref 36.0–46.0)
Hemoglobin: 11.6 g/dL — ABNORMAL LOW (ref 12.0–15.0)
MCH: 30.5 pg (ref 26.0–34.0)
MCHC: 33.1 g/dL (ref 30.0–36.0)
MCV: 92.1 fL (ref 80.0–100.0)
Platelets: 390 10*3/uL (ref 150–400)
RBC: 3.8 MIL/uL — ABNORMAL LOW (ref 3.87–5.11)
RDW: 13.2 % (ref 11.5–15.5)
WBC: 9 10*3/uL (ref 4.0–10.5)
nRBC: 0 % (ref 0.0–0.2)

## 2022-09-09 LAB — BASIC METABOLIC PANEL
Anion gap: 9 (ref 5–15)
BUN: 24 mg/dL — ABNORMAL HIGH (ref 6–20)
CO2: 24 mmol/L (ref 22–32)
Calcium: 8.9 mg/dL (ref 8.9–10.3)
Chloride: 101 mmol/L (ref 98–111)
Creatinine, Ser: 0.57 mg/dL (ref 0.44–1.00)
GFR, Estimated: >60 mL/min (ref >60)
Glucose, Bld: 55 mg/dL — ABNORMAL LOW (ref 70–99)
Potassium: 3.7 mmol/L (ref 3.5–5.1)
Sodium: 134 mmol/L — ABNORMAL LOW (ref 135–145)

## 2022-09-09 LAB — GLUCOSE, CAPILLARY
Glucose-Capillary: 111 mg/dL — ABNORMAL HIGH (ref 70–99)
Glucose-Capillary: 112 mg/dL — ABNORMAL HIGH (ref 70–99)
Glucose-Capillary: 147 mg/dL — ABNORMAL HIGH (ref 70–99)
Glucose-Capillary: 147 mg/dL — ABNORMAL HIGH (ref 70–99)
Glucose-Capillary: 79 mg/dL (ref 70–99)
Glucose-Capillary: 95 mg/dL (ref 70–99)
Glucose-Capillary: 96 mg/dL (ref 70–99)

## 2022-09-09 LAB — MAGNESIUM: Magnesium: 2.1 mg/dL (ref 1.7–2.4)

## 2022-09-09 LAB — TROPONIN I (HIGH SENSITIVITY): Troponin I (High Sensitivity): 3 ng/L (ref <18)
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
## 2022-09-10 LAB — GLUCOSE, CAPILLARY
Glucose-Capillary: 111 mg/dL — ABNORMAL HIGH (ref 70–99)
Glucose-Capillary: 116 mg/dL — ABNORMAL HIGH (ref 70–99)
Glucose-Capillary: 120 mg/dL — ABNORMAL HIGH (ref 70–99)
Glucose-Capillary: 123 mg/dL — ABNORMAL HIGH (ref 70–99)
Glucose-Capillary: 136 mg/dL — ABNORMAL HIGH (ref 70–99)
Glucose-Capillary: 153 mg/dL — ABNORMAL HIGH (ref 70–99)

## 2022-09-10 MED ORDER — LORAZEPAM 2 MG/ML PO CONC
0.5000 mg | Freq: Once | ORAL | Status: AC | PRN
  Administered 2022-09-10: 0.5 mg via ORAL
  Filled 2022-09-10: qty 0.25

## 2022-09-10 MED ORDER — BACITRACIN ZINC 500 UNIT/GM EX OINT
TOPICAL_OINTMENT | Freq: Two times a day (BID) | CUTANEOUS | Status: DC
  Administered 2022-09-10 – 2022-09-12 (×5): 31.5 via TOPICAL
  Filled 2022-09-10 (×2): qty 28.4
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
## 2022-09-11 ENCOUNTER — Inpatient Hospital Stay (HOSPITAL_COMMUNITY): Payer: BC Managed Care – PPO

## 2022-09-11 LAB — GLUCOSE, CAPILLARY
Glucose-Capillary: 106 mg/dL — ABNORMAL HIGH (ref 70–99)
Glucose-Capillary: 101 mg/dL — ABNORMAL HIGH (ref 70–99)
Glucose-Capillary: 158 mg/dL — ABNORMAL HIGH (ref 70–99)
Glucose-Capillary: 155 mg/dL — ABNORMAL HIGH (ref 70–99)

## 2022-09-11 MED ORDER — LORAZEPAM 2 MG/ML PO CONC: 0.5000 mg | Freq: Once | ORAL | Status: DC | PRN

## 2022-09-11 MED ORDER — BUSPIRONE HCL 5 MG PO TABS
15.0000 mg | ORAL_TABLET | Freq: Three times a day (TID) | ORAL | Status: DC
  Administered 2022-09-11 – 2022-09-13 (×6): 15 mg via ORAL
  Filled 2022-09-11 (×7): qty 3

## 2022-09-11 MED ORDER — LORAZEPAM 2 MG/ML IJ SOLN
0.5000 mg | Freq: Once | INTRAMUSCULAR | Status: AC | PRN
  Administered 2022-09-11: 0.5 mg via INTRAVENOUS
  Filled 2022-09-11: qty 1

## 2022-09-11 MED ORDER — IPRATROPIUM-ALBUTEROL 0.5-2.5 (3) MG/3ML IN SOLN: 3.0000 mL | Freq: Once | RESPIRATORY_TRACT | Status: DC
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
## 2022-09-11 NOTE — Progress Notes (Signed)
Patient has been having a lot of anxiety starting yesterday (6/2) afternoon. It subsided a bit this morning and then ramped up again this afternoon.  Per Leary Roca PA, she was allowed to go outside after her workup was done. Patient went outside with [facility] NT and patient's daughter and daughter's friend. Patient then wanted to smoke cigarettes and became upset when she was not allowed to smoke cigarettes, especially because she saw someone else outside smoking a cigarette. I saw patient when she returned. She was agitated and tearful. Leary Roca notified and concluded that she should not be allowed to go outside again. Per patient she is allergic to the tape on nicotine patches & becomes suicidal when on chantix. She also said her doctor said she "should not quit cigarettes cold [place] and that her heart couldn't take it." Patient was given her PO anxiety medications and IV dilaudid. Patient requested something else for pain or anxiety. Casimiro Needle PA ordered a one time dose of 0.25 mg ativan.

## 2022-09-12 DIAGNOSIS — J939 Pneumothorax, unspecified: Secondary | ICD-10-CM | POA: Diagnosis not present

## 2022-09-12 DIAGNOSIS — F319 Bipolar disorder, unspecified: Secondary | ICD-10-CM | POA: Diagnosis not present

## 2022-09-12 LAB — GLUCOSE, CAPILLARY
Glucose-Capillary: 102 mg/dL — ABNORMAL HIGH (ref 70–99)
Glucose-Capillary: 108 mg/dL — ABNORMAL HIGH (ref 70–99)
Glucose-Capillary: 94 mg/dL (ref 70–99)
Glucose-Capillary: 104 mg/dL — ABNORMAL HIGH (ref 70–99)
Glucose-Capillary: 105 mg/dL — ABNORMAL HIGH (ref 70–99)
Glucose-Capillary: 85 mg/dL (ref 70–99)
Glucose-Capillary: 104 mg/dL — ABNORMAL HIGH (ref 70–99)

## 2022-09-12 LAB — EXPECTORATED SPUTUM ASSESSMENT W GRAM STAIN, RFLX TO RESP C

## 2022-09-12 LAB — CULTURE, RESPIRATORY W GRAM STAIN

## 2022-09-12 MED ORDER — HYDROMORPHONE HCL 1 MG/ML IJ SOLN
0.5000 mg | Freq: Four times a day (QID) | INTRAMUSCULAR | Status: DC | PRN
  Administered 2022-09-12 – 2022-09-13 (×3): 0.5 mg via INTRAVENOUS
  Filled 2022-09-12 (×3): qty 0.5

## 2022-09-12 MED ORDER — QUETIAPINE FUMARATE 100 MG PO TABS: 400.0000 mg | ORAL_TABLET | Freq: Every day | ORAL | Status: DC

## 2022-09-12 MED ORDER — OXYCODONE HCL 5 MG PO TABS
10.0000 mg | ORAL_TABLET | ORAL | Status: DC | PRN
  Administered 2022-09-12 – 2022-09-13 (×3): 15 mg via ORAL
  Filled 2022-09-12 (×3): qty 3

## 2022-09-12 MED ORDER — NICOTINE 7 MG/24HR TD PT24
7.0000 mg | MEDICATED_PATCH | Freq: Every day | TRANSDERMAL | Status: DC
  Administered 2022-09-13: 7 mg via TRANSDERMAL
  Filled 2022-09-12 (×2): qty 1

## 2022-09-12 MED ORDER — NICOTINE POLACRILEX 2 MG MT GUM
2.0000 mg | CHEWING_GUM | Freq: Two times a day (BID) | OROMUCOSAL | Status: DC | PRN
  Administered 2022-09-12: 2 mg via ORAL
  Filled 2022-09-12 (×2): qty 1

## 2022-09-12 MED ORDER — CLONAZEPAM 0.5 MG PO TABS
0.5000 mg | ORAL_TABLET | Freq: Three times a day (TID) | ORAL | Status: DC
  Administered 2022-09-12 – 2022-09-13 (×2): 0.5 mg via ORAL
  Filled 2022-09-12 (×3): qty 1

## 2022-09-12 MED ORDER — BOOST / RESOURCE BREEZE PO LIQD CUSTOM: 1.0000 | ORAL | Status: DC

## 2022-09-12 MED ORDER — POLYETHYLENE GLYCOL 3350 17 G PO PACK
17.0000 g | PACK | Freq: Two times a day (BID) | ORAL | Status: DC
  Administered 2022-09-13: 17 g via ORAL
  Filled 2022-09-12 (×2): qty 1

## 2022-09-12 MED ORDER — PROSOURCE TF20 ENFIT COMPATIBL EN LIQD
60.0000 mL | Freq: Two times a day (BID) | ENTERAL | Status: DC
  Administered 2022-09-12: 60 mL
  Filled 2022-09-12: qty 60

## 2022-09-12 NOTE — TOC CM/SW Note (Addendum)
RE:  [name]. Abalos   Date of Birth:   1977/02/24___________ Date: 09/12/2022       To Whom It May Concern:  Please be advised that the above-named patient will require a short-term nursing home stay - anticipated 30 days or less for rehabilitation and strengthening.  The plan is for return home.          MD signature   ___     Date

## 2022-09-12 NOTE — Discharge Instructions (Signed)
            Patient is to contact ECT coordinator at [phone] to reschedule her ECT sessions at discharge.
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
## 2022-09-12 NOTE — NC FL2 (Signed)
[place] MEDICAID FL2 LEVEL OF CARE FORM     IDENTIFICATION  Patient Name: [name] Birthdate: May 25, 1975 Sex: female Admission Date (Current Location): 09/01/2022  [place] and [place] Number:  Haynes Bast [id number] Facility and Address:  [facility], [address], [place], [place] [address]      Provider Number: [id number]  Attending Physician Name and Address:  Dr. Kris Mouton  Relative Name and Phone Number:  Margarita Mail, daughter: [phone]    Current Level of Care: Hospital Recommended Level of Care: Skilled Nursing Facility Prior Approval Number:    Date Approved/Denied:   PASRR Number: [id number]  Discharge Plan: SNF    Current Diagnoses: Patient Active Problem List   Diagnosis Date Noted   Pneumothorax on left 09/01/2022   Pancreatic insufficiency 04/26/2021   Dysphagia    UTI (urinary tract infection) 01/21/2021   Thrombocytosis 01/21/2021   Elevated lipase 01/21/2021   Hypokalemia 01/21/2021   Fibromyalgia 01/21/2021   History of pancreatitis 01/21/2021   Nausea without vomiting 05/24/2020   Abdominal pain 05/24/2020   Severe protein-calorie malnutrition (HCC) 05/24/2020   Encounter by telehealth for suspected COVID-19 03/20/2019   Abdominal pain, epigastric 10/07/2018   Intractable vomiting 08/09/2018   Compression fracture of fifth lumbar vertebra (HCC) 02/07/2018   Altered mental status 02/07/2018   Generalized weakness 01/24/2018   MDD (major depressive disorder) 04/23/2017   Substance abuse (HCC) 04/19/2017   Hyperthyroidism 02/17/2015   Anxiety 01/28/2015   Clinical depression 01/28/2015   COPD with acute exacerbation (HCC) 11/26/2013   Vaginitis and vulvovaginitis 11/26/2013   Recurrent pneumonia 02/02/2013   Left arm pain 01/13/2013   Left arm pain 01/13/2013   Dizziness and giddiness 01/13/2013   Acute bronchitis 12/07/2012   Rib pain 12/07/2012   Insomnia 08/26/2012   Diarrhea 06/19/2012   Heme  positive stool 06/19/2012   Bloating 06/19/2012   Nausea alone 06/19/2012   Bladder retention 04/24/2012   Tremor 03/05/2012   Dysuria 03/05/2012   Failure to thrive in adult 06/21/2011   Airway hyperreactivity 03/08/2011   Back pain, chronic 03/08/2011   Chronic obstructive pulmonary disease (HCC) 03/08/2011   Acid reflux 03/08/2011   Cystitis 01/22/2011   Bilateral hand numbness 01/13/2011   WEIGHT LOSS 06/24/2010   RIB PAIN, LEFT SIDED 05/04/2010   ABDOMINAL PAIN, LEFT UPPER QUADRANT 05/04/2010   Unspecified otitis media 10/26/2009   BRONCHITIS, RECURRENT 08/23/2009   URI, ACUTE 08/04/2009   FATIGUE 07/06/2009   Irritable bowel syndrome with both constipation and diarrhea 03/11/2009   ANAL FISSURE 03/11/2009   RECTAL BLEEDING 03/11/2009   Chronic interstitial cystitis 03/11/2009   COLONIC POLYPS, HX OF 03/11/2009   GERD 02/05/2009   SMOKER 12/04/2008   chronic asthma poorly controlled 12/04/2008   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   BENZODIAZEPINE ADDICTION 08/31/2007   Bipolar disorder (HCC) 08/31/2007   HYPERTENSION 08/31/2007   EMPHYSEMA 08/31/2007   NEPHROLITHIASIS 08/31/2007   ARTHRITIS 08/31/2007   FIBROMYALGIA 08/31/2007   SLEEP APNEA 08/31/2007    Orientation RESPIRATION BLADDER Height & Weight     Self, Time, Situation, Place  Normal External catheter Weight: 45.1 kg Height:  5' (152.4 cm)  BEHAVIORAL SYMPTOMS/MOOD NEUROLOGICAL BOWEL NUTRITION STATUS      Incontinent Feeding tube, Diet (PEG tube with nocturnal feeds;nocturnal tube feeds via PEG:  - Osmolite 1.2 @ 83 ml/hr x 12 hours from 1800 to 0600 (total of 996 ml)  - PROSource TF20 60 ml daily; regular, thin liquid diet)  AMBULATORY  STATUS COMMUNICATION OF NEEDS Skin   Extensive Assist Verbally Skin abrasions (blisters, abrasions-bilateral arms, chest.  Foam dressings applied.)                       Personal Care Assistance Level of Assistance  Bathing, Feeding, Dressing Bathing Assistance:  Maximum assistance Feeding assistance: Limited assistance Dressing Assistance: Maximum assistance     Functional Limitations Info             SPECIAL CARE FACTORS FREQUENCY  PT (By licensed PT), OT (By licensed OT)     PT Frequency: 5x weekly OT Frequency: 5x weekly            Contractures Contractures Info: Not present    Additional Factors Info  Code Status, Allergies, Psychotropic Code Status Info: Full code Allergies Info: Amitriptyline-anaphylaxis; Metoclopramide HCL-seizures; Abilify- Throat swelling, tremors; Propoxyphene-rash; Toradol-hypersensitivity; Tramadol-swelling, rash; Ambien-hallucinations; Eszopiclone-hallucinations, hyperactivity, bad taste in mouth, Fentanyl-Itching, tremors, tachycardia; Varenicline-suicidal thoughts; Buprenorphine-itching, hives; Demerol-rash; Emetrol-hives, itching, rash; morphine/codeine-hives, itching Psychotropic Info: Straterra, Klonopin, Zyprexa, Seroquel         Current Medications (09/12/2022):  This is the current hospital active medication list Current Facility-Administered Medications  Medication Dose Route Frequency Provider Last Rate Last Admin   0.9 %  sodium chloride infusion   Intravenous PRN Violeta Gelinas, MD   Stopped at 09/01/22 1449   0.9 %  sodium chloride infusion   Intra-arterial PRN Kinsinger, De Blanch, MD 20 mL/hr at 09/09/22 0909 New Bag at 09/09/22 0909   acetaminophen (TYLENOL) tablet 1,000 mg  1,000 mg Oral Q8H Violeta Gelinas, MD   1,000 mg at 09/12/22 1231   albuterol (PROVENTIL) (2.5 MG/3ML) 0.083% nebulizer solution 3 mL  3 mL Inhalation Q6H PRN Trixie Deis R, PA-C   3 mL at 09/11/22 1008   atomoxetine (STRATTERA) capsule 10 mg  10 mg Oral Daily Juliet Rude, PA-C   10 mg at 09/12/22 1610   bacitracin ointment   Topical BID Darnell Level, MD   31.5 Application at 09/12/22 0819   busPIRone (BUSPAR) tablet 15 mg  15 mg Oral TID Diamantina Monks, MD   15 mg at 09/12/22 1556   Chlorhexidine  Gluconate Cloth 2 % PADS 6 each  6 each Topical Daily Violeta Gelinas, MD   6 each at 09/12/22 0819   clonazePAM (KLONOPIN) tablet 0.5 mg  0.5 mg Oral TID Cinderella, Margaret A   0.5 mg at 09/12/22 1556   cyclobenzaprine (FLEXERIL) tablet 5 mg  5 mg Per Tube TID PRN Violeta Gelinas, MD   5 mg at 09/12/22 1230   cycloSPORINE (RESTASIS) 0.05 % ophthalmic emulsion 1 drop  1 drop Both Eyes BID Trixie Deis R, PA-C   1 drop at 09/12/22 0816   docusate (COLACE) 50 MG/5ML liquid 100 mg  100 mg Oral BID Violeta Gelinas, MD   100 mg at 09/10/22 2127   enoxaparin (LOVENOX) injection 30 mg  30 mg Subcutaneous Q12H Violeta Gelinas, MD   30 mg at 09/12/22 9604   estradiol (ESTRACE) tablet 2 mg  2 mg Oral Daily Violeta Gelinas, MD   2 mg at 09/12/22 0814   [START ON 09/13/2022] feeding supplement (BOOST / RESOURCE BREEZE) liquid 1 Container  1 Container Oral Q24H Diamantina Monks, MD       feeding supplement (OSMOLITE 1.2 CAL) liquid 1,000 mL  1,000 mL Per Tube Q24H Violeta Gelinas, MD   1,000 mL at 09/11/22 1745   feeding supplement (PROSource TF20)  liquid 60 mL  60 mL Per Tube BID Diamantina Monks, MD       fluticasone (FLONASE) 50 MCG/ACT nasal spray 2 spray  2 spray Each Nare Daily PRN Juliet Rude, PA-C       folic acid (FOLVITE) tablet 1 mg  1 mg Oral Daily Violeta Gelinas, MD   1 mg at 09/12/22 0813   gabapentin (NEURONTIN) 250 MG/5ML solution 800 mg  800 mg Per Tube TID Violeta Gelinas, MD   800 mg at 09/12/22 0930   hydrALAZINE (APRESOLINE) injection 10 mg  10 mg Intravenous Q2H PRN Violeta Gelinas, MD       HYDROmorphone (DILAUDID) injection 0.5 mg  0.5 mg Intravenous Q6H PRN Jacinto Halim, PA-C   0.5 mg at 09/12/22 1118   hydrOXYzine (ATARAX) tablet 50 mg  50 mg Oral QHS Violeta Gelinas, MD   50 mg at 09/11/22 2210   lipase/protease/amylase (CREON) capsule 36,000 Units  36,000 Units Oral TID WC & HS Violeta Gelinas, MD   36,000 Units at 09/12/22 1240   metoprolol tartrate (LOPRESSOR) injection  5 mg  5 mg Intravenous Q4H PRN Kinsinger, De Blanch, MD   5 mg at 09/09/22 1112   metoprolol tartrate (LOPRESSOR) tablet 12.5 mg  12.5 mg Oral BID Violeta Gelinas, MD   12.5 mg at 09/12/22 0813   mometasone-formoterol (DULERA) 100-5 MCG/ACT inhaler 2 puff  2 puff Inhalation BID Jacinto Halim, PA-C   2 puff at 09/12/22 0809   multivitamin with minerals tablet 1 tablet  1 tablet Oral Daily Violeta Gelinas, MD   1 tablet at 09/12/22 0813   nicotine (NICODERM CQ - dosed in mg/24 hr) patch 7 mg  7 mg Transdermal Daily Carrion-Carrero, Margely, MD       nicotine polacrilex (NICORETTE) gum 2 mg  2 mg Oral BID PRN Lorri Frederick, MD   2 mg at 09/12/22 1556   OLANZapine (ZYPREXA) tablet 5 mg  5 mg Oral Daily Violeta Gelinas, MD   5 mg at 09/12/22 0813   ondansetron (ZOFRAN-ODT) disintegrating tablet 4 mg  4 mg Oral Q6H PRN Quenton Fetter, RPH       Or   ondansetron (ZOFRAN) injection 4 mg  4 mg Intravenous Q6H PRN Link Snuffer B, RPH   4 mg at 09/07/22 1830   Oral care mouth rinse  15 mL Mouth Rinse PRN Diamantina Monks, MD       oxybutynin (DITROPAN-XL) 24 hr tablet 15 mg  15 mg Oral Daily Violeta Gelinas, MD   15 mg at 09/12/22 1610   oxyCODONE (Oxy IR/ROXICODONE) immediate release tablet 10-15 mg  10-15 mg Oral Q4H PRN Jacinto Halim, PA-C   15 mg at 09/12/22 1231   pantoprazole (PROTONIX) EC tablet 40 mg  40 mg Oral Daily Calton Dach I, RPH   40 mg at 09/12/22 9604   polyethylene glycol (MIRALAX / GLYCOLAX) packet 17 g  17 g Per Tube Daily PRN Link Snuffer B, RPH       polyethylene glycol (MIRALAX / GLYCOLAX) packet 17 g  17 g Oral BID Maczis, Casimiro Needle M, PA-C       prochlorperazine (COMPAZINE) injection 10 mg  10 mg Intravenous Q6H PRN Violeta Gelinas, MD       [START ON 09/13/2022] QUEtiapine (SEROQUEL) tablet 400 mg  400 mg Oral QHS Cinderella, Margaret A       sodium chloride flush (NS) 0.9 % injection 10-40 mL  10-40 mL Intracatheter Q12H Thompson,  Laurell Josephs, MD   10 mL  at 09/12/22 0815   sodium chloride flush (NS) 0.9 % injection 10-40 mL  10-40 mL Intracatheter PRN Violeta Gelinas, MD       thiamine (VITAMIN B1) tablet 100 mg  100 mg Oral Daily Violeta Gelinas, MD   100 mg at 09/12/22 7846     Discharge Medications: Please see discharge summary for a list of discharge medications.  Relevant Imaging Results:  Relevant Lab Results:   Additional Information SSN [id number]  Quintella Baton, RN, BSN  Trauma/Neuro ICU Case Manager [phone]

## 2022-09-12 NOTE — TOC Progression Note (Signed)
Transition of Care [facility]) - Progression Note    Patient Details  Name: JAALIYAH LOAYZA MRN: [id number] Date of Birth: 1975/10/29  [facility] CM/SW Contact  Astrid Drafts Berna Spare, RN Phone Number: 09/12/2022, 4:31 PM  Clinical Narrative:    Sherron Monday with Colin Mulders in admissions at [facility]; she states that patient is declined for admission due to her age.  I have spoken with patient, and she states that she is trying to work something out with a friend.  Her mother has declined to bring her back into her home.  Will follow-up with patient in a.m. to discuss disposition.   Expected Discharge Plan: Skilled Nursing Facility Barriers to Discharge: Continued Medical Work up  Expected Discharge Plan and Services   Discharge Planning Services: CM Consult   Living arrangements for the past 2 months: Single Family Home                                       Social Determinants of Health (SDOH) Interventions SDOH Screenings   Alcohol Screen: Low Risk  (04/23/2017)  Depression (PHQ2-9): Medium Risk (02/02/2020)  Tobacco Use: High Risk (09/01/2022)    Readmission Risk Interventions    02/01/2021    8:38 AM  Readmission Risk Prevention Plan  Transportation Screening Complete  Medication Review (RN Care Manager) Complete  PCP or Specialist appointment within 3-5 days of discharge Complete  HRI or Home Care Consult Complete  SW Recovery Care/Counseling Consult Complete  Palliative Care Screening Not Applicable  Skilled Nursing Facility Not Applicable   Quintella Baton, RN, BSN  Trauma/Neuro ICU Case Manager [phone]

## 2022-09-12 NOTE — Progress Notes (Signed)
Paged MD about patients anxiety. No new orders at this time.
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
## 2022-09-13 ENCOUNTER — Other Ambulatory Visit (HOSPITAL_COMMUNITY): Payer: Self-pay

## 2022-09-13 ENCOUNTER — Inpatient Hospital Stay (HOSPITAL_COMMUNITY): Payer: BC Managed Care – PPO

## 2022-09-13 DIAGNOSIS — F319 Bipolar disorder, unspecified: Secondary | ICD-10-CM | POA: Diagnosis not present

## 2022-09-13 DIAGNOSIS — J939 Pneumothorax, unspecified: Secondary | ICD-10-CM | POA: Diagnosis not present

## 2022-09-13 LAB — CULTURE, RESPIRATORY W GRAM STAIN

## 2022-09-13 LAB — GLUCOSE, CAPILLARY
Glucose-Capillary: 92 mg/dL (ref 70–99)
Glucose-Capillary: 113 mg/dL — ABNORMAL HIGH (ref 70–99)
Glucose-Capillary: 111 mg/dL — ABNORMAL HIGH (ref 70–99)

## 2022-09-13 MED ORDER — METOPROLOL TARTRATE 25 MG PO TABS
12.5000 mg | ORAL_TABLET | Freq: Two times a day (BID) | ORAL | 0 refills | Status: AC
  Filled 2022-09-13: qty 14, 14d supply, fill #0

## 2022-09-13 MED ORDER — CYCLOBENZAPRINE HCL 10 MG PO TABS: 5.0000 mg | ORAL_TABLET | Freq: Three times a day (TID) | ORAL | Status: DC

## 2022-09-13 MED ORDER — OXYCODONE HCL 10 MG PO TABS
10.0000 mg | ORAL_TABLET | Freq: Four times a day (QID) | ORAL | 0 refills | Status: AC | PRN
  Filled 2022-09-13: qty 25, 5d supply, fill #0

## 2022-09-13 MED ORDER — BACITRACIN ZINC 500 UNIT/GM EX OINT
TOPICAL_OINTMENT | Freq: Two times a day (BID) | CUTANEOUS | 0 refills | Status: DC
  Filled 2022-09-13: qty 56.8, 14d supply, fill #0

## 2022-09-13 MED ORDER — DOXYCYCLINE HYCLATE 100 MG PO TABS
100.0000 mg | ORAL_TABLET | Freq: Two times a day (BID) | ORAL | 0 refills | Status: DC
  Filled 2022-09-13: qty 10, 5d supply, fill #0

## 2022-09-13 MED ORDER — HYDROMORPHONE HCL 1 MG/ML IJ SOLN
0.5000 mg | Freq: Two times a day (BID) | INTRAMUSCULAR | Status: DC | PRN
  Administered 2022-09-13: 0.5 mg via INTRAVENOUS
  Filled 2022-09-13: qty 0.5

## 2022-09-13 MED ORDER — OLANZAPINE 5 MG PO TABS
5.0000 mg | ORAL_TABLET | Freq: Every day | ORAL | 0 refills | Status: DC
  Filled 2022-09-13: qty 30, 30d supply, fill #0

## 2022-09-13 MED ORDER — GABAPENTIN 400 MG PO CAPS
800.0000 mg | ORAL_CAPSULE | Freq: Three times a day (TID) | ORAL | Status: DC
  Administered 2022-09-13: 800 mg via ORAL
  Filled 2022-09-13: qty 2

## 2022-09-13 MED ORDER — NICOTINE 7 MG/24HR TD PT24: 7.0000 mg | MEDICATED_PATCH | Freq: Every day | TRANSDERMAL | 0 refills | Status: DC

## 2022-09-13 MED ORDER — BUSPIRONE HCL 15 MG PO TABS
15.0000 mg | ORAL_TABLET | Freq: Three times a day (TID) | ORAL | 0 refills | Status: AC
  Filled 2022-09-13: qty 65, 22d supply, fill #0

## 2022-09-13 NOTE — Progress Notes (Signed)
Per MD order, PICC line removed. Pressure held until hemostatis acquired. Patient instructed to remain on bedrest for 30 minutes post removal. Further instructions included: keep dressing clean, dry, and intact for 24 hours; call MD for signs of infection (redness, swelling, pain, discharge at site; fever). Patient verbalized understanding.
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
## 2022-09-13 NOTE — TOC Transition Note (Signed)
Transition of Care [facility]) - CM/SW Discharge Note   Patient Details  Name: [name] MRN: [id number] Date of Birth: 1975/08/03  Transition [facility] Care [facility] CM/SW Contact:  Glennon Mac, RN Phone Number: 09/13/2022, 1:57 PM   Clinical Narrative:    Patient medically stable for discharge home today; she is able to shower and ambulate independently in her room with rolling walker.  This morning, patient stated she was discharging home with her mother, and she was very excited about this.  Later in the day, her mother changed her mind, and stated that she could not come to her home.  Patient has chosen to discharge to a hotel, and has arranged for [facility] Goad transport.  Referral was made to [employer] for rolling walker, which was delivered to bedside prior to discharge.  Discharge medications were sent to [facility] pharmacy to be filled and delivered to patient's room prior to discharge. Will make referral to [facility] main rehab for OP therapy services Final next level of care: OP Rehab Barriers to Discharge: Barriers Resolved                         Discharge Plan and Services Additional resources added to the After Visit Summary for     Discharge Planning Services: CM Consult            DME Arranged: Dan Humphreys rolling DME Agency: [employer] Date DME Agency Contacted: 09/13/22 Time DME Agency Contacted: 1202 Representative spoke with at DME Agency: Mickeal Needy            Social Determinants of Health (SDOH) Interventions SDOH Screenings   Alcohol Screen: Low Risk  (04/23/2017)  Depression (PHQ2-9): Medium Risk (02/02/2020)  Tobacco Use: High Risk (09/01/2022)     Readmission Risk Interventions    02/01/2021    8:38 AM  Readmission Risk Prevention Plan  Transportation Screening Complete  Medication Review (RN Care Manager) Complete  PCP or Specialist appointment within 3-5 days of discharge Complete  HRI or Home Care Consult Complete  SW Recovery Care/Counseling Consult Complete   Palliative Care Screening Not Applicable  Skilled Nursing Facility Not Applicable
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
## 2022-09-13 NOTE — Plan of Care (Signed)
  Problem: Skin Integrity: Goal: Risk for impaired skin integrity will decrease Outcome: Progressing   Problem: Education: Goal: Knowledge of General Education information will improve Description: Including pain rating scale, medication(s)/side effects and non-pharmacologic comfort measures Outcome: Not Progressing   Problem: Health Behavior/Discharge Planning: Goal: Ability to manage health-related needs will improve Outcome: Not Progressing   Problem: Activity: Goal: Risk for activity intolerance will decrease Outcome: Not Progressing   Problem: Coping: Goal: Level of anxiety will decrease Outcome: Not Progressing   Problem: Pain Managment: Goal: General experience of comfort will improve Outcome: Not Progressing

## 2022-09-15 ENCOUNTER — Ambulatory Visit (HOSPITAL_COMMUNITY): Payer: BC Managed Care – PPO | Attending: Physician Assistant | Admitting: Occupational Therapy

## 2022-09-15 LAB — CULTURE, RESPIRATORY W GRAM STAIN

## 2022-09-17 ENCOUNTER — Other Ambulatory Visit: Payer: Self-pay

## 2022-09-17 ENCOUNTER — Encounter (HOSPITAL_COMMUNITY): Payer: Self-pay | Admitting: *Deleted

## 2022-09-17 ENCOUNTER — Emergency Department (HOSPITAL_COMMUNITY)
Admission: EM | Admit: 2022-09-17 | Discharge: 2022-09-17 | Disposition: A | Discharge status: Home / Self Care | Payer: BC Managed Care – PPO | Attending: Emergency Medicine | Admitting: Emergency Medicine

## 2022-09-17 ENCOUNTER — Emergency Department (HOSPITAL_COMMUNITY): Payer: BC Managed Care – PPO

## 2022-09-17 DIAGNOSIS — J449 Chronic obstructive pulmonary disease, unspecified: Secondary | ICD-10-CM | POA: Diagnosis not present

## 2022-09-17 DIAGNOSIS — Y92002 Bathroom of unspecified non-institutional (private) residence single-family (private) house as the place of occurrence of the external cause: Secondary | ICD-10-CM | POA: Insufficient documentation

## 2022-09-17 DIAGNOSIS — S20212A Contusion of left front wall of thorax, initial encounter: Secondary | ICD-10-CM | POA: Diagnosis not present

## 2022-09-17 DIAGNOSIS — R0781 Pleurodynia: Secondary | ICD-10-CM | POA: Diagnosis present

## 2022-09-17 DIAGNOSIS — I10 Essential (primary) hypertension: Secondary | ICD-10-CM | POA: Insufficient documentation

## 2022-09-17 DIAGNOSIS — S5011XA Contusion of right forearm, initial encounter: Secondary | ICD-10-CM | POA: Diagnosis not present

## 2022-09-17 DIAGNOSIS — W010XXA Fall on same level from slipping, tripping and stumbling without subsequent striking against object, initial encounter: Secondary | ICD-10-CM | POA: Diagnosis not present

## 2022-09-17 DIAGNOSIS — Z79899 Other long term (current) drug therapy: Secondary | ICD-10-CM | POA: Insufficient documentation

## 2022-09-17 DIAGNOSIS — W19XXXA Unspecified fall, initial encounter: Secondary | ICD-10-CM

## 2022-09-17 MED ORDER — HYDROMORPHONE HCL 1 MG/ML IJ SOLN
1.0000 mg | Freq: Once | INTRAMUSCULAR | Status: AC
  Administered 2022-09-17: 1 mg via INTRAMUSCULAR
  Filled 2022-09-17: qty 1

## 2022-09-17 NOTE — Discharge Instructions (Signed)
You can apply ice packs on and off to your chest wall.  Keep your upcoming appointment with your primary care provider for recheck.
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
## 2022-09-20 ENCOUNTER — Other Ambulatory Visit (HOSPITAL_COMMUNITY): Payer: Self-pay

## 2022-09-29 ENCOUNTER — Ambulatory Visit (HOSPITAL_COMMUNITY): Payer: BC Managed Care – PPO | Admitting: Occupational Therapy

## 2022-10-02 ENCOUNTER — Telehealth: Payer: Self-pay | Admitting: Physician Assistant

## 2022-10-02 NOTE — Telephone Encounter (Signed)
Encounter made in error. 

## 2022-10-10 ENCOUNTER — Encounter (INDEPENDENT_AMBULATORY_CARE_PROVIDER_SITE_OTHER): Payer: Self-pay | Admitting: Gastroenterology

## 2022-10-10 ENCOUNTER — Telehealth (INDEPENDENT_AMBULATORY_CARE_PROVIDER_SITE_OTHER): Payer: Self-pay

## 2022-10-10 ENCOUNTER — Ambulatory Visit (INDEPENDENT_AMBULATORY_CARE_PROVIDER_SITE_OTHER): Payer: BC Managed Care – PPO | Admitting: Gastroenterology

## 2022-10-10 VITALS — BP 109/80 | HR 74 | Temp 97.5°F | Ht 64.0 in | Wt 90.6 lb

## 2022-10-10 DIAGNOSIS — K219 Gastro-esophageal reflux disease without esophagitis: Secondary | ICD-10-CM | POA: Diagnosis not present

## 2022-10-10 DIAGNOSIS — R197 Diarrhea, unspecified: Secondary | ICD-10-CM

## 2022-10-10 DIAGNOSIS — R1310 Dysphagia, unspecified: Secondary | ICD-10-CM | POA: Diagnosis not present

## 2022-10-10 DIAGNOSIS — R109 Unspecified abdominal pain: Secondary | ICD-10-CM

## 2022-10-10 MED ORDER — PANTOPRAZOLE SODIUM 40 MG PO TBEC: 40.0000 mg | DELAYED_RELEASE_TABLET | Freq: Two times a day (BID) | ORAL | 3 refills | Status: DC

## 2022-10-10 NOTE — Patient Instructions (Addendum)
-  Increase your protonix 40mg  to twice daily, I have sent a refill to your pharmacy -Start daily probiotic, you can get any brand over the counter, look at the back and try to get one with atleast 3-4 strains of good bacteria, take this a few hours apart from your antibiotics -We will schedule you for an upper endoscopy for evaluation of your swallowing issues -I will check stool studies to rule out infection in the stool causing diarrhea   Follow up 4 months   It was a pleasure to see you today. I want to create trusting relationships with patients and provide genuine, compassionate, and quality care. I truly value your feedback! please be on the lookout for a survey regarding your visit with me today. I appreciate your input about our visit and your time in completing this!    Salvatrice Morandi L. Jeanmarie Hubert, MSN, APRN, AGNP-C Adult-Gerontology Nurse Practitioner [facility]

## 2022-10-10 NOTE — Telephone Encounter (Signed)
Approved from 10/10/2022-10/10/2023

## 2022-10-10 NOTE — Progress Notes (Addendum)
Referring Provider: Toma Deiters, MD Primary Care Physician:  Toma Deiters, MD Primary GI Physician: Levon Hedger   Chief Complaint  Patient presents with   Dysphagia    Patient here today due to having issues with swallowing. Patient says she can not eat thick foods and she gets choked frequently. Patient also has a feeding tube, but says she still has to eat.   HPI:   [name] is a 47 y.o. female with past medical history of BIpolar disorder, cervical cancer, COPD, depression, fibromyalgia, GERD, HTN, IBS, seizures, hx of substance abuse, OSA, HLD.   Patient presenting today for dysphagia and diarrhea   Last seen January 2023, at that time recently diagnosed with pancreatitis by Dr. Olena Leatherwood (question if this was a diagnosis of pancreatic insufficiency as no documented imaging to confirm pancreatitis) who prescribed her creon. Still having some episodes of nausea maybe 1-2x/week, previously everyday all day. mix of constipation and diarrhea, 1-2 BMs per day.  on omeprazole 40mg  once daily for her GERD, having heartburn almost anytime she eats or anytime she drinks any soda. States that she was doing well with weight gain since getting her PEG tube, appetite good. She reports that dysphagia has improved some, still has some issues with thicker foods  Recommended protonix 40mg  daily, stop omeprazole, continue creon, schedule colonoscopy, continue remeron.  Present: Patient reports dysphagia Is an ongoing issue for about a year that has worsened recently. She Is avoiding thicker foods. She notes she has had to have the heimlich maneuver done on her a few times.  Sometimes food gets lodged in her throat, just at sternal notch. She also avoids dairy as well. Dysphagia is occurs anytime she eats, as well as with liquids/pills. She is on protonix 40mg  daily. She is having GERD symptoms daily. She notes burning in her chest and throat, she will induce vomiting to get the acid up.    She notes  she has had some rectal bleeding for the past 4 days. She notes she is seeing some BRB on toilet tissue. She is having diarrhea. She notes up to about 10 episodes of diarrhea per day that also began about 4 days ago. At baseline typically having 2 looser stools/diarrhea per day. She is notably on doxycycline 100mg  BID. She was also recently started on metoprolol in May. She remains on creon for her pancreas. She has some abdominal cramping with her diarrhea.   Colonoscopy 05/2021:- Five 3 to 10 mm polyps in the descending colon,                            in the transverse colon, in the ascending colon and                            at the ileocecal valve, removed with a cold snare.                            Resected and retrieved.                           - The distal rectum and anal verge are normal on                            retroflexion view. (5  TAs, largest 1 cm) Flex sig:06/11/20 - attempted colonoscopy, converted to flex sig due to presence of solid stool, Stool in the rectum, in the recto-sigmoid colon and in the sigmoid colon. - No specimens collected. Last Endoscopy:06/11/20 - White nummular plaques in esophageal mucosa. (Candida) - Normal stomach. - Normal examined duodenum. Biopsied.   Recommendations:  Repeat colonoscopy 3 years    Past Medical History:  Diagnosis Date   Anal fissure 03/11/2009   Anemia    Anxiety and depression    ARTHRITIS 08/31/2007   Asthma    BENZODIAZEPINE ADDICTION 08/31/2007   Bipolar 1 disorder (HCC)    Bowel obstruction (HCC)    Breakdown of urinary electronic stimulator device, init (HCC)    BRONCHITIS, RECURRENT 08/23/2009   Asthmatic Bronchitis-Dr. Sherene Sires.....-HFA 75% 12/04/2008>75% 02/05/2009>75% 08/04/2009 -PFT's 01/04/2009 2.56 (86%) ratio 75, no resp to B2 and DLC0 67% > 80 after correction    Cancer (HCC)    cervical cancer   Chronic interstitial cystitis 03/11/2009   Chronic nausea    Chronic pain    COLONIC POLYPS, HX OF 07/25/2006    ADENOMATOUS POLYP   COPD (chronic obstructive pulmonary disease) (HCC)    Endometriosis    FIBROMYALGIA 08/31/2007   GERD 02/05/2009   Hyperlipidemia    HYPERTENSION 08/31/2007   IBS 03/11/2009   Internal hemorrhoids    Migraine headache    NEPHROLITHIASIS 08/31/2007   Pancreatitis    PONV (postoperative nausea and vomiting)    RECTAL BLEEDING 03/11/2009   Sciatica    right leg   Seizures (HCC)    been about 1 year since last seisure per pt   SLEEP APNEA 08/31/2007   Substance abuse (HCC)    Thyroid disease    Uterine cyst     Past Surgical History:  Procedure Laterality Date   ABDOMINAL HYSTERECTOMY     BIOPSY  10/25/2018   Procedure: BIOPSY;  Surgeon: Malissa Hippo, MD;  [facility]: [facility];  Service: Endoscopy;;  gastric   BIOPSY  06/11/2020   Procedure: BIOPSY;  Surgeon: Marguerita Merles, Reuel Boom, MD;  Location: [facility];  Service: Gastroenterology;;  small bowel   bladder stretching x6     BLADDER SURGERY     stimulator placed and stretching    CHOLECYSTECTOMY     COLONOSCOPY     COLONOSCOPY WITH PROPOFOL N/A 09/03/2014   Procedure: COLONOSCOPY WITH PROPOFOL;  Surgeon: Rachael Fee, MD;  Location: [facility];  Service: Endoscopy;  Laterality: N/A;   COLONOSCOPY WITH PROPOFOL N/A 05/20/2021   Procedure: COLONOSCOPY WITH PROPOFOL;  Surgeon: Dolores Frame, MD;  Location: [facility];  Service: Gastroenterology;  Laterality: N/A;  10:10 / Asa 2 pt states she has had a hysterectomy   ESOPHAGOGASTRODUODENOSCOPY (EGD) WITH PROPOFOL N/A 10/25/2018   Procedure: ESOPHAGOGASTRODUODENOSCOPY (EGD) WITH PROPOFOL;  Surgeon: Malissa Hippo, MD;  [facility]: [facility];  Service: Endoscopy;  Laterality: N/A;  11:15   ESOPHAGOGASTRODUODENOSCOPY (EGD) WITH PROPOFOL N/A 06/11/2020   Procedure: ESOPHAGOGASTRODUODENOSCOPY (EGD) WITH PROPOFOL;  Surgeon: Dolores Frame, MD;  Location: [facility];  Service: Gastroenterology;  Laterality: N/A;    interstitial cystitis     IR GASTROSTOMY TUBE MOD SED  01/27/2021   PACEMAKER INSERTION     in hip for interstitial cystitis   pacemaker removal     PEG TUBE PLACEMENT     POLYPECTOMY  05/20/2021   Procedure: POLYPECTOMY;  Surgeon: Dolores Frame, MD;  Location: [facility];  Service: Gastroenterology;;  removal of uterine cyst and scrapped uterus     replaced bladder pacemaker      Current Outpatient Medications  Medication Sig Dispense Refill   acetaminophen (TYLENOL) 500 MG tablet Take 1,000 mg by mouth every 6 (six) hours as needed for mild pain, fever or headache.     albuterol (PROAIR HFA) 108 (90 Base) MCG/ACT inhaler INHALE 2 PUFFS EVERY 6 HOURS AS NEEDED FOR SHORTNESS OF BREATH AND WHEEZING. (Patient taking differently: Inhale 2 puffs into the lungs every 6 (six) hours as needed for shortness of breath or wheezing.) 8.5 g 1   atomoxetine (STRATTERA) 10 MG capsule Take 10 mg by mouth daily.     bacitracin ointment Apply topically 2 (two) times daily. 120 g 0   busPIRone (BUSPAR) 15 MG tablet Take 1 tablet (15 mg total) by mouth 3 (three) times daily. 90 tablet 0   clonazePAM (KLONOPIN) 0.5 MG tablet Take 1 tablet (0.5 mg total) by mouth 3 (three) times daily as needed for anxiety. (Patient taking differently: Take 0.5 mg by mouth 3 (three) times daily.) 90 tablet 1   cyclobenzaprine (FLEXERIL) 5 MG tablet Take 1 tablet (5 mg total) by mouth 3 (three) times daily as needed for muscle spasms. 15 tablet 0   doxycycline (VIBRA-TABS) 100 MG tablet Take 1 tablet (100 mg total) by mouth 2 (two) times daily. 10 tablet 0   estradiol (ESTRACE) 0.1 MG/GM vaginal cream Place 1 Applicatorful vaginally every 3 (three) days.     fluticasone (FLONASE) 50 MCG/ACT nasal spray Place 2 sprays into both nostrils daily. (Patient taking differently: Place 2 sprays into both nostrils daily as needed for allergies.) 16 g 6   gabapentin (NEURONTIN) 800 MG tablet Take 0.5 tablets (400 mg total)  by mouth in the morning, at noon, in the evening, and at bedtime.     hydrOXYzine (ATARAX/VISTARIL) 50 MG tablet Take 50 mg by mouth at bedtime.     Hyoscyamine Sulfate SL 0.125 MG SUBL Take 0.125 mg by mouth every 4 (four) hours as needed (for cramping).     lipase/protease/amylase (CREON) 36000 UNITS CPEP capsule Take 1 capsule (36,000 Units total) by mouth 4 (four) times daily -  with meals and at bedtime. 180 capsule 7   metoprolol tartrate (LOPRESSOR) 25 MG tablet Take 0.5 tablets (12.5 mg total) by mouth 2 (two) times daily. (Patient taking differently: Take 25 mg by mouth 2 (two) times daily.) 14 tablet 0   nicotine (NICODERM CQ - DOSED IN MG/24 HR) 7 mg/24hr patch Place 1 patch (7 mg total) onto the skin daily. 28 patch 0   OLANZapine (ZYPREXA) 5 MG tablet Take 1 tablet (5 mg total) by mouth daily. 30 tablet 0   oxybutynin (DITROPAN XL) 15 MG 24 hr tablet Take 15 mg by mouth daily.     Oxycodone HCl 10 MG TABS Take 1-1.5 tablets (10-15 mg total) by mouth every 6 (six) hours as needed for breakthrough pain. 25 tablet 0   pantoprazole (PROTONIX) 40 MG tablet Take 1 tablet (40 mg total) by mouth daily. 60 tablet 3   promethazine (PHENERGAN) 25 MG tablet TAKE (1) TABLET EVERY SIX HOURS AS NEEDED FOR NAUSEA AND VOMITING. 12 tablet 1   QUEtiapine (SEROQUEL) 400 MG tablet Take 400 mg by mouth at bedtime.      RESTASIS 0.05 % ophthalmic emulsion Place 1 drop into both eyes 2 (two) times daily.     SYMBICORT 80-4.5 MCG/ACT inhaler INHALE 2 PUFFS INTO THE LUNGS  TWICE DAILY. 10.2 g 0   No current facility-administered medications for this visit.    Allergies as of 10/10/2022 - Review Complete 10/10/2022  Allergen Reaction Noted   Abilify [aripiprazole] Swelling, Palpitations, and Other (See Comments) 12/28/2011   Amitriptyline Anaphylaxis 12/06/2012   Metoclopramide hcl Other (See Comments)    Propoxyphene Rash 02/23/2015   Toradol [ketorolac tromethamine]  03/20/2020   Tramadol Swelling, Other  (See Comments), and Rash 12/28/2011   Ambien [zolpidem tartrate] Other (See Comments) 12/28/2011   Eszopiclone Other (See Comments) 06/21/2011   Fentanyl Itching 04/02/2022   Varenicline Other (See Comments) 06/19/2013   Buprenorphine hcl Itching and Hives 05/12/2014   Demerol [meperidine] Rash 06/15/2013   Emetrol Hives, Itching, and Rash 06/19/2013   Morphine and codeine Hives and Itching 12/28/2011    Family History  Problem Relation Age of Onset   Heart disease Father    Asthma Maternal Grandmother    Emphysema Maternal Grandfather    Cancer Maternal Grandfather        Lung Cancer   Cancer Other        Lung Cancer-Aunt   Colon cancer Neg Hx    Esophageal cancer Neg Hx    Rectal cancer Neg Hx    Stomach cancer Neg Hx    Thyroid disease Neg Hx     Social History   Socioeconomic History   Marital status: Married    Spouse name: Not on file   Number of children: 2   Years of education: Not on file   Highest education level: Not on file  Occupational History    Employer: UNEMPLOYED  Tobacco Use   Smoking status: Every Day    Packs/day: 1.00    Years: 14.00    Additional pack years: 0.00    Total pack years: 14.00    Types: Cigarettes   Smokeless tobacco: Never  Vaping Use   Vaping Use: Former  Substance and Sexual Activity   Alcohol use: Not Currently    Comment: last drink 3 weeks ago; recently released from rehab   Drug use: Yes    Types: Marijuana    Comment: once a month   Sexual activity: Not on file  Other Topics Concern   Not on file  Social History Narrative   Homemaker   Daily Caffeine Use-Mtn. Dew         Social Determinants of Corporate investment banker Strain: Not on file  Food Insecurity: Not on file  Transportation Needs: Not on file  Physical Activity: Not on file  Stress: Not on file  Social Connections: Not on file   Review of systems General: negative for malaise, night sweats, fever, chills, weight loss Neck: Negative for  lumps, goiter, pain and significant neck swelling Resp: Negative for cough, wheezing, dyspnea at rest CV: Negative for chest pain, leg swelling, palpitations, orthopnea GI: denies melena, hematochezia, nausea, vomiting, constipation, odyonophagia, early satiety or unintentional weight loss. +diarrhea +rectal bleeding +dysphagia  MSK: Negative for joint pain or swelling, back pain, and muscle pain. Derm: Negative for itching or rash Psych: Denies depression, anxiety, memory loss, confusion. No homicidal or suicidal ideation.  Heme: Negative for prolonged bleeding, bruising easily, and swollen nodes. Endocrine: Negative for cold or heat intolerance, polyuria, polydipsia and goiter. Neuro: negative for tremor, gait imbalance, syncope and seizures. The remainder of the review of systems is noncontributory.  Physical Exam: BP 109/80 (BP Location: Left Arm, Patient Position: Sitting, Cuff Size: Normal)   Pulse 74  Temp (!) 97.5 F (36.4 C) (Temporal)   Ht 5\' 4"  (1.626 m)   Wt 90 lb 9.6 oz (41.1 kg)   BMI 15.55 kg/m  General:   Alert and oriented. No distress noted. Pleasant and cooperative. Malnourished  Head:  Normocephalic and atraumatic. Eyes:  Conjuctiva clear without scleral icterus. Mouth:  Oral mucosa pink and moist. Good dentition. No lesions. Heart: Normal rate and rhythm, s1 and s2 heart sounds present.  Lungs: Clear lung sounds in all lobes. Respirations equal and unlabored. Abdomen:  +BS, soft, and non-distended. Generalized TTP of abdomen. No rebound or guarding. No HSM or masses noted. Feeding tube noted left of umbilicus.  Derm: No palmar erythema or jaundice Msk:  Symmetrical without gross deformities. Normal posture. Extremities:  Without edema. Neurologic:  Alert and  oriented x4 Psych:  Alert and cooperative. Normal mood and affect.  Invalid input(s): "6 MONTHS"   ASSESSMENT: [name] is a 47 y.o. female presenting today for dysphagia, also with diarrhea    Dysphagia/GERD: reports difficulty swallowing for the past year though worse recently. Avoiding thicker foods but has issues with pills and liquids as well. Her GERD is not well controlled on protonix 40mg  daily, reports acid regurgitation and burning in her throat almost daily. History of candida esophagitis on last EGD in 2022, notably on doxcycline recently so certainly concern for presence of pill induced esophagitis, recommend proceeding with EGD +/- dilation for further evaluation. Will increase protonix 40mg  to BID dosing at this time, should continue with chewing precautions as she is doing.  Diarrhea/rectal bleeding: history of EPI and diarrhea secondary to this, though noting an increase in frequency of diarrhea over the past few days with upwards of 10 watery stools per day, also with some toilet tissue hematochezia. As above, recently started on doxycycline so certainly concern for C diff colitis secondary to doxy. Will test for C diff and GI pathogen panel to rule out infection. Recommend starting daily probiotic, which should be taken a few hours apart from her antibiotic doses. Suspect rectal bleeding secondary to hemorrhoids/irritation from frequent BMs or could be secondary to infectious process, she recent Colonoscopy last year. She should make me aware if this worsens/becomes heavier.   PLAN:  Schedule EGD +/- dilation ASA III 2. GI pathogen panel/ C diff  3. Start daily probiotic (take a few hours apart from antibiotic doses) 4. Increase PPI to BID dosing 5. Chewing precautions  All questions were answered, patient verbalized understanding and is in agreement with plan as outlined above.   Follow Up: 4 months   Clinten Howk L. Jeanmarie Hubert, MSN, APRN, AGNP-C Adult-Gerontology Nurse Practitioner [facility]  I have reviewed the note and agree with the APP's assessment as described in this progress note  Katrinka Blazing, MD Gastroenterology and  Hepatology [facility]astroenter[facility]

## 2022-10-10 NOTE — Telephone Encounter (Signed)
Pantoprazole 40 mg bid Approved per patient insurance.

## 2022-10-10 NOTE — H&P (View-Only) (Signed)
Referring Provider: Toma Deiters, MD Primary Care Physician:  Toma Deiters, MD Primary GI Physician: Levon Hedger   Chief Complaint  Patient presents with   Dysphagia    Patient here today due to having issues with swallowing. Patient says she can not eat thick foods and she gets choked frequently. Patient also has a feeding tube, but says she still has to eat.   HPI:   [name] is a 47 y.o. female with past medical history of BIpolar disorder, cervical cancer, COPD, depression, fibromyalgia, GERD, HTN, IBS, seizures, hx of substance abuse, OSA, HLD.   Patient presenting today for dysphagia and diarrhea   Last seen January 2023, at that time recently diagnosed with pancreatitis by Dr. Olena Leatherwood (question if this was a diagnosis of pancreatic insufficiency as no documented imaging to confirm pancreatitis) who prescribed her creon. Still having some episodes of nausea maybe 1-2x/week, previously everyday all day. mix of constipation and diarrhea, 1-2 BMs per day.  on omeprazole 40mg  once daily for her GERD, having heartburn almost anytime she eats or anytime she drinks any soda. States that she was doing well with weight gain since getting her PEG tube, appetite good. She reports that dysphagia has improved some, still has some issues with thicker foods  Recommended protonix 40mg  daily, stop omeprazole, continue creon, schedule colonoscopy, continue remeron.  Present: Patient reports dysphagia Is an ongoing issue for about a year that has worsened recently. She Is avoiding thicker foods. She notes she has had to have the heimlich maneuver done on her a few times.  Sometimes food gets lodged in her throat, just at sternal notch. She also avoids dairy as well. Dysphagia is occurs anytime she eats, as well as with liquids/pills. She is on protonix 40mg  daily. She is having GERD symptoms daily. She notes burning in her chest and throat, she will induce vomiting to get the acid up.    She notes  she has had some rectal bleeding for the past 4 days. She notes she is seeing some BRB on toilet tissue. She is having diarrhea. She notes up to about 10 episodes of diarrhea per day that also began about 4 days ago. At baseline typically having 2 looser stools/diarrhea per day. She is notably on doxycycline 100mg  BID. She was also recently started on metoprolol in May. She remains on creon for her pancreas. She has some abdominal cramping with her diarrhea.   Colonoscopy 05/2021:- Five 3 to 10 mm polyps in the descending colon,                            in the transverse colon, in the ascending colon and                            at the ileocecal valve, removed with a cold snare.                            Resected and retrieved.                           - The distal rectum and anal verge are normal on                            retroflexion view. (5  TAs, largest 1 cm) Flex sig:06/11/20 - attempted colonoscopy, converted to flex sig due to presence of solid stool, Stool in the rectum, in the recto-sigmoid colon and in the sigmoid colon. - No specimens collected. Last Endoscopy:06/11/20 - White nummular plaques in esophageal mucosa. (Candida) - Normal stomach. - Normal examined duodenum. Biopsied.   Recommendations:  Repeat colonoscopy 3 years    Past Medical History:  Diagnosis Date   Anal fissure 03/11/2009   Anemia    Anxiety and depression    ARTHRITIS 08/31/2007   Asthma    BENZODIAZEPINE ADDICTION 08/31/2007   Bipolar 1 disorder (HCC)    Bowel obstruction (HCC)    Breakdown of urinary electronic stimulator device, init (HCC)    BRONCHITIS, RECURRENT 08/23/2009   Asthmatic Bronchitis-Dr. Sherene Sires.....-HFA 75% 12/04/2008>75% 02/05/2009>75% 08/04/2009 -PFT's 01/04/2009 2.56 (86%) ratio 75, no resp to B2 and DLC0 67% > 80 after correction    Cancer (HCC)    cervical cancer   Chronic interstitial cystitis 03/11/2009   Chronic nausea    Chronic pain    COLONIC POLYPS, HX OF 07/25/2006    ADENOMATOUS POLYP   COPD (chronic obstructive pulmonary disease) (HCC)    Endometriosis    FIBROMYALGIA 08/31/2007   GERD 02/05/2009   Hyperlipidemia    HYPERTENSION 08/31/2007   IBS 03/11/2009   Internal hemorrhoids    Migraine headache    NEPHROLITHIASIS 08/31/2007   Pancreatitis    PONV (postoperative nausea and vomiting)    RECTAL BLEEDING 03/11/2009   Sciatica    right leg   Seizures (HCC)    been about 1 year since last seisure per pt   SLEEP APNEA 08/31/2007   Substance abuse (HCC)    Thyroid disease    Uterine cyst     Past Surgical History:  Procedure Laterality Date   ABDOMINAL HYSTERECTOMY     BIOPSY  10/25/2018   Procedure: BIOPSY;  Surgeon: Malissa Hippo, MD;  [facility]: [facility];  Service: Endoscopy;;  gastric   BIOPSY  06/11/2020   Procedure: BIOPSY;  Surgeon: Marguerita Merles, Reuel Boom, MD;  Location: [facility];  Service: Gastroenterology;;  small bowel   bladder stretching x6     BLADDER SURGERY     stimulator placed and stretching    CHOLECYSTECTOMY     COLONOSCOPY     COLONOSCOPY WITH PROPOFOL N/A 09/03/2014   Procedure: COLONOSCOPY WITH PROPOFOL;  Surgeon: Rachael Fee, MD;  Location: [facility];  Service: Endoscopy;  Laterality: N/A;   COLONOSCOPY WITH PROPOFOL N/A 05/20/2021   Procedure: COLONOSCOPY WITH PROPOFOL;  Surgeon: Dolores Frame, MD;  Location: [facility];  Service: Gastroenterology;  Laterality: N/A;  10:10 / Asa 2 pt states she has had a hysterectomy   ESOPHAGOGASTRODUODENOSCOPY (EGD) WITH PROPOFOL N/A 10/25/2018   Procedure: ESOPHAGOGASTRODUODENOSCOPY (EGD) WITH PROPOFOL;  Surgeon: Malissa Hippo, MD;  [facility]: [facility];  Service: Endoscopy;  Laterality: N/A;  11:15   ESOPHAGOGASTRODUODENOSCOPY (EGD) WITH PROPOFOL N/A 06/11/2020   Procedure: ESOPHAGOGASTRODUODENOSCOPY (EGD) WITH PROPOFOL;  Surgeon: Dolores Frame, MD;  [facility]: [facility];  Service: Gastroenterology;  Laterality: N/A;    interstitial cystitis     IR GASTROSTOMY TUBE MOD SED  01/27/2021   PACEMAKER INSERTION     in hip for interstitial cystitis   pacemaker removal     PEG TUBE PLACEMENT     POLYPECTOMY  05/20/2021   Procedure: POLYPECTOMY;  Surgeon: Dolores Frame, MD;  Location: [facility];  Service: Gastroenterology;;  removal of uterine cyst and scrapped uterus     replaced bladder pacemaker      Current Outpatient Medications  Medication Sig Dispense Refill   acetaminophen (TYLENOL) 500 MG tablet Take 1,000 mg by mouth every 6 (six) hours as needed for mild pain, fever or headache.     albuterol (PROAIR HFA) 108 (90 Base) MCG/ACT inhaler INHALE 2 PUFFS EVERY 6 HOURS AS NEEDED FOR SHORTNESS OF BREATH AND WHEEZING. (Patient taking differently: Inhale 2 puffs into the lungs every 6 (six) hours as needed for shortness of breath or wheezing.) 8.5 g 1   atomoxetine (STRATTERA) 10 MG capsule Take 10 mg by mouth daily.     bacitracin ointment Apply topically 2 (two) times daily. 120 g 0   busPIRone (BUSPAR) 15 MG tablet Take 1 tablet (15 mg total) by mouth 3 (three) times daily. 90 tablet 0   clonazePAM (KLONOPIN) 0.5 MG tablet Take 1 tablet (0.5 mg total) by mouth 3 (three) times daily as needed for anxiety. (Patient taking differently: Take 0.5 mg by mouth 3 (three) times daily.) 90 tablet 1   cyclobenzaprine (FLEXERIL) 5 MG tablet Take 1 tablet (5 mg total) by mouth 3 (three) times daily as needed for muscle spasms. 15 tablet 0   doxycycline (VIBRA-TABS) 100 MG tablet Take 1 tablet (100 mg total) by mouth 2 (two) times daily. 10 tablet 0   estradiol (ESTRACE) 0.1 MG/GM vaginal cream Place 1 Applicatorful vaginally every 3 (three) days.     fluticasone (FLONASE) 50 MCG/ACT nasal spray Place 2 sprays into both nostrils daily. (Patient taking differently: Place 2 sprays into both nostrils daily as needed for allergies.) 16 g 6   gabapentin (NEURONTIN) 800 MG tablet Take 0.5 tablets (400 mg total)  by mouth in the morning, at noon, in the evening, and at bedtime.     hydrOXYzine (ATARAX/VISTARIL) 50 MG tablet Take 50 mg by mouth at bedtime.     Hyoscyamine Sulfate SL 0.125 MG SUBL Take 0.125 mg by mouth every 4 (four) hours as needed (for cramping).     lipase/protease/amylase (CREON) 36000 UNITS CPEP capsule Take 1 capsule (36,000 Units total) by mouth 4 (four) times daily -  with meals and at bedtime. 180 capsule 7   metoprolol tartrate (LOPRESSOR) 25 MG tablet Take 0.5 tablets (12.5 mg total) by mouth 2 (two) times daily. (Patient taking differently: Take 25 mg by mouth 2 (two) times daily.) 14 tablet 0   nicotine (NICODERM CQ - DOSED IN MG/24 HR) 7 mg/24hr patch Place 1 patch (7 mg total) onto the skin daily. 28 patch 0   OLANZapine (ZYPREXA) 5 MG tablet Take 1 tablet (5 mg total) by mouth daily. 30 tablet 0   oxybutynin (DITROPAN XL) 15 MG 24 hr tablet Take 15 mg by mouth daily.     Oxycodone HCl 10 MG TABS Take 1-1.5 tablets (10-15 mg total) by mouth every 6 (six) hours as needed for breakthrough pain. 25 tablet 0   pantoprazole (PROTONIX) 40 MG tablet Take 1 tablet (40 mg total) by mouth daily. 60 tablet 3   promethazine (PHENERGAN) 25 MG tablet TAKE (1) TABLET EVERY SIX HOURS AS NEEDED FOR NAUSEA AND VOMITING. 12 tablet 1   QUEtiapine (SEROQUEL) 400 MG tablet Take 400 mg by mouth at bedtime.      RESTASIS 0.05 % ophthalmic emulsion Place 1 drop into both eyes 2 (two) times daily.     SYMBICORT 80-4.5 MCG/ACT inhaler INHALE 2 PUFFS INTO THE LUNGS  TWICE DAILY. 10.2 g 0   No current facility-administered medications for this visit.    Allergies as of 10/10/2022 - Review Complete 10/10/2022  Allergen Reaction Noted   Abilify [aripiprazole] Swelling, Palpitations, and Other (See Comments) 12/28/2011   Amitriptyline Anaphylaxis 12/06/2012   Metoclopramide hcl Other (See Comments)    Propoxyphene Rash 02/23/2015   Toradol [ketorolac tromethamine]  03/20/2020   Tramadol Swelling, Other  (See Comments), and Rash 12/28/2011   Ambien [zolpidem tartrate] Other (See Comments) 12/28/2011   Eszopiclone Other (See Comments) 06/21/2011   Fentanyl Itching 04/02/2022   Varenicline Other (See Comments) 06/19/2013   Buprenorphine hcl Itching and Hives 05/12/2014   Demerol [meperidine] Rash 06/15/2013   Emetrol Hives, Itching, and Rash 06/19/2013   Morphine and codeine Hives and Itching 12/28/2011    Family History  Problem Relation Age of Onset   Heart disease Father    Asthma Maternal Grandmother    Emphysema Maternal Grandfather    Cancer Maternal Grandfather        Lung Cancer   Cancer Other        Lung Cancer-Aunt   Colon cancer Neg Hx    Esophageal cancer Neg Hx    Rectal cancer Neg Hx    Stomach cancer Neg Hx    Thyroid disease Neg Hx     Social History   Socioeconomic History   Marital status: Married    Spouse name: Not on file   Number of children: 2   Years of education: Not on file   Highest education level: Not on file  Occupational History    Employer: UNEMPLOYED  Tobacco Use   Smoking status: Every Day    Packs/day: 1.00    Years: 14.00    Additional pack years: 0.00    Total pack years: 14.00    Types: Cigarettes   Smokeless tobacco: Never  Vaping Use   Vaping Use: Former  Substance and Sexual Activity   Alcohol use: Not Currently    Comment: last drink 3 weeks ago; recently released from rehab   Drug use: Yes    Types: Marijuana    Comment: once a month   Sexual activity: Not on file  Other Topics Concern   Not on file  Social History Narrative   Homemaker   Daily Caffeine Use-Mtn. Dew         Social Determinants of Corporate investment banker Strain: Not on file  Food Insecurity: Not on file  Transportation Needs: Not on file  Physical Activity: Not on file  Stress: Not on file  Social Connections: Not on file   Review of systems General: negative for malaise, night sweats, fever, chills, weight loss Neck: Negative for  lumps, goiter, pain and significant neck swelling Resp: Negative for cough, wheezing, dyspnea at rest CV: Negative for chest pain, leg swelling, palpitations, orthopnea GI: denies melena, hematochezia, nausea, vomiting, constipation, odyonophagia, early satiety or unintentional weight loss. +diarrhea +rectal bleeding +dysphagia  MSK: Negative for joint pain or swelling, back pain, and muscle pain. Derm: Negative for itching or rash Psych: Denies depression, anxiety, memory loss, confusion. No homicidal or suicidal ideation.  Heme: Negative for prolonged bleeding, bruising easily, and swollen nodes. Endocrine: Negative for cold or heat intolerance, polyuria, polydipsia and goiter. Neuro: negative for tremor, gait imbalance, syncope and seizures. The remainder of the review of systems is noncontributory.  Physical Exam: BP 109/80 (BP Location: Left Arm, Patient Position: Sitting, Cuff Size: Normal)   Pulse 74  Temp (!) 97.5 F (36.4 C) (Temporal)   Ht 5\' 4"  (1.626 m)   Wt 90 lb 9.6 oz (41.1 kg)   BMI 15.55 kg/m  General:   Alert and oriented. No distress noted. Pleasant and cooperative. Malnourished  Head:  Normocephalic and atraumatic. Eyes:  Conjuctiva clear without scleral icterus. Mouth:  Oral mucosa pink and moist. Good dentition. No lesions. Heart: Normal rate and rhythm, s1 and s2 heart sounds present.  Lungs: Clear lung sounds in all lobes. Respirations equal and unlabored. Abdomen:  +BS, soft, and non-distended. Generalized TTP of abdomen. No rebound or guarding. No HSM or masses noted. Feeding tube noted left of umbilicus.  Derm: No palmar erythema or jaundice Msk:  Symmetrical without gross deformities. Normal posture. Extremities:  Without edema. Neurologic:  Alert and  oriented x4 Psych:  Alert and cooperative. Normal mood and affect.  Invalid input(s): "6 MONTHS"   ASSESSMENT: WONNIE FETCHO is a 47 y.o. female presenting today for dysphagia, also with diarrhea    Dysphagia/GERD: reports difficulty swallowing for the past year though worse recently. Avoiding thicker foods but has issues with pills and liquids as well. Her GERD is not well controlled on protonix 40mg  daily, reports acid regurgitation and burning in her throat almost daily. History of candida esophagitis on last EGD in 2022, notably on doxcycline recently so certainly concern for presence of pill induced esophagitis, recommend proceeding with EGD +/- dilation for further evaluation. Will increase protonix 40mg  to BID dosing at this time, should continue with chewing precautions as she is doing.  Diarrhea/rectal bleeding: history of EPI and diarrhea secondary to this, though noting an increase in frequency of diarrhea over the past few days with upwards of 10 watery stools per day, also with some toilet tissue hematochezia. As above, recently started on doxycycline so certainly concern for C diff colitis secondary to doxy. Will test for C diff and GI pathogen panel to rule out infection. Recommend starting daily probiotic, which should be taken a few hours apart from her antibiotic doses. Suspect rectal bleeding secondary to hemorrhoids/irritation from frequent BMs or could be secondary to infectious process, she recent Colonoscopy last year. She should make me aware if this worsens/becomes heavier.   PLAN:  Schedule EGD +/- dilation ASA III 2. GI pathogen panel/ C diff  3. Start daily probiotic (take a few hours apart from antibiotic doses) 4. Increase PPI to BID dosing 5. Chewing precautions  All questions were answered, patient verbalized understanding and is in agreement with plan as outlined above.   Follow Up: 4 months   Lindsay L. Jeanmarie Hubert, MSN, APRN, AGNP-C Adult-Gerontology Nurse Practitioner [facility]  I have reviewed the note and agree with the APP's assessment as described in this progress note  Lindsay Blazing, MD Gastroenterology and  Hepatology [facility]astroenter[facility]

## 2022-10-11 ENCOUNTER — Telehealth (INDEPENDENT_AMBULATORY_CARE_PROVIDER_SITE_OTHER): Payer: Self-pay | Admitting: Gastroenterology

## 2022-10-11 NOTE — Telephone Encounter (Signed)
Pt contacted with pre op appt. Pre op scheduled for 10/24/22 at 8 am [facility]

## 2022-10-20 ENCOUNTER — Other Ambulatory Visit: Payer: Self-pay | Admitting: Medical

## 2022-10-24 ENCOUNTER — Encounter (HOSPITAL_COMMUNITY)
Admission: RE | Admit: 2022-10-24 | Discharge: 2022-10-24 | Disposition: A | Discharge status: Home / Self Care | Payer: BC Managed Care – PPO | Source: Ambulatory Visit | Attending: Gastroenterology | Admitting: Gastroenterology

## 2022-10-24 ENCOUNTER — Encounter (HOSPITAL_COMMUNITY): Payer: Self-pay

## 2022-10-24 ENCOUNTER — Other Ambulatory Visit: Payer: Self-pay

## 2022-10-24 HISTORY — DX: Personal history of urinary calculi: Z87.442

## 2022-10-26 ENCOUNTER — Encounter (HOSPITAL_COMMUNITY)
Admission: RE | Disposition: A | Discharge status: Home / Self Care | Payer: Self-pay | Source: Ambulatory Visit | Attending: Gastroenterology

## 2022-10-26 ENCOUNTER — Ambulatory Visit (HOSPITAL_COMMUNITY): Payer: BC Managed Care – PPO | Admitting: Anesthesiology

## 2022-10-26 ENCOUNTER — Encounter (HOSPITAL_COMMUNITY): Payer: Self-pay

## 2022-10-26 ENCOUNTER — Ambulatory Visit (HOSPITAL_COMMUNITY)
Admission: RE | Admit: 2022-10-26 | Discharge: 2022-10-26 | Disposition: A | Discharge status: Home / Self Care | Payer: BC Managed Care – PPO | Source: Ambulatory Visit | Attending: Gastroenterology | Admitting: Gastroenterology

## 2022-10-26 DIAGNOSIS — K209 Esophagitis, unspecified without bleeding: Secondary | ICD-10-CM | POA: Diagnosis not present

## 2022-10-26 DIAGNOSIS — K200 Eosinophilic esophagitis: Secondary | ICD-10-CM | POA: Insufficient documentation

## 2022-10-26 DIAGNOSIS — K2970 Gastritis, unspecified, without bleeding: Secondary | ICD-10-CM | POA: Insufficient documentation

## 2022-10-26 DIAGNOSIS — K319 Disease of stomach and duodenum, unspecified: Secondary | ICD-10-CM | POA: Diagnosis not present

## 2022-10-26 DIAGNOSIS — R1310 Dysphagia, unspecified: Secondary | ICD-10-CM | POA: Insufficient documentation

## 2022-10-26 DIAGNOSIS — F17210 Nicotine dependence, cigarettes, uncomplicated: Secondary | ICD-10-CM | POA: Diagnosis not present

## 2022-10-26 DIAGNOSIS — Z560 Unemployment, unspecified: Secondary | ICD-10-CM | POA: Diagnosis not present

## 2022-10-26 HISTORY — PX: ESOPHAGOGASTRODUODENOSCOPY (EGD) WITH PROPOFOL: SHX5813

## 2022-10-26 HISTORY — PX: BIOPSY: SHX5522

## 2022-10-26 SURGERY — ESOPHAGOGASTRODUODENOSCOPY (EGD) WITH PROPOFOL
Anesthesia: General

## 2022-10-26 MED ORDER — PROPOFOL 10 MG/ML IV BOLUS
INTRAVENOUS | Status: DC | PRN
  Administered 2022-10-26: 30 mg via INTRAVENOUS
  Administered 2022-10-26: 40 mg via INTRAVENOUS
  Administered 2022-10-26: 100 mg via INTRAVENOUS

## 2022-10-26 MED ORDER — PROPOFOL 500 MG/50ML IV EMUL
INTRAVENOUS | Status: DC | PRN
  Administered 2022-10-26: 150 ug/kg/min via INTRAVENOUS

## 2022-10-26 MED ORDER — LACTATED RINGERS IV SOLN: INTRAVENOUS | Status: DC

## 2022-10-26 MED ORDER — LIDOCAINE HCL (CARDIAC) PF 100 MG/5ML IV SOSY
PREFILLED_SYRINGE | INTRAVENOUS | Status: DC | PRN
  Administered 2022-10-26: 50 mg via INTRAVENOUS

## 2022-10-26 NOTE — Anesthesia Procedure Notes (Signed)
Date/Time: 10/26/2022 12:52 PM  Performed by: Julian Reil, CRNAPre-anesthesia Checklist: Patient identified, Emergency Drugs available, Suction available and Patient being monitored Patient Re-evaluated:Patient Re-evaluated prior to induction Oxygen Delivery Method: Nasal cannula Induction Type: IV induction Placement Confirmation: positive ETCO2

## 2022-10-26 NOTE — Anesthesia Postprocedure Evaluation (Signed)
Anesthesia Post Note  Patient: [name]  Procedure(s) Performed: ESOPHAGOGASTRODUODENOSCOPY (EGD) WITH PROPOFOL BIOPSY  Patient location during evaluation: Phase II Anesthesia Type: General Level of consciousness: awake and alert and oriented Pain management: pain level controlled Vital Signs Assessment: post-procedure vital signs reviewed and stable Respiratory status: spontaneous breathing, nonlabored ventilation and respiratory function stable Cardiovascular status: blood pressure returned to baseline and stable Postop Assessment: no apparent nausea or vomiting Anesthetic complications: no  No notable events documented.   Last Vitals:  Vitals:   10/26/22 1114 10/26/22 1306  BP: 108/70 97/66  Pulse: 69 77  Resp: (!) 8 20  Temp: 36.5 C (!) 36.4 C  SpO2: 100% 99%    Last Pain:  Vitals:   10/26/22 1306  TempSrc:   PainSc: 0-No pain                 Robertine Kipper C Nakeesha Bowler

## 2022-10-26 NOTE — Anesthesia Preprocedure Evaluation (Addendum)
Anesthesia Evaluation  Patient identified by MRN, date of birth, ID band Patient awake    Reviewed: Allergy & Precautions, H&P , NPO status , Patient's Chart, lab work & pertinent test results, reviewed documented beta blocker date and time   History of Anesthesia Complications (+) PONV and history of anesthetic complications  Airway Mallampati: II  TM Distance: >3 FB Neck ROM: Full    Dental  (+) Upper Dentures, Lower Dentures   Pulmonary asthma , sleep apnea , pneumonia, COPD, Current Smoker and Patient abstained from smoking.   Pulmonary exam normal breath sounds clear to auscultation       Cardiovascular Exercise Tolerance: Good hypertension, Pt. on medications and Pt. on home beta blockers Normal cardiovascular exam+ pacemaker  Rhythm:Regular Rate:Normal     Neuro/Psych  Headaches, Seizures -,  PSYCHIATRIC DISORDERS Anxiety Depression Bipolar Disorder    Neuromuscular disease    GI/Hepatic Neg liver ROS,GERD  Medicated,,  Endo/Other   Hyperthyroidism   Renal/GU Renal disease  negative genitourinary   Musculoskeletal  (+)  Fibromyalgia -  Abdominal   Peds negative pediatric ROS (+)  Hematology  (+) Blood dyscrasia, anemia   Anesthesia Other Findings   Reproductive/Obstetrics negative OB ROS                             Anesthesia Physical Anesthesia Plan  ASA: 3  Anesthesia Plan: General   Post-op Pain Management: Minimal or no pain anticipated   Induction: Intravenous  PONV Risk Score and Plan: Treatment may vary due to age or medical condition and Propofol infusion  Airway Management Planned: Nasal Cannula and Natural Airway  Additional Equipment:   Intra-op Plan:   Post-operative Plan:   Informed Consent: I have reviewed the patients History and Physical, chart, labs and discussed the procedure including the risks, benefits and alternatives for the proposed anesthesia  with the patient or authorized representative who has indicated his/her understanding and acceptance.       Plan Discussed with: CRNA and Surgeon  Anesthesia Plan Comments:        Anesthesia Quick Evaluation

## 2022-10-26 NOTE — Interval H&P Note (Signed)
History and Physical Interval Note:  10/26/2022 11:56 AM  [name]  has presented today for surgery, with the diagnosis of dysphagia.  The various methods of treatment have been discussed with the patient and family. After consideration of risks, benefits and other options for treatment, the patient has consented to  Procedure(s) with comments: ESOPHAGOGASTRODUODENOSCOPY (EGD) WITH PROPOFOL (N/A) - 9:00am;asa 3 BALLOON DILATION (N/A) - 9:00am;asa3 as a surgical intervention.  The patient's history has been reviewed, patient examined, no change in status, stable for surgery.  I have reviewed the patient's chart and labs.  Questions were answered to the patient's satisfaction.     Juanetta Beets Correen Bubolz

## 2022-10-26 NOTE — Op Note (Addendum)
[facility] Patient Name: [name] Procedure Date: 10/26/2022 12:34 PM MRN: [id number] Date of Birth: [birth year] Attending MD: Sanjuan Dame , MD, [id number] CSN: [id number] Age: 47 Admit Type: Outpatient Procedure:                Upper GI endoscopy Indications:              Dysphagia Providers:                Sanjuan Dame, MD, Edrick Kins, RN, Dyann Ruddle Referring MD:              Medicines:                Monitored Anesthesia Care Complications:            No immediate complications. Estimated blood loss:                            None. Estimated Blood Loss:     Estimated blood loss: none. Procedure:                Pre-Anesthesia Assessment:                           - Prior to the procedure, a History and Physical                            was performed, and patient medications and                            allergies were reviewed. The patient's tolerance of                            previous anesthesia was also reviewed. The risks                            and benefits of the procedure and the sedation                            options and risks were discussed with the patient.                            All questions were answered, and informed consent                            was obtained. Prior Anticoagulants: The patient has                            taken no anticoagulant or antiplatelet agents. ASA                            Grade Assessment: III - A patient with severe                            systemic disease. After reviewing the risks and  benefits, the patient was deemed in satisfactory                            condition to undergo the procedure.                           After obtaining informed consent, the endoscope was                            passed under direct vision. Throughout the                            procedure, the patient's blood pressure, pulse, and                            oxygen saturations were  monitored continuously. The                            [id number]-[id number] ([id number]) scope was introduced through the                            mouth, and advanced to the second part of duodenum.                            The upper GI endoscopy was accomplished without                            difficulty. The patient tolerated the procedure                            well. Scope In: 12:50:30 PM Scope Out: 1:00:20 PM Total Procedure Duration: 0 hours 9 minutes 50 seconds  Findings:      No endoscopic abnormality was evident in the esophagus to explain the       patient's complaint of dysphagia. Biopsies were obtained from the       proximal and distal esophagus with cold forceps for histology of       suspected eosinophilic esophagitis.      Mild inflammation characterized by erosions and erythema was found in       the gastric antrum. Biopsies were taken with a cold forceps for       histology. Internal bumper for gastro-jujenal tube in place      The duodenal bulb and second portion of the duodenum were normal. Impression:               - No endoscopic esophageal abnormality to explain                            patient's dysphagia.                           - Gastritis. Biopsied. Internal bumper for                            gastro-jujenal tube in place                           -  Normal duodenal bulb and second portion of the                            duodenum.                           - Biopsies were taken with a cold forceps for                            evaluation of eosinophilic esophagitis. Moderate Sedation:      Per Anesthesia Care Recommendation:           - Patient has a contact number available for                            emergencies. The signs and symptoms of potential                            delayed complications were discussed with the                            patient. Return to normal activities tomorrow.                            Written discharge instructions  were provided to the                            patient.                           - Resume previous diet.                           - Continue present medications.                           - Await pathology results.                           - Repeat upper endoscopy for surveillance based on                            pathology results.                           - Return to GI clinic in 4 weeks.                           -If biopsies do not explain dysphagia and patient                            continues to have dysphagia , will consider referal                            for Esophageal manometry Procedure Code(s):        --- Professional ---  16109, Esophagogastroduodenoscopy, flexible,                            transoral; with biopsy, single or multiple Diagnosis Code(s):        --- Professional ---                           R13.10, Dysphagia, unspecified                           K29.70, Gastritis, unspecified, without bleeding CPT copyright 2022 American Medical Association. All rights reserved. The codes documented in this report are preliminary and upon coder review may  be revised to meet current compliance requirements. Sanjuan Dame, MD Sanjuan Dame, MD 10/26/2022 1:09:35 PM This report has been signed electronically. Number of Addenda: 0

## 2022-10-26 NOTE — Transfer of Care (Signed)
Immediate Anesthesia Transfer of Care Note  Patient: [name]  Procedure(s) Performed: ESOPHAGOGASTRODUODENOSCOPY (EGD) WITH PROPOFOL BIOPSY  Patient Location: Short Stay  Anesthesia Type:General  Level of Consciousness: drowsy  Airway & Oxygen Therapy: Patient Spontanous Breathing  Post-op Assessment: Report given to RN and Post -op Vital signs reviewed and stable  Post vital signs: Reviewed and stable  Last Vitals:  Vitals Value Taken Time  BP    Temp    Pulse    Resp    SpO2      Last Pain:  Vitals:   10/26/22 1245  TempSrc:   PainSc: 5          Complications: No notable events documented.

## 2022-10-26 NOTE — Discharge Instructions (Signed)
-   Resume previous diet.  - Continue present medications.  - Await pathology results.  - Repeat upper endoscopy for surveillance based on pathology results.  - Return to GI clinic in 4 weeks.  -If biopsies do not explain dysphagia and patient continues to have dysphagia , will consider referal for Esophageal manometry

## 2022-10-27 LAB — SURGICAL PATHOLOGY

## 2022-10-30 ENCOUNTER — Encounter (INDEPENDENT_AMBULATORY_CARE_PROVIDER_SITE_OTHER): Payer: Self-pay | Admitting: *Deleted

## 2022-11-02 ENCOUNTER — Encounter (HOSPITAL_COMMUNITY): Payer: Self-pay | Admitting: Gastroenterology

## 2022-11-22 NOTE — H&P (Signed)
 ECT H & P Update:   The medical history has been reviewed, remains accurate, and is without interval change. The patient's physiologic condition has not changed significantly in the last 30 days. The condition persists for the listed plan, without new options for care. In addition, no new pharmacological allergies or therapy exist that would change the plan or its appropriateness. The patient and/or family understand the potential benefits and risks.

## 2022-12-05 ENCOUNTER — Telehealth (INDEPENDENT_AMBULATORY_CARE_PROVIDER_SITE_OTHER): Payer: Self-pay | Admitting: Gastroenterology

## 2022-12-05 NOTE — Telephone Encounter (Signed)
Patient presented to the office stated she has a feeding tube - went to [facility] - states they can't take the feeding tube out until 04/25/2023 - would like to know if you could refer to someone else that may be able to take the feeding tube out - please advise - ph# [phone]

## 2022-12-05 NOTE — Telephone Encounter (Signed)
This is usually removed by the physician the placed the tube initially. Otherwise, may make an appointment with general surgery to have the tube removed.

## 2023-02-12 ENCOUNTER — Ambulatory Visit (INDEPENDENT_AMBULATORY_CARE_PROVIDER_SITE_OTHER): Payer: BC Managed Care – PPO | Admitting: Gastroenterology

## 2023-02-12 ENCOUNTER — Encounter (INDEPENDENT_AMBULATORY_CARE_PROVIDER_SITE_OTHER): Payer: Self-pay | Admitting: Gastroenterology

## 2023-02-12 VITALS — BP 100/65 | HR 77 | Temp 97.8°F | Ht 64.0 in | Wt 92.7 lb

## 2023-02-12 DIAGNOSIS — K219 Gastro-esophageal reflux disease without esophagitis: Secondary | ICD-10-CM

## 2023-02-12 DIAGNOSIS — K589 Irritable bowel syndrome without diarrhea: Secondary | ICD-10-CM | POA: Diagnosis not present

## 2023-02-12 DIAGNOSIS — K8681 Exocrine pancreatic insufficiency: Secondary | ICD-10-CM

## 2023-02-12 DIAGNOSIS — R1310 Dysphagia, unspecified: Secondary | ICD-10-CM | POA: Diagnosis not present

## 2023-02-12 DIAGNOSIS — K8689 Other specified diseases of pancreas: Secondary | ICD-10-CM

## 2023-02-12 MED ORDER — OMEPRAZOLE 40 MG PO CPDR: 40.0000 mg | DELAYED_RELEASE_CAPSULE | Freq: Two times a day (BID) | ORAL | 5 refills | Status: AC

## 2023-02-12 NOTE — Patient Instructions (Addendum)
Continue with omeprazole 40mg  twice Continue with creon Continue phenergan as needed for nausea We will refer you to a surgeon to possibly have your feeding tube removed  Follow up 6 months

## 2023-02-12 NOTE — Progress Notes (Signed)
Referring Provider: Toma Deiters, MD Primary Care Physician:  Toma Deiters, MD Primary GI Physician: Dr. Levon Hedger  Chief Complaint  Patient presents with   Follow-up    Follow up visit. Patient would like to have feeding tube removed. States she is not using it and since starting back on creon 4 days ago she is eating better. States she has gained some weight back. States she is homeless and unable to use the feeding tube.    HPI:   [name] is a 47 y.o. female with past medical history of  BIpolar disorder, cervical cancer, COPD, depression, fibromyalgia, GERD, HTN, IBS, seizures, hx of substance abuse, OSA, HLD.    Patient presenting today for follow up of GERD/dysphagia, IBS and EPI  Last seen July 2024, at that time presenting with dysphagia for the past year that worsened recently.  Having GERD symptoms daily.  Also reporting rectal bleeding for the past 4 days.  Also having diarrhea with 10 episodes of diarrhea per day.  Remains on Creon for EPI.  Recommended to schedule EGD, GI pathogen panel, C. difficile testing, start daily probiotic, increase PPI to twice daily dosing, chewing precautions.  Stool testing was not completed   Present: Patient states she needs to get her feeding tube out. She is set to have it removed in January by Dr. Nadara Mustard at [facility] but wants to have it out before January 15th as she is moving out of her current living location, noting she is homeless. States she is no longer using feeding tube and it is getting infected.   States that she has started to gain weight since getting back on her creon. Appetite is improving. had to be off her creon as 3 different providers had written it for it and it needed a PA but she was unsure who she needed to go through to get it. She was finally able to get this figured out. Having a BM maybe once every 2 days now. Stools are softer, much less watery than previously since being back on her creon.  Dysphagia is  some improved. She notes that she thinks dysphagia is secondary to scar tissues in her throat from so many "tubes down my throat" since her teens. she tries to chew thoroughly but sometimes still has issues with foods not going down well. Acid reflux is mostly well controlled as long as she takes her omeprazole BID but notes she is out of this currently and needs a refill.   She is taking phenergan and zofran prn for chronic nausea. She notes that phenergan works better for her when given an IV. Reports nausea is chronic and has been ongoing since she was a child.  She is trying to quit smoking but she is allergic to chantix. She inquired about wellbutrin to help her with this.  Last EGD; 10/2022  - No endoscopic esophageal abnormality to explain                            patient's dysphagia.                           - Gastritis. Biopsied. Internal bumper for                            gastro-jujenal tube in place                           -  Normal duodenal bulb and second portion of the                            duodenum.                           - Biopsies were taken with a cold forceps for                            evaluation of eosinophilic esophagitis. Biopsies normal Colonoscopy 05/2021:- Five 3 to 10 mm polyps in the descending colon,                            in the transverse colon, in the ascending colon and                            at the ileocecal valve, removed with a cold snare.                            Resected and retrieved.                           - The distal rectum and anal verge are normal on                            retroflexion view. (5 TAs, largest 1 cm) Flex sig:06/11/20 - attempted colonoscopy, converted to flex sig due to presence of solid stool, Stool in the rectum, in the recto-sigmoid colon and in the sigmoid colon. - No specimens collected.  Repeat colonoscopy 3 years  Past Medical History:  Diagnosis Date   Anal fissure 03/11/2009   Anemia     Anxiety and depression    ARTHRITIS 08/31/2007   Asthma    BENZODIAZEPINE ADDICTION 08/31/2007   Bipolar 1 disorder (HCC)    Bowel obstruction (HCC)    Breakdown of urinary electronic stimulator device, init (HCC)    BRONCHITIS, RECURRENT 08/23/2009   Asthmatic Bronchitis-Dr. Sherene Sires.....-HFA 75% 12/04/2008>75% 02/05/2009>75% 08/04/2009 -PFT's 01/04/2009 2.56 (86%) ratio 75, no resp to B2 and DLC0 67% > 80 after correction    Cancer (HCC)    cervical cancer   Chronic interstitial cystitis 03/11/2009   Chronic nausea    Chronic pain    COLONIC POLYPS, HX OF 07/25/2006   ADENOMATOUS POLYP   COPD (chronic obstructive pulmonary disease) (HCC)    Endometriosis    FIBROMYALGIA 08/31/2007   GERD 02/05/2009   History of kidney stones    Hyperlipidemia    HYPERTENSION 08/31/2007   IBS 03/11/2009   Internal hemorrhoids    Migraine headache    Pancreatitis    PONV (postoperative nausea and vomiting)    RECTAL BLEEDING 03/11/2009   Sciatica    right leg   Seizures (HCC)    been about 1 year since last seisure per pt   Substance abuse (HCC)    Thyroid disease    Uterine cyst     Past Surgical History:  Procedure Laterality Date   ABDOMINAL HYSTERECTOMY     BIOPSY  10/25/2018   Procedure: BIOPSY;  Surgeon: Malissa Hippo, MD;  [facility]: [facility];  Service: Endoscopy;;  gastric   BIOPSY  06/11/2020   Procedure: BIOPSY;  Surgeon: Dolores Frame, MD;  Location: [facility];  Service: Gastroenterology;;  small bowel   BIOPSY  10/26/2022   Procedure: BIOPSY;  Surgeon: Franky Macho, MD;  Location: [facility];  Service: Endoscopy;;   bladder stretching x6     BLADDER SURGERY     stimulator placed and stretching    CHOLECYSTECTOMY     COLONOSCOPY     COLONOSCOPY WITH PROPOFOL N/A 09/03/2014   Procedure: COLONOSCOPY WITH PROPOFOL;  Surgeon: Rachael Fee, MD;  Location: [facility];  Service: Endoscopy;  Laterality: N/A;   COLONOSCOPY WITH PROPOFOL N/A  05/20/2021   Procedure: COLONOSCOPY WITH PROPOFOL;  Surgeon: Dolores Frame, MD;  [facility]: [facility];  Service: Gastroenterology;  Laterality: N/A;  10:10 / Asa 2 pt states she has had a hysterectomy   ESOPHAGOGASTRODUODENOSCOPY (EGD) WITH PROPOFOL N/A 10/25/2018   Procedure: ESOPHAGOGASTRODUODENOSCOPY (EGD) WITH PROPOFOL;  Surgeon: Malissa Hippo, MD;  [facility];  Service: Endoscopy;  Laterality: N/A;  11:15   ESOPHAGOGASTRODUODENOSCOPY (EGD) WITH PROPOFOL N/A 06/11/2020   Procedure: ESOPHAGOGASTRODUODENOSCOPY (EGD) WITH PROPOFOL;  Surgeon: Dolores Frame, MD;  [facility];  Service: Gastroenterology;  Laterality: N/A;   ESOPHAGOGASTRODUODENOSCOPY (EGD) WITH PROPOFOL N/A 10/26/2022   Procedure: ESOPHAGOGASTRODUODENOSCOPY (EGD) WITH PROPOFOL;  Surgeon: Franky Macho, MD;  [facility];  Service: Endoscopy;  Laterality: N/A;  9:00am;asa 3   interstitial cystitis     IR GASTROSTOMY TUBE MOD SED  01/27/2021   PACEMAKER INSERTION     in hip for interstitial cystitis   pacemaker removal     PEG TUBE PLACEMENT     POLYPECTOMY  05/20/2021   Procedure: POLYPECTOMY;  Surgeon: Marguerita Merles, Reuel Boom, MD;  Location: [facility];  Service: Gastroenterology;;   removal of uterine cyst and scrapped uterus     replaced bladder pacemaker      Current Outpatient Medications  Medication Sig Dispense Refill   acetaminophen (TYLENOL) 500 MG tablet Take 1,000 mg by mouth every 6 (six) hours as needed for mild pain, fever or headache.     albuterol (PROAIR HFA) 108 (90 Base) MCG/ACT inhaler INHALE 2 PUFFS EVERY 6 HOURS AS NEEDED FOR SHORTNESS OF BREATH AND WHEEZING. 8.5 g 1   atomoxetine (STRATTERA) 10 MG capsule Take 10 mg by mouth daily.     budesonide-formoterol (SYMBICORT) 80-4.5 MCG/ACT inhaler Inhale 2 puffs into the lungs 2 (two) times daily. 80 g 2   busPIRone (BUSPAR) 15 MG tablet Take 1 tablet (15 mg total) by mouth 3 (three)  times daily. 90 tablet 0   clonazePAM (KLONOPIN) 0.5 MG tablet Take 1 tablet (0.5 mg total) by mouth 3 (three) times daily as needed for anxiety. (Patient taking differently: Take 0.5 mg by mouth 3 (three) times daily.) 90 tablet 1   cyanocobalamin (VITAMIN B12) 1000 MCG tablet Take 1,000 mcg by mouth daily.     estradiol (ESTRACE) 0.1 MG/GM vaginal cream Place 1 Applicatorful vaginally at bedtime.     fluticasone (FLONASE) 50 MCG/ACT nasal spray Place 2 sprays into both nostrils daily. (Patient taking differently: Place 2 sprays into both nostrils daily as needed for allergies.) 16 g 6   gabapentin (NEURONTIN) 800 MG tablet Take 0.5 tablets (400 mg total) by mouth in the morning, at noon, in the evening, and at bedtime.     hydrOXYzine (ATARAX/VISTARIL) 50 MG tablet Take 50 mg by mouth at  bedtime.     lipase/protease/amylase (CREON) 36000 UNITS CPEP capsule Take 1 capsule (36,000 Units total) by mouth 4 (four) times daily -  with meals and at bedtime. 180 capsule 7   Magnesium 250 MG TABS Take 250 mg by mouth daily.     metoprolol tartrate (LOPRESSOR) 25 MG tablet Take 0.5 tablets (12.5 mg total) by mouth 2 (two) times daily. (Patient taking differently: Take 25 mg by mouth 2 (two) times daily.) 14 tablet 0   OLANZapine (ZYPREXA) 5 MG tablet Take 1 tablet (5 mg total) by mouth daily. 30 tablet 0   omeprazole (PRILOSEC) 40 MG capsule Take 40 mg by mouth daily.     ondansetron (ZOFRAN) 4 MG tablet Take 4 mg by mouth every 8 (eight) hours as needed for nausea or vomiting.     oxybutynin (DITROPAN XL) 15 MG 24 hr tablet Take 15 mg by mouth daily.     Oxycodone HCl 10 MG TABS Take 1-1.5 tablets (10-15 mg total) by mouth every 6 (six) hours as needed for breakthrough pain. 25 tablet 0   pantoprazole (PROTONIX) 40 MG tablet Take 1 tablet (40 mg total) by mouth 2 (two) times daily. 120 tablet 3   promethazine (PHENERGAN) 25 MG tablet TAKE (1) TABLET EVERY SIX HOURS AS NEEDED FOR NAUSEA AND VOMITING. 12  tablet 1   QUEtiapine (SEROQUEL) 400 MG tablet Take 800 mg by mouth at bedtime.     RESTASIS 0.05 % ophthalmic emulsion Place 1 drop into both eyes 2 (two) times daily.     traZODone (DESYREL) 150 MG tablet Take 150 mg by mouth at bedtime.     HYDROcodone-acetaminophen (NORCO) 7.5-325 MG tablet Take 1 tablet by mouth every 6 (six) hours as needed for moderate pain. (Patient not taking: Reported on 02/12/2023)     No current facility-administered medications for this visit.    Allergies as of 02/12/2023 - Review Complete 02/12/2023  Allergen Reaction Noted   Abilify [aripiprazole] Swelling, Palpitations, and Other (See Comments) 12/28/2011   Amitriptyline Anaphylaxis 12/06/2012   Metoclopramide hcl Other (See Comments)    Propoxyphene Rash 02/23/2015   Toradol [ketorolac tromethamine]  03/20/2020   Tramadol Swelling, Other (See Comments), and Rash 12/28/2011   Ambien [zolpidem tartrate] Other (See Comments) 12/28/2011   Eszopiclone Other (See Comments) 06/21/2011   Fentanyl Itching 04/02/2022   Nicoderm [nicotine]  02/12/2023   Varenicline Other (See Comments) 06/19/2013   Buprenorphine hcl Itching and Hives 05/12/2014   Demerol [meperidine] Rash 06/15/2013   Emetrol Hives, Itching, and Rash 06/19/2013   Morphine and codeine Hives and Itching 12/28/2011    Family History  Problem Relation Age of Onset   Heart disease Father    Asthma Maternal Grandmother    Emphysema Maternal Grandfather    Cancer Maternal Grandfather        Lung Cancer   Cancer Other        Lung Cancer-Aunt   Colon cancer Neg Hx    Esophageal cancer Neg Hx    Rectal cancer Neg Hx    Stomach cancer Neg Hx    Thyroid disease Neg Hx     Social History   Socioeconomic History   Marital status: Married    Spouse name: Not on file   Number of children: 2   Years of education: Not on file   Highest education level: Not on file  Occupational History    Employer: UNEMPLOYED  Tobacco Use   Smoking status:  Every Day  Current packs/day: 1.00    Average packs/day: 1 pack/day for 14.0 years (14.0 ttl pk-yrs)    Types: Cigarettes   Smokeless tobacco: Never  Vaping Use   Vaping status: Former  Substance and Sexual Activity   Alcohol use: Not Currently    Comment: last drink 3 weeks ago; recently released from rehab   Drug use: Yes    Types: Marijuana    Comment: once a month   Sexual activity: Not on file  Other Topics Concern   Not on file  Social History Narrative   Homemaker   Daily Caffeine Use-Mtn. Dew         Social Determinants of Corporate investment banker Strain: Not on file  Food Insecurity: Not on file  Transportation Needs: Not on file  Physical Activity: Not on file  Stress: Not on file  Social Connections: Not on file   Review of systems General: negative for malaise, night sweats, fever, chills, weight loss Neck: Negative for lumps, goiter, pain and significant neck swelling Resp: Negative for cough, wheezing, dyspnea at rest CV: Negative for chest pain, leg swelling, palpitations, orthopnea GI: denies melena, hematochezia, nausea, vomiting, diarrhea, constipation, odyonophagia, early satiety or unintentional weight loss. +dysphagia  MSK: Negative for joint pain or swelling, back pain, and muscle pain. Derm: Negative for itching or rash The remainder of the review of systems is noncontributory.  Physical Exam: BP 100/65 (BP Location: Left Arm, Patient Position: Sitting, Cuff Size: Small)   Pulse 77   Temp 97.8 F (36.6 C) (Oral)   Ht 5\' 4"  (1.626 m)   Wt 92 lb 11.2 oz (42 kg)   BMI 15.91 kg/m  General:   Alert and oriented. No distress noted. Pleasant and cooperative.  Head:  Normocephalic and atraumatic. Eyes:  Conjuctiva clear without scleral icterus. Mouth:  Oral mucosa pink and moist. Good dentition. No lesions. Heart: Normal rate and rhythm, s1 and s2 heart sounds present.  Lungs: Clear lung sounds in all lobes. Respirations equal and  unlabored. Abdomen:  +BS, soft, non-tender and non-distended. Feeding tube in place to left mid abdomen. No rebound or guarding. No HSM or masses noted.  Neurologic:  Alert and  oriented x4 Psych:  Alert and cooperative. Normal mood and affect.  Invalid input(s): "6 MONTHS"   ASSESSMENT: KOURTLYNN TREVOR is a 47 y.o. female presenting today for follow up of GERD/dysphagia, IBS and EPI  GERD/Dysphagia: GERD is well-controlled on omeprazole 40 mg twice daily.  She notes dysphagia has improved some though still has some continued episodes at times.  She reports she was told in the past that she had scar tissue in her throat which was contributing to her dysphagia.  She has time to take small bites and chew thoroughly to avoid dysphagia.  As EGD in July showed no esophageal abnormalities to explain her dysphagia, would recommend referring for esophageal manometry if symptoms continue.  IBS/EPI: doing better since restarting her creon, having a BM maybe every 2 days. Stools softer but not watery. Feels appetite is improving. She has started to gain weight back. She has feeding tube that she would like to get out, previously placed by Dr. Nadara Mustard at [facility] and has appt for removal with them in January but states due to her living conditions she needs to have this out sooner. As we do not do removal of feeding tubes, discussed I can refer her to general surgery here in [place] to see if they will remove this.  Patient inquired about wellbutrin to help her quit smoking as she did not tolerate chantix in the past, I recommended she discuss this with PCP or her mental health provider as a possible option.   PLAN:  -continue PPI BID -continue creon -continue phenergan PRN for nausea -referral to Gen surgery for feeding tube removal -consider esophageal manometry if dysphagia persists  -discuss smoking cessation with PCP/mental health provider  All questions were answered, patient verbalized understanding  and is in agreement with plan as outlined above.   Follow Up: 6 months   [name] L. Jeanmarie Hubert, MSN, APRN, AGNP-C Adult-Gerontology Nurse Practitioner [facility]  I have reviewed the note and agree with the APP's assessment as described in this progress note  Lindsay Blazing, MD Gastroenterology and Hepatology [facility]

## 2023-02-14 ENCOUNTER — Other Ambulatory Visit (INDEPENDENT_AMBULATORY_CARE_PROVIDER_SITE_OTHER): Payer: Self-pay | Admitting: Gastroenterology

## 2023-02-14 ENCOUNTER — Telehealth (INDEPENDENT_AMBULATORY_CARE_PROVIDER_SITE_OTHER): Payer: Self-pay | Admitting: Gastroenterology

## 2023-02-14 DIAGNOSIS — R110 Nausea: Secondary | ICD-10-CM

## 2023-02-14 MED ORDER — PROMETHAZINE HCL 12.5 MG PO TABS: 25.0000 mg | ORAL_TABLET | Freq: Four times a day (QID) | ORAL | 1 refills | Status: DC | PRN

## 2023-02-14 MED ORDER — ONDANSETRON HCL 4 MG PO TABS: 4.0000 mg | ORAL_TABLET | Freq: Three times a day (TID) | ORAL | 1 refills | Status: DC | PRN

## 2023-02-14 NOTE — Telephone Encounter (Signed)
Pt.notified

## 2023-02-14 NOTE — Telephone Encounter (Signed)
Pt would like refill on Zofran and Phenergan sent to [facility]. Pt last seen 02/12/23. Please advise. Thank you

## 2023-03-05 ENCOUNTER — Other Ambulatory Visit: Payer: Self-pay

## 2023-03-05 ENCOUNTER — Emergency Department (HOSPITAL_BASED_OUTPATIENT_CLINIC_OR_DEPARTMENT_OTHER)
Admission: EM | Admit: 2023-03-05 | Discharge: 2023-03-05 | Disposition: A | Discharge status: Home / Self Care | Payer: BC Managed Care – PPO | Attending: Emergency Medicine | Admitting: Emergency Medicine

## 2023-03-05 ENCOUNTER — Emergency Department (HOSPITAL_BASED_OUTPATIENT_CLINIC_OR_DEPARTMENT_OTHER): Payer: BC Managed Care – PPO

## 2023-03-05 ENCOUNTER — Encounter (HOSPITAL_BASED_OUTPATIENT_CLINIC_OR_DEPARTMENT_OTHER): Payer: Self-pay | Admitting: Emergency Medicine

## 2023-03-05 DIAGNOSIS — G8918 Other acute postprocedural pain: Secondary | ICD-10-CM | POA: Diagnosis present

## 2023-03-05 DIAGNOSIS — J449 Chronic obstructive pulmonary disease, unspecified: Secondary | ICD-10-CM | POA: Insufficient documentation

## 2023-03-05 DIAGNOSIS — D72829 Elevated white blood cell count, unspecified: Secondary | ICD-10-CM | POA: Diagnosis not present

## 2023-03-05 DIAGNOSIS — L7682 Other postprocedural complications of skin and subcutaneous tissue: Secondary | ICD-10-CM

## 2023-03-05 LAB — CBC WITH DIFFERENTIAL/PLATELET
Abs Immature Granulocytes: 0.04 10*3/uL (ref 0.00–0.07)
Basophils Absolute: 0.1 10*3/uL (ref 0.0–0.1)
Basophils Relative: 1 %
Eosinophils Absolute: 0.2 10*3/uL (ref 0.0–0.5)
Eosinophils Relative: 1 %
HCT: 37 % (ref 36.0–46.0)
Hemoglobin: 12.4 g/dL (ref 12.0–15.0)
Immature Granulocytes: 0 %
Lymphocytes Relative: 45 %
Lymphs Abs: 5.9 10*3/uL — ABNORMAL HIGH (ref 0.7–4.0)
MCH: 30.9 pg (ref 26.0–34.0)
MCHC: 33.5 g/dL (ref 30.0–36.0)
MCV: 92.3 fL (ref 80.0–100.0)
Monocytes Absolute: 0.7 10*3/uL (ref 0.1–1.0)
Monocytes Relative: 5 %
Neutro Abs: 6.3 10*3/uL (ref 1.7–7.7)
Neutrophils Relative %: 48 %
Platelets: 427 10*3/uL — ABNORMAL HIGH (ref 150–400)
RBC: 4.01 MIL/uL (ref 3.87–5.11)
RDW: 13 % (ref 11.5–15.5)
WBC: 13.2 10*3/uL — ABNORMAL HIGH (ref 4.0–10.5)
nRBC: 0 % (ref 0.0–0.2)

## 2023-03-05 LAB — LACTIC ACID, PLASMA: Lactic Acid, Venous: 0.8 mmol/L (ref 0.5–1.9)

## 2023-03-05 LAB — COMPREHENSIVE METABOLIC PANEL
ALT: 6 U/L (ref 0–44)
AST: 17 U/L (ref 15–41)
Albumin: 3.9 g/dL (ref 3.5–5.0)
Alkaline Phosphatase: 85 U/L (ref 38–126)
Anion gap: 7 (ref 5–15)
BUN: <5 mg/dL — ABNORMAL LOW (ref 6–20)
CO2: 27 mmol/L (ref 22–32)
Calcium: 9.2 mg/dL (ref 8.9–10.3)
Chloride: 104 mmol/L (ref 98–111)
Creatinine, Ser: 0.7 mg/dL (ref 0.44–1.00)
GFR, Estimated: >60 mL/min (ref >60)
Glucose, Bld: 85 mg/dL (ref 70–99)
Potassium: 3.3 mmol/L — ABNORMAL LOW (ref 3.5–5.1)
Sodium: 138 mmol/L (ref 135–145)
Total Bilirubin: 0.3 mg/dL (ref <1.2)
Total Protein: 6.7 g/dL (ref 6.5–8.1)

## 2023-03-05 MED ORDER — LIDOCAINE 5 % EX PTCH
1.0000 | MEDICATED_PATCH | CUTANEOUS | Status: DC
  Administered 2023-03-05: 1 via TRANSDERMAL
  Filled 2023-03-05: qty 1
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
## 2023-03-05 NOTE — Discharge Instructions (Signed)
Use ice and any prescribed pain medication from your previous surgery.  You could also use Tylenol or Advil.  Call your doctor tomorrow to be seen in the clinic. Your incision appears extremely well-healing without any evidence of an infection.  Get help right away if you have any new or worsening complaints.  Follow closely with [facility] for further needs.

## 2023-03-05 NOTE — ED Notes (Signed)
D/c paperwork reviewed with pt, including follow up care.  No questions or concerns voiced at time of d/c. Marland Kitchen Pt verbalized understanding, Wheeled by ED staff to ED exit, NAD.

## 2023-03-05 NOTE — ED Provider Notes (Signed)
Shared visit.  Patient having pain at her sacral modulator site in her low back.  There is no sign of infection.  Very well-appearing.  She had normal labs with a little white count elevation at 13.  Overall think she is just having some pain at her surgical site.  But there is no drainage or redness or swelling.  Given reassurance and will have her follow-up with her urologist.   Virgina Norfolk, DO 03/05/23 1603

## 2023-03-05 NOTE — ED Triage Notes (Signed)
Left hip surgical incision possible infection per pt . Surgery 11/8. Obvious distress . Pain 10/10

## 2023-03-06 ENCOUNTER — Encounter: Payer: Self-pay | Admitting: General Surgery

## 2023-03-06 ENCOUNTER — Ambulatory Visit: Payer: BC Managed Care – PPO | Admitting: General Surgery

## 2023-03-06 VITALS — BP 98/65 | HR 74 | Temp 98.4°F | Resp 16 | Ht 64.0 in | Wt 86.0 lb

## 2023-03-06 DIAGNOSIS — Z934 Other artificial openings of gastrointestinal tract status: Secondary | ICD-10-CM | POA: Diagnosis not present

## 2023-03-06 DIAGNOSIS — Z931 Gastrostomy status: Secondary | ICD-10-CM | POA: Insufficient documentation

## 2023-03-06 NOTE — Progress Notes (Unsigned)
[facility] History and Physical  Reason for Referral:*** Referring Physician: ***  Chief Complaint   New Patient (Initial Visit)     ROSELENA REYMAN is a 47 y.o. female.  HPI: ***.  The *** started *** and has had a duration of ***.  It is associated with ***.  The *** is improved with ***, and is made worse with ***.    Quality*** Context***  Past Medical History:  Diagnosis Date  . Anal fissure 03/11/2009  . Anemia   . Anxiety and depression   . ARTHRITIS 08/31/2007  . Asthma   . 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 Bipolar 1 disorder (HCC)   . Bowel obstruction (HCC)   . Breakdown of urinary electronic stimulator device, init (HCC)   . BRONCHITIS, RECURRENT 08/23/2009   Asthmatic Bronchitis-Dr. Sherene Sires.....-HFA 75% 12/04/2008>75% 02/05/2009>75% 08/04/2009 -PFT's 01/04/2009 2.56 (86%) ratio 75, no resp to B2 and DLC0 67% > 80 after correction   . Cancer (HCC)    cervical cancer  . Chronic interstitial cystitis 03/11/2009  . Chronic nausea   . Chronic pain   . COLONIC POLYPS, HX OF 07/25/2006   ADENOMATOUS POLYP  . COPD (chronic obstructive pulmonary disease) (HCC)   . Endometriosis   . FIBROMYALGIA 08/31/2007  . GERD 02/05/2009  . History of kidney stones   . Hyperlipidemia   . HYPERTENSION 08/31/2007  . IBS 03/11/2009  . Internal hemorrhoids   . Migraine headache   . Pancreatitis   . PONV (postoperative nausea and vomiting)   . RECTAL BLEEDING 03/11/2009  . Sciatica    right leg  . Seizures (HCC)    been about 1 year since last seisure per pt  . Substance abuse (HCC)   . Thyroid disease   . Uterine cyst     Past Surgical History:  Procedure Laterality Date  . ABDOMINAL HYSTERECTOMY    . BIOPSY  10/25/2018   Procedure: BIOPSY;  Surgeon: Malissa Hippo, MD;  Location: [facility];  Service: Endoscopy;;  gastric  . BIOPSY  06/11/2020   Procedure: BIOPSY;  Surgeon: Dolores Frame, MD;  Location: [facility];  Service:  Gastroenterology;;  small bowel  . BIOPSY  10/26/2022   Procedure: BIOPSY;  Surgeon: Franky Macho, MD;  Location: [facility];  Service: Endoscopy;;  . bladder stretching x6    . BLADDER SURGERY     stimulator placed and stretching   . CHOLECYSTECTOMY    . COLONOSCOPY    . COLONOSCOPY WITH PROPOFOL N/A 09/03/2014   Procedure: COLONOSCOPY WITH PROPOFOL;  Surgeon: Rachael Fee, MD;  Location: [facility];  Service: Endoscopy;  Laterality: N/A;  . COLONOSCOPY WITH PROPOFOL N/A 05/20/2021   Procedure: COLONOSCOPY WITH PROPOFOL;  Surgeon: Dolores Frame, MD;  Location: [facility];  Service: Gastroenterology;  Laterality: N/A;  10:10 / Asa 2 pt states she has had a hysterectomy  . ESOPHAGOGASTRODUODENOSCOPY (EGD) WITH PROPOFOL N/A 10/25/2018   Procedure: ESOPHAGOGASTRODUODENOSCOPY (EGD) WITH PROPOFOL;  Surgeon: Malissa Hippo, MD;  Location: [facility];  Service: Endoscopy;  Laterality: N/A;  11:15  . ESOPHAGOGASTRODUODENOSCOPY (EGD) WITH PROPOFOL N/A 06/11/2020   Procedure: ESOPHAGOGASTRODUODENOSCOPY (EGD) WITH PROPOFOL;  Surgeon: Dolores Frame, MD;  [facility]: [facility];  Service: Gastroenterology;  Laterality: N/A;  . ESOPHAGOGASTRODUODENOSCOPY (EGD) WITH PROPOFOL N/A 10/26/2022   Procedure: ESOPHAGOGASTRODUODENOSCOPY (EGD) WITH PROPOFOL;  Surgeon: Franky Macho, MD;  Location: [facility];  Service: Endoscopy;  Laterality: N/A;  9:00am;asa 3  .  interstitial cystitis    . IR GASTROSTOMY TUBE MOD SED  01/27/2021  . PACEMAKER INSERTION     in hip for interstitial cystitis  . pacemaker removal    . PEG TUBE PLACEMENT    . POLYPECTOMY  05/20/2021   Procedure: POLYPECTOMY;  Surgeon: Marguerita Merles, Reuel Boom, MD;  Location: [facility];  Service: Gastroenterology;;  . removal of uterine cyst and scrapped uterus    . replaced bladder pacemaker      Family History  Problem Relation Age of Onset  . Heart disease Father   . Asthma Maternal  Grandmother   . Emphysema Maternal Grandfather   . Cancer Maternal Grandfather        Lung Cancer  . Cancer Other        Lung Cancer-Aunt  . Colon cancer Neg Hx   . Esophageal cancer Neg Hx   . Rectal cancer Neg Hx   . Stomach cancer Neg Hx   . Thyroid disease Neg Hx     Social History   Tobacco Use  . Smoking status: Every Day    Current packs/day: 1.00    Average packs/day: 1 pack/day for 14.0 years (14.0 ttl pk-yrs)    Types: Cigarettes  . Smokeless tobacco: Never  Vaping Use  . Vaping status: Former  Substance Use Topics  . Alcohol use: Not Currently    Comment: last drink 3 weeks ago; recently released from rehab  . Drug use: Yes    Types: Marijuana    Comment: once a month    Medications: {medication reviewed/display:3041432} Allergies as of 03/06/2023       Reactions   Abilify [aripiprazole] Swelling, Palpitations, Other (See Comments)   Throat swelling, tremors   Amitriptyline Anaphylaxis   Angioedema   Metoclopramide Hcl Other (See Comments)   Causes seizures   Propoxyphene Rash   Toradol [ketorolac Tromethamine]    Tramadol Swelling, Other (See Comments), Rash   Throat swelling, tremors   Ambien [zolpidem Tartrate] Other (See Comments)   hallucinations   Eszopiclone Other (See Comments)   Hallucinations, hyperactivity, and bad taste in mouth   Fentanyl Itching   Itching, tremors, tachycardia.   Nicoderm [nicotine]    Varenicline Other (See Comments)   Suicidal thoughts   Buprenorphine Hcl Itching, Hives   Demerol [meperidine] Rash   Emetrol Hives, Itching, Rash   Morphine And Codeine Hives, Itching   Patient states she is able to tolerate Dilaudid.        Medication List        Accurate as of March 06, 2023 10:20 AM. If you have any questions, ask your nurse or doctor.          acetaminophen 500 MG tablet Commonly known as: TYLENOL Take 1,000 mg by mouth every 6 (six) hours as needed for mild pain, fever or headache.    albuterol 108 (90 Base) MCG/ACT inhaler Commonly known as: ProAir HFA INHALE 2 PUFFS EVERY 6 HOURS AS NEEDED FOR SHORTNESS OF BREATH AND WHEEZING.   atomoxetine 10 MG capsule Commonly known as: STRATTERA Take 10 mg by mouth daily.   budesonide-formoterol 80-4.5 MCG/ACT inhaler Commonly known as: Symbicort Inhale 2 puffs into the lungs 2 (two) times daily.   busPIRone 15 MG tablet Commonly known as: BUSPAR Take 1 tablet (15 mg total) by mouth 3 (three) times daily.   clonazePAM 0.5 MG tablet Commonly known as: KLONOPIN Take 1 tablet (0.5 mg total) by mouth 3 (three) times daily as needed for anxiety.  What changed: when to take this   cyanocobalamin 1000 MCG tablet Commonly known as: VITAMIN B12 Take 1,000 mcg by mouth daily.   estradiol 0.1 MG/GM vaginal cream Commonly known as: ESTRACE Place 1 Applicatorful vaginally at bedtime.   fluticasone 50 MCG/ACT nasal spray Commonly known as: FLONASE Place 2 sprays into both nostrils daily. What changed:  when to take this reasons to take this   gabapentin 800 MG tablet Commonly known as: NEURONTIN Take 0.5 tablets (400 mg total) by mouth in the morning, at noon, in the evening, and at bedtime.   HYDROcodone-acetaminophen 7.5-325 MG tablet Commonly known as: NORCO Take 1 tablet by mouth every 6 (six) hours as needed for moderate pain (pain score 4-6).   hydrOXYzine 50 MG tablet Commonly known as: ATARAX Take 50 mg by mouth at bedtime.   lipase/protease/amylase 09604 UNITS Cpep capsule Commonly known as: CREON Take 1 capsule (36,000 Units total) by mouth 4 (four) times daily -  with meals and at bedtime.   Magnesium 250 MG Tabs Take 250 mg by mouth daily.   metoprolol tartrate 25 MG tablet Commonly known as: LOPRESSOR Take 0.5 tablets (12.5 mg total) by mouth 2 (two) times daily. What changed: how much to take   OLANZapine 5 MG tablet Commonly known as: ZYPREXA Take 1 tablet (5 mg total) by mouth daily.    omeprazole 40 MG capsule Commonly known as: PRILOSEC Take 1 capsule (40 mg total) by mouth 2 (two) times daily.   ondansetron 4 MG tablet Commonly known as: ZOFRAN Take 1 tablet (4 mg total) by mouth every 8 (eight) hours as needed for nausea or vomiting.   oxybutynin 15 MG 24 hr tablet Commonly known as: DITROPAN XL Take 15 mg by mouth daily.   Oxycodone HCl 10 MG Tabs Take 1-1.5 tablets (10-15 mg total) by mouth every 6 (six) hours as needed for breakthrough pain.   promethazine 12.5 MG tablet Commonly known as: PHENERGAN Take 2 tablets (25 mg total) by mouth every 6 (six) hours as needed for nausea or vomiting.   QUEtiapine 400 MG tablet Commonly known as: SEROQUEL Take 800 mg by mouth at bedtime.   Restasis 0.05 % ophthalmic emulsion Generic drug: cycloSPORINE Place 1 drop into both eyes 2 (two) times daily.   traZODone 150 MG tablet Commonly known as: DESYREL Take 150 mg by mouth at bedtime.         ROS:  {Review of Systems:30496}  Blood pressure 98/65, pulse 74, temperature 98.4 F (36.9 C), temperature source Oral, resp. rate 16, height 5\' 4"  (1.626 m), weight 86 lb (39 kg), SpO2 98%. Physical Exam  Results: Results for orders placed or performed during the hospital encounter of 03/05/23 (from the past 48 hour(s))  Lactic acid, plasma     Status: None   Collection Time: 03/05/23  1:18 PM  Result Value Ref Range   Lactic Acid, Venous 0.8 0.5 - 1.9 mmol/L    Comment: Performed at [facility], [address] [place], [place] [address]  Comprehensive metabolic panel     Status: Abnormal   Collection Time: 03/05/23  1:18 PM  Result Value Ref Range   Sodium 138 135 - 145 mmol/L   Potassium 3.3 (L) 3.5 - 5.1 mmol/L   Chloride 104 98 - 111 mmol/L   CO2 27 22 - 32 mmol/L   Glucose, Bld 85 70 - 99 mg/dL    Comment: Glucose reference range applies only to samples taken after fasting for  at least 8 hours.   BUN <5 (L) 6 - 20 mg/dL     Comment: REPEATED TO VERIFY   Creatinine, Ser 0.70 0.44 - 1.00 mg/dL   Calcium 9.2 8.9 - 95.2 mg/dL   Total Protein 6.7 6.5 - 8.1 g/dL   Albumin 3.9 3.5 - 5.0 g/dL   AST 17 15 - 41 U/L   ALT 6 0 - 44 U/L   Alkaline Phosphatase 85 38 - 126 U/L   Total Bilirubin 0.3 <1.2 mg/dL   GFR, Estimated >84 >13 mL/min    Comment: (NOTE) Calculated using the CKD-EPI Creatinine Equation (2021)    Anion gap 7 5 - 15    Comment: Performed at [facility], [address], [place], [place] [address]  CBC with Differential     Status: Abnormal   Collection Time: 03/05/23  1:18 PM  Result Value Ref Range   WBC 13.2 (H) 4.0 - 10.5 K/uL   RBC 4.01 3.87 - 5.11 MIL/uL   Hemoglobin 12.4 12.0 - 15.0 g/dL   HCT 02.7 25.3 - 66.4 %   MCV 92.3 80.0 - 100.0 fL   MCH 30.9 26.0 - 34.0 pg   MCHC 33.5 30.0 - 36.0 g/dL   RDW 40.3 47.4 - 25.9 %   Platelets 427 (H) 150 - 400 K/uL   nRBC 0.0 0.0 - 0.2 %   Neutrophils Relative % 48 %    Comment: REPEATED TO VERIFY   Neutro Abs 6.3 1.7 - 7.7 K/uL   Lymphocytes Relative 45 %   Lymphs Abs 5.9 (H) 0.7 - 4.0 K/uL   Monocytes Relative 5 %   Monocytes Absolute 0.7 0.1 - 1.0 K/uL   Eosinophils Relative 1 %   Eosinophils Absolute 0.2 0.0 - 0.5 K/uL   Basophils Relative 1 %   Basophils Absolute 0.1 0.0 - 0.1 K/uL   RBC Morphology MORPHOLOGY UNREMARKABLE    Smear Review MORPHOLOGY UNREMARKABLE    Immature Granulocytes 0 %   Abs Immature Granulocytes 0.04 0.00 - 0.07 K/uL   Reactive, Benign Lymphocytes PRESENT    Hypersegmented Neutrophils PRESENT     Comment: Performed at [facility], [address] [place], [place] [address]    No results found.   Assessment & Plan:  AKILA HAYASHI is a 47 y.o. female with *** -*** -*** -Follow up ***  All questions were answered to the satisfaction of the patient and family***.  The risk and benefits of *** were discussed including but not limited to ***.  After careful  consideration, Damian R Waugaman has decided to ***.    Lucretia Roers 03/06/2023, 10:20 AM

## 2023-03-06 NOTE — Patient Instructions (Signed)
Keep bandage in place for 48 hours. Expect some drainage. The hole will close up on its on in next 48-72 hours.

## 2023-03-21 ENCOUNTER — Other Ambulatory Visit: Payer: Self-pay

## 2023-03-21 ENCOUNTER — Emergency Department (HOSPITAL_BASED_OUTPATIENT_CLINIC_OR_DEPARTMENT_OTHER): Payer: BC Managed Care – PPO | Admitting: Radiology

## 2023-03-21 ENCOUNTER — Emergency Department (HOSPITAL_BASED_OUTPATIENT_CLINIC_OR_DEPARTMENT_OTHER): Payer: BC Managed Care – PPO

## 2023-03-21 ENCOUNTER — Encounter (HOSPITAL_BASED_OUTPATIENT_CLINIC_OR_DEPARTMENT_OTHER): Payer: Self-pay | Admitting: Emergency Medicine

## 2023-03-21 ENCOUNTER — Emergency Department (HOSPITAL_BASED_OUTPATIENT_CLINIC_OR_DEPARTMENT_OTHER)
Admission: EM | Admit: 2023-03-21 | Discharge: 2023-03-21 | Disposition: A | Discharge status: Home / Self Care | Payer: BC Managed Care – PPO | Attending: Emergency Medicine | Admitting: Emergency Medicine

## 2023-03-21 DIAGNOSIS — Z1152 Encounter for screening for COVID-19: Secondary | ICD-10-CM | POA: Diagnosis not present

## 2023-03-21 DIAGNOSIS — R059 Cough, unspecified: Secondary | ICD-10-CM | POA: Diagnosis present

## 2023-03-21 DIAGNOSIS — J209 Acute bronchitis, unspecified: Secondary | ICD-10-CM

## 2023-03-21 DIAGNOSIS — R062 Wheezing: Secondary | ICD-10-CM

## 2023-03-21 DIAGNOSIS — J189 Pneumonia, unspecified organism: Secondary | ICD-10-CM | POA: Diagnosis not present

## 2023-03-21 LAB — CBC WITH DIFFERENTIAL/PLATELET
Abs Immature Granulocytes: 0.05 10*3/uL (ref 0.00–0.07)
Basophils Absolute: 0 10*3/uL (ref 0.0–0.1)
Basophils Relative: 0 %
Eosinophils Absolute: 0 10*3/uL (ref 0.0–0.5)
Eosinophils Relative: 0 %
HCT: 37.7 % (ref 36.0–46.0)
Hemoglobin: 12.6 g/dL (ref 12.0–15.0)
Immature Granulocytes: 1 %
Lymphocytes Relative: 35 %
Lymphs Abs: 3.8 10*3/uL (ref 0.7–4.0)
MCH: 30.1 pg (ref 26.0–34.0)
MCHC: 33.4 g/dL (ref 30.0–36.0)
MCV: 90 fL (ref 80.0–100.0)
Monocytes Absolute: 0.6 10*3/uL (ref 0.1–1.0)
Monocytes Relative: 5 %
Neutro Abs: 6.5 10*3/uL (ref 1.7–7.7)
Neutrophils Relative %: 59 %
Platelets: 303 10*3/uL (ref 150–400)
RBC: 4.19 MIL/uL (ref 3.87–5.11)
RDW: 12.6 % (ref 11.5–15.5)
WBC: 11 10*3/uL — ABNORMAL HIGH (ref 4.0–10.5)
nRBC: 0 % (ref 0.0–0.2)

## 2023-03-21 LAB — COMPREHENSIVE METABOLIC PANEL
ALT: 10 U/L (ref 0–44)
AST: 14 U/L — ABNORMAL LOW (ref 15–41)
Albumin: 3.8 g/dL (ref 3.5–5.0)
Alkaline Phosphatase: 88 U/L (ref 38–126)
Anion gap: 7 (ref 5–15)
BUN: <5 mg/dL — ABNORMAL LOW (ref 6–20)
CO2: 26 mmol/L (ref 22–32)
Calcium: 8.6 mg/dL — ABNORMAL LOW (ref 8.9–10.3)
Chloride: 103 mmol/L (ref 98–111)
Creatinine, Ser: 0.65 mg/dL (ref 0.44–1.00)
GFR, Estimated: >60 mL/min (ref >60)
Glucose, Bld: 107 mg/dL — ABNORMAL HIGH (ref 70–99)
Potassium: 3.1 mmol/L — ABNORMAL LOW (ref 3.5–5.1)
Sodium: 136 mmol/L (ref 135–145)
Total Bilirubin: 0.2 mg/dL (ref <1.2)
Total Protein: 7.3 g/dL (ref 6.5–8.1)

## 2023-03-21 LAB — RESP PANEL BY RT-PCR (RSV, FLU A&B, COVID)  RVPGX2
Influenza A by PCR: NEGATIVE
Influenza B by PCR: NEGATIVE
Resp Syncytial Virus by PCR: NEGATIVE
SARS Coronavirus 2 by RT PCR: NEGATIVE

## 2023-03-21 LAB — TROPONIN I (HIGH SENSITIVITY): Troponin I (High Sensitivity): <2 ng/L (ref <18)

## 2023-03-21 LAB — LACTIC ACID, PLASMA: Lactic Acid, Venous: 0.8 mmol/L (ref 0.5–1.9)

## 2023-03-21 MED ORDER — SODIUM CHLORIDE 0.9 % IV BOLUS
1000.0000 mL | Freq: Once | INTRAVENOUS | Status: AC
  Administered 2023-03-21: 1000 mL via INTRAVENOUS

## 2023-03-21 MED ORDER — AMOXICILLIN-POT CLAVULANATE 875-125 MG PO TABS: 1.0000 | ORAL_TABLET | Freq: Two times a day (BID) | ORAL | 0 refills | Status: DC

## 2023-03-21 MED ORDER — ALBUTEROL SULFATE HFA 108 (90 BASE) MCG/ACT IN AERS: INHALATION_SPRAY | RESPIRATORY_TRACT | 1 refills | Status: DC

## 2023-03-21 MED ORDER — AZITHROMYCIN 250 MG PO TABS: 250.0000 mg | ORAL_TABLET | Freq: Every day | ORAL | 0 refills | Status: DC

## 2023-03-21 MED ORDER — SODIUM CHLORIDE 0.9 % IV SOLN
1.0000 g | Freq: Once | INTRAVENOUS | Status: AC
  Administered 2023-03-21: 1 g via INTRAVENOUS
  Filled 2023-03-21: qty 10

## 2023-03-21 MED ORDER — AZITHROMYCIN 250 MG PO TABS
500.0000 mg | ORAL_TABLET | ORAL | Status: AC
  Administered 2023-03-21: 500 mg via ORAL
  Filled 2023-03-21: qty 2

## 2023-03-21 MED ORDER — PREDNISONE 20 MG PO TABS: 40.0000 mg | ORAL_TABLET | Freq: Every day | ORAL | 0 refills | Status: AC

## 2023-03-21 MED ORDER — IOHEXOL 350 MG/ML SOLN
100.0000 mL | Freq: Once | INTRAVENOUS | Status: AC | PRN
  Administered 2023-03-21: 75 mL via INTRAVENOUS

## 2023-03-21 MED ORDER — IPRATROPIUM-ALBUTEROL 0.5-2.5 (3) MG/3ML IN SOLN: 3.0000 mL | RESPIRATORY_TRACT | Status: DC

## 2023-03-21 NOTE — ED Notes (Signed)
Patient transported to X-ray 

## 2023-03-21 NOTE — ED Triage Notes (Addendum)
Pt notably SOB with cough x 3 weeks. Was sent from PCP. She had a fever in the dr office and was given tylenol.

## 2023-03-21 NOTE — ED Provider Notes (Signed)
[place] EMERGENCY DEPARTMENT AT [facility] Provider Note   CSN: [id number] Arrival date & time: 03/21/23  1700     History {Add pertinent medical, surgical, social history, OB history to HPI:1} Chief Complaint  Patient presents with   Shortness of Breath    [name] is a 47 y.o. female.  47 year old female with a history of chronic weight loss and failure to thrive, homelessness, COPD, and current tobacco use who presents to the emergency department with cough and fever.  For the past 2 to 3 weeks has been having a productive cough.  Says that in the past few days it has gotten significantly worse.  Now is having generalized fatigue.  Passed out 1 time because of her tiredness.  Has been having fevers.  Having shortness of breath and some left-sided chest pressure.  Went to [facility] today and was found to have a temperature of 104F and heart rate in the 150s.  Was given Tylenol just prior to coming to the emergency department.  They are concerned that she has a pneumonia.  Patient denies any history of IV drug use.  Does still smoke.  Does have a history of cervical cancer and is on an estrogen patch and cream.       Home Medications Prior to Admission medications   Medication Sig Start Date End Date Taking? Authorizing Provider  acetaminophen (TYLENOL) 500 MG tablet Take 1,000 mg by mouth every 6 (six) hours as needed for mild pain, fever or headache.    [provider]  albuterol (PROAIR HFA) 108 (90 Base) MCG/ACT inhaler INHALE 2 PUFFS EVERY 6 HOURS AS NEEDED FOR SHORTNESS OF BREATH AND WHEEZING. 02/27/20   Zola Button, Grayling Congress, DO  atomoxetine (STRATTERA) 10 MG capsule Take 10 mg by mouth daily.    [provider]  budesonide-formoterol (SYMBICORT) 80-4.5 MCG/ACT inhaler Inhale 2 puffs into the lungs 2 (two) times daily. 10/20/22 03/06/23  Saguier, Ramon Dredge, PA-C  busPIRone (BUSPAR) 15 MG tablet Take 1 tablet (15 mg total) by mouth 3  (three) times daily. 09/13/22   Maczis, Elmer Sow, PA-C  clonazePAM (KLONOPIN) 0.5 MG tablet Take 1 tablet (0.5 mg total) by mouth 3 (three) times daily as needed for anxiety. Patient taking differently: Take 0.5 mg by mouth 3 (three) times daily. 05/01/19   Donato Schultz, DO  cyanocobalamin (VITAMIN B12) 1000 MCG tablet Take 1,000 mcg by mouth daily.    [provider]  estradiol (ESTRACE) 0.1 MG/GM vaginal cream Place 1 Applicatorful vaginally at bedtime. 11/21/19   [provider]  fluticasone (FLONASE) 50 MCG/ACT nasal spray Place 2 sprays into both nostrils daily. Patient taking differently: Place 2 sprays into both nostrils daily as needed for allergies. 04/22/19   Donato Schultz, DO  gabapentin (NEURONTIN) 800 MG tablet Take 0.5 tablets (400 mg total) by mouth in the morning, at noon, in the evening, and at bedtime. 02/01/21   Dorcas Carrow, MD  HYDROcodone-acetaminophen (NORCO) 7.5-325 MG tablet Take 1 tablet by mouth every 6 (six) hours as needed for moderate pain (pain score 4-6).    [provider]  hydrOXYzine (ATARAX/VISTARIL) 50 MG tablet Take 50 mg by mouth at bedtime. 08/26/18   [provider]  lipase/protease/amylase (CREON) 36000 UNITS CPEP capsule Take 1 capsule (36,000 Units total) by mouth 4 (four) times daily -  with meals and at bedtime. 04/26/21   Carlan, Jeral Pinch, NP  Magnesium 250 MG TABS Take 250  mg by mouth daily.    [provider]  metoprolol tartrate (LOPRESSOR) 25 MG tablet Take 0.5 tablets (12.5 mg total) by mouth 2 (two) times daily. Patient taking differently: Take 25 mg by mouth 2 (two) times daily. 09/13/22   Maczis, Elmer Sow, PA-C  OLANZapine (ZYPREXA) 5 MG tablet Take 1 tablet (5 mg total) by mouth daily. 09/14/22   Maczis, Elmer Sow, PA-C  omeprazole (PRILOSEC) 40 MG capsule Take 1 capsule (40 mg total) by mouth 2 (two) times daily. 02/12/23   Carlan, Chelsea L, NP  ondansetron (ZOFRAN) 4 MG tablet Take 1  tablet (4 mg total) by mouth every 8 (eight) hours as needed for nausea or vomiting. 02/14/23   Carlan, Chelsea L, NP  oxybutynin (DITROPAN XL) 15 MG 24 hr tablet Take 15 mg by mouth daily. 09/16/19   [provider]  Oxycodone HCl 10 MG TABS Take 1-1.5 tablets (10-15 mg total) by mouth every 6 (six) hours as needed for breakthrough pain. 09/13/22   Maczis, Elmer Sow, PA-C  promethazine (PHENERGAN) 12.5 MG tablet Take 2 tablets (25 mg total) by mouth every 6 (six) hours as needed for nausea or vomiting. 02/14/23   Carlan, Chelsea L, NP  QUEtiapine (SEROQUEL) 400 MG tablet Take 800 mg by mouth at bedtime. 09/16/18   [provider]  RESTASIS 0.05 % ophthalmic emulsion Place 1 drop into both eyes 2 (two) times daily. 02/26/20   [provider]  traZODone (DESYREL) 150 MG tablet Take 150 mg by mouth at bedtime.    [provider]      Allergies    Abilify [aripiprazole], Amitriptyline, Metoclopramide hcl, Propoxyphene, Toradol [ketorolac tromethamine], Tramadol, Ambien [zolpidem tartrate], Eszopiclone, Fentanyl, Nicoderm [nicotine], Varenicline, Buprenorphine hcl, Demerol [meperidine], Emetrol, and Morphine and codeine    Review of Systems   Review of Systems  Physical Exam Updated Vital Signs BP 115/83 (BP Location: Right Arm)   Pulse (!) 119   Temp 98.8 F (37.1 C)   Resp 20   Ht 5\' 4"  (1.626 m)   Wt 39 kg   SpO2 91%   BMI 14.76 kg/m  Physical Exam Vitals and nursing note reviewed.  Constitutional:      General: She is not in acute distress.    Appearance: She is well-developed.  HENT:     Head: Normocephalic and atraumatic.     Right Ear: External ear normal.     Left Ear: External ear normal.     Nose: Nose normal.  Eyes:     Extraocular Movements: Extraocular movements intact.     Conjunctiva/sclera: Conjunctivae normal.     Pupils: Pupils are equal, round, and reactive to light.  Cardiovascular:     Rate and Rhythm: Regular rhythm. Tachycardia  present.     Heart sounds: No murmur heard.    Comments: Chest pain reproducible Pulmonary:     Effort: Pulmonary effort is normal. No respiratory distress.     Breath sounds: Wheezing present.  Musculoskeletal:     Cervical back: Normal range of motion and neck supple.     Right lower leg: No edema.     Left lower leg: No edema.  Skin:    General: Skin is warm and dry.  Neurological:     Mental Status: She is alert and oriented to person, place, and time. Mental status is at baseline.  Psychiatric:        Mood and Affect: Mood normal.     ED Results / Procedures /  Treatments   Labs (all labs ordered are listed, but only abnormal results are displayed) Labs Reviewed  LACTIC ACID, PLASMA  LACTIC ACID, PLASMA  COMPREHENSIVE METABOLIC PANEL  CBC WITH DIFFERENTIAL/PLATELET  URINALYSIS, W/ REFLEX TO CULTURE (INFECTION SUSPECTED)    EKG None  Radiology No results found.  Procedures Procedures  {Document cardiac monitor, telemetry assessment procedure when appropriate:1}  Medications Ordered in ED Medications - No data to display  ED Course/ Medical Decision Making/ A&P   {   Click here for ABCD2, HEART and other calculatorsREFRESH Note before signing :1}                              Medical Decision Making Amount and/or Complexity of Data Reviewed Labs: ordered. Radiology: ordered.  Risk Prescription drug management.   ***  {Document critical care time when appropriate:1} {Document review of labs and clinical decision tools ie heart score, Chads2Vasc2 etc:1}  {Document your independent review of radiology images, and any outside records:1} {Document your discussion with family members, caretakers, and with consultants:1} {Document social determinants of health affecting pt's care:1} {Document your decision making why or why not admission, treatments were needed:1} Final Clinical Impression(s) / ED Diagnoses Final diagnoses:  None    Rx / DC Orders ED  Discharge Orders     None

## 2023-03-21 NOTE — Discharge Instructions (Signed)
You were seen for your pneumonia in the emergency department.   At home, please take the antibiotics we have prescribed you.  Please also take the prednisone and use your inhaler for your wheezing.    Check your MyChart online for the results of any tests that had not resulted by the time you left the emergency department.   Follow-up with your primary doctor in 2-3 days regarding your visit.    Return immediately to the emergency department if you experience any of the following: Difficulty breathing, or any other concerning symptoms.    Thank you for visiting our Emergency Department. It was a pleasure taking care of you today.

## 2023-05-27 ENCOUNTER — Emergency Department (HOSPITAL_BASED_OUTPATIENT_CLINIC_OR_DEPARTMENT_OTHER): Payer: MEDICAID

## 2023-05-27 ENCOUNTER — Other Ambulatory Visit: Payer: Self-pay

## 2023-05-27 ENCOUNTER — Emergency Department (HOSPITAL_BASED_OUTPATIENT_CLINIC_OR_DEPARTMENT_OTHER)
Admission: EM | Admit: 2023-05-27 | Discharge: 2023-05-27 | Disposition: A | Discharge status: Home / Self Care | Payer: MEDICAID | Attending: Emergency Medicine | Admitting: Emergency Medicine

## 2023-05-27 ENCOUNTER — Encounter (HOSPITAL_BASED_OUTPATIENT_CLINIC_OR_DEPARTMENT_OTHER): Payer: Self-pay | Admitting: Urology

## 2023-05-27 DIAGNOSIS — Y9372 Activity, wrestling: Secondary | ICD-10-CM | POA: Insufficient documentation

## 2023-05-27 DIAGNOSIS — S299XXA Unspecified injury of thorax, initial encounter: Secondary | ICD-10-CM | POA: Insufficient documentation

## 2023-05-27 DIAGNOSIS — W2209XA Striking against other stationary object, initial encounter: Secondary | ICD-10-CM | POA: Insufficient documentation

## 2023-05-27 MED ORDER — HYDROMORPHONE HCL 1 MG/ML IJ SOLN
1.0000 mg | Freq: Once | INTRAMUSCULAR | Status: AC
  Administered 2023-05-27: 1 mg via INTRAMUSCULAR
  Filled 2023-05-27: qty 1

## 2023-05-27 MED ORDER — LIDOCAINE 5 % EX PTCH: 1.0000 | MEDICATED_PATCH | CUTANEOUS | 0 refills | Status: DC

## 2023-05-27 MED ORDER — LIDOCAINE 5 % EX PTCH
1.0000 | MEDICATED_PATCH | CUTANEOUS | Status: DC
  Administered 2023-05-27: 1 via TRANSDERMAL
  Filled 2023-05-27: qty 1

## 2023-05-27 MED ORDER — HYDROMORPHONE HCL 1 MG/ML IJ SOLN
0.5000 mg | Freq: Once | INTRAMUSCULAR | Status: AC
  Administered 2023-05-27: 0.5 mg via INTRAVENOUS
  Filled 2023-05-27: qty 1

## 2023-05-27 NOTE — ED Notes (Signed)
RT Note: Patient has incentive spirometer in room but patient is refusing to use because she states she is hurting too much. She says she can't take a deep breath right now.

## 2023-05-27 NOTE — ED Notes (Signed)
Pt has called out multiple times saying her pain has not improved; now states she is having trouble breathing; O2 97% on RA, skin color and temp WDL; pt is speaking in complete sentences; will continue to monitor

## 2023-05-27 NOTE — Discharge Instructions (Signed)
You were evaluated in the emergency room for rib injury.  Your x-ray did not show any fracture or pneumonia.  I suspect that you bruised or strained the area.  Prescription for lidocaine patches was sent to the pharmacy.  If you experience any new or worsening symptoms please return to the emergency room.

## 2023-05-27 NOTE — ED Notes (Signed)
Pt sts daughter was "play wrestling" with her and threw her against a wooden beam 3d ago

## 2023-05-27 NOTE — ED Triage Notes (Signed)
Pt states left side rib injury 3 days ago, was thrown onto floor and hit back/ribs on wooden beam  States pain with breathing  On 2nd round of  anitbiotics for UTI x 1 week

## 2023-05-27 NOTE — ED Provider Notes (Signed)
[place] EMERGENCY DEPARTMENT AT [facility] Provider Note   CSN: [id number] Arrival date & time: 05/27/23  1126     History  Chief Complaint  Patient presents with   Rib Injury    [name] is a 48 y.o. female who presents with left rib pain.  Patient states she was wrestling with her daughter and slammed into a wooden beam.  Patient describes pain with deep breathing.  Reports that she did have pneumonia a month ago of which she was treated for.  Reports that she has had intermittent cough since.  No fevers.  She is currently being treated with antibiotics for UTI.  HPI     Home Medications Prior to Admission medications   Medication Sig Start Date End Date Taking? Authorizing Provider  lidocaine (LIDODERM) 5 % Place 1 patch onto the skin daily. Remove & Discard patch within 12 hours or as directed by MD 05/27/23  Yes Halford Decamp, PA-C  acetaminophen (TYLENOL) 500 MG tablet Take 1,000 mg by mouth every 6 (six) hours as needed for mild pain, fever or headache.    [provider]  albuterol (PROAIR HFA) 108 (90 Base) MCG/ACT inhaler INHALE 2 PUFFS EVERY 6 HOURS AS NEEDED FOR SHORTNESS OF BREATH AND WHEEZING. 03/21/23   Rondel Baton, MD  amoxicillin-clavulanate (AUGMENTIN) 875-125 MG tablet Take 1 tablet by mouth every 12 (twelve) hours. 03/21/23   Rondel Baton, MD  atomoxetine (STRATTERA) 10 MG capsule Take 10 mg by mouth daily.    [provider]  azithromycin (ZITHROMAX) 250 MG tablet Take 1 tablet (250 mg total) by mouth daily. Take first 2 tablets together, then 1 every day until finished. 03/21/23   Rondel Baton, MD  budesonide-formoterol [facility]) 80-4.5 MCG/ACT inhaler Inhale 2 puffs into the lungs 2 (two) times daily. 10/20/22 03/06/23  Saguier, Ramon Dredge, PA-C  busPIRone (BUSPAR) 15 MG tablet Take 1 tablet (15 mg total) by mouth 3 (three) times daily. 09/13/22   Maczis, Elmer Sow, PA-C  clonazePAM (KLONOPIN) 0.5 MG tablet  Take 1 tablet (0.5 mg total) by mouth 3 (three) times daily as needed for anxiety. Patient taking differently: Take 0.5 mg by mouth 3 (three) times daily. 05/01/19   Donato Schultz, DO  cyanocobalamin (VITAMIN B12) 1000 MCG tablet Take 1,000 mcg by mouth daily.    [provider]  estradiol (ESTRACE) 0.1 MG/GM vaginal cream Place 1 Applicatorful vaginally at bedtime. 11/21/19   [provider]  fluticasone (FLONASE) 50 MCG/ACT nasal spray Place 2 sprays into both nostrils daily. Patient taking differently: Place 2 sprays into both nostrils daily as needed for allergies. 04/22/19   Donato Schultz, DO  gabapentin (NEURONTIN) 800 MG tablet Take 0.5 tablets (400 mg total) by mouth in the morning, at noon, in the evening, and at bedtime. 02/01/21   Dorcas Carrow, MD  HYDROcodone-acetaminophen (NORCO) 7.5-325 MG tablet Take 1 tablet by mouth every 6 (six) hours as needed for moderate pain (pain score 4-6).    [provider]  hydrOXYzine (ATARAX/VISTARIL) 50 MG tablet Take 50 mg by mouth at bedtime. 08/26/18   [provider]  lipase/protease/amylase (CREON) 36000 UNITS CPEP capsule Take 1 capsule (36,000 Units total) by mouth 4 (four) times daily -  with meals and at bedtime. 04/26/21   Carlan, Chelsea L, NP  Magnesium 250 MG TABS Take 250 mg by mouth daily.    [provider]  metoprolol tartrate (LOPRESSOR) 25 MG tablet  Take 0.5 tablets (12.5 mg total) by mouth 2 (two) times daily. Patient taking differently: Take 25 mg by mouth 2 (two) times daily. 09/13/22   Maczis, Elmer Sow, PA-C  OLANZapine (ZYPREXA) 5 MG tablet Take 1 tablet (5 mg total) by mouth daily. 09/14/22   Maczis, Elmer Sow, PA-C  omeprazole (PRILOSEC) 40 MG capsule Take 1 capsule (40 mg total) by mouth 2 (two) times daily. 02/12/23   Carlan, Chelsea L, NP  ondansetron (ZOFRAN) 4 MG tablet Take 1 tablet (4 mg total) by mouth every 8 (eight) hours as needed for nausea or vomiting. 02/14/23    Carlan, Chelsea L, NP  oxybutynin (DITROPAN XL) 15 MG 24 hr tablet Take 15 mg by mouth daily. 09/16/19   [provider]  Oxycodone HCl 10 MG TABS Take 1-1.5 tablets (10-15 mg total) by mouth every 6 (six) hours as needed for breakthrough pain. 09/13/22   Maczis, Elmer Sow, PA-C  promethazine (PHENERGAN) 12.5 MG tablet Take 2 tablets (25 mg total) by mouth every 6 (six) hours as needed for nausea or vomiting. 02/14/23   Carlan, Chelsea L, NP  QUEtiapine (SEROQUEL) 400 MG tablet Take 800 mg by mouth at bedtime. 09/16/18   [provider]  RESTASIS 0.05 % ophthalmic emulsion Place 1 drop into both eyes 2 (two) times daily. 02/26/20   [provider]  traZODone (DESYREL) 150 MG tablet Take 150 mg by mouth at bedtime.    [provider]      Allergies    Abilify [aripiprazole], Amitriptyline, Metoclopramide hcl, Propoxyphene, Toradol [ketorolac tromethamine], Tramadol, Ambien [zolpidem tartrate], Eszopiclone, Fentanyl, Nicoderm [nicotine], Varenicline, Buprenorphine hcl, Demerol [meperidine], Emetrol, and Morphine and codeine    Review of Systems   Review of Systems  Musculoskeletal:  Positive for myalgias.    Physical Exam Updated Vital Signs BP 107/78   Pulse 97   Temp 98.9 F (37.2 C) (Oral)   Resp 20   Ht 5\' 4"  (1.626 m)   Wt 39 kg   SpO2 98%   BMI 14.76 kg/m  Physical Exam Vitals and nursing note reviewed.  Constitutional:      General: She is in acute distress.     Appearance: She is well-developed.  HENT:     Head: Normocephalic and atraumatic.  Eyes:     Conjunctiva/sclera: Conjunctivae normal.  Cardiovascular:     Rate and Rhythm: Normal rate and regular rhythm.     Heart sounds: No murmur heard. Pulmonary:     Effort: Pulmonary effort is normal. No respiratory distress.     Breath sounds: Normal breath sounds.  Abdominal:     Palpations: Abdomen is soft.     Tenderness: There is no abdominal tenderness.  Musculoskeletal:         General: No swelling.     Cervical back: Neck supple.     Comments: Patient has extreme hyper sensitive tenderness to left ribs, no step-offs or gross deformities, no significant ecchymosis or swelling, she is writhing in pain  Skin:    General: Skin is warm and dry.     Capillary Refill: Capillary refill takes less than 2 seconds.  Neurological:     Mental Status: She is alert.  Psychiatric:        Mood and Affect: Mood normal.     ED Results / Procedures / Treatments   Labs (all labs ordered are listed, but only abnormal results are displayed) Labs Reviewed - No data to display  EKG None  Radiology  DG Ribs Unilateral W/Chest Left Result Date: 05/27/2023 CLINICAL DATA:  48 year old female with history of chest pain. Left-sided rib injury 3 days ago. EXAM: LEFT RIBS AND CHEST - 3+ VIEW COMPARISON:  Chest x-ray 03/21/2023. FINDINGS: Lung volumes are normal. No consolidative airspace disease. No pleural effusions. No pneumothorax. No pulmonary nodule or mass noted. Pulmonary vasculature and the cardiomediastinal silhouette are within normal limits. Dedicated views of the left ribs demonstrate no definite acute displaced left-sided rib fractures. IMPRESSION: 1. No acute displaced left-sided rib fractures. 2. No radiographic evidence of acute cardiopulmonary disease. Electronically Signed   By: Trudie Reed M.D.   On: 05/27/2023 12:57    Procedures Procedures    Medications Ordered in ED Medications  lidocaine (LIDODERM) 5 % 1 patch (1 patch Transdermal Patch Applied 05/27/23 1257)  HYDROmorphone (DILAUDID) injection 1 mg (1 mg Intramuscular Given 05/27/23 1153)  HYDROmorphone (DILAUDID) injection 0.5 mg (0.5 mg Intravenous Given 05/27/23 1449)    ED Course/ Medical Decision Making/ A&P                                 Medical Decision Making  This patient presents to the ED with chief complaint(s) of rib pain.  The complaint involves an extensive differential diagnosis and  also carries with it a high risk of complications and morbidity.   pertinent past medical history as listed in HPI  The differential diagnosis includes  Fracture, sprain, costochondritis, pneumothorax, ACS, PE, pneumonia The initial plan is to  Will start with x-rays Additional history obtained: Records reviewed previous admission documents, Care Everywhere/External Records, and Primary Care Documents  Initial Assessment:   Patient presents tachycardic and in clear discomfort with left rib pain following injury 3 days ago.  Patient states he had been painful but significantly worsened in the past day.  She has bilateral lung sounds, clear without rales or wheezes.  She is satting well on room air.  Tenderness over left ribs without gross deformities or wounds.  Independent ECG interpretation:  None   Independent labs interpretation:  The following labs were independently interpreted:  none  Independent visualization and interpretation of imaging: I independently visualized the following imaging with scope of interpretation limited to determining acute life threatening conditions related to emergency care: Rib x-ray, which revealed no acute rib fracture or cardiopulmonary disease  Treatment and Reassessment: Patient given Dilaudid 1 mg following first assessment  With persistent symptoms patient given lidocaine patch  Consultations obtained:   None  Disposition:   Patient will be discharged home.  Prescription for lidocaine patches sent into pharmacy.  The patient has been appropriately medically screened and/or stabilized in the ED. I have low suspicion for any other emergent medical condition which would require further screening, evaluation or treatment in the ED or require inpatient management. At time of discharge the patient is hemodynamically stable and in no acute distress. I have discussed work-up results and diagnosis with patient and answered all questions. Patient is  agreeable with discharge plan. We discussed strict return precautions for returning to the emergency department and they verbalized understanding.     Social Determinants of Health:   Patient's unhoused and impaired access to primary care  increases the complexity of managing their presentation  This note was dictated with voice recognition software.  Despite best efforts at proofreading, errors may have occurred which can change the documentation meaning.  Final Clinical Impression(s) / ED Diagnoses Final diagnoses:  Rib injury    Rx / DC Orders ED Discharge Orders          Ordered    lidocaine (LIDODERM) 5 %  Every 24 hours        05/27/23 1430              Halford Decamp, PA-C 05/27/23 1453    Sloan Leiter, DO 05/28/23 [phone]

## 2023-07-26 ENCOUNTER — Other Ambulatory Visit: Payer: Self-pay

## 2023-07-26 ENCOUNTER — Emergency Department (HOSPITAL_BASED_OUTPATIENT_CLINIC_OR_DEPARTMENT_OTHER)
Admission: EM | Admit: 2023-07-26 | Discharge: 2023-07-26 | Disposition: A | Discharge status: Home / Self Care | Payer: MEDICAID | Attending: Emergency Medicine | Admitting: Emergency Medicine

## 2023-07-26 ENCOUNTER — Encounter (HOSPITAL_BASED_OUTPATIENT_CLINIC_OR_DEPARTMENT_OTHER): Payer: Self-pay

## 2023-07-26 ENCOUNTER — Emergency Department (HOSPITAL_BASED_OUTPATIENT_CLINIC_OR_DEPARTMENT_OTHER): Payer: MEDICAID

## 2023-07-26 DIAGNOSIS — Z79899 Other long term (current) drug therapy: Secondary | ICD-10-CM | POA: Diagnosis not present

## 2023-07-26 DIAGNOSIS — R309 Painful micturition, unspecified: Secondary | ICD-10-CM | POA: Diagnosis present

## 2023-07-26 DIAGNOSIS — J449 Chronic obstructive pulmonary disease, unspecified: Secondary | ICD-10-CM | POA: Diagnosis not present

## 2023-07-26 DIAGNOSIS — N3000 Acute cystitis without hematuria: Secondary | ICD-10-CM

## 2023-07-26 DIAGNOSIS — R052 Subacute cough: Secondary | ICD-10-CM

## 2023-07-26 DIAGNOSIS — Z7951 Long term (current) use of inhaled steroids: Secondary | ICD-10-CM | POA: Diagnosis not present

## 2023-07-26 DIAGNOSIS — I10 Essential (primary) hypertension: Secondary | ICD-10-CM | POA: Insufficient documentation

## 2023-07-26 LAB — URINALYSIS, ROUTINE W REFLEX MICROSCOPIC
Bilirubin Urine: NEGATIVE
Glucose, UA: NEGATIVE mg/dL
Hgb urine dipstick: NEGATIVE
Ketones, ur: NEGATIVE mg/dL
Nitrite: NEGATIVE
Protein, ur: NEGATIVE mg/dL
Specific Gravity, Urine: 1.01 (ref 1.005–1.030)
pH: 6 (ref 5.0–8.0)

## 2023-07-26 LAB — URINALYSIS, MICROSCOPIC (REFLEX): RBC / HPF: NONE SEEN RBC/hpf (ref 0–5)

## 2023-07-26 MED ORDER — PROMETHAZINE HCL 25 MG PO TABS
12.5000 mg | ORAL_TABLET | Freq: Once | ORAL | Status: AC
  Administered 2023-07-26: 12.5 mg via ORAL
  Filled 2023-07-26: qty 1

## 2023-07-26 MED ORDER — HYDROMORPHONE HCL 1 MG/ML IJ SOLN
1.0000 mg | Freq: Once | INTRAMUSCULAR | Status: AC
  Administered 2023-07-26: 1 mg via INTRAMUSCULAR
  Filled 2023-07-26: qty 1

## 2023-07-26 MED ORDER — PROMETHAZINE HCL 25 MG PO TABS: 12.5000 mg | ORAL_TABLET | Freq: Four times a day (QID) | ORAL | 0 refills | Status: AC | PRN

## 2023-07-26 MED ORDER — CEPHALEXIN 500 MG PO CAPS: 500.0000 mg | ORAL_CAPSULE | Freq: Four times a day (QID) | ORAL | 0 refills | Status: AC

## 2023-07-26 NOTE — Discharge Instructions (Addendum)
 You are seen in the emergency department today for concerns of a cough and bladder irritation.  Your chest x-ray is negative for any signs of pneumonia or bronchitis.  I do suspect likely have ongoing postviral cough for postinfectious cough causing the prolonged coughing.  I do not feel starting a new round of antibiotics for the cough will be of much benefit for you.  Your urine however did show signs of infection so I will start you on a prescription called Keflex.  Please take this as prescribed for the next 5 days.  I did also send you a short course of cough medication to your pharmacy for symptom control.  Please follow-up with your primary care provider.

## 2023-07-26 NOTE — ED Triage Notes (Signed)
 Pt reports dx with "walking pneumonia" 3 days ago at [facility] steroids and antibiotics. Continues to cough up thick yellow mucous. Hx of double pneumonia several months ago and hasn't cleared.  Reports SOB which is worse in the morning. Pt able to speak in full sentence. No distress noted Pain lower back to lower legs concerned about bladder shrinking

## 2023-07-26 NOTE — ED Notes (Signed)
 Pt alert and oriented X 4 at the time of discharge. RR even and unlabored. No acute distress noted. Pt verbalized understanding of discharge instructions as discussed. Pt ambulatory to lobby at time of discharge.

## 2023-07-26 NOTE — ED Notes (Signed)
   07/26/23 1526  Respiratory Assessment  $ RT Protocol Assessment  Yes  Assessment Type Assess only  Respiratory Pattern Regular;Unlabored;Symmetrical;Dyspnea at rest  Chest Assessment Chest expansion symmetrical  Cough Congested;Non-productive  Bilateral Breath Sounds Coarse crackles;Diminished  Oxygen Therapy/Pulse Ox  O2 Therapy Room air  SpO2 100 %   Speaking in complete sentences without difficulty.

## 2023-07-26 NOTE — ED Provider Notes (Signed)
 [place] [facility] Provider Note   CSN: [id number] Arrival date & time: 07/26/23  1345     History Chief Complaint  Patient presents with   Cough    [name] is a 48 y.o. female.  Patient with past history significant for emphysema, COPD, bronchitis, GERD, hypertension presents to the emergency department today with concerns of a cough and urinary discomfort.  Patient reports that she has had pneumonia treated off and on for the last several months with her primary care provider's office.  She is currently on doxycycline  and amoxicillin  without significant improvement in symptoms.  She states that she is still having some productive coughing but denies any recent fever, chills or bodyaches.  No sick contacts.  She states that she has previously had to have her bladder "stretched".  She follows with urology through [facility].  She states that she is due for evaluation with urology but has not seen them recently.   Cough      Home Medications Prior to Admission medications   Medication Sig Start Date End Date Taking? Authorizing Provider  cephALEXin  (KEFLEX ) 500 MG capsule Take 1 capsule (500 mg total) by mouth 4 (four) times daily for 5 days. 07/26/23 07/31/23 Yes Issak Goley A, PA-C  promethazine  (PHENERGAN ) 25 MG tablet Take 0.5 tablets (12.5 mg total) by mouth every 6 (six) hours as needed for nausea or vomiting. 07/26/23  Yes Yanci Bachtell A, PA-C  acetaminophen  (TYLENOL ) 500 MG tablet Take 1,000 mg by mouth every 6 (six) hours as needed for mild pain, fever or headache.    [provider]  albuterol  (PROAIR  HFA) 108 (90 Base) MCG/ACT inhaler INHALE 2 PUFFS EVERY 6 HOURS AS NEEDED FOR SHORTNESS OF BREATH AND WHEEZING. 03/21/23   Ninetta Basket, MD  amoxicillin -clavulanate (AUGMENTIN ) 875-125 MG tablet Take 1 tablet by mouth every 12 (twelve) hours. 03/21/23   Ninetta Basket, MD  atomoxetine  (STRATTERA ) 10 MG capsule Take  10 mg by mouth daily.    [provider]  azithromycin  (ZITHROMAX ) 250 MG tablet Take 1 tablet (250 mg total) by mouth daily. Take first 2 tablets together, then 1 every day until finished. 03/21/23   Ninetta Basket, MD  budesonide -formoterol  (SYMBICORT ) 80-4.5 MCG/ACT inhaler Inhale 2 puffs into the lungs 2 (two) times daily. 10/20/22 03/06/23  Saguier, Gaylin Ke, PA-C  busPIRone  (BUSPAR ) 15 MG tablet Take 1 tablet (15 mg total) by mouth 3 (three) times daily. 09/13/22   Maczis, Michael M, PA-C  clonazePAM  (KLONOPIN ) 0.5 MG tablet Take 1 tablet (0.5 mg total) by mouth 3 (three) times daily as needed for anxiety. Patient taking differently: Take 0.5 mg by mouth 3 (three) times daily. 05/01/19   Estill Hemming, DO  cyanocobalamin  (VITAMIN B12) 1000 MCG tablet Take 1,000 mcg by mouth daily.    [provider]  estradiol  (ESTRACE ) 0.1 MG/GM vaginal cream Place 1 Applicatorful vaginally at bedtime. 11/21/19   [provider]  fluticasone  (FLONASE ) 50 MCG/ACT nasal spray Place 2 sprays into both nostrils daily. Patient taking differently: Place 2 sprays into both nostrils daily as needed for allergies. 04/22/19   Estill Hemming, DO  gabapentin  (NEURONTIN ) 800 MG tablet Take 0.5 tablets (400 mg total) by mouth in the morning, at noon, in the evening, and at bedtime. 02/01/21   Vada Garibaldi, MD  HYDROcodone -acetaminophen  (NORCO) 7.5-325 MG tablet Take 1 tablet by mouth every 6 (six) hours as needed for moderate pain (  pain score 4-6).    [provider]  hydrOXYzine  (ATARAX /VISTARIL ) 50 MG tablet Take 50 mg by mouth at bedtime. 08/26/18   [provider]  lidocaine  (LIDODERM ) 5 % Place 1 patch onto the skin daily. Remove & Discard patch within 12 hours or as directed by MD 05/27/23   Felicie Horning, PA-C  lipase/protease/amylase (CREON ) 36000 UNITS CPEP capsule Take 1 capsule (36,000 Units total) by mouth 4 (four) times daily -  with meals and at  bedtime. 04/26/21   Carlan, Chelsea L, NP  Magnesium  250 MG TABS Take 250 mg by mouth daily.    [provider]  metoprolol  tartrate (LOPRESSOR ) 25 MG tablet Take 0.5 tablets (12.5 mg total) by mouth 2 (two) times daily. Patient taking differently: Take 25 mg by mouth 2 (two) times daily. 09/13/22   Maczis, Michael M, PA-C  OLANZapine  (ZYPREXA ) 5 MG tablet Take 1 tablet (5 mg total) by mouth daily. 09/14/22   Maczis, Michael M, PA-C  omeprazole  (PRILOSEC) 40 MG capsule Take 1 capsule (40 mg total) by mouth 2 (two) times daily. 02/12/23   Carlan, Chelsea L, NP  ondansetron  (ZOFRAN ) 4 MG tablet Take 1 tablet (4 mg total) by mouth every 8 (eight) hours as needed for nausea or vomiting. 02/14/23   Carlan, Chelsea L, NP  oxybutynin  (DITROPAN  XL) 15 MG 24 hr tablet Take 15 mg by mouth daily. 09/16/19   [provider]  Oxycodone  HCl 10 MG TABS Take 1-1.5 tablets (10-15 mg total) by mouth every 6 (six) hours as needed for breakthrough pain. 09/13/22   Maczis, Michael M, PA-C  promethazine  (PHENERGAN ) 12.5 MG tablet Take 2 tablets (25 mg total) by mouth every 6 (six) hours as needed for nausea or vomiting. 02/14/23   Carlan, Chelsea L, NP  QUEtiapine  (SEROQUEL ) 400 MG tablet Take 800 mg by mouth at bedtime. 09/16/18   [provider]  RESTASIS  0.05 % ophthalmic emulsion Place 1 drop into both eyes 2 (two) times daily. 02/26/20   [provider]  traZODone  (DESYREL ) 150 MG tablet Take 150 mg by mouth at bedtime.    [provider]      Allergies    Abilify  [aripiprazole ], Amitriptyline, Metoclopramide hcl, Propoxyphene, Toradol  [ketorolac  tromethamine ], Tramadol , Ambien  [zolpidem  tartrate], Eszopiclone, Fentanyl , Nicoderm [nicotine ], Varenicline , Buprenorphine hcl, Demerol [meperidine], Emetrol, and Morphine and codeine     Review of Systems   Review of Systems  Respiratory:  Positive for cough.   Genitourinary:  Positive for difficulty urinating.  All other systems  reviewed and are negative.   Physical Exam Updated Vital Signs BP 95/66   Pulse 74   Temp 98 F (36.7 C) (Oral)   Resp 16   Wt 42.2 kg   SpO2 95%   BMI 15.96 kg/m  Physical Exam Vitals and nursing note reviewed.  Constitutional:      General: She is not in acute distress.    Appearance: She is well-developed.  HENT:     Head: Normocephalic and atraumatic.  Eyes:     Conjunctiva/sclera: Conjunctivae normal.  Cardiovascular:     Rate and Rhythm: Normal rate and regular rhythm.     Heart sounds: No murmur heard. Pulmonary:     Effort: Pulmonary effort is normal. No respiratory distress.     Breath sounds: Normal breath sounds.  Abdominal:     General: Abdomen is flat. Bowel sounds are normal. There is no distension.     Palpations: Abdomen is soft.  Tenderness: There is no abdominal tenderness. There is no right CVA tenderness, left CVA tenderness or guarding.  Musculoskeletal:        General: No swelling, tenderness or deformity.     Cervical back: Neck supple.  Skin:    General: Skin is warm and dry.     Capillary Refill: Capillary refill takes less than 2 seconds.  Neurological:     Mental Status: She is alert.  Psychiatric:        Mood and Affect: Mood normal.     ED Results / Procedures / Treatments   Labs (all labs ordered are listed, but only abnormal results are displayed) Labs Reviewed  URINALYSIS, ROUTINE W REFLEX MICROSCOPIC - Abnormal; Notable for the following components:      Result Value   Leukocytes,Ua TRACE (*)    All other components within normal limits  URINALYSIS, MICROSCOPIC (REFLEX) - Abnormal; Notable for the following components:   Bacteria, UA RARE (*)    All other components within normal limits    EKG EKG Interpretation Date/Time:  Thursday July 26 2023 14:40:15 EDT Ventricular Rate:  73 PR Interval:  132 QRS Duration:  73 QT Interval:  389 QTC Calculation: 429 R Axis:   84  Text Interpretation: Sinus rhythm Consider  left atrial enlargement Minimal ST depression, inferior leads Similar to Dec 2024 tracing Confirmed by Abby Hocking [phone]) on 07/26/2023 2:58:06 PM  Radiology DG Chest 2 View Result Date: 07/26/2023 CLINICAL DATA:  Cough.  Shortness of breath. EXAM: CHEST - 2 VIEW COMPARISON:  05/27/2023. FINDINGS: Bilateral lungs appear hyperexpanded and hyperlucent with coarse bronchovascular markings, concerning for underlying COPD. Bilateral lungs otherwise appear clear. No dense consolidation or lung collapse. Bilateral costophrenic angles are clear. Normal cardio-mediastinal silhouette. No acute osseous abnormalities. The soft tissues are within normal limits. There are surgical clips in the right upper quadrant, typical of a previous cholecystectomy. IMPRESSION: No active cardiopulmonary disease.  Probable mild underlying COPD. Electronically Signed   By: Beula Brunswick M.D.   On: 07/26/2023 15:23    Procedures Procedures    Medications Ordered in ED Medications  promethazine  (PHENERGAN ) tablet 12.5 mg (12.5 mg Oral Given 07/26/23 1616)  HYDROmorphone  (DILAUDID ) injection 1 mg (1 mg Intramuscular Given 07/26/23 1641)    ED Course/ Medical Decision Making/ A&P                                 Medical Decision Making Amount and/or Complexity of Data Reviewed Labs: ordered. Radiology: ordered.  Risk Prescription drug management.   This patient presents to the ED for concern of cough.  Differential diagnosis includes viral URI, pneumonia, UTI, pyelonephritis   Lab Tests:  I Ordered, and personally interpreted labs.  The pertinent results include: UA consistent with concerns for infection with bacteria and leukocytes seen.   Imaging Studies ordered:  I ordered imaging studies including chest x-ray I independently visualized and interpreted imaging which showed no active cardiopulmonary disease.  Probable mild underlying COPD. I agree with the radiologist interpretation   Medicines ordered  and prescription drug management:  I ordered medication including Phenergan , Dilaudid  for nausea, pain Reevaluation of the patient after these medicines showed that the patient improved I have reviewed the patients home medicines and have made adjustments as needed   Problem List / ED Course:  Patient past history significant for COPD, hypertension, emphysema, GERD presents the emergency department today with concerns of a  cough and urinary discomfort.  Reports he has been treated multiple rounds of antibiotics since December for pneumonia.  She reportedly follows with [facility] for primary care services.  She states that she has not had any recent chest x-ray with any providers.  She is concerned that her cough is not clearing.  Endorses productive coughing but denies any recent fever or chills. Physical exam reveals rhonchorous lung sounds in bilateral lung fields.  Sounds are consistent with COPD and no evident rales present.  Will obtain chest x-ray for further assessment.  UA ordered for assessment of urinary discomfort the patient reports has some difficulty with urination but denies any obvious dysuria or hematuria.  States that she is concerned for possible infection as she catheterizes. Chest x-ray is reassuring.  No acute process ongoing with mild underlying COPD seen.  UA concerning for possible infection with bacteria and leukocytes present.  Will initiate treatment with Keflex  for suspected UTI.  Advised patient that no indication exist for further treatment with antibiotics for pneumonia given lack of findings on chest x-ray and patient received multiple rounds.  Advise continued use of cough medications to control the cough that she reports is not responding well to any interventions.  Advise close follow-up with primary care provider.  Return precautions discussed.  Patient otherwise stable for outpatient follow-up and discharged home.  Final Clinical Impression(s) / ED  Diagnoses Final diagnoses:  Acute cystitis without hematuria  Subacute cough    Rx / DC Orders ED Discharge Orders          Ordered    cephALEXin  (KEFLEX ) 500 MG capsule  4 times daily        07/26/23 1717    promethazine  (PHENERGAN ) 25 MG tablet  Every 6 hours PRN        07/26/23 1717              Nielle Duford A, PA-C 07/26/23 1734    Long, Joshua G, MD 07/27/23 [phone]

## 2023-08-13 ENCOUNTER — Ambulatory Visit (INDEPENDENT_AMBULATORY_CARE_PROVIDER_SITE_OTHER): Payer: BC Managed Care – PPO | Admitting: Gastroenterology

## 2023-09-11 NOTE — ED Provider Notes (Signed)
 ------------------------------------------------------------------------------- Attestation signed by Duwaine Consepcion Ruth, MD at 09/13/2023 10:24 PM ATTENDING SUPERVISORY SHARED SERVICE NOTE  I supervised the care provided by the APP. We have discussed the patient's case. I have reviewed the note and agree with the plan of treatment.  I personally seen and examined the patient, providing direct face to face care.    I performed a substantive portion of the history, physical exam, MDM for the patient encounter, as documented by the APP.    Patient's presentation is most consistent with acute presentation with potential threat to life or bodily function     -------------------------------------------------------------------------------            Chief Complaint  Patient presents with  . Weight Loss  . Chest Pain       HPI Patient is a 48 year old female with past medical history of emphysema, COPD, bronchitis, GERD, hypertension, fibromyalgia, interstitial cystitis, bipolar, depression, anxiety who presents due to generalized pain, inability to gain weight, and difficulty tolerating p.o. intake.  Patient states for the past several years she has had daily generalized pain including abdominal pain and headaches.  States that she has been unable to tolerate any solid food due to pain, nausea, and vomiting.  Does tolerate p.o. liquids and typically drinks soda or tea.  States that she has had persistent urinary frequency in the setting of interstitial cystitis with constant lower abdominal pain.  She states that despite her efforts she is unable to gain weight. She denies SI, HI, or AVH.  She endorses increased anxiety and depression.  States that she has had loose stool/diarrhea for some time that will intermittently worsen. She denies fever. She is s/p hysterectomy, no other reported abdominal surgeries. Is followed by [facility] for PCP and psychiatric care.   History provided  by:  Patient and medical records Language interpreter used: No       Patient History Past Medical History:  Diagnosis Date  . Abnormal Pap smear of cervix   . Acute bronchitis with chronic obstructive pulmonary disease (COPD)    (CMD)   . Adrenal mass (CMD)   . Anxiety   . Asthma (CMD)   . Bipolar affective    (CMD)   . Carcinoma in situ of bladder   . Chronic back pain   . Chronic obstructive pulmonary disease    (CMD) 03/08/2011  . Cystitis   . Depression   . Dysuria   . Fibromyalgia   . Flank pain   . Gastroesophageal reflux disease 03/08/2011  . History of colonic polyps   . History of irritable bowel syndrome   . History of polycystic ovarian syndrome   . Nocturia   . Personal history of endometriosis   . PONV (postoperative nausea and vomiting)   . Sciatica    Right leg   . Small bowel obstruction    (CMD)   . Tobacco use 03/08/2011  . Ureteral obstruction   . Ureteral stone   . Urgency of urination   . Urinary frequency   . Urinary tract infection   . Urine retention   . Vaginal infection   . Vaginal pain    Past Surgical History:  Procedure Laterality Date  . ABDOMINAL HYSTERECTOMY  2006   Procedure: ABDOMINAL HYSTERECTOMY; ?endometriosis and enlarged uterus  . ABDOMINAL SURGERY      Procedure: ABDOMINAL SURGERY; H/O multiple surgeries.  . ABDOMINAL SURGERY      Procedure: ABDOMINAL SURGERY; Multiple for endometriosis  . BILATERAL SALPINGOOPHORECTOMY  2006   Procedure: BILATERAL SALPINGOOPHORECTOMY  . BLADDER SURGERY Right 02/16/2023   EXCHANGE INTERSTIM FULL ([employer]) Removal and Replacement performed by Lamar JINNY Sar, MD at [facility] OR  . CHOLECYSTECTOMY     Procedure: CHOLECYSTECTOMY  . COLONOSCOPY     Procedure: COLONOSCOPY  . CYSTOSCOPY W/ DILATION OF BLADDER  05/17/2012   Procedure: CYSTOSCOPY W/ HYDRODISTENTION;  Surgeon: Lamar Lynwood Sar, MD;  Location: [facility] OUTPATIENT OR;  Service: Urology;  Laterality: N/A;  . CYSTOSCOPY W/ DILATION OF  BLADDER N/A 03/31/2013   Procedure: CYSTOSCOPY W/ HYDRODISTENTION;  Surgeon: Lamar Lynwood Sar, MD;  Location: [facility] OUTPATIENT OR;  Service: Urology;  Laterality: N/A;  . CYSTOSCOPY W/ DILATION OF BLADDER N/A 07/17/2016   Procedure: CYSTOSCOPY W/ HYDRODISTENTION;  Surgeon: Lamar Lynwood Sar, MD;  Location: [facility] OUTPATIENT OR;  Service: Urology;  Laterality: N/A;  . CYSTOSCOPY W/ DILATION OF BLADDER N/A 07/02/2017   Procedure: CYSTOSCOPY W/ HYDRODISTENTION;  Surgeon: Lamar Lynwood Sar, MD;  Location: [facility] OUTPATIENT OR;  Service: Urology;  Laterality: N/A;  . CYSTOSCOPY W/ DILATION OF BLADDER N/A 07/18/2019   Procedure: CYSTOSCOPY W/ HYDRODISTENTION;  Surgeon: Lamar Lynwood Sar, MD;  [facility];  Service: Urology;  Laterality: N/A;  . CYSTOSCOPY W/ DILATION OF BLADDER N/A 07/09/2020   Procedure: CYSTOSCOPY W/ HYDRODISTENTION / BOTOX INJECTION;  Surgeon: Lamar Lynwood Sar, MD;  Location: [facility] MAIN OR;  Service: Urology;  Laterality: N/A;  . CYSTOSCOPY W/ DILATION OF BLADDER N/A 02/11/2021   Procedure: CYSTOSCOPY W/ HYDRODISTENTION / BOTOX INJECTION;  Surgeon: Lamar Lynwood Sar, MD;  Location: [facility] MAIN OR;  Service: Urology;  Laterality: N/A;  . CYSTOSCOPY W/ DILATION OF BLADDER N/A 02/17/2022   Procedure: CYSTOSCOPY W/ HYDRODISTENTION / BOTOX INJECTION;  Surgeon: Sar Lamar Lynwood, MD;  Location: [facility] MAIN OR;  Service: Urology;  Laterality: N/A;  . CYSTOSCOPY W/ URETERAL STENT REMOVAL     Procedure: CYSTOSCOPY W/ URETERAL STENT REMOVAL  . ELECTROCONVULSIVE THERAPY N/A 04/26/2022   Procedure: ELECTROCONVULSIVE THERAPY;  Surgeon: Lesia Kathleen Shillings, MD;  Location: [facility] MAIN OR;  Service: ECT;  Laterality: N/A;  . ELECTROCONVULSIVE THERAPY N/A 04/28/2022   Procedure: ELECTROCONVULSIVE THERAPY;  Surgeon: Lesia Kathleen Shillings, MD;  Location: [facility] MAIN OR;  Service: ECT;  Laterality: N/A;  . ELECTROCONVULSIVE THERAPY N/A 05/01/2022   Procedure: ELECTROCONVULSIVE THERAPY;  Surgeon:  Lesia Kathleen Shillings, MD;  Location: [facility] MAIN OR;  Service: ECT;  Laterality: N/A;  . ELECTROCONVULSIVE THERAPY N/A 05/03/2022   Procedure: ELECTROCONVULSIVE THERAPY;  Surgeon: Lesia Kathleen Shillings, MD;  Location: [facility] MAIN OR;  Service: ECT;  Laterality: N/A;  . ELECTROCONVULSIVE THERAPY N/A 05/05/2022   Procedure: ELECTROCONVULSIVE THERAPY;  Surgeon: Lesia Kathleen Shillings, MD;  Location: [facility] MAIN OR;  Service: ECT;  Laterality: N/A;  . ELECTROCONVULSIVE THERAPY N/A 05/08/2022   Procedure: ELECTROCONVULSIVE THERAPY;  Surgeon: Lesia Kathleen Shillings, MD;  Location: [facility] MAIN OR;  Service: ECT;  Laterality: N/A;  . ELECTROCONVULSIVE THERAPY N/A 05/10/2022   Procedure: ELECTROCONVULSIVE THERAPY;  Surgeon: Lesia Kathleen Shillings, MD;  Location: [facility] MAIN OR;  Service: ECT;  Laterality: N/A;  . ELECTROCONVULSIVE THERAPY N/A 05/12/2022   Procedure: ELECTROCONVULSIVE THERAPY;  Surgeon: Lesia Kathleen Shillings, MD;  Location: [facility] MAIN OR;  Service: ECT;  Laterality: N/A;  . ELECTROCONVULSIVE THERAPY N/A 05/23/2022   Procedure: ELECTROCONVULSIVE THERAPY;  Surgeon: Thedford Fairy Sieving, MD;  Location: [facility] MAIN OR;  Service: ECT;  Laterality: N/A;  . ELECTROCONVULSIVE THERAPY N/A 05/30/2022   Procedure: ELECTROCONVULSIVE THERAPY;  Surgeon: Nicolas Lynwood Dickens,  MD;  Location: [facility] MAIN OR;  Service: ECT;  Laterality: N/A;  . ELECTROCONVULSIVE THERAPY N/A 06/05/2022   Procedure: ELECTROCONVULSIVE THERAPY;  Surgeon: Lesia Arthea All, MD;  Location: [facility] MAIN OR;  Service: ECT;  Laterality: N/A;  . ESOPHAGOGASTRODUODENOSCOPY     Procedure: ESOPHAGOGASTRODUODENOSCOPY  . HYSTERECTOMY      Procedure: HYSTERECTOMY  . INTERSTIM GENERATOR PLACEMENT N/A 06/10/2021   Procedure: RENNA PLACEMENT STAGE 1 & 2-[employer];  Surgeon: Lamar Lynwood Sar, MD;  Location: [facility] MAIN OR;  Service: Urology;  Laterality: N/A;  . INTERSTIM IMPLANT REMOVAL Left 11/14/2019   Procedure: INTERSTIM IMPLANT REMOVAL;   Surgeon: Lamar Lynwood Sar, MD;  Location: [facility];  Service: Urology;  Laterality: Left;  . INTERSTIM REVISION  10/30/2011   Procedure: RENNA REVISION;  Surgeon: Lamar Lynwood Sar, MD;  Location: [facility] MAIN OR;  Service: Urology;  Laterality: N/A;  . INTERSTIM REVISION Left 06/26/2014   Procedure: INTERSTIM REVISION lead possible battery;  Surgeon: Lamar Lynwood Sar, MD;  Location: [facility] OUTPATIENT OR;  Service: Urology;  Laterality: Left;  Will need xray  . OTHER SURGICAL HISTORY     Procedure: OTHER SURGICAL HISTORY (bladder infustions); for interstitial cystitis  . SPINAL CORD STIMULATOR IMPLANT     Procedure: SPINAL CORD STIMULATOR IMPLANT; for treatment of chronic pain  . URETERAL STENT PLACEMENT     Procedure: URETERAL STENT PLACEMENT   Family History  Problem Relation Name Age of Onset  . Coronary artery disease Father    . Colon cancer Neg Hx    . Anesthesia problems Neg Hx    . Breast cancer Neg Hx     Social History   Tobacco Use  . Smoking status: Every Day    Current packs/day: 0.50    Average packs/day: 0.5 packs/day for 24.4 years (12.2 ttl pk-yrs)    Types: Cigarettes    Start date: 04/11/1999  . Smokeless tobacco: Never  Vaping Use  . Vaping status: Never Used  Substance Use Topics  . Alcohol  use: Not Currently    Comment: 5 yrs sober      Review of Systems Review of Systems  Constitutional:  Positive for fatigue. Negative for chills and fever.  HENT:  Negative for congestion.   Respiratory:  Negative for cough and shortness of breath.   Cardiovascular:  Negative for chest pain.  Gastrointestinal:  Positive for abdominal pain, nausea and vomiting.  Genitourinary:  Positive for frequency. Negative for decreased urine volume, dysuria, flank pain and urgency.  Musculoskeletal: Negative.   Skin: Negative.   Neurological:  Negative for dizziness and headaches.  Psychiatric/Behavioral: Negative.        Physical Exam ED Triage Vitals  Temp 09/11/23  1709 97.4 F (36.3 C)  Heart Rate 09/11/23 1709 94  Resp 09/11/23 1709 20  BP 09/11/23 1709 102/75  MAP (mmHg) 09/11/23 1851 78  SpO2 09/11/23 1709 99 %  O2 Device 09/11/23 1709 None (Room air)  O2 Flow Rate (L/min) --   Weight 09/12/23 0107 35.8 kg (79 lb)   Physical Exam Vitals and nursing note reviewed.  Constitutional:      Appearance: Normal appearance.  HENT:     Head: Normocephalic and atraumatic.  Eyes:     Extraocular Movements: Extraocular movements intact.     Conjunctiva/sclera: Conjunctivae normal.  Cardiovascular:     Rate and Rhythm: Normal rate and regular rhythm.     Heart sounds: Normal heart sounds.  Pulmonary:     Effort: Pulmonary effort is normal.  No tachypnea or respiratory distress.     Breath sounds: Normal breath sounds. No wheezing or rhonchi.  Abdominal:     Palpations: Abdomen is soft.     Tenderness: There is generalized abdominal tenderness.  Musculoskeletal:     Cervical back: Full passive range of motion without pain and normal range of motion.     Right lower leg: No edema.     Left lower leg: No edema.     Comments: Moving all 4 extremities.  Patient endorses generalized tenderness to palpation.  Skin:    General: Skin is warm.  Neurological:     General: No focal deficit present.     Mental Status: She is alert and oriented to person, place, and time.     GCS: GCS eye subscore is 4. GCS verbal subscore is 5. GCS motor subscore is 6.  Psychiatric:        Attention and Perception: Attention normal.        Mood and Affect: Mood normal.        Speech: Speech normal.        Behavior: Behavior normal.        Thought Content: Thought content normal.        Cognition and Memory: Cognition normal.        Judgment: Judgment normal.        CHA2DS2-VASc Score: N/A  Glasgow Coma Scale Score: 15                 Procedures                       ED Course & MDM ED Course as of 09/12/23 0927  Tue Sep 11, 2023  2321 S- generalized pain,  po intol. Ct, po challenge and dtrt  [RC]  Wed Sep 12, 2023  0317 CT Abdomen Pelvis W Contrast IMPRESSION: No acute findings within the abdomen or pelvis.  [PN]    ED Course User Index [PN] Maude Marye Banks, DO [RC] Darina Glendia Axe, MD   Medical Decision Making Problems Addressed: Generalized pain: complicated acute illness or injury  Amount and/or Complexity of Data Reviewed Labs: ordered. Radiology: ordered. Decision-making details documented in ED Course. ECG/medicine tests: ordered.  Risk Prescription drug management.  Patient is a 48 year old female with past medical history of emphysema, COPD, bronchitis, GERD, hypertension, fibromyalgia, interstitial cystitis, bipolar, depression, anxiety who presents due to generalized pain, inability to gain weight, and difficulty tolerating p.o. intake.  Patient states for the past several years she has had daily generalized pain including abdominal pain and headaches.  States that she has been unable to tolerate any solid food due to pain, nausea, and vomiting.  Does tolerate p.o. liquids and typically drinks soda or tea.  States that she has had persistent urinary frequency in the setting of interstitial cystitis with constant lower abdominal pain.  She states that despite her efforts she is unable to gain weight. She denies SI, HI, or AVH.  She endorses increased anxiety and depression.  She has undergone ECT in the past.  States that she has had loose stool/diarrhea for some time that will intermittently worsen. She denies fever. She is s/p hysterectomy.  She also has previously had PEG/GJ tubes in the past but had GJ removed November 2024.  She denies change in location, intensity, or frequency of pain over the past several months. She is followed by [facility] for PCP and psychiatric care.  Discussed  patient's presentation today-she states that this is not typical of decompensated bipolar for her and she does not feel her  symptoms are secondary to bipolar.  Upon arrival patient is hemodynamically stable nontoxic-appearing.  Regular rate and rhythm.  Lungs without crackles or wheezing.  Patient endorses tenderness with generalized light palpation.  However, she does tolerate stethoscope to chest and back.  Abdomen appears soft with generalized tenderness.  Moving all 4 extremities.  Plan for labs, urine, chest x-ray to evaluate further.  Differential to include infectious etiology such as pneumonia or UTI, electrolyte derangement, ketoacidosis, anemia, infection 2/2 hx of PEG/GJ.   Labs reviewed and summarized.  CBC reveals no leukocytosis.  Hemoglobin hematocrit within normal limits.  CMP reveals no gross electrolyte abnormalities.  Kidney function and liver function within normal limits.  Glucose 157, CO2 and anion gap within normal limits.  Beta hydroxybutyrate <0.20.  UA with specific gravity of 1.004 otherwise unremarkable.  No leukocytes, nitrites, or blood noted. Chest x-ray without acute cardiopulmonary abnormalities.  Given abd pain and tenderness palpation with reported inability to tolerate p.o. with known history of feeding tubes plan for CT to evaluate further.  Per chart review patient has not had abdominal imaging in some time.  Nausea addressed and will attempt to address pain and anxiety here.  The patient's entire clinical course is incomplete at my shift end, care is taken over by assuming physician, and requires follow-up on CT results and p.o. challenge.. Please refer to their documentation regarding continued ED course along with final impression and disposition. The patient's vitals were stable at the time of transferred care.  Patient's presentation is most consistent with acute complicated illness / injury requiring diagnostic workup. This was a shared visit with my attending, who also evaluated pt, personally interpreted lab results, and is agreeable to plan.    ED Disposition:  Discharge Final  diagnoses:  Generalized pain    ED Prescriptions   None

## 2023-09-12 NOTE — ED Provider Notes (Signed)
-------------------------------------------------------------------------------   Attestation signed by Alm Lynwood Curtis, MD at 09/16/2023  1:05 PM Tue 09/11/2023 11:02 PM   I have reviewed the resident's note and agree with the pertinent history and physical exam findings except where specifically noted below. I have personally reviewed all the labs and imaging performed on the patient during this visit. I have reviewed the nursing documentation. I have reviewed past medical history, social history, and current medication list.   Care of patient assumed from Dr. Jama at 11:02 PM.   Stable.   17F with COPD, HTN, BPD, fibromyalgia p/w pain and vomiting. Weight loss, decreased PO intake since G-tube removal several months ago. Antiemetics given. CT a/p pending.   CT unremarkable. Tolerating PO -------------------------------------------------------------------------------  Transfer of Care Note I assumed care of [name] on 09/12/2023 at 12:36 AM.  Briefly, [name] is a 48 y.o. female who: PMHx: emphysema, COPD, bronchitis, GERD, hypertension, fibromyalgia, interstitial cystitis, bipolar, depression, anxiety  P/w generalized pain, inability to gain weight, and difficulty tolerating p.o. intake.   Plan at the time of handoff: Follow-up CT abdomen, p.o. challenge, determine dispo.   Please refer to the original provider's note for additional information regarding the care of [name].  Reassessment: I personally reassessed the patient:  Vital Signs:  The most current vitals were  Vitals:   09/12/23 0112 09/12/23 0131 09/12/23 0145 09/12/23 0201  BP:   105/72   BP Location:   Right arm   Patient Position:   Lying   Pulse:   58   Resp:  16 18 16   Temp:   97.4 F (36.3 C)   TempSrc:   Oral   SpO2: 99%  98%   Weight:      Height:       Hemodynamics:  The patient is hemodynamically stable. Mental Status:  The patient is alert  Additional MDM:  Discharge:  Patient is felt to be medically appropriate for discharge at this time. Patient was informed of all pertinent physical exam, laboratory, and imaging findings. Patients suspected etiology of their symptom presentation was discussed with the patient and all questions were answered. Patient was instructed to follow up with their primary care doctor in 3 days for re-evaluation. Patient was given strict return precautions.   Clinical Impression:  1. Generalized pain     Patient remained stable and had no acute events while under my care in the Emergency Department.   ED Disposition     ED Disposition  Discharge   Condition  Stable   Comment  --         The plan for this patient was discussed with Dr. Alm Lynwood Curtis, MD, who voiced agreement and who oversaw evaluation and treatment of this patient.   Electronically signed by:  Maude Marye Banks, DO 09/12/2023 12:36 AM  Note is dictated utilizing voice recognition software. Unfortunately this leads to occasional typographical errors. I apologize in advance if the situation occurs. If questions occur, please do not hesitate to reach out to me directly.   No data recorded

## 2023-09-27 ENCOUNTER — Encounter (INDEPENDENT_AMBULATORY_CARE_PROVIDER_SITE_OTHER): Payer: Self-pay | Admitting: Gastroenterology

## 2023-09-27 ENCOUNTER — Ambulatory Visit (INDEPENDENT_AMBULATORY_CARE_PROVIDER_SITE_OTHER): Payer: MEDICAID | Admitting: Gastroenterology

## 2023-09-27 VITALS — BP 101/65 | HR 94 | Temp 98.3°F | Ht 64.0 in | Wt 81.9 lb

## 2023-09-27 DIAGNOSIS — K8681 Exocrine pancreatic insufficiency: Secondary | ICD-10-CM

## 2023-09-27 DIAGNOSIS — R1310 Dysphagia, unspecified: Secondary | ICD-10-CM | POA: Diagnosis not present

## 2023-09-27 DIAGNOSIS — R110 Nausea: Secondary | ICD-10-CM

## 2023-09-27 DIAGNOSIS — K8689 Other specified diseases of pancreas: Secondary | ICD-10-CM

## 2023-09-27 DIAGNOSIS — K219 Gastro-esophageal reflux disease without esophagitis: Secondary | ICD-10-CM

## 2023-09-27 DIAGNOSIS — K589 Irritable bowel syndrome without diarrhea: Secondary | ICD-10-CM

## 2023-09-27 DIAGNOSIS — E43 Unspecified severe protein-calorie malnutrition: Secondary | ICD-10-CM | POA: Diagnosis not present

## 2023-09-27 MED ORDER — ONDANSETRON HCL 4 MG PO TABS: 4.0000 mg | ORAL_TABLET | Freq: Three times a day (TID) | ORAL | 1 refills | Status: DC | PRN

## 2023-09-27 NOTE — Patient Instructions (Signed)
 Continue creon  36k units with meals -continue phenergan  12.5mg  every 6 hours as needed for nausea  -zofran  4mg  every 8 hours as needed for nausea  -continue omeprazole  40mg  twice a day  -we will send referral to either IR or general surgery for feeding tube replacement   Follow up 4 months

## 2023-09-27 NOTE — Progress Notes (Signed)
 Referring Provider: Orpha Yancey LABOR, MD Primary Care Physician:  Orpha Yancey LABOR, MD Primary GI Physician: Dr. Eartha   Chief Complaint  Patient presents with   Malnutrition Screening Tool    Pt arrives due to malnutrition. Pt states Dr.Hasanaj wants our providers to replace feeding tube. Pt states she is not gaining weight. Pt has last feeding tube taken out earlier this year (April or May) due to not using it. Pt states she also stays tired. Tube needs to be  bypassed stomach and routed directly to intestines.    HPI:   [name] is a 48 y.o. female with past medical history of BIpolar disorder, cervical cancer, COPD, depression, fibromyalgia, GERD, HTN, IBS, seizures, hx of substance abuse, OSA, HLD, gastroparesis    Patient presenting today for:  Follow up of GERD/dysphagia IBS EPI  Severe protein calorie malnutrition   Last seen November 2024, at that time noted she needed to get feeding tube removed due to infections. Has gained some weight on creon , appetite improving. Having a BM every 2 days, stools softer. Dysphagia improved, GERD mostly well controlled on Omeprazole  BID. Using phenergan  and zofran  PRN for chronic nausea. Trying to quit smoking  Recommended continue PPI BID, continue creon , referral to gen surgery for feeding tube removal, consider esophageal manometry if dysphagia persisted, work on smoking cessation with PCP/mental health provider.  Present:  Patient states that she had feeding tube removed in April or may as she was getting continuous infections and could not plug in her tube feeds due to her living situation. She has discussed having feeding tube replaced with her PCP and they are in agreement that she needs to have tube replaced again. She states that tube was removed by a doctor in [place] (per chart review, appears tube was removed by Dr. Kallie in November 2024). She is unsure who placed feeding tube to begin with. She states that she is losing  weight rapidly. Appetite is good, she is eating but states that she has frequent vomiting, she also has diarrhea. She is still taking creon . Taking phenergan , previously on zofran  which seemed to help but she rand out of this. Phenergan  does not seem to be doing much to help her nausea.  She is allergic to reglan. Having 3-4 episodes of diarrhea per day on her creon . No blood in stools or black stools  She continues to have issues with swallowing, notes continued issues with dysphagia since prior to last EGD in July 2024.  She is taking omeprazole  40mg  BID which seems to mostly control her reflux symptoms.   MBSS 08/2022: Pt demonstrates no weakness, residue or aspiration. She was mildly anxious throught test, disgusted by barium. No dentures present but pt did attempt a nutrigrain bar and some peaches. Barium tablet passed esophageal column easily. Pt started belching and reported globus after minimal trials. There is no oral or oropharyngeal impairment impacting swallowing. Pt seems to suffer from chronic illness and could potentially have some food aversion impacted further by globus or digestive complaint. Pt does report she drinks fluids all day, but relies on tube feeding for calories. When SLP suggested eating food pt became more anxious and stated several reasons why this would not work. Pt may need f/u with SLP for feeding therapy to resume ability to consume solid foods, but she would need to be motivated to work toward that goal. At this time pt expresses no interest in referral. Ok to resume full liquids while admitted. Pt  also able to take pills orally.   Last EGD; 10/2022  - No endoscopic esophageal abnormality to explain                            patient's dysphagia.                           - Gastritis. Biopsied. Internal bumper for                            gastro-jujenal tube in place                           - Normal duodenal bulb and second portion of the                             duodenum.                           - Biopsies were taken with a cold forceps for                            evaluation of eosinophilic esophagitis. Biopsies normal Colonoscopy 05/2021:- Five 3 to 10 mm polyps in the descending colon,                            in the transverse colon, in the ascending colon and                            at the ileocecal valve, removed with a cold snare.                            Resected and retrieved.                           - The distal rectum and anal verge are normal on                            retroflexion view. (5 TAs, largest 1 cm) GES: 01/2022 minimally delayed emptying at 4 hours  Flex sig:06/11/20 - attempted colonoscopy, converted to flex sig due to presence of solid stool, Stool in the rectum, in the recto-sigmoid colon and in the sigmoid colon. - No specimens collected.   Repeat colonoscopy 3 years  Past Medical History:  Diagnosis Date   Anal fissure 03/11/2009   Anemia    Anxiety and depression    ARTHRITIS 08/31/2007   Asthma    BENZODIAZEPINE ADDICTION 08/31/2007   Bipolar 1 disorder (HCC)    Bowel obstruction (HCC)    Breakdown of urinary electronic stimulator device, init (HCC)    BRONCHITIS, RECURRENT 08/23/2009   Asthmatic Bronchitis-Dr. Darlean.....-HFA 75% 12/04/2008>75% 02/05/2009>75% 08/04/2009 -PFT's 01/04/2009 2.56 (86%) ratio 75, no resp to B2 and DLC0 67% > 80 after correction    Cancer (HCC)    cervical cancer   Chronic interstitial cystitis 03/11/2009   Chronic nausea    Chronic pain    COLONIC POLYPS, HX OF 07/25/2006   ADENOMATOUS  POLYP   COPD (chronic obstructive pulmonary disease) (HCC)    Endometriosis    FIBROMYALGIA 08/31/2007   GERD 02/05/2009   History of kidney stones    Hyperlipidemia    HYPERTENSION 08/31/2007   IBS 03/11/2009   Internal hemorrhoids    Migraine headache    Pancreatitis    PONV (postoperative nausea and vomiting)    RECTAL BLEEDING 03/11/2009   Sciatica    right leg    Seizures (HCC)    been about 1 year since last seisure per pt   Substance abuse (HCC)    Thyroid  disease    Uterine cyst     Past Surgical History:  Procedure Laterality Date   ABDOMINAL HYSTERECTOMY     BIOPSY  10/25/2018   Procedure: BIOPSY;  Surgeon: Golda Claudis PENNER, MD;  Location: [facility];  Service: Endoscopy;;  gastric   BIOPSY  06/11/2020   Procedure: BIOPSY;  Surgeon: Eartha Angelia Sieving, MD;  Location: [facility];  Service: Gastroenterology;;  small bowel   BIOPSY  10/26/2022   Procedure: BIOPSY;  Surgeon: Cinderella Deatrice FALCON, MD;  Location: [facility];  Service: Endoscopy;;   bladder stretching x6     BLADDER SURGERY     stimulator placed and stretching    CHOLECYSTECTOMY     COLONOSCOPY     COLONOSCOPY WITH PROPOFOL  N/A 09/03/2014   Procedure: COLONOSCOPY WITH PROPOFOL ;  Surgeon: Sieving SHAUNNA Cedar, MD;  Location: [facility];  Service: Endoscopy;  Laterality: N/A;   COLONOSCOPY WITH PROPOFOL  N/A 05/20/2021   Procedure: COLONOSCOPY WITH PROPOFOL ;  Surgeon: Eartha Angelia Sieving, MD;  Location: [facility];  Service: Gastroenterology;  Laterality: N/A;  10:10 / Asa 2 pt states she has had a hysterectomy   ESOPHAGOGASTRODUODENOSCOPY (EGD) WITH PROPOFOL  N/A 10/25/2018   Procedure: ESOPHAGOGASTRODUODENOSCOPY (EGD) WITH PROPOFOL ;  Surgeon: Golda Claudis PENNER, MD;  [facility]: [facility];  Service: Endoscopy;  Laterality: N/A;  11:15   ESOPHAGOGASTRODUODENOSCOPY (EGD) WITH PROPOFOL  N/A 06/11/2020   Procedure: ESOPHAGOGASTRODUODENOSCOPY (EGD) WITH PROPOFOL ;  Surgeon: Eartha Angelia Sieving, MD;  [facility]: [facility];  Service: Gastroenterology;  Laterality: N/A;   ESOPHAGOGASTRODUODENOSCOPY (EGD) WITH PROPOFOL  N/A 10/26/2022   Procedure: ESOPHAGOGASTRODUODENOSCOPY (EGD) WITH PROPOFOL ;  Surgeon: Cinderella Deatrice FALCON, MD;  [facility]: [facility];  Service: Endoscopy;  Laterality: N/A;  9:00am;asa 3   interstitial cystitis     IR GASTROSTOMY TUBE MOD SED   01/27/2021   PACEMAKER INSERTION     in hip for interstitial cystitis   pacemaker removal     PEG TUBE PLACEMENT     POLYPECTOMY  05/20/2021   Procedure: POLYPECTOMY;  Surgeon: Eartha Angelia, Sieving, MD;  Location: [facility];  Service: Gastroenterology;;   removal of uterine cyst and scrapped uterus     replaced bladder pacemaker      Current Outpatient Medications  Medication Sig Dispense Refill   acetaminophen  (TYLENOL ) 500 MG tablet Take 1,000 mg by mouth every 6 (six) hours as needed for mild pain, fever or headache.     albuterol  (PROAIR  HFA) 108 (90 Base) MCG/ACT inhaler INHALE 2 PUFFS EVERY 6 HOURS AS NEEDED FOR SHORTNESS OF BREATH AND WHEEZING. 8.5 g 1   amoxicillin -clavulanate (AUGMENTIN ) 875-125 MG tablet Take 1 tablet by mouth every 12 (twelve) hours. 14 tablet 0   atomoxetine  (STRATTERA ) 10 MG capsule Take 10 mg by mouth daily.     azithromycin  (ZITHROMAX ) 250 MG tablet Take 1 tablet (250 mg total) by mouth daily. Take first  2 tablets together, then 1 every day until finished. 6 tablet 0   budesonide -formoterol  (SYMBICORT ) 80-4.5 MCG/ACT inhaler Inhale 2 puffs into the lungs 2 (two) times daily. 80 g 2   busPIRone  (BUSPAR ) 15 MG tablet Take 1 tablet (15 mg total) by mouth 3 (three) times daily. 90 tablet 0   clonazePAM  (KLONOPIN ) 0.5 MG tablet Take 1 tablet (0.5 mg total) by mouth 3 (three) times daily as needed for anxiety. (Patient taking differently: Take 0.5 mg by mouth 3 (three) times daily.) 90 tablet 1   cyanocobalamin  (VITAMIN B12) 1000 MCG tablet Take 1,000 mcg by mouth daily.     estradiol  (ESTRACE ) 0.1 MG/GM vaginal cream Place 1 Applicatorful vaginally at bedtime.     fluticasone  (FLONASE ) 50 MCG/ACT nasal spray Place 2 sprays into both nostrils daily. (Patient taking differently: Place 2 sprays into both nostrils daily as needed for allergies.) 16 g 6   gabapentin  (NEURONTIN ) 800 MG tablet Take 0.5 tablets (400 mg total) by mouth in the morning, at noon, in  the evening, and at bedtime.     hydrOXYzine  (ATARAX /VISTARIL ) 50 MG tablet Take 50 mg by mouth at bedtime.     lidocaine  (LIDODERM ) 5 % Place 1 patch onto the skin daily. Remove & Discard patch within 12 hours or as directed by MD 15 patch 0   lipase/protease/amylase (CREON ) 36000 UNITS CPEP capsule Take 1 capsule (36,000 Units total) by mouth 4 (four) times daily -  with meals and at bedtime. 180 capsule 7   Magnesium  250 MG TABS Take 250 mg by mouth daily.     metoprolol  tartrate (LOPRESSOR ) 25 MG tablet Take 0.5 tablets (12.5 mg total) by mouth 2 (two) times daily. (Patient taking differently: Take 25 mg by mouth 2 (two) times daily.) 14 tablet 0   OLANZapine  (ZYPREXA ) 5 MG tablet Take 1 tablet (5 mg total) by mouth daily. 30 tablet 0   omeprazole  (PRILOSEC) 40 MG capsule Take 1 capsule (40 mg total) by mouth 2 (two) times daily. 120 capsule 5   ondansetron  (ZOFRAN ) 4 MG tablet Take 1 tablet (4 mg total) by mouth every 8 (eight) hours as needed for nausea or vomiting. 30 tablet 1   oxybutynin  (DITROPAN  XL) 15 MG 24 hr tablet Take 15 mg by mouth daily.     Oxycodone  HCl 10 MG TABS Take 1-1.5 tablets (10-15 mg total) by mouth every 6 (six) hours as needed for breakthrough pain. 25 tablet 0   promethazine  (PHENERGAN ) 12.5 MG tablet Take 2 tablets (25 mg total) by mouth every 6 (six) hours as needed for nausea or vomiting. 30 tablet 1   promethazine  (PHENERGAN ) 25 MG tablet Take 0.5 tablets (12.5 mg total) by mouth every 6 (six) hours as needed for nausea or vomiting. 20 tablet 0   QUEtiapine  (SEROQUEL ) 400 MG tablet Take 800 mg by mouth at bedtime.     RESTASIS  0.05 % ophthalmic emulsion Place 1 drop into both eyes 2 (two) times daily.     traZODone  (DESYREL ) 150 MG tablet Take 150 mg by mouth at bedtime.     No current facility-administered medications for this visit.    Allergies as of 09/27/2023 - Review Complete 09/27/2023  Allergen Reaction Noted   Abilify  [aripiprazole ] Swelling,  Palpitations, and Other (See Comments) 12/28/2011   Amitriptyline Anaphylaxis 12/06/2012   Metoclopramide hcl Other (See Comments)    Propoxyphene Rash 02/23/2015   Toradol  [ketorolac  tromethamine ]  03/20/2020   Tramadol  Swelling, Other (See Comments), and Rash 12/28/2011  Ambien  [zolpidem  tartrate] Other (See Comments) 12/28/2011   Eszopiclone Other (See Comments) 06/21/2011   Fentanyl  Itching 04/02/2022   Nicoderm [nicotine ]  02/12/2023   Varenicline  Other (See Comments) 06/19/2013   Buprenorphine hcl Itching and Hives 05/12/2014   Demerol [meperidine] Rash 06/15/2013   Emetrol Hives, Itching, and Rash 06/19/2013   Morphine and codeine  Hives and Itching 12/28/2011    Social History   Socioeconomic History   Marital status: Divorced    Spouse name: Not on file   Number of children: 2   Years of education: Not on file   Highest education level: Not on file  Occupational History    Employer: UNEMPLOYED  Tobacco Use   Smoking status: Every Day    Current packs/day: 1.00    Average packs/day: 1 pack/day for 14.0 years (14.0 ttl pk-yrs)    Types: Cigarettes   Smokeless tobacco: Never  Vaping Use   Vaping status: Former  Substance and Sexual Activity   Alcohol  use: Not Currently    Comment: last drink 3 weeks ago; recently released from rehab   Drug use: Yes    Types: Marijuana    Comment: once a month   Sexual activity: Not on file  Other Topics Concern   Not on file  Social History Narrative   Homemaker   Daily Caffeine Use-Mtn. Dew         Social Drivers of Corporate investment banker Strain: Not on file  Food Insecurity: Not on file  Transportation Needs: Not on file  Physical Activity: Not on file  Stress: Not on file  Social Connections: Not on file    Review of systems General: negative for malaise, night sweats, fever, chills +weight loss  Neck: Negative for lumps, goiter, pain and significant neck swelling Resp: Negative for cough, wheezing, dyspnea  at rest CV: Negative for chest pain, leg swelling, palpitations, orthopnea GI: denies melena, hematochezia, vomiting, constipation, odyonophagia, early satiety +diarrhea +nausea +weight loss +dysphagia  MSK: Negative for joint pain or swelling, back pain, and muscle pain. Derm: Negative for itching or rash Psych: Denies depression, anxiety, memory loss, confusion. No homicidal or suicidal ideation.  Heme: Negative for prolonged bleeding, bruising easily, and swollen nodes. Endocrine: Negative for cold or heat intolerance, polyuria, polydipsia and goiter. Neuro: negative for tremor, gait imbalance, syncope and seizures. The remainder of the review of systems is noncontributory.  Physical Exam: BP 101/65   Pulse 94   Temp 98.3 F (36.8 C)   Ht 5' 4 (1.626 m)   Wt 81 lb 14.4 oz (37.1 kg)   BMI 14.06 kg/m  General:   Alert and oriented. No distress noted. Pleasant and cooperative. Cachectic, malnourished   Head:  Normocephalic and atraumatic. Eyes:  Conjuctiva clear without scleral icterus. Mouth:  Oral mucosa pink and moist. Good dentition. No lesions. Heart: Normal rate and rhythm, s1 and s2 heart sounds present.  Lungs: Clear lung sounds in all lobes. Respirations equal and unlabored. Abdomen:  +BS, soft, and non-distended. Abdomen is generally TTP. No rebound or guarding. No HSM or masses noted. Derm: No palmar erythema or jaundice Msk:  Symmetrical without gross deformities. Normal posture. Extremities:  Without edema. Neurologic:  Alert and  oriented x4 Psych:  Alert and cooperative. Normal mood and affect.  Invalid input(s): 6 MONTHS   ASSESSMENT: [name] is a 48 y.o. female presenting today for follow up of severe protein calorie malnutrition, EPI, nausea, dysphagia, IBS   Severe protein calorie malnutrition: previously  had GJ tube but requested removal due to ongoing infections and inability to utilize this due to her living situation. Appears per chart review this  was removed by Dr. Kallie in November. Since then she has continued to have weight loss, inability to eat, she is requesting to have feeding tube replaced. I discussed with her that we do not place feeding tubes here, would need to refer to IR for this.   IBS/EPI: previously had improvement in stooling with creon  though now endorses diarrhea again.  I suspect her diarrhea is a component of her other GI issues, likely a large component of psychiatric and polypharmacy influence here. Unfortunately I do not have room to increase her Creon  due to her low weight.   GERD/Dysphagia:GERD mostly well controlled on omeprazole  40mg  BID. She continues to have some dysphagia, EGD last July with no abnormalities to explain her symptoms and notes no improvement since. MBSS with speech therapy indicated possibly some food aversion, anxiety contributing to her dysphagia. May benefit from esophageal manometry for further evaluation as there could be some dysmotility at play here given her history of gastroparesis. Will consider referral for this at follow up after Feeding tube is hopefully replaced and her calorie intake is improved.    PLAN:  Continue creon  36k units with meals -continue phenergan  12.5mg  q6h PRN -zofran  4mg  Q8H PRN  -continue omeprazole  40mg  BID  -referral to IR for feeding tube placement -consider referral for esophageal manometry at follow up if dysphagia persists   All questions were answered, patient verbalized understanding and is in agreement with plan as outlined above.   Follow Up: 4 months  Willis Kuipers L. Mariette, MSN, APRN, AGNP-C Adult-Gerontology Nurse Practitioner [facility]  I have reviewed the note and agree with the APP's assessment as described in this progress note  We will hold off on manometry for now as if she has a GJ tube placement again, there will be too much of a benefit from evaluation of the esophageal motility.  Toribio Fortune,  MD Gastroenterology and Hepatology [facility]

## 2023-09-30 NOTE — Progress Notes (Signed)
 History of Present Illness: [name] is a 48 y.o. female who presents today for follow up. History of interstitial cystitis, urine retention, IBS, migraine headache, fibromyalgia, chronic pain, G-tube placement due to difficulty feeding secondary to history of chronic pancreatitis, COPD and bipolar disorder. Pt was last seen on 03/02/23. Plan includes; Continue with current program Recommend nystatin cream for skin yeast infection in the abdomen, around the PEG tube.  Follow-up in 4 weeks or as needed for SNM device programming. Pt was advised to charge device programmer prior to next visit.     Today She reports difficulty voiding. She now perform CIC 3 x per .   Bladder pain is described as 7/10, it's constant, managed with pain medication at this time. She urinates 2 times per day by herself. She will like to discuss cystoscopy with hydrodistention with physician. She reports planning for peg tube placement.    Current treatment regimen includes:  - Klonopin  (#90 last dispensed on 11/10/22 per PMP aware controlled substance registry) as prescribed elsewhere    - Flexeril     - Gabapentin  (#120 last dispensed on 11/05/22 per PMP aware controlled substance registry) as prescribed elsewhere    - Norco  (#120 last dispensed on 10/25/22 per PMP aware controlled substance registry) as prescribed elsewhere    - Hydroxyzine     - hyoscyamine     - Trazodone     - SNM device implant (revision) on 02/16/23.     Fall Screening: Do you usually have a device to assist in your mobility? No    Medications:  Current Outpatient Medications  Medication Sig Dispense Refill  . albuterol  HFA (Ventolin  HFA) 90 mcg/actuation inhaler Inhale 2 puffs every 4 (four) hours as needed.    . budesonide -formoteroL  (Symbicort ) 80-4.5 mcg/actuation inhaler Inhale 2 puffs 2 (two) times a day.    SABRA buPROPion (WELLBUTRIN) 75 mg tablet Take 75 mg by mouth 2 (two) times a day.    . cephALEXin  (KEFLEX ) 250 mg capsule  Take 250 mg by mouth daily.    . clonazePAM  (KlonoPIN ) 0.5 mg tablet Take 1 tab 3 times a day 90 tablet 2  . conjugated estrogens (PREMARIN) 0.625 mg/gram vaginal cream Insert 1 g into the vagina daily. Discard plastic applicator. Insert a blueberry size amount (approximately 1 gram) of cream on fingertip inside vagina at bedtime every night for 1 week then 2 nights per week for long term use. 30 g 3  . cyanocobalamin  (VITAMIN B12) 1,000 mcg tablet Take 1,000 mcg by mouth daily.    . cycloSPORINE  (Restasis ) 0.05 % ophthalmic emulsion Administer 1 drop into each eyes 2 (two) times a day.    . ergocalciferol  (VITAMIN D2) 1,250 mcg (50,000 unit) capsule Take 50,000 Units by mouth once a week.    . estradioL  (VIVELLE -DOT) 0.0375 mg/24 hr Place 1 patch on the skin 2 (two) times a week. 24 patch 3  . fluticasone  propionate (FLONASE ) 50 mcg/spray nasal spray Administer 1 spray into each nostril daily as needed for rhinitis or allergies.    . gabapentin  (NEURONTIN ) 800 mg tablet Take 1 tab twice during the day and 2 at night 120 tablet 2  . hydrOXYzine  (ATARAX ) 25 mg tablet     . hyoscyamine  (LEVSIN /SL) 0.125 mg sublingual tablet TAKE 1 TABLET EVERY 4 HOURS AS NEEDED FOR CRAMPING FOR UP TO 10 DAYS 30 tablet 0  . magnesium  oxide (MAG-OX) 250 mg tablet Take 250 mg by mouth daily.    . metoprolol  tartrate (LOPRESSOR ) 25 mg  tablet Take 12.5 mg by mouth 2 (two) times a day.    SABRA MISCELLANEOUS MEDICATION/PRODUCT Pt has been advised to not wear a seatbelt  She has chronic bladder and pelvic pain due to Interstitial Cystitis and this is exacerbated by a lap belt pushing on the bladder 1 each 0  . naloxone  (NARCAN ) 4 mg/actuation spry nasal spray as needed.    . nystatin (MYCOSTATIN) 100,000 unit/gram cream Apply topically 2 (two) times a day. Apply to the affected areas twice daily or as indicated until healing is complete. 30 g 0  . OLANZapine  (ZyPREXA ) 5 mg tablet Take 1 tab daily 30 tablet 2  . omega  3-dha-epa-fish oil (OMEGA 3) 1,000 mg DR capsule Take 2 capsules by mouth daily.    . omeprazole  (PriLOSEC) 40 mg DR capsule Take 40 mg by mouth daily.    . ondansetron  (ZOFRAN ) 4 mg tablet take 1 tablet by mouth every 8 hours as needed for up to 30 days for nausea or vomiting. 30 tablet 0  . ondansetron  (ZOFRAN -ODT) 8 mg disintegrating tablet Dissolve 8 mg on tongue as needed.    . oxyCODONE  (ROXICODONE ) 10 mg tab Take 1 tablet by mouth every 6 (six) hours as needed for pain.    . pancrelipase , Lip-Prot-Amyl, (CREON -36) 36,000-114,000- 180,000 unit capsule Take 1 capsule by mouth in the morning and 1 capsule at noon and 1 capsule in the evening. Take with meals. 300 capsule 3  . pantoprazole  (PROTONIX ) 40 mg EC tablet Take 40 mg by mouth daily.    . promethazine  (PHENERGAN ) 25 mg tablet     . QUEtiapine  (SEROquel ) 400 mg tablet Take 1 tab at night 30 tablet 5  . traZODone  (DESYREL ) 150 mg tablet     . metroNIDAZOLE  (FLAGYL ) 500 mg tablet Take 1 tablet (500 mg total) by mouth 2 (two) times a day for 7 days. (Patient not taking: Reported on 10/04/2023) 14 tablet 0  . naproxen  (NAPROSYN ) 500 mg tablet Take 1 tablet (500 mg total) by mouth in the morning and 1 tablet (500 mg total) in the evening. Take with meals. Do all this for 7 days. (Patient not taking: Reported on 10/04/2023) 14 tablet 0  . tamsulosin  (FLOMAX ) 0.4 mg cap Take 1 capsule (0.4 mg total) by mouth daily. 30 capsule 5   No current facility-administered medications for this visit.     Allergies:  Allergies  Allergen Reactions  . Aripiprazole  Palpitations    Other reaction(s): Other (See Comments), Palpitations, Swelling, tremors, Throat swelling, tremors, tremors  . Fentanyl  Other (See Comments)    weird feeling  and uncontrollable shaking  . Metoclopramide Other (See Comments)    Seizures  . Metoclopramide Hcl Itching    Causes seizures  . Propoxyphene Rash    Other reaction(s): Rash  . Propoxyphene N-Acetaminophen  Rash     Other reaction(s): Hives, Itching, Rash, Other reaction(s): Rash (ALLERGY/intolerance)  . Tramadol  Rash    Other reaction(s): Other (See Comments), Rash, Swelling, Throat swelling, tremors  . Zolpidem  Other (See Comments)    hallucinations  . Eszopiclone Itching    Hallucinations, hyper, bad taste in mouth  . Amitriptyline Angioedema  . Morphine Itching  . Nicotine    . Holland [Ubrogepant]   . Varenicline  Other (See Comments)    Suicidal thoughts  . Buprenorphine Hcl Hives and Itching    Other reaction(s): Hives, Itching  . Ketorolac  Tromethamine  Palpitations    tachycardia  . Meperidine Rash    Other reaction(s): Rash  .  Phosphorated Carbohydrate Hives, Itching and Rash    Other reaction(s): Hives, Itching, Rash, Other reaction(s): Rash (ALLERGY/intolerance), Other reaction(s): Rash (ALLERGY/intolerance)     Past Medical History:  Diagnosis Date  . Abnormal Pap smear of cervix   . Acute bronchitis with chronic obstructive pulmonary disease (COPD)    (CMD)   . Adrenal mass (CMD)   . Anxiety   . Asthma (CMD)   . Bipolar affective    (CMD)   . Carcinoma in situ of bladder   . Chronic back pain   . Chronic obstructive pulmonary disease    (CMD) 03/08/2011  . Cystitis   . Depression   . Dysuria   . Fibromyalgia   . Flank pain   . Gastroesophageal reflux disease 03/08/2011  . History of colonic polyps   . History of irritable bowel syndrome   . History of polycystic ovarian syndrome   . Nocturia   . Personal history of endometriosis   . PONV (postoperative nausea and vomiting)   . Sciatica    Right leg   . Small bowel obstruction    (CMD)   . Tobacco use 03/08/2011  . Ureteral obstruction   . Ureteral stone   . Urgency of urination   . Urinary frequency   . Urinary tract infection   . Urine retention   . Vaginal infection   . Vaginal pain      Past Surgical History:  Procedure Laterality Date  . ABDOMINAL HYSTERECTOMY  2006   Procedure: ABDOMINAL  HYSTERECTOMY; ?endometriosis and enlarged uterus  . ABDOMINAL SURGERY      Procedure: ABDOMINAL SURGERY; H/O multiple surgeries.  . ABDOMINAL SURGERY      Procedure: ABDOMINAL SURGERY; Multiple for endometriosis  . BILATERAL SALPINGOOPHORECTOMY  2006   Procedure: BILATERAL SALPINGOOPHORECTOMY  . BLADDER SURGERY Right 02/16/2023   EXCHANGE INTERSTIM FULL ([employer]) Removal and Replacement performed by Lamar JINNY Sar, MD at [facility] OR  . CHOLECYSTECTOMY     Procedure: CHOLECYSTECTOMY  . COLONOSCOPY     Procedure: COLONOSCOPY  . CYSTOSCOPY W/ DILATION OF BLADDER  05/17/2012   Procedure: CYSTOSCOPY W/ HYDRODISTENTION;  Surgeon: Lamar Lynwood Sar, MD;  Location: [facility] OUTPATIENT OR;  Service: Urology;  Laterality: N/A;  . CYSTOSCOPY W/ DILATION OF BLADDER N/A 03/31/2013   Procedure: CYSTOSCOPY W/ HYDRODISTENTION;  Surgeon: Lamar Lynwood Sar, MD;  Location: [facility] OUTPATIENT OR;  Service: Urology;  Laterality: N/A;  . CYSTOSCOPY W/ DILATION OF BLADDER N/A 07/17/2016   Procedure: CYSTOSCOPY W/ HYDRODISTENTION;  Surgeon: Lamar Lynwood Sar, MD;  Location: [facility] OUTPATIENT OR;  Service: Urology;  Laterality: N/A;  . CYSTOSCOPY W/ DILATION OF BLADDER N/A 07/02/2017   Procedure: CYSTOSCOPY W/ HYDRODISTENTION;  Surgeon: Lamar Lynwood Sar, MD;  Location: [facility] OUTPATIENT OR;  Service: Urology;  Laterality: N/A;  . CYSTOSCOPY W/ DILATION OF BLADDER N/A 07/18/2019   Procedure: CYSTOSCOPY W/ HYDRODISTENTION;  Surgeon: Lamar Lynwood Sar, MD;  Location: [facility];  Service: Urology;  Laterality: N/A;  . CYSTOSCOPY W/ DILATION OF BLADDER N/A 07/09/2020   Procedure: CYSTOSCOPY W/ HYDRODISTENTION / BOTOX INJECTION;  Surgeon: Lamar Lynwood Sar, MD;  Location: [facility] MAIN OR;  Service: Urology;  Laterality: N/A;  . CYSTOSCOPY W/ DILATION OF BLADDER N/A 02/11/2021   Procedure: CYSTOSCOPY W/ HYDRODISTENTION / BOTOX INJECTION;  Surgeon: Lamar Lynwood Sar, MD;  Location: [facility] MAIN OR;  Service: Urology;  Laterality: N/A;  . CYSTOSCOPY  W/ DILATION OF BLADDER N/A 02/17/2022   Procedure: CYSTOSCOPY W/ HYDRODISTENTION / BOTOX INJECTION;  Surgeon:  Janit Lamar Agent, MD;  Location: [facility] MAIN OR;  Service: Urology;  Laterality: N/A;  . CYSTOSCOPY W/ URETERAL STENT REMOVAL     Procedure: CYSTOSCOPY W/ URETERAL STENT REMOVAL  . ELECTROCONVULSIVE THERAPY N/A 04/26/2022   Procedure: ELECTROCONVULSIVE THERAPY;  Surgeon: Lesia Kathleen Shillings, MD;  Location: [facility] MAIN OR;  Service: ECT;  Laterality: N/A;  . ELECTROCONVULSIVE THERAPY N/A 04/28/2022   Procedure: ELECTROCONVULSIVE THERAPY;  Surgeon: Lesia Kathleen Shillings, MD;  Location: [facility] MAIN OR;  Service: ECT;  Laterality: N/A;  . ELECTROCONVULSIVE THERAPY N/A 05/01/2022   Procedure: ELECTROCONVULSIVE THERAPY;  Surgeon: Lesia Kathleen Shillings, MD;  Location: [facility] MAIN OR;  Service: ECT;  Laterality: N/A;  . ELECTROCONVULSIVE THERAPY N/A 05/03/2022   Procedure: ELECTROCONVULSIVE THERAPY;  Surgeon: Lesia Kathleen Shillings, MD;  Location: [facility] MAIN OR;  Service: ECT;  Laterality: N/A;  . ELECTROCONVULSIVE THERAPY N/A 05/05/2022   Procedure: ELECTROCONVULSIVE THERAPY;  Surgeon: Lesia Kathleen Shillings, MD;  Location: [facility] MAIN OR;  Service: ECT;  Laterality: N/A;  . ELECTROCONVULSIVE THERAPY N/A 05/08/2022   Procedure: ELECTROCONVULSIVE THERAPY;  Surgeon: Lesia Kathleen Shillings, MD;  Location: [facility] MAIN OR;  Service: ECT;  Laterality: N/A;  . ELECTROCONVULSIVE THERAPY N/A 05/10/2022   Procedure: ELECTROCONVULSIVE THERAPY;  Surgeon: Lesia Kathleen Shillings, MD;  Location: [facility] MAIN OR;  Service: ECT;  Laterality: N/A;  . ELECTROCONVULSIVE THERAPY N/A 05/12/2022   Procedure: ELECTROCONVULSIVE THERAPY;  Surgeon: Lesia Kathleen Shillings, MD;  Location: [facility] MAIN OR;  Service: ECT;  Laterality: N/A;  . ELECTROCONVULSIVE THERAPY N/A 05/23/2022   Procedure: ELECTROCONVULSIVE THERAPY;  Surgeon: Thedford Fairy Sieving, MD;  Location: [facility] MAIN OR;  Service: ECT;  Laterality: N/A;  .  ELECTROCONVULSIVE THERAPY N/A 05/30/2022   Procedure: ELECTROCONVULSIVE THERAPY;  Surgeon: Nicolas Agent Dickens, MD;  Location: [facility] MAIN OR;  Service: ECT;  Laterality: N/A;  . ELECTROCONVULSIVE THERAPY N/A 06/05/2022   Procedure: ELECTROCONVULSIVE THERAPY;  Surgeon: Lesia Arthea All, MD;  Location: [facility] MAIN OR;  Service: ECT;  Laterality: N/A;  . ESOPHAGOGASTRODUODENOSCOPY     Procedure: ESOPHAGOGASTRODUODENOSCOPY  . HYSTERECTOMY      Procedure: HYSTERECTOMY  . INTERSTIM GENERATOR PLACEMENT N/A 06/10/2021   Procedure: RENNA PLACEMENT STAGE 1 & 2-[employer];  Surgeon: Lamar Agent Janit, MD;  Location: [facility] MAIN OR;  Service: Urology;  Laterality: N/A;  . INTERSTIM IMPLANT REMOVAL Left 11/14/2019   Procedure: INTERSTIM IMPLANT REMOVAL;  Surgeon: Lamar Agent Janit, MD;  Location: [facility];  Service: Urology;  Laterality: Left;  . INTERSTIM REVISION  10/30/2011   Procedure: RENNA REVISION;  Surgeon: Lamar Agent Janit, MD;  Location: [facility] MAIN OR;  Service: Urology;  Laterality: N/A;  . INTERSTIM REVISION Left 06/26/2014   Procedure: INTERSTIM REVISION lead possible battery;  Surgeon: Lamar Agent Janit, MD;  Location: [facility] OUTPATIENT OR;  Service: Urology;  Laterality: Left;  Will need xray  . OTHER SURGICAL HISTORY     Procedure: OTHER SURGICAL HISTORY (bladder infustions); for interstitial cystitis  . SPINAL CORD STIMULATOR IMPLANT     Procedure: SPINAL CORD STIMULATOR IMPLANT; for treatment of chronic pain  . URETERAL STENT PLACEMENT     Procedure: URETERAL STENT PLACEMENT    Family History  Problem Relation Name Age of Onset  . Coronary artery disease Father    . Colon cancer Neg Hx    . Anesthesia problems Neg Hx    . Breast cancer Neg Hx      Social History   Socioeconomic History  . Marital status: Divorced    Spouse name:  Not on file  . Number of children: Not on file  . Years of education: Not on file  . Highest education level: Not on file  Occupational History   . Not on file  Tobacco Use  . Smoking status: Every Day    Current packs/day: 0.50    Average packs/day: 0.5 packs/day for 24.5 years (12.2 ttl pk-yrs)    Types: Cigarettes    Start date: 04/11/1999  . Smokeless tobacco: Never  Vaping Use  . Vaping status: Never Used  Substance and Sexual Activity  . Alcohol  use: Not Currently    Comment: 5 yrs sober  . Drug use: Not on file    Comment: last dose approx 2 weeks ago  . Sexual activity: Not on file  Other Topics Concern  . Not on file  Social History Narrative  . Not on file   Social Drivers of Health   Food Insecurity: Not on file  Transportation Needs: Not on file  Safety: Not on file  Living Situation: Not on file      SUBJECTIVE  Review of Systems Constitutional:  Patient denies any unintentional weight loss or change in strength Integumentary:  Patient denies any rashes or pruritus Eyes:  Patient denies double vision or eye pain Ears/Nose/Mouth/Throat:  Patient denies any nosebleeds or gum bleeding Cardiovascular:  Patient denies chest pain or syncope Respiratory: Patient denies hemoptysis  Gastrointestinal:  Patient denies nausea, vomiting, constipation, or diarrhea Musculoskeletal:  Patient denies muscle cramps or weakness Neurologic: Patient denies convulsions or seizures Psychiatric: Patient denies hallucinations or suicidal ideations Allergic/Immunologic: Patient denies food allergies Hematologic/Lymphatic: Patient denies bleeding tendencies Endocrine:  Patient denies heat/cold intolerance  GU: As per HPI.   OBJECTIVE Vitals:   10/04/23 1331  BP: 93/70  Pulse: 77  Temp: 98.5 F (36.9 C)     Body mass index is 13.39 kg/m.  Physical Examination  Constitutional:  No obvious distress Cardiovascular: Pulse is regular Respiratory: The patient does not have audible wheezing/stridor; respirations do not appear labored Gastrointestinal: Abdomen non-distended Musculoskeletal: Normal ROM of UEs Skin:  No obvious rashes/open sores Neurologic: CN 2-12 grossly intact Psychiatric: Answered questions appropriately w/normal affect Hematologic/Lymphatic/Immunologic: No obvious bruises or sites of spontaneous bleeding Genitourinary: No CVA tenderness, bladder is not palpable.   UA: Positive for trace blood; small leukocyte PVR: 16 mL    ASSESSMENT 1) IC 2) Urinary frequency/urgency 3) Urine retention 4) S/p SNM device implant   InterStim  - Indications: Urine retention and OAB - Date of Placement: 02/16/23 ([employer]) by Dr. Janit - Impedance: Good - Battery Life: Okay - Patient has been on Program #3 for 100% of the time at 1.5 mA, stimulation felt in the vagina at 2.0 mA. XRay: Not indicated   We discussed pt's concern and needed management. Pt did not charge device programmer and did not bring the charger. We tried and was able to find chargers. We charged device briefly and advised pt to always ensure that programmers are well charged prior to clinic visit. Device was interrogated and leads are intact. Pt brought the old Consulting civil engineer for rechargeable device that was removed. It was collected, will be given to [employer] rep.   Patient was informed that Dr. Janit is on medical leave and pt can be scheduled with a different physician for cysto w/hod. However pt will like to meet with the physician first prior to schedule for cysto w/hod.  For urinary retention, advised on use of flomax  and monitor for symptoms improvement, pt  reports to have used the flomax  in the past and it was helpful. All questions were answered.  PLAN Advised the following: Urine culture sent Continue with current program Continue with CIC as needed Recommend flomax  0.4 mg daily for management of retention. Follow-up with Dr. Margart Sierras to discuss cystoscopy with hydrodistention and further evaluation.     Orders Placed This Encounter  Procedures  . Surgical Urine Culture, Sterile Collection    Standing  Status:   Future    Number of Occurrences:   1    Expected Date:   10/04/2023    Expiration Date:   10/03/2024  . Urinalysis without Microscopic    Standing Status:   Future    Number of Occurrences:   1    Expected Date:   10/04/2023    Expiration Date:   10/03/2024  . Urinalysis, Microscopic Only    Standing Status:   Future    Number of Occurrences:   1    Expected Date:   10/04/2023    Expiration Date:   10/03/2024     Return for f/u with Dr. Margart Sierras.    Electronically signed by: Oluwatoyin Alaba Fadeyi, DNP 10/04/2023 5:41 PM

## 2023-10-01 ENCOUNTER — Telehealth (INDEPENDENT_AMBULATORY_CARE_PROVIDER_SITE_OTHER): Payer: Self-pay | Admitting: *Deleted

## 2023-10-01 ENCOUNTER — Other Ambulatory Visit (INDEPENDENT_AMBULATORY_CARE_PROVIDER_SITE_OTHER): Payer: Self-pay | Admitting: Gastroenterology

## 2023-10-01 DIAGNOSIS — K3184 Gastroparesis: Secondary | ICD-10-CM

## 2023-10-01 DIAGNOSIS — E43 Unspecified severe protein-calorie malnutrition: Secondary | ICD-10-CM

## 2023-10-01 NOTE — Telephone Encounter (Signed)
-----   Message from Mitzie LITTIE Boettcher sent at 10/01/2023 11:31 AM EDT ----- Jenkins, can we refer the patient to IR for GJ (feeding tube) placement? Diagnosis is severe protein calorie malnutrition/gastroparesis

## 2023-10-01 NOTE — Telephone Encounter (Signed)
 Referral sent, they will contact patient with apt

## 2023-10-01 NOTE — Progress Notes (Signed)
 Referral sent, they will contact patient with apt

## 2023-10-04 ENCOUNTER — Telehealth (INDEPENDENT_AMBULATORY_CARE_PROVIDER_SITE_OTHER): Payer: Self-pay | Admitting: Gastroenterology

## 2023-10-04 ENCOUNTER — Other Ambulatory Visit (INDEPENDENT_AMBULATORY_CARE_PROVIDER_SITE_OTHER): Payer: Self-pay | Admitting: Gastroenterology

## 2023-10-04 NOTE — Telephone Encounter (Signed)
 Pt contacted. Pt states she has managed her feedings at home in the past and doesn't need help with feedings. Advised pt for the replacement she would need to get in touch with Dr.Honor at [facility]. Pt states Dr.Honor is out on maternity leave. Advised pt that she may want to call Dr.Honor office and let them know what she is needing and see if she could see another provider. Pt states she will try to reach out to Dr.Honor office and let us  know.

## 2023-10-04 NOTE — Telephone Encounter (Signed)
 Pt left voicemail stating that she was not aware that we put in orders for her feeding tube to be replaced and would like a call back Returned call to pt. Clarified with pt that  since we do not have nutritionist on staff to manage her home feedings, we will need to hold off on being the ones to order replacement of her GJ tube, she will need to [facility] as this is who managed her tube and feedings previously, appears she is still established with them. Pt states she called Dr.Honor office and they told her that since our office had put in the orders/referral for they would not be able to do it. Pt states she could see a nutritionist somewhere else.  Please advise. Thank you.

## 2023-10-04 NOTE — Telephone Encounter (Signed)
-----   Message from Mitzie LITTIE Boettcher sent at 10/03/2023  1:21 PM EDT ----- Jenkins,  I discussed case with Dr. Eartha, he advised since we do not have nutritionist on staff to manage her home feedings, we will need to hold off on being the ones to order replacement of her GJ tube, she will need to see Dr. L[facility] at [facility] as this is who managed her tube and feedings previously, appears she is still established with them  Glenys, can we let the patient know the above and have her reach out to Dr. Jacolyn office again for replacement/management of feeding tube as we do not have the resources to manage this here. ----- Message ----- From: Blanca Jenkins DEL Sent: 10/01/2023   1:24 PM EDT To: Mitzie LITTIE Boettcher, NP

## 2023-10-04 NOTE — Telephone Encounter (Signed)
 Pt contacted. Pt states she will give Dr.Honor's office a call. Advised pt that if Dr.Honor's office is needing any kind of information, they could call or fax and we would be happy to send over requested information. Pt verbalized understanding.

## 2023-10-05 NOTE — Telephone Encounter (Signed)
 Pt called in and states that Dr.Honor office needs to speak with [facility] ASAP. Crystal also had a voicemail on the shared line from [place] at [employer] stating she would like to speak with Mitzie Boettcher about patient [name] September 21, 1975. Callback number [phone]

## 2023-10-05 NOTE — Telephone Encounter (Signed)
 I spoke with [name], and made her aware of the questions Dr. Liza has She says we do not have the resources to place or manage this, and the patient is technically still their patient as she was seen by them last year.   I spoke with Delon the nurse at [facility]  [phone]. She says Dr. Liza would like to know why we referred the patient for the feeding tube placement and management to them as they have not seen the patient since a Tele visit from March of 2024, and in person since 2023. They say they would like a call from [facility] to Dr. Liza at [phone].

## 2023-10-05 NOTE — Telephone Encounter (Signed)
 I tried calling back to [place] the Rn there, but Burnard the receptionist states she could not get her to the phone currently, but she would send a message back to [place] and Dr. Liza that we did not have the resources or the capability to manage the placement and management of the feeding tube. I also let them know that if the patient was seen last year she is technically still their patient. She will let them both know this information and if they need anything further, I have given them my direct phone number here at the office to reach out if need be.

## 2023-10-05 NOTE — Telephone Encounter (Signed)
 Ok thank you. I could probably speak to her Monday afternoon around 4 pm. [phone]. Thanks

## 2023-10-08 NOTE — Telephone Encounter (Signed)
 I spoke with the patient and made her aware per [facility],  I connected with Dr. Liza, she is going to have her team reach out and try to schedule a visit with them to re evaluate for possible feeding tube placement as soon as possible, as I explained we do not have the ability to place the tube nor the resources such as nutrition on site to manage her feedings here. in the meantime she recommended if the patient continues to lose weight or is unable to eat that she proceed to the ER at [facility] for more urgent evaluation and possible tube placement there as she did before.   Patient aware of all and states understanding.

## 2023-10-08 NOTE — Telephone Encounter (Signed)
 [name] with [name] has called back regarding the patient and asked that you reach out to Dr. Liza after 4 pm today at [phone].   Patient called back to see if we had connected with Dr. Liza.

## 2023-10-08 NOTE — Telephone Encounter (Signed)
 Discussed with [name]. They do not think their IR team will place the feeding tube without her being established with dietitian and nutrition. I will need to see her in clinic prior to placing this tube. I can see her on 8/6 at 1030 am or if a there is a cancellation sooner. Will also need her to see Kelli. Discussed with Carlan if she is unable to eat or drink or losing weight rapidly would recommend she presents to the ER for more urgent feeding tube placement

## 2023-10-12 NOTE — ED Notes (Signed)
 RN at bedside.   Andres Gunner [facility] 10/12/23 1526

## 2023-10-12 NOTE — ED Provider Notes (Signed)
 ------------------------------------------------------------------------------- Attestation signed by Lindsay VEAR Severs, MD at 10/13/2023  9:30 PM  I have personally seen and examined the patient, and discussed the plan of care with Dr. Nada.  I have reviewed their documentation and agree with the history, physical, assessment, and medical decision making. Patient's presentation is most consistent with acute presentation with potential threat to life or bodily function.  I have personally viewed the imaging studies performed  Electronically signed by: Lindsay VEAR Severs, MD 10/13/2023 9:29 PM   -------------------------------------------------------------------------------  Emergency Department Provider Note  History  Chief Complaint:  Abdominal Pain  HPI  This is a 48 y.o. female with PMH significant for interstitial cystitis, urinary retention, IBS, fibromyalgia, chronic pain, history of G-tube dependence secondary to chronic pancreatitis with removal of G-tube due to housing instability, COPD and bipolar disorder who presents emergency department for worsening abdominal pain with decreased p.o. intake.  Patient states that she has been followed by GI in the outpatient setting with plans to have G-tube replaced in August.  Patient states that she was told if she was having worsening abdominal pain or decreased p.o. intake to present to the emergency department.  Patient states that she has gradually been having decreased p.o. intake and is unable to tolerate liquids or solids secondary to abdominal pain.  Patient states that due to decreased p.o. intake she has become significantly weaker than normal and is unable to perform ADLs    Pertinent ROS per HPI and MDM-ED Course  Medical History[1] Surgical History[2]  Physical Exam  Physical Exam Vitals and nursing note reviewed.  Constitutional:      General: She is in acute distress.     Appearance: She is underweight.  HENT:     Head:  Normocephalic and atraumatic.     Right Ear: External ear normal.     Left Ear: External ear normal.     Nose: Nose normal.   Eyes:     Pupils: Pupils are equal, round, and reactive to light.    Cardiovascular:     Rate and Rhythm: Normal rate and regular rhythm.     Heart sounds: Normal heart sounds. No murmur heard. Pulmonary:     Effort: Pulmonary effort is normal. No respiratory distress.     Breath sounds: Normal breath sounds.  Abdominal:     General: Bowel sounds are normal. There is no distension.     Palpations: Abdomen is soft.     Tenderness: There is generalized abdominal tenderness. There is no right CVA tenderness or left CVA tenderness.     Comments: Previous PEG tube scar well-healed without surrounding edema, erythema or purulent drainage   Musculoskeletal:        General: Normal range of motion.     Cervical back: Full passive range of motion without pain and normal range of motion.     Right lower leg: No edema.     Left lower leg: No edema.   Skin:    General: Skin is warm and dry.     Capillary Refill: Capillary refill takes less than 2 seconds.   Neurological:     General: No focal deficit present.     Mental Status: She is alert and oriented to person, place, and time. Mental status is at baseline.   Psychiatric:        Behavior: Behavior is cooperative.       ED Course - Medical Decision Making  ED Course: ED Course as of 10/13/23 [phone]  Fri  Oct 12, 2023  1439 interstitial cystitis, urine retention, IBS, migraine headache, fibromyalgia, chronic pain, G-tube placement due to difficulty feeding secondary to history of chronic pancreatitis, COPD and bipolar disorder [AG]  1516 S- ho fibromyalgia, chronic pancreatitis, PEG placement, homeless, GI. Plan for PEG tube outpatient. Non-focal tenderness [ ]  labs normal--> outpatient followup [ ]  electrolyte abnormalities--> admit [ ]   [BD]    ED Course User Index [AG] Lindsay Blondie Bolster, DO [BD]  Lindsay Thresa Leaks, MD    MDM [name] is a 48 y.o. female who presents with abdominal pain, decreased p.o. intake and generalized weakness as per above. I have reviewed the nursing documentation for past medical history, family history, and social history and agree.  I have reviewed the patient's vital signs. There are no significant abnormalities. Upon initial evaluation physical exam significant for female in mild distress secondary to abdominal pain.   Immediate Interventions: Based upon patients initial presentation, vital signs  and physical examination findings, will obtain laboratory studies.   Initial Assessment:  Differential diagnosis considered but not limited to: Failure to thrive , electrolyte abnormality, infection, bowel obstruction, pancreatitis  Testing Results interpreted by ED physician: Lab results: Laboratory studies pending at time of signout.   Therapeutic Interventions:  Medications  HYDROmorphone  (DILAUDID ) 1 mg/mL injection 0.5 mg (0.5 mg intravenous Given 10/12/23 1524)  ondansetron  (ZOFRAN ) injection 4 mg (4 mg intravenous Given 10/12/23 1624)  HYDROmorphone  (DILAUDID ) 1 mg/mL injection 0.5 mg (0.5 mg intravenous Given 10/12/23 1624)    All radiography studies, electrocardiograms, and laboratory data were personally reviewed by me and incorporated into my medical decision making.   History, physical examination, and objective data at this time most consistent with abdominal pain.  Upon initial evaluation patient is hemodynamically stable, afebrile and in distress secondary to abdominal pain.  Based upon patient's history and physical examination findings with evidence of decreased p.o. intake and generalized weakness, will obtain laboratory studies to assess for underlying metabolic abnormality.  Patient endorses significant abdominal pain however multiple allergies to pain medicines.  Will give patient 0.5 mg of Dilaudid .  At the time of signout,  laboratory studies pending.    Post-ED Care: Handoff: At the time of signout, the patients work-up had not yet been completed. Handoff was provided to Dr. Leaks , please see their note for additional treatment plan details.  This patient was staffed with Dr. Wyline who supervised the visit and agreed with the plan of care.   Impression   1. Generalized abdominal pain     ED Disposition     ED Disposition  Discharge   Condition  Stable   Comment  --         Note generated using Dragon voice dictation software  Electronically signed by:   [name], D.O. PGY2, Emergency Medicine   10/12/2023 2:38 PM      [1] Past Medical History: Diagnosis Date  . Abnormal Pap smear of cervix   . Acute bronchitis with chronic obstructive pulmonary disease (COPD)    (CMD)   . Adrenal mass (CMD)   . Anxiety   . Asthma (CMD)   . Bipolar affective    (CMD)   . Carcinoma in situ of bladder   . Chronic back pain   . Chronic obstructive pulmonary disease    (CMD) 03/08/2011  . Cystitis   . Depression   . Dysuria   . Fibromyalgia   . Flank pain   . Gastroesophageal reflux disease 03/08/2011  .  History of colonic polyps   . History of irritable bowel syndrome   . History of polycystic ovarian syndrome   . Nocturia   . Personal history of endometriosis   . PONV (postoperative nausea and vomiting)   . Sciatica    Right leg   . Small bowel obstruction    (CMD)   . Tobacco use 03/08/2011  . Ureteral obstruction   . Ureteral stone   . Urgency of urination   . Urinary frequency   . Urinary tract infection   . Urine retention   . Vaginal infection   . Vaginal pain   [2] Past Surgical History: Procedure Laterality Date  . ABDOMINAL HYSTERECTOMY  2006   Procedure: ABDOMINAL HYSTERECTOMY; ?endometriosis and enlarged uterus  . ABDOMINAL SURGERY      Procedure: ABDOMINAL SURGERY; H/O multiple surgeries.  . ABDOMINAL SURGERY      Procedure: ABDOMINAL SURGERY;  Multiple for endometriosis  . BILATERAL SALPINGOOPHORECTOMY  2006   Procedure: BILATERAL SALPINGOOPHORECTOMY  . BLADDER SURGERY Right 02/16/2023   EXCHANGE INTERSTIM FULL ([employer]) Removal and Replacement performed by Lamar JINNY Sar, MD at [facility] OR  . CHOLECYSTECTOMY     Procedure: CHOLECYSTECTOMY  . COLONOSCOPY     Procedure: COLONOSCOPY  . CYSTOSCOPY W/ DILATION OF BLADDER  05/17/2012   Procedure: CYSTOSCOPY W/ HYDRODISTENTION;  Surgeon: Lamar Lynwood Sar, MD;  Location: [facility] OUTPATIENT OR;  Service: Urology;  Laterality: N/A;  . CYSTOSCOPY W/ DILATION OF BLADDER N/A 03/31/2013   Procedure: CYSTOSCOPY W/ HYDRODISTENTION;  Surgeon: Lamar Lynwood Sar, MD;  Location: [facility] OUTPATIENT OR;  Service: Urology;  Laterality: N/A;  . CYSTOSCOPY W/ DILATION OF BLADDER N/A 07/17/2016   Procedure: CYSTOSCOPY W/ HYDRODISTENTION;  Surgeon: Lamar Lynwood Sar, MD;  Location: [facility] OUTPATIENT OR;  Service: Urology;  Laterality: N/A;  . CYSTOSCOPY W/ DILATION OF BLADDER N/A 07/02/2017   Procedure: CYSTOSCOPY W/ HYDRODISTENTION;  Surgeon: Lamar Lynwood Sar, MD;  Location: [facility] OUTPATIENT OR;  Service: Urology;  Laterality: N/A;  . CYSTOSCOPY W/ DILATION OF BLADDER N/A 07/18/2019   Procedure: CYSTOSCOPY W/ HYDRODISTENTION;  Surgeon: Lamar Lynwood Sar, MD;  Location: [facility];  Service: Urology;  Laterality: N/A;  . CYSTOSCOPY W/ DILATION OF BLADDER N/A 07/09/2020   Procedure: CYSTOSCOPY W/ HYDRODISTENTION / BOTOX INJECTION;  Surgeon: Lamar Lynwood Sar, MD;  Location: [facility] MAIN OR;  Service: Urology;  Laterality: N/A;  . CYSTOSCOPY W/ DILATION OF BLADDER N/A 02/11/2021   Procedure: CYSTOSCOPY W/ HYDRODISTENTION / BOTOX INJECTION;  Surgeon: Lamar Lynwood Sar, MD;  Location: [facility] MAIN OR;  Service: Urology;  Laterality: N/A;  . CYSTOSCOPY W/ DILATION OF BLADDER N/A 02/17/2022   Procedure: CYSTOSCOPY W/ HYDRODISTENTION / BOTOX INJECTION;  Surgeon: Sar Lamar Lynwood, MD;  Location: [facility] MAIN OR;  Service: Urology;   Laterality: N/A;  . CYSTOSCOPY W/ URETERAL STENT REMOVAL     Procedure: CYSTOSCOPY W/ URETERAL STENT REMOVAL  . ELECTROCONVULSIVE THERAPY N/A 04/26/2022   Procedure: ELECTROCONVULSIVE THERAPY;  Surgeon: Lesia Kathleen Shillings, MD;  Location: [facility] MAIN OR;  Service: ECT;  Laterality: N/A;  . ELECTROCONVULSIVE THERAPY N/A 04/28/2022   Procedure: ELECTROCONVULSIVE THERAPY;  Surgeon: Lesia Kathleen Shillings, MD;  Location: [facility] MAIN OR;  Service: ECT;  Laterality: N/A;  . ELECTROCONVULSIVE THERAPY N/A 05/01/2022   Procedure: ELECTROCONVULSIVE THERAPY;  Surgeon: Lesia Kathleen Shillings, MD;  Location: [facility] MAIN OR;  Service: ECT;  Laterality: N/A;  . ELECTROCONVULSIVE THERAPY N/A 05/03/2022   Procedure: ELECTROCONVULSIVE THERAPY;  Surgeon: Lesia Kathleen Shillings, MD;  Location: [facility]  MAIN OR;  Service: ECT;  Laterality: N/A;  . ELECTROCONVULSIVE THERAPY N/A 05/05/2022   Procedure: ELECTROCONVULSIVE THERAPY;  Surgeon: Lesia Kathleen Shillings, MD;  Location: [facility] MAIN OR;  Service: ECT;  Laterality: N/A;  . ELECTROCONVULSIVE THERAPY N/A 05/08/2022   Procedure: ELECTROCONVULSIVE THERAPY;  Surgeon: Lesia Kathleen Shillings, MD;  Location: [facility] MAIN OR;  Service: ECT;  Laterality: N/A;  . ELECTROCONVULSIVE THERAPY N/A 05/10/2022   Procedure: ELECTROCONVULSIVE THERAPY;  Surgeon: Lesia Kathleen Shillings, MD;  Location: [facility] MAIN OR;  Service: ECT;  Laterality: N/A;  . ELECTROCONVULSIVE THERAPY N/A 05/12/2022   Procedure: ELECTROCONVULSIVE THERAPY;  Surgeon: Lesia Kathleen Shillings, MD;  Location: [facility] MAIN OR;  Service: ECT;  Laterality: N/A;  . ELECTROCONVULSIVE THERAPY N/A 05/23/2022   Procedure: ELECTROCONVULSIVE THERAPY;  Surgeon: Thedford Fairy Sieving, MD;  Location: [facility] MAIN OR;  Service: ECT;  Laterality: N/A;  . ELECTROCONVULSIVE THERAPY N/A 05/30/2022   Procedure: ELECTROCONVULSIVE THERAPY;  Surgeon: Nicolas Lynwood Dickens, MD;  Location: [facility] MAIN OR;  Service: ECT;  Laterality: N/A;  .  ELECTROCONVULSIVE THERAPY N/A 06/05/2022   Procedure: ELECTROCONVULSIVE THERAPY;  Surgeon: Lesia Arthea All, MD;  Location: [facility] MAIN OR;  Service: ECT;  Laterality: N/A;  . ESOPHAGOGASTRODUODENOSCOPY     Procedure: ESOPHAGOGASTRODUODENOSCOPY  . HYSTERECTOMY      Procedure: HYSTERECTOMY  . INTERSTIM GENERATOR PLACEMENT N/A 06/10/2021   Procedure: RENNA PLACEMENT STAGE 1 & 2-[employer];  Surgeon: Lamar Lynwood Sar, MD;  Location: [facility] MAIN OR;  Service: Urology;  Laterality: N/A;  . INTERSTIM IMPLANT REMOVAL Left 11/14/2019   Procedure: INTERSTIM IMPLANT REMOVAL;  Surgeon: Lamar Lynwood Sar, MD;  Location: [facility];  Service: Urology;  Laterality: Left;  . INTERSTIM REVISION  10/30/2011   Procedure: RENNA REVISION;  Surgeon: Lamar Lynwood Sar, MD;  Location: [facility] MAIN OR;  Service: Urology;  Laterality: N/A;  . INTERSTIM REVISION Left 06/26/2014   Procedure: INTERSTIM REVISION lead possible battery;  Surgeon: Lamar Lynwood Sar, MD;  Location: [facility] OUTPATIENT OR;  Service: Urology;  Laterality: Left;  Will need xray  . OTHER SURGICAL HISTORY     Procedure: OTHER SURGICAL HISTORY (bladder infustions); for interstitial cystitis  . SPINAL CORD STIMULATOR IMPLANT     Procedure: SPINAL CORD STIMULATOR IMPLANT; for treatment of chronic pain  . URETERAL STENT PLACEMENT     Procedure: URETERAL STENT PLACEMENT

## 2023-10-14 ENCOUNTER — Observation Stay (HOSPITAL_COMMUNITY): Payer: MEDICAID

## 2023-10-14 ENCOUNTER — Emergency Department (HOSPITAL_BASED_OUTPATIENT_CLINIC_OR_DEPARTMENT_OTHER): Payer: MEDICAID

## 2023-10-14 ENCOUNTER — Encounter (HOSPITAL_BASED_OUTPATIENT_CLINIC_OR_DEPARTMENT_OTHER): Payer: Self-pay

## 2023-10-14 ENCOUNTER — Observation Stay (HOSPITAL_BASED_OUTPATIENT_CLINIC_OR_DEPARTMENT_OTHER)
Admission: EM | Admit: 2023-10-14 | Discharge: 2023-10-16 | Disposition: A | Discharge status: Home / Self Care | Payer: MEDICAID | Attending: Internal Medicine | Admitting: Internal Medicine

## 2023-10-14 DIAGNOSIS — M797 Fibromyalgia: Secondary | ICD-10-CM | POA: Diagnosis not present

## 2023-10-14 DIAGNOSIS — J410 Simple chronic bronchitis: Secondary | ICD-10-CM | POA: Diagnosis not present

## 2023-10-14 DIAGNOSIS — Z8669 Personal history of other diseases of the nervous system and sense organs: Secondary | ICD-10-CM | POA: Diagnosis not present

## 2023-10-14 DIAGNOSIS — R112 Nausea with vomiting, unspecified: Principal | ICD-10-CM | POA: Diagnosis present

## 2023-10-14 DIAGNOSIS — G629 Polyneuropathy, unspecified: Secondary | ICD-10-CM | POA: Insufficient documentation

## 2023-10-14 DIAGNOSIS — J449 Chronic obstructive pulmonary disease, unspecified: Secondary | ICD-10-CM | POA: Diagnosis present

## 2023-10-14 DIAGNOSIS — F12920 Cannabis use, unspecified with intoxication, uncomplicated: Secondary | ICD-10-CM | POA: Diagnosis not present

## 2023-10-14 DIAGNOSIS — F5000 Anorexia nervosa, unspecified: Secondary | ICD-10-CM | POA: Diagnosis not present

## 2023-10-14 DIAGNOSIS — R001 Bradycardia, unspecified: Secondary | ICD-10-CM | POA: Diagnosis not present

## 2023-10-14 DIAGNOSIS — K8689 Other specified diseases of pancreas: Secondary | ICD-10-CM | POA: Diagnosis present

## 2023-10-14 DIAGNOSIS — F319 Bipolar disorder, unspecified: Secondary | ICD-10-CM | POA: Diagnosis not present

## 2023-10-14 DIAGNOSIS — J45909 Unspecified asthma, uncomplicated: Secondary | ICD-10-CM | POA: Insufficient documentation

## 2023-10-14 DIAGNOSIS — K8681 Exocrine pancreatic insufficiency: Secondary | ICD-10-CM | POA: Insufficient documentation

## 2023-10-14 DIAGNOSIS — I959 Hypotension, unspecified: Secondary | ICD-10-CM | POA: Diagnosis not present

## 2023-10-14 DIAGNOSIS — I10 Essential (primary) hypertension: Secondary | ICD-10-CM | POA: Insufficient documentation

## 2023-10-14 DIAGNOSIS — E441 Mild protein-calorie malnutrition: Secondary | ICD-10-CM | POA: Insufficient documentation

## 2023-10-14 DIAGNOSIS — R109 Unspecified abdominal pain: Secondary | ICD-10-CM | POA: Diagnosis present

## 2023-10-14 DIAGNOSIS — F411 Generalized anxiety disorder: Secondary | ICD-10-CM | POA: Insufficient documentation

## 2023-10-14 DIAGNOSIS — F17210 Nicotine dependence, cigarettes, uncomplicated: Secondary | ICD-10-CM | POA: Insufficient documentation

## 2023-10-14 DIAGNOSIS — R627 Adult failure to thrive: Secondary | ICD-10-CM | POA: Insufficient documentation

## 2023-10-14 DIAGNOSIS — F32A Depression, unspecified: Secondary | ICD-10-CM | POA: Diagnosis present

## 2023-10-14 DIAGNOSIS — Z8719 Personal history of other diseases of the digestive system: Secondary | ICD-10-CM | POA: Diagnosis not present

## 2023-10-14 DIAGNOSIS — E861 Hypovolemia: Secondary | ICD-10-CM

## 2023-10-14 DIAGNOSIS — Z8659 Personal history of other mental and behavioral disorders: Secondary | ICD-10-CM | POA: Diagnosis not present

## 2023-10-14 DIAGNOSIS — K219 Gastro-esophageal reflux disease without esophagitis: Secondary | ICD-10-CM | POA: Diagnosis not present

## 2023-10-14 DIAGNOSIS — Z87898 Personal history of other specified conditions: Secondary | ICD-10-CM

## 2023-10-14 DIAGNOSIS — R1084 Generalized abdominal pain: Principal | ICD-10-CM

## 2023-10-14 LAB — PHOSPHORUS: Phosphorus: 3.9 mg/dL (ref 2.5–4.6)

## 2023-10-14 LAB — COMPREHENSIVE METABOLIC PANEL WITH GFR
ALT: 22 U/L (ref 0–44)
AST: 30 U/L (ref 15–41)
Albumin: 4.9 g/dL (ref 3.5–5.0)
Alkaline Phosphatase: 112 U/L (ref 38–126)
Anion gap: 16 — ABNORMAL HIGH (ref 5–15)
BUN: 14 mg/dL (ref 6–20)
CO2: 23 mmol/L (ref 22–32)
Calcium: 10.2 mg/dL (ref 8.9–10.3)
Chloride: 99 mmol/L (ref 98–111)
Creatinine, Ser: 0.83 mg/dL (ref 0.44–1.00)
GFR, Estimated: >60 mL/min (ref >60)
Glucose, Bld: 123 mg/dL — ABNORMAL HIGH (ref 70–99)
Potassium: 4.2 mmol/L (ref 3.5–5.1)
Sodium: 138 mmol/L (ref 135–145)
Total Bilirubin: 0.3 mg/dL (ref 0.0–1.2)
Total Protein: 8.2 g/dL — ABNORMAL HIGH (ref 6.5–8.1)

## 2023-10-14 LAB — URINALYSIS, ROUTINE W REFLEX MICROSCOPIC
Bilirubin Urine: NEGATIVE
Glucose, UA: NEGATIVE mg/dL
Hgb urine dipstick: NEGATIVE
Ketones, ur: NEGATIVE mg/dL
Leukocytes,Ua: NEGATIVE
Nitrite: NEGATIVE
Protein, ur: NEGATIVE mg/dL
Specific Gravity, Urine: 1.015 (ref 1.005–1.030)
pH: 6 (ref 5.0–8.0)

## 2023-10-14 LAB — CBC WITH DIFFERENTIAL/PLATELET
Abs Immature Granulocytes: 0.04 K/uL (ref 0.00–0.07)
Basophils Absolute: 0.1 K/uL (ref 0.0–0.1)
Basophils Relative: 1 %
Eosinophils Absolute: 0 K/uL (ref 0.0–0.5)
Eosinophils Relative: 0 %
HCT: 43.7 % (ref 36.0–46.0)
Hemoglobin: 14.6 g/dL (ref 12.0–15.0)
Immature Granulocytes: 0 %
Lymphocytes Relative: 26 %
Lymphs Abs: 2.6 K/uL (ref 0.7–4.0)
MCH: 29.4 pg (ref 26.0–34.0)
MCHC: 33.4 g/dL (ref 30.0–36.0)
MCV: 88.1 fL (ref 80.0–100.0)
Monocytes Absolute: 0.3 K/uL (ref 0.1–1.0)
Monocytes Relative: 3 %
Neutro Abs: 7.1 K/uL (ref 1.7–7.7)
Neutrophils Relative %: 70 %
Platelets: 435 K/uL — ABNORMAL HIGH (ref 150–400)
RBC: 4.96 MIL/uL (ref 3.87–5.11)
RDW: 12.6 % (ref 11.5–15.5)
WBC: 10 K/uL (ref 4.0–10.5)
nRBC: 0 % (ref 0.0–0.2)

## 2023-10-14 LAB — LACTIC ACID, PLASMA: Lactic Acid, Venous: 1.9 mmol/L (ref 0.5–1.9)

## 2023-10-14 LAB — LIPASE, BLOOD: Lipase: 45 U/L (ref 11–51)

## 2023-10-14 LAB — MAGNESIUM: Magnesium: 1.8 mg/dL (ref 1.7–2.4)

## 2023-10-14 MED ORDER — SODIUM CHLORIDE 0.9% FLUSH
3.0000 mL | Freq: Two times a day (BID) | INTRAVENOUS | Status: DC
  Administered 2023-10-14 – 2023-10-16 (×4): 3 mL via INTRAVENOUS

## 2023-10-14 MED ORDER — BUDESON-GLYCOPYRROL-FORMOTEROL 160-9-4.8 MCG/ACT IN AERO
2.0000 | INHALATION_SPRAY | Freq: Two times a day (BID) | RESPIRATORY_TRACT | Status: DC
  Administered 2023-10-15: 2 via RESPIRATORY_TRACT
  Filled 2023-10-14: qty 5.9

## 2023-10-14 MED ORDER — HYDROXYZINE HCL 25 MG PO TABS: 50.0000 mg | ORAL_TABLET | Freq: Every day | ORAL | Status: DC

## 2023-10-14 MED ORDER — HYDROMORPHONE HCL 1 MG/ML IJ SOLN
1.0000 mg | INTRAMUSCULAR | Status: DC | PRN
  Administered 2023-10-14 – 2023-10-15 (×5): 1 mg via INTRAVENOUS
  Filled 2023-10-14 (×5): qty 1

## 2023-10-14 MED ORDER — LACTATED RINGERS IV BOLUS
1000.0000 mL | Freq: Once | INTRAVENOUS | Status: AC
  Administered 2023-10-14: 1000 mL via INTRAVENOUS

## 2023-10-14 MED ORDER — GABAPENTIN 400 MG PO CAPS
800.0000 mg | ORAL_CAPSULE | Freq: Two times a day (BID) | ORAL | Status: DC
  Administered 2023-10-14 – 2023-10-16 (×4): 800 mg via ORAL
  Filled 2023-10-14: qty 2
  Filled 2023-10-14: qty 8
  Filled 2023-10-14 (×2): qty 2

## 2023-10-14 MED ORDER — ONDANSETRON HCL 4 MG/2ML IJ SOLN
4.0000 mg | Freq: Once | INTRAMUSCULAR | Status: AC
  Administered 2023-10-14: 4 mg via INTRAVENOUS
  Filled 2023-10-14: qty 2

## 2023-10-14 MED ORDER — ENOXAPARIN SODIUM 30 MG/0.3ML IJ SOSY
30.0000 mg | PREFILLED_SYRINGE | INTRAMUSCULAR | Status: DC
  Administered 2023-10-14 – 2023-10-15 (×2): 30 mg via SUBCUTANEOUS
  Filled 2023-10-14 (×2): qty 0.3

## 2023-10-14 MED ORDER — LACTATED RINGERS IV SOLN: INTRAVENOUS | Status: DC

## 2023-10-14 MED ORDER — LACTATED RINGERS IV SOLN: INTRAVENOUS | Status: AC

## 2023-10-14 MED ORDER — SODIUM CHLORIDE 0.9% FLUSH: 3.0000 mL | INTRAVENOUS | Status: DC | PRN

## 2023-10-14 MED ORDER — PROMETHAZINE HCL 25 MG PO TABS
25.0000 mg | ORAL_TABLET | Freq: Four times a day (QID) | ORAL | Status: DC | PRN
  Administered 2023-10-14 – 2023-10-15 (×3): 25 mg via ORAL
  Filled 2023-10-14 (×3): qty 1

## 2023-10-14 MED ORDER — IOHEXOL 300 MG/ML  SOLN
50.0000 mL | Freq: Once | INTRAMUSCULAR | Status: AC | PRN
  Administered 2023-10-14: 50 mL via INTRAVENOUS

## 2023-10-14 MED ORDER — HYDROMORPHONE HCL 1 MG/ML IJ SOLN
1.0000 mg | Freq: Once | INTRAMUSCULAR | Status: AC
  Administered 2023-10-14: 1 mg via INTRAVENOUS
  Filled 2023-10-14: qty 1

## 2023-10-14 MED ORDER — CLONAZEPAM 0.5 MG PO TABS: 0.5000 mg | ORAL_TABLET | Freq: Three times a day (TID) | ORAL | Status: DC

## 2023-10-14 MED ORDER — CLONAZEPAM 0.5 MG PO TABS
0.5000 mg | ORAL_TABLET | Freq: Three times a day (TID) | ORAL | Status: DC | PRN
  Administered 2023-10-14 – 2023-10-15 (×2): 0.5 mg via ORAL
  Filled 2023-10-14 (×2): qty 1

## 2023-10-14 MED ORDER — SODIUM CHLORIDE 0.9 % IV SOLN: 250.0000 mL | INTRAVENOUS | Status: AC | PRN

## 2023-10-14 MED ORDER — GABAPENTIN 400 MG PO CAPS: 400.0000 mg | ORAL_CAPSULE | Freq: Two times a day (BID) | ORAL | Status: DC

## 2023-10-14 MED ORDER — ALBUTEROL SULFATE (2.5 MG/3ML) 0.083% IN NEBU: 2.5000 mg | INHALATION_SOLUTION | RESPIRATORY_TRACT | Status: DC | PRN

## 2023-10-14 MED ORDER — IPRATROPIUM-ALBUTEROL 0.5-2.5 (3) MG/3ML IN SOLN: 3.0000 mL | RESPIRATORY_TRACT | Status: DC | PRN

## 2023-10-14 MED ORDER — OLANZAPINE 5 MG PO TABS: 5.0000 mg | ORAL_TABLET | Freq: Every day | ORAL | Status: DC

## 2023-10-14 MED ORDER — BOOST / RESOURCE BREEZE PO LIQD CUSTOM
1.0000 | Freq: Three times a day (TID) | ORAL | Status: DC
  Administered 2023-10-15 – 2023-10-16 (×3): 1 via ORAL

## 2023-10-14 MED ORDER — TRIMETHOBENZAMIDE HCL 100 MG/ML IM SOLN
200.0000 mg | Freq: Three times a day (TID) | INTRAMUSCULAR | Status: DC | PRN
  Administered 2023-10-15: 200 mg via INTRAMUSCULAR
  Filled 2023-10-14 (×2): qty 2

## 2023-10-14 MED ORDER — PANCRELIPASE (LIP-PROT-AMYL) 36000-114000 UNITS PO CPEP
36000.0000 [IU] | ORAL_CAPSULE | Freq: Three times a day (TID) | ORAL | Status: DC
  Administered 2023-10-14 – 2023-10-16 (×8): 36000 [IU] via ORAL
  Filled 2023-10-14 (×8): qty 1

## 2023-10-14 MED ORDER — CLONAZEPAM 0.5 MG PO TABS: 0.5000 mg | ORAL_TABLET | Freq: Three times a day (TID) | ORAL | Status: DC | PRN

## 2023-10-14 MED ORDER — PANTOPRAZOLE SODIUM 40 MG PO TBEC
40.0000 mg | DELAYED_RELEASE_TABLET | Freq: Two times a day (BID) | ORAL | Status: DC
  Administered 2023-10-14 – 2023-10-16 (×4): 40 mg via ORAL
  Filled 2023-10-14 (×4): qty 1

## 2023-10-14 NOTE — ED Provider Notes (Signed)
 Call for admission Physical Exam  BP (!) 89/59   Pulse (!) 44   Temp 98 F (36.7 C) (Oral)   Resp 15   Ht 5' 4 (1.626 m)   Wt 33.6 kg   SpO2 100%   BMI 12.70 kg/m   Physical Exam  Procedures  Procedures  ED Course / MDM   Clinical Course as of 10/14/23 1643  Sun Oct 14, 2023  1550 Recent outpatient GI visit in June: PLAN:  Continue creon  36k units with meals -continue phenergan  12.5mg  q6h PRN -zofran  4mg  Q8H PRN  -continue omeprazole  40mg  BID  -referral to IR for feeding tube placement -consider referral for esophageal manometry at follow up if dysphagia persists   [TY]    Clinical Course User Index [TY] Neysa Caron PARAS, DO   Medical Decision Making Amount and/or Complexity of Data Reviewed Labs: ordered. Radiology: ordered. ECG/medicine tests: ordered.  Risk Prescription drug management. Decision regarding hospitalization.  Patient has clear mental status.  She has had rehydration.  Blood pressures are in the upper 90s over 50s.  No respiratory distress.  Patient has not actively vomiting at this time.  She reports inability to tolerate any oral intake at least for several days.  Consult:Dr. Amine for admission.       Armenta Canning, MD 10/14/23 [phone]

## 2023-10-14 NOTE — ED Notes (Signed)
 Called [facility] for transport.

## 2023-10-14 NOTE — H&P (Addendum)
 History and Physical    [name] FMW:[id number] DOB: 06/21/1975 DOA: 10/14/2023  PCP: Orpha Yancey LABOR, MD   Patient coming from: Home   Chief Complaint:  Chief Complaint  Patient presents with   Abdominal Pain   ED TRIAGE note:  Scheduled to get a feeding tube put in. States she is unable to control her pain at home, been having terrible abdominal pain, vomiting, diarrhea.      HPI:  [name] is a 48 y.o. female with medical history significant of bipolar disorder, cervical cancer, COPD, depression, fibromyalgia, GERD, hypertension, IBS, seizure, history of substance abuse, obstructive sleep apnea, hyperlipidemia, IBS, chronic diarrhea, chronic dysphagia, chronic dysphagia previously has G-tube feeding November 2024 which has been removed due to infection (follows GI with [facility]) and unable to utilize tube feeding at home and to emergency department intractable nausea, vomiting, and diarrhea.  Patient reported that at baseline she has chronic diarrhea however it has been progressively getting worse. Stating that patient supposed to have a tube feed placement secondary to malnutrition by IR at [facility] which has not been scheduled until August.  Per chart review patient has been following outpatient GI with [facility] and last clinic visit on 09/27/2023-has been referred to IR for tube feeding placement. Also previously has speech therapist which showed dysphagia related to food aversion and anxiety related.  Per chart review previous EGD colonoscopy results are following: Last EGD; 10/2022  - No endoscopic esophageal abnormality to explain                            patient's dysphagia.                           - Gastritis. Biopsied. Internal bumper for                            gastro-jujenal tube in place                           - Normal duodenal bulb and second portion of the                            duodenum.                           -  Biopsies were taken with a cold forceps for                            evaluation of eosinophilic esophagitis.   Biopsies normal Colonoscopy 05/2021:- Five 3 to 10 mm polyps in the descending colon,                            in the transverse colon, in the ascending colon and                            at the ileocecal valve, removed with a cold snare.                            Resected and retrieved.                           -  The distal rectum and anal verge are normal on                            retroflexion view. (5 TAs, largest 1 cm) GES: 01/2022 minimally delayed emptying at 4 hours  Flex sig:06/11/20 - attempted colonoscopy, converted to flex sig due to presence of solid stool, Stool in the rectum, in the recto-sigmoid colon and in the sigmoid colon. - No specimens collected.  Recent outpatient GI visit in June:  Continue creon  36k units with meals -continue phenergan  12.5mg  q6h PRN -zofran  4mg  Q8H PRN  -continue omeprazole  40mg  BID  -referral to IR for feeding tube placement -consider referral for esophageal manometry at follow up if dysphagia persists   ED Course:  Presentation to ED patient was bradycardic and hypotensive.  Heart rate in between 48-56 and blood pressure in lower 90s and 60s range. CBC unremarkable.  CMP unremarkable except elevated anion gap 16.  Normal lipase level.  Normal lactic acid level.  UA unremarkable.  Show sinus bradycardia heart rate 45 and normal QTc interval.  CT abdomen pelvis no acute abdominal abnormality.  Status post  distended common bile duct measuring 1.6 cm, increased from the prior exam. Ultrasound is suggested for further evaluation. 2. Hepatic steatosis. 3. Diverticulosis without diverticulitis.cholecystectomy.  In the ED patient received Phenergan , Zofran  Protonix .  ED physician has been requested admission for management of IV fluid, pain control and possible G-tube placement to help with nutrition. Patient has been  transferred from [facility] to [facility] for management of intractable nausea and vomiting.  During my evaluation at the bedside patient reported that she has significant midline abdominal pain starting from lower chest up to lower abdomen.  Pain constant in nature.  Complaining about also back pain nausea. Reported she has long list of history of medication allergy.  Reported history of throat swelling with morphine and fentanyl  however she is able to tolerate Dilaudid .  In the ED patient received Dilaudid  without any allergic reaction and tolerated well.  Patient reported poor appetite and losing weight.  Having generalized weakness and muscle pain due to extensive muscle and fat loss.  Also has poor appetite at baseline.  Denies any chest pain, shortness of breath and palpitation.  Patient stated 1 episodes of seizure 3 months ago however never has been started any antiseizure medication and stated that someone informed her she has to take some as needed medication for seizure however does not know which medication it is.   Significant labs in the ED: Lab Orders         CBC with Differential         Comprehensive metabolic panel         Lipase, blood         Urinalysis, Routine w reflex microscopic -Urine, Clean Catch         HIV Antibody (routine testing w rflx)         CBC         Comprehensive metabolic panel         Magnesium          Phosphorus       Review of Systems:  Review of Systems  Constitutional:  Negative for chills, fever, malaise/fatigue and weight loss.  Respiratory:  Negative for cough and shortness of breath.   Cardiovascular:  Negative for chest pain, palpitations, orthopnea and leg swelling.  Gastrointestinal:  Positive for  abdominal pain, diarrhea, nausea and vomiting. Negative for blood in stool, constipation, heartburn and melena.  Genitourinary:  Negative for dysuria and urgency.  Musculoskeletal:  Negative for myalgias.   Neurological:  Negative for dizziness and headaches.  Psychiatric/Behavioral:  Negative for depression, hallucinations, memory loss, substance abuse and suicidal ideas. The patient is nervous/anxious. The patient does not have insomnia.     Past Medical History:  Diagnosis Date   Anal fissure 03/11/2009   Anemia    Anxiety and depression    ARTHRITIS 08/31/2007   Asthma    BENZODIAZEPINE ADDICTION 08/31/2007   Bipolar 1 disorder (HCC)    Bowel obstruction (HCC)    Breakdown of urinary electronic stimulator device, init (HCC)    BRONCHITIS, RECURRENT 08/23/2009   Asthmatic Bronchitis-Dr. Darlean.....-HFA 75% 12/04/2008>75% 02/05/2009>75% 08/04/2009 -PFT's 01/04/2009 2.56 (86%) ratio 75, no resp to B2 and DLC0 67% > 80 after correction    Cancer (HCC)    cervical cancer   Chronic interstitial cystitis 03/11/2009   Chronic nausea    Chronic pain    COLONIC POLYPS, HX OF 07/25/2006   ADENOMATOUS POLYP   COPD (chronic obstructive pulmonary disease) (HCC)    Endometriosis    FIBROMYALGIA 08/31/2007   GERD 02/05/2009   History of kidney stones    Hyperlipidemia    HYPERTENSION 08/31/2007   IBS 03/11/2009   Internal hemorrhoids    Migraine headache    Pancreatitis    PONV (postoperative nausea and vomiting)    RECTAL BLEEDING 03/11/2009   Sciatica    right leg   Seizures (HCC)    been about 1 year since last seisure per pt   Substance abuse (HCC)    Thyroid  disease    Uterine cyst     Past Surgical History:  Procedure Laterality Date   ABDOMINAL HYSTERECTOMY     BIOPSY  10/25/2018   Procedure: BIOPSY;  Surgeon: Golda Claudis PENNER, MD;  Location: [facility];  Service: Endoscopy;;  gastric   BIOPSY  06/11/2020   Procedure: BIOPSY;  Surgeon: Eartha Angelia Sieving, MD;  Location: [facility];  Service: Gastroenterology;;  small bowel   BIOPSY  10/26/2022   Procedure: BIOPSY;  Surgeon: Cinderella Deatrice FALCON, MD;  Location: [facility];  Service: Endoscopy;;   bladder  stretching x6     BLADDER SURGERY     stimulator placed and stretching    CHOLECYSTECTOMY     COLONOSCOPY     COLONOSCOPY WITH PROPOFOL  N/A 09/03/2014   Procedure: COLONOSCOPY WITH PROPOFOL ;  Surgeon: Sieving SHAUNNA Cedar, MD;  Location: [facility];  Service: Endoscopy;  Laterality: N/A;   COLONOSCOPY WITH PROPOFOL  N/A 05/20/2021   Procedure: COLONOSCOPY WITH PROPOFOL ;  Surgeon: Eartha Angelia Sieving, MD;  [facility]: [facility];  Service: Gastroenterology;  Laterality: N/A;  10:10 / Asa 2 pt states she has had a hysterectomy   ESOPHAGOGASTRODUODENOSCOPY (EGD) WITH PROPOFOL  N/A 10/25/2018   Procedure: ESOPHAGOGASTRODUODENOSCOPY (EGD) WITH PROPOFOL ;  Surgeon: Golda Claudis PENNER, MD;  [facility]: [facility];  Service: Endoscopy;  Laterality: N/A;  11:15   ESOPHAGOGASTRODUODENOSCOPY (EGD) WITH PROPOFOL  N/A 06/11/2020   Procedure: ESOPHAGOGASTRODUODENOSCOPY (EGD) WITH PROPOFOL ;  Surgeon: Eartha Angelia Sieving, MD;  [facility];  Service: Gastroenterology;  Laterality: N/A;   ESOPHAGOGASTRODUODENOSCOPY (EGD) WITH PROPOFOL  N/A 10/26/2022   Procedure: ESOPHAGOGASTRODUODENOSCOPY (EGD) WITH PROPOFOL ;  Surgeon: Cinderella Deatrice FALCON, MD;  [facility];  Service: Endoscopy;  Laterality: N/A;  9:00am;asa 3   interstitial cystitis  IR GASTROSTOMY TUBE MOD SED  01/27/2021   PACEMAKER INSERTION     in hip for interstitial cystitis   pacemaker removal     PEG TUBE PLACEMENT     POLYPECTOMY  05/20/2021   Procedure: POLYPECTOMY;  Surgeon: Eartha Flavors, Toribio, MD;  Location: [facility];  Service: Gastroenterology;;   removal of uterine cyst and scrapped uterus     replaced bladder pacemaker       reports that she has been smoking cigarettes. She has a 14 pack-year smoking history. She has never used smokeless tobacco. She reports that she does not currently use alcohol . She reports current drug use. Drug: Marijuana.  Allergies  Allergen Reactions   Abilify   [Aripiprazole ] Swelling, Palpitations and Other (See Comments)    Throat swelling, tremors    Amitriptyline Anaphylaxis, Swelling and Other (See Comments)    Angioedema   Metoclopramide Hcl Other (See Comments)    Causes seizures   Propoxyphene Hives, Itching, Dermatitis and Rash   Toradol  [Ketorolac  Tromethamine ]    Tramadol  Swelling, Other (See Comments) and Rash    Throat swelling, tremors   Eszopiclone Other (See Comments)    Hallucinations, hyperactivity, and bad taste in mouth    Fentanyl  Itching    Itching, tremors, tachycardia.   Nicoderm [Nicotine ] Other (See Comments)    Can tolerate only certain ones- itching and dizziness   Seroquel  [Quetiapine ] Other (See Comments)    Felt too lethargic the day after taking @ 400 mg   Ubrogepant Other (See Comments)    [place]- for migraines- My legs would not work   Varenicline  Other (See Comments)    Suicidal thoughts   Buprenorphine Hcl Itching and Hives   Demerol [Meperidine] Rash   Emetrol Hives, Itching, Dermatitis and Rash   Morphine And Codeine  Hives and Itching    Patient states she is able to tolerate Dilaudid .    Family History  Problem Relation Age of Onset   Heart disease Father    Asthma Maternal Grandmother    Emphysema Maternal Grandfather    Cancer Maternal Grandfather        Lung Cancer   Cancer Other        Lung Cancer-Aunt   Colon cancer Neg Hx    Esophageal cancer Neg Hx    Rectal cancer Neg Hx    Stomach cancer Neg Hx    Thyroid  disease Neg Hx     Prior to Admission medications   Medication Sig Start Date End Date Taking? Authorizing Provider  acetaminophen  (TYLENOL ) 500 MG tablet Take 1,000 mg by mouth every 6 (six) hours as needed for mild pain, fever or headache.    [provider]  albuterol  (PROAIR  HFA) 108 (90 Base) MCG/ACT inhaler INHALE 2 PUFFS EVERY 6 HOURS AS NEEDED FOR SHORTNESS OF BREATH AND WHEEZING. 03/21/23   Yolande Lamar BROCKS, MD  amoxicillin -clavulanate (AUGMENTIN )  875-125 MG tablet Take 1 tablet by mouth every 12 (twelve) hours. 03/21/23   Yolande Lamar BROCKS, MD  atomoxetine  (STRATTERA ) 10 MG capsule Take 10 mg by mouth daily.    [provider]  azithromycin  (ZITHROMAX ) 250 MG tablet Take 1 tablet (250 mg total) by mouth daily. Take first 2 tablets together, then 1 every day until finished. 03/21/23   Yolande Lamar BROCKS, MD  budesonide -formoterol  (SYMBICORT ) 80-4.5 MCG/ACT inhaler Inhale 2 puffs into the lungs 2 (two) times daily. 10/20/22 09/27/23  Saguier, [place], PA-C  busPIRone  (BUSPAR ) 15 MG tablet Take 1 tablet (15 mg total) by  mouth 3 (three) times daily. 09/13/22   Maczis, Michael M, PA-C  clonazePAM  (KLONOPIN ) 0.5 MG tablet Take 1 tablet (0.5 mg total) by mouth 3 (three) times daily as needed for anxiety. Patient taking differently: Take 0.5 mg by mouth 3 (three) times daily. 05/01/19   Antonio Cyndee Jamee JONELLE, DO  cyanocobalamin  (VITAMIN B12) 1000 MCG tablet Take 1,000 mcg by mouth daily.    [provider]  estradiol  (ESTRACE ) 0.1 MG/GM vaginal cream Place 1 Applicatorful vaginally at bedtime. 11/21/19   [provider]  fluticasone  (FLONASE ) 50 MCG/ACT nasal spray Place 2 sprays into both nostrils daily. Patient taking differently: Place 2 sprays into both nostrils daily as needed for allergies. 04/22/19   Antonio Cyndee Jamee JONELLE, DO  gabapentin  (NEURONTIN ) 800 MG tablet Take 0.5 tablets (400 mg total) by mouth in the morning, at noon, in the evening, and at bedtime. 02/01/21   Ghimire, Kuber, MD  hydrOXYzine  (ATARAX /VISTARIL ) 50 MG tablet Take 50 mg by mouth at bedtime. 08/26/18   [provider]  lidocaine  (LIDODERM ) 5 % Place 1 patch onto the skin daily. Remove & Discard patch within 12 hours or as directed by MD 05/27/23   Donnajean Lynwood DEL, PA-C  lipase/protease/amylase (CREON ) 36000 UNITS CPEP capsule Take 1 capsule (36,000 Units total) by mouth 4 (four) times daily -  with meals and at bedtime. 04/26/21   Carlan,  Chelsea L, NP  Magnesium  250 MG TABS Take 250 mg by mouth daily.    [provider]  metoprolol  tartrate (LOPRESSOR ) 25 MG tablet Take 0.5 tablets (12.5 mg total) by mouth 2 (two) times daily. Patient taking differently: Take 25 mg by mouth 2 (two) times daily. 09/13/22   Maczis, Michael M, PA-C  OLANZapine  (ZYPREXA ) 5 MG tablet Take 1 tablet (5 mg total) by mouth daily. 09/14/22   Maczis, Michael M, PA-C  omeprazole  (PRILOSEC) 40 MG capsule Take 1 capsule (40 mg total) by mouth 2 (two) times daily. 02/12/23   Carlan, Mitzie CROME, NP  ondansetron  (ZOFRAN ) 4 MG tablet Take 1 tablet (4 mg total) by mouth every 8 (eight) hours as needed for nausea or vomiting. 09/27/23   Carlan, Chelsea L, NP  oxybutynin  (DITROPAN  XL) 15 MG 24 hr tablet Take 15 mg by mouth daily. 09/16/19   [provider]  Oxycodone  HCl 10 MG TABS Take 1-1.5 tablets (10-15 mg total) by mouth every 6 (six) hours as needed for breakthrough pain. 09/13/22   Maczis, Michael M, PA-C  promethazine  (PHENERGAN ) 12.5 MG tablet Take 2 tablets (25 mg total) by mouth every 6 (six) hours as needed for nausea or vomiting. 02/14/23   Carlan, Chelsea L, NP  promethazine  (PHENERGAN ) 25 MG tablet Take 0.5 tablets (12.5 mg total) by mouth every 6 (six) hours as needed for nausea or vomiting. 07/26/23   Zelaya, Oscar A, PA-C  QUEtiapine  (SEROQUEL ) 400 MG tablet Take 800 mg by mouth at bedtime. 09/16/18   [provider]  RESTASIS  0.05 % ophthalmic emulsion Place 1 drop into both eyes 2 (two) times daily. 02/26/20   [provider]  traZODone  (DESYREL ) 150 MG tablet Take 150 mg by mouth at bedtime.    [provider]     Physical Exam: Vitals:   10/14/23 1815 10/14/23 1916 10/14/23 2336 10/15/23 0328  BP: 95/68 (!) 95/52 (!) 90/59 100/72  Pulse: (!) 48 (!) 52 (!) 55 65  Resp: 17 20 17 18   Temp:  (!) 97.5 F (36.4 C) 97.6 F (36.4  C) 98 F (36.7 C)  TempSrc:  Oral Oral   SpO2: 98% 97% 97% 96%  Weight:      Height:         Physical Exam Constitutional:      Appearance: She is ill-appearing.  Cardiovascular:     Rate and Rhythm: Regular rhythm. Bradycardia present.     Heart sounds: Normal heart sounds.  Abdominal:     General: Bowel sounds are normal.     Palpations: Abdomen is soft.     Tenderness: There is no abdominal tenderness. There is no guarding or rebound.  Skin:    General: Skin is dry.     Capillary Refill: Capillary refill takes less than 2 seconds.  Neurological:     Mental Status: She is alert and oriented to person, place, and time.      Labs on Admission: I have personally reviewed following labs and imaging studies  CBC: Recent Labs  Lab 10/14/23 1305 10/15/23 0320  WBC 10.0 9.1  NEUTROABS 7.1  --   HGB 14.6 10.7*  HCT 43.7 33.1*  MCV 88.1 92.7  PLT 435* 284   Basic Metabolic Panel: Recent Labs  Lab 10/14/23 1305 10/14/23 2030 10/14/23 2040 10/15/23 0320  NA 138  --   --  137  K 4.2  --   --  3.3*  CL 99  --   --  105  CO2 23  --   --  25  GLUCOSE 123*  --   --  81  BUN 14  --   --  11  CREATININE 0.83  --   --  0.70  CALCIUM 10.2  --   --  8.5*  MG  --   --  1.8  --   PHOS  --  3.9  --   --    GFR: Estimated Creatinine Clearance: 46.1 mL/min (by C-G formula based on SCr of 0.7 mg/dL). Liver Function Tests: Recent Labs  Lab 10/14/23 1305 10/15/23 0320  AST 30 24  ALT 22 16  ALKPHOS 112 61  BILITOT 0.3 0.5  PROT 8.2* 5.7*  ALBUMIN  4.9 2.9*   Recent Labs  Lab 10/14/23 1305  LIPASE 45   No results for input(s): AMMONIA in the last 168 hours. Coagulation Profile: No results for input(s): INR, PROTIME in the last 168 hours. Cardiac Enzymes: No results for input(s): CKTOTAL, CKMB, CKMBINDEX, TROPONINI, TROPONINIHS in the last 168 hours. BNP (last 3 results) No results for input(s): BNP in the last 8760 hours. HbA1C: No results for input(s): HGBA1C in the last 72 hours. CBG: No results for input(s): GLUCAP in the  last 168 hours. Lipid Profile: No results for input(s): CHOL, HDL, LDLCALC, TRIG, CHOLHDL, LDLDIRECT in the last 72 hours. Thyroid  Function Tests: No results for input(s): TSH, T4TOTAL, FREET4, T3FREE, THYROIDAB in the last 72 hours. Anemia Panel: No results for input(s): VITAMINB12, FOLATE, FERRITIN, TIBC, IRON, RETICCTPCT in the last 72 hours. Urine analysis:    Component Value Date/Time   COLORURINE YELLOW 10/14/2023 1555   APPEARANCEUR HAZY (A) 10/14/2023 1555   LABSPEC 1.015 10/14/2023 1555   PHURINE 6.0 10/14/2023 1555   GLUCOSEU NEGATIVE 10/14/2023 1555   HGBUR NEGATIVE 10/14/2023 1555   BILIRUBINUR NEGATIVE 10/14/2023 1555   BILIRUBINUR positive 10/02/2019 1038   KETONESUR NEGATIVE 10/14/2023 1555   PROTEINUR NEGATIVE 10/14/2023 1555   UROBILINOGEN 0.2 10/02/2019 1038   UROBILINOGEN 0.2 06/23/2014 1300   NITRITE NEGATIVE 10/14/2023 1555   LEUKOCYTESUR NEGATIVE 10/14/2023  1555    Radiological Exams on Admission: I have personally reviewed images US  Abdomen Limited RUQ (LIVER/GB) Result Date: 10/14/2023 CLINICAL DATA:  Common bile duct dilatation EXAM: ULTRASOUND ABDOMEN LIMITED RIGHT UPPER QUADRANT COMPARISON:  CT 10/14/2023 FINDINGS: Gallbladder: Surgically absent Common bile duct: Diameter: 9 mm maximum. Liver: Slight central intra hepatic biliary dilatation. No focal hepatic abnormality. Portal vein is patent on color Doppler imaging with normal direction of blood flow towards the liver. Other: None. IMPRESSION: Status post cholecystectomy. Mild dilatation of the common bile duct and central intrahepatic bile ducts, could be due to post cholecystectomy change but correlate with LFTs, follow-up MRCP if indicated Electronically Signed   By: Luke Bun M.D.   On: 10/14/2023 20:15   CT ABDOMEN PELVIS W CONTRAST Result Date: 10/14/2023 CLINICAL DATA:  Abdominal pain, vomiting, and diarrhea. Bowel obstruction suspected. EXAM: CT ABDOMEN AND PELVIS  WITH CONTRAST TECHNIQUE: Multidetector CT imaging of the abdomen and pelvis was performed using the standard protocol following bolus administration of intravenous contrast. RADIATION DOSE REDUCTION: This exam was performed according to the departmental dose-optimization program which includes automated exposure control, adjustment of the mA and/or kV according to patient size and/or use of iterative reconstruction technique. CONTRAST:  50mL OMNIPAQUE  IOHEXOL  300 MG/ML  SOLN COMPARISON:  09/01/2022. FINDINGS: Lower chest: No acute abnormality. Hepatobiliary: No focal abnormality in the liver. Fatty infiltration of the liver is noted. There is intrahepatic and extrahepatic biliary ductal dilatation in the common bile duct is distended measuring 1.6 cm. The gallbladder is surgically absent. Pancreas: Unremarkable. No pancreatic ductal dilatation or surrounding inflammatory changes. Spleen: Normal in size without focal abnormality. Adrenals/Urinary Tract: The adrenal glands are within normal limits. The kidneys enhance symmetrically. No renal calculus or hydronephrosis bilaterally. The bladder is unremarkable. Stomach/Bowel: The stomach is within normal limits. No bowel obstruction, free air, or pneumatosis is seen. A few scattered diverticula are seen along the colon without evidence of diverticulitis. Appendix appears normal. Vascular/Lymphatic: No significant vascular findings are present. No enlarged abdominal or pelvic lymph nodes. Reproductive: Status post hysterectomy. No adnexal masses. Other: No abdominopelvic ascites. Musculoskeletal: No acute osseous abnormality. Neurostimulator device is noted in the posterior left hip with lead terminating in the presacral space on the right. A chronic compression deformity is noted in the superior endplate at L5. IMPRESSION: 1. Status post cholecystectomy with distended common bile duct measuring 1.6 cm, increased from the prior exam. Ultrasound is suggested for further  evaluation. 2. Hepatic steatosis. 3. Diverticulosis without diverticulitis. Electronically Signed   By: Leita Birmingham M.D.   On: 10/14/2023 14:52     EKG: My personal interpretation of EKG shows: Sinus bradycardia heart rate 47    Assessment/Plan: Principal Problem:   Intractable nausea and vomiting Active Problems:   Bipolar disorder (HCC)   COPD (chronic obstructive pulmonary disease) (HCC)   Depression   GERD (gastroesophageal reflux disease)   Fibromyalgia   History of IBS   Pancreatic insufficiency   History of dysphagia   Chronic sinus bradycardia   Hypotension   Anorexia nervosa   Continuous dependence on cigarette smoking    Assessment and Plan: Intractable nausea, vomiting  History of chronic nausea, vomiting and diarrhea History of chronic dysphagia-previously on PEG tube Chronic pancreatic insufficiency History of IBS Chronic abdominal pain -History of chronic dysphagia previously on G-tube feeding, IBS, pancreatic insufficiency, chronic nausea, vomiting and diarrhea has been following GI with [facility] presented to emergency department complaining of acute on chronic abdominal pain nausea vomiting  and diarrhea.  Unable to tolerate any food at home have continues to having diarrhea.  Patient denies any fever and chills. -Patient has been scheduled for tube feeding placement by IR in August 2025 - At presentation to ED patient is bradycardic and hypotensive.  CBC, CMP, lipase within normal range.  -CT abdomen pelvis showed status post cholecystectomy common bile duct dilation 1.6 cm increased as compared to prior exam recommended us .  Hepatic steatosis.  Diverticulosis without diverticulitis. -At this time we will continue symptomatic control of nausea, vomiting and diarrhea. -Continue Protonix  40 mg twice daily, Phenergan  as needed and Tigan  for refractory nausea vomiting. - Per chart review previous EGD, colonoscopy normal findings. - Previously being evaluated  by psychiatry and it is seems like that dysphagia mostly related to underlying anxiety related and history of chronic food aversion.  Patient has long list of medication and food allergies as well. - Plan to continue conservative management include continue Protonix  40 mg twice daily, Phenergan  as needed and Tigan  for refractory nausea vomiting.  Continue Creon  3 times daily with meal. -In ED patient received 2 L of LR bolus due to hypotension continue maintenance fluid at 150 cc/h - Consulted [facility] Dr. Rosalie for evaluation tomorrow.  If GI recommended that patient needs PEG tube placement during this hospitalization in that case need to consult IR eventually.   History of bipolar disorder Generalized anxiety disorder History of depression -Per chart review patient is on 4 different antipsychotic, antianxiety and depressive medications.   -At the bedside patient reports that she takes Klonopin  3 times daily however unable to verify other antipsychotic medications. Plan to continue Klonopin  0.5 mg 3 times daily for anxiety management.  Waiting for pharmacy verification of other antipsychotic medications.  Chronic sinus bradycardia -Holding metoprolol  in the setting of bradycardia.  Continue cardiac monitoring  Hypotension -Low blood pressure.  Hypotension in the setting of persistent nausea vomiting diarrhea. - Received 2 L of LR bolus in the ED.  Starting maintenance fluid LR 150 cc/h.   History of COPD - Continue Breztri  and DuoNeb as needed  History of seizure -At home patient is not any medication for seizure.  Peripheral neuropathy - Continue gabapentin  800 mg twice daily  Protein calorie malnutrition Anorexia nervosa - Low BMI 12.7 and patient is bradycardic.  History of chronic food aversion secondary to anxiety, depression and bipolar related.  Cigarette smoking - Patient continues to smoke half pack cigarettes in a day.  Requesting for nicotine  patch.  Per chart review  patient has itchiness due to nicotine  patch.  Given patient is requesting for it will not as of now however if patient develops itchiness need to remove the patch. -Counsel provided for smoking cessation.  Pending pharmacy reconciliation and verification of home medications.  DVT prophylaxis:  Lovenox  Code Status:  Full Code Diet: Clear liquid diet Family Communication:   Family was present at bedside, at the time of interview. Opportunity was given to ask question and all questions were answered satisfactorily.  Disposition Plan: Continue to monitor improvement of nausea/vomiting/diarrhea.  Consulted GI for further recommendation. Consults: Gastroenterology Admission status:   Observation, Telemetry bed  Severity of Illness: The appropriate patient status for this patient is OBSERVATION. Observation status is judged to be reasonable and necessary in order to provide the required intensity of service to ensure the patient's safety. The patient's presenting symptoms, physical exam findings, and initial radiographic and laboratory data in the context of their medical condition is felt to  place them at decreased risk for further clinical deterioration. Furthermore, it is anticipated that the patient will be medically stable for discharge from the hospital within 2 midnights of admission.     Mykira Hofmeister, MD [facility]  How to contact the [facility] Attending or Consulting provider 7A - 7P or covering provider during after hours 7P -7A, for this patient.  Check the care team in [facility] and look for a) attending/consulting [facility] provider listed and b) the TR[facility] team listed Log into [email or web address] and use [place]'s universal password to access. If you do not have the password, please contact the hospital operator. Locate the [facility] provider you are looking for under [facility] and page to a number that you can be directly reached. If you still have difficulty reaching the provider, please  page the [facility] (Director on Call) for the Hospitalists listed on amion for assistance.  10/15/2023, 4:35 AM

## 2023-10-14 NOTE — ED Triage Notes (Signed)
 Scheduled to get a feeding tube put in. States she is unable to control her pain at home, been having terrible abdominal pain, vomiting, diarrhea.

## 2023-10-14 NOTE — ED Notes (Addendum)
Patient transported to CT and back without event.

## 2023-10-14 NOTE — ED Provider Notes (Addendum)
 [facility] Provider Note   CSN: [id number] Arrival date & time: 10/14/23  1236     Patient presents with: Abdominal Pain   [name] is a 48 y.o. female.   49 year old female presenting emergency department with generalized abdominal pain.  Having nausea and vomiting.  Had loose bowel movement today.  Reports symptoms have progressively worsened.  Supposedly is post to get feeding tube placed secondary to malnutrition by IR at [facility], but not until August.   Abdominal Pain      Prior to Admission medications   Medication Sig Start Date End Date Taking? Authorizing Provider  acetaminophen  (TYLENOL ) 500 MG tablet Take 1,000 mg by mouth every 6 (six) hours as needed for mild pain, fever or headache.    [provider]  albuterol  (PROAIR  HFA) 108 (90 Base) MCG/ACT inhaler INHALE 2 PUFFS EVERY 6 HOURS AS NEEDED FOR SHORTNESS OF BREATH AND WHEEZING. 03/21/23   Yolande Lamar BROCKS, MD  amoxicillin -clavulanate (AUGMENTIN ) 875-125 MG tablet Take 1 tablet by mouth every 12 (twelve) hours. 03/21/23   Yolande Lamar BROCKS, MD  atomoxetine  (STRATTERA ) 10 MG capsule Take 10 mg by mouth daily.    [provider]  azithromycin  (ZITHROMAX ) 250 MG tablet Take 1 tablet (250 mg total) by mouth daily. Take first 2 tablets together, then 1 every day until finished. 03/21/23   Yolande Lamar BROCKS, MD  budesonide -formoterol  (SYMBICORT ) 80-4.5 MCG/ACT inhaler Inhale 2 puffs into the lungs 2 (two) times daily. 10/20/22 09/27/23  Saguier, [place], PA-C  busPIRone  (BUSPAR ) 15 MG tablet Take 1 tablet (15 mg total) by mouth 3 (three) times daily. 09/13/22   Maczis, Michael M, PA-C  clonazePAM  (KLONOPIN ) 0.5 MG tablet Take 1 tablet (0.5 mg total) by mouth 3 (three) times daily as needed for anxiety. Patient taking differently: Take 0.5 mg by mouth 3 (three) times daily. 05/01/19   Antonio Cyndee Jamee JONELLE, DO  cyanocobalamin  (VITAMIN B12) 1000 MCG  tablet Take 1,000 mcg by mouth daily.    [provider]  estradiol  (ESTRACE ) 0.1 MG/GM vaginal cream Place 1 Applicatorful vaginally at bedtime. 11/21/19   [provider]  fluticasone  (FLONASE ) 50 MCG/ACT nasal spray Place 2 sprays into both nostrils daily. Patient taking differently: Place 2 sprays into both nostrils daily as needed for allergies. 04/22/19   Antonio Cyndee Jamee JONELLE, DO  gabapentin  (NEURONTIN ) 800 MG tablet Take 0.5 tablets (400 mg total) by mouth in the morning, at noon, in the evening, and at bedtime. 02/01/21   Ghimire, Kuber, MD  hydrOXYzine  (ATARAX /VISTARIL ) 50 MG tablet Take 50 mg by mouth at bedtime. 08/26/18   [provider]  lidocaine  (LIDODERM ) 5 % Place 1 patch onto the skin daily. Remove & Discard patch within 12 hours or as directed by MD 05/27/23   Donnajean Lynwood DEL, PA-C  lipase/protease/amylase (CREON ) 36000 UNITS CPEP capsule Take 1 capsule (36,000 Units total) by mouth 4 (four) times daily -  with meals and at bedtime. 04/26/21   Carlan, Chelsea L, NP  Magnesium  250 MG TABS Take 250 mg by mouth daily.    [provider]  metoprolol  tartrate (LOPRESSOR ) 25 MG tablet Take 0.5 tablets (12.5 mg total) by mouth 2 (two) times daily. Patient taking differently: Take 25 mg by mouth 2 (two) times daily. 09/13/22   Maczis, Michael M, PA-C  OLANZapine  (ZYPREXA ) 5 MG tablet Take 1 tablet (5 mg total) by mouth daily. 09/14/22   Maczis, Michael M,  PA-C  omeprazole  (PRILOSEC) 40 MG capsule Take 1 capsule (40 mg total) by mouth 2 (two) times daily. 02/12/23   Carlan, Mitzie CROME, NP  ondansetron  (ZOFRAN ) 4 MG tablet Take 1 tablet (4 mg total) by mouth every 8 (eight) hours as needed for nausea or vomiting. 09/27/23   Carlan, Chelsea L, NP  oxybutynin  (DITROPAN  XL) 15 MG 24 hr tablet Take 15 mg by mouth daily. 09/16/19   [provider]  Oxycodone  HCl 10 MG TABS Take 1-1.5 tablets (10-15 mg total) by mouth every 6 (six) hours as needed for breakthrough  pain. 09/13/22   Maczis, Michael M, PA-C  promethazine  (PHENERGAN ) 12.5 MG tablet Take 2 tablets (25 mg total) by mouth every 6 (six) hours as needed for nausea or vomiting. 02/14/23   Carlan, Chelsea L, NP  promethazine  (PHENERGAN ) 25 MG tablet Take 0.5 tablets (12.5 mg total) by mouth every 6 (six) hours as needed for nausea or vomiting. 07/26/23   Zelaya, Oscar A, PA-C  QUEtiapine  (SEROQUEL ) 400 MG tablet Take 800 mg by mouth at bedtime. 09/16/18   [provider]  RESTASIS  0.05 % ophthalmic emulsion Place 1 drop into both eyes 2 (two) times daily. 02/26/20   [provider]  traZODone  (DESYREL ) 150 MG tablet Take 150 mg by mouth at bedtime.    [provider]    Allergies: Abilify  [aripiprazole ], Amitriptyline, Metoclopramide hcl, Propoxyphene, Toradol  [ketorolac  tromethamine ], Tramadol , Ambien  [zolpidem  tartrate], Eszopiclone, Fentanyl , Nicoderm [nicotine ], Varenicline , Buprenorphine hcl, Demerol [meperidine], Emetrol, and Morphine and codeine     Review of Systems  Gastrointestinal:  Positive for abdominal pain.    Updated Vital Signs BP (!) 82/54   Pulse (!) 48   Temp 98 F (36.7 C) (Oral)   Resp 19   Ht 5' 4 (1.626 m)   Wt 33.6 kg   SpO2 99%   BMI 12.70 kg/m   Physical Exam Vitals and nursing note reviewed.  Constitutional:      General: She is not in acute distress. HENT:     Head: Normocephalic.  Cardiovascular:     Rate and Rhythm: Normal rate and regular rhythm.  Pulmonary:     Effort: Pulmonary effort is normal.     Breath sounds: Normal breath sounds.  Abdominal:     General: Abdomen is flat.     Tenderness: There is generalized abdominal tenderness.  Skin:    General: Skin is warm.     Capillary Refill: Capillary refill takes less than 2 seconds.  Neurological:     Mental Status: She is alert and oriented to person, place, and time.  Psychiatric:        Mood and Affect: Mood normal.        Behavior: Behavior normal.     (all labs  ordered are listed, but only abnormal results are displayed) Labs Reviewed  CBC WITH DIFFERENTIAL/PLATELET - Abnormal; Notable for the following components:      Result Value   Platelets 435 (*)    All other components within normal limits  COMPREHENSIVE METABOLIC PANEL WITH GFR - Abnormal; Notable for the following components:   Glucose, Bld 123 (*)    Total Protein 8.2 (*)    Anion gap 16 (*)    All other components within normal limits  LIPASE, BLOOD  LACTIC ACID, PLASMA  URINALYSIS, ROUTINE W REFLEX MICROSCOPIC    EKG: EKG Interpretation Date/Time:  Sunday October 14 2023 15:47:13 EDT Ventricular Rate:  45 PR Interval:  131 QRS Duration:  71 QT Interval:  528 QTC Calculation: 457 R Axis:   76  Text Interpretation: Sinus bradycardia Confirmed by Neysa Clap [id number]) on 10/14/2023 3:48:09 PM  Radiology: CT ABDOMEN PELVIS W CONTRAST Result Date: 10/14/2023 CLINICAL DATA:  Abdominal pain, vomiting, and diarrhea. Bowel obstruction suspected. EXAM: CT ABDOMEN AND PELVIS WITH CONTRAST TECHNIQUE: Multidetector CT imaging of the abdomen and pelvis was performed using the standard protocol following bolus administration of intravenous contrast. RADIATION DOSE REDUCTION: This exam was performed according to the departmental dose-optimization program which includes automated exposure control, adjustment of the mA and/or kV according to patient size and/or use of iterative reconstruction technique. CONTRAST:  50mL OMNIPAQUE  IOHEXOL  300 MG/ML  SOLN COMPARISON:  09/01/2022. FINDINGS: Lower chest: No acute abnormality. Hepatobiliary: No focal abnormality in the liver. Fatty infiltration of the liver is noted. There is intrahepatic and extrahepatic biliary ductal dilatation in the common bile duct is distended measuring 1.6 cm. The gallbladder is surgically absent. Pancreas: Unremarkable. No pancreatic ductal dilatation or surrounding inflammatory changes. Spleen: Normal in size without focal  abnormality. Adrenals/Urinary Tract: The adrenal glands are within normal limits. The kidneys enhance symmetrically. No renal calculus or hydronephrosis bilaterally. The bladder is unremarkable. Stomach/Bowel: The stomach is within normal limits. No bowel obstruction, free air, or pneumatosis is seen. A few scattered diverticula are seen along the colon without evidence of diverticulitis. Appendix appears normal. Vascular/Lymphatic: No significant vascular findings are present. No enlarged abdominal or pelvic lymph nodes. Reproductive: Status post hysterectomy. No adnexal masses. Other: No abdominopelvic ascites. Musculoskeletal: No acute osseous abnormality. Neurostimulator device is noted in the posterior left hip with lead terminating in the presacral space on the right. A chronic compression deformity is noted in the superior endplate at L5. IMPRESSION: 1. Status post cholecystectomy with distended common bile duct measuring 1.6 cm, increased from the prior exam. Ultrasound is suggested for further evaluation. 2. Hepatic steatosis. 3. Diverticulosis without diverticulitis. Electronically Signed   By: Leita Birmingham M.D.   On: 10/14/2023 14:52     Procedures   Medications Ordered in the ED  HYDROmorphone  (DILAUDID ) injection 1 mg (1 mg Intravenous Given 10/14/23 1319)  lactated ringers  bolus 1,000 mL (0 mLs Intravenous Stopped 10/14/23 1523)  ondansetron  (ZOFRAN ) injection 4 mg (4 mg Intravenous Given 10/14/23 1319)  iohexol  (OMNIPAQUE ) 300 MG/ML solution 50 mL (50 mLs Intravenous Contrast Given 10/14/23 1422)  lactated ringers  bolus 1,000 mL (1,000 mLs Intravenous New Bag/Given 10/14/23 1525)    Clinical Course as of 10/14/23 1601  Sun Oct 14, 2023  1550 Recent outpatient GI visit in June: PLAN:  Continue creon  36k units with meals -continue phenergan  12.5mg  q6h PRN -zofran  4mg  Q8H PRN  -continue omeprazole  40mg  BID  -referral to IR for feeding tube placement -consider referral for esophageal  manometry at follow up if dysphagia persists   [TY]    Clinical Course User Index [TY] Neysa Clap PARAS, DO                                 Medical Decision Making 48 year old female with past medical history significant for significant for interstitial cystitis, urinary retention, IBS, fibromyalgia, chronic pain, history of G-tube dependence secondary to chronic pancreatitis with removal of G-tube due to housing instability, COPD and bipolar disorder presenting emergency department with acute on chronic abdominal pain.  Vitals largely reassuring. Nonsurgical abdomen, but does have some mild diffusely tender.  Per chart review was seen  a couple days ago [facility] had normal labs and discharged at that time.  No CT scan was performed.  Given patient's numerous comorbidities and repeat presentation to the emergency department will get CT scan to evaluate for obstruction.  Will get screening labs as well.  Does appear to be in pain, will give IV Dilaudid , IV fluids and Zofran .  Labs as noted in ED course.  CT scan with no acute surgical pathology, but did show minor CBD dilation.  No LFT elevation.  Not particularly tender in right upper quadrant to palpation.  However she notes she is still having severe pain despite IV Dilaudid .  Still nauseated.  Per chart review she has lost close to 20 pounds since the end of April early May.  She has had soft blood pressures here which appears to be close to her baseline as prior blood pressure readings over the past several healthcare interactions have been in the 90s systolic.  However, blood pressure slightly worse.  She is extremely weak and reports having difficulty with ADLs at this point.  Given patient's overall clinical picture with severe abdominal pain nausea vomiting with failure to thrive  we will admit for IV fluids, pain control and possible G-tube placement to help with nutrition.  Amount and/or Complexity of Data Reviewed Labs: ordered.  Decision-making details documented in ED Course. Radiology: ordered and independent interpretation performed. ECG/medicine tests: ordered.  Risk Prescription drug management. Decision regarding hospitalization. Diagnosis or treatment significantly limited by social determinants of health. Risk Details: Documented with some housing instability      Final diagnoses:  Generalized abdominal pain  Failure to thrive  in adult    ED Discharge Orders     None          Neysa Caron PARAS, DO 10/14/23 1544    Neysa Caron PARAS, DO 10/14/23 1551    Neysa Caron PARAS, DO 10/14/23 1601

## 2023-10-15 ENCOUNTER — Encounter (HOSPITAL_COMMUNITY): Payer: Self-pay | Admitting: Internal Medicine

## 2023-10-15 ENCOUNTER — Other Ambulatory Visit: Payer: Self-pay

## 2023-10-15 DIAGNOSIS — R112 Nausea with vomiting, unspecified: Secondary | ICD-10-CM | POA: Diagnosis not present

## 2023-10-15 DIAGNOSIS — F17210 Nicotine dependence, cigarettes, uncomplicated: Secondary | ICD-10-CM | POA: Insufficient documentation

## 2023-10-15 LAB — COMPREHENSIVE METABOLIC PANEL WITH GFR
ALT: 16 U/L (ref 0–44)
AST: 24 U/L (ref 15–41)
Albumin: 2.9 g/dL — ABNORMAL LOW (ref 3.5–5.0)
Alkaline Phosphatase: 61 U/L (ref 38–126)
Anion gap: 7 (ref 5–15)
BUN: 11 mg/dL (ref 6–20)
CO2: 25 mmol/L (ref 22–32)
Calcium: 8.5 mg/dL — ABNORMAL LOW (ref 8.9–10.3)
Chloride: 105 mmol/L (ref 98–111)
Creatinine, Ser: 0.7 mg/dL (ref 0.44–1.00)
GFR, Estimated: >60 mL/min (ref >60)
Glucose, Bld: 81 mg/dL (ref 70–99)
Potassium: 3.3 mmol/L — ABNORMAL LOW (ref 3.5–5.1)
Sodium: 137 mmol/L (ref 135–145)
Total Bilirubin: 0.5 mg/dL (ref 0.0–1.2)
Total Protein: 5.7 g/dL — ABNORMAL LOW (ref 6.5–8.1)

## 2023-10-15 LAB — CBC
HCT: 33.1 % — ABNORMAL LOW (ref 36.0–46.0)
Hemoglobin: 10.7 g/dL — ABNORMAL LOW (ref 12.0–15.0)
MCH: 30 pg (ref 26.0–34.0)
MCHC: 32.3 g/dL (ref 30.0–36.0)
MCV: 92.7 fL (ref 80.0–100.0)
Platelets: 284 K/uL (ref 150–400)
RBC: 3.57 MIL/uL — ABNORMAL LOW (ref 3.87–5.11)
RDW: 12.7 % (ref 11.5–15.5)
WBC: 9.1 K/uL (ref 4.0–10.5)
nRBC: 0 % (ref 0.0–0.2)

## 2023-10-15 LAB — HIV ANTIBODY (ROUTINE TESTING W REFLEX): HIV Screen 4th Generation wRfx: NONREACTIVE

## 2023-10-15 LAB — MRSA NEXT GEN BY PCR, NASAL: MRSA by PCR Next Gen: NOT DETECTED

## 2023-10-15 MED ORDER — BUSPIRONE HCL 5 MG PO TABS
15.0000 mg | ORAL_TABLET | Freq: Three times a day (TID) | ORAL | Status: DC
  Administered 2023-10-15 – 2023-10-16 (×4): 15 mg via ORAL
  Filled 2023-10-15 (×4): qty 1

## 2023-10-15 MED ORDER — PRAZOSIN HCL 1 MG PO CAPS
1.0000 mg | ORAL_CAPSULE | Freq: Every day | ORAL | Status: DC
  Administered 2023-10-15: 1 mg via ORAL
  Filled 2023-10-15 (×3): qty 1

## 2023-10-15 MED ORDER — POTASSIUM CHLORIDE 10 MEQ/100ML IV SOLN
10.0000 meq | INTRAVENOUS | Status: AC
  Administered 2023-10-15 (×4): 10 meq via INTRAVENOUS
  Filled 2023-10-15 (×4): qty 100

## 2023-10-15 MED ORDER — RISPERIDONE 1 MG PO TABS
2.0000 mg | ORAL_TABLET | Freq: Three times a day (TID) | ORAL | Status: DC
  Administered 2023-10-15 – 2023-10-16 (×4): 2 mg via ORAL
  Filled 2023-10-15 (×4): qty 2

## 2023-10-15 MED ORDER — POLYVINYL ALCOHOL 1.4 % OP SOLN: 1.0000 [drp] | Freq: Three times a day (TID) | OPHTHALMIC | Status: DC | PRN

## 2023-10-15 MED ORDER — ONDANSETRON HCL 4 MG/2ML IJ SOLN
4.0000 mg | Freq: Four times a day (QID) | INTRAMUSCULAR | Status: DC | PRN
  Administered 2023-10-15 – 2023-10-16 (×4): 4 mg via INTRAVENOUS
  Filled 2023-10-15 (×4): qty 2

## 2023-10-15 MED ORDER — MIRTAZAPINE 15 MG PO TABS
15.0000 mg | ORAL_TABLET | Freq: Every day | ORAL | Status: DC
  Administered 2023-10-15: 15 mg via ORAL
  Filled 2023-10-15: qty 1

## 2023-10-15 MED ORDER — DICYCLOMINE HCL 10 MG PO CAPS
10.0000 mg | ORAL_CAPSULE | Freq: Two times a day (BID) | ORAL | Status: DC | PRN
  Administered 2023-10-15: 10 mg via ORAL
  Filled 2023-10-15: qty 1

## 2023-10-15 MED ORDER — FLUTICASONE FUROATE-VILANTEROL 100-25 MCG/ACT IN AEPB
1.0000 | INHALATION_SPRAY | Freq: Every day | RESPIRATORY_TRACT | Status: DC
  Administered 2023-10-16: 1 via RESPIRATORY_TRACT
  Filled 2023-10-15: qty 28

## 2023-10-15 MED ORDER — OXYCODONE HCL 5 MG PO TABS
10.0000 mg | ORAL_TABLET | Freq: Four times a day (QID) | ORAL | Status: DC | PRN
  Administered 2023-10-15 – 2023-10-16 (×5): 10 mg via ORAL
  Filled 2023-10-15 (×5): qty 2

## 2023-10-15 MED ORDER — SODIUM CHLORIDE 0.9% FLUSH: 10.0000 mL | INTRAVENOUS | Status: DC | PRN

## 2023-10-15 MED ORDER — TRAZODONE HCL 50 MG PO TABS
150.0000 mg | ORAL_TABLET | Freq: Every day | ORAL | Status: DC
  Administered 2023-10-15: 150 mg via ORAL
  Filled 2023-10-15: qty 1

## 2023-10-15 MED ORDER — FLUTICASONE FUROATE-VILANTEROL 100-25 MCG/ACT IN AEPB
1.0000 | INHALATION_SPRAY | Freq: Every day | RESPIRATORY_TRACT | Status: DC
  Filled 2023-10-15: qty 28

## 2023-10-15 MED ORDER — NICOTINE 14 MG/24HR TD PT24
14.0000 mg | MEDICATED_PATCH | Freq: Every day | TRANSDERMAL | Status: DC
  Administered 2023-10-15 – 2023-10-16 (×2): 14 mg via TRANSDERMAL
  Filled 2023-10-15 (×2): qty 1

## 2023-10-15 MED ORDER — PROPYLENE GLYCOL (PF) 0.6 % OP SOLN: 1.0000 [drp] | Freq: Three times a day (TID) | OPHTHALMIC | Status: DC | PRN

## 2023-10-15 MED ORDER — NALOXONE HCL 4 MG/0.25ML NA LIQD: 1.0000 | NASAL | Status: DC

## 2023-10-15 MED ORDER — CLONAZEPAM 1 MG PO TABS
1.0000 mg | ORAL_TABLET | Freq: Two times a day (BID) | ORAL | Status: DC
  Administered 2023-10-15 – 2023-10-16 (×2): 1 mg via ORAL
  Filled 2023-10-15 (×2): qty 2

## 2023-10-15 MED ORDER — VORTIOXETINE HBR 5 MG PO TABS
5.0000 mg | ORAL_TABLET | Freq: Every day | ORAL | Status: DC
  Administered 2023-10-15 – 2023-10-16 (×2): 5 mg via ORAL
  Filled 2023-10-15 (×2): qty 1

## 2023-10-15 MED ORDER — TAMSULOSIN HCL 0.4 MG PO CAPS
0.4000 mg | ORAL_CAPSULE | Freq: Every day | ORAL | Status: DC
  Administered 2023-10-15 – 2023-10-16 (×2): 0.4 mg via ORAL
  Filled 2023-10-15 (×2): qty 1

## 2023-10-15 MED ORDER — ACETAMINOPHEN 325 MG PO TABS: 650.0000 mg | ORAL_TABLET | Freq: Four times a day (QID) | ORAL | Status: DC | PRN

## 2023-10-15 MED ORDER — HYDROXYZINE HCL 25 MG PO TABS
25.0000 mg | ORAL_TABLET | Freq: Three times a day (TID) | ORAL | Status: DC
  Administered 2023-10-15 – 2023-10-16 (×4): 25 mg via ORAL
  Filled 2023-10-15 (×5): qty 1

## 2023-10-15 NOTE — Plan of Care (Signed)
   Problem: Elimination: Goal: Will not experience complications related to bowel motility Outcome: Progressing Goal: Will not experience complications related to urinary retention Outcome: Progressing   Problem: Pain Managment: Goal: General experience of comfort will improve and/or be controlled Outcome: Progressing   Problem: Safety: Goal: Ability to remain free from injury will improve Outcome: Progressing

## 2023-10-15 NOTE — Consult Note (Signed)
 Reason for Consult: Multiple GI complaints Referring Physician: Hospital team  [name] is an 48 y.o. female.  HPI: Patient seen and examined and her hospital computer chart including care everywhere was reviewed and she says when she had her feeding tube she gained up to 120 pounds and now she weighs 52 and she stopped using the feeding tube because she was homeless and did not have electricity and since they were feeding her small bowel it sounds like she needed the pump and she said she did not vomit when she was on tube feeds and did not hurt and she says the only time she does not hurt is when she is asleep and her previous workup was reviewed and the hospital team mention something about a manometry which she does not know anything about and she has seen multiple gastroenterologist and currently is followed at [facility] but I could not find a barium swallow but all other tests have been nonrevealing Past Medical History:  Diagnosis Date   Anal fissure 03/11/2009   Anemia    Anxiety and depression    ARTHRITIS 08/31/2007   Asthma    BENZODIAZEPINE ADDICTION 08/31/2007   Bipolar 1 disorder (HCC)    Bowel obstruction (HCC)    Breakdown of urinary electronic stimulator device, init (HCC)    BRONCHITIS, RECURRENT 08/23/2009   Asthmatic Bronchitis-Dr. Darlean.....-HFA 75% 12/04/2008>75% 02/05/2009>75% 08/04/2009 -PFT's 01/04/2009 2.56 (86%) ratio 75, no resp to B2 and DLC0 67% > 80 after correction    Cancer (HCC)    cervical cancer   Chronic interstitial cystitis 03/11/2009   Chronic nausea    Chronic pain    COLONIC POLYPS, HX OF 07/25/2006   ADENOMATOUS POLYP   COPD (chronic obstructive pulmonary disease) (HCC)    Endometriosis    FIBROMYALGIA 08/31/2007   GERD 02/05/2009   History of kidney stones    Hyperlipidemia    HYPERTENSION 08/31/2007   IBS 03/11/2009   Internal hemorrhoids    Migraine headache    Pancreatitis    PONV (postoperative nausea and vomiting)    RECTAL BLEEDING  03/11/2009   Sciatica    right leg   Seizures (HCC)    been about 1 year since last seisure per pt   Substance abuse (HCC)    Thyroid  disease    Uterine cyst     Past Surgical History:  Procedure Laterality Date   ABDOMINAL HYSTERECTOMY     BIOPSY  10/25/2018   Procedure: BIOPSY;  Surgeon: Golda Claudis PENNER, MD;  [facility];  Service: Endoscopy;;  gastric   BIOPSY  06/11/2020   Procedure: BIOPSY;  Surgeon: Eartha Angelia Sieving, MD;  [facility];  Service: Gastroenterology;;  small bowel   BIOPSY  10/26/2022   Procedure: BIOPSY;  Surgeon: Cinderella Deatrice FALCON, MD;  [facility];  Service: Endoscopy;;   bladder stretching x6     BLADDER SURGERY     stimulator placed and stretching    CHOLECYSTECTOMY     COLONOSCOPY     COLONOSCOPY WITH PROPOFOL  N/A 09/03/2014   Procedure: COLONOSCOPY WITH PROPOFOL ;  Surgeon: Sieving SHAUNNA Cedar, MD;  [facility];  Service: Endoscopy;  Laterality: N/A;   COLONOSCOPY WITH PROPOFOL  N/A 05/20/2021   Procedure: COLONOSCOPY WITH PROPOFOL ;  Surgeon: Eartha Angelia Sieving, MD;  [facility];  Service: Gastroenterology;  Laterality: N/A;  10:10 / Asa 2 pt states she has had a hysterectomy   ESOPHAGOGASTRODUODENOSCOPY (EGD) WITH PROPOFOL  N/A 10/25/2018  Procedure: ESOPHAGOGASTRODUODENOSCOPY (EGD) WITH PROPOFOL ;  Surgeon: Golda Claudis PENNER, MD;  [facility];  Service: Endoscopy;  Laterality: N/A;  11:15   ESOPHAGOGASTRODUODENOSCOPY (EGD) WITH PROPOFOL  N/A 06/11/2020   Procedure: ESOPHAGOGASTRODUODENOSCOPY (EGD) WITH PROPOFOL ;  Surgeon: Eartha Angelia Sieving, MD;  [facility];  Service: Gastroenterology;  Laterality: N/A;   ESOPHAGOGASTRODUODENOSCOPY (EGD) WITH PROPOFOL  N/A 10/26/2022   Procedure: ESOPHAGOGASTRODUODENOSCOPY (EGD) WITH PROPOFOL ;  Surgeon: Cinderella Deatrice FALCON, MD;  [facility];  Service: Endoscopy;  Laterality: N/A;  9:00am;asa 3   interstitial cystitis      IR GASTROSTOMY TUBE MOD SED  01/27/2021   PACEMAKER INSERTION     in hip for interstitial cystitis   pacemaker removal     PEG TUBE PLACEMENT     POLYPECTOMY  05/20/2021   Procedure: POLYPECTOMY;  Surgeon: Eartha Angelia, Sieving, MD;  [facility];  Service: Gastroenterology;;   removal of uterine cyst and scrapped uterus     replaced bladder pacemaker      Family History  Problem Relation Age of Onset   Heart disease Father    Asthma Maternal Grandmother    Emphysema Maternal Grandfather    Cancer Maternal Grandfather        Lung Cancer   Cancer Other        Lung Cancer-Aunt   Colon cancer Neg Hx    Esophageal cancer Neg Hx    Rectal cancer Neg Hx    Stomach cancer Neg Hx    Thyroid  disease Neg Hx     Social History:  reports that she has been smoking cigarettes. She has a 14 pack-year smoking history. She has never used smokeless tobacco. She reports that she does not currently use alcohol . She reports current drug use. Drug: Marijuana.  Allergies:  Allergies  Allergen Reactions   Abilify  [Aripiprazole ] Swelling, Palpitations and Other (See Comments)    Throat swelling, tremors    Amitriptyline Anaphylaxis, Swelling and Other (See Comments)    Angioedema   Metoclopramide Hcl Other (See Comments)    Causes seizures   Propoxyphene Hives, Itching, Dermatitis and Rash   Toradol  [Ketorolac  Tromethamine ]    Tramadol  Swelling, Other (See Comments) and Rash    Throat swelling, tremors   Eszopiclone Other (See Comments)    Hallucinations, hyperactivity, and bad taste in mouth    Fentanyl  Itching    Itching, tremors, tachycardia.   Nicoderm [Nicotine ] Other (See Comments)    Can tolerate only certain ones- itching and dizziness   Seroquel  [Quetiapine ] Other (See Comments)    Felt too lethargic the day after taking @ 400 mg   Ubrogepant Other (See Comments)    [place]- for migraines- My legs would not work   Varenicline  Other (See Comments)     Suicidal thoughts   Buprenorphine Hcl Itching and Hives   Demerol [Meperidine] Rash   Emetrol Hives, Itching, Dermatitis and Rash   Morphine And Codeine  Hives and Itching    Patient states she is able to tolerate Dilaudid .    Medications: I have reviewed the patient's current medications.  Results for orders placed or performed during the hospital encounter of 10/14/23 (from the past 48 hours)  CBC with Differential     Status: Abnormal   Collection Time: 10/14/23  1:05 PM  Result Value Ref Range   WBC 10.0 4.0 - 10.5 K/uL   RBC 4.96 3.87 - 5.11 MIL/uL   Hemoglobin 14.6 12.0 - 15.0 g/dL   HCT 56.2 63.9 -  46.0 %   MCV 88.1 80.0 - 100.0 fL   MCH 29.4 26.0 - 34.0 pg   MCHC 33.4 30.0 - 36.0 g/dL   RDW 87.3 88.4 - 84.4 %   Platelets 435 (H) 150 - 400 K/uL   nRBC 0.0 0.0 - 0.2 %   Neutrophils Relative % 70 %   Neutro Abs 7.1 1.7 - 7.7 K/uL   Lymphocytes Relative 26 %   Lymphs Abs 2.6 0.7 - 4.0 K/uL   Monocytes Relative 3 %   Monocytes Absolute 0.3 0.1 - 1.0 K/uL   Eosinophils Relative 0 %   Eosinophils Absolute 0.0 0.0 - 0.5 K/uL   Basophils Relative 1 %   Basophils Absolute 0.1 0.0 - 0.1 K/uL   Immature Granulocytes 0 %   Abs Immature Granulocytes 0.04 0.00 - 0.07 K/uL    Comment: Performed at [facility], [address] [place], [place] [address]  Comprehensive metabolic panel     Status: Abnormal   Collection Time: 10/14/23  1:05 PM  Result Value Ref Range   Sodium 138 135 - 145 mmol/L   Potassium 4.2 3.5 - 5.1 mmol/L   Chloride 99 98 - 111 mmol/L   CO2 23 22 - 32 mmol/L   Glucose, Bld 123 (H) 70 - 99 mg/dL    Comment: Glucose reference range applies only to samples taken after fasting for at least 8 hours.   BUN 14 6 - 20 mg/dL   Creatinine, Ser 9.16 0.44 - 1.00 mg/dL   Calcium 89.7 8.9 - 89.6 mg/dL   Total Protein 8.2 (H) 6.5 - 8.1 g/dL   Albumin  4.9 3.5 - 5.0 g/dL   AST 30 15 - 41 U/L   ALT 22 0 - 44 U/L   Alkaline Phosphatase 112 38 - 126 U/L    Total Bilirubin 0.3 0.0 - 1.2 mg/dL   GFR, Estimated >39 >39 mL/min    Comment: (NOTE) Calculated using the CKD-EPI Creatinine Equation (2021)    Anion gap 16 (H) 5 - 15    Comment: Performed at [facility], [address] [place], [place] [address]  Lipase, blood     Status: None   Collection Time: 10/14/23  1:05 PM  Result Value Ref Range   Lipase 45 11 - 51 U/L    Comment: Performed at [facility], [address] [place], [place] [address]  Lactic acid, plasma     Status: None   Collection Time: 10/14/23  1:30 PM  Result Value Ref Range   Lactic Acid, Venous 1.9 0.5 - 1.9 mmol/L    Comment: Performed at [facility], [address] [place], [place] [address]  Urinalysis, Routine w reflex microscopic -Urine, Clean Catch     Status: Abnormal   Collection Time: 10/14/23  3:55 PM  Result Value Ref Range   Color, Urine YELLOW YELLOW   APPearance HAZY (A) CLEAR   Specific Gravity, Urine 1.015 1.005 - 1.030   pH 6.0 5.0 - 8.0   Glucose, UA NEGATIVE NEGATIVE mg/dL   Hgb urine dipstick NEGATIVE NEGATIVE   Bilirubin Urine NEGATIVE NEGATIVE   Ketones, ur NEGATIVE NEGATIVE mg/dL   Protein, ur NEGATIVE NEGATIVE mg/dL   Nitrite NEGATIVE NEGATIVE   Leukocytes,Ua NEGATIVE NEGATIVE    Comment: Microscopic not done on urines with negative protein, blood, leukocytes, nitrite, or glucose < 500 mg/dL. Performed at [facility], [address] [place], [place] [address]   Phosphorus     Status: None   Collection Time: 10/14/23  8:30 PM  Result Value Ref Range   Phosphorus 3.9 2.5 - 4.6 mg/dL    Comment: Performed at [facility], [address], [place], [place] [address]  HIV Antibody (routine testing w rflx)     Status: None   Collection Time: 10/14/23  8:40 PM  Result Value Ref Range   HIV Screen 4th Generation wRfx Non Reactive Non Reactive    Comment: Performed at [facility], [address] [place], [place]  [address]  Magnesium      Status: None   Collection Time: 10/14/23  8:40 PM  Result Value Ref Range   Magnesium  1.8 1.7 - 2.4 mg/dL    Comment: Performed at [facility], [address] [place], [place] [address]  CBC     Status: Abnormal   Collection Time: 10/15/23  3:20 AM  Result Value Ref Range   WBC 9.1 4.0 - 10.5 K/uL   RBC 3.57 (L) 3.87 - 5.11 MIL/uL   Hemoglobin 10.7 (L) 12.0 - 15.0 g/dL   HCT 66.8 (L) 63.9 - 53.9 %   MCV 92.7 80.0 - 100.0 fL   MCH 30.0 26.0 - 34.0 pg   MCHC 32.3 30.0 - 36.0 g/dL   RDW 87.2 88.4 - 84.4 %   Platelets 284 150 - 400 K/uL   nRBC 0.0 0.0 - 0.2 %    Comment: Performed at [facility], [address] [place], [place] [address]  Comprehensive metabolic panel     Status: Abnormal   Collection Time: 10/15/23  3:20 AM  Result Value Ref Range   Sodium 137 135 - 145 mmol/L   Potassium 3.3 (L) 3.5 - 5.1 mmol/L   Chloride 105 98 - 111 mmol/L   CO2 25 22 - 32 mmol/L   Glucose, Bld 81 70 - 99 mg/dL    Comment: Glucose reference range applies only to samples taken after fasting for at least 8 hours.   BUN 11 6 - 20 mg/dL   Creatinine, Ser 9.29 0.44 - 1.00 mg/dL   Calcium 8.5 (L) 8.9 - 10.3 mg/dL   Total Protein 5.7 (L) 6.5 - 8.1 g/dL   Albumin  2.9 (L) 3.5 - 5.0 g/dL   AST 24 15 - 41 U/L   ALT 16 0 - 44 U/L   Alkaline Phosphatase 61 38 - 126 U/L   Total Bilirubin 0.5 0.0 - 1.2 mg/dL   GFR, Estimated >39 >39 mL/min    Comment: (NOTE) Calculated using the CKD-EPI Creatinine Equation (2021)    Anion gap 7 5 - 15    Comment: Performed at [facility], [address] [place], [place] [address]  MRSA Next Gen by PCR, Nasal     Status: None   Collection Time: 10/15/23  1:08 PM   Specimen: Nasal Mucosa; Nasal Swab  Result Value Ref Range   MRSA by PCR Next Gen NOT DETECTED NOT DETECTED    Comment: (NOTE) The GeneXpert MRSA Assay (FDA approved for NASAL specimens only), is one component of a  comprehensive MRSA colonization surveillance program. It is not intended to diagnose MRSA infection nor to guide or monitor treatment for MRSA infections. Test performance is not FDA approved in patients less than 40 years old. Performed at [facility], [address] [place], [place] [address]     US  Abdomen Limited  RUQ (LIVER/GB) Result Date: 10/14/2023 CLINICAL DATA:  Common bile duct dilatation EXAM: ULTRASOUND ABDOMEN LIMITED RIGHT UPPER QUADRANT COMPARISON:  CT 10/14/2023 FINDINGS: Gallbladder: Surgically absent Common bile duct: Diameter: 9 mm maximum. Liver: Slight central intra hepatic biliary dilatation. No focal hepatic abnormality. Portal vein is patent on color Doppler imaging with normal direction of blood flow towards the liver. Other: None. IMPRESSION: Status post cholecystectomy. Mild dilatation of the common bile duct and central intrahepatic bile ducts, could be due to post cholecystectomy change but correlate with LFTs, follow-up MRCP if indicated Electronically Signed   By: Luke Bun M.D.   On: 10/14/2023 20:15   CT ABDOMEN PELVIS W CONTRAST Result Date: 10/14/2023 CLINICAL DATA:  Abdominal pain, vomiting, and diarrhea. Bowel obstruction suspected. EXAM: CT ABDOMEN AND PELVIS WITH CONTRAST TECHNIQUE: Multidetector CT imaging of the abdomen and pelvis was performed using the standard protocol following bolus administration of intravenous contrast. RADIATION DOSE REDUCTION: This exam was performed according to the departmental dose-optimization program which includes automated exposure control, adjustment of the mA and/or kV according to patient size and/or use of iterative reconstruction technique. CONTRAST:  50mL OMNIPAQUE  IOHEXOL  300 MG/ML  SOLN COMPARISON:  09/01/2022. FINDINGS: Lower chest: No acute abnormality. Hepatobiliary: No focal abnormality in the liver. Fatty infiltration of the liver is noted. There is intrahepatic and extrahepatic biliary ductal  dilatation in the common bile duct is distended measuring 1.6 cm. The gallbladder is surgically absent. Pancreas: Unremarkable. No pancreatic ductal dilatation or surrounding inflammatory changes. Spleen: Normal in size without focal abnormality. Adrenals/Urinary Tract: The adrenal glands are within normal limits. The kidneys enhance symmetrically. No renal calculus or hydronephrosis bilaterally. The bladder is unremarkable. Stomach/Bowel: The stomach is within normal limits. No bowel obstruction, free air, or pneumatosis is seen. A few scattered diverticula are seen along the colon without evidence of diverticulitis. Appendix appears normal. Vascular/Lymphatic: No significant vascular findings are present. No enlarged abdominal or pelvic lymph nodes. Reproductive: Status post hysterectomy. No adnexal masses. Other: No abdominopelvic ascites. Musculoskeletal: No acute osseous abnormality. Neurostimulator device is noted in the posterior left hip with lead terminating in the presacral space on the right. A chronic compression deformity is noted in the superior endplate at L5. IMPRESSION: 1. Status post cholecystectomy with distended common bile duct measuring 1.6 cm, increased from the prior exam. Ultrasound is suggested for further evaluation. 2. Hepatic steatosis. 3. Diverticulosis without diverticulitis. Electronically Signed   By: Leita Birmingham M.D.   On: 10/14/2023 14:52    ROS negative except above Blood pressure 111/83, pulse 81, temperature (!) 97.4 F (36.3 C), temperature source Oral, resp. rate 16, height 5' 4 (1.626 m), weight 33.6 kg, SpO2 97%. Physical Exam vital signs stable afebrile no acute distress she is tender everywhere on the abdomen without rebound soft otherwise labs and CT reviewed and okay previous endoscopy and gastric emptying okay  Assessment/Plan: Very difficult patient however I have not met too many patients who have requested feeding tubes but before we order it you should  call her primary gastroenterologist to confirm that the plan and then if they agree and take responsibility for it okay with me to place it and let IR know she will need a small bowel tube and supposedly she can go to to live with her mother and I do not know if home health care will be needed to be set up until they are comfortable she is using it properly but otherwise I would leave other workup like manometry barium  swallow etc. to her primary gastroenterologist and please call me if I can be of any further assistance with this hospital stay  [facility] 10/15/2023, 4:47 PM

## 2023-10-15 NOTE — Hospital Course (Signed)
 48yo with h/o bipolar d/o, cervical CA, COPD, HTN, seizure d/o, OSA, HLD, and chronic dysphagia due to food aversion and anxiety with prior G-tube that was removed due to infection presenting with intractable n/v.  She is scheduled for repeat G-tube placement in August.  GI consulted.

## 2023-10-15 NOTE — TOC Initial Note (Addendum)
 Transition of Care [facility]) - Initial/Assessment Note    Patient Details  Name: [name] MRN: [id number] Date of Birth: 04/10/1976  [facility]) CM/SW Contact:    Doneta Glenys DASEN, RN Phone Number: 10/15/2023, 2:17 PM  Clinical Narrative:                 CM spoke to pt in room; pt says she lives in a mobile home w/ her mother and sister; pt says she plans to return at d/c; her mother will provide transportation; she verified insurance/PCP; he denies SDOH risks; pt says she does not have DME, HH services, or home oxygen ; pt states uses furniture to hold on to when ambulating at home. TOC following for DME and HH needs  Expected Discharge Plan: Home/Self Care Barriers to Discharge: Continued Medical Work up   Patient Goals and CMS Choice Patient states their goals for this hospitalization and ongoing recovery are:: Home CMS Medicare.gov Compare Post Acute Care list provided to::  (NA) Choice offered to / list presented to : NA  ownership interest in [facility].provided to:: Parent NA    Expected Discharge Plan and Services In-house Referral: NA Discharge Planning Services: CM Consult Post Acute Care Choice: NA Living arrangements for the past 2 months: Mobile Home                 DME Arranged: N/A DME Agency: NA       HH Arranged: NA HH Agency: NA        Prior Living Arrangements/Services Living arrangements for the past 2 months: Mobile Home Lives with:: Relatives, Parents Patient language and need for interpreter reviewed:: Yes Do you feel safe going back to the place where you live?: Yes      Need for Family Participation in Patient Care: No (Comment) Care giver support system in place?: Yes (comment) Current home services:  (NA) Criminal Activity/Legal Involvement Pertinent to Current Situation/Hospitalization: No - Comment as needed  Activities of Daily Living   ADL Screening (condition at time of admission) Independently  performs ADLs?: Yes (appropriate for developmental age) Is the patient deaf or have difficulty hearing?: No Does the patient have difficulty seeing, even when wearing glasses/contacts?: No Does the patient have difficulty concentrating, remembering, or making decisions?: No  Permission Sought/Granted Permission sought to share information with : Case Manager Permission granted to share information with : Yes, Verbal Permission Granted              Emotional Assessment Appearance:: Appears stated age Attitude/Demeanor/Rapport: Engaged Affect (typically observed): Appropriate Orientation: : Oriented to Self, Oriented to Place, Oriented to  Time, Oriented to Situation Alcohol  / Substance Use: Not Applicable Psych Involvement: No (comment)  Admission diagnosis:  Generalized abdominal pain [R10.84] Failure to thrive  in adult [R62.7] Intractable nausea and vomiting [R11.2] Patient Active Problem List   Diagnosis Date Noted   Continuous dependence on cigarette smoking 10/15/2023   Intractable nausea and vomiting 10/14/2023   History of dysphagia 10/14/2023   Chronic sinus bradycardia 10/14/2023   Hypotension 10/14/2023   Anorexia nervosa 10/14/2023   PEG (percutaneous endoscopic gastrostomy) status (HCC) 03/06/2023   Pneumothorax on left 09/01/2022   Pancreatic insufficiency 04/26/2021   Dysphagia    UTI (urinary tract infection) 01/21/2021   Thrombocytosis 01/21/2021   Elevated lipase 01/21/2021   Hypokalemia 01/21/2021   Fibromyalgia 01/21/2021   History of IBS 01/21/2021   Nausea 05/24/2020   Abdominal cramping 05/24/2020   Severe protein-calorie malnutrition (  HCC) 05/24/2020   Encounter by telehealth for suspected COVID-19 03/20/2019   Abdominal pain, epigastric 10/07/2018   Intractable vomiting 08/09/2018   Compression fracture of fifth lumbar vertebra (HCC) 02/07/2018   Altered mental status 02/07/2018   Generalized weakness 01/24/2018   MDD (major depressive  disorder) 04/23/2017   Substance abuse (HCC) 04/19/2017   Hyperthyroidism 02/17/2015   Anxiety 01/28/2015   Depression 01/28/2015   COPD with acute exacerbation (HCC) 11/26/2013   Vaginitis and vulvovaginitis 11/26/2013   Recurrent pneumonia 02/02/2013   Left arm pain 01/13/2013   Left arm pain 01/13/2013    Class: Acute   Dizziness and giddiness 01/13/2013   Acute bronchitis 12/07/2012   Rib pain 12/07/2012   Insomnia 08/26/2012   Diarrhea 06/19/2012   Heme positive stool 06/19/2012   Bloating 06/19/2012   Nausea alone 06/19/2012   Bladder retention 04/24/2012   Tremor 03/05/2012   Dysuria 03/05/2012   Failure to thrive  in adult 06/21/2011   Airway hyperreactivity 03/08/2011   Back pain, chronic 03/08/2011   COPD (chronic obstructive pulmonary disease) (HCC) 03/08/2011   GERD (gastroesophageal reflux disease) 03/08/2011   Cystitis 01/22/2011   Bilateral hand numbness 01/13/2011   WEIGHT LOSS 06/24/2010   RIB PAIN, LEFT SIDED 05/04/2010   ABDOMINAL PAIN, LEFT UPPER QUADRANT 05/04/2010   Otitis media 10/26/2009   BRONCHITIS, RECURRENT 08/23/2009   Acute upper respiratory infection 08/04/2009   FATIGUE 07/06/2009   Irritable bowel syndrome with both constipation and diarrhea 03/11/2009   ANAL FISSURE 03/11/2009   RECTAL BLEEDING 03/11/2009   Chronic interstitial cystitis 03/11/2009   History of colonic polyps 03/11/2009   GERD 02/05/2009   SMOKER 12/04/2008   chronic asthma poorly controlled 12/04/2008   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   Sedative, 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   Bipolar disorder (HCC) 08/31/2007   Essential hypertension 08/31/2007   EMPHYSEMA 08/31/2007   NEPHROLITHIASIS 08/31/2007   Arthropathy 08/31/2007   FIBROMYALGIA 08/31/2007   Sleep apnea 08/31/2007   PCP:  Orpha Yancey LABOR, MD Pharmacy:   [facility] [address], [place] - [address] [place] [address] Phone: [phone]  Fax: [phone]     Social Drivers of Health (SDOH) Social History: SDOH Screenings   Food Insecurity: Food Insecurity Present (10/14/2023)  Housing: High Risk (10/14/2023)  Transportation Needs: Unmet Transportation Needs (10/14/2023)  Utilities: Not At Risk (10/14/2023)  Alcohol  Screen: Low Risk  (04/23/2017)  Depression (PHQ2-9): Medium Risk (02/02/2020)  Tobacco Use: High Risk (10/14/2023)   SDOH Interventions:     Readmission Risk Interventions    02/01/2021    8:38 AM  Readmission Risk Prevention Plan  Transportation Screening Complete  Medication Review (RN Care Manager) Complete  PCP or Specialist appointment within 3-5 days of discharge Complete  HRI or Home Care Consult Complete  SW Recovery Care/Counseling Consult Complete  Palliative Care Screening Not Applicable  Skilled Nursing Facility Not Applicable

## 2023-10-15 NOTE — Progress Notes (Signed)
 Progress Note   Patient: [name] FMW:[id number] DOB: 04-02-1976 DOA: 10/14/2023     0 DOS: the patient was seen and examined on 10/15/2023   Brief hospital course: 48yo with h/o bipolar d/o, cervical CA, COPD, HTN, seizure d/o, OSA, HLD, and chronic dysphagia due to food aversion and anxiety with prior G-tube that was removed due to infection presenting with intractable n/v.  She is scheduled for repeat G-tube placement in August.  GI consulted.  Assessment and Plan:  Intractable nausea, vomiting  Patient with h/o intractable n/v with chronic dysphagia She was previously on G-tube feeds but reports that this had to be removed due to homelessness and inability to care for it/receive tube feeds She is now living with her mother and would like to have the tube replaced Patient has been scheduled for tube feeding placement by IR in August 2025 by [facility] CT abdomen pelvis showed status post cholecystectomy common bile duct dilation 1.6 cm increased as compared to prior exam recommended us .  Hepatic steatosis.  Diverticulosis without diverticulitis. Continue Protonix  40 mg twice daily, Zofran  as needed and Tigan  for refractory nausea vomiting; continue Creon  3 times daily with meal. Per chart review previous EGD, colonoscopy normal findings. Previously being evaluated by psychiatry and it is seems like that dysphagia mostly related to underlying anxiety related and history of chronic food aversion.  Patient has long list of medication and food allergies as well. Consulted [facility] Dr. Rosalie for evaluation; if PEG tube placement is recommended, will consult IR for placement Meanwhile, she is tolerating clear liquids at this time  If she continues to tolerate clears, it may be reasonable to dc to home on 7/8 with plan for f/u with her GI at [facility]  Chronic abdominal pain I have reviewed this patient in the [place] Controlled Substances Reporting System.  She is receiving medications from multiple  providers and needs to be closely monitored  She IS at particularly high risk of opioid misuse, diversion, or overdose.  She has been managed with chronic opiates but this is generally NOT the preferred management of chronic abdominal pain since it can increase issues with constipation Will give oxy 10 mg no more frequently than q8h and recommend weaning off this medication either inpatient or outpatient Continue gabapentin    History of bipolar disorder Per chart review patient is on 4 different antipsychotic, antianxiety and depressive medications Plan to continue Klonopin  0.5 mg 3 times daily for anxiety management She reports seeing psychiatry monthly at [facility] Consider inpatient psych evaluation She reports taking Strattera  but this has not been prescribed for her in the last 6 months per PDMP and will be discontinued Continue buspirone , hydroxyzine , mirtazepine, Risperdal , trazodone , and Trintellix    Chronic sinus bradycardia Holding metoprolol  in the setting of bradycardia/marginal BPs Improved HR, may be able to resume metoprolol  soon   COPD Continue Symbicort  (Breo per formulary) and DuoNeb/Albuterol  as needed   History of seizure She does not appear to be taking medications for this issue at this time    Peripheral neuropathy Continue gabapentin  800 mg twice daily   Protein calorie malnutrition Anorexia nervosa Low BMI 12.7 and patient is bradycardic History of chronic food aversion secondary to anxiety, depression and bipolar related   Cigarette smoking Patient continues to smoke 1ppd; she reports that this is down from 3.5 ppd Requesting for nicotine  patch     Consultants: GI PT OT SLP TOC team  Procedures: None  Antibiotics: None  Subjective: Reports severe abdominal pain, chronic.  Asking for her home medications.  Able to tolerate some clears but reports that she cannot advance diet and needs a feeding tube again.   It was last  removed because she was homeless but she now lives with her mother.  She reports seeing her psychiatrist once a month.   Objective: Vitals:   10/15/23 1009 10/15/23 1252  BP: 102/68 111/83  Pulse: 77 81  Resp:  16  Temp:  (!) 97.4 F (36.3 C)  SpO2: 96% 97%    Intake/Output Summary (Last 24 hours) at 10/15/2023 1459 Last data filed at 10/15/2023 1217 Gross per 24 hour  Intake 2918.1 ml  Output --  Net 2918.1 ml   Filed Weights   10/14/23 1243  Weight: 33.6 kg    Exam:  General:  Appears chronically ill, cachectic Eyes:  normal lids, iris ENT:  grossly normal hearing, lips & tongue, mmm Cardiovascular:  RRR. No LE edema.  Respiratory:   CTA bilaterally with no wheezes/rales/rhonchi.  Normal respiratory effort. Abdomen:  soft, reports severe diffuse TTP (less apparent when she is distracted), ND Skin:  no rash or induration seen on limited exam Musculoskeletal:  grossly normal tone BUE/BLE, good ROM, no bony abnormality Psychiatric:  anxious mood and affect, speech fluent and appropriate, AOx3 Neurologic:  CN 2-12 grossly intact, moves all extremities in coordinated fashion  Data Reviewed: I have reviewed the patient's lab results since admission.  Pertinent labs for today include:   K+ 3.3 Albumin  2.9 WBC 9.1 Hgb 10.7     Family Communication: None present  Disposition: Status is: Observation The patient remains OBS appropriate and will d/c before 2 midnights.     Time spent: 50 minutes  Unresulted Labs (From admission, onward)     Start     Ordered   10/16/23 0500  CBC with Differential/Platelet  Tomorrow morning,   R       Question:  Specimen collection method  Answer:  IV Team=IV Team collect   10/15/23 1459   10/16/23 0500  Basic metabolic panel with GFR  Tomorrow morning,   R       Question:  Specimen collection method  Answer:  IV Team=IV Team collect   10/15/23 1459             Author: Delon Herald, MD 10/15/2023 2:59 PM  For on call  review [email or web address].

## 2023-10-15 NOTE — Progress Notes (Signed)
 SLP Cancellation and D/C Note  Patient Details Name: [name] MRN: [id number] DOB: Aug 05, 1975   Cancelled evaluation: Pt finally sleeping soundly after difficult day per RN. Reviewed history, including thorough MBS from 09/06/22. Pt with hx of food aversions, but no oropharyngeal dysphagia.  Nurse reports she has swallowed liquids and her meds without any difficulties. G-tube is planned for later next month per pt's preferences.  No swallowing assessment is warranted in this situation.  D/W nurse and Dr. Barbarann. Will sign off.  [name] L. [place]ona, MA CCC/SLP Clinical Specialist - Acute Care SLP Acute Rehabilitation Services Office number [phone]            [name] 10/15/2023, 4:25 PM

## 2023-10-16 DIAGNOSIS — R112 Nausea with vomiting, unspecified: Secondary | ICD-10-CM | POA: Diagnosis not present

## 2023-10-16 LAB — CBC WITH DIFFERENTIAL/PLATELET
Abs Immature Granulocytes: 0.03 K/uL (ref 0.00–0.07)
Basophils Absolute: 0.1 K/uL (ref 0.0–0.1)
Basophils Relative: 1 %
Eosinophils Absolute: 0.2 K/uL (ref 0.0–0.5)
Eosinophils Relative: 2 %
HCT: 32.2 % — ABNORMAL LOW (ref 36.0–46.0)
Hemoglobin: 10.3 g/dL — ABNORMAL LOW (ref 12.0–15.0)
Immature Granulocytes: 0 %
Lymphocytes Relative: 31 %
Lymphs Abs: 2.9 K/uL (ref 0.7–4.0)
MCH: 29.9 pg (ref 26.0–34.0)
MCHC: 32 g/dL (ref 30.0–36.0)
MCV: 93.6 fL (ref 80.0–100.0)
Monocytes Absolute: 0.4 K/uL (ref 0.1–1.0)
Monocytes Relative: 4 %
Neutro Abs: 5.8 K/uL (ref 1.7–7.7)
Neutrophils Relative %: 62 %
Platelets: 244 K/uL (ref 150–400)
RBC: 3.44 MIL/uL — ABNORMAL LOW (ref 3.87–5.11)
RDW: 12.7 % (ref 11.5–15.5)
WBC: 9.3 K/uL (ref 4.0–10.5)
nRBC: 0 % (ref 0.0–0.2)

## 2023-10-16 LAB — BASIC METABOLIC PANEL WITH GFR
Anion gap: 7 (ref 5–15)
BUN: 5 mg/dL — ABNORMAL LOW (ref 6–20)
CO2: 25 mmol/L (ref 22–32)
Calcium: 8.6 mg/dL — ABNORMAL LOW (ref 8.9–10.3)
Chloride: 111 mmol/L (ref 98–111)
Creatinine, Ser: 0.65 mg/dL (ref 0.44–1.00)
GFR, Estimated: >60 mL/min (ref >60)
Glucose, Bld: 77 mg/dL (ref 70–99)
Potassium: 3.5 mmol/L (ref 3.5–5.1)
Sodium: 143 mmol/L (ref 135–145)

## 2023-10-16 MED ORDER — NICOTINE 14 MG/24HR TD PT24: 14.0000 mg | MEDICATED_PATCH | Freq: Every day | TRANSDERMAL | 0 refills | Status: AC

## 2023-10-16 NOTE — Progress Notes (Signed)
 PT Cancellation Note  Patient Details Name: [name] MRN: [id number] DOB: 1975/07/21   Cancelled Treatment:    Reason Eval/Treat Not Completed: Pain limiting ability to participate. Pt reports 8/10 wringing like abdominal pain at rest and is unable to participate with therapy at this time. PT to continue to follow pt acutely.   [place], PT Acute Rehab   [place] VEAR Drone 10/16/2023, 12:20 PM

## 2023-10-16 NOTE — Progress Notes (Signed)
 Patient verbally requested to add her sister, Merlynn, to the contact list. Chart has been updated.

## 2023-10-16 NOTE — Discharge Summary (Signed)
 Physician Discharge Summary   Patient: [name] MRN: [id number] DOB: 01/14/1976  Admit date:     10/14/2023  Discharge date: 10/16/23  Discharge Physician: [name]   PCP: [name] LABOR, MD   Recommendations at discharge:   Continue to drink oral liquids to maintain hydration, supplemented with protein drinks for caloric intake Follow up at [facility] for G-tube placement and coordination of care - they will be contacting you to try to arrange for outpatient placement of the G-J tube PRIOR to your 8/6 appointment with Dr. Liza Follow up with your psychiatrist as scheduled Follow up with your pain management specialist for ongoing pain control issues Follow up with Dr. Orpha in 1-2 weeks Continue to hold metoprolol  for low due to low normal blood pressure and heart rate Stop smoking; nicotine  patch prescribed  Discharge Diagnoses: Principal Problem:   Intractable nausea and vomiting Active Problems:   Bipolar disorder (HCC)   COPD (chronic obstructive pulmonary disease) (HCC)   Depression   GERD (gastroesophageal reflux disease)   Fibromyalgia   History of IBS   Pancreatic insufficiency   History of dysphagia   Chronic sinus bradycardia   Hypotension   Anorexia nervosa   Continuous dependence on cigarette smoking    Hospital Course: 48yo with h/o bipolar d/o, cervical CA, COPD, HTN, seizure d/o, OSA, HLD, and chronic dysphagia due to food aversion and anxiety with prior G-tube that was removed due to infection presenting with intractable n/v.  She is scheduled for repeat G-tube placement in August.  GI consulted.  Assessment and Plan:  Intractable nausea, vomiting  Patient with h/o reported intractable n/v with chronic dysphagia She was previously on G-tube feeds but reports that this had to be removed due to homelessness and inability to care for it/receive tube feeds She is now living with her mother and would like to have the tube replaced Patient has  been scheduled for tube feeding placement by IR in August 2025 by [name] CT A/P showed status post cholecystectomy common bile duct dilation 1.6 cm increased as compared to prior exam.  Hepatic steatosis.  Diverticulosis without diverticulitis. US  with mild dilatation of CBD and center hepatic bile ducts, possible related to prior cholecystectomy Continue Protonix  40 mg twice daily, Zofran  as needed and Tigan  for refractory nausea vomiting; continue Creon  3 times daily with meal. Per chart review previous EGD, colonoscopy normal findings. Previously being evaluated by psychiatry and it is seems like that dysphagia mostly related to underlying anxiety related and history of chronic food aversion.  Patient has long list of medication and food allergies as well. Consulted [facility] Dr. Rosalie for evaluation She is tolerating clear liquids at this time  She has no hemodynamic compromise and no laboratory evidence of dehydration, including lack of ketones in urine at the time of admission She will require close outpatient team-based coordination of her care once the tube is placed via [name] Since she is maintaining adequate hydration, it is most appropriate to dc to home today with plan for f/u with her GI at [name] I have reached out to GI at [name] to try to expedite her appointment, as per patient/family request Needs outpatient GI f/u to consider whether additional imaging with MRCP is indicated Per [name], she will reach out to Dr. Liza to try to arrange for the G-J tube to be placed PRIOR to her f/u appointment in GI clinic there on 8/6 [name] will contact the patient to arrange  Chronic abdominal pain I have reviewed this patient in the [place] Controlled Substances Reporting System.  She is receiving medications from multiple providers and needs to be closely monitored  She IS at particularly high risk of opioid misuse, diversion, or overdose.  She has been managed with chronic opiates but  this is generally NOT the preferred management of chronic abdominal pain since it can increase issues with gastric motility Will give home medication with oxycodone  for now  Recommend weaning off this medication as an outpatient Continue gabapentin    History of bipolar disorder Per chart review patient is on 4 different antipsychotic, antianxiety and depressive medications Plan to continue Klonopin  0.5 mg 3 times daily for anxiety management She reports seeing psychiatry monthly at [facility] She reports taking Strattera  but this has not been prescribed for her in the last 6 months per PDMP and will be discontinued Continue buspirone , hydroxyzine , mirtazepine, Risperdal , trazodone , and Trintellix  F/u with outpatient psychiatry as scheduled   Chronic sinus bradycardia Holding metoprolol  in the setting of bradycardia/marginal BPs Improved HR, may be able to resume metoprolol  soon   COPD Continue Symbicort  (Breo per formulary) and DuoNeb/Albuterol  as needed   History of seizure She does not appear to be taking medications for this issue at this time    Peripheral neuropathy Continue gabapentin  800 mg twice daily   Protein calorie malnutrition Anorexia nervosa Low BMI 12.7 and patient has marginal bradycardia on presentation History of chronic food aversion secondary to anxiety, depression and bipolar related   Cigarette smoking Patient continues to smoke 1ppd; she reports that this is down from 3.5 ppd Requesting for nicotine  patch         Consultants: GI PT OT SLP TOC team   Procedures: None   Antibiotics: None    Pain control - [place]  Controlled Substance Reporting System database was reviewed. and patient was instructed, not to drive, operate heavy machinery, perform activities at heights, swimming or participation in water  activities or provide baby-sitting services while on Pain, Sleep and Anxiety Medications; until their outpatient Physician  has advised to do so again. Also recommended to not to take more than prescribed Pain, Sleep and Anxiety Medications.   Disposition: Home Diet recommendation:  Full liquid diet DISCHARGE MEDICATION: Allergies as of 10/16/2023       Reactions   Abilify  [aripiprazole ] Swelling, Palpitations, Other (See Comments)   Throat swelling, tremors   Amitriptyline Anaphylaxis, Swelling, Other (See Comments)   Angioedema   Metoclopramide Hcl Other (See Comments)   Causes seizures   Propoxyphene Hives, Itching, Dermatitis, Rash   Toradol  [ketorolac  Tromethamine ]    Tramadol  Swelling, Other (See Comments), Rash   Throat swelling, tremors   Eszopiclone Other (See Comments)   Hallucinations, hyperactivity, and bad taste in mouth   Fentanyl  Itching   Itching, tremors, tachycardia.   Nicoderm [nicotine ] Other (See Comments)   Can tolerate only certain ones- itching and dizziness   Seroquel  [quetiapine ] Other (See Comments)   Felt too lethargic the day after taking @ 400 mg   Ubrogepant Other (See Comments)   [place]- for migraines- My legs would not work   Varenicline  Other (See Comments)   Suicidal thoughts   Buprenorphine Hcl Itching, Hives   Demerol [meperidine] Rash   Emetrol Hives, Itching, Dermatitis, Rash   Morphine And Codeine  Hives, Itching   Patient states she is able to tolerate Dilaudid .        Medication List     PAUSE taking these medications  metoprolol  tartrate 25 MG tablet Wait to take this until your doctor or other care provider tells you to start again. Commonly known as: LOPRESSOR  Take 0.5 tablets (12.5 mg total) by mouth 2 (two) times daily. What changed: how much to take       STOP taking these medications    amoxicillin -clavulanate 875-125 MG tablet Commonly known as: AUGMENTIN    atomoxetine  10 MG capsule Commonly known as: STRATTERA    azithromycin  250 MG tablet Commonly known as: ZITHROMAX    cyanocobalamin  1000 MCG tablet Commonly known as:  VITAMIN B12   lidocaine  5 % Commonly known as: Lidoderm    Magnesium  250 MG Tabs   OLANZapine  5 MG tablet Commonly known as: ZYPREXA        TAKE these medications    budesonide -formoterol  80-4.5 MCG/ACT inhaler Commonly known as: Symbicort  Inhale 2 puffs into the lungs 2 (two) times daily. What changed: when to take this   busPIRone  15 MG tablet Commonly known as: BUSPAR  Take 1 tablet (15 mg total) by mouth 3 (three) times daily.   clonazePAM  1 MG tablet Commonly known as: KLONOPIN  Take 1 mg by mouth in the morning and at bedtime. What changed: Another medication with the same name was removed. Continue taking this medication, and follow the directions you see here.   dicyclomine  10 MG capsule Commonly known as: BENTYL  Take 10 mg by mouth 2 (two) times daily as needed for spasms.   Dotti  0.0375 MG/24HR Generic drug: estradiol  Place 1 patch onto the skin See admin instructions. Place 1 new patch onto the skin on Mondays and Thursdays   Excedrin Migraine 250-250-65 MG tablet Generic drug: aspirin-acetaminophen -caffeine Take 1 tablet by mouth every 6 (six) hours as needed for headache or migraine.   fluticasone  50 MCG/ACT nasal spray Commonly known as: FLONASE  Place 2 sprays into both nostrils daily. What changed:  when to take this reasons to take this   gabapentin  800 MG tablet Commonly known as: NEURONTIN  Take 0.5 tablets (400 mg total) by mouth in the morning, at noon, in the evening, and at bedtime. What changed: how much to take   hydrOXYzine  25 MG tablet Commonly known as: ATARAX  Take 25 mg by mouth 3 (three) times daily.   lipase/protease/amylase 63999 UNITS Cpep capsule Commonly known as: CREON  Take 1 capsule (36,000 Units total) by mouth 4 (four) times daily -  with meals and at bedtime.   mirtazapine  15 MG tablet Commonly known as: REMERON  Take 15 mg by mouth at bedtime.   Naloxone  HCl 4 MG/0.25ML Liqd Place 1 spray into the nose See admin  instructions. ADMINISTER 1 SPRAY EVERY 2 MINUTES AS NEEDED FOR OPIOID OVERDOSE; SPRAY 1 DOSE INTO ONE NOSTRIL; ALTERNATE NOSTRILS WITH EACH DOSE UNTIL HELP ARRIVES   nicotine  14 mg/24hr patch Commonly known as: NICODERM CQ  - dosed in mg/24 hours Place 1 patch (14 mg total) onto the skin daily. Start taking on: October 17, 2023 What changed:  medication strength how much to take when to take this reasons to take this   omeprazole  40 MG capsule Commonly known as: PRILOSEC Take 1 capsule (40 mg total) by mouth 2 (two) times daily. What changed: when to take this   ondansetron  4 MG tablet Commonly known as: ZOFRAN  Take 1 tablet (4 mg total) by mouth every 8 (eight) hours as needed for nausea or vomiting.   Oxycodone  HCl 10 MG Tabs Take 1-1.5 tablets (10-15 mg total) by mouth every 6 (six) hours as needed for breakthrough pain.  prazosin  1 MG capsule Commonly known as: MINIPRESS  Take 1 mg by mouth at bedtime.   promethazine  25 MG tablet Commonly known as: PHENERGAN  Take 0.5 tablets (12.5 mg total) by mouth every 6 (six) hours as needed for nausea or vomiting. What changed: Another medication with the same name was removed. Continue taking this medication, and follow the directions you see here.   risperiDONE  2 MG tablet Commonly known as: RISPERDAL  Take 2 mg by mouth in the morning, at noon, and at bedtime.   SUMAtriptan  50 MG tablet Commonly known as: IMITREX  Take 50 mg by mouth once as needed for migraine or headache (May repeat ONCE in 2 hours if headache persists or recurs).   Systane Complete PF 0.6 % Soln Generic drug: Propylene Glycol (PF) Place 1 drop into both eyes 3 (three) times daily as needed (for irritation).   tamsulosin  0.4 MG Caps capsule Commonly known as: FLOMAX  Take 0.4 mg by mouth daily after supper.   trazodone  300 MG tablet Commonly known as: DESYREL  Take 150 mg by mouth at bedtime.   Trintellix  5 MG Tabs tablet Generic drug: vortioxetine  HBr Take  5 mg by mouth daily.   Ventolin  HFA 108 (90 Base) MCG/ACT inhaler Generic drug: albuterol  Inhale 2 puffs into the lungs every 6 (six) hours as needed for wheezing or shortness of breath. What changed: Another medication with the same name was removed. Continue taking this medication, and follow the directions you see here.   Vitamin D  (Ergocalciferol ) 1.25 MG (50000 UNIT) Caps capsule Commonly known as: DRISDOL Take 50,000 Units by mouth every Monday.        Discharge Exam:    Subjective: Patient and family are quite frustrated that her chronic medical conditions are not being acutely addressed.  They went to the ER at [name] on 6/3 and 7/4 and did not require admission either time since these were not acute issues.  She had an outpatient nutrition telemedicine visit on 7/2 so her nutrition issues are also being cared for on an ongoing basis.  She is scheduled for a GI clinic appointment at [facility] on 8/6; per [facility] notes, she needs nutrition and GI consults prior to getting the feeding tube.  Patient also reports that her chronic abdominal pain is poorly controlled with current medications and we discussed the importance of addressing that issue with her chronic pain management specialist.  She specifically asked for IV medication x 1 prior to discharge and understands that this is not indicated and so will not be provided.  She is not actively vomiting and continues to tolerate PO.  I discussed patient with [name].  She will contact Dr. Liza and try to get her G-J tube placed while she is awaiting her appointment with Dr. Liza on 8/6.  [facility] will follow up with her.   Objective: Vitals:   10/16/23 0925 10/16/23 1202  BP:  99/67  Pulse:  72  Resp:    Temp:  98.5 F (36.9 C)  SpO2: 95% 95%    Intake/Output Summary (Last 24 hours) at 10/16/2023 1614 Last data filed at 10/16/2023 0700 Gross per 24 hour  Intake 570 ml  Output --  Net 570 ml   Filed Weights   10/14/23 1243   Weight: 33.6 kg    Exam:  General:  Appears chronically ill, cachectic Eyes:  normal lids, iris ENT:  grossly normal hearing, lips & tongue, mmm Cardiovascular:  RRR. No LE edema.  Respiratory:   CTA bilaterally  with no wheezes/rales/rhonchi.  Normal respiratory effort. Abdomen:  soft, reports severe diffuse TTP (less apparent when she is distracted), ND Skin:  no rash or induration seen on limited exam Musculoskeletal:  grossly normal tone BUE/BLE, good ROM, no bony abnormality Psychiatric:  anxious mood and affect, speech fluent and appropriate, AOx3 Neurologic:  CN 2-12 grossly intact, moves all extremities in coordinated fashion  Data Reviewed: I have reviewed the patient's lab results since admission.  Pertinent labs for today include:  Stable BMP WBC 9.3 Hgb 10.3, down from 10.7     Condition at discharge: fair  The results of significant diagnostics from this hospitalization (including imaging, microbiology, ancillary and laboratory) are listed below for reference.   Imaging Studies: US  Abdomen Limited RUQ (LIVER/GB) Result Date: 10/14/2023 CLINICAL DATA:  Common bile duct dilatation EXAM: ULTRASOUND ABDOMEN LIMITED RIGHT UPPER QUADRANT COMPARISON:  CT 10/14/2023 FINDINGS: Gallbladder: Surgically absent Common bile duct: Diameter: 9 mm maximum. Liver: Slight central intra hepatic biliary dilatation. No focal hepatic abnormality. Portal vein is patent on color Doppler imaging with normal direction of blood flow towards the liver. Other: None. IMPRESSION: Status post cholecystectomy. Mild dilatation of the common bile duct and central intrahepatic bile ducts, could be due to post cholecystectomy change but correlate with LFTs, follow-up MRCP if indicated Electronically Signed   By: Luke Bun M.D.   On: 10/14/2023 20:15   CT ABDOMEN PELVIS W CONTRAST Result Date: 10/14/2023 CLINICAL DATA:  Abdominal pain, vomiting, and diarrhea. Bowel obstruction suspected. EXAM: CT ABDOMEN  AND PELVIS WITH CONTRAST TECHNIQUE: Multidetector CT imaging of the abdomen and pelvis was performed using the standard protocol following bolus administration of intravenous contrast. RADIATION DOSE REDUCTION: This exam was performed according to the departmental dose-optimization program which includes automated exposure control, adjustment of the mA and/or kV according to patient size and/or use of iterative reconstruction technique. CONTRAST:  50mL OMNIPAQUE  IOHEXOL  300 MG/ML  SOLN COMPARISON:  09/01/2022. FINDINGS: Lower chest: No acute abnormality. Hepatobiliary: No focal abnormality in the liver. Fatty infiltration of the liver is noted. There is intrahepatic and extrahepatic biliary ductal dilatation in the common bile duct is distended measuring 1.6 cm. The gallbladder is surgically absent. Pancreas: Unremarkable. No pancreatic ductal dilatation or surrounding inflammatory changes. Spleen: Normal in size without focal abnormality. Adrenals/Urinary Tract: The adrenal glands are within normal limits. The kidneys enhance symmetrically. No renal calculus or hydronephrosis bilaterally. The bladder is unremarkable. Stomach/Bowel: The stomach is within normal limits. No bowel obstruction, free air, or pneumatosis is seen. A few scattered diverticula are seen along the colon without evidence of diverticulitis. Appendix appears normal. Vascular/Lymphatic: No significant vascular findings are present. No enlarged abdominal or pelvic lymph nodes. Reproductive: Status post hysterectomy. No adnexal masses. Other: No abdominopelvic ascites. Musculoskeletal: No acute osseous abnormality. Neurostimulator device is noted in the posterior left hip with lead terminating in the presacral space on the right. A chronic compression deformity is noted in the superior endplate at L5. IMPRESSION: 1. Status post cholecystectomy with distended common bile duct measuring 1.6 cm, increased from the prior exam. Ultrasound is suggested  for further evaluation. 2. Hepatic steatosis. 3. Diverticulosis without diverticulitis. Electronically Signed   By: Leita Birmingham M.D.   On: 10/14/2023 14:52    Microbiology: Results for orders placed or performed during the hospital encounter of 10/14/23  MRSA Next Gen by PCR, Nasal     Status: None   Collection Time: 10/15/23  1:08 PM   Specimen: Nasal Mucosa; Nasal Swab  Result Value Ref Range Status   MRSA by PCR Next Gen NOT DETECTED NOT DETECTED Final    Comment: (NOTE) The GeneXpert MRSA Assay (FDA approved for NASAL specimens only), is one component of a comprehensive MRSA colonization surveillance program. It is not intended to diagnose MRSA infection nor to guide or monitor treatment for MRSA infections. Test performance is not FDA approved in patients less than 67 years old. Performed at [facility], [address], [place], [place] [address]     Labs: CBC: Recent Labs  Lab 10/14/23 1305 10/15/23 0320 10/16/23 0440  WBC 10.0 9.1 9.3  NEUTROABS 7.1  --  5.8  HGB 14.6 10.7* 10.3*  HCT 43.7 33.1* 32.2*  MCV 88.1 92.7 93.6  PLT 435* 284 244   Basic Metabolic Panel: Recent Labs  Lab 10/14/23 1305 10/14/23 2030 10/14/23 2040 10/15/23 0320 10/16/23 0440  NA 138  --   --  137 143  K 4.2  --   --  3.3* 3.5  CL 99  --   --  105 111  CO2 23  --   --  25 25  GLUCOSE 123*  --   --  81 77  BUN 14  --   --  11 5*  CREATININE 0.83  --   --  0.70 0.65  CALCIUM 10.2  --   --  8.5* 8.6*  MG  --   --  1.8  --   --   PHOS  --  3.9  --   --   --    Liver Function Tests: Recent Labs  Lab 10/14/23 1305 10/15/23 0320  AST 30 24  ALT 22 16  ALKPHOS 112 61  BILITOT 0.3 0.5  PROT 8.2* 5.7*  ALBUMIN  4.9 2.9*   CBG: No results for input(s): GLUCAP in the last 168 hours.  Discharge time spent: greater than 30 minutes.  Signed: Delon Herald, MD [facility] 10/16/2023

## 2023-10-16 NOTE — Plan of Care (Signed)
   Problem: Health Behavior/Discharge Planning: Goal: Ability to manage health-related needs will improve Outcome: Progressing   Problem: Education: Goal: Knowledge of General Education information will improve Description: Including pain rating scale, medication(s)/side effects and non-pharmacologic comfort measures Outcome: Progressing

## 2023-10-16 NOTE — Progress Notes (Signed)
 OT Cancellation Note  Patient Details Name: [name] MRN: [id number] DOB: [birth year]   Cancelled Treatment:    Reason Eval/Treat Not Completed: Pain limiting ability to participate. Pt declined to participate in the therapy session, due to abdominal pain.    Delanna LITTIE Molt, OTR/L 10/16/2023, 1:45 PM

## 2023-10-16 NOTE — Progress Notes (Incomplete)
 This Clinical research associate spoke with pt and sister joyce at bedside. Sister states it is unsafe to discharge pt when she still cannot eat

## 2023-10-16 NOTE — Progress Notes (Signed)
 Discharge instructions were reviewed with the patent. Patient request prescription for gabapentin  from hospitalist provider. Provider updated and pt is to follow with pain clinic provider. She denied additional questions or concerns at this time. Midline removed, site is clean dry and intact.

## 2023-11-06 DIAGNOSIS — K3184 Gastroparesis: Secondary | ICD-10-CM | POA: Insufficient documentation

## 2023-11-16 DIAGNOSIS — E46 Unspecified protein-calorie malnutrition: Secondary | ICD-10-CM | POA: Insufficient documentation

## 2023-11-17 DIAGNOSIS — G43109 Migraine with aura, not intractable, without status migrainosus: Secondary | ICD-10-CM | POA: Insufficient documentation

## 2024-01-21 ENCOUNTER — Encounter (INDEPENDENT_AMBULATORY_CARE_PROVIDER_SITE_OTHER): Payer: Self-pay | Admitting: Gastroenterology

## 2024-01-23 ENCOUNTER — Encounter (INDEPENDENT_AMBULATORY_CARE_PROVIDER_SITE_OTHER): Payer: Self-pay | Admitting: Gastroenterology

## 2024-03-04 ENCOUNTER — Encounter: Payer: MEDICAID | Admitting: Obstetrics

## 2024-04-10 ENCOUNTER — Other Ambulatory Visit (INDEPENDENT_AMBULATORY_CARE_PROVIDER_SITE_OTHER): Payer: Self-pay | Admitting: Gastroenterology

## 2024-05-01 ENCOUNTER — Encounter (INDEPENDENT_AMBULATORY_CARE_PROVIDER_SITE_OTHER): Payer: Self-pay | Admitting: *Deleted

## 2024-05-08 ENCOUNTER — Emergency Department (HOSPITAL_COMMUNITY): Payer: MEDICAID

## 2024-05-08 ENCOUNTER — Other Ambulatory Visit: Payer: Self-pay

## 2024-05-08 ENCOUNTER — Emergency Department (HOSPITAL_COMMUNITY)
Admission: EM | Admit: 2024-05-08 | Discharge: 2024-05-08 | Disposition: A | Discharge status: Home / Self Care | Payer: MEDICAID | Attending: Emergency Medicine | Admitting: Emergency Medicine

## 2024-05-08 ENCOUNTER — Encounter (HOSPITAL_COMMUNITY): Payer: Self-pay

## 2024-05-08 DIAGNOSIS — W000XXA Fall on same level due to ice and snow, initial encounter: Secondary | ICD-10-CM | POA: Diagnosis not present

## 2024-05-08 DIAGNOSIS — S52532A Colles' fracture of left radius, initial encounter for closed fracture: Secondary | ICD-10-CM | POA: Diagnosis not present

## 2024-05-08 DIAGNOSIS — J45909 Unspecified asthma, uncomplicated: Secondary | ICD-10-CM | POA: Insufficient documentation

## 2024-05-08 DIAGNOSIS — S6992XA Unspecified injury of left wrist, hand and finger(s), initial encounter: Secondary | ICD-10-CM | POA: Diagnosis present

## 2024-05-08 MED ORDER — HYDROCODONE-ACETAMINOPHEN 5-325 MG PO TABS
1.0000 | ORAL_TABLET | Freq: Once | ORAL | Status: AC
  Administered 2024-05-08: 1 via ORAL
  Filled 2024-05-08: qty 1

## 2024-05-08 MED ORDER — HYDROMORPHONE HCL 1 MG/ML IJ SOLN
0.5000 mg | Freq: Once | INTRAMUSCULAR | Status: AC
  Administered 2024-05-08: 0.5 mg via INTRAVENOUS
  Filled 2024-05-08: qty 0.5

## 2024-05-08 MED ORDER — HYDROMORPHONE HCL 1 MG/ML IJ SOLN: 0.5000 mg | Freq: Once | INTRAMUSCULAR | Status: DC

## 2024-05-08 MED ORDER — KETAMINE HCL 50 MG/5ML IJ SOSY
0.3000 mg/kg | PREFILLED_SYRINGE | Freq: Once | INTRAMUSCULAR | Status: AC
  Administered 2024-05-08: 13 mg via INTRAVENOUS
  Filled 2024-05-08: qty 5

## 2024-05-08 MED ORDER — ACETAMINOPHEN 325 MG PO TABS
650.0000 mg | ORAL_TABLET | Freq: Once | ORAL | Status: DC
  Filled 2024-05-08: qty 2

## 2024-05-08 MED ORDER — LIDOCAINE HCL (PF) 1 % IJ SOLN
30.0000 mL | Freq: Once | INTRAMUSCULAR | Status: AC
  Administered 2024-05-08: 30 mL
  Filled 2024-05-08: qty 30

## 2024-05-08 NOTE — Discharge Instructions (Addendum)
 You were seen today for left wrist fracture, we reduced the fracture here in the ER and placed a splint, you leave the splint in place, come back if you have color change to your fingers, develop numbness or tingling or severe pain not controlled by your home pain medications.  Keep your arm elevated is much as possible to help decrease swelling.  Follow-up closely with orthopedics.

## 2024-05-08 NOTE — ED Triage Notes (Signed)
 Pt BIB [facility] for fall. Pt slipped on ice and fell onto her L wrist. Visible deformity. Pt crying in triage.

## 2024-05-08 NOTE — ED Provider Notes (Signed)
 " [place] EMERGENCY DEPARTMENT AT [facility] Provider Note   CSN: [id number] Arrival date & time: 05/08/24  1458     Patient presents with: No chief complaint on file.   [name] is a 49 y.o. female.  She has history of fibromyalgia, bipolar disorder, asthma.  Presents to ER for left wrist deformity.  States she slipped on ice and fell on outstretched left hand, having pain prior to the left wrist with deformity but states she has pain in the entire forearm.  Denies numbness or tingling, denies head injury or loss consciousness, she is not on blood thinners.   HPI     Prior to Admission medications  Medication Sig Start Date End Date Taking? Authorizing Provider  budesonide -formoterol  (SYMBICORT ) 80-4.5 MCG/ACT inhaler Inhale 2 puffs into the lungs 2 (two) times daily. Patient taking differently: Inhale 2 puffs into the lungs in the morning. 10/20/22 10/14/23  Saguier, [place], PA-C  busPIRone  (BUSPAR ) 15 MG tablet Take 1 tablet (15 mg total) by mouth 3 (three) times daily. 09/13/22   Maczis, Michael M, PA-C  clonazePAM  (KLONOPIN ) 1 MG tablet Take 1 mg by mouth in the morning and at bedtime.    [provider]  dicyclomine  (BENTYL ) 10 MG capsule Take 10 mg by mouth 2 (two) times daily as needed for spasms.    [provider]  DOTTI  0.0375 MG/24HR Place 1 patch onto the skin See admin instructions. Place 1 new patch onto the skin on Mondays and Thursdays    [provider]  EXCEDRIN MIGRAINE 250-250-65 MG tablet Take 1 tablet by mouth every 6 (six) hours as needed for headache or migraine.    [provider]  fluticasone  (FLONASE ) 50 MCG/ACT nasal spray Place 2 sprays into both nostrils daily. Patient taking differently: Place 2 sprays into both nostrils daily as needed for allergies or rhinitis. 04/22/19   Antonio Cyndee Jamee JONELLE, DO  gabapentin  (NEURONTIN ) 800 MG tablet Take 0.5 tablets (400 mg total) by mouth in the morning, at noon, in the  evening, and at bedtime. Patient taking differently: Take 800 mg by mouth in the morning, at noon, in the evening, and at bedtime. 02/01/21   Raenelle Coria, MD  hydrOXYzine  (ATARAX ) 25 MG tablet Take 25 mg by mouth 3 (three) times daily.    [provider]  lipase/protease/amylase (CREON ) 36000 UNITS CPEP capsule Take 1 capsule (36,000 Units total) by mouth 4 (four) times daily -  with meals and at bedtime. 04/26/21   Carlan, Chelsea L, NP  [Paused] metoprolol  tartrate (LOPRESSOR ) 25 MG tablet Take 0.5 tablets (12.5 mg total) by mouth 2 (two) times daily. Patient taking differently: Take 25 mg by mouth 2 (two) times daily. Wait to take this until your doctor or other care provider tells you to start again. 09/13/22   Maczis, Michael M, PA-C  mirtazapine  (REMERON ) 15 MG tablet Take 15 mg by mouth at bedtime.    [provider]  Naloxone  HCl 4 MG/0.25ML LIQD Place 1 spray into the nose See admin instructions. ADMINISTER 1 SPRAY EVERY 2 MINUTES AS NEEDED FOR OPIOID OVERDOSE; SPRAY 1 DOSE INTO ONE NOSTRIL; ALTERNATE NOSTRILS WITH EACH DOSE UNTIL HELP ARRIVES    [provider]  nicotine  (NICODERM CQ  - DOSED IN MG/24 HOURS) 14 mg/24hr patch Place 1 patch (14 mg total) onto the skin daily. 10/17/23   Barbarann Nest, MD  omeprazole  (PRILOSEC) 40 MG capsule Take 1 capsule (40 mg total) by mouth 2 (two)  times daily. Patient taking differently: Take 40 mg by mouth daily before breakfast. 02/12/23   Carlan, Chelsea L, NP  ondansetron  (ZOFRAN ) 4 MG tablet TAKE 1 TABLET BY MOUTH EVERY 8 HOURS AS NEEDED FOR NAUSEA OR VOMITING 04/11/24   Carlan, Chelsea L, NP  Oxycodone  HCl 10 MG TABS Take 1-1.5 tablets (10-15 mg total) by mouth every 6 (six) hours as needed for breakthrough pain. 09/13/22   Maczis, Michael M, PA-C  prazosin  (MINIPRESS ) 1 MG capsule Take 1 mg by mouth at bedtime.    [provider]  promethazine  (PHENERGAN ) 25 MG tablet Take 0.5 tablets (12.5 mg total) by mouth every 6  (six) hours as needed for nausea or vomiting. 07/26/23   Zelaya, Oscar A, PA-C  risperiDONE  (RISPERDAL ) 2 MG tablet Take 2 mg by mouth in the morning, at noon, and at bedtime.    [provider]  SUMAtriptan  (IMITREX ) 50 MG tablet Take 50 mg by mouth once as needed for migraine or headache (May repeat ONCE in 2 hours if headache persists or recurs).    [provider]  SYSTANE COMPLETE PF 0.6 % SOLN Place 1 drop into both eyes 3 (three) times daily as needed (for irritation).    [provider]  tamsulosin  (FLOMAX ) 0.4 MG CAPS capsule Take 0.4 mg by mouth daily after supper.    [provider]  trazodone  (DESYREL ) 300 MG tablet Take 150 mg by mouth at bedtime.    [provider]  TRINTELLIX  5 MG TABS tablet Take 5 mg by mouth daily.    [provider]  VENTOLIN  HFA 108 (90 Base) MCG/ACT inhaler Inhale 2 puffs into the lungs every 6 (six) hours as needed for wheezing or shortness of breath.    [provider]  Vitamin D , Ergocalciferol , (DRISDOL) 1.25 MG (50000 UNIT) CAPS capsule Take 50,000 Units by mouth every Monday.    [provider]    Allergies: Abilify  [aripiprazole ], Amitriptyline, Metoclopramide hcl, Propoxyphene, Toradol  [ketorolac  tromethamine ], Tramadol , Eszopiclone, Fentanyl , Nicoderm [nicotine ], Seroquel  [quetiapine ], Ubrogepant, Varenicline , Buprenorphine hcl, Demerol [meperidine], Emetrol, and Morphine and codeine     Review of Systems  Updated Vital Signs There were no vitals taken for this visit.  Physical Exam  (all labs ordered are listed, but only abnormal results are displayed) Labs Reviewed - No data to display  EKG: None  Radiology: No results found.   .Reduction of fracture  Date/Time: 05/08/2024 7:37 PM  Performed by: Suellen Sherran LABOR, PA-C Authorized by: Suellen Sherran LABOR, PA-C  Consent: Verbal consent obtained Consent given by: patient Patient understanding: patient states  understanding of the procedure being performed Patient consent: the patient's understanding of the procedure matches consent given Patient identity confirmed: verbally with patient Local anesthesia used: yes Anesthesia: hematoma block  Anesthesia: Local anesthesia used: yes Local Anesthetic: lidocaine  1% without epinephrine  Sedation: Patient sedated: no  Patient tolerance: patient tolerated the procedure well with no immediate complications Comments: Patient put in finger traps with 10 pounds of weight, allowed to hang for 15 minutes, placed in sugar-tong splint.  Distal pulses intact, sensation intact, capillary refill brisk after placement of her sugar-tong splint which is applied and adjusted by me, with assistance of RN.      Medications Ordered in the ED - No data to display                                  Medical  Decision Making Differential diagnosis includes but not limited to fracture, dislocation, sprain, strain, other  Course: Patient had a fall on outstretched hand, has a Colles' fracture of the left wrist.  Reduced as above using finger traps, patient required multiple doses of pain medicine as she is on oxycodone  10 mg 3 times daily, gabapentin  and clonazepam , pain is much improved after reduction and splinting, advised on elevation, continue home pain meds as I do not feel safe to give her additional pain meds and follow-up with orthopedics.  Consulted with Dr. Margrette, on-call for hand while patient was in the ED, he viewed patient's imaging, will follow-up with her in the office.  Patient has good perfusion, reports some diffuse tingling in the fingertips but it is symmetric in all fingertips, no focal neurologic deficit.  Amount and/or Complexity of Data Reviewed Radiology: ordered.  Risk OTC drugs. Prescription drug management.        Final diagnoses:  None    ED Discharge Orders     None          Suellen Sherran LABOR, PA-C 05/08/24  1945    Gennaro Duwaine CROME, DO 05/10/24 1448  "

## 2024-05-14 ENCOUNTER — Encounter: Payer: Self-pay | Admitting: Orthopedic Surgery

## 2024-05-14 ENCOUNTER — Ambulatory Visit: Payer: MEDICAID | Admitting: Orthopedic Surgery

## 2024-05-14 VITALS — BP 107/72 | Ht 64.0 in | Wt 98.0 lb

## 2024-05-14 DIAGNOSIS — S52532A Colles' fracture of left radius, initial encounter for closed fracture: Secondary | ICD-10-CM

## 2024-05-14 NOTE — Addendum Note (Signed)
 Addended byBETHA JENEAN GREIG LELON on: 05/14/2024 10:54 AM   Modules accepted: Orders

## 2024-05-14 NOTE — Patient Instructions (Signed)
 Your surgery will be at [facility] by Dr Margrette  The hospital will contact you with a preoperative appointment to discuss Anesthesia.  Please arrive on time or 15 minutes early for the preoperative appointment, they have a very tight schedule if you are late or do not come in your surgery will be cancelled.  The phone number is [phone]. Please bring your medications with you for the appointment. They will tell you the arrival time and medication instructions when you have your preoperative evaluation. Do not wear nail polish the day of your surgery and if you take Phentermine you need to stop this medication ONE WEEK prior to your surgery. If you take Invokana, Farxiga, Jardiance, or Steglatro) - Hold 72 hours before the procedure.  If you take Ozempic,  Mounjaro, Bydureon or Trulicity do not take for 8 days before your surgery. If you take Victoza, Rybelsis, Saxenda or Adlyxi stop 24 hours before the procedure.  Please arrive at the hospital 2 hours before procedure if scheduled at 9:30 or later in the day or at the time the nurse tells you at your preoperative visit.   If you have my chart do not use the time given in my chart use the time given to you by the nurse during your preoperative visit.   Your surgery  time may change. Please be available for phone calls the day of your surgery and the day before. The Short Stay department may need to discuss changes about your surgery time. Not reaching the you could lead to procedure delays and possible cancellation.  You must have a ride home and someone to stay with you for 24 to 48 hours. The person taking you home will receive and sign for the your discharge instructions.  Please be prepared to give your support person's name and telephone number to Central Registration. Dr Margrette will need that name and phone number post procedure.

## 2024-05-14 NOTE — Progress Notes (Signed)
" °  Intake history:  Chief Complaint  Patient presents with   Wrist Injury    05/08/24 Left wrist fracture reduced in ER      BP 107/72 Comment: 05/08/24  Ht 5' 4 (1.626 m)   Wt 98 lb (44.5 kg)   BMI 16.82 kg/m  Body mass index is 16.82 kg/m.  Pharmacy? _______WM 14_______________________________  WHAT ARE WE SEEING YOU FOR TODAY?   Left wrist   How long has this bothered you? (DOI?DOS?WS?)  on 05/08/24  Was there an injury? Yes  Anticoag.  No   Any ALLERGIES _____Allergies[1] _________________________________________   Treatment:  Have you taken:  Tylenol  Yes  Advil  No  Had PT No  Had injection No  Other  ___________sugar tong splint/ meds from pain management______________        [1]  Allergies Allergen Reactions   Abilify  [Aripiprazole ] Swelling, Palpitations and Other (See Comments)    Throat swelling, tremors    Amitriptyline Anaphylaxis, Swelling and Other (See Comments)    Angioedema   Metoclopramide Hcl Other (See Comments)    Causes seizures   Propoxyphene Hives, Itching, Dermatitis and Rash   Toradol  [Ketorolac  Tromethamine ]    Tramadol  Swelling, Other (See Comments) and Rash    Throat swelling, tremors   Eszopiclone Other (See Comments)    Hallucinations, hyperactivity, and bad taste in mouth    Fentanyl  Itching    Itching, tremors, tachycardia.   Nicoderm [Nicotine ] Other (See Comments)    Can tolerate only certain ones- itching and dizziness   Seroquel  [Quetiapine ] Other (See Comments)    Felt too lethargic the day after taking @ 400 mg   Ubrogepant Other (See Comments)    [place]- for migraines- My legs would not work   Varenicline  Other (See Comments)    Suicidal thoughts   Buprenorphine Hcl Itching and Hives   Demerol [Meperidine] Rash   Emetrol Hives, Itching, Dermatitis and Rash   Morphine And Codeine  Hives and Itching    Patient states she is able to tolerate Dilaudid .   "

## 2024-05-14 NOTE — Progress Notes (Signed)
 "  Office Visit Note   Patient: [name]           Date of Birth: December 08, 1975           MRN: [id number] Visit Date: 05/14/2024 Requested by: Orpha Yancey LABOR, MD [address] [place],  [place] [address] PCP: Orpha Yancey LABOR, MD   Assessment & Plan:   Encounter Diagnosis  Name Primary?   Closed Colles' fracture of left radius, initial encounter Yes    No orders of the defined types were placed in this encounter.  49 year old female with history of fibromyalgia, smoking, chronic opioid therapy for chronic pain displaced left distal radius fracture requires operative repair  Plan for ORIF left wrist via dorsal approach with Stryker dorsal wrist plate  Patient-specific risk factors described: Fibromyalgia smoking and chronic opioid therapy and chronic pain syndrome  The procedure has been fully reviewed with the patient; The risks and benefits of surgery have been discussed and explained and understood. Alternative treatment has also been reviewed, questions were encouraged and answered. The postoperative plan is also been reviewed.  Splint rewrapped loosely with Ace bandage  Subjective: Chief Complaint  Patient presents with   Wrist Injury    05/08/24 Left wrist fracture reduced in ER     HPI: 49 year old female history of requiring chronic opioid therapy, fibromyalgia, smoking history still positive.  Slip and fall injured left wrist on May 08, 2024.  Presented to the ER with displaced distal radius fracture attempted closed reduction partially reduced but incomplete reduction noted  Patient presents complaining of pain than her chronic pain medication oxycodone  is not handling              ROS: No numbness or tingling at this time   Images personally read and my interpretation : Outside image #1 displaced distal radius fracture mild comminution  Outside image #2 status post partial reduction with improved alignment but residual deformity  Visit Diagnoses:  1. Closed  Colles' fracture of left radius, initial encounter      Follow-Up Instructions: No follow-ups on file.    Objective: Vital Signs: BP 107/72 Comment: 05/08/24  Ht 5' 4 (1.626 m)   Wt 98 lb (44.5 kg)   BMI 16.82 kg/m   Physical Exam General Appearance patient has a small thin frame underweight probably.  She is oriented x 3  Mood is pleasant affect nervous probably related to the pain she is having  Gait and station remain normal  We removed part of the wrap and opened up some of the under wrap.  She did get some partial relief from that.  Her fingers are very stiff already.  Pulse perfusion capillary refill normal fingers are not swollen sensation is intact      Specialty Comments:  No specialty comments available.  Imaging: No results found.   PMFS History: Patient Active Problem List   Diagnosis Date Noted   Migraine with aura and without status migrainosus, not intractable 11/17/2023   Chronic malnutrition 11/16/2023   Gastroparesis 11/06/2023   Continuous dependence on cigarette smoking 10/15/2023   Intractable nausea and vomiting 10/14/2023   History of dysphagia 10/14/2023   Chronic sinus bradycardia 10/14/2023   Hypotension 10/14/2023   Anorexia nervosa (HCC) 10/14/2023   PEG (percutaneous endoscopic gastrostomy) status (HCC) 03/06/2023   Pneumothorax on left 09/01/2022   Pancreatic insufficiency 04/26/2021   Dysphagia    UTI (urinary tract infection) 01/21/2021   Thrombocytosis 01/21/2021   Elevated lipase 01/21/2021  Hypokalemia 01/21/2021   Fibromyalgia 01/21/2021   History of IBS 01/21/2021   Nausea 05/24/2020   Abdominal cramping 05/24/2020   Severe protein-calorie malnutrition 05/24/2020   Encounter by telehealth for suspected COVID-19 03/20/2019   Abdominal pain, epigastric 10/07/2018   Intractable vomiting 08/09/2018   Compression fracture of fifth lumbar vertebra (HCC) 02/07/2018   Altered mental status 02/07/2018   Generalized  weakness 01/24/2018   MDD (major depressive disorder) 04/23/2017   Substance abuse (HCC) 04/19/2017   Hyperthyroidism 02/17/2015   Anxiety 01/28/2015   Depression 01/28/2015   COPD with acute exacerbation (HCC) 11/26/2013   Vaginitis and vulvovaginitis 11/26/2013   Recurrent pneumonia 02/02/2013   Left arm pain 01/13/2013   Left arm pain 01/13/2013    Class: Acute   Dizziness and giddiness 01/13/2013   Acute bronchitis 12/07/2012   Rib pain 12/07/2012   Insomnia 08/26/2012   Diarrhea 06/19/2012   Heme positive stool 06/19/2012   Bloating 06/19/2012   Nausea alone 06/19/2012   Bladder retention 04/24/2012   Tremor 03/05/2012   Dysuria 03/05/2012   Failure to thrive  in adult 06/21/2011   Airway hyperreactivity 03/08/2011   Back pain, chronic 03/08/2011   COPD (chronic obstructive pulmonary disease) (HCC) 03/08/2011   GERD (gastroesophageal reflux disease) 03/08/2011   Cystitis 01/22/2011   Bilateral hand numbness 01/13/2011   WEIGHT LOSS 06/24/2010   RIB PAIN, LEFT SIDED 05/04/2010   ABDOMINAL PAIN, LEFT UPPER QUADRANT 05/04/2010   Otitis media 10/26/2009   BRONCHITIS, RECURRENT 08/23/2009   Acute upper respiratory infection 08/04/2009   FATIGUE 07/06/2009   Irritable bowel syndrome with both constipation and diarrhea 03/11/2009   ANAL FISSURE 03/11/2009   RECTAL BLEEDING 03/11/2009   Chronic interstitial cystitis 03/11/2009   History of colonic polyps 03/11/2009   GERD 02/05/2009   SMOKER 12/04/2008   chronic asthma poorly controlled 12/04/2008   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   Sedative, 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 Darlean.....-HFA 75% 12/04/2008>75% 02/05/2009>75% 08/04/2009 -PFT's 01/04/2009 2.56 (86%) ratio 75, no resp to B2 and DLC0 67% > 80 after correction    Cancer (HCC)    cervical cancer   Chronic interstitial cystitis 03/11/2009   Chronic nausea    Chronic pain    COLONIC POLYPS, HX OF 07/25/2006   ADENOMATOUS POLYP   COPD (chronic obstructive pulmonary disease) (HCC)    Endometriosis    FIBROMYALGIA 08/31/2007   GERD 02/05/2009   History of kidney stones    Hyperlipidemia    HYPERTENSION 08/31/2007   IBS 03/11/2009   Internal hemorrhoids    Migraine headache    Pancreatitis    PONV (postoperative nausea and vomiting)    RECTAL BLEEDING 03/11/2009   Sciatica    right leg   Seizures (HCC)    been about 1 year since last seisure per pt   Substance abuse (HCC)    Thyroid  disease    Uterine cyst  Family History  Problem Relation Age of Onset   Heart disease Father    Asthma Maternal Grandmother    Emphysema Maternal Grandfather    Cancer Maternal Grandfather        Lung Cancer   Cancer Other        Lung Cancer-Aunt   Colon cancer Neg Hx    Esophageal cancer Neg Hx    Rectal cancer Neg Hx    Stomach cancer Neg Hx    Thyroid  disease Neg Hx     Past Surgical History:  Procedure Laterality Date   ABDOMINAL HYSTERECTOMY     BIOPSY  10/25/2018   Procedure: BIOPSY;  Surgeon: Golda Claudis PENNER, MD;  Location: [facility];  Service: Endoscopy;;  gastric   BIOPSY  06/11/2020   Procedure: BIOPSY;  Surgeon: Eartha Angelia Sieving, MD;  Location: [facility];  Service: Gastroenterology;;  small bowel   BIOPSY  10/26/2022   Procedure: BIOPSY;  Surgeon: Cinderella Deatrice FALCON, MD;  Location: [facility];  Service: Endoscopy;;   bladder  stretching x6     BLADDER SURGERY     stimulator placed and stretching    CHOLECYSTECTOMY     COLONOSCOPY     COLONOSCOPY WITH PROPOFOL  N/A 09/03/2014   Procedure: COLONOSCOPY WITH PROPOFOL ;  Surgeon: Sieving SHAUNNA Cedar, MD;  Location: [facility];  Service: Endoscopy;  Laterality: N/A;   COLONOSCOPY WITH PROPOFOL  N/A 05/20/2021   Procedure: COLONOSCOPY WITH PROPOFOL ;  Surgeon: Eartha Angelia Sieving, MD;  Location: [facility];  Service: Gastroenterology;  Laterality: N/A;  10:10 / Asa 2 pt states she has had a hysterectomy   ESOPHAGOGASTRODUODENOSCOPY (EGD) WITH PROPOFOL  N/A 10/25/2018   Procedure: ESOPHAGOGASTRODUODENOSCOPY (EGD) WITH PROPOFOL ;  Surgeon: Golda Claudis PENNER, MD;  Location: [facility];  Service: Endoscopy;  Laterality: N/A;  11:15   ESOPHAGOGASTRODUODENOSCOPY (EGD) WITH PROPOFOL  N/A 06/11/2020   Procedure: ESOPHAGOGASTRODUODENOSCOPY (EGD) WITH PROPOFOL ;  Surgeon: Eartha Angelia Sieving, MD;  Location: [facility];  Service: Gastroenterology;  Laterality: N/A;   ESOPHAGOGASTRODUODENOSCOPY (EGD) WITH PROPOFOL  N/A 10/26/2022   Procedure: ESOPHAGOGASTRODUODENOSCOPY (EGD) WITH PROPOFOL ;  Surgeon: Cinderella Deatrice FALCON, MD;  Location: [facility];  Service: Endoscopy;  Laterality: N/A;  9:00am;asa 3   interstitial cystitis     IR GASTROSTOMY TUBE MOD SED  01/27/2021   PACEMAKER INSERTION     in hip for interstitial cystitis   pacemaker removal     PEG TUBE PLACEMENT     POLYPECTOMY  05/20/2021   Procedure: POLYPECTOMY;  Surgeon: Eartha Angelia, Sieving, MD;  Location: [facility];  Service: Gastroenterology;;   removal of uterine cyst and scrapped uterus     replaced bladder pacemaker     Social History   Occupational History    Employer: UNEMPLOYED  Tobacco Use   Smoking status: Every Day    Current packs/day: 1.00    Average packs/day: 1 pack/day for 14.0 years (14.0 ttl pk-yrs)    Types: Cigarettes   Smokeless tobacco: Never  Vaping Use   Vaping status:  Former  Substance and Sexual Activity   Alcohol  use: Not Currently    Comment: last drink 3 weeks ago; recently released from rehab   Drug use: Yes    Types: Marijuana    Comment: once a month   Sexual activity: Not on file       "

## 2024-05-16 NOTE — H&P (Signed)
 History and physical for outpatient surgery  Diagnosis left distal radius fracture  Planned procedure open treatment internal fixation  Surgeon Margrette  Plan discharge home after surgery               Patient: [name]                                  Date of Birth: 08-11-1975                                                   MRN: [id number] Visit Date: 05/14/2024 Requested by: Orpha Yancey LABOR, MD [address] [place],  [place] [address] PCP: Orpha Yancey LABOR, MD     Assessment & Plan:        Encounter Diagnosis  Name Primary?   Closed Colles' fracture of left radius, initial encounter Yes      No orders of the defined types were placed in this encounter.   49 year old female with history of fibromyalgia, smoking, chronic opioid therapy for chronic pain displaced left distal radius fracture requires operative repair   Plan for ORIF left wrist via dorsal approach with Stryker dorsal wrist plate   Patient-specific risk factors described: Fibromyalgia smoking and chronic opioid therapy and chronic pain syndrome   The procedure has been fully reviewed with the patient; The risks and benefits of surgery have been discussed and explained and understood. Alternative treatment has also been reviewed, questions were encouraged and answered. The postoperative plan is also been reviewed.   Splint rewrapped loosely with Ace bandage   Subjective:     Chief Complaint  Patient presents with   Wrist Injury      05/08/24 Left wrist fracture reduced in ER       HPI: 49 year old female history of requiring chronic opioid therapy, fibromyalgia, smoking history still positive.  Slip and fall injured left wrist on May 08, 2024.  Presented to the ER with displaced distal radius fracture attempted closed reduction partially reduced but incomplete reduction noted   Patient presents complaining of pain than her chronic pain medication oxycodone  is not handling                                                                      ROS: No numbness or tingling at this time     Images personally read and my interpretation : Outside image #1 displaced distal radius fracture mild comminution   Outside image #2 status post partial reduction with improved alignment but residual deformity   Visit Diagnoses:  1. Closed Colles' fracture of left radius, initial encounter         Follow-Up Instructions: No follow-ups on file.      Objective: Vital Signs: BP 107/72 Comment: 05/08/24  Ht 5' 4 (1.626 m)   Wt 98 lb (44.5 kg)   BMI 16.82 kg/m    Physical Exam General Appearance patient has a small thin frame underweight probably.   She is oriented x 3   Mood is pleasant affect  nervous probably related to the pain she is having   Gait and station remain normal   We removed part of the wrap and opened up some of the under wrap.  She did get some partial relief from that.  Her fingers are very stiff already.  Pulse perfusion capillary refill normal fingers are not swollen sensation is intact           Specialty Comments:  No specialty comments available.   Imaging: No results found.     PMFS History:      Patient Active Problem List    Diagnosis Date Noted   Migraine with aura and without status migrainosus, not intractable 11/17/2023   Chronic malnutrition 11/16/2023   Gastroparesis 11/06/2023   Continuous dependence on cigarette smoking 10/15/2023   Intractable nausea and vomiting 10/14/2023   History of dysphagia 10/14/2023   Chronic sinus bradycardia 10/14/2023   Hypotension 10/14/2023   Anorexia nervosa (HCC) 10/14/2023   PEG (percutaneous endoscopic gastrostomy) status (HCC) 03/06/2023   Pneumothorax on left 09/01/2022   Pancreatic insufficiency 04/26/2021   Dysphagia     UTI (urinary tract infection) 01/21/2021   Thrombocytosis 01/21/2021   Elevated lipase 01/21/2021   Hypokalemia 01/21/2021   Fibromyalgia 01/21/2021   History of IBS 01/21/2021    Nausea 05/24/2020   Abdominal cramping 05/24/2020   Severe protein-calorie malnutrition 05/24/2020   Encounter by telehealth for suspected COVID-19 03/20/2019   Abdominal pain, epigastric 10/07/2018   Intractable vomiting 08/09/2018   Compression fracture of fifth lumbar vertebra (HCC) 02/07/2018   Altered mental status 02/07/2018   Generalized weakness 01/24/2018   MDD (major depressive disorder) 04/23/2017   Substance abuse (HCC) 04/19/2017   Hyperthyroidism 02/17/2015   Anxiety 01/28/2015   Depression 01/28/2015   COPD with acute exacerbation (HCC) 11/26/2013   Vaginitis and vulvovaginitis 11/26/2013   Recurrent pneumonia 02/02/2013   Left arm pain 01/13/2013   Left arm pain 01/13/2013      Class: Acute   Dizziness and giddiness 01/13/2013   Acute bronchitis 12/07/2012   Rib pain 12/07/2012   Insomnia 08/26/2012   Diarrhea 06/19/2012   Heme positive stool 06/19/2012   Bloating 06/19/2012   Nausea alone 06/19/2012   Bladder retention 04/24/2012   Tremor 03/05/2012   Dysuria 03/05/2012   Failure to thrive  in adult 06/21/2011   Airway hyperreactivity 03/08/2011   Back pain, chronic 03/08/2011   COPD (chronic obstructive pulmonary disease) (HCC) 03/08/2011   GERD (gastroesophageal reflux disease) 03/08/2011   Cystitis 01/22/2011   Bilateral hand numbness 01/13/2011   WEIGHT LOSS 06/24/2010   RIB PAIN, LEFT SIDED 05/04/2010   ABDOMINAL PAIN, LEFT UPPER QUADRANT 05/04/2010   Otitis media 10/26/2009   BRONCHITIS, RECURRENT 08/23/2009   Acute upper respiratory infection 08/04/2009   FATIGUE 07/06/2009   Irritable bowel syndrome with both constipation and diarrhea 03/11/2009   ANAL FISSURE 03/11/2009   RECTAL BLEEDING 03/11/2009   Chronic interstitial cystitis 03/11/2009   History of colonic polyps 03/11/2009   GERD 02/05/2009   SMOKER 12/04/2008   chronic asthma poorly controlled 12/04/2008   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   Sedative, 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 Darlean.....-HFA 75% 12/04/2008>75% 02/05/2009>75% 08/04/2009 -PFT's 01/04/2009 2.56 (86%) ratio 75, no resp to B2 and DLC0 67% > 80 after correction    Cancer (HCC)      cervical cancer   Chronic interstitial cystitis 03/11/2009   Chronic nausea     Chronic pain     COLONIC POLYPS, HX OF 07/25/2006    ADENOMATOUS POLYP   COPD (chronic obstructive pulmonary disease) (HCC)     Endometriosis     FIBROMYALGIA 08/31/2007   GERD 02/05/2009   History of kidney stones     Hyperlipidemia     HYPERTENSION 08/31/2007   IBS 03/11/2009   Internal hemorrhoids     Migraine headache     Pancreatitis     PONV (postoperative nausea and vomiting)     RECTAL BLEEDING 03/11/2009   Sciatica      right leg   Seizures (HCC)      been about 1 year since last seisure per pt   Substance abuse (HCC)     Thyroid  disease     Uterine cyst               Family History  Problem Relation Age of Onset   Heart disease Father     Asthma Maternal Grandmother     Emphysema Maternal Grandfather     Cancer Maternal Grandfather          Lung Cancer   Cancer Other          Lung Cancer-Aunt   Colon cancer Neg Hx     Esophageal cancer Neg Hx     Rectal cancer Neg Hx     Stomach cancer Neg Hx     Thyroid  disease Neg Hx               Past Surgical History:  Procedure Laterality Date   ABDOMINAL HYSTERECTOMY        BIOPSY   10/25/2018    Procedure: BIOPSY;  Surgeon: Golda Claudis PENNER, MD;  Location: [facility];  Service: Endoscopy;;  gastric   BIOPSY   06/11/2020    Procedure: BIOPSY;  Surgeon: Eartha Angelia Sieving, MD;  Location: [facility];  Service: Gastroenterology;;  small bowel   BIOPSY   10/26/2022    Procedure: BIOPSY;  Surgeon: Cinderella Deatrice FALCON, MD;  Location: [facility];  Service: Endoscopy;;   bladder stretching x6       BLADDER SURGERY        stimulator placed and stretching    CHOLECYSTECTOMY       COLONOSCOPY       COLONOSCOPY WITH PROPOFOL  N/A 09/03/2014    Procedure: COLONOSCOPY WITH PROPOFOL ;  Surgeon: Sieving SHAUNNA Cedar, MD;  Location: [facility];  Service: Endoscopy;  Laterality: N/A;   COLONOSCOPY WITH PROPOFOL  N/A 05/20/2021    Procedure: COLONOSCOPY WITH PROPOFOL ;  Surgeon: Eartha Angelia Sieving, MD;  Location: [facility];  Service: Gastroenterology;  Laterality: N/A;  10:10 / Asa 2 pt states she has had a hysterectomy   ESOPHAGOGASTRODUODENOSCOPY (EGD) WITH PROPOFOL  N/A 10/25/2018    Procedure: ESOPHAGOGASTRODUODENOSCOPY (EGD) WITH PROPOFOL ;  Surgeon: Golda Claudis PENNER, MD;  Location: [facility];  Service:  Endoscopy;  Laterality: N/A;  11:15   ESOPHAGOGASTRODUODENOSCOPY (EGD) WITH PROPOFOL  N/A 06/11/2020    Procedure: ESOPHAGOGASTRODUODENOSCOPY (EGD) WITH PROPOFOL ;  Surgeon: Eartha Angelia Sieving, MD;  Location: [facility];  Service: Gastroenterology;  Laterality: N/A;   ESOPHAGOGASTRODUODENOSCOPY (EGD) WITH PROPOFOL  N/A 10/26/2022    Procedure: ESOPHAGOGASTRODUODENOSCOPY (EGD) WITH PROPOFOL ;  Surgeon: Cinderella Deatrice FALCON, MD;  Location: [facility];  Service: Endoscopy;  Laterality: N/A;  9:00am;asa 3   interstitial cystitis       IR GASTROSTOMY TUBE MOD SED   01/27/2021   PACEMAKER INSERTION        in hip for interstitial cystitis   pacemaker removal       PEG TUBE PLACEMENT       POLYPECTOMY   05/20/2021    Procedure: POLYPECTOMY;  Surgeon:  Eartha Angelia, Sieving, MD;  Location: [facility];  Service: Gastroenterology;;   removal of uterine cyst and scrapped uterus       replaced bladder pacemaker            Social History         Occupational History      Employer: UNEMPLOYED  Tobacco Use   Smoking status: Every Day      Current packs/day: 1.00      Average packs/day: 1 pack/day for 14.0 years (14.0 ttl pk-yrs)      Types: Cigarettes   Smokeless tobacco: Never  Vaping Use   Vaping status: Former  Substance and Sexual Activity   Alcohol  use: Not Currently      Comment: last drink 3 weeks ago; recently released from rehab   Drug use: Yes      Types: Marijuana      Comment: once a month   Sexual activity: Not on file

## 2024-05-16 NOTE — Patient Instructions (Signed)
 "   KEYANNA SANDEFER  05/16/2024     @[email or web address] @   Your procedure is scheduled on 05/20/2024.   Report to [facility] at 9:00 A.M.   Call this number if you have problems the morning of surgery:  [phone]  If you experience any cold or flu symptoms such as cough, fever, chills, shortness of breath, etc. between now and your scheduled surgery, please notify us  at the above number.   Remember:   Do not eat or drink after midnight.     Take these medicines the morning of surgery with A SIP OF WATER  : Buspar  Klonopin  Atarax  Metoprolol  Prilosec Risperidone  Imitrex   And (Zofran  and Oxycodone  prn)  Please use your inhaler before coming to the hospital.    Do not wear jewelry, make-up or nail polish, including gel polish,  artificial nails, or any other type of covering on natural nails (fingers and  toes).  Do not wear lotions, powders, or perfumes, or deodorant.  Do not shave 48 hours prior to surgery.  Men may shave face and neck.  Do not bring valuables to the hospital.  [facility] is not responsible for any belongings or valuables.  Contacts, dentures or bridgework may not be worn into surgery.  Leave your suitcase in the car.  After surgery it may be brought to your room.  For patients admitted to the hospital, discharge time will be determined by your treatment team.  Patients discharged the day of surgery will not be allowed to drive home.   Name and phone number of your driver:   Family  Special instructions:  N/A  Please read over the following fact sheets that you were given.  Care and Recovery After Surgery  ORIF Surgery for a Broken Wrist: What to Expect Open reduction with internal fixation (ORIF) is a type of surgery that is used to repair a break in a bone and keep the bones stable. During ORIF for a broken wrist, the surgeon will move the bones of the broken wrist back into the correct position. The surgeon will put in surgical plates, screws, or other  types of hardware to hold the bones in place. In some cases, a rod or pins may be used. This depends on the location and type of broken bones. Tell a health care provider about: Any allergies you have. All medicines you take. These include vitamins, herbs, eye drops, and creams. Any problems you or family members have had with anesthesia. Any bleeding problems you have. Any surgeries you've had. Any medical conditions you have. Whether you're pregnant or may be pregnant. What are the risks? Your health care provider will talk with you about risks. These may include: Infection. Bleeding. Blood clots. Stiff wrist. Nerve damage. Tendon injury. Arthritis. This is long-term pain and stiffness. Hardware problems. You may also have another surgery if the bones don't heal correctly. What happens before the surgery? When to stop eating and drinking Eat and drink only as you've been told. You may be told this: 8 hours before the surgery Stop eating most foods. Do not eat meat, fried foods, or fatty foods. Eat only light foods, such as toast or crackers. All liquids are okay except energy drinks and alcohol . 6 hours before the surgery Stop eating. Drink only clear liquids, such as water , clear fruit juice, black coffee, plain tea, and sports drinks. Do not drink energy drinks and alcohol . 2 hours before the surgery Stop drinking all liquids. You may be allowed to take  medicines with small sips of water . If you do not eat and drink as told, your surgery may be delayed or canceled. Medicines Ask about changing or stopping: Any medicines you take. Any vitamins, herbs, or supplements you take. Do not take aspirin or ibuprofen  unless you're told to. Surgery safety For your safety, you may: Need to wash your skin with a soap that kills germs. Get antibiotics. Have your surgery site marked. Have hair removed at the surgery site. General instructions Ask if you'll be staying overnight in  the hospital. If you'll be going home right after the surgery, plan to have a responsible adult: Drive you home from the hospital or clinic. You won't be allowed to drive. Stay with you for the time you're told. What happens during the surgery?  An IV will be put into a vein in your hand or arm. You may be given: A sedative to help you relax. Anesthesia to keep you from feeling pain. A tourniquet may be put on your upper arm to decrease blood flow to the wrist. The surgeon makes a cut (incision) on the palm side of your wrist to get to the broken bones. The broken pieces of bone will be put back into their normal positions. To hold the bone pieces in place, the surgeon may put in a metal plate, screws, or other types of hardware. After the bones are back in place, the cut will be closed with stitches or staples. The surgeon will place a fluffy gauze to provide a soft padding around your wrist. A compression bandage will be wrapped around it after. In some cases, a cast may be placed instead of the bandage. These steps may vary. Ask what you can expect. What happens after the surgery? You will be watched closely until you leave. This includes checking your pain level, blood pressure, heart rate and breathing rate. You will be given medicine for pain. Your wrist will be raised up on pillows with ice packs to prevent swelling. You may be given a sling to support your arm. This information is not intended to replace advice given to you by your health care provider. Make sure you discuss any questions you have with your health care provider. Document Revised: 11/20/2022 Document Reviewed: 11/20/2022 Elsevier Patient Education  2024 [employer]  Medicines to Make You Sleep for Surgery (General Anesthesia) in Adults: What to Expect General anesthesia is the use of medicine to make you fall asleep (unconscious) for a medical procedure. General anesthesia must be used for certain procedures. It  is often recommended for surgery or procedures that: Last a long time. Require you to be still or in an unusual position. Are major and can cause blood loss. Affect your breathing. The medicines used for general anesthesia are called general anesthetics. During general anesthesia, these medicines are given along with medicines that: Prevent pain. Control your blood pressure. Relax your muscles. Prevent nausea and vomiting after the procedure. Tell a health care provider about: Any allergies you have. All medicines you are taking, including vitamins, herbs, eye drops, creams, and over-the-counter medicines. Your history of any: Medical conditions you have, including: High blood pressure. Bleeding problems. Diabetes. Heart or lung conditions, such as: Heart failure. Sleep apnea. Asthma. Chronic obstructive pulmonary disease (COPD). Current or recent illnesses, such as: Upper respiratory, chest, or ear infections. Cough or fever. Tobacco or drug use, including marijuana or alcohol  use. Depression or anxiety. Surgeries and types of anesthetics you have had. Problems you or family  members have had with anesthetic medicines. Whether you are pregnant or may be pregnant. Whether you have any chipped or loose teeth, dentures, caps, bridgework, or issues with your mouth, swallowing, or choking. What are the risks? Your health care provider will talk with you about risks. These may include: Allergic reaction to the medicines. Lung and heart problems. Inhaling food or liquid from the stomach into the lungs (aspiration). Nerve injury. Injury to the lips, mouth, teeth, or gums. Stroke. Waking up during your procedure and being unable to move. This is rare. These problems are more likely to develop if you are having a major surgery or if you have an advanced or serious medical condition. You can prevent some of these complications by answering all of your health care provider's questions  thoroughly and by following all instructions before your procedure. General anesthesia can cause side effects, including: Nausea or vomiting. A sore throat or hoarseness from the breathing tube. Wheezing or coughing. Shaking chills or feeling cold. Body aches. Sleepiness. Confusion, agitation (delirium), or anxiety. What happens before the procedure? When to stop eating and drinking Follow instructions from your health care provider about what you may eat and drink before your procedure. If you do not follow your health care provider's instructions, your procedure may be delayed or canceled. Medicines Ask your health care provider about: Changing or stopping your regular medicines. These include any diabetes medicines or blood thinners you take. Taking medicines such as aspirin and ibuprofen . These medicines can thin your blood. Do not take them unless your health care provider tells you to. Taking over-the-counter medicines, vitamins, herbs, and supplements. General instructions Do not use any products that contain nicotine  or tobacco for at least 4 weeks before the procedure. These products include cigarettes, chewing tobacco, and vaping devices, such as e-cigarettes. If you need help quitting, ask your health care provider. If you brush your teeth on the morning of the procedure, make sure to spit out all of the water  and toothpaste. If told by your health care provider, bring your sleep apnea device with you to surgery (if applicable). If you will be going home right after the procedure, plan to have a responsible adult: Take you home from the hospital or clinic. You will not be allowed to drive. Care for you for the time you are told. What happens during the procedure?  An IV will be inserted into one of your veins. You will be given one or more of the following through a face mask or IV: A sedative. This helps you relax. Anesthesia. This will: Numb certain areas of your  body. Make you fall asleep for surgery. After you are unconscious, a breathing tube may be inserted down your throat to help you breathe. This will be removed before you wake up. An anesthesia provider, such as an anesthesiologist, will stay with you throughout your procedure. The anesthesia provider will: Keep you comfortable and safe by continuing to give you medicines and adjusting the amount of medicine that you get. Monitor your blood pressure, heart rate, and oxygen  levels to make sure that the anesthetics do not cause any problems. The procedure may vary among health care providers and hospitals. What happens after the procedure? Your blood pressure, temperature, heart rate, breathing rate, and blood oxygen  level will be monitored until you leave the hospital or clinic. You will wake up in a recovery area. You may wake up slowly. You may be given medicine to help you with pain, nausea, or  any other side effects from the anesthesia. Summary General anesthesia is the use of medicine to make you fall asleep (unconscious) for a medical procedure. Follow your health care provider's instructions about when to stop eating, drinking, or taking certain medicines before your procedure. Plan to have a responsible adult take you home from the hospital or clinic. This information is not intended to replace advice given to you by your health care provider. Make sure you discuss any questions you have with your health care provider. Document Revised: 02/04/2024 Document Reviewed: 06/23/2021 Elsevier Patient Education  2025 [employer].  How to Use Chlorhexidine  at Home in the Shower Chlorhexidine  gluconate (CHG) is a germ-killing (antiseptic) wash that's used to clean the skin. It can get rid of the germs that normally live on the skin and can keep them away for about 24 hours. If you're having surgery, you may be told to shower with CHG at home the night before surgery. This can help lower your risk  for infection. To use CHG wash in the shower, follow the steps below. Supplies needed: CHG body wash. Clean washcloth. Clean towel. How to use CHG in the shower Follow these steps unless you're told to use CHG in a different way: Start the shower. Use your normal soap and shampoo to wash your face and hair. Turn off the shower or move out of the shower stream. Pour CHG onto a clean washcloth. Do not use any type of brush or rough sponge. Start at your neck, washing your body down to your toes. Make sure you: Wash the part of your body where the surgery will be done for at least 1 minute. Do not scrub. Do not use CHG on your head or face unless your health care provider tells you to. If it gets into your ears or eyes, rinse them well with water . Do not wash your genitals with CHG. Wash your back and under your arms. Make sure to wash skin folds. Let the CHG sit on your skin for 1-2 minutes or as long as told. Rinse your entire body in the shower, including all body creases and folds. Turn off the shower. Dry off with a clean towel. Do not put anything on your skin afterward, such as powder, lotion, or perfume. Put on clean clothes or pajamas. If it's the night before surgery, sleep in clean sheets. General tips Use CHG only as told, and follow the instructions on the label. Use the full amount of CHG as told. This is often one bottle. Do not smoke and stay away from flames after using CHG. Your skin may feel sticky after using CHG. This is normal. The sticky feeling will go away as the CHG dries. Do not use CHG: If you have a chlorhexidine  allergy or have reacted to chlorhexidine  in the past. On open wounds or areas of skin that have broken skin, cuts, or scrapes. On babies younger than 64 months of age. Contact a health care provider if: You have questions about using CHG. Your skin gets irritated or itchy. You have a rash after using CHG. You swallow any CHG. Call your local  poison control center [phone] in the [place] Your eyes itch badly, or they become very red or swollen. Your hearing changes. You have trouble seeing. If you can't reach your provider, go to an urgent care or emergency room. Do not drive yourself. Get help right away if: You have swelling or tingling in your mouth or throat. You make high-pitched  whistling sounds when you breathe, most often when you breathe out (wheeze). You have trouble breathing. These symptoms may be an emergency. Call 911 right away. Do not wait to see if the symptoms will go away. Do not drive yourself to the hospital. This information is not intended to replace advice given to you by your health care provider. Make sure you discuss any questions you have with your health care provider. Document Revised: 10/10/2022 Document Reviewed: 10/06/2021 Elsevier Patient Education  2024 [employer].       "

## 2024-05-19 ENCOUNTER — Inpatient Hospital Stay (HOSPITAL_COMMUNITY): Admission: RE | Admit: 2024-05-19 | Payer: MEDICAID | Source: Ambulatory Visit

## 2024-05-20 ENCOUNTER — Ambulatory Visit (HOSPITAL_COMMUNITY): Admission: RE | Admit: 2024-05-20 | Payer: MEDICAID | Admitting: Orthopedic Surgery

## 2024-05-20 ENCOUNTER — Encounter: Admission: RE | Payer: Self-pay
